# Patient Record
Sex: Female | Born: 1945 | Race: Black or African American | Hispanic: No | Marital: Single | Smoking: Never smoker
Health system: Southern US, Community
[De-identification: ages and names within clinical notes are randomized; demographics above are authoritative.]

## PROBLEM LIST (undated history)

## (undated) ENCOUNTER — Ambulatory Visit: Source: Home / Self Care

## (undated) DIAGNOSIS — I2089 Other forms of angina pectoris: Secondary | ICD-10-CM

## (undated) DIAGNOSIS — M797 Fibromyalgia: Secondary | ICD-10-CM

## (undated) DIAGNOSIS — IMO0001 Reserved for inherently not codable concepts without codable children: Secondary | ICD-10-CM

## (undated) DIAGNOSIS — T8859XA Other complications of anesthesia, initial encounter: Secondary | ICD-10-CM

## (undated) DIAGNOSIS — G8929 Other chronic pain: Secondary | ICD-10-CM

## (undated) DIAGNOSIS — E039 Hypothyroidism, unspecified: Secondary | ICD-10-CM

## (undated) DIAGNOSIS — E785 Hyperlipidemia, unspecified: Secondary | ICD-10-CM

## (undated) DIAGNOSIS — K279 Peptic ulcer, site unspecified, unspecified as acute or chronic, without hemorrhage or perforation: Secondary | ICD-10-CM

## (undated) DIAGNOSIS — K5790 Diverticulosis of intestine, part unspecified, without perforation or abscess without bleeding: Secondary | ICD-10-CM

## (undated) DIAGNOSIS — E119 Type 2 diabetes mellitus without complications: Secondary | ICD-10-CM

## (undated) DIAGNOSIS — M545 Low back pain, unspecified: Secondary | ICD-10-CM

## (undated) DIAGNOSIS — C50512 Malignant neoplasm of lower-outer quadrant of left female breast: Secondary | ICD-10-CM

## (undated) DIAGNOSIS — M940 Chondrocostal junction syndrome [Tietze]: Secondary | ICD-10-CM

## (undated) DIAGNOSIS — F419 Anxiety disorder, unspecified: Secondary | ICD-10-CM

## (undated) DIAGNOSIS — T4145XA Adverse effect of unspecified anesthetic, initial encounter: Secondary | ICD-10-CM

## (undated) DIAGNOSIS — Z9221 Personal history of antineoplastic chemotherapy: Secondary | ICD-10-CM

## (undated) DIAGNOSIS — F50819 Binge eating disorder, unspecified: Secondary | ICD-10-CM

## (undated) DIAGNOSIS — R7303 Prediabetes: Secondary | ICD-10-CM

## (undated) DIAGNOSIS — I208 Other forms of angina pectoris: Secondary | ICD-10-CM

## (undated) DIAGNOSIS — I10 Essential (primary) hypertension: Secondary | ICD-10-CM

## (undated) DIAGNOSIS — K219 Gastro-esophageal reflux disease without esophagitis: Secondary | ICD-10-CM

## (undated) DIAGNOSIS — F32A Depression, unspecified: Secondary | ICD-10-CM

## (undated) DIAGNOSIS — C50911 Malignant neoplasm of unspecified site of right female breast: Secondary | ICD-10-CM

## (undated) DIAGNOSIS — F5081 Binge eating disorder: Secondary | ICD-10-CM

## (undated) DIAGNOSIS — R232 Flushing: Secondary | ICD-10-CM

## (undated) DIAGNOSIS — M1990 Unspecified osteoarthritis, unspecified site: Secondary | ICD-10-CM

## (undated) DIAGNOSIS — J42 Unspecified chronic bronchitis: Secondary | ICD-10-CM

## (undated) DIAGNOSIS — F329 Major depressive disorder, single episode, unspecified: Secondary | ICD-10-CM

## (undated) HISTORY — DX: Major depressive disorder, single episode, unspecified: F32.9

## (undated) HISTORY — PX: CARDIAC CATHETERIZATION: SHX172

## (undated) HISTORY — PX: ABDOMINAL HYSTERECTOMY: SHX81

## (undated) HISTORY — DX: Malignant neoplasm of lower-outer quadrant of left female breast: C50.512

## (undated) HISTORY — PX: TONSILLECTOMY: SUR1361

## (undated) HISTORY — DX: Hypothyroidism, unspecified: E03.9

## (undated) HISTORY — DX: Peptic ulcer, site unspecified, unspecified as acute or chronic, without hemorrhage or perforation: K27.9

## (undated) HISTORY — DX: Binge eating disorder: F50.81

## (undated) HISTORY — DX: Fibromyalgia: M79.7

## (undated) HISTORY — DX: Depression, unspecified: F32.A

## (undated) HISTORY — DX: Unspecified osteoarthritis, unspecified site: M19.90

## (undated) HISTORY — DX: Binge eating disorder, unspecified: F50.819

## (undated) HISTORY — DX: Essential (primary) hypertension: I10

## (undated) HISTORY — PX: LAPAROSCOPIC CHOLECYSTECTOMY: SUR755

## (undated) HISTORY — PX: COLONOSCOPY: SHX174

## (undated) HISTORY — DX: Flushing: R23.2

## (undated) HISTORY — DX: Chondrocostal junction syndrome (tietze): M94.0

## (undated) HISTORY — PX: SHOULDER ARTHROSCOPY: SHX128

## (undated) HISTORY — DX: Gastro-esophageal reflux disease without esophagitis: K21.9

## (undated) HISTORY — DX: Other forms of angina pectoris: I20.89

## (undated) HISTORY — PX: CATARACT EXTRACTION, BILATERAL: SHX1313

## (undated) HISTORY — PX: DILATION AND CURETTAGE OF UTERUS: SHX78

## (undated) HISTORY — DX: Hyperlipidemia, unspecified: E78.5

## (undated) HISTORY — DX: Anxiety disorder, unspecified: F41.9

## (undated) HISTORY — PX: FRACTURE SURGERY: SHX138

## (undated) HISTORY — PX: CARPAL TUNNEL RELEASE: SHX101

## (undated) HISTORY — DX: Diverticulosis of intestine, part unspecified, without perforation or abscess without bleeding: K57.90

## (undated) HISTORY — PX: KNEE ARTHROSCOPY: SHX127

## (undated) HISTORY — DX: Other forms of angina pectoris: I20.8

---

## 1997-09-17 ENCOUNTER — Emergency Department (HOSPITAL_COMMUNITY): Admission: EM | Admit: 1997-09-17 | Discharge: 1997-09-17 | Payer: Self-pay | Admitting: Internal Medicine

## 1998-01-14 ENCOUNTER — Emergency Department (HOSPITAL_COMMUNITY): Admission: EM | Admit: 1998-01-14 | Discharge: 1998-01-15 | Payer: Self-pay | Admitting: Emergency Medicine

## 1998-01-15 ENCOUNTER — Encounter: Payer: Self-pay | Admitting: Emergency Medicine

## 1998-02-11 ENCOUNTER — Emergency Department (HOSPITAL_COMMUNITY): Admission: EM | Admit: 1998-02-11 | Discharge: 1998-02-11 | Payer: Self-pay | Admitting: Emergency Medicine

## 1998-02-15 ENCOUNTER — Encounter: Payer: Self-pay | Admitting: Emergency Medicine

## 1998-02-15 ENCOUNTER — Emergency Department (HOSPITAL_COMMUNITY): Admission: EM | Admit: 1998-02-15 | Discharge: 1998-02-15 | Payer: Self-pay | Admitting: Emergency Medicine

## 1998-06-22 ENCOUNTER — Emergency Department (HOSPITAL_COMMUNITY): Admission: EM | Admit: 1998-06-22 | Discharge: 1998-06-22 | Payer: Self-pay | Admitting: Emergency Medicine

## 1998-07-28 ENCOUNTER — Emergency Department (HOSPITAL_COMMUNITY): Admission: EM | Admit: 1998-07-28 | Discharge: 1998-07-29 | Payer: Self-pay | Admitting: Emergency Medicine

## 1998-07-29 ENCOUNTER — Encounter: Payer: Self-pay | Admitting: Emergency Medicine

## 1998-09-08 ENCOUNTER — Encounter: Payer: Self-pay | Admitting: Cardiovascular Disease

## 1998-09-08 ENCOUNTER — Inpatient Hospital Stay (HOSPITAL_COMMUNITY): Admission: EM | Admit: 1998-09-08 | Discharge: 1998-09-09 | Payer: Self-pay | Admitting: Emergency Medicine

## 1998-11-15 ENCOUNTER — Encounter: Payer: Self-pay | Admitting: Emergency Medicine

## 1998-11-15 ENCOUNTER — Inpatient Hospital Stay (HOSPITAL_COMMUNITY): Admission: EM | Admit: 1998-11-15 | Discharge: 1998-11-17 | Payer: Self-pay | Admitting: Emergency Medicine

## 1999-03-25 ENCOUNTER — Emergency Department (HOSPITAL_COMMUNITY): Admission: EM | Admit: 1999-03-25 | Discharge: 1999-03-26 | Payer: Self-pay | Admitting: Emergency Medicine

## 1999-03-25 ENCOUNTER — Encounter: Payer: Self-pay | Admitting: Emergency Medicine

## 1999-03-29 ENCOUNTER — Emergency Department (HOSPITAL_COMMUNITY): Admission: EM | Admit: 1999-03-29 | Discharge: 1999-03-29 | Payer: Self-pay

## 1999-04-12 ENCOUNTER — Emergency Department (HOSPITAL_COMMUNITY): Admission: EM | Admit: 1999-04-12 | Discharge: 1999-04-12 | Payer: Self-pay

## 1999-05-27 ENCOUNTER — Encounter: Payer: Self-pay | Admitting: Internal Medicine

## 1999-05-27 ENCOUNTER — Ambulatory Visit (HOSPITAL_COMMUNITY): Admission: RE | Admit: 1999-05-27 | Discharge: 1999-05-27 | Payer: Self-pay | Admitting: Internal Medicine

## 1999-07-06 ENCOUNTER — Emergency Department (HOSPITAL_COMMUNITY): Admission: EM | Admit: 1999-07-06 | Discharge: 1999-07-06 | Payer: Self-pay

## 1999-07-31 ENCOUNTER — Encounter: Payer: Self-pay | Admitting: Emergency Medicine

## 1999-08-01 ENCOUNTER — Observation Stay (HOSPITAL_COMMUNITY): Admission: EM | Admit: 1999-08-01 | Discharge: 1999-08-02 | Payer: Self-pay | Admitting: Cardiovascular Disease

## 1999-09-26 ENCOUNTER — Emergency Department (HOSPITAL_COMMUNITY): Admission: EM | Admit: 1999-09-26 | Discharge: 1999-09-26 | Payer: Self-pay | Admitting: Emergency Medicine

## 1999-11-21 ENCOUNTER — Emergency Department (HOSPITAL_COMMUNITY): Admission: EM | Admit: 1999-11-21 | Discharge: 1999-11-21 | Payer: Self-pay | Admitting: Emergency Medicine

## 1999-11-21 ENCOUNTER — Encounter: Payer: Self-pay | Admitting: Emergency Medicine

## 1999-12-14 ENCOUNTER — Encounter: Payer: Self-pay | Admitting: Internal Medicine

## 1999-12-14 ENCOUNTER — Emergency Department (HOSPITAL_COMMUNITY): Admission: EM | Admit: 1999-12-14 | Discharge: 1999-12-15 | Payer: Self-pay | Admitting: Emergency Medicine

## 2000-03-07 ENCOUNTER — Observation Stay (HOSPITAL_COMMUNITY): Admission: EM | Admit: 2000-03-07 | Discharge: 2000-03-08 | Payer: Self-pay | Admitting: Emergency Medicine

## 2000-03-07 ENCOUNTER — Encounter: Payer: Self-pay | Admitting: Emergency Medicine

## 2000-03-08 ENCOUNTER — Encounter: Payer: Self-pay | Admitting: Cardiology

## 2000-03-22 ENCOUNTER — Ambulatory Visit (HOSPITAL_COMMUNITY): Admission: RE | Admit: 2000-03-22 | Discharge: 2000-03-22 | Payer: Self-pay | Admitting: Cardiology

## 2000-03-22 ENCOUNTER — Encounter: Payer: Self-pay | Admitting: Cardiology

## 2000-04-08 ENCOUNTER — Encounter: Payer: Self-pay | Admitting: Emergency Medicine

## 2000-04-08 ENCOUNTER — Emergency Department (HOSPITAL_COMMUNITY): Admission: EM | Admit: 2000-04-08 | Discharge: 2000-04-08 | Payer: Self-pay | Admitting: Emergency Medicine

## 2000-04-24 ENCOUNTER — Emergency Department (HOSPITAL_COMMUNITY): Admission: EM | Admit: 2000-04-24 | Discharge: 2000-04-24 | Payer: Self-pay | Admitting: Emergency Medicine

## 2000-05-04 ENCOUNTER — Emergency Department (HOSPITAL_COMMUNITY): Admission: EM | Admit: 2000-05-04 | Discharge: 2000-05-05 | Payer: Self-pay | Admitting: Emergency Medicine

## 2000-05-05 ENCOUNTER — Encounter: Payer: Self-pay | Admitting: Emergency Medicine

## 2000-05-07 ENCOUNTER — Inpatient Hospital Stay (HOSPITAL_COMMUNITY): Admission: EM | Admit: 2000-05-07 | Discharge: 2000-05-08 | Payer: Self-pay | Admitting: Emergency Medicine

## 2000-05-07 ENCOUNTER — Encounter (INDEPENDENT_AMBULATORY_CARE_PROVIDER_SITE_OTHER): Payer: Self-pay | Admitting: Specialist

## 2000-05-07 ENCOUNTER — Encounter (HOSPITAL_BASED_OUTPATIENT_CLINIC_OR_DEPARTMENT_OTHER): Payer: Self-pay | Admitting: General Surgery

## 2000-05-29 ENCOUNTER — Encounter: Payer: Self-pay | Admitting: Emergency Medicine

## 2000-05-29 ENCOUNTER — Emergency Department (HOSPITAL_COMMUNITY): Admission: EM | Admit: 2000-05-29 | Discharge: 2000-05-29 | Payer: Self-pay | Admitting: *Deleted

## 2000-06-13 ENCOUNTER — Encounter: Payer: Self-pay | Admitting: Emergency Medicine

## 2000-06-13 ENCOUNTER — Emergency Department (HOSPITAL_COMMUNITY): Admission: EM | Admit: 2000-06-13 | Discharge: 2000-06-13 | Payer: Self-pay | Admitting: Emergency Medicine

## 2000-07-10 ENCOUNTER — Encounter: Payer: Self-pay | Admitting: Emergency Medicine

## 2000-07-10 ENCOUNTER — Emergency Department (HOSPITAL_COMMUNITY): Admission: EM | Admit: 2000-07-10 | Discharge: 2000-07-10 | Payer: Self-pay | Admitting: Emergency Medicine

## 2000-08-09 ENCOUNTER — Inpatient Hospital Stay (HOSPITAL_COMMUNITY): Admission: EM | Admit: 2000-08-09 | Discharge: 2000-08-11 | Payer: Self-pay | Admitting: Emergency Medicine

## 2000-08-09 ENCOUNTER — Encounter: Payer: Self-pay | Admitting: Emergency Medicine

## 2000-10-17 ENCOUNTER — Emergency Department (HOSPITAL_COMMUNITY): Admission: EM | Admit: 2000-10-17 | Discharge: 2000-10-18 | Payer: Self-pay | Admitting: Emergency Medicine

## 2000-10-27 ENCOUNTER — Emergency Department (HOSPITAL_COMMUNITY): Admission: EM | Admit: 2000-10-27 | Discharge: 2000-10-28 | Payer: Self-pay | Admitting: Emergency Medicine

## 2000-10-27 ENCOUNTER — Encounter: Payer: Self-pay | Admitting: Emergency Medicine

## 2000-12-12 ENCOUNTER — Emergency Department (HOSPITAL_COMMUNITY): Admission: EM | Admit: 2000-12-12 | Discharge: 2000-12-12 | Payer: Self-pay | Admitting: Emergency Medicine

## 2001-01-05 ENCOUNTER — Emergency Department (HOSPITAL_COMMUNITY): Admission: EM | Admit: 2001-01-05 | Discharge: 2001-01-06 | Payer: Self-pay | Admitting: Emergency Medicine

## 2001-01-16 ENCOUNTER — Emergency Department (HOSPITAL_COMMUNITY): Admission: EM | Admit: 2001-01-16 | Discharge: 2001-01-16 | Payer: Self-pay | Admitting: Emergency Medicine

## 2001-01-27 ENCOUNTER — Emergency Department (HOSPITAL_COMMUNITY): Admission: EM | Admit: 2001-01-27 | Discharge: 2001-01-27 | Payer: Self-pay | Admitting: Emergency Medicine

## 2001-02-24 ENCOUNTER — Encounter: Payer: Self-pay | Admitting: Emergency Medicine

## 2001-02-24 ENCOUNTER — Emergency Department (HOSPITAL_COMMUNITY): Admission: EM | Admit: 2001-02-24 | Discharge: 2001-02-24 | Payer: Self-pay | Admitting: Emergency Medicine

## 2001-03-23 ENCOUNTER — Observation Stay (HOSPITAL_COMMUNITY): Admission: EM | Admit: 2001-03-23 | Discharge: 2001-03-24 | Payer: Self-pay | Admitting: Emergency Medicine

## 2001-03-23 ENCOUNTER — Encounter: Payer: Self-pay | Admitting: Emergency Medicine

## 2001-07-03 ENCOUNTER — Encounter: Payer: Self-pay | Admitting: Emergency Medicine

## 2001-07-03 ENCOUNTER — Emergency Department (HOSPITAL_COMMUNITY): Admission: EM | Admit: 2001-07-03 | Discharge: 2001-07-03 | Payer: Self-pay | Admitting: Emergency Medicine

## 2001-07-08 ENCOUNTER — Emergency Department (HOSPITAL_COMMUNITY): Admission: EM | Admit: 2001-07-08 | Discharge: 2001-07-09 | Payer: Self-pay | Admitting: Emergency Medicine

## 2001-07-09 ENCOUNTER — Encounter: Payer: Self-pay | Admitting: Emergency Medicine

## 2001-12-09 ENCOUNTER — Emergency Department (HOSPITAL_COMMUNITY): Admission: EM | Admit: 2001-12-09 | Discharge: 2001-12-09 | Payer: Self-pay | Admitting: Emergency Medicine

## 2001-12-09 ENCOUNTER — Encounter: Payer: Self-pay | Admitting: Emergency Medicine

## 2002-03-09 ENCOUNTER — Emergency Department (HOSPITAL_COMMUNITY): Admission: EM | Admit: 2002-03-09 | Discharge: 2002-03-09 | Payer: Self-pay | Admitting: Emergency Medicine

## 2002-03-14 ENCOUNTER — Emergency Department (HOSPITAL_COMMUNITY): Admission: EM | Admit: 2002-03-14 | Discharge: 2002-03-14 | Payer: Self-pay | Admitting: *Deleted

## 2002-03-30 ENCOUNTER — Inpatient Hospital Stay (HOSPITAL_COMMUNITY): Admission: EM | Admit: 2002-03-30 | Discharge: 2002-04-01 | Payer: Self-pay | Admitting: Psychiatry

## 2002-06-09 ENCOUNTER — Emergency Department (HOSPITAL_COMMUNITY): Admission: EM | Admit: 2002-06-09 | Discharge: 2002-06-09 | Payer: Self-pay | Admitting: Emergency Medicine

## 2002-06-09 ENCOUNTER — Encounter: Payer: Self-pay | Admitting: Emergency Medicine

## 2002-06-29 ENCOUNTER — Emergency Department (HOSPITAL_COMMUNITY): Admission: EM | Admit: 2002-06-29 | Discharge: 2002-06-29 | Payer: Self-pay | Admitting: Emergency Medicine

## 2002-06-29 ENCOUNTER — Encounter: Payer: Self-pay | Admitting: Emergency Medicine

## 2002-08-04 ENCOUNTER — Emergency Department (HOSPITAL_COMMUNITY): Admission: EM | Admit: 2002-08-04 | Discharge: 2002-08-04 | Payer: Self-pay

## 2002-09-05 ENCOUNTER — Encounter: Payer: Self-pay | Admitting: Pediatrics

## 2002-09-05 ENCOUNTER — Emergency Department (HOSPITAL_COMMUNITY): Admission: EM | Admit: 2002-09-05 | Discharge: 2002-09-05 | Payer: Self-pay | Admitting: Emergency Medicine

## 2002-11-25 ENCOUNTER — Emergency Department (HOSPITAL_COMMUNITY): Admission: EM | Admit: 2002-11-25 | Discharge: 2002-11-25 | Payer: Self-pay | Admitting: Emergency Medicine

## 2003-01-20 ENCOUNTER — Encounter: Payer: Self-pay | Admitting: Cardiology

## 2003-01-20 ENCOUNTER — Ambulatory Visit (HOSPITAL_COMMUNITY): Admission: RE | Admit: 2003-01-20 | Discharge: 2003-01-20 | Payer: Self-pay | Admitting: Cardiology

## 2003-01-28 ENCOUNTER — Ambulatory Visit (HOSPITAL_COMMUNITY): Admission: RE | Admit: 2003-01-28 | Discharge: 2003-01-28 | Payer: Self-pay | Admitting: Cardiology

## 2003-05-09 ENCOUNTER — Emergency Department (HOSPITAL_COMMUNITY): Admission: EM | Admit: 2003-05-09 | Discharge: 2003-05-09 | Payer: Self-pay | Admitting: Emergency Medicine

## 2003-06-19 ENCOUNTER — Emergency Department (HOSPITAL_COMMUNITY): Admission: EM | Admit: 2003-06-19 | Discharge: 2003-06-19 | Payer: Self-pay | Admitting: Emergency Medicine

## 2003-08-08 ENCOUNTER — Emergency Department (HOSPITAL_COMMUNITY): Admission: EM | Admit: 2003-08-08 | Discharge: 2003-08-08 | Payer: Self-pay | Admitting: Emergency Medicine

## 2003-08-10 ENCOUNTER — Emergency Department (HOSPITAL_COMMUNITY): Admission: EM | Admit: 2003-08-10 | Discharge: 2003-08-10 | Payer: Self-pay | Admitting: Family Medicine

## 2003-09-24 ENCOUNTER — Emergency Department (HOSPITAL_COMMUNITY): Admission: EM | Admit: 2003-09-24 | Discharge: 2003-09-24 | Payer: Self-pay | Admitting: Family Medicine

## 2004-03-07 ENCOUNTER — Emergency Department (HOSPITAL_COMMUNITY): Admission: EM | Admit: 2004-03-07 | Discharge: 2004-03-07 | Payer: Self-pay | Admitting: Family Medicine

## 2004-04-17 ENCOUNTER — Emergency Department (HOSPITAL_COMMUNITY): Admission: EM | Admit: 2004-04-17 | Discharge: 2004-04-17 | Payer: Self-pay | Admitting: Family Medicine

## 2004-05-14 ENCOUNTER — Emergency Department (HOSPITAL_COMMUNITY): Admission: EM | Admit: 2004-05-14 | Discharge: 2004-05-14 | Payer: Self-pay | Admitting: Emergency Medicine

## 2004-05-16 ENCOUNTER — Emergency Department (HOSPITAL_COMMUNITY): Admission: EM | Admit: 2004-05-16 | Discharge: 2004-05-16 | Payer: Self-pay | Admitting: Family Medicine

## 2004-05-20 ENCOUNTER — Emergency Department (HOSPITAL_COMMUNITY): Admission: EM | Admit: 2004-05-20 | Discharge: 2004-05-21 | Payer: Self-pay | Admitting: Emergency Medicine

## 2004-06-22 ENCOUNTER — Emergency Department (HOSPITAL_COMMUNITY): Admission: EM | Admit: 2004-06-22 | Discharge: 2004-06-22 | Payer: Self-pay | Admitting: Family Medicine

## 2004-07-20 ENCOUNTER — Encounter: Admission: RE | Admit: 2004-07-20 | Discharge: 2004-07-20 | Payer: Self-pay | Admitting: Internal Medicine

## 2004-12-13 ENCOUNTER — Emergency Department (HOSPITAL_COMMUNITY): Admission: EM | Admit: 2004-12-13 | Discharge: 2004-12-14 | Payer: Self-pay | Admitting: Emergency Medicine

## 2005-02-16 ENCOUNTER — Emergency Department (HOSPITAL_COMMUNITY): Admission: EM | Admit: 2005-02-16 | Discharge: 2005-02-16 | Payer: Self-pay | Admitting: Emergency Medicine

## 2005-06-27 ENCOUNTER — Emergency Department (HOSPITAL_COMMUNITY): Admission: EM | Admit: 2005-06-27 | Discharge: 2005-06-28 | Payer: Self-pay | Admitting: Emergency Medicine

## 2005-07-16 ENCOUNTER — Emergency Department (HOSPITAL_COMMUNITY): Admission: EM | Admit: 2005-07-16 | Discharge: 2005-07-16 | Payer: Self-pay | Admitting: Emergency Medicine

## 2005-08-07 ENCOUNTER — Ambulatory Visit (HOSPITAL_COMMUNITY): Admission: RE | Admit: 2005-08-07 | Discharge: 2005-08-07 | Payer: Self-pay

## 2005-08-14 ENCOUNTER — Ambulatory Visit (HOSPITAL_COMMUNITY): Admission: RE | Admit: 2005-08-14 | Discharge: 2005-08-14 | Payer: Self-pay | Admitting: Anesthesiology

## 2006-02-22 ENCOUNTER — Encounter: Admission: RE | Admit: 2006-02-22 | Discharge: 2006-02-22 | Payer: Self-pay | Admitting: Internal Medicine

## 2006-05-20 ENCOUNTER — Emergency Department (HOSPITAL_COMMUNITY): Admission: EM | Admit: 2006-05-20 | Discharge: 2006-05-20 | Payer: Self-pay | Admitting: Emergency Medicine

## 2006-09-13 ENCOUNTER — Ambulatory Visit (HOSPITAL_COMMUNITY): Admission: RE | Admit: 2006-09-13 | Discharge: 2006-09-13 | Payer: Self-pay | Admitting: Cardiology

## 2006-10-22 ENCOUNTER — Emergency Department (HOSPITAL_COMMUNITY): Admission: EM | Admit: 2006-10-22 | Discharge: 2006-10-22 | Payer: Self-pay | Admitting: Emergency Medicine

## 2006-10-23 ENCOUNTER — Emergency Department (HOSPITAL_COMMUNITY): Admission: EM | Admit: 2006-10-23 | Discharge: 2006-10-23 | Payer: Self-pay | Admitting: Emergency Medicine

## 2007-02-28 ENCOUNTER — Ambulatory Visit (HOSPITAL_COMMUNITY): Admission: RE | Admit: 2007-02-28 | Discharge: 2007-02-28 | Payer: Self-pay | Admitting: Anesthesiology

## 2007-04-10 HISTORY — PX: ANKLE FRACTURE SURGERY: SHX122

## 2007-04-13 ENCOUNTER — Emergency Department (HOSPITAL_COMMUNITY): Admission: EM | Admit: 2007-04-13 | Discharge: 2007-04-13 | Payer: Self-pay | Admitting: *Deleted

## 2007-07-24 ENCOUNTER — Emergency Department (HOSPITAL_COMMUNITY): Admission: EM | Admit: 2007-07-24 | Discharge: 2007-07-24 | Payer: Self-pay | Admitting: Emergency Medicine

## 2007-12-15 ENCOUNTER — Emergency Department (HOSPITAL_COMMUNITY): Admission: EM | Admit: 2007-12-15 | Discharge: 2007-12-16 | Payer: Self-pay | Admitting: *Deleted

## 2008-07-24 ENCOUNTER — Emergency Department (HOSPITAL_COMMUNITY): Admission: EM | Admit: 2008-07-24 | Discharge: 2008-07-24 | Payer: Self-pay | Admitting: Emergency Medicine

## 2008-09-24 ENCOUNTER — Emergency Department (HOSPITAL_COMMUNITY): Admission: EM | Admit: 2008-09-24 | Discharge: 2008-09-24 | Payer: Self-pay | Admitting: Family Medicine

## 2008-11-11 ENCOUNTER — Emergency Department (HOSPITAL_COMMUNITY): Admission: EM | Admit: 2008-11-11 | Discharge: 2008-11-11 | Payer: Self-pay | Admitting: Emergency Medicine

## 2009-02-10 ENCOUNTER — Emergency Department (HOSPITAL_COMMUNITY): Admission: EM | Admit: 2009-02-10 | Discharge: 2009-02-10 | Payer: Self-pay | Admitting: Emergency Medicine

## 2009-02-14 ENCOUNTER — Emergency Department (HOSPITAL_COMMUNITY): Admission: EM | Admit: 2009-02-14 | Discharge: 2009-02-14 | Payer: Self-pay | Admitting: Emergency Medicine

## 2009-04-11 ENCOUNTER — Emergency Department (HOSPITAL_COMMUNITY): Admission: EM | Admit: 2009-04-11 | Discharge: 2009-04-12 | Payer: Self-pay | Admitting: Emergency Medicine

## 2009-05-14 ENCOUNTER — Emergency Department (HOSPITAL_COMMUNITY): Admission: EM | Admit: 2009-05-14 | Discharge: 2009-05-15 | Payer: Self-pay | Admitting: Emergency Medicine

## 2009-11-07 ENCOUNTER — Inpatient Hospital Stay (HOSPITAL_COMMUNITY): Admission: EM | Admit: 2009-11-07 | Discharge: 2009-11-15 | Payer: Self-pay | Admitting: Emergency Medicine

## 2009-11-07 ENCOUNTER — Ambulatory Visit: Payer: Self-pay | Admitting: Internal Medicine

## 2010-01-19 ENCOUNTER — Ambulatory Visit: Payer: Self-pay | Admitting: Internal Medicine

## 2010-01-19 DIAGNOSIS — M1990 Unspecified osteoarthritis, unspecified site: Secondary | ICD-10-CM | POA: Insufficient documentation

## 2010-01-19 DIAGNOSIS — E785 Hyperlipidemia, unspecified: Secondary | ICD-10-CM

## 2010-01-19 DIAGNOSIS — F329 Major depressive disorder, single episode, unspecified: Secondary | ICD-10-CM

## 2010-01-19 DIAGNOSIS — K219 Gastro-esophageal reflux disease without esophagitis: Secondary | ICD-10-CM

## 2010-01-19 DIAGNOSIS — K279 Peptic ulcer, site unspecified, unspecified as acute or chronic, without hemorrhage or perforation: Secondary | ICD-10-CM | POA: Insufficient documentation

## 2010-01-19 DIAGNOSIS — I10 Essential (primary) hypertension: Secondary | ICD-10-CM | POA: Insufficient documentation

## 2010-01-19 DIAGNOSIS — E1169 Type 2 diabetes mellitus with other specified complication: Secondary | ICD-10-CM

## 2010-01-19 DIAGNOSIS — E039 Hypothyroidism, unspecified: Secondary | ICD-10-CM

## 2010-01-19 DIAGNOSIS — M545 Low back pain: Secondary | ICD-10-CM | POA: Insufficient documentation

## 2010-01-19 LAB — CONVERTED CEMR LAB
ALT: 21 units/L (ref 0–35)
AST: 25 units/L (ref 0–37)
Alkaline Phosphatase: 108 units/L (ref 39–117)
BUN: 11 mg/dL (ref 6–23)
Basophils Relative: 0.6 % (ref 0.0–3.0)
CO2: 30 meq/L (ref 19–32)
Calcium: 10.1 mg/dL (ref 8.4–10.5)
Chloride: 106 meq/L (ref 96–112)
Cholesterol: 113 mg/dL (ref 0–200)
Creatinine, Ser: 0.9 mg/dL (ref 0.4–1.2)
Eosinophils Relative: 2.2 % (ref 0.0–5.0)
GFR calc non Af Amer: 80.04 mL/min (ref >60)
Glucose, Bld: 106 mg/dL — ABNORMAL HIGH (ref 70–99)
Lymphocytes Relative: 49.2 % — ABNORMAL HIGH (ref 12.0–46.0)
MCV: 94.1 fL (ref 78.0–100.0)
Monocytes Relative: 6.6 % (ref 3.0–12.0)
Neutrophils Relative %: 41.4 % — ABNORMAL LOW (ref 43.0–77.0)
Platelets: 318 10*3/uL (ref 150.0–400.0)
Potassium: 5.3 meq/L — ABNORMAL HIGH (ref 3.5–5.1)
RDW: 14.5 % (ref 11.5–14.6)
Total Protein: 7 g/dL (ref 6.0–8.3)

## 2010-04-04 ENCOUNTER — Ambulatory Visit
Admission: RE | Admit: 2010-04-04 | Discharge: 2010-04-04 | Payer: Self-pay | Source: Home / Self Care | Attending: Family Medicine | Admitting: Family Medicine

## 2010-04-04 ENCOUNTER — Ambulatory Visit: Admission: RE | Admit: 2010-04-04 | Payer: Self-pay | Source: Home / Self Care | Admitting: Family Medicine

## 2010-04-04 DIAGNOSIS — J019 Acute sinusitis, unspecified: Secondary | ICD-10-CM | POA: Insufficient documentation

## 2010-04-11 ENCOUNTER — Ambulatory Visit
Admission: RE | Admit: 2010-04-11 | Discharge: 2010-04-11 | Payer: Self-pay | Source: Home / Self Care | Attending: Family Medicine | Admitting: Family Medicine

## 2010-04-11 DIAGNOSIS — J45909 Unspecified asthma, uncomplicated: Secondary | ICD-10-CM | POA: Insufficient documentation

## 2010-04-11 DIAGNOSIS — J45998 Other asthma: Secondary | ICD-10-CM | POA: Insufficient documentation

## 2010-04-11 DIAGNOSIS — R42 Dizziness and giddiness: Secondary | ICD-10-CM | POA: Insufficient documentation

## 2010-04-20 ENCOUNTER — Ambulatory Visit: Admit: 2010-04-20 | Payer: Self-pay | Admitting: Internal Medicine

## 2010-04-24 ENCOUNTER — Encounter: Payer: Self-pay | Admitting: Gastroenterology

## 2010-04-25 ENCOUNTER — Ambulatory Visit
Admission: RE | Admit: 2010-04-25 | Discharge: 2010-04-25 | Payer: Self-pay | Source: Home / Self Care | Attending: Family Medicine | Admitting: Family Medicine

## 2010-04-25 DIAGNOSIS — N39498 Other specified urinary incontinence: Secondary | ICD-10-CM | POA: Insufficient documentation

## 2010-04-28 ENCOUNTER — Telehealth: Payer: Self-pay | Admitting: Gastroenterology

## 2010-05-01 ENCOUNTER — Encounter
Admission: RE | Admit: 2010-05-01 | Discharge: 2010-05-01 | Payer: Self-pay | Source: Home / Self Care | Attending: Family Medicine | Admitting: Family Medicine

## 2010-05-09 NOTE — Assessment & Plan Note (Signed)
Summary: to be est/njr   Vital Signs:  Patient profile:   65 year old female Weight:      218 pounds Temp:     98.1 degrees F oral BP sitting:   110 / 80  (right arm) Cuff size:   regular  Vitals Entered By: Duard Brady LPN (January 19, 2010 1:08 PM) CC: new to establish - needs PCP - doing ok Is Patient Diabetic? No   CC:  new to establish - needs PCP - doing ok.  History of Present Illness: 65 year old patient who is seen today to establish with our practice.  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  She has treated hypertension, dyslipidemia, and a history of depression.  She has gastroesophageal reflux disease and hypothyroidism.  She is followed by rheumatology due to fibromyalgia.  Medical regimen includes oxycodone. Family history is positive in first degree relatives for colon cancer and breast cancer, but she has had no recent screening tests Here for Medicare AWV:  1.   Risk factors based on Past M, S, F history:  cardiovascular risk factors include hypertension, and dyslipidemia 2.   Physical Activities: partially limited due to a fracture, right ankle 3.   Depression/mood: history of major depression, followed by psychiatry 4.   Hearing: no deficits 5.   ADL's: independent in all aspects of daily living 6.   Fall Risk: moderate 7.   Home Safety: no proms identified 8.   Height, weight, &visual acuity:height weight, in visual acuity, stable 9.   Counseling:screening colonoscopy and mammogram.  Encouraged 10.   Labs ordered based on risk factors: will perform screening lab today 11.           Referral Coordination- followed cardiology rheumatology, and psychiatry 12.           Care Plan- heart healthy diet regular exercise and modest weight loss.  All encouraged 13.            Cognitive Assessment-  alert and oriented, with normal affect.  No history of memory disturbance.   Able to handle all executive functioning   Preventive Screening-Counseling & Management  Alcohol-Tobacco     Smoking Status: never  Allergies (verified): 1)  ! Codeine 2)  ! 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 father died in his 89s, complications of alcoholism, phenol, dementia.  Apparent history of colon cancer mother died age 65.  Neurovascular disease, senile dementia of the Alzheimer's type;  died of complications of diverticular disease in a nursing home   two brother- One history of alcoholism and deceased 4 sisters, one deceased of breast cancer  Social History: Reviewed history and no changes required. Divorced Never Smoked 3 children, one grandchild presently  lives with her adult daughter, who has OCD Smoking Status:  never  Review of Systems       The patient complains of weight gain, muscle weakness, difficulty walking, depression, and unusual weight change.  The patient denies anorexia, fever, weight loss, vision loss, decreased hearing, hoarseness, chest pain, syncope, dyspnea on exertion, peripheral edema, prolonged cough, headaches, hemoptysis, abdominal pain, melena, hematochezia, severe indigestion/heartburn, hematuria, incontinence, genital sores, suspicious skin lesions, transient blindness, abnormal  bleeding, enlarged lymph nodes, angioedema, and breast masses.    Physical Exam  General:  overweight-appearing.  120/80 Head:  Normocephalic and atraumatic without obvious abnormalities. No apparent alopecia or balding. Eyes:  No corneal or conjunctival inflammation noted. EOMI. Perrla. Funduscopic exam benign,  without hemorrhages, exudates or papilledema. Vision grossly normal. Ears:  External ear exam shows no significant lesions or deformities.  Otoscopic examination reveals clear canals, tympanic membranes are intact bilaterally without bulging, retraction, inflammation or discharge. Hearing is grossly normal bilaterally. Nose:  External nasal examination shows no deformity or inflammation. Nasal mucosa are pink and moist without lesions or exudates. Mouth:  Oral mucosa and oropharynx without lesions or exudates.  Teeth in good repair. Neck:  No deformities, masses, or tenderness noted. Chest Wall:  No deformities, masses, or tenderness noted. Lungs:  Normal respiratory effort, chest expands symmetrically. Lungs are clear to auscultation, no crackles or wheezes. Heart:  Normal rate and regular rhythm. S1 and S2 normal without gallop, murmur, click, rub or other extra sounds. Abdomen:  obese soft, nontender, no organomegaly Msk:  No deformity or scoliosis noted of thoracic or lumbar spine.   Pulses:  left pedal pulses intact Extremities:  right lower leg and ankle  in  a soft cast  Skin:  Intact without suspicious lesions or rashes Cervical Nodes:  No lymphadenopathy noted Axillary Nodes:  No palpable lymphadenopathy Psych:  Cognition and judgment appear intact. Alert and cooperative with normal attention span and concentration. No apparent delusions, illusions, hallucinations   Impression & Recommendations:  Problem # 1:  PREVENTIVE HEALTH CARE (ICD-V70.0)  Orders: Medicare -1st Annual Wellness Visit [phone])  Problem # 2:  OSTEOARTHRITIS (ICD-715.90)  Her updated medication list for this problem includes:    Oxycodone-acetaminophen 5-325 Mg Tabs (Oxycodone-acetaminophen) .Marland Kitchen... 1 by mouth q6hrs as needed pain    Aspir-low 81 Mg Tbec (Aspirin) ..... Qd  Orders: TLB-BMP (Basic Metabolic Panel-BMET) (80048-METABOL) TLB-CBC Platelet - w/Differential (85025-CBCD) TLB-Hepatic/Liver Function  Pnl (80076-HEPATIC) Specimen Handling (63016)  Problem # 3:  HYPOTHYROIDISM (ICD-244.9)  Her updated medication list for this problem includes:    Levothroid 50 Mcg Tabs (Levothyroxine sodium) ..... One daily  Orders: Venipuncture (01093) TLB-BMP (Basic Metabolic Panel-BMET) (80048-METABOL) TLB-CBC Platelet - w/Differential (85025-CBCD) TLB-Hepatic/Liver Function Pnl (80076-HEPATIC) TLB-TSH (Thyroid Stimulating Hormone) (84443-TSH) Specimen Handling (23557)  Problem # 4:  DEPRESSION (ICD-311)  Her updated medication list for this problem includes:    Xanax 0.5 Mg Tabs (Alprazolam) .Marland Kitchen... 1 bid    Citalopram Hydrobromide 20 Mg Tabs (Citalopram hydrobromide) .Marland Kitchen... 1 by mouth two times a day  Problem # 5:  HYPERTENSION (ICD-401.9)  Her updated medication list for this problem includes:    Exforge 10-320 Mg Tabs (Amlodipine besylate-valsartan) .Marland Kitchen... 1/2 qam  Orders: TLB-BMP (Basic Metabolic Panel-BMET) (80048-METABOL) TLB-CBC Platelet - w/Differential (85025-CBCD) TLB-Hepatic/Liver Function Pnl (80076-HEPATIC) Specimen Handling (32202)  Problem # 6:  HYPERLIPIDEMIA (ICD-272.4)  Her updated medication list for this problem includes:    Lipitor 40 Mg Tabs (Atorvastatin calcium) .Marland Kitchen... 1/2 qhs  Orders: TLB-Cholesterol, Total (82465-CHO) Specimen Handling (54270)  Complete Medication List: 1)  Exforge 10-320 Mg Tabs (Amlodipine besylate-valsartan) .... 1/2 qam 2)  Metroprolol 25mg   .... 1 by by mouth bid 3)  Lipitor 40 Mg Tabs (Atorvastatin calcium) .... 1/2 qhs 4)  Xanax 0.5 Mg Tabs (Alprazolam) .Marland Kitchen.. 1 bid 5)  Lamotrigine 100 Mg Tabs (Lamotrigine) .Marland Kitchen.. 1 by mouth qhs 6)  Citalopram Hydrobromide 20 Mg Tabs (Citalopram hydrobromide) .Marland Kitchen.. 1 by mouth two times a day  7)  Oxycodone-acetaminophen 5-325 Mg Tabs (Oxycodone-acetaminophen) .Marland Kitchen.. 1 by mouth q6hrs as needed pain 8)  Flexeril 5 Mg Tabs (Cyclobenzaprine hcl) .Marland Kitchen.. 1 by mouth two times a day as needed mucsle spasms 9)   Aspir-low 81 Mg Tbec (Aspirin) .... Qd 10)  Nitrostat 0.4 Mg Subl (Nitroglycerin) .... As needed chest pain 11)  Levothroid 50 Mcg Tabs (Levothyroxine sodium) .... One daily  Patient Instructions: 1)  Please schedule a follow-up appointment in 3 months. 2)  Limit your Sodium (Salt). 3)  It is important that you exercise regularly at least 20 minutes 5 times a week. If you develop chest pain, have severe difficulty breathing, or feel very tired , stop exercising immediately and seek medical attention. 4)  You need to lose weight. Consider a lower calorie diet and regular exercise.  5)  Schedule your mammogram. 6)  Schedule a colonoscopy/sigmoidoscopy to help detect colon cancer. 7)  Take an Aspirin every day. 8)  Check your Blood Pressure regularly. If it is above: 150/90 you should make an appointment. Prescriptions: LEVOTHROID 50 MCG TABS (LEVOTHYROXINE SODIUM) one daily  #90 x 6   Entered and Authorized by:   Gordy Savers  MD   Signed by:   Gordy Savers  MD on 01/19/2010   Method used:   Print then Give to Patient   RxID:   [id number] NITROSTAT 0.4 MG SUBL (NITROGLYCERIN) as needed chest pain  #bottle x 6   Entered and Authorized by:   Gordy Savers  MD   Signed by:   Gordy Savers  MD on 01/19/2010   Method used:   Print then Give to Patient   RxID:   [id number] LIPITOR 40 MG TABS (ATORVASTATIN CALCIUM) 1/2 qhs  #90 x 5   Entered and Authorized by:   Gordy Savers  MD   Signed by:   Gordy Savers  MD on 01/19/2010   Method used:   Print then Give to Patient   RxID:   [id number] METROPROLOL 25MG  1 by by mouth bid  #180 x 4   Entered and Authorized by:   Gordy Savers  MD   Signed by:   Gordy Savers  MD on 01/19/2010   Method used:   Print then Give to Patient   RxID:   [id number] EXFORGE 10-320 MG TABS (AMLODIPINE BESYLATE-VALSARTAN) 1/2 qAM  #90 x 4   Entered and Authorized by:   Gordy Savers  MD    Signed by:   Gordy Savers  MD on 01/19/2010   Method used:   Print then Give to Patient   RxID:   [id number]

## 2010-05-09 NOTE — Progress Notes (Signed)
Summary: List of Medications provided by patient  List of Medications provided by patient   Imported By: Maryln Gottron 01/24/2010 10:13:10  _____________________________________________________________________  External Attachment:    Type:   Image     Comment:   External Document

## 2010-05-11 NOTE — Assessment & Plan Note (Signed)
Summary: CONGESTION/CB   Vital Signs:  Patient profile:   65 year old female Temp:     98.6 degrees F oral BP sitting:   92 / 52  (left arm) Cuff size:   regular  Vitals Entered By: Sid Falcon LPN (April 04, 2010 1:59 PM)  History of Present Illness: Here for 2 weeks of sinus pressure, PND, ST , and a dry cough. No fever. Using Mucinex.  Allergies: 1)  ! Codeine 2)  ! Adhesive Tape  Past History:  Past Medical History: Reviewed history from 01/19/2010 and no changes required. 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    Physical Exam  General:  Well-developed,well-nourished,in no acute distress; alert,appropriate and cooperative throughout examination Head:  Normocephalic and atraumatic without obvious abnormalities. No apparent alopecia or balding. Eyes:  No corneal or conjunctival inflammation noted. EOMI. Perrla. Funduscopic exam benign, without hemorrhages, exudates or papilledema. Vision grossly normal. Ears:  External ear exam shows no significant lesions or deformities.  Otoscopic examination reveals clear canals, tympanic membranes are intact bilaterally without bulging, retraction, inflammation or discharge. Hearing is grossly normal bilaterally. Nose:  External nasal examination shows no deformity or inflammation. Nasal  mucosa are pink and moist without lesions or exudates. Mouth:  Oral mucosa and oropharynx without lesions or exudates.  Teeth in good repair. Neck:  No deformities, masses, or tenderness noted. Lungs:  Normal respiratory effort, chest expands symmetrically. Lungs are clear to auscultation, no crackles or wheezes.   Impression & Recommendations:  Problem # 1:  ACUTE SINUSITIS, UNSPECIFIED (ICD-461.9)  Her updated medication list for this problem includes:    Zithromax Z-pak 250 Mg Tabs (Azithromycin) .Marland Kitchen... As directed  Complete Medication List: 1)  Exforge 10-320 Mg Tabs (Amlodipine besylate-valsartan) .... 1/2 qam 2)  Metroprolol 25mg   .... 1 by by mouth bid 3)  Lipitor 40 Mg Tabs (Atorvastatin calcium) .... 1/2 qhs 4)  Xanax 0.5 Mg Tabs (Alprazolam) .Marland Kitchen.. 1 bid 5)  Lamotrigine 100 Mg Tabs (Lamotrigine) .Marland Kitchen.. 1 by mouth qhs 6)  Citalopram Hydrobromide 20 Mg Tabs (Citalopram hydrobromide) .Marland Kitchen.. 1 by mouth two times a day 7)  Oxycodone-acetaminophen 5-325 Mg Tabs (Oxycodone-acetaminophen) .Marland Kitchen.. 1 by mouth q6hrs as needed pain 8)  Flexeril 5 Mg Tabs (Cyclobenzaprine hcl) .Marland Kitchen.. 1 by mouth two times a day as needed mucsle spasms 9)  Aspir-low 81 Mg Tbec (Aspirin) .... Qd 10)  Nitrostat 0.4 Mg Subl (Nitroglycerin) .... As needed chest pain 11)  Levothroid 50 Mcg Tabs (Levothyroxine sodium) .... One daily 12)  Zithromax Z-pak 250 Mg Tabs (Azithromycin) .... As directed  Patient Instructions: 1)  Please schedule a follow-up appointment as needed .  Prescriptions: ZITHROMAX Z-PAK 250 MG TABS (AZITHROMYCIN) as directed  #1 x 0   Entered and Authorized by:   Nelwyn Salisbury MD   Signed by:   Nelwyn Salisbury MD on 04/04/2010   Method used:   [facility]ronically to        [phone][facility] (retail)       [address]  [place], [place]  [address]       Ph: [phone]       Fax: [phone]   RxID:   [id number]    Orders Added: 1)  Est. Patient Level IV  [87564]

## 2010-05-11 NOTE — Assessment & Plan Note (Signed)
Summary: NEW TO EST//PH   Vital Signs:  Patient profile:   65 year old female Height:      66 inches Weight:      213 pounds BMI:     34.50 O2 Sat:      98 % on Room air Temp:     98.8 degrees F oral Pulse rate:   90 / minute BP sitting:   124 / 78  (right arm)  Vitals Entered By: Doristine Devoid CMA (April 11, 2010 3:22 PM)  O2 Flow:  Room air CC: NEW EST- having dizziness has stopped lamotrigine and citalopram 3 wks    History of Present Illness: 65 yo woman here today to establish care.  Previous MD- Dr Kirtland Bouchard (1 visit)  1) Depression- was taking Citalopram and Lamictal up until 3 weeks ago.  pt reports she weaned off meds over the span of 3 weeks.  had been on meds for over a year.  pt reports having withdrawal sxs- pt c/o having visual issues 'the image jumps' when turning her head quickly.  sxs worsen w/ movement.  no hx of vertigo.  'i stay kind of drunk'.  reports sxs just started in the last week.  similar to previous withdrawal sxs from SSRI.  didn't feel that meds were 'doing me any good'.  denies crying spells, SI/HI.  does have some guilt and sadness, 'but that's just me.  the medicine didn't stop it'.  was previously in therapy and left things on a as needed basis.  had an 'awakening' this summer during a church service.  'all of my issues are gone now.  i am free from the person i used to be'.  2) health maintainence- overdue on pap, mammo, colonoscopy.  Preventive Screening-Counseling & Management  Alcohol-Tobacco     Alcohol drinks/day: 0     Smoking Status: never  Caffeine-Diet-Exercise     Does Patient Exercise: no      Drug Use:  never.    Current Medications (verified): 1)  Exforge 10-320 Mg Tabs (Amlodipine Besylate-Valsartan) .... 1/2 Qam 2)  Metroprolol 25mg  .... 1 By By Mouth Bid 3)  Lipitor 40 Mg Tabs (Atorvastatin Calcium) .... 1/2 Qhs 4)  Xanax 0.5 Mg Tabs (Alprazolam) .Marland Kitchen.. 1 Bid 5)  Oxycodone-Acetaminophen 5-325 Mg Tabs (Oxycodone-Acetaminophen) .Marland Kitchen..  1 By Mouth Q6hrs As Needed Pain 6)  Flexeril 5 Mg Tabs (Cyclobenzaprine Hcl) .Marland Kitchen.. 1 By Mouth Two Times A Day As Needed Mucsle Spasms 7)  Aspir-Low 81 Mg Tbec (Aspirin) .... Qd 8)  Nitrostat 0.4 Mg Subl (Nitroglycerin) .... As Needed Chest Pain 9)  Levothroid 50 Mcg Tabs (Levothyroxine Sodium) .... One Daily  Allergies (verified): 1)  ! Codeine 2)  ! Adhesive Tape  Past History:  Past medical, surgical, family and social histories (including risk factors) reviewed, and no changes noted (except as noted below).  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 father died in his 109s, complications of alcoholism, phenol, dementia.  Apparent history of colon cancer mother died age 61.  Neurovascular disease, senile dementia of the Alzheimer's type;  died of complications of diverticular disease in a nursing home   two brother- One history of alcoholism and deceased 4 sisters, one deceased  of breast cancer  Social History: Reviewed history from 01/19/2010 and no changes required. 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 No apparent alopecia or balding. Eyes:  2 beats of horizontal nystagmus Neck:  No deformities, masses, or  tenderness noted. Lungs:  Normal respiratory effort, chest expands symmetrically. Lungs are clear to auscultation, no crackles or wheezes. Heart:  Normal rate and regular rhythm. S1 and S2 normal without gallop, murmur, click, rub or other extra sounds. Abdomen:  obese soft, nontender, no organomegaly Pulses:  +2 carotid, radial, DP Extremities:  no C/C/E Neurologic:  No cranial nerve deficits noted. Station and gait are normal. Plantar reflexes are down-going bilaterally. DTRs are symmetrical throughout. Sensory, motor and coordinative functions appear intact. Skin:  turgor normal and color normal.   Cervical Nodes:  No lymphadenopathy noted Psych:  Cognition and judgment appear intact. Alert and cooperative with normal attention span and concentration. No apparent delusions, illusions, hallucinations   Impression & Recommendations:  Problem # 1:  DEPRESSION (ICD-311) Assessment Unchanged pt has stopped citalopram and lamictal after having a religious awakening this summer.  truly seems to be sxs free at this time.  feels she is having withdraw sxs based on previous experience but hx and PE seems more consistent w/ vertigo.  total time spent w/ pt- 32 minutes, >50% spent counseling. The following medications were removed from the medication list:    Citalopram Hydrobromide 20 Mg Tabs (Citalopram hydrobromide) .Marland Kitchen... 1 by mouth two times a day Her updated medication list for this problem includes:    Xanax 0.5 Mg Tabs (Alprazolam) .Marland Kitchen... 1 bid  Problem # 2:  VERTIGO (ICD-780.4) Assessment: New  start Meclizine as needed.  neuro exam WNL. Her updated medication list for this problem includes:    Meclizine Hcl 25 Mg Tabs (Meclizine hcl) .Marland Kitchen... 1 by mouth 3 times a day as needed vertigo  Orders: Prescription Created Electronically [phone])  Complete Medication List: 1)  Exforge 10-320 Mg Tabs (Amlodipine besylate-valsartan) .... 1/2 qam 2)  Metroprolol 25mg   .... 1 by by mouth bid 3)   Lipitor 40 Mg Tabs (Atorvastatin calcium) .... 1/2 qhs 4)  Xanax 0.5 Mg Tabs (Alprazolam) .Marland Kitchen.. 1 bid 5)  Oxycodone-acetaminophen 5-325 Mg Tabs (Oxycodone-acetaminophen) .Marland Kitchen.. 1 by mouth q6hrs as needed pain 6)  Flexeril 5 Mg Tabs (Cyclobenzaprine hcl) .Marland Kitchen.. 1 by mouth two times a day as needed mucsle spasms 7)  Aspir-low 81 Mg Tbec (Aspirin) .... Qd 8)  Nitrostat 0.4 Mg Subl (Nitroglycerin) .... As needed chest pain 9)  Levothroid 50 Mcg Tabs (Levothyroxine sodium) .... One daily 10)  Meclizine Hcl 25 Mg Tabs (Meclizine hcl) .Marland Kitchen.. 1 by mouth 3 times a day as needed vertigo  Other Orders: Gastroenterology Referral (GI) Radiology Referral (Radiology)  Patient Instructions: 1)  Please schedule your pap and breast exam at your convenience 2)  Someone will call you with your mammogram and colonoscopy consultation appts 3)  Use the Meclizine as needed for your dizziness- this will improve w/ time 4)  Drink plenty of fluids- this will help your dizziness 5)  Call with any questions or concerns 6)  Welcome!  We're glad to have you! Prescriptions: MECLIZINE  HCL 25 MG TABS (MECLIZINE HCL) 1 by mouth 3 times a day as needed vertigo  #45 x 1   Entered and Authorized by:   Neena Rhymes MD   Signed by:   Neena Rhymes MD on 04/11/2010   Method used:   [facility] to        [facility] [phone][facility] (retail)       [address]       [place], [place]  [address]       Ph: [phone]       Fax: [phone]   RxID:   [id number]    Orders Added: 1)  Gastroenterology Referral [GI] 2)  Radiology Referral [Radiology] 3)  Est. Patient Level IV [34742] 4)  Prescription Created Electronically [phone]

## 2010-05-11 NOTE — Progress Notes (Signed)
Summary: Previsit paperwork  Phone Note Call from Patient Call back at Home Phone [phone]   Caller: Patient Call For: Dr. Jarold Motto Reason for Call: Talk to Nurse Summary of Call: Calling about previsit paperwork Initial call taken by: Karna Christmas,  April 28, 2010 11:14 AM  Follow-up for Phone Call        broke her ankle in july and had surgery 11/08/2009, she had problems while being sedated she bronchial spasms, she was given a breathing treatment before her next surgery. I advised her we would not be using general sedation. She did have a colonoopy 20 years ago for family hx of diverticulitis. Follow-up by: Harlow Mares CMA (AAMA),  April 28, 2010 11:30 AM

## 2010-05-11 NOTE — Assessment & Plan Note (Signed)
Summary: PAP ONLY//PH   Vital Signs:  Patient profile:   65 year old female Weight:      215 pounds BMI:     34.83 BP sitting:   124 / 80  (left arm)  Vitals Entered By: Doristine Devoid CMA (April 25, 2010 8:41 AM) CC: vaginal exam    History of Present Illness: 65 yo woman here today for GYN exam.  reports stress incontinence w/ cough since URI a few weeks ago.  no hx of previous.  no complaints.  Current Medications (verified): 1)  Exforge 10-320 Mg Tabs (Amlodipine Besylate-Valsartan) .... 1/2 Qam 2)  Metroprolol 25mg  .... 1 By By Mouth Bid 3)  Lipitor 40 Mg Tabs (Atorvastatin Calcium) .... 1/2 Qhs 4)  Xanax 0.5 Mg Tabs (Alprazolam) .Marland Kitchen.. 1 Bid 5)  Oxycodone-Acetaminophen 5-325 Mg Tabs (Oxycodone-Acetaminophen) .Marland Kitchen.. 1 By Mouth Q6hrs As Needed Pain 6)  Flexeril 5 Mg Tabs (Cyclobenzaprine Hcl) .Marland Kitchen.. 1 By Mouth Two Times A Day As Needed Mucsle Spasms 7)  Aspir-Low 81 Mg Tbec (Aspirin) .... Qd 8)  Nitrostat 0.4 Mg Subl (Nitroglycerin) .... As Needed Chest Pain 9)  Levothroid 50 Mcg Tabs (Levothyroxine Sodium) .... One Daily 10)  Meclizine Hcl 25 Mg Tabs (Meclizine Hcl) .Marland Kitchen.. 1 By Mouth 3 Times A Day As Needed Vertigo 11)  Tramadol Hcl 50 Mg Tabs (Tramadol Hcl) .... Take One Tablet Every 6 Hours As Needed  Allergies (verified): 1)  ! Codeine 2)  ! Adhesive Tape  Past History:  Past medical, surgical, family and social histories (including risk factors) reviewed for relevance to current acute and chronic problems.  Past Medical History: Reviewed history from 04/11/2010 and no changes required. 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 Cholecystectomy Hysterectomy Tonsillectomy heart catheterization x 2, 1998 2001 Arthroscopic x2 bilateral Shoulder surgery    Broken ankle  Family History: Reviewed history from 01/19/2010 and no changes required. father died in his 53s, complications of alcoholism, phenol, dementia.  Apparent history of colon cancer mother died age 40.  Neurovascular disease, senile dementia of the Alzheimer's type;  died of complications of diverticular disease in a nursing home   two brother- One history of alcoholism and deceased 4 sisters, one deceased of breast cancer  Social History: Reviewed history from 04/11/2010 and no changes required. 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   Rectal:  no external abnormalities and no hemorrhoids.  normal perineum. Genitalia:  normal introitus, no external lesions, no vaginal discharge, and mucosa pink and moist.  no vaginal lesions.  mild atrophy.  s/p hysterectomy.  no pap collected.   Impression & Recommendations:  Problem # 1:  ROUTINE GYNECOLOGICAL EXAMINATION (ICD-V72.31) Assessment New  breast and GYN exams WNL.  no concerns identified.  Orders: Pelvic & Breast Exam ( Medicare)  (G0101)  Problem # 2:  STRESS INCONTINENCE (ICD-788.39) Assessment: New given that pt had no sxs prior to URI and now having leaking w/ cough encouraged her to work on Kegel's to strengthen pelvic floor muscles.  if no improvement will refer  to urology.  Complete Medication List: 1)  Exforge 10-320 Mg Tabs (Amlodipine besylate-valsartan) .... 1/2 qam 2)  Metroprolol 25mg   .... 1 by by mouth bid 3)  Lipitor 40 Mg Tabs (Atorvastatin calcium) .... 1/2 qhs 4)  Xanax 0.5 Mg Tabs (Alprazolam) .Marland Kitchen.. 1  bid 5)  Oxycodone-acetaminophen 5-325 Mg Tabs (Oxycodone-acetaminophen) .Marland Kitchen.. 1 by mouth q6hrs as needed pain 6)  Flexeril 5 Mg Tabs (Cyclobenzaprine hcl) .Marland Kitchen.. 1 by mouth two times a day as needed mucsle spasms 7)  Aspir-low 81 Mg Tbec (Aspirin) .... Qd 8)  Nitrostat 0.4 Mg Subl (Nitroglycerin) .... As needed chest pain 9)  Levothroid 50 Mcg Tabs (Levothyroxine sodium) .... One daily 10)  Meclizine Hcl 25 Mg Tabs (Meclizine hcl) .Marland Kitchen.. 1 by mouth 3 times a day as needed vertigo 11)  Tramadol Hcl 50 Mg Tabs (Tramadol hcl) .... Take one tablet every 6 hours as needed  Patient Instructions: 1)  Follow up in April to recheck your cholesterol and blood pressure- do not eat before this appt 2)  Your exam looks fine- no need for concern 3)  Work on your Kegel exercises- if you are still having stress incontinence after 4-6 weeks, please call me 4)  Have a great week!   Orders Added: 1)  Pelvic & Breast Exam ( Medicare)  [G0101] 2)  Est. Patient Level III [14782]  Appended Document: PAP ONLY//PH PE: GU- pt's cervix is missing as she had a hysterectomy and adnexa are empty due to BSO.  urethral meatus was normal in appearance.

## 2010-05-11 NOTE — Letter (Signed)
Summary: Pre Visit Letter Revised  [facility]  [address] [place], [place] [address]   Phone: [phone]  Fax: [phone]        04/24/2010 MRN: [id number]  Tracey Evans [address] [place], [place]  [address]             Procedure Date:  05-31-10 10am           Direct Colon - Dr Tomasita Morrow to the Gastroenterology Division at [facility]    You are scheduled to see a nurse for your pre-procedure visit on 05-17-10 at 11am on the 3rd floor at [facility], [address]  We ask that you try to arrive at our office 15 minutes prior to your appointment time to allow for check-in.  Please take a minute to review the attached form.  If you answer "Yes" to one or more of the questions on the first page, we ask that you call the person listed at your earliest opportunity.  If you answer "No" to all of the questions, please complete the rest of the form and bring it to your appointment.    Your nurse visit will consist of discussing your medical and surgical history, your immediate family medical history, and your medications.   If you are unable to list all of your medications on the form, please bring the medication bottles to your appointment and we will list them.  We will need to be aware of both prescribed and over the counter drugs.  We will need to know exact dosage information as well.    Please be prepared to read and sign documents such as consent forms, a financial agreement, and acknowledgement forms.  If necessary, and with your consent, a friend or relative is welcome to sit-in on the nurse visit with you.  Please bring your insurance card so that we may make a copy of it.  If your insurance requires a referral to see a specialist, please bring your referral form from your primary care physician.  No co-pay is required for this nurse visit.     If you cannot keep your appointment, please call [phone] to cancel or  reschedule prior to your appointment date.  This allows [place] the opportunity to schedule an appointment for another patient in need of care.    Thank you for choosing [facility] for your medical needs.  We appreciate the opportunity to care for you.  Please visit [place] at our website  to learn more about our practice.  Sincerely, The Gastroenterology Division

## 2010-05-19 ENCOUNTER — Encounter (INDEPENDENT_AMBULATORY_CARE_PROVIDER_SITE_OTHER): Payer: Self-pay | Admitting: *Deleted

## 2010-05-22 ENCOUNTER — Encounter: Payer: Self-pay | Admitting: Gastroenterology

## 2010-05-31 ENCOUNTER — Other Ambulatory Visit (AMBULATORY_SURGERY_CENTER): Payer: Medicare Other | Admitting: Gastroenterology

## 2010-05-31 ENCOUNTER — Encounter: Payer: Self-pay | Admitting: Gastroenterology

## 2010-05-31 DIAGNOSIS — Z800 Family history of malignant neoplasm of digestive organs: Secondary | ICD-10-CM

## 2010-05-31 DIAGNOSIS — Z1211 Encounter for screening for malignant neoplasm of colon: Secondary | ICD-10-CM

## 2010-05-31 DIAGNOSIS — K5730 Diverticulosis of large intestine without perforation or abscess without bleeding: Secondary | ICD-10-CM

## 2010-05-31 LAB — HM COLONOSCOPY

## 2010-05-31 NOTE — Miscellaneous (Addendum)
Summary: previsit prep/rm  Clinical Lists Changes  Medications: Added new medication of MOVIPREP 100 GM  SOLR (PEG-KCL-NACL-NASULF-NA ASC-C) As per prep instructions. - Signed Rx of MOVIPREP 100 GM  SOLR (PEG-KCL-NACL-NASULF-NA ASC-C) As per prep instructions.;  #1 x 0;  Signed;  Entered by: Sherren Kerns RN;  Authorized by: Mardella Layman MD [facility];  Method used: [facility] [phone]*, [address], [place], [place], [place]  [address], Ph: [phone], Fax: [phone] Allergies: Added new allergy or adverse reaction of CODEINE Added new allergy or adverse reaction of * LATEX Observations: Added new observation of NKA: F (05/22/2010 8:57)    Prescriptions: MOVIPREP 100 GM  SOLR (PEG-KCL-NACL-NASULF-NA ASC-C) As per prep instructions.  #1 x 0   Entered by:   Sherren Kerns RN   Authorized by:   Mardella Layman MD [facility]   Signed by:   Sherren Kerns RN on 05/22/2010   Method used:   Electronically to        [facility] [phone]* (retail)       [address]       [place], [place]  [address]       Ph: [phone]       Fax: [phone]   RxID:   [id number]

## 2010-05-31 NOTE — Letter (Addendum)
Summary: [facility] Instructions  [facility]  [address] [place], [place] [address]   Phone: [phone]  Fax: [phone]       Tracey Evans    June 08, 1945    MRN: [id number]        Procedure Day Dorna Bloom:  Wednesday 05/31/2010     Arrival Time: 9:00 am     Procedure Time: 10:00 am     Location of Procedure:                    _ x_  [facility] (4th Floor)                        PREPARATION FOR COLONOSCOPY WITH MOVIPREP   Starting 5 days prior to your procedure Friday 2/17 do not eat nuts, seeds, popcorn, corn, beans, peas,  salads, or any raw vegetables.  Do not take any fiber supplements (e.g. Metamucil, Citrucel, and Benefiber).  THE DAY BEFORE YOUR PROCEDURE         DATE: Tuesday 2/21  1.  Drink clear liquids the entire day-NO SOLID FOOD  2.  Do not drink anything colored red or purple.  Avoid juices with pulp.  No orange juice.  3.  Drink at least 64 oz. (8 glasses) of fluid/clear liquids during the day to prevent dehydration and help the prep work efficiently.  CLEAR LIQUIDS INCLUDE: Water Jello Ice Popsicles Tea (sugar ok, no milk/cream) Powdered fruit flavored drinks Coffee (sugar ok, no milk/cream) Gatorade Juice: apple, white grape, white cranberry  Lemonade Clear bullion, consomm, broth Carbonated beverages (any kind) Strained chicken noodle soup Hard Candy                             4.  In the morning, mix first dose of MoviPrep solution:    Empty 1 Pouch A and 1 Pouch B into the disposable container    Add lukewarm drinking water to the top line of the container. Mix to dissolve    Refrigerate (mixed solution should be used within 24 hrs)  5.  Begin drinking the prep at 5:00 p.m. The MoviPrep container is divided by 4 marks.   Every 15 minutes drink the solution down to the next mark (approximately 8 oz) until the full liter is complete.   6.  Follow completed prep with 16 oz of clear liquid of your choice  (Nothing red or purple).  Continue to drink clear liquids until bedtime.  7.  Before going to bed, mix second dose of MoviPrep solution:    Empty 1 Pouch A and 1 Pouch B into the disposable container    Add lukewarm drinking water to the top line of the container. Mix to dissolve    Refrigerate  THE DAY OF YOUR PROCEDURE      DATE: Wednesday 2/22  Beginning at 5:00 a.m. (5 hours before procedure):         1. Every 15 minutes, drink the solution down to the next mark (approx 8 oz) until the full liter is complete.  2. Follow completed prep with 16 oz. of clear liquid of your choice.    3. You may drink clear liquids until 8:00 am (2 HOURS BEFORE PROCEDURE).   MEDICATION INSTRUCTIONS  Unless otherwise instructed, you should take regular prescription medications with a small sip of water   as early as possible the morning of your  procedure.            OTHER INSTRUCTIONS  You will need a responsible adult at least 65 years of age to accompany you and drive you home.   This person must remain in the waiting room during your procedure.  Wear loose fitting clothing that is easily removed.  Leave jewelry and other valuables at home.  However, you may wish to bring a book to read or  an iPod/MP3 player to listen to music as you wait for your procedure to start.  Remove all body piercing jewelry and leave at home.  Total time from sign-in until discharge is approximately 2-3 hours.  You should go home directly after your procedure and rest.  You can resume normal activities the  day after your procedure.  The day of your procedure you should not:   Drive   Make legal decisions   Operate machinery   Drink alcohol   Return to work  You will receive specific instructions about eating, activities and medications before you leave.    The above instructions have been reviewed and explained to me by   Sherren Kerns RN  May 22, 2010 9:22 AM    I fully  understand and can verbalize these instructions _____________________________ Date _________

## 2010-06-02 ENCOUNTER — Ambulatory Visit (INDEPENDENT_AMBULATORY_CARE_PROVIDER_SITE_OTHER): Payer: Medicare Other | Admitting: Family Medicine

## 2010-06-02 ENCOUNTER — Encounter: Payer: Self-pay | Admitting: Family Medicine

## 2010-06-02 DIAGNOSIS — K5730 Diverticulosis of large intestine without perforation or abscess without bleeding: Secondary | ICD-10-CM

## 2010-06-02 DIAGNOSIS — K5731 Diverticulosis of large intestine without perforation or abscess with bleeding: Secondary | ICD-10-CM | POA: Insufficient documentation

## 2010-06-06 NOTE — Assessment & Plan Note (Signed)
Summary: f/u colonoscopy results//fd   Vital Signs:  Patient profile:   65 year old female Weight:      214 pounds BMI:     34.67 Pulse rate:   86 / minute BP sitting:   110 / 70  (left arm)  Vitals Entered By: Doristine Devoid CMA (June 02, 2010 1:24 PM) CC: discuss colonoscopy   History of Present Illness: 65 yo woman here today for discussion on colonoscopy results.  mother died from complications of diverticulitis w/ perforation.  pt now very concerned about her own colonoscopy results.  Current Medications (verified): 1)  Exforge 10-320 Mg Tabs (Amlodipine Besylate-Valsartan) .... 1/2 Qam 2)  Metroprolol 25mg  .... 1 By By Mouth Bid 3)  Lipitor 40 Mg Tabs (Atorvastatin Calcium) .... 1/2 Qhs 4)  Xanax 0.5 Mg Tabs (Alprazolam) .Marland Kitchen.. 1 Bid 5)  Oxycodone-Acetaminophen 5-325 Mg Tabs (Oxycodone-Acetaminophen) .Marland Kitchen.. 1 By Mouth Q6hrs As Needed Pain 6)  Flexeril 5 Mg Tabs (Cyclobenzaprine Hcl) .Marland Kitchen.. 1 By Mouth Two Times A Day As Needed Mucsle Spasms 7)  Aspir-Low 81 Mg Tbec (Aspirin) .... Qd 8)  Nitrostat 0.4 Mg Subl (Nitroglycerin) .... As Needed Chest Pain 9)  Levothroid 50 Mcg Tabs (Levothyroxine Sodium) .... One Daily 10)  Meclizine Hcl 25 Mg Tabs (Meclizine Hcl) .Marland Kitchen.. 1 By Mouth 3 Times A Day As Needed Vertigo 11)  Tramadol Hcl 50 Mg Tabs (Tramadol Hcl) .... Take One Tablet Every 6 Hours As Needed  Allergies (verified): 1)  ! Codeine 2)  ! Codeine 3)  ! * Latex 4)  ! 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  handouts provided for pt regarding dx and dietary recommendations.  also reviewed signs of diverticulitis that should prompt immediate medical attention.  pt felt reassured w/ info provided.  Complete Medication List: 1)  Exforge 10-320 Mg Tabs (Amlodipine besylate-valsartan) .... 1/2 qam 2)  Metroprolol 25mg   .... 1 by by mouth bid 3)  Lipitor 40 Mg Tabs (Atorvastatin calcium) .... 1/2 qhs 4)  Xanax 0.5 Mg Tabs (Alprazolam) .Marland Kitchen.. 1 bid 5)  Oxycodone-acetaminophen 5-325 Mg Tabs (Oxycodone-acetaminophen) .Marland Kitchen.. 1 by mouth q6hrs as needed pain 6)  Flexeril 5 Mg Tabs (Cyclobenzaprine hcl) .Marland Kitchen.. 1 by mouth two times a day as needed mucsle spasms 7)  Aspir-low 81 Mg Tbec (Aspirin) .... Qd 8)  Nitrostat 0.4 Mg Subl (Nitroglycerin) .... As needed chest pain 9)  Levothroid 50 Mcg Tabs (Levothyroxine sodium) .... One daily 10)  Meclizine Hcl 25 Mg Tabs (Meclizine hcl) .Marland Kitchen.. 1 by mouth 3 times a day as needed vertigo 11)  Tramadol Hcl 50 Mg Tabs (Tramadol hcl) .... Take one tablet every 6 hours as needed  Patient Instructions: 1)  Reviewe the info on the handouts and call with any questions or concerns 2)  If you have severe abdominal pain- always call for an appt or go to Urgent Care/ER 3)  Hang in there!!!   Orders Added: 1)  Est. Patient Level III [60454]

## 2010-06-06 NOTE — Procedures (Signed)
Summary: Colonoscopy  Patient: Tracey Evans Note: All result statuses are Final unless otherwise noted.  Tests: (1) Colonoscopy (COL)   COL Colonoscopy           DONE     [facility]     [address]     [place], [place]  [address]           COLONOSCOPY PROCEDURE REPORT           PATIENT:  Tracey Evans, Tracey Evans  MR#:  [id number]     BIRTHDATE:  17-Nov-1945, 64 yrs. old  GENDER:  female     ENDOSCOPIST:  Vania Rea. Jarold Motto, MD, [facility]     REF. BY:  Eleonore Chiquito, M.D.     PROCEDURE DATE:  05/31/2010     PROCEDURE:  Higher-risk screening colonoscopy G0105           ASA CLASS:  Class II     INDICATIONS:  family history of colon cancer     MEDICATIONS:   Fentanyl 100 mcg IV, Versed 10 mg IV           DESCRIPTION OF PROCEDURE:   After the risks benefits and     alternatives of the procedure were thoroughly explained, informed     consent was obtained.  Digital rectal exam was performed and     revealed no abnormalities.   The LB [id number] endoscope     was introduced through the anus and advanced to the cecum, which     was identified by both the appendix and ileocecal valve, without     limitations.  The quality of the prep was excellent, using     MoviPrep.  The instrument was then slowly withdrawn as the colon     was fully examined.     <<PROCEDUREIMAGES>>           FINDINGS:  Severe diverticulosis was found throughout the colon.     No polyps or cancers were seen.   Retroflexed views in the rectum     revealed no abnormalities.    The scope was then withdrawn from     the patient and the procedure completed.           COMPLICATIONS:  None     ENDOSCOPIC IMPRESSION:     1) Severe diverticulosis throughout the colon     2) No polyps or cancers     RECOMMENDATIONS:     1) Given your significant family history of colon cancer, you     should have a repeat colonoscopy in 5 years     2) high fiber diet     REPEAT EXAM:  No        ______________________________     Vania Rea. Jarold Motto, MD, [facility]           CC:           n.     eSIGNED:   Vania Rea. Patterson at 05/31/2010 10:46 AM           Athena Masse, [phone]  Note: An exclamation mark (!) indicates a result that was not dispersed into the flowsheet. Document Creation Date: 05/31/2010 10:46 AM _______________________________________________________________________  (1) Order result status: Final Collection or observation date-time: 05/31/2010 10:41 Requested date-time:  Receipt date-time:  Reported date-time:  Referring Physician:   Ordering Physician: Sheryn Bison [phone]) Specimen Source:  Source: Launa Grill Order Number: [id number] Lab site:   Appended Document: Colonoscopy    Clinical Lists  Changes  Observations: Added new observation of COLONNXTDUE: 05/2015 (05/31/2010 12:52)

## 2010-06-17 ENCOUNTER — Encounter: Payer: Self-pay | Admitting: Family Medicine

## 2010-06-20 ENCOUNTER — Telehealth: Payer: Self-pay | Admitting: Family Medicine

## 2010-06-23 LAB — GLUCOSE, CAPILLARY
Glucose-Capillary: 100 mg/dL — ABNORMAL HIGH (ref 70–99)
Glucose-Capillary: 100 mg/dL — ABNORMAL HIGH (ref 70–99)
Glucose-Capillary: 102 mg/dL — ABNORMAL HIGH (ref 70–99)
Glucose-Capillary: 107 mg/dL — ABNORMAL HIGH (ref 70–99)
Glucose-Capillary: 116 mg/dL — ABNORMAL HIGH (ref 70–99)
Glucose-Capillary: 119 mg/dL — ABNORMAL HIGH (ref 70–99)
Glucose-Capillary: 126 mg/dL — ABNORMAL HIGH (ref 70–99)
Glucose-Capillary: 126 mg/dL — ABNORMAL HIGH (ref 70–99)
Glucose-Capillary: 128 mg/dL — ABNORMAL HIGH (ref 70–99)
Glucose-Capillary: 128 mg/dL — ABNORMAL HIGH (ref 70–99)
Glucose-Capillary: 128 mg/dL — ABNORMAL HIGH (ref 70–99)
Glucose-Capillary: 134 mg/dL — ABNORMAL HIGH (ref 70–99)
Glucose-Capillary: 135 mg/dL — ABNORMAL HIGH (ref 70–99)
Glucose-Capillary: 135 mg/dL — ABNORMAL HIGH (ref 70–99)
Glucose-Capillary: 136 mg/dL — ABNORMAL HIGH (ref 70–99)
Glucose-Capillary: 136 mg/dL — ABNORMAL HIGH (ref 70–99)
Glucose-Capillary: 237 mg/dL — ABNORMAL HIGH (ref 70–99)

## 2010-06-23 LAB — BLOOD GAS, ARTERIAL
Acid-base deficit: 0.3 mmol/L (ref 0.0–2.0)
Bicarbonate: 23.9 mEq/L (ref 20.0–24.0)
Drawn by: 252031
FIO2: 0.4 %
FIO2: 0.5 %
MECHVT: 500 mL
O2 Saturation: 88.8 %
Patient temperature: 98.6
RATE: 12 resp/min
TCO2: 25.1 mmol/L (ref 0–100)
pCO2 arterial: 45.9 mmHg — ABNORMAL HIGH (ref 35.0–45.0)
pH, Arterial: 7.349 — ABNORMAL LOW (ref 7.350–7.400)
pO2, Arterial: 56.6 mmHg — ABNORMAL LOW (ref 80.0–100.0)
pO2, Arterial: 60.7 mmHg — ABNORMAL LOW (ref 80.0–100.0)

## 2010-06-23 LAB — CBC
HCT: 37.2 % (ref 36.0–46.0)
Hemoglobin: 10.5 g/dL — ABNORMAL LOW (ref 12.0–15.0)
MCH: 30 pg (ref 26.0–34.0)
MCHC: 31.7 g/dL (ref 30.0–36.0)
Platelets: 311 10*3/uL (ref 150–400)
Platelets: 336 10*3/uL (ref 150–400)
RBC: 3.5 MIL/uL — ABNORMAL LOW (ref 3.87–5.11)
RBC: 4.06 MIL/uL (ref 3.87–5.11)
RDW: 13.8 % (ref 11.5–15.5)
WBC: 10.7 10*3/uL — ABNORMAL HIGH (ref 4.0–10.5)

## 2010-06-23 LAB — URINALYSIS, ROUTINE W REFLEX MICROSCOPIC
Nitrite: NEGATIVE
Specific Gravity, Urine: 1.022 (ref 1.005–1.030)
Urobilinogen, UA: 1 mg/dL (ref 0.0–1.0)
pH: 5.5 (ref 5.0–8.0)

## 2010-06-23 LAB — APTT: aPTT: 33 seconds (ref 24–37)

## 2010-06-23 LAB — COMPREHENSIVE METABOLIC PANEL
ALT: 45 U/L — ABNORMAL HIGH (ref 0–35)
Albumin: 3.9 g/dL (ref 3.5–5.2)
Alkaline Phosphatase: 68 U/L (ref 39–117)
Chloride: 104 mEq/L (ref 96–112)
Potassium: 3.3 mEq/L — ABNORMAL LOW (ref 3.5–5.1)
Sodium: 141 mEq/L (ref 135–145)
Total Protein: 6.9 g/dL (ref 6.0–8.3)

## 2010-06-23 LAB — HEMOGLOBIN A1C
Hgb A1c MFr Bld: 6.2 % — ABNORMAL HIGH (ref <5.7)
Mean Plasma Glucose: 131 mg/dL — ABNORMAL HIGH (ref <117)

## 2010-06-23 LAB — BASIC METABOLIC PANEL
CO2: 29 mEq/L (ref 19–32)
Calcium: 8.3 mg/dL — ABNORMAL LOW (ref 8.4–10.5)
Creatinine, Ser: 0.81 mg/dL (ref 0.4–1.2)
GFR calc Af Amer: >60 mL/min (ref >60)
GFR calc non Af Amer: >60 mL/min (ref >60)
Sodium: 142 mEq/L (ref 135–145)

## 2010-06-23 LAB — URINE CULTURE
Culture  Setup Time: 201108020826
Special Requests: NEGATIVE

## 2010-06-24 LAB — BASIC METABOLIC PANEL
CO2: 28 mEq/L (ref 19–32)
Chloride: 108 mEq/L (ref 96–112)
GFR calc Af Amer: >60 mL/min (ref >60)
Sodium: 143 mEq/L (ref 135–145)

## 2010-06-24 LAB — CBC
Hemoglobin: 13.1 g/dL (ref 12.0–15.0)
MCHC: 33.9 g/dL (ref 30.0–36.0)
MCV: 94 fL (ref 78.0–100.0)
RBC: 4.12 MIL/uL (ref 3.87–5.11)

## 2010-06-24 LAB — DIFFERENTIAL
Basophils Relative: 0 % (ref 0–1)
Eosinophils Absolute: 0.2 10*3/uL (ref 0.0–0.7)
Monocytes Absolute: 0.8 10*3/uL (ref 0.1–1.0)
Monocytes Relative: 7 % (ref 3–12)

## 2010-06-24 LAB — RAPID URINE DRUG SCREEN, HOSP PERFORMED: Barbiturates: NOT DETECTED

## 2010-06-27 NOTE — Progress Notes (Signed)
Summary: Levothroid (generic) refill  Phone Note Refill Request Message from:  Patient on June 20, 2010 4:13 PM  Refills Requested: Medication #1:  LEVOTHROID 50 MCG TABS one daily   Notes: [id number],  [address] [place], [place]  phone [phone], fax [phone]   qty = 90  Next Appointment Scheduled: 4/17   Lenice Koper Initial call taken by: Jerolyn Shin,  June 20, 2010 4:13 PM    Prescriptions: LEVOTHROID 50 MCG TABS (LEVOTHYROXINE SODIUM) one daily  #90 x 0   Entered by:   Lucious Groves CMA   Authorized by:   Neena Rhymes MD   Signed by:   Lucious Groves CMA on 06/20/2010   Method used:   Electronically to        [facility] [phone]* (retail)       [address]       [place], [place]  [address]       Ph: [phone]       Fax: [phone]   RxID:   [id number]

## 2010-07-12 LAB — URINE MICROSCOPIC-ADD ON

## 2010-07-12 LAB — URINALYSIS, ROUTINE W REFLEX MICROSCOPIC
Bilirubin Urine: NEGATIVE
Hgb urine dipstick: NEGATIVE
Specific Gravity, Urine: 1.025 (ref 1.005–1.030)
Urobilinogen, UA: 1 mg/dL (ref 0.0–1.0)

## 2010-07-15 LAB — COMPREHENSIVE METABOLIC PANEL
ALT: 30 U/L (ref 0–35)
AST: 30 U/L (ref 0–37)
Alkaline Phosphatase: 84 U/L (ref 39–117)
CO2: 29 mEq/L (ref 19–32)
Chloride: 107 mEq/L (ref 96–112)
Creatinine, Ser: 0.91 mg/dL (ref 0.4–1.2)
GFR calc Af Amer: >60 mL/min (ref >60)
GFR calc non Af Amer: >60 mL/min (ref >60)
Potassium: 4.5 mEq/L (ref 3.5–5.1)
Sodium: 140 mEq/L (ref 135–145)
Total Bilirubin: 0.8 mg/dL (ref 0.3–1.2)

## 2010-07-15 LAB — CBC
MCV: 93.8 fL (ref 78.0–100.0)
RBC: 4.03 MIL/uL (ref 3.87–5.11)
WBC: 7.2 10*3/uL (ref 4.0–10.5)

## 2010-07-15 LAB — DIFFERENTIAL
Basophils Absolute: 0 10*3/uL (ref 0.0–0.1)
Basophils Relative: 0 % (ref 0–1)
Eosinophils Absolute: 0.2 10*3/uL (ref 0.0–0.7)
Eosinophils Relative: 2 % (ref 0–5)
Monocytes Absolute: 0.4 10*3/uL (ref 0.1–1.0)

## 2010-07-15 LAB — POCT I-STAT, CHEM 8
Calcium, Ion: 1.22 mmol/L (ref 1.12–1.32)
Hemoglobin: 13.3 g/dL (ref 12.0–15.0)
Sodium: 140 mEq/L (ref 135–145)
TCO2: 25 mmol/L (ref 0–100)

## 2010-07-15 LAB — POCT CARDIAC MARKERS
Myoglobin, poc: 75.2 ng/mL (ref 12–200)
Troponin i, poc: <0.05 ng/mL (ref 0.00–0.09)

## 2010-07-25 ENCOUNTER — Ambulatory Visit (INDEPENDENT_AMBULATORY_CARE_PROVIDER_SITE_OTHER): Payer: Medicare Other | Admitting: Family Medicine

## 2010-07-25 ENCOUNTER — Encounter: Payer: Self-pay | Admitting: Family Medicine

## 2010-07-25 ENCOUNTER — Encounter: Payer: Self-pay | Admitting: *Deleted

## 2010-07-25 DIAGNOSIS — E785 Hyperlipidemia, unspecified: Secondary | ICD-10-CM

## 2010-07-25 DIAGNOSIS — I10 Essential (primary) hypertension: Secondary | ICD-10-CM

## 2010-07-25 DIAGNOSIS — E669 Obesity, unspecified: Secondary | ICD-10-CM | POA: Insufficient documentation

## 2010-07-25 LAB — BASIC METABOLIC PANEL
BUN: 7 mg/dL (ref 6–23)
Chloride: 105 mEq/L (ref 96–112)
Creatinine, Ser: 0.7 mg/dL (ref 0.4–1.2)
GFR: 101.45 mL/min (ref >60.00)

## 2010-07-25 LAB — LIPID PANEL
LDL Cholesterol: 102 mg/dL — ABNORMAL HIGH (ref 0–99)
Total CHOL/HDL Ratio: 4
Triglycerides: 124 mg/dL (ref 0.0–149.0)
VLDL: 24.8 mg/dL (ref 0.0–40.0)

## 2010-07-25 LAB — HEPATIC FUNCTION PANEL
ALT: 17 U/L (ref 0–35)
AST: 19 U/L (ref 0–37)
Bilirubin, Direct: 0.1 mg/dL (ref 0.0–0.3)
Total Bilirubin: 0.9 mg/dL (ref 0.3–1.2)

## 2010-07-25 MED ORDER — ALBUTEROL SULFATE HFA 108 (90 BASE) MCG/ACT IN AERS: 2.0000 | INHALATION_SPRAY | RESPIRATORY_TRACT | Status: DC | PRN

## 2010-07-25 MED ORDER — AMLODIPINE BESYLATE-VALSARTAN 5-160 MG PO TABS: 1.0000 | ORAL_TABLET | Freq: Every day | ORAL | Status: DC

## 2010-07-25 MED ORDER — ATORVASTATIN CALCIUM 20 MG PO TABS: 20.0000 mg | ORAL_TABLET | Freq: Every day | ORAL | Status: DC

## 2010-07-25 NOTE — Assessment & Plan Note (Signed)
Due for labs.  Tolerating statin w/out difficulty.  Pt is tired of breaking pills in 1/2- will change script.  Adjust meds prn.

## 2010-07-25 NOTE — Assessment & Plan Note (Signed)
Pt has been out of meds for a few days.  Will change script so pt no longer has to break pills.  Reviewed the importance of Na avoidance- including her oodles of noodles.  Asymptomatic at this time.  Will follow.

## 2010-07-25 NOTE — Assessment & Plan Note (Signed)
Pt has lost 10 lbs since last visit.  Is now dancing and trying to make healthier food choices.  Applauded her efforts.  Will follow.

## 2010-07-25 NOTE — Progress Notes (Signed)
  Subjective:    Patient ID: Tracey Evans, female    DOB: 25-Jun-1945, 65 y.o.   MRN: [id number]  HPI HTN- eating lots of 'oodles of noodles' which has high Na content.  Is losing weight.  Taking meds regularly.  Denies CP, SOB, HAs, visual changes, edema.  Has been out of meds for 3 days.  Hyperlipidemia- chronic problem for pt, on lipitor.  No abd pain, N/V, myalgias.  Due for labs.  Obesity- has lost 10 lbs since the end of Feb, is now dancing twice daily.  Is trying to make healthy food choices.   Review of Systems For ROS see HPI     Objective:   Physical Exam  Constitutional: She is oriented to person, place, and time. She appears well-developed and well-nourished. No distress.  HENT:  Head: Normocephalic and atraumatic.  Eyes: Conjunctivae and EOM are normal. Pupils are equal, round, and reactive to light.  Neck: Normal range of motion. Neck supple. No thyromegaly present.  Cardiovascular: Normal rate, regular rhythm, normal heart sounds and intact distal pulses.   No murmur heard. Pulmonary/Chest: Effort normal and breath sounds normal. No respiratory distress.  Abdominal: Soft. She exhibits no distension. There is no tenderness.  Musculoskeletal: She exhibits no edema.  Lymphadenopathy:    She has no cervical adenopathy.  Neurological: She is alert and oriented to person, place, and time.  Skin: Skin is warm and dry.  Psychiatric: She has a normal mood and affect. Her behavior is normal.          Assessment & Plan:

## 2010-07-25 NOTE — Patient Instructions (Signed)
Schedule your complete physical in 6 months- do not eat before this appt Take Claritin or Zyrtec for the seasonal allergies (the generic works just as well) We'll notify you of your lab results Call with any questions or concerns You look great!  Keep up the good work! Happy Spring!!!

## 2010-08-25 NOTE — H&P (Signed)
Tracey Evans, Tracey Evans NO.:  [id number]   MEDICAL RECORD NO.:  [id number]                   PATIENT TYPE:  IPS   LOCATION:  0503                                 FACILITY:  [facility]   PHYSICIAN:  Tracey Evans, M.D.              DATE OF BIRTH:  1945-09-16   DATE OF ADMISSION:  03/30/2002  DATE OF DISCHARGE:  04/01/2002                         PSYCHIATRIC ADMISSION ASSESSMENT   IDENTIFYING INFORMATION:  The patient is a 65 year old single African-  American female voluntarily admitted on March 30, 2002.   HISTORY OF PRESENT ILLNESS:  The patient presents with intentional overdose  on Sunday at 3 p.m.  The patient was in her car and took 12 Xanax 0.25 mg  tablets after arguing with her boyfriend.  She was reporting her intention  was to go to sleep and keep her boyfriend from keeping quiet.  Her boyfriend  then took her to mental health.  She stated she had never argued like this  before with him.  She denied it was a suicide attempt.  She reported some  depression.  She stated that she wished she could have done more for her  children in her life.  She had been on Zoloft in the past, she stated, which  was helpful.  She reported some occupational stress.  She reported decreased  sleep with an appetite satisfactory.  She denied any psychotic symptoms.   PAST PSYCHIATRIC HISTORY:  This was the first hospitalization at [facility], no other admissions, no outpatient treatment, no history of a  suicide attempt.   SUBSTANCE ABUSE HISTORY:  She was a nonsmoker.  She denied any alcohol or  substance abuse but reported with that question of alcohol use that she now  she will.   PAST MEDICAL HISTORY:  Primary care Tracey Evans: The patient had no primary  care Tracey Evans, would go to the emergency department for treatment.  She had  a cardiologist, Tracey Evans, M.D.  Medical problems: Angina, asthma,  lactose intolerance, degenerative  arthritis, diverticulitis.   MEDICATIONS:  1. Toprol XL 50 mg.  2. Norvasc 10 mg.  3. Flovent 220 mcg.  4. Nitroglycerin 0.4 mg p.r.n. for chest pain.   DRUG ALLERGIES:  CODEINE, the patient gets nauseated and does not sleep.   PHYSICAL EXAMINATION:  GENERAL:  Physical examination was performed at  [facility] Emergency Department with no significant findings.  The patient  is alert and oriented, in no acute distress at this time.   LABORATORY DATA:  Acetaminophen level was less than 10.  CBC was within  normal limits.  Urine drug screen was positive for benzodiazepines.  CMET  was within normal limits.   SOCIAL HISTORY:  She was a 65 year old single African-American female with  three children ages 19 and 15, twins.  She lived alone.  She had been  retired from the [employer], worked for them for 30 years.  She  worked as a Banker.  She had no legal problems.   FAMILY HISTORY:  None.   MENTAL STATUS EXAM:  She is an alert, middle-aged, overweight female,  dressed in hospital wear.  Speech is soft-spoken.  Mood is angry and  depressed.  Affect: The patient is very flippant initially and defiant, but  then became more agreeable as the interview progressed.  Thought processes  are coherent.  There is no evidence of psychosis, no auditory and visual  hallucinations, no suicidal or homicidal ideation, paranoia, or delusions.  Cognitive: Intact.  Memory is fair.  Judgment and insight are fair.   ADMISSION DIAGNOSES:   AXIS I:  Major depression, single episode.   AXIS II:  Deferred.   AXIS III:  1. Angina.  2. Diverticulitis.  3. Asthma.  4. Lactose intolerance.   AXIS IV:  Problems with primary support group and medical problems.   AXIS V:  Current is 35, this past year is 70-75.   INITIAL PLAN OF CARE:  Plan is a voluntary admission to [facility] for intentional overdose.  Contract for safety, check every 15  minutes.  Will stabilize mood and thinking  so the patient can be safe,  increase coping skills by attending groups and having individual and group  therapy.  Initiate Zoloft to decrease depressive symptoms.  The patient is  to follow up with mental health.   ESTIMATED LENGTH OF STAY:  Three to four days.     Tracey Evans, N.P.                       Tracey Evans, M.D.    JO/MEDQ  D:  04/19/2002  T:  04/19/2002  Job:  [id number]

## 2010-08-25 NOTE — Discharge Summary (Signed)
. [facility]  Patient:    Tracey Evans, Tracey Evans                   MRN: [id number] Adm. Date:  [id number] Disc. Date: [id number] Attending:  Tobey Bride CC:         Osvaldo Shipper. Spruill, M.D.   Discharge Summary  ADMITTING DIAGNOSES: 1. Chest pain, myocardial bridging, rule out coronary insufficiency. 2. Hypertension. 3. Arthritis. 4. Irritable bowel syndrome. 5. Anxiety disorder.  DISCHARGE DIAGNOSES: 1. Status post chest pain mild to moderate, myocardial bridging, negative    stress Cardiolite. 2. Hypertension. 3. Anxiety disorder. 4. Irritable bowel syndrome. 5. History of arthritis.  DISCHARGE HOME MEDICATIONS: 1. Toprol XL 50 mg one tablet in the morning, one-half tablet at night. 2. Imdur 30 mg one tablet daily. 3. Enteric-coated aspirin 325 mg one tablet daily. 4. Prevacid 30 mg one capsule daily. 5. Xanax 0.25 mg one tablet twice daily.  ACTIVITY:  As tolerated.  DIET:  Low salt, low cholesterol.  FOLLOW-UP:  Patient has been advised to follow up with Dr. Osvaldo Shipper. Spruill in one week.  CONDITION ON DISCHARGE:  Stable.  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  She came to the ER from Dr. Osvaldo Shipper. Spruills office complaining of retrosternal chest pressure described as "ton of bricks" rated 8/10 since yesterday morning off and on associated with diaphoresis. She took six to seven sublingual nitroglycerin yesterday and today with partial relief of chest pain.  Patient also gives history of exertional chest pain.  States was on atenolol which was stopped a few months ago due to slow heart rate.  Patient denies any palpitations, light-headedness, or syncope. Denies PND, orthopnea, leg swelling.  Denies fever, chills,  cough.  Denies relation of chest pain to food, breathing, or movement.  Patient does state occasionally radiating on movement.  Patient denies any cough, fever, or chills.  PAST MEDICAL HISTORY:  As above.  PAST SURGICAL HISTORY:  She had arthroscopic knee surgery in 1991 and 1993. She had a hysterectomy in 1984.  She had left cath in June 2000 which showed myocardial bridging in the mid and distal junction of LAD, and subsequently had exercise stress test which was negative.  SOCIAL HISTORY:  She is single, has three children.  No smoking, alcohol abuse, or drug abuse.  She works for [employer] and states whenever she is under stress she gets chest pain.  FAMILY HISTORY:  Father died of cancer at age of 80.  Mother had stroke.  She is hypertensive.  She has four sisters and two brothers all of them are hypertensive.  PHYSICAL EXAMINATION:  GENERAL:  She was alert, awake, and oriented x 3, in no acute distress.  VITAL SIGNS:  Blood pressure was 118/74, pulse of 72 regular.  HEENT:  Conjunctivae was pink.  NECK:  Supple, no JVD, no bruit.  LUNGS:  Clear to auscultation without rhonchi or rales.  CARDIOVASCULAR: S1, S2 was normal, there was no S3 or S4 gallop.  ABDOMEN:  Soft, bowel sounds were present, nontender.  EXTREMITIES:  There was no clubbing, cyanosis, or edema.  LABORATORY DATA:  Her two sets of CPK and two sets of troponin I were negative.  Sodium was 138, potassium 3.4, chloride 103, bicarb 29, glucose 103, BUN 9,  creatinine 0.7.  Repeated electrolytes potassium was 3.7. Hemoglobin was 12.7, hematocrit 35.8, white count of 9.3.  Her cholesterol was 156, LDL of 82, HDL of 46.  HOSPITAL COURSE:  Patient was admitted to telemetry unit.  MI was ruled out by serial enzymes and EKG.  Patient was restarted on beta blockers.  Patient did not have any episodes of chest pain during hospital stay.  Patient underwent stress Cardiolite on March 08, 2000.  She exercised for  7.15 minutes on Bruce protocol.  Patient had episode of chest pain at peak of exercise although EKG did not show any evidence of acute ischemic changes at the peak of exercise.  Peak heart rate was 163.  Peak blood pressure was 140/80.  EKG showed normal sinus rhythm with no acute ischemic changes.  Cardiolite scan showed occasional PVCs were noted during the stress test.  Cardiolite scan showed no evidence of acute ischemia, ejection fraction of 73%.  Patient did not have any episodes of chest pain during the hospital stay.  The patient will be discharged home on the above medications and will be followed up by Dr. Osvaldo Shipper. Spruill in one week. DD:  03/08/00 TD:  03/08/00 Job: [id number]

## 2010-08-25 NOTE — Op Note (Signed)
[facility]  Patient:    Tracey Evans, Tracey Evans                     MRN: [id number] Proc. Date: 05/07/00 Adm. Date:  [id number] Attending:  Donnetta Hutching                           Operative Report  PREOPERATIVE DIAGNOSIS:  Symptomatic cholelithiasis.  POSTOPERATIVE DIAGNOSIS:  Symptomatic cholelithiasis.  OPERATION PERFORMED:  Laparoscopic cholecystectomy with intraoperative cholangiogram.  SURGEON:  Marnee Spring. Wiliam Ke, M.D.  ASSISTANT:  Milus Mallick, M.D.  ANESTHESIA:  Endotracheal by hospital.  DESCRIPTION OF PROCEDURE:  Under adequate endotracheal anesthesia, the skin of the abdomen was prepped and draped in the usual manner.  An incision was made above the umbilicus.  The abdomen was entered.  The pursestring suture was placed.  The Hasson cannula was inserted.  A pneumoperitoneum was obtained. The peritoneal surfaces appeared normal.  The 10 mm and two 5 mm cannulas were placed in the usual fashion.  Dissecting the porta hepatis, the cystic artery was identified.  It was triply clipped and cut leaving two clips proximally. The cystic duct was identified.  It was opened.  The Reddick catheter was inserted.  Cholangiogram was performed using a fluoroscopic technique.  This was normal.  The cystic artery duct was then clipped and cut leaving two clips proximally.  The gallbladder was removed from the liver bed with electrocautery current.  Hemostasis was obtained with electrocautery current. The gallbladder was removed in an Endopouch via the umbilical port in the usual fashion.  The right upper quadrant was copiously irrigated with saline and sucked dry.  Hemostasis was excellent.  The cannula was removed.  The pursestring suture was tied.  The wounds were closed with subcu 4-0 Vicryl and Steri-Strips were applied.  Estimated blood loss was minimal.  The patient received no blood and left the operating room in satisfactory condition after sponge and  needle counts were verified. DD:  05/07/00 TD:  05/08/00 Job: [id number]

## 2010-08-25 NOTE — Discharge Summary (Signed)
[place]  Patient:    Tracey Evans, Tracey Evans                     MRN: [id number] Adm. Date:  [id number] Disc. Date: [id number] Attending:  Pola Corn                           Discharge Summary  ADMISSION DIAGNOSES: 1. Unstable angina. 2. Anxiety. 3. Irritable bowel syndrome.  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 3. Anxiety. 4. Irritable bowel syndrome.  HISTORY OF PRESENT ILLNESS:  This is a 64 year old, African-American woman with a history of myocardial bridging and Prinzmetal angina who presented to the hospital complaining of substernal chest discomfort.  The patient reports she was at work and succumbed to severe bilateral substernal discomfort and presented to the hospital.  She was admitted by Dr. Rinaldo Cloud in my absence and evaluation for her chest discomfort was under way.  Due to her long history of chest discomfort, she has had outpatient evaluations in the past which have not revealed any evidence of ischemia.  However, her chest discomfort is quite typical and prompted this current admission.  LABORATORY DATA AND X-RAY FINDINGS:  Electrocardiogram performed on May 3, revealed normal sinus rhythm with first-degree AV block, nonspecific T wave abnormality.  There has been no significant change in the EKG since July 10, 2000.  The first-degree AV block had not resolved over entire hospital stay on two additional electrocardiograms.  Radiologic studies revealed portable chest x-ray that was normal.  Labs with arterial blood gas pH 7.382, pCO2 47 and pO2 not listed on this particular blood gas.  White blood cell count 5800, hemoglobin 13, hematocrit 38.1.  Serum sodium 140, potassium 4.3, chloride 107, CO2 28.  Calcium, total protein, albumin and AST were unremarkable.  ALT and total bilirubin were also unremarkable.  Cardiac enzymes obtained on the day of admission were  unremarkable.  Serum sodium 142, potassium 4.5, chloride 107, glucose 90.  Lipid profile revealed a total cholesterol of 175, triglycerides 182, HDL cholesterol 45 and LDL cholesterol 94.  HOSPITAL COURSE:  This patient, with a previous known history of myocardial bridging, had undergone cardiac catheterization in the past with Dr. Armanda Magic.  The patient was found at that time to have myocardial bridging, but atypical symptoms of angina.  After admission to the hospital, she was placed on telemetry.  Baseline laboratory studies were obtained and the troponin, CPK, MB and other diagnostic studies with CBC, CMET and lipid panel.  She was subsequently started on Lopressor 50 mg by mouth daily, nitroglycerin paste, Protonix 40 mg, Xanax 0.25 mg and aspirin daily.  The patients chest discomfort improved after she was hospitalized.  She was placed on Norvasc on May 4, at 2.5 mg daily.  As her pain improved, it was that the patient could be released home to have further outpatient diagnostic studies to include a repeat Cardiolite study.  Her chest pain did improve and she was not advised at this time to have repeat heart catheterization as she had undergone heart catheterization approximately two years ago.  The patient does have a high level of anxiety and her discharge medications would include treatment for this particular problem.  The patient improved over the next one to two days and it was felt that she was not at risk for any worsening of her current cardiac condition.  DISCHARGE MEDICATIONS: 1. Lopressor 50 mg p.o. b.i.d. 2. Norvasc 5 mg daily. 3. Xanax 0.25 mg p.o. t.i.d. 4. Protonix 40 mg p.o. daily.  DIET:  2 g sodium restricted diet.  FOLLOWUP:  Follow up with Dr. Shana Chute on Wednesday after discharge. DD:  10/16/00 TD:  10/17/00 Job: [id number]

## 2010-08-25 NOTE — Discharge Summary (Signed)
[place]  Patient:    Tracey Evans, PRISK                   MRN: [id number] Adm. Date:  [id number] Disc. Date: [id number] Attending:  Berry, Jonathan [place] Dictator:   Darcella Gasman. Annie Paras, F.N.P.C. CC:         Runell Gess, M.D.             Ammie Dalton, M.D.                           Discharge Summary  DISCHARGE DIAGNOSES: 1. Chest pain, negative myocardial infarction. 2. Hypertension. 3. Anxiety/stress. 4. Depression. 5. History of gastroesophageal reflux disease.  DISCHARGE CONDITION:  Stable.  DISCHARGE MEDICATIONS: 1. Norvasc 10 mg one daily. 2. Hydrochlorothiazide 25 mg one daily. 3. Atenolol 25 mg daily. 4. Zoloft 50 mg daily. 5. Prevacid 30 mg twice a day. 6. Enteric-coated aspirin 325 mg daily. 7. Imdur 30 mg daily. 8. Xanax 0.5 mg one as needed for anxiety. 9. Inhalers as before.  DISCHARGE INSTRUCTIONS: 1. No work for one week. 2. Low-fat, low-salt diet. 3. Do not eat or drink after midnight Wednesday night for tests on Thursday    morning. 4. You are scheduled for Persantine Cardiolite Thursday, August 03, 1999, at    0915 hours. 5. See Dr. Mayford Knife Aug 11, 1999, at 9:30 a.m. for results.  HISTORY OF PRESENT ILLNESS:  Patient was admitted on August 01, 1999 with chest pain.  She had a history of cardiac catheterization, September 08, 1998, with normal coronaries except for a 50% mid LAD myocardial bridge.  She has a history of hypertension and GERD, has been under a lot of work-related stress recently. She became very upset yesterday, July 31, 1999, and developed chest pressure that evening.  Mid sternal chest pressure with left arm numbness.  No nausea, shortness of breath, or diaphoresis, and no radiation.  She took aspirin with relief of chest pressure.  Her face began twitching and chest pressure returned.  She came to the emergency room.  She was placed on IV nitroglycerin and Lovenox.  Her pain improved.  She also has  a history of asthma.  PAST MEDICAL HISTORY:  As described.  ALLERGIES:  CODEINE.  OUTPATIENT MEDICATIONS: 1. Norvasc 5 daily. 2. Atenolol 25 daily. 3. Imdur 30 daily. 4. Hydrochlorothiazide 25 daily. 5. Enteric-coated aspirin daily. 6. Xanax p.r.n. 7. Zoloft.  FAMILY HISTORY, SOCIAL HISTORY, REVIEW OF SYSTEMS:  Please see history and physical.  PHYSICAL EXAMINATION AT DISCHARGE:  VITAL SIGNS:  Blood pressure 114/70, pulse 80, respirations 20, temperature 98.8, room air oxygen saturation 97%.  GENERAL:  Alert and oriented black female in no acute distress.  SKIN:  Warm and dry.  Brisk capillary refill.  HEART:  S1, S2.  Regular rate and rhythm.  LUNGS:  Clear.  LABORATORY DATA:  Negative for myocardial damage.  CK 196 with 1.5 MBs, and 211 with 1.6 MBs.  Troponin I was less than 0.03.  Electrolytes and hemoglobin were all stable.  HOSPITAL COURSE:  Patients nitroglycerin was discontinued.  She was ambulated and was discharged home that afternoon.  She would be held out of work the rest of the week and get an outpatient Cardiolite. DD:  08/18/99 TD:  08/20/99 Job: [id number]

## 2010-08-25 NOTE — Cardiovascular Report (Signed)
NAMEKELCIE, CURRIE NO.:  [id number]   MEDICAL RECORD NO.:  [id number]                   PATIENT TYPE:  OIB   LOCATION:  2875                                 FACILITY:  [facility]   PHYSICIAN:  Eduardo Osier. Sharyn Lull, M.D.              DATE OF BIRTH:  Jan 09, 1946   DATE OF PROCEDURE:  01/28/2003  DATE OF DISCHARGE:                              CARDIAC CATHETERIZATION   PROCEDURE:  1. Left cardiac catheterization.  2. Selective left and right coronary angiography.  3. Left ventriculography via right groin using Judkins technique.   INDICATIONS:  Ms. Milks is a 65 year old black female with past medical  history significant for myocardial bridging in mid LAD, hypertension, IBS,  arthritis, anxiety disorder.  Complains of retrosternal chest pressure  radiating to the left arm when under stress without associated symptoms.  The patient also complains of exertional dyspnea.  Denies any PND,  orthopnea, leg swelling.  Denies palpitations, lightheadedness, or syncope.  The patient underwent Persantine Cardiolite on January 20, 2003 which showed  small focus of  as well as ischemia in the anterolateral wall with EF of  42%.  Due to recurrent chest pain and mildly positive Persantine Cardiolite  and significant reduction in EF from 73% to 42%, discussed with patient  regarding left catheterization, possible PTCA and stenting, its risks and  benefits and consent for PCI.   PAST MEDICAL HISTORY:  As above.   PAST SURGICAL HISTORY:  1. Cholecystectomy.  2. Hysterectomy.  3. Knee surgery.   MEDICATIONS:  1. Toprol XL 50 mg p.o. q.a.m. and 25 mg p.o. q.p.m.  2. Diovan 300 mg p.o. daily.  3. Baby aspirin 81 mg p.o. daily.  4. Xanax 0.25 mg p.o. b.i.d.  5. Nitrostat sublingual p.r.n.   ALLERGIES:  CODEINE, questionable allergy to IV DYE.   SOCIAL HISTORY:  She is single.  No history of smoking or alcohol abuse.  Works as Environmental health practitioner.  Moved here  from [place] recently.   FAMILY HISTORY:  Father died of CA at age of 68.  Mother died of stroke.  She is hypertensive.  She has four brothers and two sisters.  All of them  are hypertensive.   PHYSICAL EXAMINATION:  GENERAL:  Alert, awake, oriented x3, in no acute  distress.  VITAL SIGNS:  Blood pressure 170/100, pulse 64, regular.  HEENT:  Conjunctiva was ink.  NECK:  Supple.  No JVD.  No bruit.  LUNGS:  Clear to auscultation without rhonchi or rales.  CARDIOVASCULAR:  S1, S2 was normal.  There was soft S4 gallop.  ABDOMEN:  Soft.  Bowel sounds are present, obese, nontender.  EXTREMITIES:  No clubbing, cyanosis, edema.   IMPRESSION:  1. Recurrent chest pain.  2. History of myocardial bridging.  3. Positive Persantine Cardiolite.  4. Uncontrolled hypertension.  5. Morbid obesity.  6. Hypercholesterolemia.  7. Arthritis.  8. Irritable bowel syndrome.   PLAN:  Discussed with patient about Persantine Cardiolite results and  various options of treatment, i.e. maximizing medical prescriptions versus  left catheterization, possible PTCA/stenting, its risks, i.e., death, MI,  stroke, need for emergency CABG, risk of restenosis, local vascular  complications, etc. and consented for PCI.   PROCEDURE:  After obtaining the informed consent, patient was brought to the  catheterization laboratory and was placed on fluoroscopy table.  Right groin  was prepped and draped in usual fashion.  2% Xylocaine was used for local  anesthesia in the right groin.  With the help of thin wall needle a 6-French  arterial sheath was placed.  The sheath was aspirated and flushed.  Next, 6-  French left Judkins catheter was advanced over the wire under fluoroscopic  guidance up to the ascending aorta where it was pulled out.  The catheter  was aspirated and connected to the manifold.  Catheter was further advanced  and engaged into left coronary ostium.  Multiple views of the left system  were taken.   Next, the catheter was disengaged and was pulled out over the  wire and was replaced with 6-French right Judkins catheter which was  advanced over the wire under fluoroscopic guidance up to the ascending aorta  where it was pulled out.  The catheter was aspirated and connected to the  manifold.  Catheter was further advanced and engaged into right coronary  ostium.  Multiple views of the right system were taken.  Next, the catheter  was disengaged and was pulled out over the wire and was replaced with 6-  French pigtail catheter which was advanced over the wire under fluoroscopic  guidance up to the ascending aorta where it was pulled out.  The catheter  was aspirated and connected to the manifold.  Catheter was further advanced  across the aortic valve into the LV.  LV pressures were recorded.  Next, LV  graphy was done in 30 degree RAO position.  Post angiographic pressures were  recorded from LV and then pullback pressures recorded from aorta.  There was  no gradient across the aortic valve.  Next, the pigtail catheter was pulled  down up to the abdominal aorta.  Aortography with bilateral renal  angiography was done in ____________ position.  Next, the pigtail catheter  was pulled out.  Sheaths were aspirated and flushed.  Arteriotomy was closed  at the end of the procedure with Perclose without complications.   FINDINGS:  LV showed good LV systolic function, LVH.  EF of 55-60%.  Left  main was patent.  LAD has 10-15% mid and distal stenosis.  Diagonal 1 and  diagonal 2 were very small which were patent.  Ramus was large which was  patent.  Left circumflex was patent.  OM 1 was medium sized which was  patent.  OM 2 was small which was patent.  RCA was patent.  Aortography showed no evidence of abdominal aortic  aneurysm.  Bilateral renal arteries appear to be patent.  The patient tolerated procedure well.  There were no complications.  The patient was  transferred to recovery room in  stable condition.                                               Eduardo Osier. Sharyn Lull, M.D.    MNH/MEDQ  D:  01/28/2003  T:  01/28/2003  Job:  [id number]   cc:   Cath Lab

## 2010-08-25 NOTE — Discharge Summary (Signed)
NAMECAMILIA, CAYWOOD NO.:  [id number]   MEDICAL RECORD NO.:  [id number]                   PATIENT TYPE:  IPS   LOCATION:  0503                                 FACILITY:  [facility]   PHYSICIAN:  Carolanne Grumbling, M.D.                 DATE OF BIRTH:  Jul 06, 1945   DATE OF ADMISSION:  03/30/2002  DATE OF DISCHARGE:  04/01/2002                                 DISCHARGE SUMMARY   INITIAL ASSESSMENT AND DIAGNOSIS:  Ms. Tison was a 65 year old woman who  was admitted after taking an overdose of 16 Xanax in divided those doses on  the same day.  She had an argument with her boyfriend who reportedly was  cheating on her.  She said her plan was to get some sleep and also to get  even with the boyfriend apparently.  She had no history of suicide attempts  or no other psychiatric history.   MENTAL STATUS AT THE TIME OF THE INITIAL EVALUATION:  Revealed a woman who  was angry at being in the hospital and did not want to be admitted to this  particular hospital.  She was appropriately dressed and groomed.  Speech was  clear.  Mood was angry, depressed.  Thought processes were logical and  coherent.  There was no evidence of any psychosis.  Short and long term  memory were intact.  Judgment currently seemed impaired by her anger.  Insight was minimal.  Intellectual functioning seemed at least average.   PHYSICAL EXAMINATION:  Within normal limits or noncontributory.   ADMITTING DIAGNOSIS:   AXIS I:  Major depression, single episode.   AXIS II:  Deferred.   AXIS III:  1. Angina  2. Diverticulitis.  3. Asthma.   AXIS IV:  Moderate.   AXIS V:  35/75.   FINDINGS:  All indicated laboratory examinations were within normal limits.   HOSPITAL COURSE:  While in the hospital, Ms. Imhoff consistently denied  any suicidal ideation.  She calmed down after the first few hours of being  in the hospital and was appropriate from that point on.  She had a family  session with her fiance whom she was planning to marry in the Spring.  They  both agreed that they had communication problems and they were willing to  work in counseling to improve them because they both seemed to believe that  the relationship was worth salvaging.  Consequently she was discharged home  because of her consistent denial of suicidal thoughts and her optimism of  the future.  Her anger had disappeared and she wanted to go home.   MEDICATIONS AT THE TIME OF DISCHARGE:  At the time of discharge she was  taking Toprol XL 50 mg twice daily, Norvasc 10 mg daily, and Zoloft 50 mg  daily.   POST HOSPITAL CARE PLANS:  Followup plans will be made by her.  She plans to  move to [place], [place] within a matter of weeks.   DIET AND ACTIVITY:  There were no restrictions placed on her diet or her  activity.   FINAL DIAGNOSES:   AXIS I:  Adjustment disorder with mixed emotional disturbance.   AXIS II:  Deferred.   AXIS III:  Angina, diverticulitis, asthma.   AXIS IV:  Moderate.   AXIS V:  50/75.                                                Carolanne Grumbling, M.D.    GT/MEDQ  D:  04/15/2002  T:  04/15/2002  Job:  [id number]

## 2010-08-25 NOTE — Discharge Summary (Signed)
Tracey Evans. [facility]  Patient:    Tracey Evans, Tracey Evans Visit Number: [id number] MRN: [id number]          Service Type: OBV Location: 3700 [id number] Attending Physician:  Ricki Rodriguez Dictated by:   Ricki Rodriguez, M.D. Admit Date:  03/23/2001 Discharge Date: 03/24/2001                             Discharge Summary  REFERRING PHYSICIAN:  Osvaldo Shipper. Spruill, M.D.  PRINCIPAL DIAGNOSES: 1. Chest wall pain. 2. Hypertension. 3. Anxiety.  DISCHARGE MEDICATIONS: 1. Norvasc 10 mg q.d. 2. Toprol XL 25 mg q.d. 3. Aspirin 325 mg q.d. 4. Xanax 0.25 mg tid 5. Prevacid 30 mg q.d. 6. Imdur 30 mg q.d.  DIET:  Low fat, low salt.  ACTIVITY:  As tolerated.  CONDITION ON DISCHARGE:  Stable, myocardial infarction ruled out.  FOLLOWUP:  Dr. Kevin Fenton Spruill in one day to arrange for outpatient stress test.  HISTORY OF PRESENT ILLNESS:  This 65 year old black female had substernal chest pain, pressure type, non-radiating, without sweating spell or shortness of breath, and not aggravated by deep breathing, but with no fever, no sputum production.  Chest pain was lasting for 5 minutes at a time.  Had a long-standing history of hypertension, and history of acid reflux disease. Cardiac catheterization in May 2000, was without any significant coronary artery disease.  PHYSICAL EXAMINATION:  VITAL SIGNS:  Temperature 97.4, pulse 76, respiratory rate 20, blood pressure 146/96, height 5 feet 5 inches, weight 196 pounds.  GENERAL:  The patient is alert and oriented x3.  HEENT:  Head is normocephalic, atraumatic with dark brown hair.  Eyes are brown eyes.  Pupils are equal, round and reactive to light.  Extraocular movements intact.  The patient wears glasses.  Ears, nose, and throat showed mucus membranes moist and pink.  NECK:  No JVD, no carotid bruits.  LUNGS:  Clear bilaterally.  HEART:  Normal S1 and S2, without murmurs, rubs, or gallops.  ABDOMEN:  Soft and  nontender.  EXTREMITIES:  No clubbing, cyanosis, or edema.  Peripheral pulses were 2+.  NEUROLOGIC:  Bilateral equal grips.  LABORATORY DATA:  Normal hemoglobin, hematocrit, white blood cell count, and platelet count.  Normal electrolytes, BUN, and creatinine.  Normal CK-MBs and troponin-Is.  EKG showed normal sinus rhythm.  X-ray showed no acute process.  HOSPITAL COURSE:  The patient was admitted to rule out myocardial infarction protocol unit.  She was offered a stress test after she had negative cardiac enzymes x2.  However, the patient declined stress test at the present time. She claimed she had some important court work and had to leave the hospital, and she will be in touch with her primary care physician, Dr. Donia Guiles the following day to arrange for outpatient stress test.  Due to low likelihood of coronary artery disease, she was discharged home on aspirin, Toprol XL, Norvasc, Prevacid, Imdur, and Xanax in stable condition. Dictated by:   Ricki Rodriguez, M.D. Attending Physician:  Ricki Rodriguez DD:  04/30/01 TD:  05/01/01 Job: [id number]

## 2010-08-25 NOTE — H&P (Signed)
[facility]  Patient:    Tracey Evans, Tracey Evans                     MRN: [id number] Adm. Date:  05/07/00 Attending:  Marnee Spring. Wiliam Ke, M.D. CC:         Osvaldo Shipper. Spruill, M.D.   History and Physical  CHIEF COMPLAINT:  Abdominal pain.  HISTORY OF PRESENT ILLNESS:  This is a 65 year old female having a gallbladder attack with known gallstones.  She has nausea, vomiting, right upper quadrant and right scapular tip pain.  PAST MEDICAL HISTORY:  She has a history of "myocardial bridging."  She has been seen by Dr. Osvaldo Shipper. Spruill, who has okayed her for surgery.  PAST SURGICAL HISTORY:  Not contributory.  SOCIAL HISTORY:  Cigarettes:  None.  Alcohol:  None.  MEDICATIONS 1. Toprol XL 50 mg daily. 2. Xanax. 3. Prevacid. 4. Nitroglycerin. 5. Imdur 300 mg daily.  ALLERGY:  CODEINE.  FAMILY HISTORY AND REVIEW OF SYSTEMS:  As above.  PHYSICAL EXAMINATION  VITAL SIGNS:  Temperature 97.6, pulse 80, respirations 16, blood pressure 155/110.  GENERAL APPEARANCE:  Well-developed, well-nourished, in moderate abdominal distress.  HEENT:  Cranium normocephalic.  Eyes, ears, nose and throat normal.  NECK:  Supple.  CHEST:  Clear.  HEART:  Heart in regular rhythm, without thrills, murmurs or gallops.  ABDOMEN:  Soft and flat.  Tender in right upper quadrant and epigastrium.  PELVIC:  Deferred.  RECTAL:  Deferred.  EXTREMITIES:  Within normal limits.  NEUROLOGIC:  Within normal limits.  SKIN:  Within normal limits.  ADMITTING IMPRESSION:  Symptomatic gallstones. DD:  05/07/00 TD:  05/07/00 Job: [id number]

## 2010-09-05 ENCOUNTER — Telehealth: Payer: Self-pay | Admitting: Family Medicine

## 2010-09-07 ENCOUNTER — Ambulatory Visit (INDEPENDENT_AMBULATORY_CARE_PROVIDER_SITE_OTHER): Payer: Medicare Other | Admitting: Family Medicine

## 2010-09-07 ENCOUNTER — Encounter: Payer: Self-pay | Admitting: Family Medicine

## 2010-09-07 VITALS — BP 116/80 | Temp 98.5°F | Wt 199.4 lb

## 2010-09-07 DIAGNOSIS — K5792 Diverticulitis of intestine, part unspecified, without perforation or abscess without bleeding: Secondary | ICD-10-CM

## 2010-09-07 DIAGNOSIS — K5732 Diverticulitis of large intestine without perforation or abscess without bleeding: Secondary | ICD-10-CM

## 2010-09-07 MED ORDER — CIPROFLOXACIN HCL 500 MG PO TABS: 500.0000 mg | ORAL_TABLET | Freq: Two times a day (BID) | ORAL | Status: AC

## 2010-09-07 MED ORDER — METRONIDAZOLE 500 MG PO TABS: 500.0000 mg | ORAL_TABLET | Freq: Three times a day (TID) | ORAL | Status: AC

## 2010-09-07 NOTE — Patient Instructions (Signed)
This appears to be diverticulitis Take the Cipro and Flagyl as directed Clear liquid diet (broth, jello, popsicles, fluids, applesauce) until the abdominal pain is better Then you can advance to starchy things and once that goes ok, add back the other stuff If your symptoms change or worsen- call or go to the ER LOTS of fluids!!! Call with any questions or concerns Hang in there!!

## 2010-09-07 NOTE — Progress Notes (Signed)
  Subjective:    Patient ID: Tracey Evans, female    DOB: June 29, 1945, 65 y.o.   MRN: [id number]  HPI abd pain- was constipated for 3 days and then had 'horrible cramps for last 3 days'.  Now bloated, 'sore all over'.  Has hx of diverticular dz.  Yesterday stopped having diarrhea.  Ate 4 crackers and water this AM and pt felt herself bloat.  Denies nausea.  Having sweats but has not checked temp.  Recently ate pecan pie and trail mix.  Fibro flare- hurting from 'neck all the way down to my waist'.  'i have a bone spur that sticks into a nerve and it is really hurting'.  Alternating tramadol w/ flexeril and vicodin.  vicodin constipated pt.   Review of Systems For ROS see HPI     Objective:   Physical Exam  Constitutional: She appears well-developed.       Obviously uncomfortable  Cardiovascular: Normal rate, regular rhythm and intact distal pulses.   Pulmonary/Chest: Effort normal and breath sounds normal. No respiratory distress. She has no wheezes.  Abdominal: Soft. Bowel sounds are normal. She exhibits no distension. There is tenderness (TTP over LLQ, pain out of proportion to exam). There is guarding (voluntary). There is no rebound.          Assessment & Plan:

## 2010-09-10 DIAGNOSIS — K5792 Diverticulitis of intestine, part unspecified, without perforation or abscess without bleeding: Secondary | ICD-10-CM | POA: Insufficient documentation

## 2010-09-10 NOTE — Assessment & Plan Note (Signed)
Pt's sxs and PE consistent w/ infxn.  As pt's mother died from perforated bowel 2/2 diverticulitis will tx w/ abx.  Discussed clear liquid diet and progression as able.  Reviewed supportive care and red flags that should prompt return.  Pt expressed understanding and is in agreement w/ plan.

## 2010-09-18 ENCOUNTER — Encounter: Payer: Self-pay | Admitting: Family Medicine

## 2010-09-18 ENCOUNTER — Ambulatory Visit (INDEPENDENT_AMBULATORY_CARE_PROVIDER_SITE_OTHER): Payer: Medicare Other | Admitting: Family Medicine

## 2010-09-18 VITALS — BP 110/70 | Temp 97.7°F | Wt 202.4 lb

## 2010-09-18 DIAGNOSIS — K5732 Diverticulitis of large intestine without perforation or abscess without bleeding: Secondary | ICD-10-CM

## 2010-09-18 DIAGNOSIS — K5792 Diverticulitis of intestine, part unspecified, without perforation or abscess without bleeding: Secondary | ICD-10-CM

## 2010-09-18 DIAGNOSIS — M2550 Pain in unspecified joint: Secondary | ICD-10-CM | POA: Insufficient documentation

## 2010-09-18 MED ORDER — NAPROXEN 500 MG PO TABS: 500.0000 mg | ORAL_TABLET | Freq: Two times a day (BID) | ORAL | Status: AC

## 2010-09-18 NOTE — Assessment & Plan Note (Signed)
Improved.  Now tolerating diet.  Stop abx.

## 2010-09-18 NOTE — Assessment & Plan Note (Signed)
Pt having serum sickness like reaction from cipro/flagyl combo.  Stop abx.  Start scheduled NSAIDs.  Reviewed supportive care and red flags that should prompt return.  Pt expressed understanding and is in agreement w/ plan.

## 2010-09-18 NOTE — Progress Notes (Signed)
  Subjective:    Patient ID: Tracey Evans, female    DOB: 12-22-1945, 65 y.o.   MRN: [id number]  HPI Diffuse bone pain- developed 3 days ago.  Pain from L-spine down.  Unable to bear weight w/out pain.  Unable to touch R knee.  Did develop associated urticarial rash on lower legs.  Started cipro and flagyl on 5/31- pain developed ~1 week after starting meds for diverticulitis.  abd pain is better.   Review of Systems For ROS see HPI     Objective:   Physical Exam  Constitutional: She appears well-developed and well-nourished. She appears distressed (obviously in pain, limping).  Abdominal: Soft. Bowel sounds are normal. She exhibits no distension. There is no tenderness. There is no rebound and no guarding.  Musculoskeletal: She exhibits tenderness (diffuse bony tenderness of lower extremities). She exhibits no edema.  Skin: Skin is warm and dry. Rash (faint urticarial rash on bilaterally lower extremities) noted.          Assessment & Plan:

## 2010-09-18 NOTE — Patient Instructions (Signed)
Take the Naprosyn twice daily for the next 5 days and then as needed for pain.  Take w/ food to avoid upset stomach STOP the cipro and flagyl! Drink plenty of fluids Take the Percocet as needed Call with any questions or concerns Hang in there!

## 2010-09-24 ENCOUNTER — Other Ambulatory Visit: Payer: Self-pay | Admitting: Family Medicine

## 2010-09-25 NOTE — Telephone Encounter (Signed)
Refill sent.

## 2010-10-03 NOTE — Telephone Encounter (Signed)
ERROR

## 2010-10-19 ENCOUNTER — Telehealth: Payer: Self-pay | Admitting: Family Medicine

## 2010-10-19 DIAGNOSIS — M2550 Pain in unspecified joint: Secondary | ICD-10-CM

## 2010-10-19 DIAGNOSIS — M797 Fibromyalgia: Secondary | ICD-10-CM

## 2010-10-19 NOTE — Telephone Encounter (Signed)
Pt notes that the MD made the recommendation directly to her and she said that she would contact Dr. Beverely Low for referral. Pt notes that she is now ready (had previously denied). Pt notes that she does not want to use Dr. Vear Clock, she did not like him previously.

## 2010-10-19 NOTE — Telephone Encounter (Signed)
Please advise 

## 2010-10-19 NOTE — Telephone Encounter (Signed)
Ok to refer to pain management- please tell her that typically if a specialist makes a recommendation that they make the referral.

## 2010-12-04 ENCOUNTER — Other Ambulatory Visit: Payer: Self-pay | Admitting: Physical Medicine and Rehabilitation

## 2010-12-04 ENCOUNTER — Encounter
Payer: Medicare Other | Attending: Physical Medicine and Rehabilitation | Admitting: Physical Medicine and Rehabilitation

## 2010-12-04 ENCOUNTER — Ambulatory Visit (HOSPITAL_COMMUNITY)
Admission: RE | Admit: 2010-12-04 | Discharge: 2010-12-04 | Disposition: A | Discharge status: Home / Self Care | Payer: Medicare Other | Source: Ambulatory Visit | Attending: Physical Medicine and Rehabilitation | Admitting: Physical Medicine and Rehabilitation

## 2010-12-04 DIAGNOSIS — M546 Pain in thoracic spine: Secondary | ICD-10-CM | POA: Insufficient documentation

## 2010-12-04 DIAGNOSIS — IMO0001 Reserved for inherently not codable concepts without codable children: Secondary | ICD-10-CM | POA: Insufficient documentation

## 2010-12-04 DIAGNOSIS — Q245 Malformation of coronary vessels: Secondary | ICD-10-CM | POA: Insufficient documentation

## 2010-12-04 DIAGNOSIS — E039 Hypothyroidism, unspecified: Secondary | ICD-10-CM | POA: Insufficient documentation

## 2010-12-04 DIAGNOSIS — M169 Osteoarthritis of hip, unspecified: Secondary | ICD-10-CM | POA: Insufficient documentation

## 2010-12-04 DIAGNOSIS — M545 Low back pain, unspecified: Secondary | ICD-10-CM | POA: Insufficient documentation

## 2010-12-04 DIAGNOSIS — M25559 Pain in unspecified hip: Secondary | ICD-10-CM | POA: Insufficient documentation

## 2010-12-04 DIAGNOSIS — I10 Essential (primary) hypertension: Secondary | ICD-10-CM | POA: Insufficient documentation

## 2010-12-04 DIAGNOSIS — M25519 Pain in unspecified shoulder: Secondary | ICD-10-CM | POA: Insufficient documentation

## 2010-12-04 DIAGNOSIS — J45909 Unspecified asthma, uncomplicated: Secondary | ICD-10-CM | POA: Insufficient documentation

## 2010-12-04 DIAGNOSIS — Z8711 Personal history of peptic ulcer disease: Secondary | ICD-10-CM | POA: Insufficient documentation

## 2010-12-04 DIAGNOSIS — Z79899 Other long term (current) drug therapy: Secondary | ICD-10-CM | POA: Insufficient documentation

## 2010-12-04 DIAGNOSIS — G5600 Carpal tunnel syndrome, unspecified upper limb: Secondary | ICD-10-CM | POA: Insufficient documentation

## 2010-12-04 DIAGNOSIS — M1610 Unilateral primary osteoarthritis, unspecified hip: Secondary | ICD-10-CM | POA: Insufficient documentation

## 2010-12-04 DIAGNOSIS — M76899 Other specified enthesopathies of unspecified lower limb, excluding foot: Secondary | ICD-10-CM | POA: Insufficient documentation

## 2010-12-04 DIAGNOSIS — G894 Chronic pain syndrome: Secondary | ICD-10-CM

## 2010-12-04 DIAGNOSIS — Z7982 Long term (current) use of aspirin: Secondary | ICD-10-CM | POA: Insufficient documentation

## 2010-12-04 DIAGNOSIS — G8929 Other chronic pain: Secondary | ICD-10-CM | POA: Insufficient documentation

## 2010-12-04 DIAGNOSIS — M25579 Pain in unspecified ankle and joints of unspecified foot: Secondary | ICD-10-CM | POA: Insufficient documentation

## 2010-12-04 DIAGNOSIS — E785 Hyperlipidemia, unspecified: Secondary | ICD-10-CM | POA: Insufficient documentation

## 2010-12-04 DIAGNOSIS — K219 Gastro-esophageal reflux disease without esophagitis: Secondary | ICD-10-CM | POA: Insufficient documentation

## 2010-12-04 NOTE — Progress Notes (Signed)
[name] is a 65 year old African American woman who is kindly referred by Tracey Evans.  Tracey Evans [facility] to [facility] with multiple pain complaints.  She has a history of bilateral shoulder pain, underwent right shoulder arthroscopy by Dr. Rennis Evans about 2 weeks ago.  She had the left shoulder done by him about 2 years ago.  She has 3-4 year history of chronic left- sided low lumbosacral to low back pain radiating to the buttock and down the leg to the knee approximately.  Pain is worse when she is up standing and walking.  She also has some trouble flexing.  She has been told that she has a bone spur at T11.  Her flare ups are worse with rain and activities such as vacuuming.  She also has a history of bilateral knee pain, has had bilateral knee surgery back in the 1990s by Dr. Montez Evans.  She also has a history of a right ankle trimalleolar fracture approx fall of 2011 which was managed by Dr. Shon Evans.  She reports her pain averaging about a 7-8 on a scale of 10.  She states about 3-4 years ago it was managed by Dr. Thyra Evans.  She participated in physical Evans at that time at [facility]. She reports that she also had some ultrasound at that time which seem to work for about 3 hours, but then her low back pain came back. At one point she was using narcotics regularly. She states she was discharged from the pain clinic for financial issues and she missed an appointment.  Her general activity is moderately effected.  Sleep tends to be poor. Pain is worse in the daytime, evening, night, morning as best.  Pain is worse after activity such as walking, bending, although sitting and activity also aggravate.  She reports pain improves with rest and medication, stretching exercises.  She reports good relief with meds she is using currently.  Her medication list includes the following Tracey Evans, Tracey Evans, Tracey Evans, Tracey Evans,  Tracey Evans, Tracey Evans 50 mg q.6 h, Tracey Evans, Tracey Evans, Tracey Evans, Tracey Evans, Tracey Evans 5/325 per Dr. Rennis Evans currently, Tracey Evans, Tracey Evans, and Tracey Evans.  Functional status, she can walk 5-10 minutes before her right low back, posterior hip lower extremity bothers her.  She is able to climb stairs and drive.  She lives independently, is able to manage her ADLs as well as household tasks including housekeeping and grocery shopping.  Review of systems, negative for problems controlling bowel or bladder. Admits to depression, anxiety.  Denies suicidal ideation.  Also, positive night sweats, diarrhea, constipation, abdominal pain, wheezing.  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  SURGICAL HISTORY:  Cholecystectomy, hysterectomy, tonsillectomy, heart catheterization x2 1998 and 2001, arthroscopic surgery, bilateral knees 1990s by Dr. Montez Evans, shoulder arthroscopic surgery in 2012 and 2010 by Dr. Rennis Evans, bone spurs removed.  History of trimalleolar fracture, right ankle.  History of carpal tunnel, both hands.  FAMILY HISTORY:  Father died in 80s, complications alcoholism, dementia, possible colon cancer.  Mother died at age 34, senile dementia, complications of diverticular disease.  Two brothers, 4 sisters, history of drug addiction in one sister and one brother.  SOCIAL HISTORY:  The patient lives independently.  She has her daughter living with her currently for short time after her shouldet surgery. Denies illegal substance use.  Denies alcohol or smoking.  PHYSICAL EXAMINATION:  Exam today, blood pressure is 137/91, pulse 67,  respirations 16, 99% saturation on room air.  She is a mildly obese African American thin female who does not appear in any distress.  She is oriented x3.  Speech is clear.  Affect is bright.  She is  alert, cooperative and pleasant.  Follows commands without difficulty.  Answers my questions appropriately.  Cranial nerves and coordination are intact. Reflexes are 2+ at biceps, triceps, brachioradialis, 2+ patellar and Achilles tendon.  No abnormal tone, clonus or tremors.  Toes are downgoing on the right, equivocal on the left.  Hoffman sign is not present.  Sensation is intact to light touch, pinprick, proprioception as well as vibratory sense in the lower extremities.  Motor strength is good in both upper and lower extremities.  Full effort was not given with right shoulder abduction due to recent surgery.  She has relatively well-preserved range of motion in both shoulders abducting to 130 degrees, external rotation is 80 in both shoulders, 40 degrees of internal rotation is noted on the left and 30 degrees on the right without any pain and discomfort.  Transitions from sitting to standing easily.  Gait in the room is nonantalgic.  Tandem gait is performed slowly and carefully.  Romberg test is performed adequately.  She has no complaints of pain with rotation of her neck extension or lateral flexion.  She reports some discomfort in the low back with forward flexion and range and also with extension, especially extension combined with rotation on the right.  She has significant pain with internal and external rotation of the right hip.  No pain to the left hip.  She has limited motion at the right hip.  She has tenderness over both trochanters bilaterally.  She has multiple areas of point tenderness throughout her trunk, extremity and both upper and lower extremities.  IMPRESSION: 1. History of fibromyalgia. 2. Thoracolumbar pain, etiology not clear. 3. Significant right hip pain with internal external rotation, last     hip x-ray was done approximately 5 years ago in 2008, at that time,     showing rather significant joint changes. 4. History of right ankle fracture  with chronic right ankle pain,     history of bilateral knee pain with a history of previous     arthroscopic surgery. 5. Bilateral trochanteric bursitis.  PLAN:  I have discussed the various treatment options with her, would like to pursue further workup of her right hip as this seems to be a significant problem.  Last x-rays were done about 5 years ago.  Repeat these x-rays.  May consider moving toward MRI of the right hip as well. She would like to see Dr. Shon Evans if treatment options include surgery. She is quite limited with ambulation 5-10 minutes at this point.  We will obtain urine drug screen prior to any prescription, would like to request notes from Dr. Berton Lan office she has seen him for the pain management for several years.  I have answered all her questions.  She seems to be comfortable with this current plan.  I will see her back in a month.     Brantley Stage, M.D. Electronically Signed    DMK/MedQ D:  12/04/2010 10:27:28  T:  12/04/2010 12:23:02  Job #:  [id number]

## 2010-12-22 ENCOUNTER — Encounter
Payer: Medicare Other | Attending: Physical Medicine and Rehabilitation | Admitting: Physical Medicine and Rehabilitation

## 2010-12-22 DIAGNOSIS — M169 Osteoarthritis of hip, unspecified: Secondary | ICD-10-CM | POA: Insufficient documentation

## 2010-12-22 DIAGNOSIS — M1610 Unilateral primary osteoarthritis, unspecified hip: Secondary | ICD-10-CM

## 2010-12-22 DIAGNOSIS — M25559 Pain in unspecified hip: Secondary | ICD-10-CM | POA: Insufficient documentation

## 2010-12-22 DIAGNOSIS — IMO0001 Reserved for inherently not codable concepts without codable children: Secondary | ICD-10-CM

## 2010-12-22 DIAGNOSIS — M25579 Pain in unspecified ankle and joints of unspecified foot: Secondary | ICD-10-CM | POA: Insufficient documentation

## 2010-12-22 DIAGNOSIS — M76899 Other specified enthesopathies of unspecified lower limb, excluding foot: Secondary | ICD-10-CM | POA: Insufficient documentation

## 2010-12-22 DIAGNOSIS — E669 Obesity, unspecified: Secondary | ICD-10-CM | POA: Insufficient documentation

## 2010-12-22 DIAGNOSIS — M549 Dorsalgia, unspecified: Secondary | ICD-10-CM | POA: Insufficient documentation

## 2010-12-23 NOTE — Assessment & Plan Note (Signed)
[name] is a pleasant 65 year old African American woman who was initially seen by me on December 04, 2010.  She is a patient of Dr. Joesph Evans.  She comes to our center for multiple pain complaints as she had recently undergone a shoulder arthroscopy by Dr. Rennis Evans and she had been complaining of back pain, right hip pain, right ankle pain and she was diagnosed with trochanteric bursitis as well as the last visit, her average pain is about 8 on a scale of 10.  Today she comes in and tells me she has had a 2-week history of pain of "whole body pain.  She states it is basically from her shoulders down to her feet.  She woke up with it.  She tells me it is a fibromyalgia flare up.  She has been using naproxen and Robaxin intermittently.  Her pain is described as constant, worse with movement.  Improves with rest and medications.  She reports fair relief with current meds.  Functional status.  She can walk 15-20 minutes at a time.  She is able to climb stairs.  She drives.  She is independent with self-care.  There is no changes in her review of systems.  Denies bowel or bladder control problems.  Denies suicidal ideation.  Admits to some depression and anxiety.  Past medical, social, family history unchanged from previous visit.  Medications prescribed through [facility] at this time.  Her current med list is attached to the chart.  On exam; her blood pressure is 142/76, pulse 63, respirations 18, 98% saturated on room air.  She is an obese [place] American woman who does not appear in any distress.  She is oriented x3.  Speech is clear. Affect is bright.  She is alert, cooperative and pleasant.  Follows commands without difficulty.  Answers my questions appropriately. Cranial nerves and coordination are intact.  Her reflexes are 2+ at biceps, triceps, brachioradialis; 2+ at patellar and Achilles tendon. No abnormal tone, clonus or tremors are noted.  She has  good strength in her left upper and bilateral lower extremities.  Her right shoulder is still recovering from previous surgery and I did not muscle tested.  She reports intact sensation to light touch in both upper and lower extremities.  No focal sensory deficits are appreciated.  She has functional range of motion in her neck, fairly well maintained.  She has limitations of motion in the right shoulder, relatively maintained motion in the left shoulder, mild limitations in lumbar motion.  Transitions easily from sitting to standing.  Tandem gait and Romberg test are minimally limited.  Her right hip which she complained of some pain with internal and external rotation at the last visit is not particularly bothersome at this time.  She has tenderness over the trochanters bilaterally, however, she does have.  She does have significant tenderness with palpation throughout her upper extremities, lower extremities and paraspinal muscles.  IMPRESSION: 1. Myalgias. 2. History of fibromyalgia. 3. Radiographs of her right hip show mild right hip osteoarthritis. 4. Bilateral trochanteric bursitis.  PLAN:  She appears to be having myalgias, this could possibly be related to her fibromyalgia.  She has had been diagnosed with this for several years. Considering other causes for myalgias:  I am also wondering whether or not her statin may be contributing to her myalgias.  She also has a history of hypothyroidism.  I am not sure the last time her TSH was checked as she tells me  she has not seen her primary care for almost a year.  We will obtain some blood work today.  She is not interested in having any further workup or treatment for her right hip pain.  She understands that there is some mild osteoarthritis here.  She is not interested in any injections or changes in her medication for this.  She states she gets along okay walking with it.  We will see her back after the blood work is  completed.  I have answered all her questions.  She is comfortable with this plan.     Tracey Evans, M.D. Electronically Signed    DMK/MedQ D:  12/22/2010 14:22:47  T:  12/22/2010 23:58:44  Job #:  [id number]

## 2010-12-26 ENCOUNTER — Other Ambulatory Visit: Payer: Self-pay | Admitting: Family Medicine

## 2010-12-27 LAB — BASIC METABOLIC PANEL
BUN: 4 — ABNORMAL LOW
CO2: 29
Calcium: 9.1
Creatinine, Ser: 0.7
GFR calc non Af Amer: >60
Glucose, Bld: 110 — ABNORMAL HIGH
Sodium: 142

## 2010-12-27 LAB — DIFFERENTIAL
Basophils Absolute: 0
Eosinophils Relative: 1
Lymphocytes Relative: 45
Monocytes Absolute: 0.4
Monocytes Relative: 5

## 2010-12-27 LAB — CBC
HCT: 39.3
Hemoglobin: 13.1
MCHC: 33.4
RBC: 4.29
RDW: 13.1

## 2010-12-27 LAB — POCT CARDIAC MARKERS: Troponin i, poc: <0.05

## 2011-01-01 ENCOUNTER — Ambulatory Visit: Payer: Medicare Other | Admitting: Physical Medicine and Rehabilitation

## 2011-01-02 LAB — RAPID STREP SCREEN (MED CTR MEBANE ONLY): Streptococcus, Group A Screen (Direct): NEGATIVE

## 2011-01-10 LAB — DIFFERENTIAL
Lymphocytes Relative: 44
Lymphs Abs: 4.5 — ABNORMAL HIGH
Monocytes Absolute: 0.7
Monocytes Relative: 7
Neutro Abs: 4.6
Neutrophils Relative %: 46

## 2011-01-10 LAB — CBC
Hemoglobin: 13.5
MCHC: 32.5
RBC: 4.38
WBC: 10.1

## 2011-01-10 LAB — APTT: aPTT: 34

## 2011-01-10 LAB — PROTIME-INR: INR: 1

## 2011-01-10 LAB — POCT I-STAT, CHEM 8
BUN: 4 — ABNORMAL LOW
Chloride: 105
HCT: 42
Potassium: 3.6
Sodium: 141

## 2011-01-19 ENCOUNTER — Ambulatory Visit: Payer: Medicare Other | Admitting: Physical Medicine and Rehabilitation

## 2011-01-22 ENCOUNTER — Encounter
Payer: Medicare Other | Attending: Physical Medicine and Rehabilitation | Admitting: Physical Medicine and Rehabilitation

## 2011-01-22 DIAGNOSIS — M545 Low back pain, unspecified: Secondary | ICD-10-CM | POA: Insufficient documentation

## 2011-01-22 DIAGNOSIS — M169 Osteoarthritis of hip, unspecified: Secondary | ICD-10-CM | POA: Insufficient documentation

## 2011-01-22 DIAGNOSIS — M76899 Other specified enthesopathies of unspecified lower limb, excluding foot: Secondary | ICD-10-CM | POA: Insufficient documentation

## 2011-01-22 DIAGNOSIS — R279 Unspecified lack of coordination: Secondary | ICD-10-CM

## 2011-01-22 DIAGNOSIS — M47814 Spondylosis without myelopathy or radiculopathy, thoracic region: Secondary | ICD-10-CM | POA: Insufficient documentation

## 2011-01-22 DIAGNOSIS — M47812 Spondylosis without myelopathy or radiculopathy, cervical region: Secondary | ICD-10-CM | POA: Insufficient documentation

## 2011-01-22 DIAGNOSIS — IMO0001 Reserved for inherently not codable concepts without codable children: Secondary | ICD-10-CM | POA: Insufficient documentation

## 2011-01-22 DIAGNOSIS — M542 Cervicalgia: Secondary | ICD-10-CM

## 2011-01-22 DIAGNOSIS — M1610 Unilateral primary osteoarthritis, unspecified hip: Secondary | ICD-10-CM | POA: Insufficient documentation

## 2011-01-22 DIAGNOSIS — M25519 Pain in unspecified shoulder: Secondary | ICD-10-CM | POA: Insufficient documentation

## 2011-01-22 LAB — CBC
HCT: 42.4
MCV: 91.7
RBC: 4.63
WBC: 6.6

## 2011-01-22 LAB — DIFFERENTIAL
Eosinophils Absolute: 0
Eosinophils Relative: 1
Lymphocytes Relative: 29
Lymphs Abs: 1.9
Monocytes Relative: 8

## 2011-01-22 LAB — POCT I-STAT CREATININE
Creatinine, Ser: 0.9
Operator id: 208841

## 2011-01-22 LAB — I-STAT 8, (EC8 V) (CONVERTED LAB)
BUN: 7
Bicarbonate: 30.7 — ABNORMAL HIGH
Chloride: 100
Glucose, Bld: 123 — ABNORMAL HIGH
HCT: 46
Hemoglobin: 15.6 — ABNORMAL HIGH
Operator id: 208841
Sodium: 137
pCO2, Ven: 47.2

## 2011-01-23 NOTE — Assessment & Plan Note (Signed)
Tracey Evans is a pleasant 65 year old African American woman who is initially seen here at the [facility] on December 04, 2010.  Her problem list at that time included fibromyalgia, thoracolumbar pain, right hip pain, history of right ankle fracture and status post shoulder surgery per Dr. Rennis Chris and bilateral trochanteric bursitis.  She was seen again on December 22, 2010.  At that time, her biggest problem was overall body pain and some blood work was drawn at that visit including CK, thyroid function, and B12.  She is back in today.  She is doing quite a bit better.  Her average pain is about a 6 on a scale of 10.  She has recently got back from the [employer].  She drove there and spent about 4 days.  She states that she had a different pillow and was drove about 5 hours.  Her shoulders are little more sore today.  Her pain complaints include shoulders and low back.  Other than that, her body myalgias have dispersed and overall she is doing quite well. She does admit that she continues to battle some depression, but denies suicidal ideation.  She maintains contact with Dr. Sharyn Lull who prescribes her alprazolam for anxiety.  She reports good relief with current medications.  FUNCTIONAL STATUS:  She is able walk 15-20 minutes.  She is able to climb stairs and drive a car.  She does her own housework and is able to manage her ADLs without difficulty.  REVIEW OF SYSTEMS:  Negative for bowel or bladder problems, no new numbness, tingling or weakness.  Positive for depression, occasional trouble walking, spasms, night sweats, otherwise negative.  She maintains contact with Dr. Beverely Low, her primary care doctor for her other medical problems.  No other changes in past medical, social, or family history.  Her problem list includes history of arrhythmia, asthma, gastroesophageal reflux disease, irritable bowel syndrome,  osteoarthritis.  PREVIOUS SURGERIES:  Bilateral shoulder surgery, right ankle surgery for trimalleolar fracture, carpal tunnel surgery and cholecystectomy.  PHYSICAL EXAMINATION:  Today, her blood pressure is 149/85, pulse 70, respirations 16, 99% saturated on room air.  She is a mildly obese [place] American woman who does not appear in any distress.  She is oriented x3.  Speech is clear.  Her affect is bright.  She is alert, cooperative, and pleasant.  Follows commands without difficulty. Answers my questions appropriately.  Cranial nerves and coordination are grossly intact.  Her reflexes are 2+ in the upper extremities and diminished in the lower extremities.  No abnormal tone, clonus, or tremors are noted.  Motor strength is good in both upper and lower extremities without focal deficit.  Transitions from sitting to standing easily, slight difficulty with tandem gait.  Romberg test is performed adequately. She has relatively well-preserved range of motion in her neck and shoulder.  She does complain of some mild discomfort with left shoulder abduction.  Flexion and extension of her lumbar spine are relatively well preserved today as well.  She has some tenderness to palpation in cervical and lumbar regions.  IMPRESSION: 1. History of fibromyalgia. 2. History of thoracolumbar spondylosis. 3. History of cervical spondylosis. 4. History of mild right hip osteoarthritis. 5. History of right ankle trimalleolar fracture. 6. Bilateral trochanteric bursitis, this is stable. 7. Status post left and right surgery; left 3 years ago and right     surgery 2 months ago per Dr. Rennis Chris.  PLAN:  The patient reports relatively good relief with  using p.r.n. Naprosyn, she takes it once to twice or so every other day.  She also uses Robaxin about once every other day as well.  She finds both these medications to be quite helpful.  I have recommended she participate in a Pool program.  She is  going to look into it at the [employer] [employer].  She has a low B12 level which is in the low normal range and I am going to go ahead and check a methylmalonic acid level on her as well.  I have asked her to maintain contact with Dr. Beverely Low for her other medical problems.  I have not prescribed any medications for her.  She has requested to minimize oral medications.  I went over the risks and benefits of the medication she is currently taking; however, which are not prescribed through our office, and I have answered her questions. She is comfortable with our plan at this point.  I would like to see her a bit more active, she understands this and she is going to get try to get involved in the [facility] program.  I will see her back in 4 months.     Brantley Stage, M.D. Electronically Signed    DMK/MedQ D:  01/22/2011 10:24:36  T:  01/23/2011 00:17:00  Job #:  [id number]

## 2011-02-21 ENCOUNTER — Other Ambulatory Visit: Payer: Self-pay | Admitting: Internal Medicine

## 2011-03-05 ENCOUNTER — Other Ambulatory Visit: Payer: Self-pay | Admitting: Family Medicine

## 2011-03-06 ENCOUNTER — Other Ambulatory Visit: Payer: Self-pay | Admitting: Family Medicine

## 2011-03-06 NOTE — Telephone Encounter (Signed)
rx sent to pharmacy by e-script No refills per pt due for office visit to verify medications are working properly per need blood work

## 2011-03-06 NOTE — Telephone Encounter (Signed)
Manually faxed to pharmacy per order did not go through earlier

## 2011-03-20 ENCOUNTER — Other Ambulatory Visit: Payer: Self-pay | Admitting: Family Medicine

## 2011-03-21 NOTE — Telephone Encounter (Signed)
rx sent to pharmacy by e-script  

## 2011-04-11 ENCOUNTER — Other Ambulatory Visit: Payer: Self-pay | Admitting: Family Medicine

## 2011-04-11 MED ORDER — AMLODIPINE BESYLATE-VALSARTAN 5-160 MG PO TABS: 1.0000 | ORAL_TABLET | Freq: Every day | ORAL | Status: DC

## 2011-04-11 NOTE — Telephone Encounter (Signed)
rx sent to pharmacy by e-script  

## 2011-04-18 DIAGNOSIS — M159 Polyosteoarthritis, unspecified: Secondary | ICD-10-CM | POA: Diagnosis not present

## 2011-04-18 DIAGNOSIS — IMO0001 Reserved for inherently not codable concepts without codable children: Secondary | ICD-10-CM | POA: Diagnosis not present

## 2011-04-18 DIAGNOSIS — M25529 Pain in unspecified elbow: Secondary | ICD-10-CM | POA: Diagnosis not present

## 2011-04-18 DIAGNOSIS — IMO0002 Reserved for concepts with insufficient information to code with codable children: Secondary | ICD-10-CM | POA: Diagnosis not present

## 2011-04-19 ENCOUNTER — Encounter: Payer: Self-pay | Admitting: Family Medicine

## 2011-04-19 ENCOUNTER — Ambulatory Visit (INDEPENDENT_AMBULATORY_CARE_PROVIDER_SITE_OTHER): Payer: Medicare Other | Admitting: Family Medicine

## 2011-04-19 DIAGNOSIS — M545 Low back pain: Secondary | ICD-10-CM

## 2011-04-19 DIAGNOSIS — E785 Hyperlipidemia, unspecified: Secondary | ICD-10-CM

## 2011-04-19 DIAGNOSIS — J3081 Allergic rhinitis due to animal (cat) (dog) hair and dander: Secondary | ICD-10-CM | POA: Diagnosis not present

## 2011-04-19 DIAGNOSIS — I10 Essential (primary) hypertension: Secondary | ICD-10-CM

## 2011-04-19 DIAGNOSIS — F329 Major depressive disorder, single episode, unspecified: Secondary | ICD-10-CM

## 2011-04-19 LAB — BASIC METABOLIC PANEL
CO2: 32 mEq/L (ref 19–32)
Calcium: 8.7 mg/dL (ref 8.4–10.5)
Creatinine, Ser: 0.8 mg/dL (ref 0.4–1.2)
Sodium: 143 mEq/L (ref 135–145)

## 2011-04-19 LAB — HEPATIC FUNCTION PANEL
Alkaline Phosphatase: 95 U/L (ref 39–117)
Bilirubin, Direct: 0 mg/dL (ref 0.0–0.3)
Total Bilirubin: 0.2 mg/dL — ABNORMAL LOW (ref 0.3–1.2)

## 2011-04-19 LAB — LIPID PANEL
HDL: 49.4 mg/dL (ref >39.00)
LDL Cholesterol: 57 mg/dL (ref 0–99)
Total CHOL/HDL Ratio: 3
Triglycerides: 106 mg/dL (ref 0.0–149.0)
VLDL: 21.2 mg/dL (ref 0.0–40.0)

## 2011-04-19 MED ORDER — VENLAFAXINE HCL ER 37.5 MG PO CP24: 37.5000 mg | ORAL_CAPSULE | Freq: Every day | ORAL | Status: DC

## 2011-04-19 MED ORDER — FLUTICASONE PROPIONATE 50 MCG/ACT NA SUSP: 2.0000 | Freq: Every day | NASAL | Status: DC

## 2011-04-19 NOTE — Patient Instructions (Signed)
Follow up in 1 month to recheck mood Start the Effexor daily in the morning- if you have any problem w/ this, call me! We'll notify you of your lab results and make any changes if needed Start the flonase- 2 sprays in each nostril daily Continue the Claritin daily Keep up the good work!  You look good! Happy New Year!!!

## 2011-04-19 NOTE — Assessment & Plan Note (Signed)
New.  Continue Claritin.  Add Flonase.  Allergen avoidance is not option at this time.  Stressed importance of clean home and keeping dogs off bed- pt in agreement.

## 2011-04-19 NOTE — Assessment & Plan Note (Signed)
Chronic problem.  Following w/ ortho and pain management.  Pt feels that she is not getting much out of pain management and cost is prohibitive.  Feels her sxs are improving w/ walking regularly and NSAID/Robaxin combo.  Will follow.

## 2011-04-19 NOTE — Assessment & Plan Note (Signed)
Chronic problem.  Well controlled on current meds.  No changes. 

## 2011-04-19 NOTE — Assessment & Plan Note (Signed)
Deteriorated.  Restart low dose SSRI for better control.  Reviewed supportive care and red flags that should prompt return.  Pt expressed understanding and is in agreement w/ plan.

## 2011-04-19 NOTE — Assessment & Plan Note (Signed)
Chronic problem.  Tolerating statin w/out difficulty.  Check labs.  Adjust meds prn  

## 2011-04-19 NOTE — Progress Notes (Signed)
  Subjective:    Patient ID: Tracey Evans, female    DOB: 04-Sep-1945, 66 y.o.   MRN: [id number]  HPI HTN- chronic problem, well controlled.  On Metoprolol, Exforge.  No CP, SOB, HAs, edema.  Hyperlipidemia- chronic problem.  Due for labs.  On Lipitor.  No abd pain, N/V, myalgias.  Dog Allergies- now in possession of 2 dogs while niece is deployed.  + nasal congestion, PND.  Started OTC Claritin w/ some relief.  Thoracic spasm- taking Naproxen, Robaxin.  This combo works well.  Will have spasm if turning a certain way.  Seeing ortho.  Is more active due to walking the dogs.  Wants to stop pain management- doesn't feel Dr Lanae Crumbly is helping.  Depression- worse over the last 3 weeks.  Did not get out of bed x3 days.  Has hx of this.  Was able to 'get myself out of it by going to [facility], getting some yarn, and starting crochet'.  Not currently on meds.  Only on xanax as needed.  Has previously been on cymbalta and effexor- 'we don't go together'.      Review of Systems For ROS see HPI     Objective:   Physical Exam  Vitals reviewed. Constitutional: She is oriented to person, place, and time. She appears well-developed and well-nourished. No distress.  HENT:  Head: Normocephalic and atraumatic.       Edematous turbinates bilaterally + PND  Eyes: Conjunctivae and EOM are normal. Pupils are equal, round, and reactive to light.  Neck: Normal range of motion. Neck supple. No thyromegaly present.  Cardiovascular: Normal rate, regular rhythm, normal heart sounds and intact distal pulses.   No murmur heard. Pulmonary/Chest: Effort normal and breath sounds normal. No respiratory distress.  Abdominal: Soft. She exhibits no distension. There is no tenderness.  Musculoskeletal: She exhibits no edema.  Lymphadenopathy:    She has no cervical adenopathy.  Neurological: She is alert and oriented to person, place, and time.  Skin: Skin is warm and dry.  Psychiatric: She has a normal mood  and affect. Her behavior is normal.          Assessment & Plan:

## 2011-04-20 ENCOUNTER — Encounter: Payer: Self-pay | Admitting: *Deleted

## 2011-04-23 ENCOUNTER — Encounter
Payer: Medicare Other | Attending: Physical Medicine and Rehabilitation | Admitting: Physical Medicine and Rehabilitation

## 2011-04-23 ENCOUNTER — Telehealth: Payer: Self-pay | Admitting: *Deleted

## 2011-04-23 DIAGNOSIS — M169 Osteoarthritis of hip, unspecified: Secondary | ICD-10-CM | POA: Insufficient documentation

## 2011-04-23 DIAGNOSIS — M47812 Spondylosis without myelopathy or radiculopathy, cervical region: Secondary | ICD-10-CM | POA: Insufficient documentation

## 2011-04-23 DIAGNOSIS — M1610 Unilateral primary osteoarthritis, unspecified hip: Secondary | ICD-10-CM | POA: Insufficient documentation

## 2011-04-23 DIAGNOSIS — Z8781 Personal history of (healed) traumatic fracture: Secondary | ICD-10-CM | POA: Insufficient documentation

## 2011-04-23 DIAGNOSIS — G894 Chronic pain syndrome: Secondary | ICD-10-CM | POA: Diagnosis not present

## 2011-04-23 DIAGNOSIS — M47814 Spondylosis without myelopathy or radiculopathy, thoracic region: Secondary | ICD-10-CM | POA: Diagnosis not present

## 2011-04-23 DIAGNOSIS — Z79899 Other long term (current) drug therapy: Secondary | ICD-10-CM | POA: Insufficient documentation

## 2011-04-23 DIAGNOSIS — M546 Pain in thoracic spine: Secondary | ICD-10-CM

## 2011-04-23 DIAGNOSIS — IMO0001 Reserved for inherently not codable concepts without codable children: Secondary | ICD-10-CM | POA: Insufficient documentation

## 2011-04-23 NOTE — Telephone Encounter (Signed)
Letter has been mailed to pt address noted in the chart to advise they are overdue for cpe/ov/labs and the pt needs to contact office to set up appt   

## 2011-04-24 NOTE — Assessment & Plan Note (Signed)
HISTORY OF PRESENT ILLNESS:  Tracey Evans is a pleasant 66 year old African American woman who was last seen by me January 22, 2011.  On her initial visit in August 2012, she had multiple pain complaints throughout her scapula, midback, low back, and lower extremities. Today, she is complaining only of some discomfort along the right flank. She has known thoracic degenerative changes.  Her average pain is between 6-7 on a scale of 10, described as dull, constant, worse with bending, improves with rest.  She reports good relief with current meds.  She has been using naproxen about twice a week and Robaxin about twice a week and was recently placed on some Effexor by her primary care.  She reports good relief with these current medications she has discontinued her tramadol and oxycodone completely now.  FUNCTIONAL STATUS:  She can walk 15-20 minutes.  She is able to climb stairs and drive.  She is independent with self-care.  She recently started taking care of her 2 dogs.  She tells me they may help get her out of the house and walking on a daily basis and she finds this to be quite beneficial.  Denies problems with bowel or bladder.  Denies anxiety or suicidal ideation.  Does report some occasional depression.  Past medical, social, family history, otherwise unchanged.  PHYSICAL EXAMINATION:  VITAL SIGNS:  Her blood pressure is 131/85, pulse 76, respirations 18, 95% saturated on room air.  She is 5 feet 5 inches tall and 211 pounds. GENERAL:  She is a well-developed, well-nourished, African American woman who does not appear in any distress.  She is oriented x3. NEUROLOGIC:  Speech is clear.  Her affect is bright.  She is alert, cooperative, and pleasant.  Follows commands without difficulty. Answers my questions appropriately.  Cranial nerves to coordination are intact.  Reflexes are 2+ in the upper and lower extremities.  No abnormal tone, clonus, or tremors are noted.   Her motor strength is good in both upper and lower extremities without focal deficit.  No sensory deficits are appreciated.  She transitions easily from sitting to standing.  Gait is not antalgic.  Tandem gait and Romberg tests are all performed adequately.  She has mild limitations with cervical range of motion.  Lumbar motion is minimally limited.  Her gait is stable.  IMPRESSION: 1. History of fibromyalgia. 2. History of thoracolumbar spondylosis with some current radiation     into the right flank region.  There is no cerebrovascular accident     tenderness. 3. History of cervical spondylosis. 4. History of mild right hip osteoarthritis. 5. History of right ankle trimalleolar fracture. 6. Bilateral trochanteric bursitis, currently not a problem.  Overall, [name] has been doing well over the last several months. She has had a bout of depression and was treated with some Effexor per her primary care.  She has been more active walking her dogs. Methylmalonic acid was checked at the last visit, was within normal limits, 139, range between 87/318.  She is a currently using Naprosyn and Robaxin on a p.r.n. basis, not more than twice a week for each of these and seems to be quite content with the pain management she is getting from them.  She is off her narcotic pain medication, including tramadol as well as oxycodone.  She is comfortable with this plan currently and the current medications that she is taking. I have answered all her questions.  She has requested to come back on a  p.r.n. basis.     Brantley Stage, M.D. Electronically Signed    DMK/MedQ D:  04/23/2011 13:32:40  T:  04/24/2011 06:20:51  Job #:  [id number]

## 2011-05-02 ENCOUNTER — Telehealth: Payer: Self-pay | Admitting: Family Medicine

## 2011-05-02 NOTE — Telephone Encounter (Signed)
Gave pt instructions from MD Tabori to try to switch taking medication at night, pt understood and will try that

## 2011-05-02 NOTE — Telephone Encounter (Signed)
This medicine should not be causing fatigue- if it is, try taking it at night and see if this works better.

## 2011-05-02 NOTE — Telephone Encounter (Signed)
The patient called and stated the Effexor XR med is making her sleepy and she is unable to keep taking it.  She is hoping for a call back to discuss what other options she has.  Thanks!  [phone]

## 2011-05-02 NOTE — Telephone Encounter (Signed)
Last OV 04-19-11

## 2011-05-07 DIAGNOSIS — IMO0001 Reserved for inherently not codable concepts without codable children: Secondary | ICD-10-CM | POA: Diagnosis not present

## 2011-05-07 DIAGNOSIS — E7800 Pure hypercholesterolemia, unspecified: Secondary | ICD-10-CM | POA: Diagnosis not present

## 2011-05-07 DIAGNOSIS — H538 Other visual disturbances: Secondary | ICD-10-CM | POA: Diagnosis not present

## 2011-05-07 DIAGNOSIS — I10 Essential (primary) hypertension: Secondary | ICD-10-CM | POA: Diagnosis not present

## 2011-05-07 DIAGNOSIS — H2510 Age-related nuclear cataract, unspecified eye: Secondary | ICD-10-CM | POA: Diagnosis not present

## 2011-05-07 DIAGNOSIS — H04129 Dry eye syndrome of unspecified lacrimal gland: Secondary | ICD-10-CM | POA: Diagnosis not present

## 2011-05-11 ENCOUNTER — Other Ambulatory Visit: Payer: Self-pay | Admitting: Family Medicine

## 2011-05-11 MED ORDER — AMLODIPINE BESYLATE-VALSARTAN 5-160 MG PO TABS: 1.0000 | ORAL_TABLET | Freq: Every day | ORAL | Status: DC

## 2011-05-11 NOTE — Telephone Encounter (Signed)
rx sent to pharmacy by e-script  

## 2011-05-16 ENCOUNTER — Other Ambulatory Visit: Payer: Self-pay | Admitting: Family Medicine

## 2011-05-16 DIAGNOSIS — Z1231 Encounter for screening mammogram for malignant neoplasm of breast: Secondary | ICD-10-CM

## 2011-05-21 ENCOUNTER — Ambulatory Visit (INDEPENDENT_AMBULATORY_CARE_PROVIDER_SITE_OTHER): Payer: Medicare Other | Admitting: Family Medicine

## 2011-05-21 ENCOUNTER — Encounter: Payer: Self-pay | Admitting: Family Medicine

## 2011-05-21 DIAGNOSIS — F329 Major depressive disorder, single episode, unspecified: Secondary | ICD-10-CM | POA: Diagnosis not present

## 2011-05-21 MED ORDER — LEVOTHYROXINE SODIUM 50 MCG PO TABS: 50.0000 ug | ORAL_TABLET | Freq: Every day | ORAL | Status: DC

## 2011-05-21 NOTE — Progress Notes (Signed)
  Subjective:    Patient ID: Tracey Evans, female    DOB: [birth year], 66 y.o.   MRN: [id number]  HPI Depression- stopped the Effexor after 2 weeks b/c of sleep eating.  Also reported increased amounts of sleep.  Pt reports mood has dramatically improved.  No longer tearful, having sadness.  Reports she will still have a down 'day or 2' but 'nothing that's lasting'.   Review of Systems For ROS see HPI     Objective:   Physical Exam  Vitals reviewed. Constitutional: She appears well-developed and well-nourished. No distress.  HENT:  Head: Normocephalic and atraumatic.  Psychiatric: She has a normal mood and affect. Her behavior is normal. Thought content normal.          Assessment & Plan:

## 2011-05-21 NOTE — Patient Instructions (Signed)
Schedule your complete physical for July- don't eat before this appt You look great!  Keep up the good work! Call with any questions or concerns Hang in there!!

## 2011-05-21 NOTE — Assessment & Plan Note (Signed)
Improved.  Pt did not tolerate Effexor but reports sxs have resolved on their own.  This is a recurrent problem for pt- will continue to follow.

## 2011-05-31 DIAGNOSIS — H04129 Dry eye syndrome of unspecified lacrimal gland: Secondary | ICD-10-CM | POA: Diagnosis not present

## 2011-06-05 ENCOUNTER — Ambulatory Visit
Admission: RE | Admit: 2011-06-05 | Discharge: 2011-06-05 | Disposition: A | Discharge status: Home / Self Care | Payer: PRIVATE HEALTH INSURANCE | Source: Ambulatory Visit | Attending: Family Medicine | Admitting: Family Medicine

## 2011-06-05 DIAGNOSIS — Z1231 Encounter for screening mammogram for malignant neoplasm of breast: Secondary | ICD-10-CM

## 2011-07-30 DIAGNOSIS — H04129 Dry eye syndrome of unspecified lacrimal gland: Secondary | ICD-10-CM | POA: Diagnosis not present

## 2011-08-06 DIAGNOSIS — I10 Essential (primary) hypertension: Secondary | ICD-10-CM | POA: Diagnosis not present

## 2011-08-06 DIAGNOSIS — IMO0001 Reserved for inherently not codable concepts without codable children: Secondary | ICD-10-CM | POA: Diagnosis not present

## 2011-08-06 DIAGNOSIS — E7800 Pure hypercholesterolemia, unspecified: Secondary | ICD-10-CM | POA: Diagnosis not present

## 2011-08-09 DIAGNOSIS — M25579 Pain in unspecified ankle and joints of unspecified foot: Secondary | ICD-10-CM | POA: Diagnosis not present

## 2011-08-11 DIAGNOSIS — M25579 Pain in unspecified ankle and joints of unspecified foot: Secondary | ICD-10-CM | POA: Diagnosis not present

## 2011-08-16 DIAGNOSIS — M25579 Pain in unspecified ankle and joints of unspecified foot: Secondary | ICD-10-CM | POA: Diagnosis not present

## 2011-08-20 DIAGNOSIS — M25579 Pain in unspecified ankle and joints of unspecified foot: Secondary | ICD-10-CM | POA: Diagnosis not present

## 2011-08-21 DIAGNOSIS — M25579 Pain in unspecified ankle and joints of unspecified foot: Secondary | ICD-10-CM | POA: Diagnosis not present

## 2011-08-22 DIAGNOSIS — M25579 Pain in unspecified ankle and joints of unspecified foot: Secondary | ICD-10-CM | POA: Diagnosis not present

## 2011-08-24 DIAGNOSIS — M25579 Pain in unspecified ankle and joints of unspecified foot: Secondary | ICD-10-CM | POA: Diagnosis not present

## 2011-08-27 DIAGNOSIS — M25579 Pain in unspecified ankle and joints of unspecified foot: Secondary | ICD-10-CM | POA: Diagnosis not present

## 2011-08-29 DIAGNOSIS — M25579 Pain in unspecified ankle and joints of unspecified foot: Secondary | ICD-10-CM | POA: Diagnosis not present

## 2011-08-31 DIAGNOSIS — M25579 Pain in unspecified ankle and joints of unspecified foot: Secondary | ICD-10-CM | POA: Diagnosis not present

## 2011-09-04 ENCOUNTER — Other Ambulatory Visit: Payer: Self-pay | Admitting: Family Medicine

## 2011-09-04 MED ORDER — AMLODIPINE BESYLATE-VALSARTAN 5-160 MG PO TABS: 1.0000 | ORAL_TABLET | Freq: Every day | ORAL | Status: DC

## 2011-09-04 NOTE — Telephone Encounter (Signed)
refill exforge 5-160mg  tablet Qty 30 Take one tablet by mouth daily  Last filled 4.25.13 Last OV 2.11.13

## 2011-09-05 DIAGNOSIS — M25579 Pain in unspecified ankle and joints of unspecified foot: Secondary | ICD-10-CM | POA: Diagnosis not present

## 2011-10-03 ENCOUNTER — Telehealth: Payer: Self-pay | Admitting: Family Medicine

## 2011-10-03 MED ORDER — ATORVASTATIN CALCIUM 20 MG PO TABS: 20.0000 mg | ORAL_TABLET | Freq: Every day | ORAL | Status: DC

## 2011-10-03 NOTE — Telephone Encounter (Signed)
Refill: Atorvastatin 20mg  tablet. Take 1 tablet daily. Qty 30. Last fill 09-02-11

## 2011-10-03 NOTE — Telephone Encounter (Signed)
rx sent to pharmacy by e-script for #30 no refill, pt has upcoming OV in July

## 2011-10-16 DIAGNOSIS — M159 Polyosteoarthritis, unspecified: Secondary | ICD-10-CM | POA: Diagnosis not present

## 2011-10-16 DIAGNOSIS — IMO0001 Reserved for inherently not codable concepts without codable children: Secondary | ICD-10-CM | POA: Diagnosis not present

## 2011-10-16 DIAGNOSIS — IMO0002 Reserved for concepts with insufficient information to code with codable children: Secondary | ICD-10-CM | POA: Diagnosis not present

## 2011-10-22 ENCOUNTER — Ambulatory Visit (INDEPENDENT_AMBULATORY_CARE_PROVIDER_SITE_OTHER): Payer: Medicare Other | Admitting: Family Medicine

## 2011-10-22 ENCOUNTER — Encounter: Payer: Self-pay | Admitting: Family Medicine

## 2011-10-22 VITALS — BP 128/77 | HR 74 | Temp 97.9°F | Ht 65.25 in | Wt 218.0 lb

## 2011-10-22 DIAGNOSIS — Z23 Encounter for immunization: Secondary | ICD-10-CM | POA: Diagnosis not present

## 2011-10-22 DIAGNOSIS — E785 Hyperlipidemia, unspecified: Secondary | ICD-10-CM

## 2011-10-22 DIAGNOSIS — E039 Hypothyroidism, unspecified: Secondary | ICD-10-CM | POA: Diagnosis not present

## 2011-10-22 DIAGNOSIS — Z780 Asymptomatic menopausal state: Secondary | ICD-10-CM

## 2011-10-22 DIAGNOSIS — I10 Essential (primary) hypertension: Secondary | ICD-10-CM

## 2011-10-22 DIAGNOSIS — Z0000 Encounter for general adult medical examination without abnormal findings: Secondary | ICD-10-CM | POA: Diagnosis not present

## 2011-10-22 LAB — LIPID PANEL
HDL: 46.2 mg/dL (ref >39.00)
LDL Cholesterol: 53 mg/dL (ref 0–99)
Total CHOL/HDL Ratio: 3
VLDL: 20 mg/dL (ref 0.0–40.0)

## 2011-10-22 LAB — CBC WITH DIFFERENTIAL/PLATELET
Basophils Absolute: 0 10*3/uL (ref 0.0–0.1)
Eosinophils Relative: 3.6 % (ref 0.0–5.0)
HCT: 39.7 % (ref 36.0–46.0)
Lymphocytes Relative: 45.6 % (ref 12.0–46.0)
Lymphs Abs: 3.2 10*3/uL (ref 0.7–4.0)
Monocytes Relative: 7.8 % (ref 3.0–12.0)
Neutrophils Relative %: 42.5 % — ABNORMAL LOW (ref 43.0–77.0)
Platelets: 278 10*3/uL (ref 150.0–400.0)
RDW: 13.7 % (ref 11.5–14.6)
WBC: 7 10*3/uL (ref 4.5–10.5)

## 2011-10-22 LAB — BASIC METABOLIC PANEL
CO2: 31 mEq/L (ref 19–32)
Calcium: 9.1 mg/dL (ref 8.4–10.5)
Potassium: 3.9 mEq/L (ref 3.5–5.1)
Sodium: 143 mEq/L (ref 135–145)

## 2011-10-22 LAB — HEPATIC FUNCTION PANEL
AST: 22 U/L (ref 0–37)
Alkaline Phosphatase: 84 U/L (ref 39–117)
Bilirubin, Direct: 0 mg/dL (ref 0.0–0.3)
Total Protein: 6.9 g/dL (ref 6.0–8.3)

## 2011-10-22 NOTE — Progress Notes (Signed)
  Subjective:    Patient ID: Tracey Evans, female    DOB: [birth year], 66 y.o.   MRN: [id number]  HPI Here today for CPE.  Risk Factors: Hypothyroid- chronic problem, on Synthroid.  Denies heat/cold intolerance, changes to hair/nails. Hyperlipidemia- chronic problem, on Lipitor.  Denies abd pain, N/V, myalgias. HTN- chronic problem, well controlled.  On Exforge, metoprolol.  Denies CP, SOB, HAs, visual changes, edema Physical Activity: limited formal exercise but active. Fall Risk: low risk, very steady on feet. Depression: chronic problem, on Effexor and xanax.  Ex-husband's wife died last week and she had to increase her xanax to TID. Hearing: normal to conversational tones and whispered voice at 6 ft ADL's: independent Cognitive: normal linear thought process, memory and attention intact Home Safety: safe at home Height, Weight, BMI, Visual Acuity: see vitals, vision corrected to 20/20 w/ glasses Counseling: UTD on mammo, colonoscopy.  Due for DEXA. Labs Ordered: See A&P Care Plan: See A&P    Review of Systems Patient reports no vision/ hearing changes, adenopathy, fever, weight change,  persistant/recurrent hoarseness , swallowing issues, chest pain, palpitations, persistant/recurrent cough, hemoptysis, dyspnea (rest/exertional/paroxysmal nocturnal), gastrointestinal bleeding (melena, rectal bleeding), abdominal pain, significant heartburn, bowel changes, GU symptoms (dysuria, hematuria, incontinence), Gyn symptoms (abnormal  bleeding, pain),  syncope, focal weakness, memory loss, numbness & tingling, skin/hair/nail changes, abnormal bruising or bleeding.   + ankle edema    Objective:   Physical Exam General Appearance:    Alert, cooperative, no distress, appears stated age  Head:    Normocephalic, without obvious abnormality, atraumatic  Eyes:    PERRL, conjunctiva/corneas clear, EOM's intact, fundi    benign, both eyes  Ears:    Normal TM's and external ear canals, both  ears  Nose:   Nares normal, septum midline, mucosa normal, no drainage    or sinus tenderness  Throat:   Lips, mucosa, and tongue normal; teeth and gums normal  Neck:   Supple, symmetrical, trachea midline, no adenopathy;    Thyroid: no enlargement/tenderness/nodules  Back:     Symmetric, no curvature, ROM normal, no CVA tenderness  Lungs:     Clear to auscultation bilaterally, respirations unlabored  Chest Wall:    No tenderness or deformity   Heart:    Regular rate and rhythm, S1 and S2 normal, no murmur, rub   or gallop  Breast Exam:    Deferred to GYN  Abdomen:     Soft, non-tender, bowel sounds active all four quadrants,    no masses, no organomegaly  Genitalia:    Deferred to GYN  Rectal:    Extremities:   Extremities normal, atraumatic, no cyanosis or edema  Pulses:   2+ and symmetric all extremities  Skin:   Skin color, texture, turgor normal, no rashes or lesions  Lymph nodes:   Cervical, supraclavicular, and axillary nodes normal  Neurologic:   CNII-XII intact, normal strength, sensation and reflexes    throughout          Assessment & Plan:

## 2011-10-22 NOTE — Patient Instructions (Addendum)
Follow up in 6 months to recheck BP and cholesterol, sooner if needed We'll notify you of your lab results and make any changes if needed Try and get regular exercise and make healthy food choices We'll call you with your bone density appt Call with any questions or concerns Daisey Must w/ Surgery!!!

## 2011-10-23 NOTE — Assessment & Plan Note (Signed)
Chronic problem.  Tolerating meds w/out difficulty.  Due for labs.  Adjust prn.

## 2011-10-23 NOTE — Assessment & Plan Note (Signed)
Chronic problem.  Adequate control.  Asymptomatic.  Check labs.  Continue current meds.

## 2011-10-23 NOTE — Assessment & Plan Note (Signed)
Chronic problem.  Currently denies sxs.  Check labs.  Adjust meds prn.

## 2011-10-23 NOTE — Assessment & Plan Note (Signed)
Pt's PE WNL w/ exception of obesity.  UTD on mammo, colonoscopy- due for DEXA.  Referral made.  Check labs.  Anticipatory guidance provided.

## 2011-10-26 LAB — HEMOGLOBIN A1C: Hgb A1c MFr Bld: 6.4 % (ref 4.6–6.5)

## 2011-10-30 ENCOUNTER — Ambulatory Visit
Admission: RE | Admit: 2011-10-30 | Discharge: 2011-10-30 | Disposition: A | Discharge status: Home / Self Care | Payer: Medicare Other | Source: Ambulatory Visit | Attending: Family Medicine | Admitting: Family Medicine

## 2011-10-30 ENCOUNTER — Encounter: Payer: Self-pay | Admitting: *Deleted

## 2011-10-30 DIAGNOSIS — Z780 Asymptomatic menopausal state: Secondary | ICD-10-CM | POA: Diagnosis not present

## 2011-10-31 ENCOUNTER — Encounter: Payer: Self-pay | Admitting: *Deleted

## 2011-11-01 ENCOUNTER — Telehealth: Payer: Self-pay | Admitting: Family Medicine

## 2011-11-01 MED ORDER — ATORVASTATIN CALCIUM 20 MG PO TABS: 20.0000 mg | ORAL_TABLET | Freq: Every day | ORAL | Status: DC

## 2011-11-01 MED ORDER — AMLODIPINE BESYLATE-VALSARTAN 5-160 MG PO TABS: 1.0000 | ORAL_TABLET | Freq: Every day | ORAL | Status: DC

## 2011-11-01 NOTE — Telephone Encounter (Signed)
Refill: Atorvastatin 20mg  tablet. Take 1 tablet daily. Qty 30. Last fill 09-02-11 Exforge 5-160mg  tablet. Take 1 tablet by mouth daily. Qty 30. Last fill 08-05-11

## 2011-11-01 NOTE — Telephone Encounter (Signed)
rx sent to pharmacy by e-script  

## 2011-11-05 DIAGNOSIS — I10 Essential (primary) hypertension: Secondary | ICD-10-CM | POA: Diagnosis not present

## 2011-11-05 DIAGNOSIS — R7309 Other abnormal glucose: Secondary | ICD-10-CM | POA: Diagnosis not present

## 2011-11-05 DIAGNOSIS — IMO0001 Reserved for inherently not codable concepts without codable children: Secondary | ICD-10-CM | POA: Diagnosis not present

## 2011-11-05 DIAGNOSIS — E039 Hypothyroidism, unspecified: Secondary | ICD-10-CM | POA: Diagnosis not present

## 2011-11-05 DIAGNOSIS — E7800 Pure hypercholesterolemia, unspecified: Secondary | ICD-10-CM | POA: Diagnosis not present

## 2011-11-14 ENCOUNTER — Telehealth: Payer: Self-pay | Admitting: *Deleted

## 2011-11-14 NOTE — Telephone Encounter (Signed)
Mailed copy of pt recent Dexa Scan ( bone density) noted by MD Tabori as normal

## 2011-11-21 ENCOUNTER — Other Ambulatory Visit: Payer: Self-pay | Admitting: Family Medicine

## 2011-11-21 NOTE — Telephone Encounter (Signed)
rx sent to pharmacy by e-script  

## 2011-11-22 ENCOUNTER — Encounter: Payer: Self-pay | Admitting: Family Medicine

## 2011-12-24 ENCOUNTER — Telehealth: Payer: Self-pay | Admitting: Family Medicine

## 2011-12-24 NOTE — Telephone Encounter (Signed)
refill levothyroxine 50 mcg tablet #30 wt-6/refills take one tablet by mouth daily last fill 8.18.13 Last ov V70 7.15.13

## 2011-12-25 ENCOUNTER — Other Ambulatory Visit: Payer: Self-pay | Admitting: Family Medicine

## 2011-12-25 ENCOUNTER — Telehealth: Payer: Self-pay | Admitting: Family Medicine

## 2011-12-25 MED ORDER — LEVOTHYROXINE SODIUM 50 MCG PO TABS: 50.0000 ug | ORAL_TABLET | Freq: Every day | ORAL | Status: DC

## 2011-12-25 NOTE — Telephone Encounter (Signed)
Pt called and lm on triage line that she needs a prescription for a lift chair States she has spoken to her rheumatologist and her orthopedic dr & they both refuse She is currently using a desk chair Cb# [phone]

## 2011-12-25 NOTE — Telephone Encounter (Signed)
Please advise per refusal from other MD

## 2011-12-25 NOTE — Telephone Encounter (Signed)
Sent to the pharmacy by e-scribe. 

## 2011-12-26 NOTE — Telephone Encounter (Signed)
It seems ridiculous that her specialists are refusing to write for her chair when they are treating her for the conditions that require it.  If she would like a lift chair, she will need appt to discuss this based on new medicare requirements of OV w/in 30 days.

## 2011-12-26 NOTE — Telephone Encounter (Signed)
LM on triage line re: same issue at 1118am , as per new office protocol advise best place for her on schedule and I can call pt back to schedule Thanks Cb# [phone]

## 2011-12-27 ENCOUNTER — Encounter (HOSPITAL_BASED_OUTPATIENT_CLINIC_OR_DEPARTMENT_OTHER): Payer: Self-pay | Admitting: *Deleted

## 2011-12-27 NOTE — Progress Notes (Signed)
Pt had bron spasms during intubation ankle surgery 2009-was told to use an inhaler-or have a resp treatment prior to surgery. Will bring inhaler. Does not do neb tx at home. To come in for bmet-never smoked, and asthma has been very controlled. Will bring all meds

## 2011-12-27 NOTE — Telephone Encounter (Signed)
Called pt home and noted line busy, left vm on mobile to call back to discuss the lift chair request per MD Beverely Low is happy to see her however in order for MD Tabori to write an order for the lift chair per new guidelines she would need an OV and per the other rheumatologist and orthopedic dr are currently treating her for her mobility issues they are the able to write an order for the lift chair without and office visit per she is under their current treatment for this issue

## 2011-12-27 NOTE — Telephone Encounter (Signed)
Spoke to pt to advise results/instructions. Pt understood. Pt advised that MD Tiburcio Bash should order this for her and she will speak with him because it is too hard for her to come in and she has surgery next Wednesday and will just go out and buy a recliner, advised we are glad to see her but based on medicare guidelines MD Beverely Low will need OV per she did NOT dx, pt totally in agreement and wants MD Tabori to know she is appreciative that she will see her but she has decided to no longer seek the lift chair and plans to review the issue with [place] at next OV. MD Beverely Low made aware verbally

## 2011-12-31 ENCOUNTER — Encounter (HOSPITAL_BASED_OUTPATIENT_CLINIC_OR_DEPARTMENT_OTHER)
Admission: RE | Admit: 2011-12-31 | Discharge: 2011-12-31 | Disposition: A | Discharge status: Home / Self Care | Payer: Medicare Other | Source: Ambulatory Visit | Attending: Orthopedic Surgery | Admitting: Orthopedic Surgery

## 2011-12-31 DIAGNOSIS — I10 Essential (primary) hypertension: Secondary | ICD-10-CM | POA: Diagnosis not present

## 2011-12-31 DIAGNOSIS — F329 Major depressive disorder, single episode, unspecified: Secondary | ICD-10-CM | POA: Diagnosis not present

## 2011-12-31 DIAGNOSIS — S8290XS Unspecified fracture of unspecified lower leg, sequela: Secondary | ICD-10-CM | POA: Diagnosis not present

## 2011-12-31 DIAGNOSIS — M899 Disorder of bone, unspecified: Secondary | ICD-10-CM | POA: Diagnosis not present

## 2011-12-31 DIAGNOSIS — E039 Hypothyroidism, unspecified: Secondary | ICD-10-CM | POA: Diagnosis not present

## 2011-12-31 DIAGNOSIS — J45909 Unspecified asthma, uncomplicated: Secondary | ICD-10-CM | POA: Diagnosis not present

## 2011-12-31 DIAGNOSIS — Z01812 Encounter for preprocedural laboratory examination: Secondary | ICD-10-CM | POA: Diagnosis not present

## 2011-12-31 DIAGNOSIS — M25879 Other specified joint disorders, unspecified ankle and foot: Secondary | ICD-10-CM | POA: Diagnosis not present

## 2011-12-31 DIAGNOSIS — IMO0001 Reserved for inherently not codable concepts without codable children: Secondary | ICD-10-CM | POA: Diagnosis not present

## 2011-12-31 LAB — BASIC METABOLIC PANEL
BUN: 6 mg/dL (ref 6–23)
Calcium: 9.7 mg/dL (ref 8.4–10.5)
Chloride: 108 mEq/L (ref 96–112)
Creatinine, Ser: 0.8 mg/dL (ref 0.50–1.10)
GFR calc Af Amer: 88 mL/min — ABNORMAL LOW (ref >90)
GFR calc non Af Amer: 76 mL/min — ABNORMAL LOW (ref >90)

## 2012-01-02 ENCOUNTER — Encounter (HOSPITAL_BASED_OUTPATIENT_CLINIC_OR_DEPARTMENT_OTHER): Payer: Self-pay | Admitting: Anesthesiology

## 2012-01-02 ENCOUNTER — Ambulatory Visit (HOSPITAL_BASED_OUTPATIENT_CLINIC_OR_DEPARTMENT_OTHER): Payer: Medicare Other | Admitting: Anesthesiology

## 2012-01-02 ENCOUNTER — Encounter (HOSPITAL_BASED_OUTPATIENT_CLINIC_OR_DEPARTMENT_OTHER): Payer: Self-pay

## 2012-01-02 ENCOUNTER — Ambulatory Visit (HOSPITAL_BASED_OUTPATIENT_CLINIC_OR_DEPARTMENT_OTHER)
Admission: RE | Admit: 2012-01-02 | Discharge: 2012-01-02 | Disposition: A | Discharge status: Home / Self Care | Payer: Medicare Other | Source: Ambulatory Visit | Attending: Orthopedic Surgery | Admitting: Orthopedic Surgery

## 2012-01-02 ENCOUNTER — Encounter (HOSPITAL_BASED_OUTPATIENT_CLINIC_OR_DEPARTMENT_OTHER)
Admission: RE | Disposition: A | Discharge status: Home / Self Care | Payer: Self-pay | Source: Ambulatory Visit | Attending: Orthopedic Surgery

## 2012-01-02 DIAGNOSIS — M899 Disorder of bone, unspecified: Secondary | ICD-10-CM | POA: Insufficient documentation

## 2012-01-02 DIAGNOSIS — S8290XS Unspecified fracture of unspecified lower leg, sequela: Secondary | ICD-10-CM | POA: Diagnosis not present

## 2012-01-02 DIAGNOSIS — E039 Hypothyroidism, unspecified: Secondary | ICD-10-CM | POA: Insufficient documentation

## 2012-01-02 DIAGNOSIS — M249 Joint derangement, unspecified: Secondary | ICD-10-CM | POA: Diagnosis not present

## 2012-01-02 DIAGNOSIS — M25571 Pain in right ankle and joints of right foot: Secondary | ICD-10-CM

## 2012-01-02 DIAGNOSIS — Z01812 Encounter for preprocedural laboratory examination: Secondary | ICD-10-CM | POA: Diagnosis not present

## 2012-01-02 DIAGNOSIS — I10 Essential (primary) hypertension: Secondary | ICD-10-CM | POA: Insufficient documentation

## 2012-01-02 DIAGNOSIS — M9320 Osteochondritis dissecans of unspecified site: Secondary | ICD-10-CM | POA: Diagnosis not present

## 2012-01-02 DIAGNOSIS — F329 Major depressive disorder, single episode, unspecified: Secondary | ICD-10-CM | POA: Insufficient documentation

## 2012-01-02 DIAGNOSIS — F3289 Other specified depressive episodes: Secondary | ICD-10-CM | POA: Insufficient documentation

## 2012-01-02 DIAGNOSIS — IMO0001 Reserved for inherently not codable concepts without codable children: Secondary | ICD-10-CM | POA: Insufficient documentation

## 2012-01-02 DIAGNOSIS — J45909 Unspecified asthma, uncomplicated: Secondary | ICD-10-CM | POA: Insufficient documentation

## 2012-01-02 DIAGNOSIS — M928 Other specified juvenile osteochondrosis: Secondary | ICD-10-CM | POA: Diagnosis not present

## 2012-01-02 DIAGNOSIS — M949 Disorder of cartilage, unspecified: Secondary | ICD-10-CM | POA: Diagnosis not present

## 2012-01-02 DIAGNOSIS — X58XXXS Exposure to other specified factors, sequela: Secondary | ICD-10-CM | POA: Insufficient documentation

## 2012-01-02 DIAGNOSIS — G8918 Other acute postprocedural pain: Secondary | ICD-10-CM | POA: Diagnosis not present

## 2012-01-02 DIAGNOSIS — M25579 Pain in unspecified ankle and joints of unspecified foot: Secondary | ICD-10-CM | POA: Diagnosis not present

## 2012-01-02 DIAGNOSIS — M25879 Other specified joint disorders, unspecified ankle and foot: Secondary | ICD-10-CM | POA: Insufficient documentation

## 2012-01-02 DIAGNOSIS — IMO0002 Reserved for concepts with insufficient information to code with codable children: Secondary | ICD-10-CM | POA: Diagnosis not present

## 2012-01-02 HISTORY — DX: Other complications of anesthesia, initial encounter: T88.59XA

## 2012-01-02 HISTORY — PX: ANKLE ARTHROSCOPY: SHX545

## 2012-01-02 HISTORY — DX: Adverse effect of unspecified anesthetic, initial encounter: T41.45XA

## 2012-01-02 SURGERY — ARTHROSCOPY, ANKLE
Anesthesia: General | Site: Ankle | Laterality: Right

## 2012-01-02 MED ORDER — DEXAMETHASONE SODIUM PHOSPHATE 4 MG/ML IJ SOLN
INTRAMUSCULAR | Status: DC | PRN
  Administered 2012-01-02: 10 mg via INTRAVENOUS

## 2012-01-02 MED ORDER — ONDANSETRON HCL 4 MG/2ML IJ SOLN
INTRAMUSCULAR | Status: DC | PRN
  Administered 2012-01-02: 4 mg via INTRAVENOUS

## 2012-01-02 MED ORDER — OXYCODONE HCL 5 MG PO TABS: 5.0000 mg | ORAL_TABLET | Freq: Once | ORAL | Status: DC | PRN

## 2012-01-02 MED ORDER — OXYCODONE HCL 5 MG/5ML PO SOLN: 5.0000 mg | Freq: Once | ORAL | Status: DC | PRN

## 2012-01-02 MED ORDER — LACTATED RINGERS IV SOLN
INTRAVENOUS | Status: DC
  Administered 2012-01-02 (×3): via INTRAVENOUS

## 2012-01-02 MED ORDER — HYDROMORPHONE HCL PF 1 MG/ML IJ SOLN
0.2500 mg | INTRAMUSCULAR | Status: DC | PRN
  Administered 2012-01-02 (×2): 0.25 mg via INTRAVENOUS

## 2012-01-02 MED ORDER — HYDROMORPHONE HCL 2 MG PO TABS
2.0000 mg | ORAL_TABLET | Freq: Four times a day (QID) | ORAL | Status: DC | PRN
Start: 2012-01-02 — End: 2012-01-21

## 2012-01-02 MED ORDER — CHLORHEXIDINE GLUCONATE 4 % EX LIQD: 60.0000 mL | Freq: Once | CUTANEOUS | Status: DC

## 2012-01-02 MED ORDER — CEFAZOLIN SODIUM-DEXTROSE 2-3 GM-% IV SOLR
2.0000 g | INTRAVENOUS | Status: AC
  Administered 2012-01-02: 2 g via INTRAVENOUS

## 2012-01-02 MED ORDER — INDOMETHACIN 25 MG PO CAPS: 25.0000 mg | ORAL_CAPSULE | Freq: Three times a day (TID) | ORAL | Status: AC

## 2012-01-02 MED ORDER — FENTANYL CITRATE 0.05 MG/ML IJ SOLN
50.0000 ug | Freq: Once | INTRAMUSCULAR | Status: AC
  Administered 2012-01-02: 100 ug via INTRAVENOUS

## 2012-01-02 MED ORDER — PROPOFOL 10 MG/ML IV BOLUS
INTRAVENOUS | Status: DC | PRN
  Administered 2012-01-02: 180 mg via INTRAVENOUS

## 2012-01-02 MED ORDER — SODIUM CHLORIDE 0.9 % IR SOLN
Status: DC | PRN
  Administered 2012-01-02: 5

## 2012-01-02 MED ORDER — BUPIVACAINE-EPINEPHRINE PF 0.5-1:200000 % IJ SOLN
INTRAMUSCULAR | Status: DC | PRN
  Administered 2012-01-02: 30 mL

## 2012-01-02 MED ORDER — PROMETHAZINE HCL 25 MG/ML IJ SOLN: 6.2500 mg | INTRAMUSCULAR | Status: DC | PRN

## 2012-01-02 MED ORDER — MEPERIDINE HCL 25 MG/ML IJ SOLN: 6.2500 mg | INTRAMUSCULAR | Status: DC | PRN

## 2012-01-02 MED ORDER — MIDAZOLAM HCL 2 MG/2ML IJ SOLN
1.0000 mg | INTRAMUSCULAR | Status: DC | PRN
  Administered 2012-01-02: 2 mg via INTRAVENOUS

## 2012-01-02 MED ORDER — LIDOCAINE HCL (CARDIAC) 20 MG/ML IV SOLN
INTRAVENOUS | Status: DC | PRN
  Administered 2012-01-02: 50 mg via INTRAVENOUS

## 2012-01-02 MED ORDER — VITAMIN C 500 MG PO TABS: 500.0000 mg | ORAL_TABLET | Freq: Every day | ORAL | Status: DC

## 2012-01-02 MED ORDER — ASPIRIN EC 325 MG PO TBEC: 325.0000 mg | DELAYED_RELEASE_TABLET | Freq: Two times a day (BID) | ORAL | Status: DC

## 2012-01-02 MED ORDER — SODIUM CHLORIDE 0.9 % IV SOLN: INTRAVENOUS | Status: DC

## 2012-01-02 MED ORDER — FENTANYL CITRATE 0.05 MG/ML IJ SOLN
INTRAMUSCULAR | Status: DC | PRN
  Administered 2012-01-02: 25 ug via INTRAVENOUS

## 2012-01-02 MED ORDER — METHOCARBAMOL 500 MG PO TABS: 500.0000 mg | ORAL_TABLET | Freq: Three times a day (TID) | ORAL | Status: DC

## 2012-01-02 SURGICAL SUPPLY — 55 items
BANDAGE ELASTIC 4 VELCRO ST LF (GAUZE/BANDAGES/DRESSINGS) IMPLANT
BANDAGE ELASTIC 6 VELCRO ST LF (GAUZE/BANDAGES/DRESSINGS) IMPLANT
BANDAGE GAUZE ELAST BULKY 4 IN (GAUZE/BANDAGES/DRESSINGS) IMPLANT
BLADE CUDA 2.0 (BLADE) IMPLANT
BLADE CUDA SHAVER 3.5 (BLADE) ×2 IMPLANT
BLADE SURG 15 STRL LF DISP TIS (BLADE) IMPLANT
BLADE SURG 15 STRL SS (BLADE)
BRUSH SCRUB EZ PLAIN DRY (MISCELLANEOUS) ×2 IMPLANT
BUR 3.5 LG SPHERICAL (BURR) IMPLANT
BUR CUDA 2.9 (BURR) ×2 IMPLANT
BUR GATOR 2.9 (BURR) IMPLANT
BUR OVAL 4.0 (BURR) IMPLANT
BUR SPHERICAL 2.9 (BURR) IMPLANT
BURR 3.5 LG SPHERICAL (BURR)
CANISTER OMNI JUG 16 LITER (MISCELLANEOUS) ×2 IMPLANT
CANISTER SUCTION 2500CC (MISCELLANEOUS) IMPLANT
CLOTH BEACON ORANGE TIMEOUT ST (SAFETY) ×2 IMPLANT
CUFF TOURNIQUET SINGLE 34IN LL (TOURNIQUET CUFF) IMPLANT
DRAPE ARTHROSCOPY W/POUCH 114 (DRAPES) ×2 IMPLANT
DRAPE OEC MINIVIEW 54X84 (DRAPES) IMPLANT
DRAPE SURG 17X23 STRL (DRAPES) ×2 IMPLANT
DURA STEPPER LG (CAST SUPPLIES) IMPLANT
DURA STEPPER MED (CAST SUPPLIES) IMPLANT
DURA STEPPER SML (CAST SUPPLIES) IMPLANT
ELECT REM PT RETURN 9FT ADLT (ELECTROSURGICAL) ×2
ELECTRODE REM PT RTRN 9FT ADLT (ELECTROSURGICAL) ×1 IMPLANT
GAUZE XEROFORM 1X8 LF (GAUZE/BANDAGES/DRESSINGS) ×2 IMPLANT
GLOVE BIO SURGEON STRL SZ8 (GLOVE) ×2 IMPLANT
GLOVE BIOGEL PI IND STRL 8 (GLOVE) ×2 IMPLANT
GLOVE BIOGEL PI INDICATOR 8 (GLOVE) ×2
GLOVE SURG SS PI 8.0 STRL IVOR (GLOVE) ×2 IMPLANT
GOWN BRE IMP PREV XXLGXLNG (GOWN DISPOSABLE) ×2 IMPLANT
GOWN PREVENTION PLUS XLARGE (GOWN DISPOSABLE) ×4 IMPLANT
NS IRRIG 1000ML POUR BTL (IV SOLUTION) IMPLANT
PACK ARTHROSCOPY DSU (CUSTOM PROCEDURE TRAY) ×2 IMPLANT
PACK BASIN DAY SURGERY FS (CUSTOM PROCEDURE TRAY) ×2 IMPLANT
PADDING CAST ABS 4INX4YD NS (CAST SUPPLIES) ×1
PADDING CAST ABS COTTON 4X4 ST (CAST SUPPLIES) ×1 IMPLANT
PENCIL BUTTON HOLSTER BLD 10FT (ELECTRODE) IMPLANT
SPONGE GAUZE 4X4 12PLY (GAUZE/BANDAGES/DRESSINGS) ×2 IMPLANT
STOCKINETTE 6  STRL (DRAPES) ×1
STOCKINETTE 6 STRL (DRAPES) ×1 IMPLANT
STRAP ANKLE FOOT DISTRACTOR (ORTHOPEDIC SUPPLIES) IMPLANT
SUCTION FRAZIER TIP 10 FR DISP (SUCTIONS) IMPLANT
SUT ETHILON 4 0 PS 2 18 (SUTURE) ×2 IMPLANT
SUT VIC AB 3-0 PS1 18 (SUTURE)
SUT VIC AB 3-0 PS1 18XBRD (SUTURE) IMPLANT
SYR BULB 3OZ (MISCELLANEOUS) IMPLANT
TOWEL OR 17X24 6PK STRL BLUE (TOWEL DISPOSABLE) ×4 IMPLANT
TOWEL OR NON WOVEN STRL DISP B (DISPOSABLE) ×2 IMPLANT
TUBE CONNECTING 20X1/4 (TUBING) ×4 IMPLANT
TUBING ARTHROSCOPY IRRIG 16FT (MISCELLANEOUS) ×2 IMPLANT
UNDERPAD 30X30 INCONTINENT (UNDERPADS AND DIAPERS) ×2 IMPLANT
WAND SHORT BEVEL W/CORD (SURGICAL WAND) ×2 IMPLANT
WATER STERILE IRR 1000ML POUR (IV SOLUTION) ×2 IMPLANT

## 2012-01-02 NOTE — Transfer of Care (Signed)
Immediate Anesthesia Transfer of Care Note  Patient: Tracey Evans  Procedure(s) Performed: Procedure(s) (LRB) with comments: ANKLE ARTHROSCOPY (Right) - right ankle arthroscopy with extensive debridement , dridement and drilling talar dome osteochondral lesion  Patient Location: PACU  Anesthesia Type: GA combined with regional for post-op pain  Level of Consciousness: sedated and patient cooperative  Airway & Oxygen Therapy: Patient Spontanous Breathing and Patient connected to face mask oxygen  Post-op Assessment: Report given to PACU RN and Post -op Vital signs reviewed and stable  Post vital signs: Reviewed and stable  Complications: No apparent anesthesia complications

## 2012-01-02 NOTE — Op Note (Signed)
Tracey Evans, HUBBS NO.:  [id number]  MEDICAL RECORD NO.:  [id number]  LOCATION:                                 FACILITY:  PHYSICIAN:  Leonides Grills, M.D.     DATE OF BIRTH:  1945/12/25  DATE OF PROCEDURE:  01/02/2012 DATE OF DISCHARGE:                              OPERATIVE REPORT   POSTOPERATIVE DIAGNOSES: 1. Right anterior ankle impingement status post open reduction and     fixation, trimalleolar ankle fracture. 2. Right medial talar dome osteochondral lesion.  ANESTHESIA:  General.  SURGEON:  Leonides Grills, MD  ASSISTANT:  Richardean Canal, PA.  ESTIMATED BLOOD LOSS:  Minimal.  TOURNIQUET:  None.  COMPLICATIONS:  None.  DISPOSITION:  Stable to PR.  INDICATION:  This is a 66 year old female who has had longstanding anteromedial and medial ankle pain after an ankle fracture that was interfering with her life to the point where she cannot do what she wants to do despite conservative management.  She was consented for the above procedure.  All risks, infection, nerve or vessel injury, recurrence of the osteochondral lesion, persistent pain, worsening pain, prolonged recovery, stiffness, arthritis, wound healing problems, DVT, PE were all explained.  Questions were encouraged and answered.  OPERATION:  The patient was brought to the operating room and placed in supine position after adequate general anesthesia was administered as well as Ancef 1 g IV piggyback.  The bumps were placed on the ipsilateral hip, internally rotating right lower extremity.  All bony prominences were well padded.  The patient also received a popliteal block as well.  The right lower extremity was then prepped and draped in a sterile manner.  No tourniquet was used.  Anatomical landmarks including anterior tibialis tendon, peroneus tertius, superficial peroneal nerve carefully mapped out.  Spinal needle was then placed just medial to the anterior tibialis tendon.  Blunt tip  trocar with cannula followed by camera was then placed in the ankle.  Under direct visualization, anterolateral portal was created with spinal needle followed by nick and spread technique.  There was severe amount of scar tissue in the ankle.  Even putting the saline solution, we could not put the whole saline solution in due to the volume of the joint.  It was compressed due to the amount of scar tissue involved.  There was a large amount of synovitis and spurring as well.  An extensive debridement was performed with shaver, radiofrequency bevel, and a bur.  Once all the spurs, synovitis, and scar was removed, we were able to arrange the ankle and there was no impingement whatsoever.  We also removed scar tissue from the lateral and medial gutters as well.  There was a large osteochondral lesion in medial talar dome.  This had a cystic area to this.  This was then debrided with a shaver as well as a House curette down to normal cartilage and then this was shaved so that it was nice and smooth.  There was no impingement lesion whatsoever once we were done with the procedure.  We then stopped inflow and the lesion bled beautifully.  Pictures were obtained throughout procedure.  Range of motion at end of the  procedure was much improved.  The wound was closed with 4-0 nylon stitch.  Sterile dressing was applied.  CAM walker boot was applied.  The patient was stable to PR.     Leonides Grills, M.D.     PB/MEDQ  D:  01/02/2012  T:  01/02/2012  Job:  [id number]

## 2012-01-02 NOTE — H&P (Signed)
Tracey Evans is an 66 y.o. female.   Chief Complaint:Right ankle pain HPI: 67 year old female with on going ankle pain . Patient states ankle pain unchanged 6/10 pian at worst posterior right ankle. Denies any mechanical symptoms of th e right ankle.   Past Medical History  Diagnosis Date  . GERD (gastroesophageal reflux disease)   . Hyperlipidemia   . Hypertension   . Depression   . Fibromyalgia   . Peptic ulcer disease   . Hypothyroidism   . Low back pain   . Osteoarthritis   . Asthma   . Diverticular disease   . Complication of anesthesia     had bronc spasms during intubation surgery 2009 on foot-need albuterol inhaler or neb tx preop    Past Surgical History  Procedure Date  . Cholecystectomy   . Abdominal hysterectomy   . Tonsillectomy   . Shoulder surgery 2009,2011    rt and lt  . Colonoscopy   . Ankle fracture surgery 2009    rt     Family History  Problem Relation Age of Onset  . Dementia Mother   . Dementia Father   . Alcohol abuse Father   . Cancer Sister     breast cancer  . Alcohol abuse Brother    Social History:  reports that she has never smoked. She does not have any smokeless tobacco history on file. She reports that she does not drink alcohol or use illicit drugs.  Allergies:  Allergies  Allergen Reactions  . Adhesive (Tape)     blister  . Codeine     REACTION: nausea ,insomnia    Medications Prior to Admission  Medication Sig Dispense Refill  . albuterol (PROVENTIL HFA;VENTOLIN HFA) 108 (90 BASE) MCG/ACT inhaler Inhale 2 puffs into the lungs every 4 (four) hours as needed.      . ALPRAZolam (XANAX) 0.5 MG tablet Take 0.5 mg by mouth 2 (two) times daily at 10 AM and 5 PM.       . amLODipine-valsartan (EXFORGE) 5-160 MG per tablet Take 1 tablet by mouth daily.  30 tablet  5  . aspirin 81 MG tablet Take 81 mg by mouth daily.        Marland Kitchen atorvastatin (LIPITOR) 20 MG tablet Take 1 tablet (20 mg total) by mouth daily.  30 tablet  5  .  cyclobenzaprine (FLEXERIL) 5 MG tablet Take 5 mg by mouth 2 (two) times daily as needed.        Marland Kitchen levothyroxine (SYNTHROID, LEVOTHROID) 50 MCG tablet Take 1 tablet (50 mcg total) by mouth daily.  30 tablet  5  . meclizine (ANTIVERT) 25 MG tablet Take 25 mg by mouth 3 (three) times daily as needed.        . meloxicam (MOBIC) 15 MG tablet       . metoprolol tartrate (LOPRESSOR) 25 MG tablet TAKE 1 TABLET BY MOUTH TWICE A DAY  180 tablet  1  . ranitidine (ZANTAC) 150 MG tablet Take 150 mg by mouth 2 (two) times daily.      . traMADol (ULTRAM) 50 MG tablet Take 50 mg by mouth every 6 (six) hours as needed.      . nitroGLYCERIN (NITROSTAT) 0.4 MG SL tablet Place 0.4 mg under the tongue every 5 (five) minutes as needed. As needed for chest pain         Results for orders placed during the hospital encounter of 01/02/12 (from the past 48 hour(s))  BASIC  METABOLIC PANEL     Status: Abnormal   Collection Time   12/31/11 12:05 PM      Component Value Range Comment   Sodium 145  135 - 145 mEq/L    Potassium 4.5  3.5 - 5.1 mEq/L    Chloride 108  96 - 112 mEq/L    CO2 29  19 - 32 mEq/L    Glucose, Bld 105 (*) 70 - 99 mg/dL    BUN 6  6 - 23 mg/dL    Creatinine, Ser 9.60  0.50 - 1.10 mg/dL    Calcium 9.7  8.4 - 45.4 mg/dL    GFR calc non Af Amer 76 (*) >90 mL/min    GFR calc Af Amer 88 (*) >90 mL/min    No results found.  Review of Systems  Constitutional: Negative.   HENT: Negative.   Eyes: Negative.   Respiratory:       Asthma under control otherwise denies any respiratory symptoms.  Cardiovascular: Negative.   Gastrointestinal: Positive for heartburn.  Musculoskeletal: Negative for falls.       Increased joint pain secondary to being off naproxen. Right posterior ankle pain described as stinging.   Skin: Negative.   Endo/Heme/Allergies:       Denies history or family history of DVT    Blood pressure 129/85, pulse 71, temperature 98.9 F (37.2 C), temperature source Oral, resp. rate 20,  height 5\' 3"  (1.6 m), weight 96.888 kg (213 lb 9.6 oz), SpO2 96.00%. Physical Exam  Constitutional: She is oriented to person, place, and time. She appears well-developed and well-nourished.  Eyes: EOM are normal.  Cardiovascular: Normal rate and intact distal pulses.   Respiratory: Effort normal.  Musculoskeletal:       GROM of right ankle Non tender of posterior tibial tendon .Slight tenderness over the anterior aspect of the right ankle.  Neurological: She is alert and oriented to person, place, and time.  Skin: Skin is warm and dry.  Psychiatric: She has a normal mood and affect.     Assessment/Plan Right anterior ankle impingement , right medial talar dome osteochondral lesion. Gastric reflux under control with Zantac  Plan: Right ankle arthroscopy with extensive debridement and extensive debridement drilling drilling talar dome osteochondral lesion. Will place patient on enteric coated aspirin 325 mg twice daily while non weight-bearing.   Tracey Evans 01/02/2012, 8:11 AM

## 2012-01-02 NOTE — Anesthesia Postprocedure Evaluation (Signed)
  Anesthesia Post-op Note  Patient: Tracey Evans  Procedure(s) Performed: Procedure(s) (LRB) with comments: ANKLE ARTHROSCOPY (Right) - right ankle arthroscopy with extensive debridement , dridement and drilling talar dome osteochondral lesion  Patient Location: PACU  Anesthesia Type: GA combined with regional for post-op pain  Level of Consciousness: awake  Airway and Oxygen Therapy: Patient Spontanous Breathing  Post-op Pain: mild  Post-op Assessment: Post-op Vital signs reviewed  Post-op Vital Signs: stable  Complications: No apparent anesthesia complications

## 2012-01-02 NOTE — Anesthesia Preprocedure Evaluation (Signed)
Anesthesia Evaluation  Patient identified by MRN, date of birth, ID band Patient awake    Reviewed: Allergy & Precautions  Airway Mallampati: II  Neck ROM: Full    Dental  (+) Teeth Intact   Pulmonary asthma ,  breath sounds clear to auscultation        Cardiovascular hypertension, Rhythm:Regular Rate:Normal     Neuro/Psych Depression  Neuromuscular disease    GI/Hepatic PUD, GERD-  ,  Endo/Other  Hypothyroidism   Renal/GU      Musculoskeletal  (+) Fibromyalgia -  Abdominal (+) + obese,   Peds  Hematology   Anesthesia Other Findings   Reproductive/Obstetrics                           Anesthesia Physical Anesthesia Plan  ASA: III  Anesthesia Plan: General   Post-op Pain Management:    Induction: Intravenous  Airway Management Planned: LMA  Additional Equipment:   Intra-op Plan:   Post-operative Plan: Extubation in OR  Informed Consent:   Dental advisory given  Plan Discussed with: CRNA and Surgeon  Anesthesia Plan Comments:         Anesthesia Quick Evaluation

## 2012-01-02 NOTE — Progress Notes (Signed)
Assisted Dr. Massagee with right, ultrasound guided, popliteal/saphenous block. Side rails up, monitors on throughout procedure. See vital signs in flow sheet. Tolerated Procedure well. 

## 2012-01-02 NOTE — Anesthesia Procedure Notes (Addendum)
Anesthesia Regional Block:  Popliteal block  Pre-Anesthetic Checklist: ,, timeout performed, Correct Patient, Correct Site, Correct Laterality, Correct Procedure, Correct Position, site marked, Risks and benefits discussed, pre-op evaluation,  At surgeon's request and post-op pain management  Laterality: Lower and Right  Prep: chloraprep       Needles:  Injection technique: Single-shot  Needle Type: Echogenic Needle      Needle Gauge: 22 and 22 G  Needle insertion depth: 6 cm   Additional Needles:  Procedures: ultrasound guided and nerve stimulator Popliteal block  Nerve Stimulator or Paresthesia:  Response: Twitch elicited, 0.8 mA, 0.3 ms, 6 cm  Additional Responses:   Narrative:  Start time: 01/02/2012 8:05 AM End time: 01/02/2012 8:23 AM Injection made incrementally with aspirations every 5 mL.  Performed by: Personally  Anesthesiologist: Oren Section, MD  Additional Notes: This is a lateral approach to the popliteal fossa area. A stimulator needle is used starting at 1.5 mAmp current and descending as nerve contact is made. Injection of anesthetic is in 5 ml increments with multiple neg aspirations before continuing. Total volume is 40 cc.    Popliteal block Procedure Name: LMA Insertion Date/Time: 01/02/2012 8:46 AM Performed by: Gar Gibbon Pre-anesthesia Checklist: Patient identified, Emergency Drugs available, Suction available and Patient being monitored Patient Re-evaluated:Patient Re-evaluated prior to inductionOxygen Delivery Method: Circle System Utilized Preoxygenation: Pre-oxygenation with 100% oxygen Intubation Type: IV induction Ventilation: Mask ventilation without difficulty LMA: LMA with gastric port inserted LMA Size: 4.0 Number of attempts: 1 Placement Confirmation: positive ETCO2 Tube secured with: Tape Dental Injury: Teeth and Oropharynx as per pre-operative assessment

## 2012-01-02 NOTE — H&P (Signed)
  H&P documentation: Placed to be scanned history and physical exam in chart.  -History and Physical Reviewed  -Patient has been re-examined  -No change in the plan of care  Dsean Vantol A  

## 2012-01-02 NOTE — Brief Op Note (Signed)
01/02/2012  10:29 AM  PATIENT:  [name]  66 y.o. female  PRE-OPERATIVE DIAGNOSIS:  right ankle impengement(status post orif trimalleolar ) ( medial talar dome ocl)  POST-OPERATIVE DIAGNOSIS:  right ankle impengement(status post orif trimalleolar ) ( medial talar dome ocl)  PROCEDURE:  Procedure(s) (LRB) with comments: ANKLE ARTHROSCOPY (Right) - right ankle arthroscopy with extensive debridement , dridement and drilling talar dome osteochondral lesion  SURGEON:  Surgeon(s) and Role:    * Sherri Rad, MD - Primary  PHYSICIAN ASSISTANT: Rexene Edison, PAC    ASSISTANTS: Rexene Edison, [facility]    ANESTHESIA:   general  EBL:  Total I/O In: 500 [I.V.:500] Out: -   BLOOD ADMINISTERED:none  DRAINS: none   LOCAL MEDICATIONS USED:  NONE  SPECIMEN:  No Specimen  DISPOSITION OF SPECIMEN:  N/A  COUNTS:  YES  TOURNIQUET:  * Missing tourniquet times found for documented tourniquets in log:  16109 *  DICTATION: .Other Dictation: Dictation Number [id number]  PLAN OF CARE: Discharge to home after PACU  PATIENT DISPOSITION:  PACU - hemodynamically stable.   Delay start of Pharmacological VTE agent (>24hrs) due to surgical blood loss or risk of bleeding: no

## 2012-01-03 ENCOUNTER — Encounter (HOSPITAL_BASED_OUTPATIENT_CLINIC_OR_DEPARTMENT_OTHER): Payer: Self-pay | Admitting: Orthopedic Surgery

## 2012-01-12 DIAGNOSIS — M25579 Pain in unspecified ankle and joints of unspecified foot: Secondary | ICD-10-CM | POA: Diagnosis not present

## 2012-01-12 DIAGNOSIS — F329 Major depressive disorder, single episode, unspecified: Secondary | ICD-10-CM | POA: Diagnosis not present

## 2012-01-12 DIAGNOSIS — M6281 Muscle weakness (generalized): Secondary | ICD-10-CM | POA: Diagnosis not present

## 2012-01-12 DIAGNOSIS — IMO0001 Reserved for inherently not codable concepts without codable children: Secondary | ICD-10-CM | POA: Diagnosis not present

## 2012-01-12 DIAGNOSIS — Z4789 Encounter for other orthopedic aftercare: Secondary | ICD-10-CM | POA: Diagnosis not present

## 2012-01-12 DIAGNOSIS — R269 Unspecified abnormalities of gait and mobility: Secondary | ICD-10-CM | POA: Diagnosis not present

## 2012-01-14 DIAGNOSIS — R269 Unspecified abnormalities of gait and mobility: Secondary | ICD-10-CM | POA: Diagnosis not present

## 2012-01-14 DIAGNOSIS — IMO0001 Reserved for inherently not codable concepts without codable children: Secondary | ICD-10-CM | POA: Diagnosis not present

## 2012-01-14 DIAGNOSIS — M6281 Muscle weakness (generalized): Secondary | ICD-10-CM | POA: Diagnosis not present

## 2012-01-14 DIAGNOSIS — Z4789 Encounter for other orthopedic aftercare: Secondary | ICD-10-CM | POA: Diagnosis not present

## 2012-01-14 DIAGNOSIS — F329 Major depressive disorder, single episode, unspecified: Secondary | ICD-10-CM | POA: Diagnosis not present

## 2012-01-15 DIAGNOSIS — IMO0001 Reserved for inherently not codable concepts without codable children: Secondary | ICD-10-CM | POA: Diagnosis not present

## 2012-01-15 DIAGNOSIS — Z4789 Encounter for other orthopedic aftercare: Secondary | ICD-10-CM | POA: Diagnosis not present

## 2012-01-15 DIAGNOSIS — F329 Major depressive disorder, single episode, unspecified: Secondary | ICD-10-CM | POA: Diagnosis not present

## 2012-01-15 DIAGNOSIS — M6281 Muscle weakness (generalized): Secondary | ICD-10-CM | POA: Diagnosis not present

## 2012-01-15 DIAGNOSIS — R269 Unspecified abnormalities of gait and mobility: Secondary | ICD-10-CM | POA: Diagnosis not present

## 2012-01-17 DIAGNOSIS — Z4789 Encounter for other orthopedic aftercare: Secondary | ICD-10-CM | POA: Diagnosis not present

## 2012-01-17 DIAGNOSIS — R269 Unspecified abnormalities of gait and mobility: Secondary | ICD-10-CM | POA: Diagnosis not present

## 2012-01-17 DIAGNOSIS — IMO0001 Reserved for inherently not codable concepts without codable children: Secondary | ICD-10-CM | POA: Diagnosis not present

## 2012-01-17 DIAGNOSIS — F329 Major depressive disorder, single episode, unspecified: Secondary | ICD-10-CM | POA: Diagnosis not present

## 2012-01-17 DIAGNOSIS — M6281 Muscle weakness (generalized): Secondary | ICD-10-CM | POA: Diagnosis not present

## 2012-01-21 ENCOUNTER — Observation Stay (HOSPITAL_COMMUNITY)
Admission: EM | Admit: 2012-01-21 | Discharge: 2012-01-23 | Disposition: A | Discharge status: Home / Self Care | Payer: Medicare Other | Attending: Internal Medicine | Admitting: Internal Medicine

## 2012-01-21 ENCOUNTER — Encounter (HOSPITAL_COMMUNITY): Payer: Self-pay | Admitting: Emergency Medicine

## 2012-01-21 ENCOUNTER — Telehealth: Payer: Self-pay | Admitting: Family Medicine

## 2012-01-21 ENCOUNTER — Emergency Department (HOSPITAL_COMMUNITY): Payer: Medicare Other

## 2012-01-21 DIAGNOSIS — M545 Low back pain, unspecified: Secondary | ICD-10-CM

## 2012-01-21 DIAGNOSIS — E785 Hyperlipidemia, unspecified: Secondary | ICD-10-CM

## 2012-01-21 DIAGNOSIS — E669 Obesity, unspecified: Secondary | ICD-10-CM

## 2012-01-21 DIAGNOSIS — R42 Dizziness and giddiness: Secondary | ICD-10-CM

## 2012-01-21 DIAGNOSIS — Z8711 Personal history of peptic ulcer disease: Secondary | ICD-10-CM | POA: Insufficient documentation

## 2012-01-21 DIAGNOSIS — R269 Unspecified abnormalities of gait and mobility: Secondary | ICD-10-CM | POA: Diagnosis not present

## 2012-01-21 DIAGNOSIS — IMO0001 Reserved for inherently not codable concepts without codable children: Secondary | ICD-10-CM | POA: Insufficient documentation

## 2012-01-21 DIAGNOSIS — Z4789 Encounter for other orthopedic aftercare: Secondary | ICD-10-CM | POA: Diagnosis not present

## 2012-01-21 DIAGNOSIS — J45909 Unspecified asthma, uncomplicated: Secondary | ICD-10-CM

## 2012-01-21 DIAGNOSIS — E039 Hypothyroidism, unspecified: Secondary | ICD-10-CM | POA: Diagnosis present

## 2012-01-21 DIAGNOSIS — K219 Gastro-esophageal reflux disease without esophagitis: Secondary | ICD-10-CM

## 2012-01-21 DIAGNOSIS — I10 Essential (primary) hypertension: Secondary | ICD-10-CM | POA: Diagnosis present

## 2012-01-21 DIAGNOSIS — K529 Noninfective gastroenteritis and colitis, unspecified: Secondary | ICD-10-CM | POA: Diagnosis present

## 2012-01-21 DIAGNOSIS — K5730 Diverticulosis of large intestine without perforation or abscess without bleeding: Secondary | ICD-10-CM | POA: Diagnosis not present

## 2012-01-21 DIAGNOSIS — R1032 Left lower quadrant pain: Secondary | ICD-10-CM | POA: Diagnosis not present

## 2012-01-21 DIAGNOSIS — F329 Major depressive disorder, single episode, unspecified: Secondary | ICD-10-CM | POA: Insufficient documentation

## 2012-01-21 DIAGNOSIS — M1990 Unspecified osteoarthritis, unspecified site: Secondary | ICD-10-CM | POA: Diagnosis not present

## 2012-01-21 DIAGNOSIS — E1169 Type 2 diabetes mellitus with other specified complication: Secondary | ICD-10-CM | POA: Diagnosis present

## 2012-01-21 DIAGNOSIS — F3289 Other specified depressive episodes: Secondary | ICD-10-CM

## 2012-01-21 DIAGNOSIS — K279 Peptic ulcer, site unspecified, unspecified as acute or chronic, without hemorrhage or perforation: Secondary | ICD-10-CM

## 2012-01-21 DIAGNOSIS — Z23 Encounter for immunization: Secondary | ICD-10-CM | POA: Diagnosis not present

## 2012-01-21 DIAGNOSIS — Z0000 Encounter for general adult medical examination without abnormal findings: Secondary | ICD-10-CM

## 2012-01-21 DIAGNOSIS — R1110 Vomiting, unspecified: Secondary | ICD-10-CM | POA: Diagnosis not present

## 2012-01-21 DIAGNOSIS — K5289 Other specified noninfective gastroenteritis and colitis: Secondary | ICD-10-CM | POA: Diagnosis not present

## 2012-01-21 DIAGNOSIS — K5732 Diverticulitis of large intestine without perforation or abscess without bleeding: Secondary | ICD-10-CM | POA: Diagnosis not present

## 2012-01-21 DIAGNOSIS — J019 Acute sinusitis, unspecified: Secondary | ICD-10-CM

## 2012-01-21 DIAGNOSIS — J3081 Allergic rhinitis due to animal (cat) (dog) hair and dander: Secondary | ICD-10-CM

## 2012-01-21 DIAGNOSIS — M2550 Pain in unspecified joint: Secondary | ICD-10-CM

## 2012-01-21 DIAGNOSIS — K5792 Diverticulitis of intestine, part unspecified, without perforation or abscess without bleeding: Secondary | ICD-10-CM

## 2012-01-21 DIAGNOSIS — M6281 Muscle weakness (generalized): Secondary | ICD-10-CM | POA: Diagnosis not present

## 2012-01-21 DIAGNOSIS — N39498 Other specified urinary incontinence: Secondary | ICD-10-CM

## 2012-01-21 LAB — CBC WITH DIFFERENTIAL/PLATELET
Basophils Relative: 0 % (ref 0–1)
HCT: 38.3 % (ref 36.0–46.0)
Hemoglobin: 12.8 g/dL (ref 12.0–15.0)
Lymphocytes Relative: 33 % (ref 12–46)
Lymphs Abs: 3.8 10*3/uL (ref 0.7–4.0)
MCHC: 33.4 g/dL (ref 30.0–36.0)
Monocytes Absolute: 0.8 10*3/uL (ref 0.1–1.0)
Monocytes Relative: 7 % (ref 3–12)
Neutro Abs: 6.8 10*3/uL (ref 1.7–7.7)

## 2012-01-21 LAB — BASIC METABOLIC PANEL
BUN: 12 mg/dL (ref 6–23)
CO2: 27 mEq/L (ref 19–32)
Chloride: 105 mEq/L (ref 96–112)
Creatinine, Ser: 0.77 mg/dL (ref 0.50–1.10)
Glucose, Bld: 111 mg/dL — ABNORMAL HIGH (ref 70–99)

## 2012-01-21 LAB — URINALYSIS, ROUTINE W REFLEX MICROSCOPIC
Bilirubin Urine: NEGATIVE
Glucose, UA: NEGATIVE mg/dL
Hgb urine dipstick: NEGATIVE
Ketones, ur: NEGATIVE mg/dL
pH: 5 (ref 5.0–8.0)

## 2012-01-21 MED ORDER — AMLODIPINE BESYLATE-VALSARTAN 5-160 MG PO TABS: 1.0000 | ORAL_TABLET | Freq: Every day | ORAL | Status: DC

## 2012-01-21 MED ORDER — ONDANSETRON HCL 4 MG PO TABS: 4.0000 mg | ORAL_TABLET | Freq: Four times a day (QID) | ORAL | Status: DC | PRN

## 2012-01-21 MED ORDER — ONDANSETRON HCL 4 MG/2ML IJ SOLN
4.0000 mg | Freq: Once | INTRAMUSCULAR | Status: AC
  Administered 2012-01-21: 4 mg via INTRAVENOUS
  Filled 2012-01-21 (×2): qty 2

## 2012-01-21 MED ORDER — IOHEXOL 300 MG/ML  SOLN: 20.0000 mL | INTRAMUSCULAR | Status: DC

## 2012-01-21 MED ORDER — METRONIDAZOLE IN NACL 5-0.79 MG/ML-% IV SOLN
500.0000 mg | Freq: Once | INTRAVENOUS | Status: AC
  Administered 2012-01-22: 500 mg via INTRAVENOUS
  Filled 2012-01-21: qty 100

## 2012-01-21 MED ORDER — MORPHINE SULFATE 4 MG/ML IJ SOLN
4.0000 mg | Freq: Once | INTRAMUSCULAR | Status: AC
  Administered 2012-01-21: 4 mg via INTRAVENOUS
  Filled 2012-01-21 (×2): qty 1

## 2012-01-21 MED ORDER — ONDANSETRON HCL 4 MG/2ML IJ SOLN: 4.0000 mg | Freq: Four times a day (QID) | INTRAMUSCULAR | Status: DC | PRN

## 2012-01-21 MED ORDER — MECLIZINE HCL 25 MG PO TABS
25.0000 mg | ORAL_TABLET | Freq: Three times a day (TID) | ORAL | Status: DC | PRN
  Filled 2012-01-21: qty 1

## 2012-01-21 MED ORDER — ATORVASTATIN CALCIUM 20 MG PO TABS
20.0000 mg | ORAL_TABLET | Freq: Every day | ORAL | Status: DC
  Administered 2012-01-22 – 2012-01-23 (×2): 20 mg via ORAL
  Filled 2012-01-21 (×2): qty 1

## 2012-01-21 MED ORDER — ALBUTEROL SULFATE HFA 108 (90 BASE) MCG/ACT IN AERS: 2.0000 | INHALATION_SPRAY | RESPIRATORY_TRACT | Status: DC | PRN

## 2012-01-21 MED ORDER — IRBESARTAN 150 MG PO TABS
150.0000 mg | ORAL_TABLET | Freq: Every day | ORAL | Status: DC
  Administered 2012-01-22 – 2012-01-23 (×2): 150 mg via ORAL
  Filled 2012-01-21 (×2): qty 1

## 2012-01-21 MED ORDER — IOHEXOL 300 MG/ML  SOLN
100.0000 mL | Freq: Once | INTRAMUSCULAR | Status: AC | PRN
  Administered 2012-01-21: 100 mL via INTRAVENOUS

## 2012-01-21 MED ORDER — ALPRAZOLAM 0.5 MG PO TABS
0.5000 mg | ORAL_TABLET | Freq: Two times a day (BID) | ORAL | Status: DC
  Administered 2012-01-22 – 2012-01-23 (×3): 0.5 mg via ORAL
  Filled 2012-01-21 (×3): qty 1

## 2012-01-21 MED ORDER — FAMOTIDINE 20 MG PO TABS
20.0000 mg | ORAL_TABLET | Freq: Every day | ORAL | Status: DC
  Administered 2012-01-22 – 2012-01-23 (×2): 20 mg via ORAL
  Filled 2012-01-21 (×2): qty 1

## 2012-01-21 MED ORDER — MORPHINE SULFATE 2 MG/ML IJ SOLN: 1.0000 mg | INTRAMUSCULAR | Status: DC | PRN

## 2012-01-21 MED ORDER — LEVOTHYROXINE SODIUM 50 MCG PO TABS
50.0000 ug | ORAL_TABLET | Freq: Every day | ORAL | Status: DC
  Administered 2012-01-22 – 2012-01-23 (×2): 50 ug via ORAL
  Filled 2012-01-21 (×3): qty 1

## 2012-01-21 MED ORDER — CYCLOBENZAPRINE HCL 10 MG PO TABS
5.0000 mg | ORAL_TABLET | Freq: Two times a day (BID) | ORAL | Status: DC | PRN
  Administered 2012-01-22 – 2012-01-23 (×2): 5 mg via ORAL
  Filled 2012-01-21 (×2): qty 1

## 2012-01-21 MED ORDER — SODIUM CHLORIDE 0.9 % IV BOLUS (SEPSIS)
1000.0000 mL | Freq: Once | INTRAVENOUS | Status: AC
  Administered 2012-01-21: 1000 mL via INTRAVENOUS

## 2012-01-21 MED ORDER — SODIUM CHLORIDE 0.9 % IV SOLN
INTRAVENOUS | Status: AC
  Administered 2012-01-22 (×3): via INTRAVENOUS

## 2012-01-21 MED ORDER — ACETAMINOPHEN 325 MG PO TABS
650.0000 mg | ORAL_TABLET | Freq: Four times a day (QID) | ORAL | Status: DC | PRN
  Administered 2012-01-22: 650 mg via ORAL
  Filled 2012-01-21: qty 2

## 2012-01-21 MED ORDER — CIPROFLOXACIN IN D5W 400 MG/200ML IV SOLN
400.0000 mg | Freq: Once | INTRAVENOUS | Status: AC
  Administered 2012-01-21: 400 mg via INTRAVENOUS
  Filled 2012-01-21: qty 200

## 2012-01-21 MED ORDER — AMLODIPINE BESYLATE 5 MG PO TABS
5.0000 mg | ORAL_TABLET | Freq: Every day | ORAL | Status: DC
  Administered 2012-01-22 – 2012-01-23 (×2): 5 mg via ORAL
  Filled 2012-01-21 (×2): qty 1

## 2012-01-21 MED ORDER — ACETAMINOPHEN 650 MG RE SUPP: 650.0000 mg | Freq: Four times a day (QID) | RECTAL | Status: DC | PRN

## 2012-01-21 MED ORDER — METRONIDAZOLE IN NACL 5-0.79 MG/ML-% IV SOLN
500.0000 mg | Freq: Three times a day (TID) | INTRAVENOUS | Status: DC
  Administered 2012-01-22 – 2012-01-23 (×4): 500 mg via INTRAVENOUS
  Filled 2012-01-21 (×6): qty 100

## 2012-01-21 MED ORDER — ASPIRIN EC 325 MG PO TBEC
325.0000 mg | DELAYED_RELEASE_TABLET | Freq: Two times a day (BID) | ORAL | Status: DC
  Administered 2012-01-22 – 2012-01-23 (×4): 325 mg via ORAL
  Filled 2012-01-21 (×5): qty 1

## 2012-01-21 MED ORDER — METOPROLOL TARTRATE 25 MG PO TABS
25.0000 mg | ORAL_TABLET | Freq: Two times a day (BID) | ORAL | Status: DC
  Administered 2012-01-22 – 2012-01-23 (×4): 25 mg via ORAL
  Filled 2012-01-21 (×5): qty 1

## 2012-01-21 NOTE — Telephone Encounter (Signed)
Left message on voicemail for patient's daughter to return call, reason for call (not left on voicemail): verify patient went to ER

## 2012-01-21 NOTE — ED Notes (Signed)
Pt c/o lower abd pain with N/V/D with some blood started yesterday

## 2012-01-21 NOTE — ED Provider Notes (Signed)
[name] is a 66 y.o. female with worsening abdominal pain, and cramping, since yesterday. Initially, her stool is loose and brown, but then became bloody. She denies fever. On exam, the abdomen is mildly tender in the left lower quadrant. There is no rebound, tenderness or guarding.  Evaluation will proceed with CT scan to evaluate for diverticulitis and abscess.  Medical screening examination/treatment/procedure(s) were conducted as a shared visit with non-physician practitioner(s) and myself.  I personally evaluated the patient during the encounter  Flint Melter, MD 01/24/12 [id number]

## 2012-01-21 NOTE — ED Notes (Signed)
PT STATES SHE ATE DINNER AROUND 6 PM YESTERDAY. AROUND 2030 SHE BEGAN HAVING ABDOMINAL CRAMPING. STATES AROUND 2200 SHE BEGAN HAVING DIARRHEA AND VOMITING. STATES THIS LASTED UNTIL AROUND 1 AM.  STATES AWOKE THIS MORNING AND HAD 3 EPISODES OF DIARRHEA . EACH TIME SHE HAD BRIGHT RED BLOOD ON THE TISSUE WHEN SHE WIPED. THIS ENDED AROUND 11 AM. STATES SINCE THAT TIME SHE HAS HAD ONLY GENERALIZED ABDOMINAL CRAMPING. DENIES FEVER OTHER SX.

## 2012-01-21 NOTE — ED Provider Notes (Signed)
History     CSN: [id number]  Arrival date & time 01/21/12  1359   First MD Initiated Contact with Patient 01/21/12 1703      Chief Complaint  Patient presents with  . Abdominal Pain  . Diarrhea  . Emesis    (Consider location/radiation/quality/duration/timing/severity/associated sxs/prior treatment) HPI Comments: Patient with hx diverticulitis reports abdominal pain, vomiting, and diarrhea that began last night around 10pm, two hours after eating [place] food.  States that the abdominal cramping began as generalized pain, continued overnight and settled into her LLQ.  This morning she had an episode of bloody diarrhea but since has had no bowel movements.  Emesis was described as contents of her stomach.  Daughter ate the exact same thing from the restaurant but has had no symptoms.  Denies fevers.  States this does feel like her prior diverticulitis.   The history is provided by the patient.    Past Medical History  Diagnosis Date  . GERD (gastroesophageal reflux disease)   . Hyperlipidemia   . Hypertension   . Depression   . Fibromyalgia   . Peptic ulcer disease   . Hypothyroidism   . Low back pain   . Osteoarthritis   . Asthma   . Diverticular disease   . Complication of anesthesia     had bronc spasms during intubation surgery 2009 on foot-need albuterol inhaler or neb tx preop    Past Surgical History  Procedure Date  . Cholecystectomy   . Abdominal hysterectomy   . Tonsillectomy   . Shoulder surgery 2009,2011    rt and lt  . Colonoscopy   . Ankle fracture surgery 2009    rt   . Ankle arthroscopy 01/02/2012    Procedure: ANKLE ARTHROSCOPY;  Surgeon: Sherri Rad, MD;  Location:  SURGERY CENTER;  Service: Orthopedics;  Laterality: Right;  right ankle arthroscopy with extensive debridement , dridement and drilling talar dome osteochondral lesion    Family History  Problem Relation Age of Onset  . Dementia Mother   . Dementia Father   . Alcohol  abuse Father   . Cancer Sister     breast cancer  . Alcohol abuse Brother     History  Substance Use Topics  . Smoking status: Never Smoker   . Smokeless tobacco: Not on file  . Alcohol Use: No    OB History    Grav Para Term Preterm Abortions TAB SAB Ect Mult Living                  Review of Systems  Constitutional: Negative for fever.  Gastrointestinal: Positive for nausea, vomiting, abdominal pain, diarrhea and blood in stool.  All other systems reviewed and are negative.    Allergies  Adhesive and Codeine  Home Medications   Current Outpatient Rx  Name Route Sig Dispense Refill  . ALBUTEROL SULFATE HFA 108 (90 BASE) MCG/ACT IN AERS Inhalation Inhale 2 puffs into the lungs every 4 (four) hours as needed.    . ALPRAZOLAM 0.5 MG PO TABS Oral Take 0.5 mg by mouth 2 (two) times daily at 10 AM and 5 PM.     . AMLODIPINE BESYLATE-VALSARTAN 5-160 MG PO TABS Oral Take 1 tablet by mouth daily. 30 tablet 5  . ASPIRIN EC 325 MG PO TBEC Oral Take 1 tablet (325 mg total) by mouth 2 (two) times daily. 90 tablet 0    Remain on aspirin while non weight bearing  . ATORVASTATIN CALCIUM 20 MG  PO TABS Oral Take 1 tablet (20 mg total) by mouth daily. 30 tablet 5  . CYCLOBENZAPRINE HCL 5 MG PO TABS Oral Take 5 mg by mouth 2 (two) times daily as needed.      Marland Kitchen HYDROMORPHONE HCL 2 MG PO TABS Oral Take 1 tablet (2 mg total) by mouth every 6 (six) hours as needed for pain. 30 tablet 0  . LEVOTHYROXINE SODIUM 50 MCG PO TABS Oral Take 1 tablet (50 mcg total) by mouth daily. 30 tablet 5  . MECLIZINE HCL 25 MG PO TABS Oral Take 25 mg by mouth 3 (three) times daily as needed.      . MELOXICAM 15 MG PO TABS      . METHOCARBAMOL 500 MG PO TABS Oral Take 1 tablet (500 mg total) by mouth 3 (three) times daily. 40 tablet 1  . METOPROLOL TARTRATE 25 MG PO TABS  TAKE 1 TABLET BY MOUTH TWICE A DAY 180 tablet 1  . NITROGLYCERIN 0.4 MG SL SUBL Sublingual Place 0.4 mg under the tongue every 5 (five) minutes  as needed. As needed for chest pain     . RANITIDINE HCL 150 MG PO TABS Oral Take 150 mg by mouth 2 (two) times daily.    Marland Kitchen VITAMIN C 500 MG PO TABS Oral Take 1 tablet (500 mg total) by mouth daily. 90 tablet 0    BP 132/89  Pulse 92  Temp 98.7 F (37.1 C) (Oral)  Resp 18  SpO2 95%  Physical Exam  Nursing note and vitals reviewed. Constitutional: She appears well-developed and well-nourished. No distress.  HENT:  Head: Normocephalic and atraumatic.  Neck: Neck supple.  Cardiovascular: Normal rate and regular rhythm.   Pulmonary/Chest: Effort normal and breath sounds normal. No respiratory distress. She has no wheezes. She has no rales.  Abdominal: Soft. Bowel sounds are normal. She exhibits no distension and no mass. There is tenderness in the left lower quadrant. There is no rebound and no guarding.  Neurological: She is alert.  Skin: She is not diaphoretic.    ED Course  Procedures (including critical care time)  Labs Reviewed  CBC WITH DIFFERENTIAL - Abnormal; Notable for the following:    WBC 11.6 (*)     All other components within normal limits  BASIC METABOLIC PANEL - Abnormal; Notable for the following:    Glucose, Bld 111 (*)     GFR calc non Af Amer 86 (*)     All other components within normal limits  URINALYSIS, ROUTINE W REFLEX MICROSCOPIC - Abnormal; Notable for the following:    Color, Urine STRAW (*)     Specific Gravity, Urine <1.005 (*)     All other components within normal limits   Ct Abdomen Pelvis W Contrast  01/21/2012  *RADIOLOGY REPORT*  Clinical Data: Left lower quadrant pain, vomiting, bloody diarrhea, cramping, history diverticulitis, hypertension  CT ABDOMEN AND PELVIS WITH CONTRAST  Technique:  Multidetector CT imaging of the abdomen and pelvis was performed following the standard protocol during bolus administration of intravenous contrast. Sagittal and coronal MPR images reconstructed from axial data set.  Contrast: OMNIPAQUE IOHEXOL  300 MG/ML  SOLN Dilute oral contrast.  Comparison: None  Findings: Lung bases clear. 9 mm indeterminate lesion at inferior pole right kidney series 5 image 25. 8 mm indeterminate lesion at upper pole left kidney series 2 image 25. Gallbladder surgically absent. Liver, spleen, pancreas, kidneys, and adrenal glands otherwise unremarkable. Diffuse diverticulosis of the colon with wall  thickening of the colon beginning at the distal descending colon extending to the midsigmoid. Mild infiltrative changes of pericolic fat. Colonic wall thickening extends over approximately 18 cm length. This could represent a long segment of diverticulitis or colitis. No evidence of perforation or abscess. Decompressed bladder. Uterus surgically absent with normal sized ovaries and normal- appearing appendix. Stomach and bowel loops otherwise normal appearance. No mass, adenopathy, free fluid, free air or hernia. No acute osseous findings.  IMPRESSION: Long segment of colonic wall thickening extending from the distal descending colon through the mid sigmoid colon over 18 cm length, the associated pericolic inflammation. Scattered colonic diverticulosis. With the length of colonic involvement, this could represent a very long segment of diverticulitis or a segmental colitis of the descending/sigmoid colon. No evidence of perforation or abscess.   Original Report Authenticated By: Lollie Marrow, M.D.     5:49 PM Discussed patient with Dr Effie Shy. Also discussed with Felicie Morn, NP, who will assume care of patient in CDU pending CT abd/pelvis.    1. Diverticulitis     MDM  Pt with N/V/D, bloody diarrhea, LLQ pain.  Pt moved to CDU pending CT, concern for diverticulitis.  Further care and disposition per Felicie Morn, NP.          [place], [place] 01/21/12 2220

## 2012-01-21 NOTE — ED Notes (Signed)
Patient transported to CT 

## 2012-01-21 NOTE — Telephone Encounter (Signed)
Caller: Angelynn/Patient; Patient Name: Tracey Evans; PCP: Sheliah Hatch.; Best Callback Phone Number: [phone]. Calling re: 10/13 onset of abdominal cramping and diarrhea. 10/14 has severe stomach cramps and is passing bright red from her rectum with the stool and has had constant abdominal pain and cramping. Has a history of Diverticulosis. Care advice per Gastrointestinal Bleeding. Will proceed to [facility] ED for eval.

## 2012-01-21 NOTE — Telephone Encounter (Signed)
Pt in ER

## 2012-01-21 NOTE — H&P (Signed)
Tracey Evans is an 66 y.o. female.   Patient was seen and examined on January 21, 2012. PCP - Dr. Joesph Fillers. Chief Complaint: Abdominal pain or diarrhea. HPI: 66 year-old female with history of hypertension and hypothyroidism presented with complaints of having multiple episodes of diarrhea with abdominal discomfort. Her symptoms started off from yesterday. She has had multiple episodes of diarrhea which eventually had become bloody. Patient also has been experiencing diffuse colicky abdominal pain. The pain is not related to her eating or diarrhea. Patient has had previous episodes of diverticulitis. She has had a colonoscopy a year and a half. As per patient the colonoscopy was showing only diverticulosis with no polyps. At this time CT abdomen and pelvis was done which shows sigmoid colitis. Patient has been admitted for further management.  Past Medical History  Diagnosis Date  . GERD (gastroesophageal reflux disease)   . Hyperlipidemia   . Hypertension   . Depression   . Fibromyalgia   . Peptic ulcer disease   . Hypothyroidism   . Low back pain   . Osteoarthritis   . Asthma   . Diverticular disease   . Complication of anesthesia     had bronc spasms during intubation surgery 2009 on foot-need albuterol inhaler or neb tx preop    Past Surgical History  Procedure Date  . Cholecystectomy   . Abdominal hysterectomy   . Tonsillectomy   . Shoulder surgery 2009,2011    rt and lt  . Colonoscopy   . Ankle fracture surgery 2009    rt   . Ankle arthroscopy 01/02/2012    Procedure: ANKLE ARTHROSCOPY;  Surgeon: Sherri Rad, MD;  Location: [facility];  Service: Orthopedics;  Laterality: Right;  right ankle arthroscopy with extensive debridement , dridement and drilling talar dome osteochondral lesion    Family History  Problem Relation Age of Onset  . Dementia Mother   . Dementia Father   . Alcohol abuse Father   . Cancer Sister     breast cancer  .  Alcohol abuse Brother    Social History:  reports that she has never smoked. She does not have any smokeless tobacco history on file. She reports that she does not drink alcohol or use illicit drugs.  Allergies:  Allergies  Allergen Reactions  . Dilaudid (Hydromorphone Hcl) Anaphylaxis  . Adhesive (Tape)     blister  . Codeine     REACTION: nausea ,insomnia     (Not in a hospital admission)  Results for orders placed during the hospital encounter of 01/21/12 (from the past 48 hour(s))  CBC WITH DIFFERENTIAL     Status: Abnormal   Collection Time   01/21/12  2:16 PM      Component Value Range Comment   WBC 11.6 (*) 4.0 - 10.5 K/uL    RBC 4.26  3.87 - 5.11 MIL/uL    Hemoglobin 12.8  12.0 - 15.0 g/dL    HCT 16.1  09.6 - 04.5 %    MCV 89.9  78.0 - 100.0 fL    MCH 30.0  26.0 - 34.0 pg    MCHC 33.4  30.0 - 36.0 g/dL    RDW 40.9  81.1 - 91.4 %    Platelets 353  150 - 400 K/uL    Neutrophils Relative 58  43 - 77 %    Neutro Abs 6.8  1.7 - 7.7 K/uL    Lymphocytes Relative 33  12 - 46 %  Lymphs Abs 3.8  0.7 - 4.0 K/uL    Monocytes Relative 7  3 - 12 %    Monocytes Absolute 0.8  0.1 - 1.0 K/uL    Eosinophils Relative 2  0 - 5 %    Eosinophils Absolute 0.2  0.0 - 0.7 K/uL    Basophils Relative 0  0 - 1 %    Basophils Absolute 0.0  0.0 - 0.1 K/uL   BASIC METABOLIC PANEL     Status: Abnormal   Collection Time   01/21/12  2:16 PM      Component Value Range Comment   Sodium 141  135 - 145 mEq/L    Potassium 4.2  3.5 - 5.1 mEq/L    Chloride 105  96 - 112 mEq/L    CO2 27  19 - 32 mEq/L    Glucose, Bld 111 (*) 70 - 99 mg/dL    BUN 12  6 - 23 mg/dL    Creatinine, Ser 0.98  0.50 - 1.10 mg/dL    Calcium 9.8  8.4 - 11.9 mg/dL    GFR calc non Af Amer 86 (*) >90 mL/min    GFR calc Af Amer >90  >90 mL/min   URINALYSIS, ROUTINE W REFLEX MICROSCOPIC     Status: Abnormal   Collection Time   01/21/12  8:53 PM      Component Value Range Comment   Color, Urine STRAW (*) YELLOW     APPearance CLEAR  CLEAR    Specific Gravity, Urine <1.005 (*) 1.005 - 1.030    pH 5.0  5.0 - 8.0    Glucose, UA NEGATIVE  NEGATIVE mg/dL    Hgb urine dipstick NEGATIVE  NEGATIVE    Bilirubin Urine NEGATIVE  NEGATIVE    Ketones, ur NEGATIVE  NEGATIVE mg/dL    Protein, ur NEGATIVE  NEGATIVE mg/dL    Urobilinogen, UA 0.2  0.0 - 1.0 mg/dL    Nitrite NEGATIVE  NEGATIVE    Leukocytes, UA NEGATIVE  NEGATIVE MICROSCOPIC NOT DONE ON URINES WITH NEGATIVE PROTEIN, BLOOD, LEUKOCYTES, NITRITE, OR GLUCOSE <1000 mg/dL.   Ct Abdomen Pelvis W Contrast  01/21/2012  *RADIOLOGY REPORT*  Clinical Data: Left lower quadrant pain, vomiting, bloody diarrhea, cramping, history diverticulitis, hypertension  CT ABDOMEN AND PELVIS WITH CONTRAST  Technique:  Multidetector CT imaging of the abdomen and pelvis was performed following the standard protocol during bolus administration of intravenous contrast. Sagittal and coronal MPR images reconstructed from axial data set.  Contrast: OMNIPAQUE IOHEXOL 300 MG/ML  SOLN Dilute oral contrast.  Comparison: None  Findings: Lung bases clear. 9 mm indeterminate lesion at inferior pole right kidney series 5 image 25. 8 mm indeterminate lesion at upper pole left kidney series 2 image 25. Gallbladder surgically absent. Liver, spleen, pancreas, kidneys, and adrenal glands otherwise unremarkable. Diffuse diverticulosis of the colon with wall thickening of the colon beginning at the distal descending colon extending to the midsigmoid. Mild infiltrative changes of pericolic fat. Colonic wall thickening extends over approximately 18 cm length. This could represent a long segment of diverticulitis or colitis. No evidence of perforation or abscess. Decompressed bladder. Uterus surgically absent with normal sized ovaries and normal- appearing appendix. Stomach and bowel loops otherwise normal appearance. No mass, adenopathy, free fluid, free air or hernia. No acute osseous findings.  IMPRESSION:  Long segment of colonic wall thickening extending from the distal descending colon through the mid sigmoid colon over 18 cm length, the associated pericolic inflammation. Scattered colonic diverticulosis.  With the length of colonic involvement, this could represent a very long segment of diverticulitis or a segmental colitis of the descending/sigmoid colon. No evidence of perforation or abscess.   Original Report Authenticated By: Lollie Marrow, M.D.     Review of Systems  Constitutional: Negative.   HENT: Negative.   Eyes: Negative.   Respiratory: Negative.   Cardiovascular: Negative.   Gastrointestinal: Positive for abdominal pain, diarrhea and blood in stool.  Genitourinary: Negative.   Musculoskeletal: Negative.   Skin: Negative.   Neurological: Negative.   Endo/Heme/Allergies: Negative.   Psychiatric/Behavioral: Negative.     Blood pressure 129/81, pulse 80, temperature 98.2 F (36.8 C), temperature source Oral, resp. rate 20, SpO2 97.00%. Physical Exam  Constitutional: She is oriented to person, place, and time. She appears well-developed and well-nourished. No distress.  HENT:  Head: Normocephalic and atraumatic.  Right Ear: External ear normal.  Left Ear: External ear normal.  Nose: Nose normal.  Mouth/Throat: Oropharynx is clear and moist. No oropharyngeal exudate.  Eyes: Conjunctivae normal are normal. Pupils are equal, round, and reactive to light. Right eye exhibits no discharge. Left eye exhibits no discharge. No scleral icterus.  Neck: Normal range of motion. Neck supple.  Cardiovascular: Normal rate and regular rhythm.   Respiratory: Effort normal and breath sounds normal. No respiratory distress. She has no wheezes. She has no rales.  GI: Soft. Bowel sounds are normal. She exhibits no distension. There is no tenderness. There is no rebound and no guarding.  Musculoskeletal: Normal range of motion. She exhibits no edema and no tenderness.  Neurological: She is alert  and oriented to person, place, and time.  Skin: Skin is warm and dry. She is not diaphoretic.     Assessment/Plan #1. Colitis - most likely diverticulitis. Patient has been placed on Cipro and Flagyl which we will continue. Check stool for C. difficile PCR and also stool culture. Continue pain relief medications and hydration. #2. Hypertension - continue home medications. #3. Hypothyroidism - continue home medications. #4. Hyperlipidemia - continue home medications.  CODE STATUS - full code.  Tracey Evans N. 01/21/2012, 11:12 PM

## 2012-01-21 NOTE — Progress Notes (Signed)
66yo female w/ h/o diverticulitis c/o abdominal pain, vomiting, and diarrhea x1 day, CT shows scattered diverticulosis with length of colonic involvement suggesting diverticulitis or colitis, to begin IV ABX.  Will begin Cipro 400mg  IV Q12H for CrCl ~21ml/min.  Vernard Gambles, PharmD, BCPS 01/21/2012 11:21 PM

## 2012-01-22 DIAGNOSIS — E669 Obesity, unspecified: Secondary | ICD-10-CM | POA: Diagnosis not present

## 2012-01-22 DIAGNOSIS — E039 Hypothyroidism, unspecified: Secondary | ICD-10-CM | POA: Diagnosis not present

## 2012-01-22 DIAGNOSIS — K5732 Diverticulitis of large intestine without perforation or abscess without bleeding: Secondary | ICD-10-CM | POA: Diagnosis not present

## 2012-01-22 DIAGNOSIS — I10 Essential (primary) hypertension: Secondary | ICD-10-CM | POA: Diagnosis not present

## 2012-01-22 LAB — CBC WITH DIFFERENTIAL/PLATELET
Basophils Absolute: 0.1 10*3/uL (ref 0.0–0.1)
Basophils Relative: 1 % (ref 0–1)
Eosinophils Absolute: 0.3 10*3/uL (ref 0.0–0.7)
Eosinophils Relative: 3 % (ref 0–5)
HCT: 33.4 % — ABNORMAL LOW (ref 36.0–46.0)
Hemoglobin: 11 g/dL — ABNORMAL LOW (ref 12.0–15.0)
Lymphocytes Relative: 38 % (ref 12–46)
Lymphs Abs: 3.6 10*3/uL (ref 0.7–4.0)
MCH: 29.9 pg (ref 26.0–34.0)
MCHC: 32.9 g/dL (ref 30.0–36.0)
MCV: 90.8 fL (ref 78.0–100.0)
Monocytes Absolute: 0.9 10*3/uL (ref 0.1–1.0)
Monocytes Relative: 10 % (ref 3–12)
Neutro Abs: 4.7 10*3/uL (ref 1.7–7.7)
Neutrophils Relative %: 50 % (ref 43–77)
Platelets: 287 10*3/uL (ref 150–400)
RBC: 3.68 MIL/uL — ABNORMAL LOW (ref 3.87–5.11)
RDW: 14.3 % (ref 11.5–15.5)
WBC: 9.5 10*3/uL (ref 4.0–10.5)

## 2012-01-22 LAB — COMPREHENSIVE METABOLIC PANEL
Albumin: 3.4 g/dL — ABNORMAL LOW (ref 3.5–5.2)
Alkaline Phosphatase: 88 U/L (ref 39–117)
BUN: 9 mg/dL (ref 6–23)
CO2: 28 mEq/L (ref 19–32)
Chloride: 103 mEq/L (ref 96–112)
GFR calc non Af Amer: 68 mL/min — ABNORMAL LOW (ref >90)
Glucose, Bld: 104 mg/dL — ABNORMAL HIGH (ref 70–99)
Potassium: 3.4 mEq/L — ABNORMAL LOW (ref 3.5–5.1)
Total Bilirubin: 0.7 mg/dL (ref 0.3–1.2)

## 2012-01-22 MED ORDER — INFLUENZA VIRUS VACC SPLIT PF IM SUSP
0.5000 mL | INTRAMUSCULAR | Status: AC
  Administered 2012-01-23: 0.5 mL via INTRAMUSCULAR
  Filled 2012-01-22: qty 0.5

## 2012-01-22 MED ORDER — POTASSIUM CHLORIDE CRYS ER 20 MEQ PO TBCR
40.0000 meq | EXTENDED_RELEASE_TABLET | Freq: Once | ORAL | Status: AC
  Administered 2012-01-22: 40 meq via ORAL
  Filled 2012-01-22: qty 2

## 2012-01-22 NOTE — Progress Notes (Signed)
Patient seen and examined by me.  Agree with plan. Patient is already improving with n/V but still abdominal tenderness.  Plan to advance diet and hope for d/c in AM.  Marlin Canary DO

## 2012-01-22 NOTE — Progress Notes (Signed)
Pt admitted and oriented to unit. A&O with no skin issues. Pt is currently resting comfortably in bed.

## 2012-01-22 NOTE — Progress Notes (Signed)
[facility] PROGRESS NOTE  Tracey Evans UEA:[id number] DOB: October 30, 1945 DOA: 01/21/2012 PCP: Neena Rhymes, MD  Assessment/Plan: #1. Colitis - most likely diverticulitis. Reports much improved from time of admission. Continue cipro and Flagyl  Day #2. Marland Kitchen Stool for C. difficile PCR still needs to be collected. Continue clear liquid diet. If tolerates will consider advancing for dinner.  Continue pain relief medications and hydration.   #2. Hypokalemia: mild. Likely related to #1. Will replete and recheck.   #2. Hypertension - controlled.  continue home medications.  #3. Hypothyroidism - TSH 2.5 3 months ago.  continue home medications.  #4. Hyperlipidemia - lipid panel within normal limits.  continue home medications #5. Rt ankle impingement s/p arthroscopy 01/04/12. Currently non-weight bearing. Pain controlled.    Code Status: full Family Communication: patient at bedside Disposition Plan: Home when medically stable hopefully 24 hours.    Consultants:  none  Procedures: None  Antibiotics:  Cipro 10/14>>>  Flagyl 10/14 >>>>   HPI/Subjective: Lying in bed awake alert NAD. States she feels much better today but it is difficult to sleep  Objective: Filed Vitals:   01/21/12 2306 01/21/12 2341 01/22/12 0206 01/22/12 0440  BP: 129/81 145/98 119/74 116/74  Pulse: 80 81  65  Temp: 98.2 F (36.8 C) 98.1 F (36.7 C)  98 F (36.7 C)  TempSrc: Oral Oral  Oral  Resp: 20 18  17   Height:  5\' 6"  (1.676 m)    Weight:  94.983 kg (209 lb 6.4 oz)    SpO2: 97% 96%  95%    Intake/Output Summary (Last 24 hours) at 01/22/12 0945 Last data filed at 01/22/12 0441  Gross per 24 hour  Intake 338.33 ml  Output    350 ml  Net -11.67 ml   Filed Weights   01/21/12 2341  Weight: 94.983 kg (209 lb 6.4 oz)    Exam:   General:  Awake alert oreinted x3  Cardiovascular: RRR No MGR No LEE  Respiratory: Normal effort. BSCTAB No wheeze/rhonchi  Abdomen: obese, soft +BS  moderate tenderness upper left quadrant.   Data Reviewed: Basic Metabolic Panel:  Lab 01/22/12 4782 01/21/12 1416  NA 138 141  K 3.4* 4.2  CL 103 105  CO2 28 27  GLUCOSE 104* 111*  BUN 9 12  CREATININE 0.87 0.77  CALCIUM 8.8 9.8  MG -- --  PHOS -- --   Liver Function Tests:  Lab 01/22/12 0555  AST 15  ALT 14  ALKPHOS 88  BILITOT 0.7  PROT 6.0  ALBUMIN 3.4*   No results found for this basename: LIPASE:5,AMYLASE:5 in the last 168 hours No results found for this basename: AMMONIA:5 in the last 168 hours CBC:  Lab 01/22/12 0555 01/21/12 1416  WBC 9.5 11.6*  NEUTROABS 4.7 6.8  HGB 11.0* 12.8  HCT 33.4* 38.3  MCV 90.8 89.9  PLT 287 353   Cardiac Enzymes: No results found for this basename: CKTOTAL:5,CKMB:5,CKMBINDEX:5,TROPONINI:5 in the last 168 hours BNP (last 3 results) No results found for this basename: PROBNP:3 in the last 8760 hours CBG: No results found for this basename: GLUCAP:5 in the last 168 hours  No results found for this or any previous visit (from the past 240 hour(s)).   Studies: Ct Abdomen Pelvis W Contrast  01/21/2012  *RADIOLOGY REPORT*  Clinical Data: Left lower quadrant pain, vomiting, bloody diarrhea, cramping, history diverticulitis, hypertension  CT ABDOMEN AND PELVIS WITH CONTRAST  Technique:  Multidetector CT imaging of the abdomen and pelvis was  performed following the standard protocol during bolus administration of intravenous contrast. Sagittal and coronal MPR images reconstructed from axial data set.  Contrast: OMNIPAQUE IOHEXOL 300 MG/ML  SOLN Dilute oral contrast.  Comparison: None  Findings: Lung bases clear. 9 mm indeterminate lesion at inferior pole right kidney series 5 image 25. 8 mm indeterminate lesion at upper pole left kidney series 2 image 25. Gallbladder surgically absent. Liver, spleen, pancreas, kidneys, and adrenal glands otherwise unremarkable. Diffuse diverticulosis of the colon with wall thickening of the colon  beginning at the distal descending colon extending to the midsigmoid. Mild infiltrative changes of pericolic fat. Colonic wall thickening extends over approximately 18 cm length. This could represent a long segment of diverticulitis or colitis. No evidence of perforation or abscess. Decompressed bladder. Uterus surgically absent with normal sized ovaries and normal- appearing appendix. Stomach and bowel loops otherwise normal appearance. No mass, adenopathy, free fluid, free air or hernia. No acute osseous findings.  IMPRESSION: Long segment of colonic wall thickening extending from the distal descending colon through the mid sigmoid colon over 18 cm length, the associated pericolic inflammation. Scattered colonic diverticulosis. With the length of colonic involvement, this could represent a very long segment of diverticulitis or a segmental colitis of the descending/sigmoid colon. No evidence of perforation or abscess.   Original Report Authenticated By: Lollie Marrow, M.D.     Scheduled Meds:   . ALPRAZolam  0.5 mg Oral BID  . amLODipine  5 mg Oral Daily   And  . irbesartan  150 mg Oral Daily  . aspirin EC  325 mg Oral BID  . atorvastatin  20 mg Oral Daily  . ciprofloxacin  400 mg Intravenous Once  . famotidine  20 mg Oral Daily  . influenza  inactive virus vaccine  0.5 mL Intramuscular Tomorrow-1000  . levothyroxine  50 mcg Oral Q0600  . metoprolol tartrate  25 mg Oral BID  . metronidazole  500 mg Intravenous Once  . metronidazole  500 mg Intravenous Q8H  .  morphine injection  4 mg Intravenous Once  . ondansetron (ZOFRAN) IV  4 mg Intravenous Once  . sodium chloride  1,000 mL Intravenous Once  . DISCONTD: amLODipine-valsartan  1 tablet Oral Daily  . DISCONTD: iohexol  20 mL Oral Q1 Hr x 2   Continuous Infusions:   . sodium chloride 100 mL/hr at 01/22/12 0911    Principal Problem:  *Colitis Active Problems:  HYPOTHYROIDISM  HYPERLIPIDEMIA  HYPERTENSION    Time spent: 30  minutes    Gwenyth Bender NP [facility]  If 8PM-8AM, please contact night-coverage at [email or web address], password [facility] 01/22/2012, 9:45 AM  LOS: 1 day

## 2012-01-22 NOTE — Care Management Note (Unsigned)
    Page 1 of 1   01/22/2012     3:45:25 PM   CARE MANAGEMENT NOTE 01/22/2012  Patient:  Tracey Evans, Tracey Evans   Account Number:  [id number]  Date Initiated:  01/22/2012  Documentation initiated by:  Letha Cape  Subjective/Objective Assessment:   dx colitis  admit- lives with daughter. Pateint active with [facility] and would like to continue.     Action/Plan:   Anticipated DC Date:  01/23/2012   Anticipated DC Plan:  HOME W HOME HEALTH SERVICES      DC Planning Services  CM consult      [facility] Choice  Resumption Of Svcs/PTA Provider   Choice offered to / List presented to:  C-1 Patient        HH arranged  HH-2 PT      [facility] agency  [facility]   Status of service:  In process, will continue to follow Medicare Important Message given?   (If response is "NO", the following Medicare IM given date fields will be blank) Date Medicare IM given:   Date Additional Medicare IM given:    Discharge Disposition:    Per UR Regulation:  Reviewed for med. necessity/level of care/duration of stay  If discussed at Long Length of Stay Meetings, dates discussed:    Comments:  01/22/12 15:43 Letha Cape RN, BSN [phone] patient lives with daughter, patient is active with [facility] for hhpt and would like to continue.  Will need order for hhpt.  Patient has medication coverage and transportation. NCM will continue to follow for dc needs.

## 2012-01-23 DIAGNOSIS — I10 Essential (primary) hypertension: Secondary | ICD-10-CM | POA: Diagnosis not present

## 2012-01-23 DIAGNOSIS — K5732 Diverticulitis of large intestine without perforation or abscess without bleeding: Secondary | ICD-10-CM | POA: Diagnosis not present

## 2012-01-23 LAB — BASIC METABOLIC PANEL
Calcium: 8.8 mg/dL (ref 8.4–10.5)
GFR calc Af Amer: 77 mL/min — ABNORMAL LOW (ref >90)
GFR calc non Af Amer: 67 mL/min — ABNORMAL LOW (ref >90)
Sodium: 141 mEq/L (ref 135–145)

## 2012-01-23 LAB — CLOSTRIDIUM DIFFICILE BY PCR: Toxigenic C. Difficile by PCR: NEGATIVE

## 2012-01-23 LAB — CBC
MCH: 29.7 pg (ref 26.0–34.0)
MCHC: 32.5 g/dL (ref 30.0–36.0)
Platelets: 269 10*3/uL (ref 150–400)

## 2012-01-23 MED ORDER — ACETAMINOPHEN 325 MG PO TABS: 650.0000 mg | ORAL_TABLET | Freq: Four times a day (QID) | ORAL | Status: DC | PRN

## 2012-01-23 MED ORDER — CIPROFLOXACIN IN D5W 400 MG/200ML IV SOLN
400.0000 mg | Freq: Two times a day (BID) | INTRAVENOUS | Status: DC
  Administered 2012-01-23: 400 mg via INTRAVENOUS
  Filled 2012-01-23 (×2): qty 200

## 2012-01-23 MED ORDER — CIPROFLOXACIN HCL 500 MG PO TABS: 500.0000 mg | ORAL_TABLET | Freq: Two times a day (BID) | ORAL | Status: DC

## 2012-01-23 MED ORDER — METRONIDAZOLE 500 MG PO TABS: 500.0000 mg | ORAL_TABLET | Freq: Three times a day (TID) | ORAL | Status: DC

## 2012-01-23 NOTE — Progress Notes (Signed)
ANTIBIOTIC CONSULT NOTE - FOLLOW UP  Pharmacy Consult for Cipro Indication: Diverticulosis with colonic involvement  Allergies  Allergen Reactions  . Dilaudid (Hydromorphone Hcl) Anaphylaxis  . Adhesive (Tape)     blister  . Codeine     REACTION: nausea ,insomnia  . Lactose Intolerance (Gi)     Patient Measurements: Height: 5\' 6"  (167.6 cm) Weight: 209 lb 6.4 oz (94.983 kg) IBW/kg (Calculated) : 59.3  Adjusted Body Weight:   Vital Signs: Temp: 98 F (36.7 C) (10/16 0619) Temp src: Oral (10/16 0619) BP: 96/61 mmHg (10/16 0619) Pulse Rate: 73  (10/16 0619) Intake/Output from previous day: 10/15 0701 - 10/16 0700 In: 2203.3 [P.O.:120; I.V.:2083.3] Out: 250 [Urine:250] Intake/Output from this shift: Total I/O In: 120 [P.O.:120] Out: -   Labs:  Basename 01/23/12 0550 01/22/12 0555 01/21/12 1416  WBC 7.0 9.5 11.6*  HGB 10.2* 11.0* 12.8  PLT 269 287 353  LABCREA -- -- --  CREATININE 0.89 0.87 0.77   Estimated Creatinine Clearance: 73.2 ml/min (by C-G formula based on Cr of 0.89). No results found for this basename: VANCOTROUGH:2,VANCOPEAK:2,VANCORANDOM:2,GENTTROUGH:2,GENTPEAK:2,GENTRANDOM:2,TOBRATROUGH:2,TOBRAPEAK:2,TOBRARND:2,AMIKACINPEAK:2,AMIKACINTROU:2,AMIKACIN:2, in the last 72 hours   Microbiology: Recent Results (from the past 720 hour(s))  CLOSTRIDIUM DIFFICILE BY PCR     Status: Normal   Collection Time   01/22/12  9:52 PM      Component Value Range Status Comment   C difficile by pcr NEGATIVE  NEGATIVE Final     Anti-infectives     Start     Dose/Rate Route Frequency Ordered Stop   01/23/12 1000   ciprofloxacin (CIPRO) IVPB 400 mg        400 mg 200 mL/hr over 60 Minutes Intravenous Every 12 hours 01/23/12 0904     01/21/12 2330   metroNIDAZOLE (FLAGYL) IVPB 500 mg        500 mg 100 mL/hr over 60 Minutes Intravenous Every 8 hours 01/21/12 2315     01/21/12 2145   ciprofloxacin (CIPRO) IVPB 400 mg        400 mg 200 mL/hr over 60 Minutes  Intravenous  Once 01/21/12 2141 01/21/12 2349   01/21/12 2145   metroNIDAZOLE (FLAGYL) IVPB 500 mg        500 mg 100 mL/hr over 60 Minutes Intravenous  Once 01/21/12 2141 01/22/12 0111          Assessment: 66yo female w/ h/o diverticulitis c/o abdominal pain, vomiting, and developing bloody diarrhea x1 day, CT shows scattered diverticulosis with length of colonic involvement suggesting diverticulitis or colitis, to begin IV ABX. Will begin Cipro 400mg  IV Q12H for CrCl ~6ml/min.  Anticoagulation: SCDs Infectious Disease: Diverticulitis/Colitis. Afebrile. WBC down to 7 Scr 0.89(CrCl 73 Cipro 10/14>> Flagyl 10/14>>  Plan:  Continue Cipro 400mg  IV q12h. Pharmacy will sign off. Please re-consult for additional dosing assistance or if CrCl falls much <50  Merilynn Finland, [employer] 01/23/2012,10:07 AM

## 2012-01-23 NOTE — Progress Notes (Signed)
   CARE MANAGEMENT NOTE 01/23/2012  Patient:  Tracey Evans, Tracey Evans   Account Number:  [id number]  Date Initiated:  01/22/2012  Documentation initiated by:  Letha Cape  Subjective/Objective Assessment:   dx colitis  admit- lives with daughter. Pateint active with [facility] and would like to continue.     Action/Plan:   Anticipated DC Date:  01/23/2012   Anticipated DC Plan:  HOME W HOME HEALTH SERVICES      DC Planning Services  CM consult      [facility] Choice  Resumption Of Svcs/PTA Provider   Choice offered to / List presented to:  C-1 Patient        HH arranged  HH-2 PT      [facility] agency  [facility]   Status of service:  Completed, signed off Medicare Important Message given?   (If response is "NO", the following Medicare IM given date fields will be blank) Date Medicare IM given:   Date Additional Medicare IM given:    Discharge Disposition:  HOME W HOME HEALTH SERVICES  Per UR Regulation:  Reviewed for med. necessity/level of care/duration of stay  If discussed at Long Length of Stay Meetings, dates discussed:    Comments:  01/23/2012 1300 Notified [facility] with HH to make aware pt is d/c home today. Orders for resumption of care are in EPIC. [facility] added to dc instructions. Isidoro Donning RN CCM Case Mgmt phone [phone]  01/22/12 15:43 Letha Cape RN, BSN [phone] patient lives with daughter, patient is active with [facility] for hhpt and would like to continue.  Will need order for hhpt.  Patient has medication coverage and transportation. NCM will continue to follow for dc needs.

## 2012-01-23 NOTE — Discharge Summary (Signed)
Physician Discharge Summary  Tracey Evans JXB:[id number] DOB: 06/05/1945 DOA: 01/21/2012  PCP: Neena Rhymes, MD  Admit date: 01/21/2012 Discharge date: 01/23/2012      Follow-up Information    Follow up with Neena Rhymes, MD. (in 1-2weeks, call for appt upon discharge)    Contact information:   [address] [place] [address] [phone]       Please follow up. Insurance account manager, as scheduled)         Discharge Diagnoses:  Principal Problem:  *Colitis-Most likely diverticulitis Active Problems:  HYPOTHYROIDISM  HYPERLIPIDEMIA  HYPERTENSION   Discharge Condition: Improved/stable  Diet recommendation: Low fiber  Filed Weights   01/21/12 2341  Weight: 94.983 kg (209 lb 6.4 oz)    History of present illness:  Pt is a 66 year-old female with history of hypertension and hypothyroidism presented with complaints of having multiple episodes of diarrhea with abdominal discomfort. Her symptoms started off from yesterday. She has had multiple episodes of diarrhea which eventually had become bloody. Patient also has been experiencing diffuse colicky abdominal pain. The pain is not related to her eating or diarrhea. Patient has had previous episodes of diverticulitis. She has had a colonoscopy a year and a half. As per patient the colonoscopy was showing only diverticulosis with no polyps. At this time CT abdomen and pelvis was done which shows sigmoid colitis. Patient has been admitted for further management.   Hospital Course by problem list:   #1. Colitis - most likely diverticulitis. Upon admission the patient had a CT scan of his her abdomen and pelvis which showed long segment of colonic wall thickening extending from the distal descending colon from the mid sigmoid colon at 18 cm length with scattered colonic diverticulosis. Per radiologist the length of colonic involvement this could represent a very long segment of diverticulitis or segmental colitis  of the descending/sigmoid colon. No evidence of perforation or abscess. She was started on empiric antibiotics with Cipro and Flagyl. Stool for C. difficile PCR was done and came back negative. She rapidly improved on the antibiotics, her diet was advanced and she has tolerated it well. She has no further abdominal pain or diarrhea, she is medically ready for discharge at this time on oral antibiotics. She is to followup with her PCP outpatient, and she'll need referral (by her PCP) to GI outpatient for possible him colonoscopy once she is completely recovered from this acute episode. #2. Hypokalemia: mild. -Resolved, her potassium was pleaded in the hospital. #2. Hypertension - controlled. continue home medications.  #3. Hypothyroidism - TSH 2.5 3 months ago. continue home medications.  #4. Hyperlipidemia - lipid panel within normal limits. continue home medications  #5. Rt ankle impingement s/p arthroscopy 01/04/12. Currently non-weight bearing. Pain controlled.      Procedures:  none  Consultations:  none  Discharge Exam: Filed Vitals:   01/22/12 1016 01/22/12 1403 01/22/12 2109 01/23/12 0619  BP: 116/73 120/68 104/64 96/61  Pulse: 72 65 79 73  Temp:  98.6 F (37 C) 98.3 F (36.8 C) 98 F (36.7 C)  TempSrc:  Oral Oral Oral  Resp:  18 18 16   Height:      Weight:      SpO2:  96% 94% 95%    Exam:  General: Awake alert oreinted x3  Cardiovascular: RRR No MGR  Respiratory: Normal effort. BSCTAB No wheeze/rhonchi  Abdomen: obese, soft +BS moderate tenderness upper left quadrant. Extremities: No cyanosis or edema  Discharge Instructions  Discharge Orders  Future Orders Please Complete By Expires   Diet low fiber      Increase activity slowly          Medication List     As of 01/23/2012 11:17 AM    TAKE these medications         acetaminophen 325 MG tablet   Commonly known as: TYLENOL   Take 2 tablets (650 mg total) by mouth every 6 (six) hours as needed (or  Fever >/= 101).      albuterol 108 (90 BASE) MCG/ACT inhaler   Commonly known as: PROVENTIL HFA;VENTOLIN HFA   Inhale 2 puffs into the lungs every 4 (four) hours as needed.      amLODipine-valsartan 5-160 MG per tablet   Commonly known as: EXFORGE   Take 1 tablet by mouth daily.      aspirin EC 325 MG tablet   Take 325 mg by mouth 2 (two) times daily.      atorvastatin 20 MG tablet   Commonly known as: LIPITOR   Take 1 tablet (20 mg total) by mouth daily.      ciprofloxacin 500 MG tablet   Commonly known as: CIPRO   Take 1 tablet (500 mg total) by mouth 2 (two) times daily.      FLEXERIL 5 MG tablet   Generic drug: cyclobenzaprine   Take 5 mg by mouth 2 (two) times daily as needed. For pain      indomethacin 25 MG capsule   Commonly known as: INDOCIN   Take 25 mg by mouth 3 (three) times daily with meals.      levothyroxine 50 MCG tablet   Commonly known as: SYNTHROID, LEVOTHROID   Take 1 tablet (50 mcg total) by mouth daily.      meclizine 25 MG tablet   Commonly known as: ANTIVERT   Take 25 mg by mouth 3 (three) times daily as needed. For vertigo      metoprolol tartrate 25 MG tablet   Commonly known as: LOPRESSOR   Take 25 mg by mouth 2 (two) times daily.      metroNIDAZOLE 500 MG tablet   Commonly known as: FLAGYL   Take 1 tablet (500 mg total) by mouth 3 (three) times daily.      NITROSTAT 0.4 MG SL tablet   Generic drug: nitroGLYCERIN   Place 0.4 mg under the tongue every 5 (five) minutes as needed. As needed for chest pain      ranitidine 150 MG tablet   Commonly known as: ZANTAC   Take 150 mg by mouth 2 (two) times daily.      vitamin C 500 MG tablet   Commonly known as: ASCORBIC ACID   Take 1 tablet (500 mg total) by mouth daily.      XANAX 0.5 MG tablet   Generic drug: ALPRAZolam   Take 0.5 mg by mouth 2 (two) times daily at 10 AM and 5 PM.           Follow-up Information    Follow up with Neena Rhymes, MD. (in 1-2weeks, call for appt  upon discharge)    Contact information:   [phone] [place] [address] [phone]       Please follow up. (Orthopedic surgeon, as scheduled)           The results of significant diagnostics from this hospitalization (including imaging, microbiology, ancillary and laboratory) are listed below for reference.    Significant Diagnostic Studies: Ct Abdomen  Pelvis W Contrast  01/21/2012  *RADIOLOGY REPORT*  Clinical Data: Left lower quadrant pain, vomiting, bloody diarrhea, cramping, history diverticulitis, hypertension  CT ABDOMEN AND PELVIS WITH CONTRAST  Technique:  Multidetector CT imaging of the abdomen and pelvis was performed following the standard protocol during bolus administration of intravenous contrast. Sagittal and coronal MPR images reconstructed from axial data set.  Contrast: OMNIPAQUE IOHEXOL 300 MG/ML  SOLN Dilute oral contrast.  Comparison: None  Findings: Lung bases clear. 9 mm indeterminate lesion at inferior pole right kidney series 5 image 25. 8 mm indeterminate lesion at upper pole left kidney series 2 image 25. Gallbladder surgically absent. Liver, spleen, pancreas, kidneys, and adrenal glands otherwise unremarkable. Diffuse diverticulosis of the colon with wall thickening of the colon beginning at the distal descending colon extending to the midsigmoid. Mild infiltrative changes of pericolic fat. Colonic wall thickening extends over approximately 18 cm length. This could represent a long segment of diverticulitis or colitis. No evidence of perforation or abscess. Decompressed bladder. Uterus surgically absent with normal sized ovaries and normal- appearing appendix. Stomach and bowel loops otherwise normal appearance. No mass, adenopathy, free fluid, free air or hernia. No acute osseous findings.  IMPRESSION: Long segment of colonic wall thickening extending from the distal descending colon through the mid sigmoid colon over 18 cm length, the associated  pericolic inflammation. Scattered colonic diverticulosis. With the length of colonic involvement, this could represent a very long segment of diverticulitis or a segmental colitis of the descending/sigmoid colon. No evidence of perforation or abscess.   Original Report Authenticated By: Lollie Marrow, M.D.     Microbiology: Recent Results (from the past 240 hour(s))  CLOSTRIDIUM DIFFICILE BY PCR     Status: Normal   Collection Time   01/22/12  9:52 PM      Component Value Range Status Comment   C difficile by pcr NEGATIVE  NEGATIVE Final      Labs: Basic Metabolic Panel:  Lab 01/23/12 9604 01/22/12 0555 01/21/12 1416  NA 141 138 141  K 4.2 3.4* 4.2  CL 110 103 105  CO2 24 28 27   GLUCOSE 100* 104* 111*  BUN 9 9 12   CREATININE 0.89 0.87 0.77  CALCIUM 8.8 8.8 9.8  MG -- -- --  PHOS -- -- --   Liver Function Tests:  Lab 01/22/12 0555  AST 15  ALT 14  ALKPHOS 88  BILITOT 0.7  PROT 6.0  ALBUMIN 3.4*   No results found for this basename: LIPASE:5,AMYLASE:5 in the last 168 hours No results found for this basename: AMMONIA:5 in the last 168 hours CBC:  Lab 01/23/12 0550 01/22/12 0555 01/21/12 1416  WBC 7.0 9.5 11.6*  NEUTROABS -- 4.7 6.8  HGB 10.2* 11.0* 12.8  HCT 31.4* 33.4* 38.3  MCV 91.3 90.8 89.9  PLT 269 287 353   Cardiac Enzymes: No results found for this basename: CKTOTAL:5,CKMB:5,CKMBINDEX:5,TROPONINI:5 in the last 168 hours BNP: BNP (last 3 results) No results found for this basename: PROBNP:3 in the last 8760 hours CBG: No results found for this basename: GLUCAP:5 in the last 168 hours  Time coordinating discharge: <35minutes  Signed:  Jerrica Thorman C  [facility] 01/23/2012, 11:17 AM

## 2012-01-23 NOTE — Progress Notes (Signed)
Nsg Discharge Note  Admit Date:  01/21/2012 Discharge date: 01/23/2012   Tracey Evans to be D/C'd Home per MD order.  AVS completed.  Copy for chart, and copy for patient signed, and dated. Patient/caregiver able to verbalize understanding.  Discharge Medication:  Tracey, Evans  Home Medication Instructions JYN:[id number]   Printed on:01/23/12 1620  Medication Information                    cyclobenzaprine (FLEXERIL) 5 MG tablet Take 5 mg by mouth 2 (two) times daily as needed. For pain           meclizine (ANTIVERT) 25 MG tablet Take 25 mg by mouth 3 (three) times daily as needed. For vertigo           nitroGLYCERIN (NITROSTAT) 0.4 MG SL tablet Place 0.4 mg under the tongue every 5 (five) minutes as needed. As needed for chest pain            ALPRAZolam (XANAX) 0.5 MG tablet Take 0.5 mg by mouth 2 (two) times daily at 10 AM and 5 PM.            albuterol (PROVENTIL HFA;VENTOLIN HFA) 108 (90 BASE) MCG/ACT inhaler Inhale 2 puffs into the lungs every 4 (four) hours as needed.           amLODipine-valsartan (EXFORGE) 5-160 MG per tablet Take 1 tablet by mouth daily.           atorvastatin (LIPITOR) 20 MG tablet Take 1 tablet (20 mg total) by mouth daily.           levothyroxine (SYNTHROID, LEVOTHROID) 50 MCG tablet Take 1 tablet (50 mcg total) by mouth daily.           ranitidine (ZANTAC) 150 MG tablet Take 150 mg by mouth 2 (two) times daily.           vitamin C (ASCORBIC ACID) 500 MG tablet Take 1 tablet (500 mg total) by mouth daily.           indomethacin (INDOCIN) 25 MG capsule Take 25 mg by mouth 3 (three) times daily with meals.           metoprolol tartrate (LOPRESSOR) 25 MG tablet Take 25 mg by mouth 2 (two) times daily.           aspirin EC 325 MG tablet Take 325 mg by mouth 2 (two) times daily.           acetaminophen (TYLENOL) 325 MG tablet Take 2 tablets (650 mg total) by mouth every 6 (six) hours as needed (or Fever >/= 101).             ciprofloxacin (CIPRO) 500 MG tablet Take 1 tablet (500 mg total) by mouth 2 (two) times daily.           metroNIDAZOLE (FLAGYL) 500 MG tablet Take 1 tablet (500 mg total) by mouth 3 (three) times daily.             Discharge Assessment: Filed Vitals:   01/23/12 0619  BP: 96/61  Pulse: 73  Temp: 98 F (36.7 C)  Resp: 16   Skin clean, dry and intact without evidence of skin break down, no evidence of skin tears noted. IV catheter discontinued intact. Site without signs and symptoms of complications - no redness or edema noted at insertion site, patient denies c/o pain - only slight tenderness at site.  Dressing with slight pressure applied.  D/c  Instructions-Education: Discharge instructions given to patient/family with verbalized understanding. D/c education completed with patient/family including follow up instructions, medication list, d/c activities limitations if indicated, with other d/c instructions as indicated by MD - patient able to verbalize understanding, all questions fully answered. Patient instructed to return to ED, call 911, or call MD for any changes in condition.  Patient escorted via WC, and D/C home via private auto.  Tracey Evans [name], RN 01/23/2012 4:20 PM

## 2012-01-24 DIAGNOSIS — Z4789 Encounter for other orthopedic aftercare: Secondary | ICD-10-CM | POA: Diagnosis not present

## 2012-01-24 DIAGNOSIS — M6281 Muscle weakness (generalized): Secondary | ICD-10-CM | POA: Diagnosis not present

## 2012-01-24 DIAGNOSIS — IMO0001 Reserved for inherently not codable concepts without codable children: Secondary | ICD-10-CM | POA: Diagnosis not present

## 2012-01-24 DIAGNOSIS — F329 Major depressive disorder, single episode, unspecified: Secondary | ICD-10-CM | POA: Diagnosis not present

## 2012-01-24 DIAGNOSIS — R269 Unspecified abnormalities of gait and mobility: Secondary | ICD-10-CM | POA: Diagnosis not present

## 2012-01-24 NOTE — ED Provider Notes (Signed)
Medical screening examination/treatment/procedure(s) were performed by non-physician practitioner and as supervising physician I was immediately available for consultation/collaboration.  Flint Melter, MD 01/24/12 [phone]

## 2012-01-26 LAB — STOOL CULTURE

## 2012-01-28 DIAGNOSIS — M6281 Muscle weakness (generalized): Secondary | ICD-10-CM | POA: Diagnosis not present

## 2012-01-28 DIAGNOSIS — F329 Major depressive disorder, single episode, unspecified: Secondary | ICD-10-CM | POA: Diagnosis not present

## 2012-01-28 DIAGNOSIS — R269 Unspecified abnormalities of gait and mobility: Secondary | ICD-10-CM | POA: Diagnosis not present

## 2012-01-28 DIAGNOSIS — IMO0001 Reserved for inherently not codable concepts without codable children: Secondary | ICD-10-CM | POA: Diagnosis not present

## 2012-01-28 DIAGNOSIS — Z4789 Encounter for other orthopedic aftercare: Secondary | ICD-10-CM | POA: Diagnosis not present

## 2012-01-29 DIAGNOSIS — H16109 Unspecified superficial keratitis, unspecified eye: Secondary | ICD-10-CM | POA: Diagnosis not present

## 2012-01-29 DIAGNOSIS — IMO0001 Reserved for inherently not codable concepts without codable children: Secondary | ICD-10-CM | POA: Diagnosis not present

## 2012-01-29 DIAGNOSIS — M6281 Muscle weakness (generalized): Secondary | ICD-10-CM | POA: Diagnosis not present

## 2012-01-29 DIAGNOSIS — H04129 Dry eye syndrome of unspecified lacrimal gland: Secondary | ICD-10-CM | POA: Diagnosis not present

## 2012-01-29 DIAGNOSIS — Z4789 Encounter for other orthopedic aftercare: Secondary | ICD-10-CM | POA: Diagnosis not present

## 2012-01-29 DIAGNOSIS — F329 Major depressive disorder, single episode, unspecified: Secondary | ICD-10-CM | POA: Diagnosis not present

## 2012-01-29 DIAGNOSIS — R269 Unspecified abnormalities of gait and mobility: Secondary | ICD-10-CM | POA: Diagnosis not present

## 2012-01-31 DIAGNOSIS — R269 Unspecified abnormalities of gait and mobility: Secondary | ICD-10-CM | POA: Diagnosis not present

## 2012-01-31 DIAGNOSIS — IMO0001 Reserved for inherently not codable concepts without codable children: Secondary | ICD-10-CM | POA: Diagnosis not present

## 2012-01-31 DIAGNOSIS — F329 Major depressive disorder, single episode, unspecified: Secondary | ICD-10-CM | POA: Diagnosis not present

## 2012-01-31 DIAGNOSIS — Z4789 Encounter for other orthopedic aftercare: Secondary | ICD-10-CM | POA: Diagnosis not present

## 2012-01-31 DIAGNOSIS — M6281 Muscle weakness (generalized): Secondary | ICD-10-CM | POA: Diagnosis not present

## 2012-02-04 DIAGNOSIS — I10 Essential (primary) hypertension: Secondary | ICD-10-CM | POA: Diagnosis not present

## 2012-02-04 DIAGNOSIS — E7800 Pure hypercholesterolemia, unspecified: Secondary | ICD-10-CM | POA: Diagnosis not present

## 2012-02-04 DIAGNOSIS — M928 Other specified juvenile osteochondrosis: Secondary | ICD-10-CM | POA: Diagnosis not present

## 2012-02-06 ENCOUNTER — Encounter: Payer: Self-pay | Admitting: Family Medicine

## 2012-02-06 ENCOUNTER — Ambulatory Visit (INDEPENDENT_AMBULATORY_CARE_PROVIDER_SITE_OTHER): Payer: Medicare Other | Admitting: Family Medicine

## 2012-02-06 VITALS — BP 119/80 | HR 79 | Temp 97.9°F | Ht 65.0 in | Wt 210.0 lb

## 2012-02-06 DIAGNOSIS — E876 Hypokalemia: Secondary | ICD-10-CM | POA: Diagnosis not present

## 2012-02-06 DIAGNOSIS — R7309 Other abnormal glucose: Secondary | ICD-10-CM

## 2012-02-06 DIAGNOSIS — R7303 Prediabetes: Secondary | ICD-10-CM

## 2012-02-06 DIAGNOSIS — K5289 Other specified noninfective gastroenteritis and colitis: Secondary | ICD-10-CM | POA: Diagnosis not present

## 2012-02-06 DIAGNOSIS — E119 Type 2 diabetes mellitus without complications: Secondary | ICD-10-CM | POA: Insufficient documentation

## 2012-02-06 DIAGNOSIS — K529 Noninfective gastroenteritis and colitis, unspecified: Secondary | ICD-10-CM

## 2012-02-06 LAB — BASIC METABOLIC PANEL
GFR: 92.28 mL/min (ref >60.00)
Potassium: 3.6 mEq/L (ref 3.5–5.1)
Sodium: 143 mEq/L (ref 135–145)

## 2012-02-06 LAB — HEMOGLOBIN A1C: Hgb A1c MFr Bld: 6 % (ref 4.6–6.5)

## 2012-02-06 NOTE — Assessment & Plan Note (Signed)
New at last labs.  Pt reports she has improved her diet, eliminated sweets/snacking.  Unable to exercise due to recent ankle surgery.  Repeat labs and determine if meds are required.  Pt expressed understanding and is in agreement w/ plan.

## 2012-02-06 NOTE — Patient Instructions (Addendum)
We'll notify you of your lab results You can start to normalize your diet Call with any questions or concerns I'm so glad you're feeling better! Happy [birth year] Birthday!!!

## 2012-02-06 NOTE — Assessment & Plan Note (Signed)
New during hospitalization.  Likely due to diarrhea.  Repeat labs to determine whether pt needs additional repletion.  Pt expressed understanding and is in agreement w/ plan.

## 2012-02-06 NOTE — Assessment & Plan Note (Signed)
New to provider.  Pt reports pain has resolved since hospital d/c.  Pt would like to resume normal diet- no reason not to at this time.  Discussed the possibility that this could have been B Cerreus food poisoning from reheated rice.  Reviewed supportive care and red flags that should prompt return.  Pt expressed understanding and is in agreement w/ plan.

## 2012-02-06 NOTE — Progress Notes (Signed)
  Subjective:    Patient ID: Tracey Evans, female    DOB: 22-Nov-1945, 66 y.o.   MRN: [id number]  HPI Colitis- hospital f/u.  Admitted 10/14-10/16.  sxs started 2 hrs after eating leftover [employer].  Severe abdominal cramping, distention.  Diarrhea for 5 hrs, started vomiting.  Diarrhea stopped but pain localized to LLQ.  Pt had BRBPR the next day.  Went to ER- had 4 hr wait prior to being seen.  Pt reports pain has resolved.  Continues low fiber diet.  'it's like i have no control over the muscles in my rectum'.  Pt will have BM 30 minutes after eating, 'it comes out like baby food and i have no control'.  Hyperactive BS.  Borderline DM- pt reports she has improved her diet since last labs.  Unable to exercise due to recent ankle surgery.  Hypokalemia- noted during hospitalization, repleted during admission.  Review of Systems For ROS see HPI     Objective:   Physical Exam  Vitals reviewed. Constitutional: She is oriented to person, place, and time. She appears well-developed and well-nourished. No distress.  HENT:  Head: Normocephalic and atraumatic.  Eyes: Conjunctivae normal and EOM are normal. Pupils are equal, round, and reactive to light.  Neck: Normal range of motion. Neck supple. No thyromegaly present.  Cardiovascular: Normal rate, regular rhythm, normal heart sounds and intact distal pulses.   No murmur heard. Pulmonary/Chest: Effort normal and breath sounds normal. No respiratory distress. She has no wheezes. She has no rales.  Abdominal: Soft. She exhibits no distension. There is no tenderness. There is no rebound and no guarding.  Lymphadenopathy:    She has no cervical adenopathy.  Neurological: She is alert and oriented to person, place, and time.  Skin: Skin is warm and dry.  Psychiatric: She has a normal mood and affect. Her behavior is normal.          Assessment & Plan:

## 2012-02-07 DIAGNOSIS — M928 Other specified juvenile osteochondrosis: Secondary | ICD-10-CM | POA: Diagnosis not present

## 2012-02-12 DIAGNOSIS — M928 Other specified juvenile osteochondrosis: Secondary | ICD-10-CM | POA: Diagnosis not present

## 2012-02-14 DIAGNOSIS — M25579 Pain in unspecified ankle and joints of unspecified foot: Secondary | ICD-10-CM | POA: Diagnosis not present

## 2012-02-18 DIAGNOSIS — M25579 Pain in unspecified ankle and joints of unspecified foot: Secondary | ICD-10-CM | POA: Diagnosis not present

## 2012-02-20 DIAGNOSIS — M25579 Pain in unspecified ankle and joints of unspecified foot: Secondary | ICD-10-CM | POA: Diagnosis not present

## 2012-02-26 DIAGNOSIS — M25579 Pain in unspecified ankle and joints of unspecified foot: Secondary | ICD-10-CM | POA: Diagnosis not present

## 2012-02-28 DIAGNOSIS — M25579 Pain in unspecified ankle and joints of unspecified foot: Secondary | ICD-10-CM | POA: Diagnosis not present

## 2012-03-03 DIAGNOSIS — M25579 Pain in unspecified ankle and joints of unspecified foot: Secondary | ICD-10-CM | POA: Diagnosis not present

## 2012-03-05 DIAGNOSIS — M25579 Pain in unspecified ankle and joints of unspecified foot: Secondary | ICD-10-CM | POA: Diagnosis not present

## 2012-03-11 DIAGNOSIS — M25579 Pain in unspecified ankle and joints of unspecified foot: Secondary | ICD-10-CM | POA: Diagnosis not present

## 2012-03-13 DIAGNOSIS — IMO0002 Reserved for concepts with insufficient information to code with codable children: Secondary | ICD-10-CM | POA: Diagnosis not present

## 2012-03-13 DIAGNOSIS — M159 Polyosteoarthritis, unspecified: Secondary | ICD-10-CM | POA: Diagnosis not present

## 2012-03-13 DIAGNOSIS — IMO0001 Reserved for inherently not codable concepts without codable children: Secondary | ICD-10-CM | POA: Diagnosis not present

## 2012-03-14 DIAGNOSIS — M25579 Pain in unspecified ankle and joints of unspecified foot: Secondary | ICD-10-CM | POA: Diagnosis not present

## 2012-03-17 DIAGNOSIS — M25579 Pain in unspecified ankle and joints of unspecified foot: Secondary | ICD-10-CM | POA: Diagnosis not present

## 2012-03-20 DIAGNOSIS — M25579 Pain in unspecified ankle and joints of unspecified foot: Secondary | ICD-10-CM | POA: Diagnosis not present

## 2012-03-24 DIAGNOSIS — M25579 Pain in unspecified ankle and joints of unspecified foot: Secondary | ICD-10-CM | POA: Diagnosis not present

## 2012-03-27 DIAGNOSIS — M25579 Pain in unspecified ankle and joints of unspecified foot: Secondary | ICD-10-CM | POA: Diagnosis not present

## 2012-04-01 ENCOUNTER — Encounter: Payer: Self-pay | Admitting: Family Medicine

## 2012-04-01 ENCOUNTER — Ambulatory Visit (INDEPENDENT_AMBULATORY_CARE_PROVIDER_SITE_OTHER): Payer: Medicare Other | Admitting: Family Medicine

## 2012-04-01 VITALS — BP 128/80 | HR 82 | Temp 98.3°F | Ht 65.0 in | Wt 218.0 lb

## 2012-04-01 DIAGNOSIS — N318 Other neuromuscular dysfunction of bladder: Secondary | ICD-10-CM | POA: Diagnosis not present

## 2012-04-01 DIAGNOSIS — R32 Unspecified urinary incontinence: Secondary | ICD-10-CM | POA: Diagnosis not present

## 2012-04-01 DIAGNOSIS — N3281 Overactive bladder: Secondary | ICD-10-CM | POA: Insufficient documentation

## 2012-04-01 LAB — POCT URINALYSIS DIPSTICK
Blood, UA: NEGATIVE
Glucose, UA: NEGATIVE
Nitrite, UA: NEGATIVE
Urobilinogen, UA: 0.2

## 2012-04-01 MED ORDER — MIRABEGRON ER 25 MG PO TB24: 25.0000 mg | ORAL_TABLET | Freq: Every day | ORAL | Status: DC

## 2012-04-01 NOTE — Addendum Note (Signed)
Addended by: Verdie Shire on: 04/01/2012 12:02 PM   Modules accepted: Orders

## 2012-04-01 NOTE — Assessment & Plan Note (Signed)
New.  Pt's urinary incontinence consistent w/ OAB.  No evidence of infxn but will culture.  Since pt already has dry eye requiring drops, will start Myrbetriq to minimize side effects.  Samples given.  If no improvement, will refer to urology.  Reviewed supportive care and red flags that should prompt return.  Pt expressed understanding and is in agreement w/ plan.

## 2012-04-01 NOTE — Progress Notes (Signed)
  Subjective:    Patient ID: Tracey Evans, female    DOB: Aug 11, 1945, 66 y.o.   MRN: [id number]  HPI Urinary incontinence- pt had nerve block for ankle surgery in September.  Continues to have R foot numbness.  Pt concerned this is impacting her bladder but nerve block was given at level of knee.  Had bladder urgency and some incontinence immediately after surgery but thought it was due to her immobility issues.  Pt reports currently she will get the urge and immediately need to urinate- 'there's no holding it'.  The complete inability to control urine started 2 weeks ago.  Not having urge to urinate overnight but as soon as she stands up in the AM will need to run directly to the restroom and 90% of the time doesn't make it.  Pt already w/ dry eye.   Review of Systems For ROS see HPI     Objective:   Physical Exam  Vitals reviewed. Constitutional: She is oriented to person, place, and time. She appears well-developed and well-nourished. No distress.  Abdominal: Soft. She exhibits no distension. There is no tenderness (no suprapubic or CVA tenderness).  Neurological: She is alert and oriented to person, place, and time.  Skin: Skin is warm and dry.  Psychiatric: She has a normal mood and affect. Her behavior is normal. Thought content normal.          Assessment & Plan:

## 2012-04-01 NOTE — Patient Instructions (Addendum)
Start the Myrbetriq daily for overactive bladder 1 tab daily If no improvement in 3-4 weeks- call and we'll set you up w/ urology Hang in there! Merry Christmas!!

## 2012-04-03 LAB — URINE CULTURE: Organism ID, Bacteria: NO GROWTH

## 2012-04-11 ENCOUNTER — Telehealth: Payer: Self-pay | Admitting: *Deleted

## 2012-04-11 NOTE — Telephone Encounter (Signed)
Pt states that she has samples of med and will continue with them, and hopefully after they are complete symptoms will not return and if they do she will call back for a referral

## 2012-04-11 NOTE — Telephone Encounter (Signed)
That is the only med in it's class and the other meds carry the side effects we talked about- dry eye, dry mouth, etc.  At this time, would refer to urology if she's interested

## 2012-04-11 NOTE — Telephone Encounter (Signed)
Pt left VM that the mirabegron is too expensive at $332. Pt is requesting a cheaper med.Please advise

## 2012-04-13 NOTE — Telephone Encounter (Signed)
If the meds are working, we can continue to give samples as long as we have them

## 2012-04-16 NOTE — Telephone Encounter (Signed)
Discuss with patient  

## 2012-05-05 DIAGNOSIS — M25579 Pain in unspecified ankle and joints of unspecified foot: Secondary | ICD-10-CM | POA: Diagnosis not present

## 2012-05-05 DIAGNOSIS — E7800 Pure hypercholesterolemia, unspecified: Secondary | ICD-10-CM | POA: Diagnosis not present

## 2012-05-05 DIAGNOSIS — I10 Essential (primary) hypertension: Secondary | ICD-10-CM | POA: Diagnosis not present

## 2012-05-08 ENCOUNTER — Other Ambulatory Visit: Payer: Self-pay | Admitting: Family Medicine

## 2012-05-08 DIAGNOSIS — Z803 Family history of malignant neoplasm of breast: Secondary | ICD-10-CM

## 2012-05-08 DIAGNOSIS — Z1231 Encounter for screening mammogram for malignant neoplasm of breast: Secondary | ICD-10-CM

## 2012-05-12 ENCOUNTER — Ambulatory Visit (INDEPENDENT_AMBULATORY_CARE_PROVIDER_SITE_OTHER): Payer: Medicare Other | Admitting: Family Medicine

## 2012-05-12 ENCOUNTER — Encounter: Payer: Self-pay | Admitting: Family Medicine

## 2012-05-12 VITALS — BP 110/60 | HR 73 | Temp 97.8°F | Ht 65.25 in | Wt 220.0 lb

## 2012-05-12 DIAGNOSIS — G609 Hereditary and idiopathic neuropathy, unspecified: Secondary | ICD-10-CM

## 2012-05-12 DIAGNOSIS — G629 Polyneuropathy, unspecified: Secondary | ICD-10-CM

## 2012-05-12 DIAGNOSIS — R42 Dizziness and giddiness: Secondary | ICD-10-CM | POA: Diagnosis not present

## 2012-05-12 DIAGNOSIS — G620 Drug-induced polyneuropathy: Secondary | ICD-10-CM | POA: Insufficient documentation

## 2012-05-12 MED ORDER — NITROGLYCERIN 0.4 MG SL SUBL: 0.4000 mg | SUBLINGUAL_TABLET | SUBLINGUAL | Status: DC | PRN

## 2012-05-12 MED ORDER — MECLIZINE HCL 25 MG PO TABS: 25.0000 mg | ORAL_TABLET | Freq: Three times a day (TID) | ORAL | Status: DC | PRN

## 2012-05-12 NOTE — Patient Instructions (Addendum)
We'll notify you of your neuro appt You have new scripts for meclizine and nitro at the pharmacy Call with any questions or concerns Hang in there!!!

## 2012-05-12 NOTE — Progress Notes (Signed)
  Subjective:    Patient ID: Tracey Evans, female    DOB: 1945/09/13, 67 y.o.   MRN: [id number]  HPI Neuropathy- had nerve conduction study done by Dr Ethelene Hal.  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  Recommended she see a neurologist.  Pt was told by Dr Lestine Box that they are 'not sure what caused the numbness'.  Was previously on Mentax w/out relief.  Pt had bad rxn to Lyrica- 'i get homicidal'.  Pt interested in seeing Neuro.  Having burning pain in R foot that will take her breath away.  Pt has spoken w/ risk management Lady Gary Jean Rosenthal) and anesthesia who told her that 'if feeling didn't return after 8 hrs then something went wrong'.  Pt reports Dr Lestine Box told her not to speak to other doctors involved but risk management told her she can speak to whomever she wants.  Vertigo- pt was having sxs and took expired Meclizine for 1 week w/ improvement in sxs.  sxs were intermittent, not continuous.  Still having sensation of wetness in the ears.  No pain.   Review of Systems For ROS see HPI     Objective:   Physical Exam  Vitals reviewed. Constitutional: She is oriented to person, place, and time. She appears well-developed and well-nourished. No distress.  HENT:  Head: Normocephalic and atraumatic.  Mouth/Throat: Uvula is midline and mucous membranes are normal.       TMs WNL No TTP over sinuses Minimal nasal congestion  Eyes: Conjunctivae normal and EOM are normal. Pupils are equal, round, and reactive to light.       2-3 beats of horizontal nystagmus  Neck: Normal range of motion. Neck supple.  Cardiovascular: Normal rate, regular rhythm, normal heart sounds and intact distal pulses.   Pulmonary/Chest: Effort normal and breath sounds normal. No respiratory distress. She has no wheezes. She has no rales.  Musculoskeletal: She exhibits no  edema.  Lymphadenopathy:    She has no cervical adenopathy.  Neurological: She is alert and oriented to person, place, and time. She has normal reflexes. No cranial nerve deficit.       + Gilberto Better  Skin: Skin is warm and dry.  Psychiatric: She has a normal mood and affect. Her behavior is normal. Judgment and thought content normal.          Assessment & Plan:

## 2012-05-13 NOTE — Assessment & Plan Note (Signed)
Recurrent issue for pt.  No sxs today.  Refill meclizine.

## 2012-05-13 NOTE — Assessment & Plan Note (Signed)
Pt very unhappy w/ Ortho's explanation and reaction to her current foot numbness.  Pt has gone so far as to call risk management.  Will refer to neuro for complete evaluation.  Total time spent w/ pt- 30+ minutes, >50% spent counseling

## 2012-05-14 ENCOUNTER — Other Ambulatory Visit: Payer: Self-pay | Admitting: Family Medicine

## 2012-05-14 NOTE — Telephone Encounter (Signed)
refill Metoprolol Tartrate (Tab) 25 MG Take 25 mg by mouth 2 (two) times daily #180 wt/1-refill last fill 11.11.13

## 2012-05-14 NOTE — Addendum Note (Signed)
Addended by: Sheliah Hatch on: 05/14/2012 03:23 PM   Modules accepted: Orders

## 2012-05-15 MED ORDER — METOPROLOL TARTRATE 25 MG PO TABS: 25.0000 mg | ORAL_TABLET | Freq: Two times a day (BID) | ORAL | Status: DC

## 2012-05-15 NOTE — Telephone Encounter (Signed)
Rx sent to the pharmacy by e-script.//AB/CMA 

## 2012-05-28 ENCOUNTER — Other Ambulatory Visit: Payer: Self-pay | Admitting: Family Medicine

## 2012-05-28 DIAGNOSIS — G589 Mononeuropathy, unspecified: Secondary | ICD-10-CM | POA: Diagnosis not present

## 2012-05-29 ENCOUNTER — Telehealth: Payer: Self-pay | Admitting: Family Medicine

## 2012-05-29 NOTE — Telephone Encounter (Signed)
If pt started the med on 12/24, it would be highly unusual for this to cause problems 2 months later.  Her sxs are more consistent w/ viral illness and if she is having throat swelling she needs an appt.  If she is having SOB or difficulty breathing, she needs to go to the ER

## 2012-05-29 NOTE — Telephone Encounter (Signed)
Wrong dept. Correcting error

## 2012-05-29 NOTE — Telephone Encounter (Signed)
° °    Patient Information:  Caller Name: Britani  Phone: [phone]  Patient: Tracey Evans, Tracey Evans  Gender: Female  DOB: [birth year]  Age: 67 Years  PCP: Sheliah Hatch.  Office Follow Up:  Does the office need to follow up with this patient?: Yes  Instructions For The Office: Call back needed regarding flu-like symptoms and high risk.  RN Note:  Sudden onset of flu-like symptoms. Reports throat feels swollen; denies shortness of breath. Able to swallow but painful. Voided 10:00. Does not meet criteria for use of Tamiflu standing orders. Information noted and sent to staff for call back. [facility].  Symptoms  Reason For Call & Symptoms: Called regarding cough/cold/flu symptoms with nasal congestion, productive cough, body aches, intermittent headache. Concerned symptoms are side effect of new medication, Mirabegron started in 04/01/12 for stress incontinence.  Reviewed Health History In EMR: Yes  Reviewed Medications In EMR: Yes  Reviewed Allergies In EMR: Yes  Reviewed Surgeries / Procedures: Yes  Date of Onset of Symptoms: 05/27/2012  Treatments Tried: hot tea  Treatments Tried Worked: Yes  Any Fever: Yes  Fever Taken: Oral  Fever Time Of Reading: 09:30:40  Fever Last Reading: 100.0  Guideline(s) Used:  Influenza - Seasonal  Disposition Per Guideline:  Discuss with PCP and Callback by Nurse within 1 Hour  Reason For Disposition Reached:  HIGH RISK (e.g., age > 64 years, pregnant, HIV+, chronic medical condition) and flu symptoms  Advice Given:  Reassurance  The treatment of influenza depends on your main symptoms. Generally, treatment is the same as for other viral respiratory infections (colds). Bed rest is unnecessary.  Here is some care advice that should help.  Treating the Symptoms of Flu  Fever, Muscle Aches, and Headache: For fever more than 101 F (38.3 C), muscle aches, and headaches, take acetaminophen every 4-6 hours (Adults 650 mg) OR ibuprofen  every 6-8 hours (Adults 400-600 mg).  Sore Throat: Use throat lozenges, hard candy or warm chicken broth.  Hydrate: Drink extra liquids. If the air in your home is dry, use a humidifier.  No Aspirin  : Do not use aspirin for treatment of fever or pain (Reason: there is an association between influenza and Reye syndrome).  Isolation is Needed Until After the Fever is Gone:  The CDC recommends that people with influenza-like illness remain at home until at least 24 hours after they are free of fever (100 F or 37.8C).  Do NOT go to work or school.  Do NOT go to church, child care centers, shopping, or other public places.  Do NOT shake hands.  Avoid close contact with others (hugging, kissing).  Expected Course  : The fever lasts 2-3 days, the runny nose 5-10 days, and the cough 2-3 weeks.              Encounter MyChart Messages    No messages in this encounter         Created by    Basil Dess on 05/29/2012 11:30 AM

## 2012-05-29 NOTE — Telephone Encounter (Signed)
Please call pt and discuss 

## 2012-05-29 NOTE — Telephone Encounter (Signed)
Discuss with patient, appt scheduled. 

## 2012-05-29 NOTE — Telephone Encounter (Signed)
Patient Information:  Caller Name: Tylisa  Phone: [phone]  Patient: Tracey Evans, Tracey Evans  Gender: Female  DOB: 08-13-1945  Age: 67 Years  PCP: Sheliah Hatch.  Office Follow Up:  Does the office need to follow up with this patient?: Yes  Instructions For The Office: Call back needed regarding flu-like symptoms and high risk.  RN Note:  Sudden onset of flu-like symptoms. Reports throat feels swollen; denies shortness of breath.  Able to swallow but painful. Voided 10:00. Does not meet criteria for use of Tamiflu standing orders.  Information noted and sent to staff for call back.  [facility].  Symptoms  Reason For Call & Symptoms: Called regarding cough/cold/flu symptoms with nasal congestion, productive cough, body aches, intermittent headache.  Concerned symptoms are side effect of new medication, Mirabegron started in 04/01/12 for stress incontinence.  Reviewed Health History In EMR: Yes  Reviewed Medications In EMR: Yes  Reviewed Allergies In EMR: Yes  Reviewed Surgeries / Procedures: Yes  Date of Onset of Symptoms: 05/27/2012  Treatments Tried: hot tea  Treatments Tried Worked: Yes  Any Fever: Yes  Fever Taken: Oral  Fever Time Of Reading: 09:30:40  Fever Last Reading: 100.0  Guideline(s) Used:  Influenza - Seasonal  Disposition Per Guideline:   Discuss with PCP and Callback by Nurse within 1 Hour  Reason For Disposition Reached:   HIGH RISK (e.g., age > 64 years, pregnant, HIV+, chronic medical condition) and flu symptoms  Advice Given:  Reassurance  The treatment of influenza depends on your main symptoms. Generally, treatment is the same as for other viral respiratory infections (colds). Bed rest is unnecessary.  Here is some care advice that should help.  Treating the Symptoms of Flu  Fever, Muscle Aches, and Headache: For fever more than 101 F (38.3 C), muscle aches, and headaches, take acetaminophen every 4-6 hours (Adults 650 mg) OR  ibuprofen every 6-8 hours (Adults 400-600 mg).  Sore Throat: Use throat lozenges, hard candy or warm chicken broth.  Hydrate: Drink extra liquids. If the air in your home is dry, use a humidifier.  No Aspirin  : Do not use aspirin for treatment of fever or pain (Reason: there is an association between influenza and Reye syndrome).  Isolation is Needed Until After the Fever is Gone:   The CDC recommends that people with influenza-like illness remain at home until at least 24 hours after they are free of fever (100 F or 37.8C).  Do NOT go to work or school.  Do NOT go to church, child care centers, shopping, or other public places.  Do NOT shake hands.  Avoid close contact with others (hugging, kissing).  Expected Course  : The fever lasts 2-3 days, the runny nose 5-10 days, and the cough 2-3 weeks.

## 2012-05-30 ENCOUNTER — Ambulatory Visit (INDEPENDENT_AMBULATORY_CARE_PROVIDER_SITE_OTHER): Payer: Medicare Other | Admitting: Family Medicine

## 2012-05-30 ENCOUNTER — Encounter: Payer: Self-pay | Admitting: Family Medicine

## 2012-05-30 VITALS — BP 110/78 | HR 96 | Temp 98.6°F | Ht 65.25 in | Wt 221.4 lb

## 2012-05-30 DIAGNOSIS — J45909 Unspecified asthma, uncomplicated: Secondary | ICD-10-CM

## 2012-05-30 DIAGNOSIS — R05 Cough: Secondary | ICD-10-CM

## 2012-05-30 MED ORDER — PROMETHAZINE HCL 25 MG PO TABS: 25.0000 mg | ORAL_TABLET | Freq: Three times a day (TID) | ORAL | Status: DC | PRN

## 2012-05-30 MED ORDER — GUAIFENESIN-CODEINE 100-10 MG/5ML PO SYRP: 10.0000 mL | ORAL_SOLUTION | Freq: Three times a day (TID) | ORAL | Status: DC | PRN

## 2012-05-30 MED ORDER — AZITHROMYCIN 250 MG PO TABS: ORAL_TABLET | ORAL | Status: DC

## 2012-05-30 NOTE — Patient Instructions (Addendum)
Start the Zpack as directed for bronchitis Use the cough syrup as needed- may cause drowsiness Mucinex DM for daytime cough (no drowsiness) Drink plenty of fluids REST! Use the albuterol inhaler as needed Hang in there!

## 2012-05-30 NOTE — Assessment & Plan Note (Signed)
Chronic problem.  No current wheezing but cautioned pt to watch for sxs due to current bronchitis.  Encouraged inhaler use prn.  Pt expressed understanding and is in agreement w/ plan.

## 2012-05-30 NOTE — Progress Notes (Signed)
  Subjective:    Patient ID: Tracey Evans, female    DOB: 1946/01/12, 67 y.o.   MRN: [id number]  HPI URI- sxs started Tuesday w/ nasal congestion.  Wed developed sore throat.  Now 'my throat feels like it's closed up'.  Difficulty swallowing pills.  Now w/ cough.  Tm 101.  Now having chest soreness from cough.  No facial pain/pressure.  + HA.  No ear pain.  + sick contacts.  No N/V/D.  + body aches.   Review of Systems For ROS see HPI     Objective:   Physical Exam  Vitals reviewed. Constitutional: She is oriented to person, place, and time. She appears well-developed and well-nourished. No distress.  HENT:  Head: Normocephalic and atraumatic.  TMs normal bilaterally Mild nasal congestion Throat w/out erythema, edema, or exudate  Eyes: Conjunctivae and EOM are normal. Pupils are equal, round, and reactive to light.  Neck: Normal range of motion. Neck supple.  Cardiovascular: Normal rate, regular rhythm, normal heart sounds and intact distal pulses.   No murmur heard. Pulmonary/Chest: Effort normal and breath sounds normal. No respiratory distress. She has no wheezes.  + hacking cough  Lymphadenopathy:    She has no cervical adenopathy.  Neurological: She is alert and oriented to person, place, and time.          Assessment & Plan:

## 2012-05-30 NOTE — Assessment & Plan Note (Signed)
New.  Rapid flu test negative.  Pt's sxs consistent w/ bronchitis.  Due to underlying asthma, will start abx.  Codeine cough syrup- pt reports she can take this, only side effect is nausea (phenergan sent for prn use).  Reviewed supportive care and red flags that should prompt return.  Pt expressed understanding and is in agreement w/ plan.

## 2012-06-05 ENCOUNTER — Ambulatory Visit
Admission: RE | Admit: 2012-06-05 | Discharge: 2012-06-05 | Disposition: A | Discharge status: Home / Self Care | Payer: Medicare Other | Source: Ambulatory Visit | Attending: Family Medicine | Admitting: Family Medicine

## 2012-06-05 DIAGNOSIS — Z1231 Encounter for screening mammogram for malignant neoplasm of breast: Secondary | ICD-10-CM

## 2012-06-06 ENCOUNTER — Other Ambulatory Visit: Payer: Self-pay | Admitting: Family Medicine

## 2012-06-08 ENCOUNTER — Other Ambulatory Visit: Payer: Self-pay | Admitting: Family Medicine

## 2012-06-26 DIAGNOSIS — IMO0001 Reserved for inherently not codable concepts without codable children: Secondary | ICD-10-CM | POA: Diagnosis not present

## 2012-06-30 ENCOUNTER — Ambulatory Visit (HOSPITAL_BASED_OUTPATIENT_CLINIC_OR_DEPARTMENT_OTHER)
Admission: RE | Admit: 2012-06-30 | Discharge: 2012-06-30 | Disposition: A | Discharge status: Home / Self Care | Payer: Medicare Other | Source: Ambulatory Visit | Attending: Family Medicine | Admitting: Family Medicine

## 2012-06-30 ENCOUNTER — Encounter: Payer: Self-pay | Admitting: Family Medicine

## 2012-06-30 ENCOUNTER — Ambulatory Visit (INDEPENDENT_AMBULATORY_CARE_PROVIDER_SITE_OTHER): Payer: Medicare Other | Admitting: Family Medicine

## 2012-06-30 VITALS — BP 120/88 | HR 84 | Temp 98.9°F | Ht 65.25 in | Wt 216.6 lb

## 2012-06-30 DIAGNOSIS — J209 Acute bronchitis, unspecified: Secondary | ICD-10-CM

## 2012-06-30 DIAGNOSIS — R059 Cough, unspecified: Secondary | ICD-10-CM | POA: Insufficient documentation

## 2012-06-30 DIAGNOSIS — R05 Cough: Secondary | ICD-10-CM | POA: Insufficient documentation

## 2012-06-30 MED ORDER — ALBUTEROL SULFATE (5 MG/ML) 0.5% IN NEBU
2.5000 mg | INHALATION_SOLUTION | RESPIRATORY_TRACT | Status: DC
  Administered 2012-06-30: 2.5 mg via RESPIRATORY_TRACT

## 2012-06-30 MED ORDER — PREDNISONE 10 MG PO TABS: ORAL_TABLET | ORAL | Status: DC

## 2012-06-30 MED ORDER — IPRATROPIUM BROMIDE 0.02 % IN SOLN
0.5000 mg | RESPIRATORY_TRACT | Status: DC
  Administered 2012-06-30: 0.5 mg via RESPIRATORY_TRACT

## 2012-06-30 MED ORDER — ALBUTEROL SULFATE (5 MG/ML) 0.5% IN NEBU
2.5000 mg | INHALATION_SOLUTION | Freq: Once | RESPIRATORY_TRACT | Status: AC
  Administered 2012-06-30: 2.5 mg via RESPIRATORY_TRACT

## 2012-06-30 MED ORDER — IPRATROPIUM BROMIDE 0.02 % IN SOLN: 0.5000 mg | Freq: Once | RESPIRATORY_TRACT | Status: DC

## 2012-06-30 NOTE — Patient Instructions (Addendum)
Go to the [facility] and get your chest xray (1st floor [facility]) R on [address], L on [address], R at 1st light [employer]), [facility] on [address] determine the next steps based on xray [place] in there!

## 2012-06-30 NOTE — Progress Notes (Signed)
  Subjective:    Patient ID: Tracey Evans, female    DOB: Dec 16, 1945, 67 y.o.   MRN: [id number]  HPI URI- pt reports she developed sxs on Thursday, 'something felt funny in my chest'.  Developed cough later that night.  Cough is productive of 'watery bubbles'.  + nasal congestion, hoarseness.  Using cough syrup and inhaler.  No facial pain/pressure.  No ear pain.  No N/V/D.  Pt reports cough started after inhaling spicy cajun seasoning.   Review of Systems For ROS see HPI     Objective:   Physical Exam  Vitals reviewed. Constitutional: She appears well-developed and well-nourished. No distress.  HENT:  Head: Normocephalic and atraumatic.  TMs normal bilaterally Mild nasal congestion Throat w/out erythema, edema, or exudate  Eyes: Conjunctivae and EOM are normal. Pupils are equal, round, and reactive to light.  Neck: Normal range of motion. Neck supple.  Cardiovascular: Normal rate, regular rhythm, normal heart sounds and intact distal pulses.   No murmur heard. Pulmonary/Chest: Effort normal and breath sounds normal. No respiratory distress. She has no wheezes.  + hacking cough  Lymphadenopathy:    She has no cervical adenopathy.          Assessment & Plan:

## 2012-07-02 ENCOUNTER — Telehealth: Payer: Self-pay | Admitting: Family Medicine

## 2012-07-02 NOTE — Telephone Encounter (Signed)
Patient Information:  Caller Name: Yerlin  Phone: [phone]  Patient: Tracey Evans, Langelier  Gender: Female  DOB: 11-26-1945  Age: 67 Years  PCP: Sheliah Hatch.  Office Follow Up:  Does the office need to follow up with this patient?: No  Instructions For The Office: N/A  RN Note:  Notes cough is productive since began [facility] 07/01/12. Forgot to use Albuterol since 06/30/12. No chest tightness or wheezing but continues to have frequent productive cough. Shortness of breath after coughing. Has not used Peak Flow meter. Instructed to use Albuterol MDI 2 puffs every 4-6 hours. Cough stopped immediately after using Albuterol MDI. Advised to hydrate, humidify, may use antihistamine for allergy symptoms prn and suggested use plain Robitussin or Mucinex instead of Robitussin AC.  Symptoms  Reason For Call & Symptoms: Called to ask if is supposed to be taking the Robitussin AC cough syrup for cough/ lung irritation from inhaling a spicy seasoning.  Reviewed Health History In EMR: Yes  Reviewed Medications In EMR: Yes  Reviewed Allergies In EMR: Yes  Reviewed Surgeries / Procedures: Yes  Date of Onset of Symptoms: 06/26/2012  Treatments Tried: Prenisone  Treatments Tried Worked: No  Guideline(s) Used:  Asthma Attack  Disposition Per Guideline:   Home Care  Reason For Disposition Reached:   Mild asthma attack (e.g., no SOB at rest, mild SOB with walking, speaks normally in sentences, mild wheezing)  Advice Given:  Quick-Relief Asthma Medicine:   Start your quick-relief medicine (e.g., albuterol, salbutamol) at the first sign of any coughing or shortness of breath (don't wait for wheezing). Use your inhaler (2 puffs each time) or nebulizer every 4 hours. Continue the quick-relief medicine until you have not wheezed or coughed for 48 hours.  The best "cough medicine" for an adult with asthma is always the asthma medicine (Note: Don't use cough suppressants, but cough drops may  help a tickly cough).  Hay Fever  : If you have nasal symptoms from hay fever, it's OK to take antihistamines (Reasons: poor control of allergic rhinitis makes asthma worse whereas antihistamines don't make asthma worse).  Drinking Liquids:  Try to drink normal amount of liquids (e.g., water). Being adequately hydrated makes it easier to cough up the sticky lung mucus.  Humidifier:   If the air is dry, use a cool mist humidifier to prevent drying of the upper airway.  Remove Allergens:  Take a shower to remove pollens, animal dander, or other allergens from the body and hair.  Expected Course:  If treatment is started early, most asthma attacks are quickly brought under control. All wheezing should be gone by 5 days.  Patient Will Follow Care Advice:  YES

## 2012-07-02 NOTE — Telephone Encounter (Signed)
To MD to review       KP//CMA

## 2012-07-04 NOTE — Assessment & Plan Note (Signed)
Pt's PE WNL w/ exception of horrible hacking cough.  Lungs CTA, afebrile- admits to otherwise feeling well.  Question whether pt has pneumonitis from inhalation of spicy chemicals.  Will get CXR prior to any additional abx.  Start course of steroids.  Cough meds prn.  Reviewed supportive care and red flags that should prompt return.  Pt expressed understanding and is in agreement w/ plan.

## 2012-07-14 ENCOUNTER — Telehealth: Payer: Self-pay | Admitting: *Deleted

## 2012-07-14 MED ORDER — ALBUTEROL SULFATE HFA 108 (90 BASE) MCG/ACT IN AERS: 2.0000 | INHALATION_SPRAY | Freq: Four times a day (QID) | RESPIRATORY_TRACT | Status: DC | PRN

## 2012-07-14 NOTE — Telephone Encounter (Signed)
Last 06-30-12, no refill history.Please advise

## 2012-07-14 NOTE — Telephone Encounter (Signed)
Rx sent 

## 2012-07-14 NOTE — Telephone Encounter (Signed)
Ok for [employer], #1, 3 refills

## 2012-07-29 DIAGNOSIS — H43819 Vitreous degeneration, unspecified eye: Secondary | ICD-10-CM | POA: Diagnosis not present

## 2012-08-06 DIAGNOSIS — IMO0001 Reserved for inherently not codable concepts without codable children: Secondary | ICD-10-CM | POA: Diagnosis not present

## 2012-08-06 DIAGNOSIS — I10 Essential (primary) hypertension: Secondary | ICD-10-CM | POA: Diagnosis not present

## 2012-08-06 DIAGNOSIS — E7800 Pure hypercholesterolemia, unspecified: Secondary | ICD-10-CM | POA: Diagnosis not present

## 2012-09-01 ENCOUNTER — Other Ambulatory Visit: Payer: Self-pay | Admitting: Family Medicine

## 2012-09-02 NOTE — Telephone Encounter (Signed)
Med filled.  

## 2012-09-04 ENCOUNTER — Ambulatory Visit (INDEPENDENT_AMBULATORY_CARE_PROVIDER_SITE_OTHER): Payer: Medicare Other | Admitting: Family Medicine

## 2012-09-04 ENCOUNTER — Encounter: Payer: Self-pay | Admitting: Family Medicine

## 2012-09-04 VITALS — BP 120/80 | HR 64 | Temp 98.4°F | Ht 65.25 in | Wt 220.6 lb

## 2012-09-04 DIAGNOSIS — R609 Edema, unspecified: Secondary | ICD-10-CM | POA: Diagnosis not present

## 2012-09-04 DIAGNOSIS — F329 Major depressive disorder, single episode, unspecified: Secondary | ICD-10-CM | POA: Insufficient documentation

## 2012-09-04 MED ORDER — BUPROPION HCL ER (XL) 150 MG PO TB24: 150.0000 mg | ORAL_TABLET | Freq: Every day | ORAL | Status: DC

## 2012-09-04 NOTE — Progress Notes (Signed)
  Subjective:    Patient ID: Tracey Evans, female    DOB: 06-29-1945, 67 y.o.   MRN: [id number]  HPI Edema- L leg was swelling 'clear up to here' (just below knee) for 3-4 days.  Swelling has improved.  Did not have swelling on R leg.  Was told by Cards that the swelling was due to her BP meds.  Denies CP, SOB, HAs, visual changes.  Pt decreased her fluid intake (was drinking 150-200 oz of fluids daily) and the swelling resolved.  Depression/anxiety- started on Wellbutrin 150 XL by Rheum due to anger issues.  Pt reports med has increased sex drive, is much more calm on med.  Despite rheum starting med, told pt they could not refill it.   Review of Systems For ROS see HPI     Objective:   Physical Exam  Constitutional: She is oriented to person, place, and time. She appears well-developed and well-nourished. No distress.  HENT:  Head: Normocephalic and atraumatic.  Mouth/Throat: Mucous membranes are normal.  Eyes: Conjunctivae and EOM are normal. Pupils are equal, round, and reactive to light.  Neck: Normal range of motion. Neck supple.  Cardiovascular: Normal rate, regular rhythm, normal heart sounds and intact distal pulses.   Pulmonary/Chest: Effort normal and breath sounds normal. No respiratory distress. She has no wheezes.  Musculoskeletal: She exhibits no edema.  Lymphadenopathy:    She has no cervical adenopathy.  Neurological: She is alert and oriented to person, place, and time.  Skin: Skin is warm and dry.  Psychiatric: She has a normal mood and affect. Her behavior is normal. Thought content normal.          Assessment & Plan:

## 2012-09-04 NOTE — Patient Instructions (Addendum)
Things look great!  No changes in BP meds at this time I refilled the Wellbutrin Call with any questions or concerns Have a great summer!

## 2012-09-06 NOTE — Assessment & Plan Note (Signed)
New.  Started on Wellbutrin by Rheum for anger issues.  Pt feels she is doing better on medication.  Refill provided.

## 2012-09-06 NOTE — Assessment & Plan Note (Signed)
New.  No evidence on PE today.  Suspect pt's sxs were due to her excessive water intake.  No need to change Amlodipine since swelling has resolved spontaneously.  If pt again has issues, will need to switch the Norvasc.  Reviewed supportive care and red flags that should prompt return.  Pt expressed understanding and is in agreement w/ plan.

## 2012-09-15 DIAGNOSIS — IMO0002 Reserved for concepts with insufficient information to code with codable children: Secondary | ICD-10-CM | POA: Diagnosis not present

## 2012-09-15 DIAGNOSIS — M159 Polyosteoarthritis, unspecified: Secondary | ICD-10-CM | POA: Diagnosis not present

## 2012-09-15 DIAGNOSIS — IMO0001 Reserved for inherently not codable concepts without codable children: Secondary | ICD-10-CM | POA: Diagnosis not present

## 2012-09-17 DIAGNOSIS — M5430 Sciatica, unspecified side: Secondary | ICD-10-CM | POA: Diagnosis not present

## 2012-09-22 DIAGNOSIS — M5430 Sciatica, unspecified side: Secondary | ICD-10-CM | POA: Diagnosis not present

## 2012-09-25 DIAGNOSIS — M5430 Sciatica, unspecified side: Secondary | ICD-10-CM | POA: Diagnosis not present

## 2012-10-17 ENCOUNTER — Telehealth: Payer: Self-pay | Admitting: Family Medicine

## 2012-10-17 ENCOUNTER — Ambulatory Visit (INDEPENDENT_AMBULATORY_CARE_PROVIDER_SITE_OTHER): Payer: Medicare Other | Admitting: Family Medicine

## 2012-10-17 ENCOUNTER — Encounter: Payer: Self-pay | Admitting: Family Medicine

## 2012-10-17 VITALS — BP 126/86 | HR 65 | Temp 98.4°F | Wt 227.0 lb

## 2012-10-17 DIAGNOSIS — F4321 Adjustment disorder with depressed mood: Secondary | ICD-10-CM | POA: Diagnosis not present

## 2012-10-17 MED ORDER — CLONAZEPAM 0.5 MG PO TABS: 0.5000 mg | ORAL_TABLET | Freq: Every evening | ORAL | Status: DC | PRN

## 2012-10-17 NOTE — Telephone Encounter (Signed)
Patient Information:  Caller Name: Izela  Phone: [phone]  Patient: Tracey Evans, Tracey Evans  Gender: Female  DOB: July 30, 1945  Age: 67 Years  PCP: Sheliah Hatch.  Office Follow Up:  Does the office need to follow up with this patient?: No  Instructions For The Office: N/A  RN Note:  Triaged in Anxiety Offline Guideline - Disposition:  See Provider within 24 hours due to feeling overwhelmed and unable to calm down.  Symptoms  Reason For Call & Symptoms: Niece on life support died yesterday 11-01-2022, having problems dealing with this, worried about the care of her 2 children.  Today 7/11 anniversary of own mother's death.  Feels shaken up, trying to help daughter who is crying/screaming at loss of relative and grandaughter living with her.  Sleeping more when she can.  Feeling nervous, suddenly craving sugar - bought 2 dozen donuts Wed 7/9 and ate them all. Drinking liter of Pepsi daily.  Struggling with whether to get involved vs staying out of the situation. BP 119/90,  135/76, feels pulse fast but machine says 76.  Reviewed Health History In EMR: Yes  Reviewed Medications In EMR: Yes  Reviewed Allergies In EMR: Yes  Reviewed Surgeries / Procedures: Yes  Date of Onset of Symptoms: Oct 31, 2012  Treatments Tried: Takes Xanax at 8-9 am and 8-9 pm daily.  Took extra Xanax  last night at 11:00 pm   then still did not sleep until 3 am, then slept to 10:30  Treatments Tried Worked: No  Guideline(s) Used:  Depression  Disposition Per Guideline:   Home Care  Reason For Disposition Reached:   Recent death of a loved one  Advice Given:  N/A  Appt today 7/11 at 16:00 with Dr Beverely Low  Patient Will Follow Care Advice:  YES

## 2012-10-17 NOTE — Telephone Encounter (Signed)
Appt today 7/11 at 16:00 with Dr Beverely Low

## 2012-10-17 NOTE — Patient Instructions (Addendum)
Take a 1/2 tab of xanax as needed during the day Use the klonopin at night Continue the wellbutrin STAY AWAY from the SUGAR! It's ok to cry- you are stronger than you realize! Hang in there!

## 2012-10-17 NOTE — Progress Notes (Signed)
  Subjective:    Patient ID: Tracey Evans, female    DOB: 1946-02-26, 67 y.o.   MRN: [id number]  HPI Grief- 'trying to hold myself together but i don't know how much more i can deal w/'.  Drinking 2 L of pepsi at a time, ate 2 dozen donuts at 1 sitting.  Pt reports she's unable to deal w/ her grief b/c daughter is a 'screamer and can't deal w/ this'.  Sister's granddaughter is the one that passed.  Pt and sister are currently estranged.  'all i want to do is eat sugar and go to sleep'.  Today is anniversary of mother's death.  Niece passed yesterday.   Review of Systems For ROS see HPI     Objective:   Physical Exam  Vitals reviewed. Constitutional: She is oriented to person, place, and time. She appears well-developed and well-nourished. No distress.  Neurological: She is alert and oriented to person, place, and time.  Skin: Skin is warm and dry.  Psychiatric:  Anxious, tearful, appropriately upset          Assessment & Plan:

## 2012-10-21 NOTE — Assessment & Plan Note (Signed)
New.  Pt's reaction is appropriate.  Already on SSRI.  Will add Klonopin for sleep and continue the xanax for daytime use.  Encouraged counseling as needed.  Total time spent w/ pt, 28 min, >50% spent counseling.

## 2012-11-03 DIAGNOSIS — E7800 Pure hypercholesterolemia, unspecified: Secondary | ICD-10-CM | POA: Diagnosis not present

## 2012-11-03 DIAGNOSIS — I10 Essential (primary) hypertension: Secondary | ICD-10-CM | POA: Diagnosis not present

## 2012-11-05 DIAGNOSIS — E7800 Pure hypercholesterolemia, unspecified: Secondary | ICD-10-CM | POA: Diagnosis not present

## 2012-11-05 DIAGNOSIS — IMO0001 Reserved for inherently not codable concepts without codable children: Secondary | ICD-10-CM | POA: Diagnosis not present

## 2012-11-05 DIAGNOSIS — I10 Essential (primary) hypertension: Secondary | ICD-10-CM | POA: Diagnosis not present

## 2012-11-05 DIAGNOSIS — R7309 Other abnormal glucose: Secondary | ICD-10-CM | POA: Diagnosis not present

## 2012-11-07 DIAGNOSIS — M545 Low back pain: Secondary | ICD-10-CM | POA: Diagnosis not present

## 2012-11-07 DIAGNOSIS — M169 Osteoarthritis of hip, unspecified: Secondary | ICD-10-CM | POA: Diagnosis not present

## 2012-11-07 DIAGNOSIS — M25559 Pain in unspecified hip: Secondary | ICD-10-CM | POA: Diagnosis not present

## 2012-11-26 DIAGNOSIS — M25559 Pain in unspecified hip: Secondary | ICD-10-CM | POA: Diagnosis not present

## 2012-11-26 DIAGNOSIS — M169 Osteoarthritis of hip, unspecified: Secondary | ICD-10-CM | POA: Diagnosis not present

## 2012-12-02 ENCOUNTER — Other Ambulatory Visit: Payer: Self-pay | Admitting: Family Medicine

## 2012-12-02 NOTE — Telephone Encounter (Signed)
Med filled.  

## 2012-12-13 ENCOUNTER — Other Ambulatory Visit: Payer: Self-pay | Admitting: Family Medicine

## 2012-12-15 ENCOUNTER — Encounter: Payer: Self-pay | Admitting: Lab

## 2012-12-16 ENCOUNTER — Ambulatory Visit (INDEPENDENT_AMBULATORY_CARE_PROVIDER_SITE_OTHER): Payer: Medicare Other | Admitting: Family Medicine

## 2012-12-16 ENCOUNTER — Encounter: Payer: Self-pay | Admitting: Family Medicine

## 2012-12-16 VITALS — BP 130/86 | HR 73 | Temp 98.1°F | Ht 65.25 in | Wt 224.2 lb

## 2012-12-16 DIAGNOSIS — F329 Major depressive disorder, single episode, unspecified: Secondary | ICD-10-CM | POA: Diagnosis not present

## 2012-12-16 DIAGNOSIS — R7309 Other abnormal glucose: Secondary | ICD-10-CM

## 2012-12-16 DIAGNOSIS — E669 Obesity, unspecified: Secondary | ICD-10-CM | POA: Diagnosis not present

## 2012-12-16 DIAGNOSIS — R7303 Prediabetes: Secondary | ICD-10-CM

## 2012-12-16 LAB — BASIC METABOLIC PANEL
BUN: 9 mg/dL (ref 6–23)
CO2: 29 mEq/L (ref 19–32)
Chloride: 107 mEq/L (ref 96–112)
Creatinine, Ser: 0.8 mg/dL (ref 0.4–1.2)
Glucose, Bld: 110 mg/dL — ABNORMAL HIGH (ref 70–99)
Potassium: 3.9 mEq/L (ref 3.5–5.1)

## 2012-12-16 MED ORDER — SERTRALINE HCL 25 MG PO TABS: 25.0000 mg | ORAL_TABLET | Freq: Every day | ORAL | Status: DC

## 2012-12-16 MED ORDER — BUPROPION HCL ER (XL) 300 MG PO TB24: 300.0000 mg | ORAL_TABLET | Freq: Every day | ORAL | Status: DC

## 2012-12-16 NOTE — Patient Instructions (Addendum)
Follow up in 4-6 weeks to recheck mood Increase the Wellbutrin to 300mg - 2 of what you have at home, 1 of the new script Add the Sertraline daily We'll notify you of your lab results and make any changes if needed Call with any questions or concerns Hang in there!

## 2012-12-16 NOTE — Assessment & Plan Note (Signed)
Deteriorated.  Pt is angry, depressed, and binge eating.  Increase wellbutrin.  Add Zoloft.  Encouraged counseling- pt refused, 'it's waste of everyone's time'.  Will follow closely.

## 2012-12-16 NOTE — Assessment & Plan Note (Signed)
Chronic problem, pt is binge eating due to her depression.  Check labs.  Stressed importance of healthy eating.  Start meds prn.

## 2012-12-16 NOTE — Progress Notes (Signed)
  Subjective:    Patient ID: Tracey Evans, female    DOB: January 15, 1946, 67 y.o.   MRN: [id number]  HPI Depression- pt still grieving loss of niece and recently another niece dx'd w/ cancer on Friday.  Having anger issues- 'i really want to go out and kill every policeman i see'.  Pt had situations as a child and as an adult that has caused her to develop severe hatred.  Is binging on sugar- eating a dozen donuts at 1 sitting, in 1 week has had 6 boxes of popsicles.  Denies SI.  When pressed, denies HI.   Review of Systems For ROS see HPI     Objective:   Physical Exam  Vitals reviewed. Constitutional: She is oriented to person, place, and time. She appears well-developed and well-nourished. No distress.  Neurological: She is alert and oriented to person, place, and time.  Skin: Skin is warm and dry.  Psychiatric: She has a normal mood and affect. Her behavior is normal.  Pt w/ poor judgement, admits to irrational thought process Able to contract for personal safety and the safety of those around her          Assessment & Plan:

## 2012-12-16 NOTE — Assessment & Plan Note (Signed)
Chronic problem.  Pt is again binge eating.  Check labs.  Will follow.

## 2012-12-17 ENCOUNTER — Other Ambulatory Visit: Payer: Self-pay | Admitting: *Deleted

## 2012-12-17 ENCOUNTER — Telehealth: Payer: Self-pay | Admitting: Family Medicine

## 2012-12-17 MED ORDER — METFORMIN HCL 1000 MG PO TABS: 500.0000 mg | ORAL_TABLET | Freq: Two times a day (BID) | ORAL | Status: DC

## 2012-12-17 NOTE — Telephone Encounter (Signed)
Caller was answered by CAN. Is there anything else that you recommend for this patient?    KP

## 2012-12-17 NOTE — Telephone Encounter (Signed)
Start meds, limit sugar, try and walk regularly for exercise, limit pasta/potatoes/rice/breads (carbs).  Increase fruits and veggies- a good source of natural sugar

## 2012-12-17 NOTE — Telephone Encounter (Signed)
Patient Information:  Caller Name: Alexcia  Phone: [phone]  Patient: Tracey Evans, Tracey Evans  Gender: Female  DOB: November 24, 1945  Age: 67 Years  PCP: Sheliah Hatch.  Office Follow Up:  Does the office need to follow up with this patient?: No  Instructions For The Office: N/A   Symptoms  Reason For Call & Symptoms: Pt is calling to clarify with starting the Metformin /should she be limiting her sugar intake? RN checked notes in EPIC and saw note from Dr. Beverely Low who advised pt is to "stop the sugar". Pt notified.  Reviewed Health History In EMR: Yes  Reviewed Medications In EMR: Yes  Reviewed Allergies In EMR: Yes  Reviewed Surgeries / Procedures: Yes  Date of Onset of Symptoms: 12/16/2012  Guideline(s) Used:  No Protocol Available - Information Only  Disposition Per Guideline:   Home Care  Reason For Disposition Reached:   Information only question and nurse able to answer  Advice Given:  N/A  Patient Will Follow Care Advice:  YES

## 2012-12-18 ENCOUNTER — Encounter: Payer: Self-pay | Admitting: *Deleted

## 2012-12-18 ENCOUNTER — Encounter: Payer: Self-pay | Admitting: Family Medicine

## 2012-12-18 ENCOUNTER — Telehealth: Payer: Self-pay | Admitting: *Deleted

## 2012-12-18 NOTE — Telephone Encounter (Signed)
Made contact with patient and explained all that she needed to do to  change her eating habits and try to begin exercising at lease 30 min a day.  And she stated that she does not like to eat right ,but she will try to change her life style habits.    Ag cma

## 2012-12-22 ENCOUNTER — Telehealth: Payer: Self-pay | Admitting: General Practice

## 2012-12-22 DIAGNOSIS — M545 Low back pain: Secondary | ICD-10-CM | POA: Diagnosis not present

## 2012-12-22 NOTE — Telephone Encounter (Signed)
Pt called today stating that she is having problems with her metformin and Zoloft. States that she has had a HA, stomach cramps, and diarrhea for 4 days since beginning these meds. Please advise.

## 2012-12-22 NOTE — Telephone Encounter (Signed)
Diarrhea is common when starting metformin.  This should improve w/in 7 days.  It is also common when starting Zoloft.  Based on this, I would pick ONE medicine (it is her choice) to take alone for 1 week and then add the other.

## 2012-12-22 NOTE — Telephone Encounter (Signed)
Pt notified of the orders.

## 2012-12-31 ENCOUNTER — Other Ambulatory Visit: Payer: Self-pay | Admitting: Family Medicine

## 2013-01-01 ENCOUNTER — Other Ambulatory Visit: Payer: Self-pay | Admitting: Family Medicine

## 2013-01-01 NOTE — Telephone Encounter (Signed)
Rx filled

## 2013-01-01 NOTE — Telephone Encounter (Signed)
Med filled and sent to pharmacy.

## 2013-01-06 ENCOUNTER — Other Ambulatory Visit: Payer: Self-pay | Admitting: Family Medicine

## 2013-01-06 DIAGNOSIS — M545 Low back pain: Secondary | ICD-10-CM | POA: Diagnosis not present

## 2013-01-06 NOTE — Telephone Encounter (Signed)
Pt has not had Rx filled since 01/02/12. Tried to reach patient to clarify but was number was disconnected.

## 2013-01-08 NOTE — Telephone Encounter (Signed)
LATE ENTRY: Spoke with patient about her sugar intake. Gave her the recommendations of Dr. Beverely Low. Pt understood.

## 2013-01-15 ENCOUNTER — Telehealth: Payer: Self-pay | Admitting: *Deleted

## 2013-01-15 NOTE — Telephone Encounter (Signed)
Error

## 2013-01-15 NOTE — Telephone Encounter (Signed)
Pt needs appt- the sweating could mean her blood sugar is dropping low.

## 2013-01-15 NOTE — Telephone Encounter (Signed)
Pt called and stated that she is sweating profusely since starting metformin and zoloft. Pt also states that she ca not go get her hair done because of her sweating. She would like to know what can be done about this.  Please advise. SW, CMA

## 2013-01-15 NOTE — Telephone Encounter (Signed)
Spoke with pt. Appt made for 01/16/13 @ 9:45.

## 2013-01-16 ENCOUNTER — Encounter: Payer: Self-pay | Admitting: Family Medicine

## 2013-01-16 ENCOUNTER — Ambulatory Visit (INDEPENDENT_AMBULATORY_CARE_PROVIDER_SITE_OTHER): Payer: Medicare Other | Admitting: Family Medicine

## 2013-01-16 VITALS — BP 126/84 | HR 58 | Temp 98.0°F | Resp 16 | Wt 210.2 lb

## 2013-01-16 DIAGNOSIS — R7309 Other abnormal glucose: Secondary | ICD-10-CM | POA: Diagnosis not present

## 2013-01-16 DIAGNOSIS — Z23 Encounter for immunization: Secondary | ICD-10-CM | POA: Diagnosis not present

## 2013-01-16 DIAGNOSIS — F329 Major depressive disorder, single episode, unspecified: Secondary | ICD-10-CM

## 2013-01-16 DIAGNOSIS — R61 Generalized hyperhidrosis: Secondary | ICD-10-CM

## 2013-01-16 DIAGNOSIS — R7303 Prediabetes: Secondary | ICD-10-CM

## 2013-01-16 LAB — GLUCOSE, POCT (MANUAL RESULT ENTRY): POC Glucose: 84 mg/dl (ref 70–99)

## 2013-01-16 MED ORDER — CITALOPRAM HYDROBROMIDE 20 MG PO TABS: 20.0000 mg | ORAL_TABLET | Freq: Every day | ORAL | Status: DC

## 2013-01-16 NOTE — Assessment & Plan Note (Signed)
Much improved since adding SSRI to wellbutrin.  Will switch Zoloft to Celexa due to side effects and monitor closely.

## 2013-01-16 NOTE — Progress Notes (Signed)
  Subjective:    Patient ID: Tracey Evans, female    DOB: 10-31-1945, 67 y.o.   MRN: [id number]  HPI Sweating- pt reports sweating constantly from 'the neck up'.  Pt feels warm while sweating- 'almost like a hot flash'.  Hair will be 'soaking wet'.  Denies feeling shaky or dizzy.  sxs started ~2 weeks ago.  Started Metformin 1 month ago.  Started Zoloft after Metformin and sweating started shortly after.  Depression- pt reports mood is much improved since add the Zoloft to the wellbutrin.  DM- has lost 14 lbs.  CBG is excellent in office today.  Pt reports following ADA diet.  Wants to stop metformin.   Review of Systems For ROS see HPI     Objective:   Physical Exam  Vitals reviewed. Constitutional: She is oriented to person, place, and time. She appears well-developed and well-nourished. No distress.  HENT:  Head: Normocephalic and atraumatic.  Eyes: Conjunctivae and EOM are normal. Pupils are equal, round, and reactive to light.  Neck: Normal range of motion. Neck supple. No thyromegaly present.  Cardiovascular: Normal rate, regular rhythm, normal heart sounds and intact distal pulses.   No murmur heard. Pulmonary/Chest: Effort normal and breath sounds normal. No respiratory distress.  Abdominal: Soft. She exhibits no distension. There is no tenderness.  Musculoskeletal: She exhibits no edema.  Lymphadenopathy:    She has no cervical adenopathy.  Neurological: She is alert and oriented to person, place, and time.  Skin: Skin is warm and dry.  Psychiatric: She has a normal mood and affect. Her behavior is normal.          Assessment & Plan:

## 2013-01-16 NOTE — Assessment & Plan Note (Signed)
Pt has lost 14 lbs, is following ADA diet.  Having some nausea and diarrhea w/ metformin- would like to stop.  Given sugar of 84, will give pt a chance off metformin and recheck labs in Dec.  Pt appreciative of trial off meds.  Will follow.

## 2013-01-16 NOTE — Patient Instructions (Signed)
Schedule a diabetes follow up in December STOP the Metformin STOP the Zoloft- START the Celexa daily for the mood You can cheat on your birthday- have some cake! Keep up the good work on [employer]- you're doing great! Happy Early [place] Ouch!

## 2013-01-16 NOTE — Assessment & Plan Note (Signed)
New.  Suspect this is due to starting the Zoloft.  Switch SSRI to celexa.  Will monitor.

## 2013-01-20 DIAGNOSIS — M545 Low back pain: Secondary | ICD-10-CM | POA: Diagnosis not present

## 2013-01-28 DIAGNOSIS — H2589 Other age-related cataract: Secondary | ICD-10-CM | POA: Diagnosis not present

## 2013-01-28 DIAGNOSIS — H04129 Dry eye syndrome of unspecified lacrimal gland: Secondary | ICD-10-CM | POA: Diagnosis not present

## 2013-02-04 DIAGNOSIS — I10 Essential (primary) hypertension: Secondary | ICD-10-CM | POA: Diagnosis not present

## 2013-02-04 DIAGNOSIS — E7800 Pure hypercholesterolemia, unspecified: Secondary | ICD-10-CM | POA: Diagnosis not present

## 2013-02-04 DIAGNOSIS — IMO0001 Reserved for inherently not codable concepts without codable children: Secondary | ICD-10-CM | POA: Diagnosis not present

## 2013-02-04 DIAGNOSIS — I2510 Atherosclerotic heart disease of native coronary artery without angina pectoris: Secondary | ICD-10-CM | POA: Diagnosis not present

## 2013-02-04 DIAGNOSIS — R7309 Other abnormal glucose: Secondary | ICD-10-CM | POA: Diagnosis not present

## 2013-02-11 ENCOUNTER — Other Ambulatory Visit: Payer: Self-pay | Admitting: General Practice

## 2013-02-12 ENCOUNTER — Other Ambulatory Visit: Payer: Self-pay | Admitting: General Practice

## 2013-02-12 MED ORDER — AMLODIPINE BESYLATE-VALSARTAN 5-160 MG PO TABS: 1.0000 | ORAL_TABLET | Freq: Every day | ORAL | Status: DC

## 2013-03-04 ENCOUNTER — Other Ambulatory Visit: Payer: Self-pay | Admitting: Family Medicine

## 2013-03-04 NOTE — Telephone Encounter (Signed)
Med filled.  

## 2013-03-11 ENCOUNTER — Encounter: Payer: Self-pay | Admitting: Family Medicine

## 2013-03-11 ENCOUNTER — Ambulatory Visit (INDEPENDENT_AMBULATORY_CARE_PROVIDER_SITE_OTHER): Payer: Medicare Other | Admitting: Family Medicine

## 2013-03-11 VITALS — BP 124/78 | HR 63 | Temp 98.3°F | Resp 16 | Wt 208.5 lb

## 2013-03-11 DIAGNOSIS — E119 Type 2 diabetes mellitus without complications: Secondary | ICD-10-CM | POA: Diagnosis not present

## 2013-03-11 DIAGNOSIS — I10 Essential (primary) hypertension: Secondary | ICD-10-CM | POA: Diagnosis not present

## 2013-03-11 DIAGNOSIS — E669 Obesity, unspecified: Secondary | ICD-10-CM | POA: Insufficient documentation

## 2013-03-11 DIAGNOSIS — E785 Hyperlipidemia, unspecified: Secondary | ICD-10-CM | POA: Diagnosis not present

## 2013-03-11 LAB — CBC WITH DIFFERENTIAL/PLATELET
Basophils Relative: 0.5 % (ref 0.0–3.0)
Eosinophils Absolute: 0.2 10*3/uL (ref 0.0–0.7)
HCT: 39.4 % (ref 36.0–46.0)
Hemoglobin: 13.1 g/dL (ref 12.0–15.0)
Lymphocytes Relative: 45.1 % (ref 12.0–46.0)
Lymphs Abs: 3.2 10*3/uL (ref 0.7–4.0)
MCHC: 33.3 g/dL (ref 30.0–36.0)
MCV: 91.7 fl (ref 78.0–100.0)
Neutro Abs: 3.2 10*3/uL (ref 1.4–7.7)
RBC: 4.3 Mil/uL (ref 3.87–5.11)

## 2013-03-11 LAB — TSH: TSH: 3.59 u[IU]/mL (ref 0.35–5.50)

## 2013-03-11 LAB — LIPID PANEL
Cholesterol: 110 mg/dL (ref 0–200)
VLDL: 20 mg/dL (ref 0.0–40.0)

## 2013-03-11 LAB — BASIC METABOLIC PANEL
BUN: 13 mg/dL (ref 6–23)
Calcium: 9.2 mg/dL (ref 8.4–10.5)
Chloride: 106 mEq/L (ref 96–112)
Creatinine, Ser: 0.9 mg/dL (ref 0.4–1.2)

## 2013-03-11 LAB — HEPATIC FUNCTION PANEL
ALT: 22 U/L (ref 0–35)
Total Bilirubin: 0.7 mg/dL (ref 0.3–1.2)
Total Protein: 6.7 g/dL (ref 6.0–8.3)

## 2013-03-11 NOTE — Patient Instructions (Signed)
Follow up in 3 months to recheck diabetes We'll notify you of your lab results and make any changes if needed Keep up the good work on healthy diet and regular exercise Call with any questions or concerns Happy Holidays!!!

## 2013-03-11 NOTE — Progress Notes (Signed)
   Subjective:    Patient ID: Tracey Evans, female    DOB: [birth year], 67 y.o.   MRN: [id number]  HPI Pre visit review using our clinic review tool, if applicable. No additional management support is needed unless otherwise documented below in the visit note.  DM- new dx for pt.  Not currently on metformin.  Attempting to control w/ healthy diet and regular exercise.  On walking regularly, has lost 18 lbs on home scale.  On ARB for renal protection.  UTD on eye exam (mid-November)  HTN- chronic problem, on Metoprolol and Exforge.  Denies CP, SOB, HAs, visual changes, edema.  Hyperlipidemia- chronic problem, on Lipitor.  Denies abd pain, N/V, myalgias   Review of Systems For ROS see HPI     Objective:   Physical Exam  Vitals reviewed. Constitutional: She is oriented to person, place, and time. She appears well-developed and well-nourished. No distress.  HENT:  Head: Normocephalic and atraumatic.  Eyes: Conjunctivae and EOM are normal. Pupils are equal, round, and reactive to light.  Neck: Normal range of motion. Neck supple. No thyromegaly present.  Cardiovascular: Normal rate, regular rhythm, normal heart sounds and intact distal pulses.   No murmur heard. Pulmonary/Chest: Effort normal and breath sounds normal. No respiratory distress.  Abdominal: Soft. She exhibits no distension. There is no tenderness.  Musculoskeletal: She exhibits no edema.  Lymphadenopathy:    She has no cervical adenopathy.  Neurological: She is alert and oriented to person, place, and time.  Skin: Skin is warm and dry.  Psychiatric: She has a normal mood and affect. Her behavior is normal.          Assessment & Plan:

## 2013-03-11 NOTE — Assessment & Plan Note (Signed)
Chronic problem.  Well controlled.  Asymptomatic.  Check labs.  No anticipated med changes. 

## 2013-03-11 NOTE — Assessment & Plan Note (Signed)
Chronic problem.  Tolerating statin w/out difficulty.  Check labs.  Adjust meds prn  

## 2013-03-11 NOTE — Assessment & Plan Note (Signed)
Encouraged regular, aerobic activity for 30 minutes at least 4x/week and monitoring caloric intake w/ the help of [employer] app.  Will follow.  

## 2013-03-11 NOTE — Assessment & Plan Note (Signed)
Pt is attempting to control w/ healthy diet and regular exercise.  On ARB.  UTD on eye exam.  Foot exam done today.  Asymptomatic.  Pt has lost 18 lbs since last visit.  Applauded efforts.  Will follow closely.

## 2013-03-17 DIAGNOSIS — IMO0002 Reserved for concepts with insufficient information to code with codable children: Secondary | ICD-10-CM | POA: Diagnosis not present

## 2013-03-17 DIAGNOSIS — M159 Polyosteoarthritis, unspecified: Secondary | ICD-10-CM | POA: Diagnosis not present

## 2013-03-17 DIAGNOSIS — IMO0001 Reserved for inherently not codable concepts without codable children: Secondary | ICD-10-CM | POA: Diagnosis not present

## 2013-04-03 ENCOUNTER — Telehealth: Payer: Self-pay | Admitting: *Deleted

## 2013-04-03 NOTE — Telephone Encounter (Signed)
OTC options: for cough- Delsym, Mucinex DM, Robitussin                         Congestion- Coricidin HBP

## 2013-04-03 NOTE — Telephone Encounter (Signed)
Patient called and stated that she has been sneezing and coughing since Tuesday.Patient states that her muscles are sore form coughing so hard. Patient states that she thought at first that it was allergies at first and begun to take allergy medicine.patient states that she isn't getting any relief from it. Patient also states that she had a little tingle in her throat but believes it come from her coughing. Patient would like to know if their is anything that she can get at the drugstore? Please advise.SW

## 2013-04-03 NOTE — Telephone Encounter (Signed)
Pt.notified

## 2013-04-28 ENCOUNTER — Telehealth: Payer: Self-pay | Admitting: *Deleted

## 2013-04-28 NOTE — Telephone Encounter (Signed)
Patient called and stated that she has been experiencing dry eyes and dry mouth.Patient states that she has been using some eye drops for for the dry eyes but it is not working. She states that sometimes her eyelids get stuck together. Patient also states that she is constantly wetting her food because it is hard for her to swallow. Patient is also say she is dry heaving. Please advise. SW

## 2013-04-28 NOTE — Telephone Encounter (Signed)
Needs appt to discuss sxs.

## 2013-04-28 NOTE — Telephone Encounter (Signed)
Pt scheduled for tomorrow at 11:30am.

## 2013-04-29 ENCOUNTER — Encounter: Payer: Self-pay | Admitting: Family Medicine

## 2013-04-29 ENCOUNTER — Ambulatory Visit (INDEPENDENT_AMBULATORY_CARE_PROVIDER_SITE_OTHER): Payer: Medicare Other | Admitting: Family Medicine

## 2013-04-29 VITALS — BP 120/82 | HR 68 | Temp 98.5°F | Resp 14 | Wt 206.2 lb

## 2013-04-29 DIAGNOSIS — H04129 Dry eye syndrome of unspecified lacrimal gland: Secondary | ICD-10-CM | POA: Diagnosis not present

## 2013-04-29 DIAGNOSIS — E119 Type 2 diabetes mellitus without complications: Secondary | ICD-10-CM | POA: Diagnosis not present

## 2013-04-29 DIAGNOSIS — K219 Gastro-esophageal reflux disease without esophagitis: Secondary | ICD-10-CM | POA: Diagnosis not present

## 2013-04-29 DIAGNOSIS — L738 Other specified follicular disorders: Secondary | ICD-10-CM | POA: Diagnosis not present

## 2013-04-29 DIAGNOSIS — K117 Disturbances of salivary secretion: Secondary | ICD-10-CM | POA: Diagnosis not present

## 2013-04-29 DIAGNOSIS — L853 Xerosis cutis: Secondary | ICD-10-CM | POA: Insufficient documentation

## 2013-04-29 DIAGNOSIS — R682 Dry mouth, unspecified: Secondary | ICD-10-CM | POA: Insufficient documentation

## 2013-04-29 LAB — BASIC METABOLIC PANEL
BUN: 8 mg/dL (ref 6–23)
CHLORIDE: 106 meq/L (ref 96–112)
CO2: 30 meq/L (ref 19–32)
Calcium: 9.2 mg/dL (ref 8.4–10.5)
Creatinine, Ser: 0.7 mg/dL (ref 0.4–1.2)
GFR: 110.9 mL/min (ref >60.00)
Glucose, Bld: 88 mg/dL (ref 70–99)
POTASSIUM: 3.5 meq/L (ref 3.5–5.1)
SODIUM: 142 meq/L (ref 135–145)

## 2013-04-29 LAB — CBC WITH DIFFERENTIAL/PLATELET
BASOS ABS: 0 10*3/uL (ref 0.0–0.1)
Basophils Relative: 0.4 % (ref 0.0–3.0)
EOS ABS: 0.2 10*3/uL (ref 0.0–0.7)
Eosinophils Relative: 2.4 % (ref 0.0–5.0)
HCT: 39.4 % (ref 36.0–46.0)
HEMOGLOBIN: 13.1 g/dL (ref 12.0–15.0)
LYMPHS PCT: 39.7 % (ref 12.0–46.0)
Lymphs Abs: 3.1 10*3/uL (ref 0.7–4.0)
MCHC: 33.2 g/dL (ref 30.0–36.0)
MCV: 91.2 fl (ref 78.0–100.0)
MONOS PCT: 6.7 % (ref 3.0–12.0)
Monocytes Absolute: 0.5 10*3/uL (ref 0.1–1.0)
NEUTROS ABS: 4 10*3/uL (ref 1.4–7.7)
NEUTROS PCT: 50.8 % (ref 43.0–77.0)
PLATELETS: 275 10*3/uL (ref 150.0–400.0)
RBC: 4.31 Mil/uL (ref 3.87–5.11)
RDW: 14.5 % (ref 11.5–14.6)
WBC: 7.9 10*3/uL (ref 4.5–10.5)

## 2013-04-29 LAB — TSH: TSH: 1.69 u[IU]/mL (ref 0.35–5.50)

## 2013-04-29 NOTE — Progress Notes (Signed)
Pre visit review using our clinic review tool, if applicable. No additional management support is needed unless otherwise documented below in the visit note. 

## 2013-04-29 NOTE — Assessment & Plan Note (Signed)
New.  Check thyroid to assess.  Also get ANA to r/o sjogren's given cluster of sxs.  Encouraged her to apply lotion regularly and liberally.  Will follow.

## 2013-04-29 NOTE — Assessment & Plan Note (Signed)
New.  No obvious medications.  Check ANA to r/o Sjogren's.  Encouraged humidifier in home.  Will follow.

## 2013-04-29 NOTE — Assessment & Plan Note (Signed)
Deteriorated once pt stopped Zantac.  Pt was again having nausea.  sxs improved when she restarted Zantac 2 days ago.  Will follow.

## 2013-04-29 NOTE — Assessment & Plan Note (Signed)
New.  No obvious medications.  Check thyroid and ANA.  Encouraged humidifier in home.  Will follow.

## 2013-04-29 NOTE — Progress Notes (Signed)
   Subjective:    Patient ID: Tracey Evans, female    DOB: March 24, 1946, 68 y.o.   MRN: [id number]  HPI 'dry everything'- skin, dry eye, dry mouth.  Having difficulty swallowing pills or eating crackers.  sxs started 2-3 weeks ago.  Had bronchitis prior to Christmas and 'it may be coming from that'.  Reports apt is very dry.  Pt reports she has 'nausea- but not down here (pointing to stomach), up here (grabbing throat).  Worse in the evening.  Restarted Ranitidine yesterday and 'it made a difference'.   Review of Systems For ROS see HPI     Objective:   Physical Exam  Vitals reviewed. Constitutional: She is oriented to person, place, and time. She appears well-developed and well-nourished. No distress.  HENT:  Head: Normocephalic and atraumatic.  Mouth/Throat: Oropharynx is clear and moist.  Eyes: Conjunctivae and EOM are normal. Pupils are equal, round, and reactive to light. Right eye exhibits no discharge. Left eye exhibits no discharge.  Neck: Neck supple.  Cardiovascular: Normal rate and regular rhythm.   Pulmonary/Chest: Effort normal and breath sounds normal. No respiratory distress. She has no wheezes. She has no rales.  Lymphadenopathy:    She has no cervical adenopathy.  Neurological: She is alert and oriented to person, place, and time.  Skin: Skin is warm and dry.  Very dry skin          Assessment & Plan:

## 2013-04-29 NOTE — Patient Instructions (Addendum)
Follow up as scheduled We'll notify you of your lab results and make any changes if needed Get a humidifier at [employer] Drink plenty of fluids Apply lotion regularly Continue the Ranitidine for reflux Call with any questions or concerns Hang in there!!!

## 2013-04-30 LAB — ANA: ANA: NEGATIVE

## 2013-05-01 ENCOUNTER — Telehealth: Payer: Self-pay

## 2013-05-01 NOTE — Telephone Encounter (Signed)
Relevant patient education assigned to patient using Emmi. ° °

## 2013-05-06 DIAGNOSIS — I10 Essential (primary) hypertension: Secondary | ICD-10-CM | POA: Diagnosis not present

## 2013-05-06 DIAGNOSIS — R7309 Other abnormal glucose: Secondary | ICD-10-CM | POA: Diagnosis not present

## 2013-05-06 DIAGNOSIS — IMO0001 Reserved for inherently not codable concepts without codable children: Secondary | ICD-10-CM | POA: Diagnosis not present

## 2013-05-06 DIAGNOSIS — E7800 Pure hypercholesterolemia, unspecified: Secondary | ICD-10-CM | POA: Diagnosis not present

## 2013-05-19 ENCOUNTER — Other Ambulatory Visit: Payer: Self-pay

## 2013-05-19 DIAGNOSIS — Z1231 Encounter for screening mammogram for malignant neoplasm of breast: Secondary | ICD-10-CM

## 2013-05-21 LAB — HM DIABETES EYE EXAM

## 2013-06-05 ENCOUNTER — Telehealth: Payer: Self-pay | Admitting: Family Medicine

## 2013-06-05 MED ORDER — ALPRAZOLAM 0.5 MG PO TABS: 1.0000 mg | ORAL_TABLET | Freq: Two times a day (BID) | ORAL | Status: DC

## 2013-06-05 NOTE — Telephone Encounter (Signed)
Patient called and left msg on triage stating she has had another death in her family. She would like to know if she can double up on her medication (did not state which one). CB# [phone]

## 2013-06-05 NOTE — Telephone Encounter (Signed)
Pt notified. Chart updated to reflect this dose change.

## 2013-06-05 NOTE — Telephone Encounter (Signed)
Ok to increase Alprazolam to 2 tabs twice daily if needed- please express my sympathy

## 2013-06-08 ENCOUNTER — Ambulatory Visit
Admission: RE | Admit: 2013-06-08 | Discharge: 2013-06-08 | Disposition: A | Discharge status: Home / Self Care | Payer: Medicare Other | Source: Ambulatory Visit

## 2013-06-08 DIAGNOSIS — Z1231 Encounter for screening mammogram for malignant neoplasm of breast: Secondary | ICD-10-CM | POA: Diagnosis not present

## 2013-06-20 ENCOUNTER — Other Ambulatory Visit: Payer: Self-pay | Admitting: Family Medicine

## 2013-06-22 NOTE — Telephone Encounter (Signed)
Med filled.  

## 2013-07-05 ENCOUNTER — Other Ambulatory Visit: Payer: Self-pay | Admitting: Family Medicine

## 2013-07-06 NOTE — Telephone Encounter (Signed)
07/06/2013  Pt is requesting refill on rx metoprolol tartrate (LOPRESSOR) 25 MG.  Pt is also requesting a refill on rx ALPRAZolam (XANAX) 0.5 MG tablet; pt states that they were instructed to increase to 3 x daily instead of the 2 x daily.  Please advise.  thanks

## 2013-07-06 NOTE — Telephone Encounter (Signed)
Med filled.  

## 2013-07-07 ENCOUNTER — Other Ambulatory Visit: Payer: Self-pay | Admitting: Family Medicine

## 2013-07-07 NOTE — Telephone Encounter (Signed)
Med filled.  

## 2013-07-15 ENCOUNTER — Other Ambulatory Visit: Payer: Self-pay | Admitting: Family Medicine

## 2013-07-15 NOTE — Telephone Encounter (Signed)
Med filled.  

## 2013-07-29 DIAGNOSIS — H2589 Other age-related cataract: Secondary | ICD-10-CM | POA: Diagnosis not present

## 2013-07-29 DIAGNOSIS — H16229 Keratoconjunctivitis sicca, not specified as Sjogren's, unspecified eye: Secondary | ICD-10-CM | POA: Diagnosis not present

## 2013-08-03 ENCOUNTER — Telehealth: Payer: Self-pay | Admitting: Family Medicine

## 2013-08-03 NOTE — Telephone Encounter (Signed)
Caller name:Emmerie Relation to pt:  Call back number: [phone] Pharmacy:  Reason for call: pt is needing ALPRAZolam (XANAX) 0.5 MG refill.  And wants to discuss the possible increases of dosage that was discussed with MD previously.  Pt has questions about medications and does not know what to take and when to take.  Pt also states that they are getting angry and having alter states than usual and they just want to snap.  No RN available, transferred to call a nurse to concern of altered mental status.

## 2013-08-04 ENCOUNTER — Telehealth: Payer: Self-pay | Admitting: Family Medicine

## 2013-08-04 DIAGNOSIS — F411 Generalized anxiety disorder: Secondary | ICD-10-CM

## 2013-08-04 MED ORDER — ALPRAZOLAM 0.5 MG PO TABS: 1.0000 mg | ORAL_TABLET | Freq: Two times a day (BID) | ORAL | Status: DC

## 2013-08-04 NOTE — Telephone Encounter (Signed)
Caller name:Halie Justice Relation to [place]:[id number] Call back [phone] Pharmacy:[id number] - [place], [place]   Reason for call: Reason for call: pt is needing ALPRAZolam (XANAX) 0.5 MG refill

## 2013-08-04 NOTE — Telephone Encounter (Signed)
Spoke with patient who is requesting a refill on Xanax.  Last office visit 04/29/13 Rx filled/increased 06/05/13 (2 tabs BID) UDS 12/2012 low risk Okay to refill?

## 2013-08-04 NOTE — Telephone Encounter (Signed)
Ok for #120, no refills 

## 2013-08-04 NOTE — Telephone Encounter (Addendum)
Rx for Xanax printed, signed by provider and [facility]ed to [facility]

## 2013-08-05 DIAGNOSIS — R7309 Other abnormal glucose: Secondary | ICD-10-CM | POA: Diagnosis not present

## 2013-08-05 DIAGNOSIS — I10 Essential (primary) hypertension: Secondary | ICD-10-CM | POA: Diagnosis not present

## 2013-08-05 DIAGNOSIS — E7800 Pure hypercholesterolemia, unspecified: Secondary | ICD-10-CM | POA: Diagnosis not present

## 2013-08-05 DIAGNOSIS — I2510 Atherosclerotic heart disease of native coronary artery without angina pectoris: Secondary | ICD-10-CM | POA: Diagnosis not present

## 2013-08-05 DIAGNOSIS — IMO0001 Reserved for inherently not codable concepts without codable children: Secondary | ICD-10-CM | POA: Diagnosis not present

## 2013-08-07 ENCOUNTER — Telehealth: Payer: Self-pay | Admitting: *Deleted

## 2013-08-07 NOTE — Telephone Encounter (Signed)
Called [facility] and gave orders over the phone as the faxed script did not go through.  Xanax 0.5 mg Sig: Take 2 tablets (1 mg total ) by mouth 2 (two) times daily at 10 am and 5 pm. Dispense #120 with 0 refills.  Patient made aware

## 2013-09-03 ENCOUNTER — Other Ambulatory Visit: Payer: Self-pay | Admitting: Family Medicine

## 2013-09-04 NOTE — Telephone Encounter (Signed)
Med filled and letter mailed.  

## 2013-09-04 NOTE — Telephone Encounter (Signed)
Last OV 04-29-13 (dry mouth) Xanax #120 with 0 last filled 08-04-13 wellbutrin #30 with 6 last filled 12-16-12 Clonazepam #30 with 1 last filled 10-17-12

## 2013-09-04 NOTE — Telephone Encounter (Signed)
Ok to refill each x1 month but pt needs diabetes follow up (overdue!)

## 2013-09-15 DIAGNOSIS — M159 Polyosteoarthritis, unspecified: Secondary | ICD-10-CM | POA: Diagnosis not present

## 2013-09-15 DIAGNOSIS — IMO0001 Reserved for inherently not codable concepts without codable children: Secondary | ICD-10-CM | POA: Diagnosis not present

## 2013-09-15 DIAGNOSIS — IMO0002 Reserved for concepts with insufficient information to code with codable children: Secondary | ICD-10-CM | POA: Diagnosis not present

## 2013-09-18 ENCOUNTER — Encounter: Payer: Self-pay | Admitting: Family Medicine

## 2013-09-18 ENCOUNTER — Ambulatory Visit (INDEPENDENT_AMBULATORY_CARE_PROVIDER_SITE_OTHER): Payer: Medicare Other | Admitting: Family Medicine

## 2013-09-18 VITALS — BP 120/70 | HR 68 | Temp 98.0°F | Resp 16 | Wt 199.1 lb

## 2013-09-18 DIAGNOSIS — E785 Hyperlipidemia, unspecified: Secondary | ICD-10-CM

## 2013-09-18 DIAGNOSIS — I10 Essential (primary) hypertension: Secondary | ICD-10-CM

## 2013-09-18 DIAGNOSIS — E119 Type 2 diabetes mellitus without complications: Secondary | ICD-10-CM

## 2013-09-18 LAB — BASIC METABOLIC PANEL
BUN: 9 mg/dL (ref 6–23)
CHLORIDE: 108 meq/L (ref 96–112)
CO2: 32 mEq/L (ref 19–32)
Calcium: 9.6 mg/dL (ref 8.4–10.5)
Creatinine, Ser: 0.8 mg/dL (ref 0.4–1.2)
GFR: 86.8 mL/min (ref >60.00)
Glucose, Bld: 101 mg/dL — ABNORMAL HIGH (ref 70–99)
POTASSIUM: 3.8 meq/L (ref 3.5–5.1)
SODIUM: 148 meq/L — AB (ref 135–145)

## 2013-09-18 LAB — CBC WITH DIFFERENTIAL/PLATELET
Basophils Absolute: 0 10*3/uL (ref 0.0–0.1)
Basophils Relative: 0.5 % (ref 0.0–3.0)
EOS ABS: 0.3 10*3/uL (ref 0.0–0.7)
Eosinophils Relative: 4.2 % (ref 0.0–5.0)
HEMATOCRIT: 39.8 % (ref 36.0–46.0)
HEMOGLOBIN: 12.9 g/dL (ref 12.0–15.0)
Lymphocytes Relative: 42.3 % (ref 12.0–46.0)
Lymphs Abs: 3.1 10*3/uL (ref 0.7–4.0)
MCHC: 32.4 g/dL (ref 30.0–36.0)
MCV: 94 fl (ref 78.0–100.0)
MONOS PCT: 7.1 % (ref 3.0–12.0)
Monocytes Absolute: 0.5 10*3/uL (ref 0.1–1.0)
NEUTROS ABS: 3.3 10*3/uL (ref 1.4–7.7)
Neutrophils Relative %: 45.9 % (ref 43.0–77.0)
Platelets: 259 10*3/uL (ref 150.0–400.0)
RBC: 4.23 Mil/uL (ref 3.87–5.11)
RDW: 14.1 % (ref 11.5–15.5)
WBC: 7.3 10*3/uL (ref 4.0–10.5)

## 2013-09-18 LAB — TSH: TSH: 3.25 u[IU]/mL (ref 0.35–4.50)

## 2013-09-18 LAB — LIPID PANEL
Cholesterol: 108 mg/dL (ref 0–200)
HDL: 45.8 mg/dL (ref >39.00)
LDL Cholesterol: 49 mg/dL (ref 0–99)
NonHDL: 62.2
Total CHOL/HDL Ratio: 2
Triglycerides: 65 mg/dL (ref 0.0–149.0)
VLDL: 13 mg/dL (ref 0.0–40.0)

## 2013-09-18 LAB — HEPATIC FUNCTION PANEL
ALK PHOS: 74 U/L (ref 39–117)
ALT: 14 U/L (ref 0–35)
AST: 18 U/L (ref 0–37)
Albumin: 4.1 g/dL (ref 3.5–5.2)
BILIRUBIN DIRECT: 0.1 mg/dL (ref 0.0–0.3)
BILIRUBIN TOTAL: 0.3 mg/dL (ref 0.2–1.2)
Total Protein: 6.9 g/dL (ref 6.0–8.3)

## 2013-09-18 LAB — HEMOGLOBIN A1C: Hgb A1c MFr Bld: 6.2 % (ref 4.6–6.5)

## 2013-09-18 NOTE — Progress Notes (Signed)
Pre visit review using our clinic review tool, if applicable. No additional management support is needed unless otherwise documented below in the visit note. 

## 2013-09-18 NOTE — Patient Instructions (Signed)
Schedule your complete physical in 6 months We'll notify you of your lab results and make any change if needed Keep up the good work!  You look great! Call with any questions or concerns Have a GREAT reunion!!!

## 2013-09-18 NOTE — Progress Notes (Signed)
   Subjective:    Patient ID: Tracey Evans, female    DOB: 28-Jul-1945, 68 y.o.   MRN: [id number]  HPI DM- chronic problem, currently controlling w/ diet and exercise.  UTD on eye exam.  Continuing to lose weight.  No numbness/tingling.  HTN- chronic problem, on Metoprolol, Amlodipine-Valsartan.  denies CP, SOB, HAs, visual changes, edema.  Hyperlipidemia- chronic problem, on Lipitor.  Denies abd pain, N/V, myalgias   Review of Systems For ROS see HPI     Objective:   Physical Exam  Vitals reviewed. Constitutional: She is oriented to person, place, and time. She appears well-developed and well-nourished. No distress.  HENT:  Head: Normocephalic and atraumatic.  Eyes: Conjunctivae and EOM are normal. Pupils are equal, round, and reactive to light.  Neck: Normal range of motion. Neck supple. No thyromegaly present.  Cardiovascular: Normal rate, regular rhythm, normal heart sounds and intact distal pulses.   No murmur heard. Pulmonary/Chest: Effort normal and breath sounds normal. No respiratory distress.  Abdominal: Soft. She exhibits no distension. There is no tenderness.  Musculoskeletal: She exhibits no edema.  Lymphadenopathy:    She has no cervical adenopathy.  Neurological: She is alert and oriented to person, place, and time.  Skin: Skin is warm and dry.  Psychiatric: She has a normal mood and affect. Her behavior is normal.          Assessment & Plan:

## 2013-09-19 ENCOUNTER — Telehealth: Payer: Self-pay | Admitting: Family Medicine

## 2013-09-19 NOTE — Telephone Encounter (Signed)
Relevant patient education assigned to patient using Emmi. ° °

## 2013-09-20 NOTE — Assessment & Plan Note (Signed)
Chronic problem.  Pt continues to lose weight- applauded her efforts.  Not currently on medication.  Asymptomatic.  Check labs.  Adjust tx prn.

## 2013-09-20 NOTE — Assessment & Plan Note (Signed)
Chronic problem.  Well controlled.  Asymptomatic.  Check labs.  No anticipated med changes. 

## 2013-09-20 NOTE — Assessment & Plan Note (Signed)
Chronic problem.  Tolerating statin w/o difficulty.  Check labs.  Adjust meds prn  

## 2013-10-07 ENCOUNTER — Other Ambulatory Visit: Payer: Self-pay | Admitting: Family Medicine

## 2013-10-07 NOTE — Telephone Encounter (Signed)
Last OV 09/18/13 Alprazolam Last filled #120 with 0 08-04-13  Please advise.

## 2013-10-11 ENCOUNTER — Emergency Department (HOSPITAL_BASED_OUTPATIENT_CLINIC_OR_DEPARTMENT_OTHER): Payer: Medicare Other

## 2013-10-11 ENCOUNTER — Emergency Department (HOSPITAL_BASED_OUTPATIENT_CLINIC_OR_DEPARTMENT_OTHER)
Admission: EM | Admit: 2013-10-11 | Discharge: 2013-10-11 | Disposition: A | Discharge status: Home / Self Care | Payer: Medicare Other | Attending: Emergency Medicine | Admitting: Emergency Medicine

## 2013-10-11 ENCOUNTER — Encounter (HOSPITAL_BASED_OUTPATIENT_CLINIC_OR_DEPARTMENT_OTHER): Payer: Self-pay | Admitting: Emergency Medicine

## 2013-10-11 DIAGNOSIS — J45909 Unspecified asthma, uncomplicated: Secondary | ICD-10-CM | POA: Insufficient documentation

## 2013-10-11 DIAGNOSIS — E785 Hyperlipidemia, unspecified: Secondary | ICD-10-CM | POA: Diagnosis not present

## 2013-10-11 DIAGNOSIS — Z8711 Personal history of peptic ulcer disease: Secondary | ICD-10-CM | POA: Insufficient documentation

## 2013-10-11 DIAGNOSIS — F329 Major depressive disorder, single episode, unspecified: Secondary | ICD-10-CM | POA: Diagnosis not present

## 2013-10-11 DIAGNOSIS — I10 Essential (primary) hypertension: Secondary | ICD-10-CM | POA: Diagnosis not present

## 2013-10-11 DIAGNOSIS — Y9389 Activity, other specified: Secondary | ICD-10-CM | POA: Insufficient documentation

## 2013-10-11 DIAGNOSIS — K219 Gastro-esophageal reflux disease without esophagitis: Secondary | ICD-10-CM | POA: Insufficient documentation

## 2013-10-11 DIAGNOSIS — E039 Hypothyroidism, unspecified: Secondary | ICD-10-CM | POA: Insufficient documentation

## 2013-10-11 DIAGNOSIS — Z79899 Other long term (current) drug therapy: Secondary | ICD-10-CM | POA: Insufficient documentation

## 2013-10-11 DIAGNOSIS — S92911A Unspecified fracture of right toe(s), initial encounter for closed fracture: Secondary | ICD-10-CM

## 2013-10-11 DIAGNOSIS — S92919A Unspecified fracture of unspecified toe(s), initial encounter for closed fracture: Secondary | ICD-10-CM | POA: Insufficient documentation

## 2013-10-11 DIAGNOSIS — Z7982 Long term (current) use of aspirin: Secondary | ICD-10-CM | POA: Insufficient documentation

## 2013-10-11 DIAGNOSIS — M1990 Unspecified osteoarthritis, unspecified site: Secondary | ICD-10-CM | POA: Diagnosis not present

## 2013-10-11 DIAGNOSIS — F3289 Other specified depressive episodes: Secondary | ICD-10-CM | POA: Insufficient documentation

## 2013-10-11 DIAGNOSIS — IMO0002 Reserved for concepts with insufficient information to code with codable children: Secondary | ICD-10-CM | POA: Insufficient documentation

## 2013-10-11 DIAGNOSIS — Y929 Unspecified place or not applicable: Secondary | ICD-10-CM | POA: Insufficient documentation

## 2013-10-11 NOTE — ED Provider Notes (Signed)
CSN: [id number]     Arrival date & time 10/11/13  1411 History   First MD Initiated Contact with Patient 10/11/13 1600     Chief Complaint  Patient presents with  . Toe Injury     (Consider location/radiation/quality/duration/timing/severity/associated sxs/prior Treatment) Patient is a 68 y.o. female presenting with toe pain. The history is provided by the patient. No language interpreter was used.  Toe Pain This is a new problem. The current episode started today. The problem occurs constantly. The problem has been gradually worsening. Associated symptoms include joint swelling and myalgias. Pertinent negatives include no numbness. Nothing aggravates the symptoms. She has tried nothing for the symptoms. The treatment provided mild relief.  Pt hit bed with her foot.  Pt complains of pain in the space between 4th and 4th toe.    Past Medical History  Diagnosis Date  . GERD (gastroesophageal reflux disease)   . Hyperlipidemia   . Hypertension   . Depression   . Fibromyalgia   . Peptic ulcer disease   . Hypothyroidism   . Low back pain   . Osteoarthritis   . Asthma   . Diverticular disease   . Complication of anesthesia     had bronc spasms during intubation surgery 2009 on foot-need albuterol inhaler or neb tx preop   Past Surgical History  Procedure Laterality Date  . Cholecystectomy    . Abdominal hysterectomy    . Tonsillectomy    . Shoulder surgery  2009,2011    rt and lt  . Colonoscopy    . Ankle fracture surgery  2009    rt   . Ankle arthroscopy  01/02/2012    Procedure: ANKLE ARTHROSCOPY;  Surgeon: Colin Rhein, MD;  Location: [facility];  Service: Orthopedics;  Laterality: Right;  right ankle arthroscopy with extensive debridement , dridement and drilling talar dome osteochondral lesion   Family History  Problem Relation Age of Onset  . Dementia Mother   . Dementia Father   . Alcohol abuse Father   . Cancer Sister     breast cancer  . Alcohol  abuse Brother    History  Substance Use Topics  . Smoking status: Never Smoker   . Smokeless tobacco: Not on file  . Alcohol Use: No   OB History   Grav Para Term Preterm Abortions TAB SAB Ect Mult Living                 Review of Systems  Musculoskeletal: Positive for joint swelling and myalgias.  Skin: Positive for wound.  Neurological: Negative for numbness.  All other systems reviewed and are negative.     Allergies  Dilaudid; Adhesive; Codeine; Dilaudid; and Lactose intolerance (gi)  Home Medications   Prior to Admission medications   Medication Sig Start Date End Date Taking? Authorizing Provider  albuterol (PROAIR HFA) 108 (90 BASE) MCG/ACT inhaler Inhale 2 puffs into the lungs every 6 (six) hours as needed for wheezing. 07/14/12   Midge Minium, MD  ALPRAZolam Duanne Moron) 0.5 MG tablet TAKE 2 TABLETS BY MOUTH TWICE A DAY AS NEEDED AT 10 AM AND 5 PM 10/07/13   Midge Minium, MD  amLODipine-valsartan (EXFORGE) 5-160 MG per tablet TAKE 1 TABLET BY MOUTH DAILY. 07/07/13   Midge Minium, MD  aspirin 81 MG tablet Take 81 mg by mouth daily.    Historical Provider, MD  atorvastatin (LIPITOR) 20 MG tablet TAKE 1 TABLET EVERY DAY 07/15/13   Midge Minium,  MD  buPROPion (WELLBUTRIN XL) 300 MG 24 hr tablet TAKE 1 TABLET BY MOUTH EVERY DAY 10/07/13   Midge Minium, MD  citalopram (CELEXA) 20 MG tablet Take 1 tablet (20 mg total) by mouth daily. 01/16/13   Midge Minium, MD  clonazePAM (KLONOPIN) 0.5 MG tablet TAKE 1 TABLET BY MOUTH AT BEDTIME AS NEEDED    Midge Minium, MD  cyclobenzaprine (FLEXERIL) 5 MG tablet Take 5 mg by mouth 2 (two) times daily as needed. For pain    Historical Provider, MD  Hydrocodone-Acetaminophen 5-300 MG TABS Take 1 tablet by mouth every 6 (six) hours as needed.    Historical Provider, MD  levothyroxine (SYNTHROID, LEVOTHROID) 50 MCG tablet TAKE 1 TABLET (50 MCG TOTAL) BY MOUTH DAILY.    Midge Minium, MD  meclizine (ANTIVERT)  25 MG tablet Take 1 tablet (25 mg total) by mouth 3 (three) times daily as needed. For vertigo 05/12/12   Midge Minium, MD  metoprolol tartrate (LOPRESSOR) 25 MG tablet TAKE 1 TABLET (25 MG TOTAL) BY MOUTH 2 (TWO) TIMES DAILY.    Midge Minium, MD  nitroGLYCERIN (NITROSTAT) 0.4 MG SL tablet Place 1 tablet (0.4 mg total) under the tongue every 5 (five) minutes as needed. As needed for chest pain 05/12/12   Midge Minium, MD  ranitidine (ZANTAC) 150 MG tablet Take 150 mg by mouth 2 (two) times daily.    Historical Provider, MD   BP 146/85  Pulse 87  Temp(Src) 97.6 F (36.4 C) (Oral)  Resp 18  Ht 5\' 5"  (1.651 m)  Wt 199 lb (90.266 kg)  BMI 33.12 kg/m2  SpO2 97% Physical Exam  Vitals reviewed. Constitutional: She appears well-developed and well-nourished.  HENT:  Head: Normocephalic.  Musculoskeletal: She exhibits tenderness.  Neurological: She is alert.  Skin: Skin is warm.  Psychiatric: She has a normal mood and affect.    ED Course  Procedures (including critical care time) Labs Review Labs Reviewed - No data to display  Imaging Review Dg Foot Complete Left  10/11/2013   CLINICAL DATA:  Injured foot.  EXAM: LEFT FOOT - COMPLETE 3+ VIEW  COMPARISON:  None.  FINDINGS: The joint spaces are maintained. There is an oblique coursing in minimally displaced fourth proximal phalanx shaft fracture. No involvement of the joints. No other acute bony findings. Calcaneal spurring changes are noted.  IMPRESSION: Mildly displaced fourth proximal phalanx shaft fracture.   Electronically Signed   By: Kalman Jewels M.D.   On: 10/11/2013 16:55     EKG Interpretation None      MDM   Final diagnoses:  Fracture of phalanx of toe, right, closed, initial encounter    4th toe mildly displaced 4th prox phalanx.  Buddy tape post op [place], [place] 10/11/13 1745

## 2013-10-11 NOTE — Discharge Instructions (Signed)

## 2013-10-11 NOTE — ED Notes (Signed)
Pt reports hitting her left 4th and 5th toe on bed.  Reports some pain. Sts she has a "bubble" on 3rd toe.

## 2013-10-12 NOTE — ED Provider Notes (Signed)
Medical screening examination/treatment/procedure(s) were performed by non-physician practitioner and as supervising physician I was immediately available for consultation/collaboration.  Neta Ehlers, MD 10/12/13 2026

## 2013-10-19 DIAGNOSIS — S92919A Unspecified fracture of unspecified toe(s), initial encounter for closed fracture: Secondary | ICD-10-CM | POA: Diagnosis not present

## 2013-10-28 ENCOUNTER — Telehealth: Payer: Self-pay | Admitting: *Deleted

## 2013-10-28 NOTE — Telephone Encounter (Signed)
Labs printed and faxed as requested.  

## 2013-10-28 NOTE — Telephone Encounter (Signed)
Caller name:  Zainab Relation to pt:  self Call back number: [phone]  Pharmacy:  Reason for call:   Pt called to request last lab results be sent to Dr. [name], her cardiologist.  Please mail to Dr at [address], [place], [place], [place] [address].  She has appt 11/04/2013 and would like the results to be at that office.

## 2013-11-04 DIAGNOSIS — IMO0001 Reserved for inherently not codable concepts without codable children: Secondary | ICD-10-CM | POA: Diagnosis not present

## 2013-11-04 DIAGNOSIS — I2510 Atherosclerotic heart disease of native coronary artery without angina pectoris: Secondary | ICD-10-CM | POA: Diagnosis not present

## 2013-11-04 DIAGNOSIS — I10 Essential (primary) hypertension: Secondary | ICD-10-CM | POA: Diagnosis not present

## 2013-11-04 DIAGNOSIS — E039 Hypothyroidism, unspecified: Secondary | ICD-10-CM | POA: Diagnosis not present

## 2013-11-04 DIAGNOSIS — E669 Obesity, unspecified: Secondary | ICD-10-CM | POA: Diagnosis not present

## 2013-11-04 DIAGNOSIS — E785 Hyperlipidemia, unspecified: Secondary | ICD-10-CM | POA: Diagnosis not present

## 2013-11-10 ENCOUNTER — Emergency Department (HOSPITAL_BASED_OUTPATIENT_CLINIC_OR_DEPARTMENT_OTHER)
Admission: EM | Admit: 2013-11-10 | Discharge: 2013-11-10 | Disposition: A | Discharge status: Home / Self Care | Payer: Medicare Other | Attending: Emergency Medicine | Admitting: Emergency Medicine

## 2013-11-10 ENCOUNTER — Emergency Department (HOSPITAL_BASED_OUTPATIENT_CLINIC_OR_DEPARTMENT_OTHER): Payer: Medicare Other

## 2013-11-10 ENCOUNTER — Encounter (HOSPITAL_BASED_OUTPATIENT_CLINIC_OR_DEPARTMENT_OTHER): Payer: Self-pay | Admitting: Emergency Medicine

## 2013-11-10 DIAGNOSIS — M549 Dorsalgia, unspecified: Secondary | ICD-10-CM | POA: Insufficient documentation

## 2013-11-10 DIAGNOSIS — M1990 Unspecified osteoarthritis, unspecified site: Secondary | ICD-10-CM | POA: Insufficient documentation

## 2013-11-10 DIAGNOSIS — E785 Hyperlipidemia, unspecified: Secondary | ICD-10-CM | POA: Diagnosis not present

## 2013-11-10 DIAGNOSIS — F329 Major depressive disorder, single episode, unspecified: Secondary | ICD-10-CM | POA: Insufficient documentation

## 2013-11-10 DIAGNOSIS — M545 Low back pain, unspecified: Secondary | ICD-10-CM | POA: Diagnosis not present

## 2013-11-10 DIAGNOSIS — I10 Essential (primary) hypertension: Secondary | ICD-10-CM | POA: Diagnosis not present

## 2013-11-10 DIAGNOSIS — Z79899 Other long term (current) drug therapy: Secondary | ICD-10-CM | POA: Diagnosis not present

## 2013-11-10 DIAGNOSIS — M47817 Spondylosis without myelopathy or radiculopathy, lumbosacral region: Secondary | ICD-10-CM | POA: Diagnosis not present

## 2013-11-10 DIAGNOSIS — F3289 Other specified depressive episodes: Secondary | ICD-10-CM | POA: Insufficient documentation

## 2013-11-10 DIAGNOSIS — J45909 Unspecified asthma, uncomplicated: Secondary | ICD-10-CM | POA: Diagnosis not present

## 2013-11-10 DIAGNOSIS — M4310 Spondylolisthesis, site unspecified: Secondary | ICD-10-CM | POA: Diagnosis not present

## 2013-11-10 DIAGNOSIS — K219 Gastro-esophageal reflux disease without esophagitis: Secondary | ICD-10-CM | POA: Insufficient documentation

## 2013-11-10 DIAGNOSIS — E039 Hypothyroidism, unspecified: Secondary | ICD-10-CM | POA: Diagnosis not present

## 2013-11-10 MED ORDER — METAXALONE 800 MG PO TABS: 800.0000 mg | ORAL_TABLET | Freq: Three times a day (TID) | ORAL | Status: DC

## 2013-11-10 MED ORDER — OXYCODONE-ACETAMINOPHEN 5-325 MG PO TABS: 1.0000 | ORAL_TABLET | Freq: Four times a day (QID) | ORAL | Status: DC | PRN

## 2013-11-10 NOTE — Discharge Instructions (Signed)
Return to the ED with any concerns including weakness of legs, not able to urinate, loss of control of bowel or bladder, decreased level of alertness/lethargy, or any other alarming symptoms °

## 2013-11-10 NOTE — ED Provider Notes (Signed)
CSN: [id number]     Arrival date & time 11/10/13  1839 History   First MD Initiated Contact with Patient 11/10/13 1951     This chart was scribed for Threasa Beards, MD by Forrestine Him, ED Scribe. This patient was seen in room MH09/MH09 and the patient's care was started 8:16 PM.   Chief Complaint  Patient presents with  . Back Pain   The history is provided by the patient. No language interpreter was used.    HPI Comments: Tracey Evans is a 68 y.o. female with a PMHx of GERD, hyperlipidemia, HTN, fibromyalgia, and osteoarthritis who presents to the Emergency Department complaining of constant, moderate mid back pain x 3 days that is unchanged. Pt reports a "pulling" and "spasm" sensation" in her back after lifting a heavy object from her trunk. This pain is exacerbated with certain movements. She has tried OTC Aleve without any improvement for symptoms. She has also tried prescribed Flexeril,. Hydrocodone, and Meloxicam with no relief. However, pt states heat application temporary helps her discomfort. At this time she denies any fever. Denies weakness, loss of bowel/bladder function or saddle anesthesia. Pt with known allergies to Dilaudid, adhesive, and codeine. She has no other pertinent past medical history. No other concerns this visit.   Past Medical History  Diagnosis Date  . GERD (gastroesophageal reflux disease)   . Hyperlipidemia   . Hypertension   . Depression   . Fibromyalgia   . Peptic ulcer disease   . Hypothyroidism   . Low back pain   . Osteoarthritis   . Asthma   . Diverticular disease   . Complication of anesthesia     had bronc spasms during intubation surgery 2009 on foot-need albuterol inhaler or neb tx preop   Past Surgical History  Procedure Laterality Date  . Cholecystectomy    . Abdominal hysterectomy    . Tonsillectomy    . Shoulder surgery  2009,2011    rt and lt  . Colonoscopy    . Ankle fracture surgery  2009    rt   . Ankle arthroscopy   01/02/2012    Procedure: ANKLE ARTHROSCOPY;  Surgeon: Colin Rhein, MD;  Location: [facility];  Service: Orthopedics;  Laterality: Right;  right ankle arthroscopy with extensive debridement , dridement and drilling talar dome osteochondral lesion   Family History  Problem Relation Age of Onset  . Dementia Mother   . Dementia Father   . Alcohol abuse Father   . Cancer Sister     breast cancer  . Alcohol abuse Brother    History  Substance Use Topics  . Smoking status: Never Smoker   . Smokeless tobacco: Not on file  . Alcohol Use: No   OB History   Grav Para Term Preterm Abortions TAB SAB Ect Mult Living                 Review of Systems  Constitutional: Negative for fever.  Musculoskeletal: Positive for back pain.  Neurological: Negative for weakness.  All other systems reviewed and are negative.     Allergies  Dilaudid; Adhesive; Codeine; Dilaudid; and Lactose intolerance (gi)  Home Medications   Prior to Admission medications   Medication Sig Start Date End Date Taking? Authorizing Provider  albuterol (PROAIR HFA) 108 (90 BASE) MCG/ACT inhaler Inhale 2 puffs into the lungs every 6 (six) hours as needed for wheezing. 07/14/12   Midge Minium, MD  ALPRAZolam Duanne Moron) 0.5 MG tablet  TAKE 2 TABLETS BY MOUTH TWICE A DAY AS NEEDED AT 10 AM AND 5 PM 10/07/13   Midge Minium, MD  amLODipine-valsartan (EXFORGE) 5-160 MG per tablet TAKE 1 TABLET BY MOUTH DAILY. 07/07/13   Midge Minium, MD  aspirin 81 MG tablet Take 81 mg by mouth daily.    Historical Provider, MD  atorvastatin (LIPITOR) 20 MG tablet TAKE 1 TABLET EVERY DAY 07/15/13   Midge Minium, MD  buPROPion (WELLBUTRIN XL) 300 MG 24 hr tablet TAKE 1 TABLET BY MOUTH EVERY DAY 10/07/13   Midge Minium, MD  citalopram (CELEXA) 20 MG tablet Take 1 tablet (20 mg total) by mouth daily. 01/16/13   Midge Minium, MD  clonazePAM (KLONOPIN) 0.5 MG tablet TAKE 1 TABLET BY MOUTH AT BEDTIME AS NEEDED     Midge Minium, MD  cyclobenzaprine (FLEXERIL) 5 MG tablet Take 5 mg by mouth 2 (two) times daily as needed. For pain    Historical Provider, MD  Hydrocodone-Acetaminophen 5-300 MG TABS Take 1 tablet by mouth every 6 (six) hours as needed.    Historical Provider, MD  levothyroxine (SYNTHROID, LEVOTHROID) 50 MCG tablet TAKE 1 TABLET (50 MCG TOTAL) BY MOUTH DAILY.    Midge Minium, MD  meclizine (ANTIVERT) 25 MG tablet Take 1 tablet (25 mg total) by mouth 3 (three) times daily as needed. For vertigo 05/12/12   Midge Minium, MD  metaxalone (SKELAXIN) 800 MG tablet Take 1 tablet (800 mg total) by mouth 3 (three) times daily. 11/10/13   Threasa Beards, MD  metoprolol tartrate (LOPRESSOR) 25 MG tablet TAKE 1 TABLET (25 MG TOTAL) BY MOUTH 2 (TWO) TIMES DAILY.    Midge Minium, MD  nitroGLYCERIN (NITROSTAT) 0.4 MG SL tablet Place 1 tablet (0.4 mg total) under the tongue every 5 (five) minutes as needed. As needed for chest pain 05/12/12   Midge Minium, MD  oxyCODONE-acetaminophen (PERCOCET/ROXICET) 5-325 MG per tablet Take 1-2 tablets by mouth every 6 (six) hours as needed for severe pain. 11/10/13   Threasa Beards, MD  ranitidine (ZANTAC) 150 MG tablet Take 150 mg by mouth 2 (two) times daily.    Historical Provider, MD   Triage Vitals: BP 142/92  Pulse 68  Temp(Src) 98.1 F (36.7 C) (Oral)  Resp 16  Ht 5\' 5"  (1.651 m)  Wt 198 lb (89.812 kg)  BMI 32.95 kg/m2  SpO2 99%   Physical Exam  Nursing note and vitals reviewed. Constitutional: She is oriented to person, place, and time. She appears well-developed and well-nourished.  HENT:  Head: Normocephalic.  Eyes: EOM are normal.  Neck: Normal range of motion.  Pulmonary/Chest: Effort normal.  Abdominal: She exhibits no distension.  Musculoskeletal: Normal range of motion.  Neurological: She is alert and oriented to person, place, and time.  Psychiatric: She has a normal mood and affect.  Note- mild midline tenderness to  palpation over lumbar spine region, no CVA tenderness, no paraspinal tenderness to palpation, but this is where patient feels the pulling sensation in her back.  Neuro- sensation distally intact, strength 5/5 in extremities x 4  ED Course  Procedures (including critical care time)  DIAGNOSTIC STUDIES: Oxygen Saturation is 99% on RA, Normal by my interpretation.    COORDINATION OF CARE: 8:20 PM- Will order DG lumbar spine complete. Discussed treatment plan with pt at bedside and pt agreed to plan.     Labs Review Labs Reviewed - No data to display  Imaging  Review Dg Lumbar Spine Complete  11/10/2013   CLINICAL DATA:  Back pain.  No indication of trauma.  EXAM: LUMBAR SPINE - COMPLETE 4+ VIEW  COMPARISON:  Abdominal CT 01/21/2012  FINDINGS: No acute fracture, endplate erosion, or evidence of bone lesion.  Diffuse spondylotic endplate spurring without focal disc narrowing or progression from 2013. Mild anterolisthesis at L4-5, associated with advanced facet osteoarthritis. Hypertrophic facet degeneration also noted at L3-4 and L5-S1.  IMPRESSION: 1. No acute osseous findings. 2. Lower lumbar facet osteoarthritis with mild L4-5 anterolisthesis.   Electronically Signed   By: Jorje Guild M.D.   On: 11/10/2013 21:19     EKG Interpretation None      MDM   Final diagnoses:  Bilateral low back pain without sciatica    Pt presenting with back pain after lifting a table several days ago.  Pain began gradually after the lifting.  Pt has tried hydrocodone, meloxicam, flexeril without relief.  She has no signs or symptoms of cauda equina.  Xray reassuring.  Pt given rx for percocet and skelaxin.  She was advised to arrange for close followup with her PMD.  Discharged with strict return precautions.  Pt agreeable with plan.  I personally performed the services described in this documentation, which was scribed in my presence. The recorded information has been reviewed and is accurate.    Threasa Beards, MD 11/12/13 [phone]

## 2013-11-10 NOTE — ED Notes (Signed)
Pt says she started having pain in her mid back area on Saturday. Pt says she feels a "pulling" and "spasm" sensation in her back

## 2013-11-13 DIAGNOSIS — Z5189 Encounter for other specified aftercare: Secondary | ICD-10-CM | POA: Diagnosis not present

## 2013-12-01 ENCOUNTER — Encounter: Payer: Self-pay | Admitting: Medical

## 2013-12-01 ENCOUNTER — Ambulatory Visit (INDEPENDENT_AMBULATORY_CARE_PROVIDER_SITE_OTHER): Payer: Medicare Other | Admitting: Medical

## 2013-12-01 VITALS — BP 120/70 | HR 67 | Temp 98.2°F | Wt 195.0 lb

## 2013-12-01 DIAGNOSIS — R109 Unspecified abdominal pain: Secondary | ICD-10-CM | POA: Insufficient documentation

## 2013-12-01 DIAGNOSIS — K5732 Diverticulitis of large intestine without perforation or abscess without bleeding: Secondary | ICD-10-CM | POA: Insufficient documentation

## 2013-12-01 DIAGNOSIS — N390 Urinary tract infection, site not specified: Secondary | ICD-10-CM | POA: Insufficient documentation

## 2013-12-01 LAB — CBC WITH DIFFERENTIAL/PLATELET
Basophils Absolute: 0 10*3/uL (ref 0.0–0.1)
Basophils Relative: 0.6 % (ref 0.0–3.0)
EOS PCT: 3.7 % (ref 0.0–5.0)
Eosinophils Absolute: 0.3 10*3/uL (ref 0.0–0.7)
HEMATOCRIT: 37 % (ref 36.0–46.0)
HEMOGLOBIN: 12.1 g/dL (ref 12.0–15.0)
LYMPHS ABS: 3.3 10*3/uL (ref 0.7–4.0)
LYMPHS PCT: 40.9 % (ref 12.0–46.0)
MCHC: 32.8 g/dL (ref 30.0–36.0)
MCV: 92.8 fl (ref 78.0–100.0)
MONOS PCT: 7.5 % (ref 3.0–12.0)
Monocytes Absolute: 0.6 10*3/uL (ref 0.1–1.0)
NEUTROS ABS: 3.8 10*3/uL (ref 1.4–7.7)
Neutrophils Relative %: 47.3 % (ref 43.0–77.0)
Platelets: 257 10*3/uL (ref 150.0–400.0)
RBC: 3.99 Mil/uL (ref 3.87–5.11)
RDW: 14.6 % (ref 11.5–15.5)
WBC: 8 10*3/uL (ref 4.0–10.5)

## 2013-12-01 LAB — POCT URINALYSIS DIPSTICK
BILIRUBIN UA: NEGATIVE
Glucose, UA: NEGATIVE
KETONES UA: NEGATIVE
NITRITE UA: NEGATIVE
Spec Grav, UA: 1.02
Urobilinogen, UA: 0.2
pH, UA: 6

## 2013-12-01 MED ORDER — CIPROFLOXACIN HCL 500 MG PO TABS: 500.0000 mg | ORAL_TABLET | Freq: Two times a day (BID) | ORAL | Status: DC

## 2013-12-01 MED ORDER — METRONIDAZOLE 500 MG PO TABS: 500.0000 mg | ORAL_TABLET | Freq: Three times a day (TID) | ORAL | Status: DC

## 2013-12-01 MED ORDER — SULFAMETHOXAZOLE-TMP DS 800-160 MG PO TABS: 1.0000 | ORAL_TABLET | Freq: Two times a day (BID) | ORAL | Status: DC

## 2013-12-01 NOTE — Assessment & Plan Note (Signed)
Suprapubic region pain with scant blood on hemmocult testing. Know diverticulosis. Hx of diverticulitis 2 years ago. Will rx bactrim and flagyl Repeat hemocult test kit x 3(7 days). Get cbc today check hb/hct and wbc.

## 2013-12-01 NOTE — Progress Notes (Signed)
Pre visit review using our clinic review tool, if applicable. No additional management support is needed unless otherwise documented below in the visit note. 

## 2013-12-01 NOTE — Assessment & Plan Note (Signed)
Urine culture sent out. Rx of bactrim ds.

## 2013-12-01 NOTE — Progress Notes (Signed)
   Subjective:    Patient ID: Tracey Evans, female    DOB: 01-05-1946, 68 y.o.   MRN: [id number]  HPI   Pt in with some recent bloating of abdomen. Pt did have loose stool 2 days(But no recent constant diarrhea). Pt has ibs so she states that is not unusual. Pt stomach mild tender. Yesterday she had pain lower quadrant. No fevers, no chills, on nausea or vomiting. No dark stools or black stools 2 days ago. Pt still has ovaries. Pt does not take any meds for ibs. She is also lactose intolerant. Pt has diverticulosis and 2 years ago some diverticulitis. Pain yesterday in lower abdomen was moderate to severe. Today a lot less.   Review of Systems  Constitutional: Negative for fever, chills and fatigue.  Respiratory: Negative for cough, shortness of breath and wheezing.   Cardiovascular: Negative for chest pain and palpitations.  Gastrointestinal: Positive for abdominal pain and diarrhea. Negative for nausea, vomiting, constipation, blood in stool, anal bleeding and rectal pain.       2 days ago had loose stool.  Genitourinary: Negative.   Musculoskeletal: Positive for back pain. Negative for joint swelling, myalgias, neck pain and neck stiffness.       Chronic diffuse back pain.(Fibromalgyia.  Hematological: Negative for adenopathy. Does not bruise/bleed easily.       Objective:   Physical Exam General Appearance- Not in acute distress.  HEENT Eyes- Scleraeral/Conjuntiva-bilat- Not Yellow. Mouth & Throat- Normal.  Chest and Lung Exam Auscultation: Breath sounds:-Normal. Adventitious sounds:- No Adventitious sounds.  Cardiovascular Auscultation:Rythm - Regular. Heart Sounds -Normal heart sounds.  Abdomen Inspection:-Inspection Normal.  Palpation/Perucssion: Palpation- faint suprapubic Tender, no llq tender, No Rebound tenderness, No rigidity(Guarding) and No Palpable abdominal masses.  Liver:-Normal.  Spleen:- Normal.   Rectal Anorectal Exam: Stool - Hemoccult of  stool/mucous faint positive on testing today(only in one box(Stool sample poor). External - normal external exam. Internal - normal sphincter tone. No rectal mass.          Assessment & Plan:

## 2013-12-01 NOTE — Patient Instructions (Addendum)
For your abdominal pain with loose stool(scant positive blood on card), I will give your flagyl and bactrim ds for diverticulitis.Your urine also looks suspicious for infection. Urine culture is pending. I do want you to do 3 card hemoccult test today in one week. Pick up kit today when you get lab/cbc. Follow up in 1-2 weeks or as needed.

## 2013-12-03 LAB — URINE CULTURE: Colony Count: 10000

## 2013-12-07 DIAGNOSIS — IMO0002 Reserved for concepts with insufficient information to code with codable children: Secondary | ICD-10-CM | POA: Diagnosis not present

## 2013-12-07 DIAGNOSIS — M542 Cervicalgia: Secondary | ICD-10-CM | POA: Diagnosis not present

## 2013-12-16 DIAGNOSIS — M545 Low back pain, unspecified: Secondary | ICD-10-CM | POA: Diagnosis not present

## 2013-12-21 ENCOUNTER — Other Ambulatory Visit: Payer: Self-pay | Admitting: Family Medicine

## 2013-12-21 NOTE — Telephone Encounter (Signed)
Med filled.  

## 2013-12-31 ENCOUNTER — Emergency Department (HOSPITAL_BASED_OUTPATIENT_CLINIC_OR_DEPARTMENT_OTHER): Payer: Medicare Other

## 2013-12-31 ENCOUNTER — Encounter (HOSPITAL_BASED_OUTPATIENT_CLINIC_OR_DEPARTMENT_OTHER): Payer: Self-pay | Admitting: Emergency Medicine

## 2013-12-31 ENCOUNTER — Emergency Department (HOSPITAL_BASED_OUTPATIENT_CLINIC_OR_DEPARTMENT_OTHER)
Admission: EM | Admit: 2013-12-31 | Discharge: 2013-12-31 | Disposition: A | Discharge status: Home / Self Care | Payer: Medicare Other | Attending: Emergency Medicine | Admitting: Emergency Medicine

## 2013-12-31 DIAGNOSIS — K219 Gastro-esophageal reflux disease without esophagitis: Secondary | ICD-10-CM | POA: Insufficient documentation

## 2013-12-31 DIAGNOSIS — M1990 Unspecified osteoarthritis, unspecified site: Secondary | ICD-10-CM | POA: Diagnosis not present

## 2013-12-31 DIAGNOSIS — R079 Chest pain, unspecified: Secondary | ICD-10-CM | POA: Insufficient documentation

## 2013-12-31 DIAGNOSIS — E785 Hyperlipidemia, unspecified: Secondary | ICD-10-CM | POA: Diagnosis not present

## 2013-12-31 DIAGNOSIS — Z792 Long term (current) use of antibiotics: Secondary | ICD-10-CM | POA: Insufficient documentation

## 2013-12-31 DIAGNOSIS — IMO0002 Reserved for concepts with insufficient information to code with codable children: Secondary | ICD-10-CM | POA: Diagnosis not present

## 2013-12-31 DIAGNOSIS — Z7982 Long term (current) use of aspirin: Secondary | ICD-10-CM | POA: Insufficient documentation

## 2013-12-31 DIAGNOSIS — F3289 Other specified depressive episodes: Secondary | ICD-10-CM | POA: Insufficient documentation

## 2013-12-31 DIAGNOSIS — F329 Major depressive disorder, single episode, unspecified: Secondary | ICD-10-CM | POA: Diagnosis not present

## 2013-12-31 DIAGNOSIS — E039 Hypothyroidism, unspecified: Secondary | ICD-10-CM | POA: Insufficient documentation

## 2013-12-31 DIAGNOSIS — I10 Essential (primary) hypertension: Secondary | ICD-10-CM | POA: Insufficient documentation

## 2013-12-31 DIAGNOSIS — Z79899 Other long term (current) drug therapy: Secondary | ICD-10-CM | POA: Diagnosis not present

## 2013-12-31 DIAGNOSIS — R071 Chest pain on breathing: Secondary | ICD-10-CM | POA: Insufficient documentation

## 2013-12-31 DIAGNOSIS — J45909 Unspecified asthma, uncomplicated: Secondary | ICD-10-CM | POA: Insufficient documentation

## 2013-12-31 DIAGNOSIS — R0789 Other chest pain: Secondary | ICD-10-CM

## 2013-12-31 LAB — BASIC METABOLIC PANEL
ANION GAP: 13 (ref 5–15)
BUN: 14 mg/dL (ref 6–23)
CALCIUM: 9.5 mg/dL (ref 8.4–10.5)
CO2: 27 meq/L (ref 19–32)
Chloride: 104 mEq/L (ref 96–112)
Creatinine, Ser: 1 mg/dL (ref 0.50–1.10)
GFR calc Af Amer: 66 mL/min — ABNORMAL LOW (ref >90)
GFR calc non Af Amer: 57 mL/min — ABNORMAL LOW (ref >90)
Glucose, Bld: 104 mg/dL — ABNORMAL HIGH (ref 70–99)
Potassium: 4.4 mEq/L (ref 3.7–5.3)
SODIUM: 144 meq/L (ref 137–147)

## 2013-12-31 LAB — D-DIMER, QUANTITATIVE: D-Dimer, Quant: 0.31 ug/mL-FEU (ref 0.00–0.48)

## 2013-12-31 LAB — TROPONIN I: Troponin I: <0.3 ng/mL (ref <0.30)

## 2013-12-31 LAB — CBC
HCT: 41.9 % (ref 36.0–46.0)
Hemoglobin: 14 g/dL (ref 12.0–15.0)
MCH: 30.9 pg (ref 26.0–34.0)
MCHC: 33.4 g/dL (ref 30.0–36.0)
MCV: 92.5 fL (ref 78.0–100.0)
PLATELETS: 314 10*3/uL (ref 150–400)
RBC: 4.53 MIL/uL (ref 3.87–5.11)
RDW: 14.2 % (ref 11.5–15.5)
WBC: 9.7 10*3/uL (ref 4.0–10.5)

## 2013-12-31 MED ORDER — OXYCODONE-ACETAMINOPHEN 5-325 MG PO TABS: 1.0000 | ORAL_TABLET | Freq: Four times a day (QID) | ORAL | Status: DC | PRN

## 2013-12-31 MED ORDER — ONDANSETRON HCL 8 MG PO TABS: 8.0000 mg | ORAL_TABLET | Freq: Three times a day (TID) | ORAL | Status: DC | PRN

## 2013-12-31 NOTE — Discharge Instructions (Signed)
Chest Wall Pain Do not take the medication prescribed with either Xanax(alprazolam) or Klonezepam (klonipin) as the combination can be dangerous. Take Tylenol for mild pain or the pain medicine prescribed for bad pain. Call Dr.Tabori to arrange to be seen in the office if not better in a week. Chest wall pain is pain felt in or around the chest bones and muscles. It may take up to 6 weeks to get better. It may take longer if you are active. Chest wall pain can happen on its own. Other times, things like germs, injury, coughing, or exercise can cause the pain. HOME CARE   Avoid activities that make you tired or cause pain. Try not to use your chest, belly (abdominal), or side muscles. Do not use heavy weights.  Put ice on the sore area.  Put ice in a plastic bag.  Place a towel between your skin and the bag.  Leave the ice on for 15-20 minutes for the first 2 days.  Only take medicine as told by your doctor. GET HELP RIGHT AWAY IF:   You have more pain or are very uncomfortable.  You have a fever.  Your chest pain gets worse.  You have new problems.  You feel sick to your stomach (nauseous) or throw up (vomit).  You start to sweat or feel lightheaded.  You have a cough with mucus (phlegm).  You cough up blood. MAKE SURE YOU:   Understand these instructions.  Will watch your condition.  Will get help right away if you are not doing well or get worse. Document Released: 09/12/2007 Document Revised: 06/18/2011 Document Reviewed: 11/20/2010 [facility] Patient Information 2015 [place], [place]. This information is not intended to replace advice given to you by your health care provider. Make sure you discuss any questions you have with your health care provider.

## 2013-12-31 NOTE — ED Notes (Signed)
Chest pain into her right chest x 3 days. Worse with deep breathing.

## 2013-12-31 NOTE — ED Provider Notes (Signed)
CSN: [id number]     Arrival date & time 12/31/13  1522 History   First MD Initiated Contact with Patient 12/31/13 1537     Chief Complaint  Patient presents with  . Chest Pain     (Consider location/radiation/quality/duration/timing/severity/associated sxs/prior Treatment) Patient is a 68 y.o. female presenting with chest pain.  Chest Pain  Complains of right anterior chest pain starting at sternal notch radiating around her right breast and back onset 3 days ago pain is constant worse with lying on her right side worse with deep inspiration or changing position. She denies shortness of breath pain does not feel like angina she's had in the past which is pressure over her left anterior chest radiating down her left arm. She has treated herself with oxycodone one tablet twice a day, without leaves. She denies shortness of breath denies nausea denies sweatiness denies fever denies cough Past Medical History  Diagnosis Date  . GERD (gastroesophageal reflux disease)   . Hyperlipidemia   . Hypertension   . Depression   . Fibromyalgia   . Peptic ulcer disease   . Hypothyroidism   . Low back pain   . Osteoarthritis   . Asthma   . Diverticular disease   . Complication of anesthesia     had bronc spasms during intubation surgery 2009 on foot-need albuterol inhaler or neb tx preop   Past Surgical History  Procedure Laterality Date  . Cholecystectomy    . Abdominal hysterectomy    . Tonsillectomy    . Shoulder surgery  2009,2011    rt and lt  . Colonoscopy    . Ankle fracture surgery  2009    rt   . Ankle arthroscopy  01/02/2012    Procedure: ANKLE ARTHROSCOPY;  Surgeon: Colin Rhein, MD;  Location: [facility];  Service: Orthopedics;  Laterality: Right;  right ankle arthroscopy with extensive debridement , dridement and drilling talar dome osteochondral lesion   Family History  Problem Relation Age of Onset  . Dementia Mother   . Dementia Father   . Alcohol abuse  Father   . Cancer Sister     breast cancer  . Alcohol abuse Brother    History  Substance Use Topics  . Smoking status: Never Smoker   . Smokeless tobacco: Not on file  . Alcohol Use: No   OB History   Grav Para Term Preterm Abortions TAB SAB Ect Mult Living                 Review of Systems  Constitutional: Negative.   HENT: Negative.   Respiratory: Negative.   Cardiovascular: Positive for chest pain.  Gastrointestinal: Negative.   Musculoskeletal: Negative.   Skin: Negative.   Neurological: Negative.   Psychiatric/Behavioral: Negative.   All other systems reviewed and are negative.     Allergies  Dilaudid; Adhesive; Codeine; Dilaudid; and Lactose intolerance (gi)  Home Medications   Prior to Admission medications   Medication Sig Start Date End Date Taking? Authorizing Provider  albuterol (PROAIR HFA) 108 (90 BASE) MCG/ACT inhaler Inhale 2 puffs into the lungs every 6 (six) hours as needed for wheezing. 07/14/12   Midge Minium, MD  ALPRAZolam Duanne Moron) 0.5 MG tablet TAKE 2 TABLETS BY MOUTH TWICE A DAY AS NEEDED AT 10 AM AND 5 PM 10/07/13   Midge Minium, MD  amLODipine-valsartan (EXFORGE) 5-160 MG per tablet TAKE 1 TABLET BY MOUTH DAILY. 07/07/13   Midge Minium, MD  aspirin 81  MG tablet Take 81 mg by mouth daily.    Historical Provider, MD  atorvastatin (LIPITOR) 20 MG tablet TAKE 1 TABLET EVERY DAY 07/15/13   Midge Minium, MD  buPROPion (WELLBUTRIN XL) 300 MG 24 hr tablet TAKE 1 TABLET BY MOUTH EVERY DAY 10/07/13   Midge Minium, MD  citalopram (CELEXA) 20 MG tablet TAKE 1 TABLET (20 MG TOTAL) BY MOUTH DAILY. 12/21/13   Midge Minium, MD  clonazePAM (KLONOPIN) 0.5 MG tablet TAKE 1 TABLET BY MOUTH AT BEDTIME AS NEEDED    Midge Minium, MD  cyclobenzaprine (FLEXERIL) 5 MG tablet Take 5 mg by mouth 2 (two) times daily as needed. For pain    Historical Provider, MD  Hydrocodone-Acetaminophen 5-300 MG TABS Take 1 tablet by mouth every 6 (six)  hours as needed.    Historical Provider, MD  levothyroxine (SYNTHROID, LEVOTHROID) 50 MCG tablet TAKE 1 TABLET (50 MCG TOTAL) BY MOUTH DAILY.    Midge Minium, MD  meclizine (ANTIVERT) 25 MG tablet Take 1 tablet (25 mg total) by mouth 3 (three) times daily as needed. For vertigo 05/12/12   Midge Minium, MD  metaxalone (SKELAXIN) 800 MG tablet Take 1 tablet (800 mg total) by mouth 3 (three) times daily. 11/10/13   Threasa Beards, MD  metoprolol tartrate (LOPRESSOR) 25 MG tablet TAKE 1 TABLET (25 MG TOTAL) BY MOUTH 2 (TWO) TIMES DAILY.    Midge Minium, MD  metroNIDAZOLE (FLAGYL) 500 MG tablet Take 1 tablet (500 mg total) by mouth 3 (three) times daily. 12/01/13   [place], PA-C  nitroGLYCERIN (NITROSTAT) 0.4 MG SL tablet Place 1 tablet (0.4 mg total) under the tongue every 5 (five) minutes as needed. As needed for chest pain 05/12/12   Midge Minium, MD  oxyCODONE-acetaminophen (PERCOCET/ROXICET) 5-325 MG per tablet Take 1-2 tablets by mouth every 6 (six) hours as needed for severe pain. 11/10/13   Threasa Beards, MD  ranitidine (ZANTAC) 150 MG tablet Take 150 mg by mouth 2 (two) times daily.    Historical Provider, MD  sulfamethoxazole-trimethoprim (BACTRIM DS) 800-160 MG per tablet Take 1 tablet by mouth 2 (two) times daily. 12/01/13   Meriam Sprague Saguier, PA-C   BP 114/81  Pulse 60  Temp(Src) 98 F (36.7 C) (Oral)  Resp 20  Ht 5\' 5"  (1.651 m)  Wt 195 lb (88.451 kg)  BMI 32.45 kg/m2  SpO2 100% Physical Exam  Nursing note and vitals reviewed. Constitutional: She appears well-developed and well-nourished.  HENT:  Head: Normocephalic and atraumatic.  Eyes: Conjunctivae are normal. Pupils are equal, round, and reactive to light.  Neck: Neck supple. No tracheal deviation present. No thyromegaly present.  Cardiovascular: Normal rate and regular rhythm.  Exam reveals no friction rub.   No murmur heard. Pulmonary/Chest: Effort normal and breath sounds normal. She exhibits no  tenderness.  Abdominal: Soft. Bowel sounds are normal. She exhibits no distension. There is no tenderness.  Musculoskeletal: Normal range of motion. She exhibits no edema and no tenderness.  Neurological: She is alert. Coordination normal.  Skin: Skin is warm and dry. No rash noted.  Psychiatric: She has a normal mood and affect.    ED Course  Procedures (including critical care time) Labs Review Labs Reviewed - No data to display  Imaging Review No results found.   EKG Interpretation   Date/Time:  Thursday December 31 2013 15:29:10 EDT Ventricular Rate:  67 PR Interval:  190 QRS Duration: 76 QT Interval:  404 QTC Calculation: 426 R Axis:   64 Text Interpretation:  Normal sinus rhythm Normal ECG No significant change  since last tracing Confirmed by Winfred Leeds  MD, Jezebel Pollet [id number]) on 12/31/2013  3:52:38 PM      Results for orders placed during the hospital encounter of 12/31/13  CBC      Result Value Ref Range   WBC 9.7  4.0 - 10.5 K/uL   RBC 4.53  3.87 - 5.11 MIL/uL   Hemoglobin 14.0  12.0 - 15.0 g/dL   HCT 41.9  36.0 - 46.0 %   MCV 92.5  78.0 - 100.0 fL   MCH 30.9  26.0 - 34.0 pg   MCHC 33.4  30.0 - 36.0 g/dL   RDW 14.2  11.5 - 15.5 %   Platelets 314  150 - 400 K/uL  BASIC METABOLIC PANEL      Result Value Ref Range   Sodium 144  137 - 147 mEq/L   Potassium 4.4  3.7 - 5.3 mEq/L   Chloride 104  96 - 112 mEq/L   CO2 27  19 - 32 mEq/L   Glucose, Bld 104 (*) 70 - 99 mg/dL   BUN 14  6 - 23 mg/dL   Creatinine, Ser 1.00  0.50 - 1.10 mg/dL   Calcium 9.5  8.4 - 10.5 mg/dL   GFR calc non Af Amer 57 (*) >90 mL/min   GFR calc Af Amer 66 (*) >90 mL/min   Anion gap 13  5 - 15  TROPONIN I      Result Value Ref Range   Troponin I <0.30  <0.30 ng/mL  D-DIMER, QUANTITATIVE      Result Value Ref Range   D-Dimer, Quant 0.31  0.00 - 0.48 ug/mL-FEU   Dg Chest 2 View  12/31/2013   CLINICAL DATA:  Chest pain.  EXAM: CHEST  2 VIEW  COMPARISON:  06/30/2012.  FINDINGS: Mediastinum  hilar structures normal. Lungs are clear. Heart size normal. No pleural effusion or pneumothorax. Degenerative changes thoracic spine. Surgical clips right upper quadrant.  IMPRESSION: No acute cardiopulmonary disease.   Electronically Signed   By: [place]   On: 12/31/2013 16:29    Declined pain medicine in the emergency dept. 5:40 PM patient resting comfortably. Chest x-ray viewed by me MDM  Strongly suspect musculoskeletal chest pain. Low pretest clinical probability for pulmonary embolism. Patient states she's been lifting boxes recently in an attempt to move.Doubbt ACS atypical sx , non acute ecg, neg troponin after 3 days Plan prescription Percocet, Zofran. Follow up wiith Dr. Wonda Horner prn 1 week.States opioid pain medicine caused her to be nauseated. She is instructed not to take Percocet with either Xanax or on Klonipin Final diagnoses:  None   Diagnosis chest wall pain     Orlie Dakin, MD 12/31/13 1750

## 2014-01-04 ENCOUNTER — Other Ambulatory Visit: Payer: Self-pay | Admitting: General Practice

## 2014-01-04 MED ORDER — AMLODIPINE BESYLATE-VALSARTAN 5-160 MG PO TABS: ORAL_TABLET | ORAL | Status: DC

## 2014-01-05 ENCOUNTER — Other Ambulatory Visit (INDEPENDENT_AMBULATORY_CARE_PROVIDER_SITE_OTHER): Payer: Medicare Other

## 2014-01-05 DIAGNOSIS — R109 Unspecified abdominal pain: Secondary | ICD-10-CM

## 2014-01-05 DIAGNOSIS — Z0000 Encounter for general adult medical examination without abnormal findings: Secondary | ICD-10-CM | POA: Diagnosis not present

## 2014-01-05 LAB — POC HEMOCCULT BLD/STL (HOME/3-CARD/SCREEN)
Card #2 Fecal Occult Blod, POC: NEGATIVE
FECAL OCCULT BLD: NEGATIVE
Fecal Occult Blood, POC: NEGATIVE

## 2014-01-05 NOTE — Addendum Note (Signed)
Addended by: Modena Morrow D on: 01/05/2014 04:48 PM   Modules accepted: Orders

## 2014-01-11 ENCOUNTER — Other Ambulatory Visit: Payer: Self-pay | Admitting: General Practice

## 2014-01-11 ENCOUNTER — Telehealth: Payer: Self-pay | Admitting: Medical

## 2014-01-11 MED ORDER — LEVOTHYROXINE SODIUM 50 MCG PO TABS: ORAL_TABLET | ORAL | Status: DC

## 2014-01-11 NOTE — Telephone Encounter (Signed)
Send lpn message. Asking her to advise pt to come in for recheck of her abdomen pain. May refer her to Gi.

## 2014-01-12 NOTE — Telephone Encounter (Signed)
Patient agrees to schedule appointment to come in for follow up visit.

## 2014-01-18 DIAGNOSIS — S239XXS Sprain of unspecified parts of thorax, sequela: Secondary | ICD-10-CM | POA: Diagnosis not present

## 2014-01-25 ENCOUNTER — Other Ambulatory Visit (HOSPITAL_BASED_OUTPATIENT_CLINIC_OR_DEPARTMENT_OTHER): Payer: Self-pay | Admitting: Orthopedic Surgery

## 2014-01-25 ENCOUNTER — Telehealth: Payer: Self-pay | Admitting: *Deleted

## 2014-01-25 ENCOUNTER — Ambulatory Visit: Payer: Medicare Other | Admitting: Physician Assistant

## 2014-01-25 DIAGNOSIS — S239XXS Sprain of unspecified parts of thorax, sequela: Secondary | ICD-10-CM

## 2014-01-25 DIAGNOSIS — M47816 Spondylosis without myelopathy or radiculopathy, lumbar region: Secondary | ICD-10-CM | POA: Diagnosis not present

## 2014-01-25 DIAGNOSIS — Z0289 Encounter for other administrative examinations: Secondary | ICD-10-CM

## 2014-01-25 DIAGNOSIS — M546 Pain in thoracic spine: Secondary | ICD-10-CM | POA: Diagnosis not present

## 2014-01-25 NOTE — Telephone Encounter (Signed)
Pt did not show for appointment 01/25/2014 at 3:00 for diarrhea and vomitting

## 2014-01-27 DIAGNOSIS — M546 Pain in thoracic spine: Secondary | ICD-10-CM | POA: Diagnosis not present

## 2014-01-29 ENCOUNTER — Encounter: Payer: Self-pay | Admitting: Family Medicine

## 2014-01-30 ENCOUNTER — Ambulatory Visit (HOSPITAL_BASED_OUTPATIENT_CLINIC_OR_DEPARTMENT_OTHER): Payer: Medicare Other

## 2014-02-01 ENCOUNTER — Other Ambulatory Visit (HOSPITAL_COMMUNITY): Payer: Self-pay | Admitting: Orthopedic Surgery

## 2014-02-01 DIAGNOSIS — M546 Pain in thoracic spine: Secondary | ICD-10-CM

## 2014-02-03 ENCOUNTER — Encounter: Payer: Self-pay | Admitting: General Practice

## 2014-02-03 ENCOUNTER — Other Ambulatory Visit: Payer: Self-pay | Admitting: General Practice

## 2014-02-03 DIAGNOSIS — I10 Essential (primary) hypertension: Secondary | ICD-10-CM | POA: Diagnosis not present

## 2014-02-03 DIAGNOSIS — E785 Hyperlipidemia, unspecified: Secondary | ICD-10-CM | POA: Diagnosis not present

## 2014-02-03 DIAGNOSIS — E039 Hypothyroidism, unspecified: Secondary | ICD-10-CM | POA: Diagnosis not present

## 2014-02-03 DIAGNOSIS — E669 Obesity, unspecified: Secondary | ICD-10-CM | POA: Diagnosis not present

## 2014-02-03 DIAGNOSIS — I2510 Atherosclerotic heart disease of native coronary artery without angina pectoris: Secondary | ICD-10-CM | POA: Diagnosis not present

## 2014-02-03 MED ORDER — METOPROLOL TARTRATE 25 MG PO TABS: ORAL_TABLET | ORAL | Status: DC

## 2014-02-04 ENCOUNTER — Encounter (HOSPITAL_COMMUNITY): Admission: RE | Admit: 2014-02-04 | Payer: Medicare Other | Source: Ambulatory Visit

## 2014-02-04 ENCOUNTER — Encounter (HOSPITAL_COMMUNITY)
Admission: RE | Admit: 2014-02-04 | Discharge: 2014-02-04 | Disposition: A | Discharge status: Home / Self Care | Payer: Medicare Other | Source: Ambulatory Visit | Attending: Orthopedic Surgery | Admitting: Orthopedic Surgery

## 2014-02-04 DIAGNOSIS — M546 Pain in thoracic spine: Secondary | ICD-10-CM | POA: Diagnosis not present

## 2014-02-04 DIAGNOSIS — M19072 Primary osteoarthritis, left ankle and foot: Secondary | ICD-10-CM | POA: Diagnosis not present

## 2014-02-04 DIAGNOSIS — M19071 Primary osteoarthritis, right ankle and foot: Secondary | ICD-10-CM | POA: Diagnosis not present

## 2014-02-04 DIAGNOSIS — M170 Bilateral primary osteoarthritis of knee: Secondary | ICD-10-CM | POA: Diagnosis not present

## 2014-02-04 MED ORDER — TECHNETIUM TC 99M MEDRONATE IV KIT
25.0000 | PACK | Freq: Once | INTRAVENOUS | Status: AC | PRN
  Administered 2014-02-04: 25 via INTRAVENOUS

## 2014-02-12 ENCOUNTER — Other Ambulatory Visit: Payer: Self-pay | Admitting: General Practice

## 2014-02-12 MED ORDER — ATORVASTATIN CALCIUM 20 MG PO TABS: ORAL_TABLET | ORAL | Status: DC

## 2014-02-16 DIAGNOSIS — M5134 Other intervertebral disc degeneration, thoracic region: Secondary | ICD-10-CM | POA: Diagnosis not present

## 2014-02-16 DIAGNOSIS — M546 Pain in thoracic spine: Secondary | ICD-10-CM | POA: Diagnosis not present

## 2014-02-23 DIAGNOSIS — M5134 Other intervertebral disc degeneration, thoracic region: Secondary | ICD-10-CM | POA: Diagnosis not present

## 2014-03-08 ENCOUNTER — Telehealth: Payer: Self-pay | Admitting: General Practice

## 2014-03-08 MED ORDER — ALPRAZOLAM 0.5 MG PO TABS: ORAL_TABLET | ORAL | Status: DC

## 2014-03-08 NOTE — Telephone Encounter (Signed)
Last OV 09-18-13 Alprazolam last filled 10-07-13 #60 with 3

## 2014-03-08 NOTE — Telephone Encounter (Signed)
Ok for #30, 3 refills 

## 2014-03-08 NOTE — Telephone Encounter (Signed)
Med filled and faxed.  

## 2014-03-09 DIAGNOSIS — M546 Pain in thoracic spine: Secondary | ICD-10-CM | POA: Diagnosis not present

## 2014-03-09 DIAGNOSIS — M5134 Other intervertebral disc degeneration, thoracic region: Secondary | ICD-10-CM | POA: Diagnosis not present

## 2014-03-15 ENCOUNTER — Other Ambulatory Visit: Payer: Self-pay | Admitting: General Practice

## 2014-03-15 MED ORDER — BUPROPION HCL ER (XL) 300 MG PO TB24: 300.0000 mg | ORAL_TABLET | Freq: Every day | ORAL | Status: DC

## 2014-03-15 MED ORDER — LEVOTHYROXINE SODIUM 50 MCG PO TABS: ORAL_TABLET | ORAL | Status: DC

## 2014-03-15 MED ORDER — AMLODIPINE BESYLATE-VALSARTAN 5-160 MG PO TABS: ORAL_TABLET | ORAL | Status: DC

## 2014-03-16 ENCOUNTER — Other Ambulatory Visit: Payer: Self-pay | Admitting: General Practice

## 2014-03-16 ENCOUNTER — Encounter: Payer: Self-pay | Admitting: General Practice

## 2014-03-16 MED ORDER — AMLODIPINE BESYLATE-VALSARTAN 5-160 MG PO TABS: ORAL_TABLET | ORAL | Status: DC

## 2014-03-17 ENCOUNTER — Other Ambulatory Visit: Payer: Self-pay | Admitting: General Practice

## 2014-03-17 MED ORDER — ATORVASTATIN CALCIUM 20 MG PO TABS: ORAL_TABLET | ORAL | Status: DC

## 2014-04-16 DIAGNOSIS — R0781 Pleurodynia: Secondary | ICD-10-CM | POA: Diagnosis not present

## 2014-04-20 ENCOUNTER — Other Ambulatory Visit: Payer: Self-pay | Admitting: Physical Medicine and Rehabilitation

## 2014-04-20 DIAGNOSIS — M546 Pain in thoracic spine: Secondary | ICD-10-CM

## 2014-04-21 ENCOUNTER — Other Ambulatory Visit: Payer: Self-pay | Admitting: Physical Medicine and Rehabilitation

## 2014-04-21 DIAGNOSIS — M546 Pain in thoracic spine: Secondary | ICD-10-CM

## 2014-04-23 ENCOUNTER — Ambulatory Visit
Admission: RE | Admit: 2014-04-23 | Discharge: 2014-04-23 | Disposition: A | Discharge status: Home / Self Care | Payer: Medicare Other | Source: Ambulatory Visit | Attending: Physical Medicine and Rehabilitation | Admitting: Physical Medicine and Rehabilitation

## 2014-04-23 DIAGNOSIS — M546 Pain in thoracic spine: Secondary | ICD-10-CM

## 2014-04-23 DIAGNOSIS — S299XXA Unspecified injury of thorax, initial encounter: Secondary | ICD-10-CM | POA: Diagnosis not present

## 2014-04-23 DIAGNOSIS — R0781 Pleurodynia: Secondary | ICD-10-CM | POA: Diagnosis not present

## 2014-05-03 ENCOUNTER — Other Ambulatory Visit: Payer: Self-pay | Admitting: General Practice

## 2014-05-03 ENCOUNTER — Other Ambulatory Visit: Payer: Self-pay | Admitting: Family Medicine

## 2014-05-03 MED ORDER — BUPROPION HCL ER (XL) 300 MG PO TB24: 300.0000 mg | ORAL_TABLET | Freq: Every day | ORAL | Status: DC

## 2014-05-03 NOTE — Telephone Encounter (Signed)
Med filled.  

## 2014-05-07 DIAGNOSIS — M1990 Unspecified osteoarthritis, unspecified site: Secondary | ICD-10-CM | POA: Diagnosis not present

## 2014-05-07 DIAGNOSIS — E039 Hypothyroidism, unspecified: Secondary | ICD-10-CM | POA: Diagnosis not present

## 2014-05-07 DIAGNOSIS — I2510 Atherosclerotic heart disease of native coronary artery without angina pectoris: Secondary | ICD-10-CM | POA: Diagnosis not present

## 2014-05-07 DIAGNOSIS — E785 Hyperlipidemia, unspecified: Secondary | ICD-10-CM | POA: Diagnosis not present

## 2014-05-07 DIAGNOSIS — M797 Fibromyalgia: Secondary | ICD-10-CM | POA: Diagnosis not present

## 2014-05-07 DIAGNOSIS — E669 Obesity, unspecified: Secondary | ICD-10-CM | POA: Diagnosis not present

## 2014-05-07 DIAGNOSIS — I10 Essential (primary) hypertension: Secondary | ICD-10-CM | POA: Diagnosis not present

## 2014-05-20 ENCOUNTER — Telehealth: Payer: Self-pay | Admitting: *Deleted

## 2014-05-20 ENCOUNTER — Encounter: Payer: Self-pay | Admitting: *Deleted
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
## 2014-05-21 ENCOUNTER — Ambulatory Visit (INDEPENDENT_AMBULATORY_CARE_PROVIDER_SITE_OTHER): Payer: Medicare Other | Admitting: Family Medicine

## 2014-05-21 ENCOUNTER — Encounter: Payer: Self-pay | Admitting: Family Medicine

## 2014-05-21 VITALS — BP 134/78 | HR 65 | Temp 98.0°F | Resp 16 | Ht 66.0 in | Wt 199.0 lb

## 2014-05-21 DIAGNOSIS — E785 Hyperlipidemia, unspecified: Secondary | ICD-10-CM | POA: Diagnosis not present

## 2014-05-21 DIAGNOSIS — I10 Essential (primary) hypertension: Secondary | ICD-10-CM | POA: Diagnosis not present

## 2014-05-21 DIAGNOSIS — E119 Type 2 diabetes mellitus without complications: Secondary | ICD-10-CM

## 2014-05-21 DIAGNOSIS — J209 Acute bronchitis, unspecified: Secondary | ICD-10-CM | POA: Diagnosis not present

## 2014-05-21 DIAGNOSIS — E1169 Type 2 diabetes mellitus with other specified complication: Secondary | ICD-10-CM

## 2014-05-21 DIAGNOSIS — E038 Other specified hypothyroidism: Secondary | ICD-10-CM | POA: Diagnosis not present

## 2014-05-21 DIAGNOSIS — Z0000 Encounter for general adult medical examination without abnormal findings: Secondary | ICD-10-CM | POA: Diagnosis not present

## 2014-05-21 LAB — CBC WITH DIFFERENTIAL/PLATELET
BASOS PCT: 1 % (ref 0–1)
Basophils Absolute: 0.1 10*3/uL (ref 0.0–0.1)
EOS ABS: 0.3 10*3/uL (ref 0.0–0.7)
EOS PCT: 4 % (ref 0–5)
HEMATOCRIT: 39.3 % (ref 36.0–46.0)
Hemoglobin: 13.2 g/dL (ref 12.0–15.0)
Lymphocytes Relative: 53 % — ABNORMAL HIGH (ref 12–46)
Lymphs Abs: 4.3 10*3/uL — ABNORMAL HIGH (ref 0.7–4.0)
MCH: 30.6 pg (ref 26.0–34.0)
MCHC: 33.6 g/dL (ref 30.0–36.0)
MCV: 91 fL (ref 78.0–100.0)
MONOS PCT: 5 % (ref 3–12)
MPV: 10.8 fL (ref 8.6–12.4)
Monocytes Absolute: 0.4 10*3/uL (ref 0.1–1.0)
NEUTROS PCT: 37 % — AB (ref 43–77)
Neutro Abs: 3 10*3/uL (ref 1.7–7.7)
Platelets: 309 10*3/uL (ref 150–400)
RBC: 4.32 MIL/uL (ref 3.87–5.11)
RDW: 14.1 % (ref 11.5–15.5)
WBC: 8.1 10*3/uL (ref 4.0–10.5)

## 2014-05-21 LAB — HEPATIC FUNCTION PANEL
ALT: 27 U/L (ref 0–35)
AST: 26 U/L (ref 0–37)
Albumin: 4.1 g/dL (ref 3.5–5.2)
Alkaline Phosphatase: 89 U/L (ref 39–117)
BILIRUBIN DIRECT: 0.2 mg/dL (ref 0.0–0.3)
Indirect Bilirubin: 0.5 mg/dL (ref 0.2–1.2)
Total Bilirubin: 0.7 mg/dL (ref 0.2–1.2)
Total Protein: 6.4 g/dL (ref 6.0–8.3)

## 2014-05-21 LAB — BASIC METABOLIC PANEL
BUN: 13 mg/dL (ref 6–23)
CALCIUM: 9 mg/dL (ref 8.4–10.5)
CHLORIDE: 105 meq/L (ref 96–112)
CO2: 29 mEq/L (ref 19–32)
Creat: 0.79 mg/dL (ref 0.50–1.10)
GLUCOSE: 129 mg/dL — AB (ref 70–99)
Potassium: 3.6 mEq/L (ref 3.5–5.3)
SODIUM: 142 meq/L (ref 135–145)

## 2014-05-21 LAB — LIPID PANEL
Cholesterol: 127 mg/dL (ref 0–200)
HDL: 50 mg/dL (ref >39)
LDL Cholesterol: 57 mg/dL (ref 0–99)
TRIGLYCERIDES: 101 mg/dL (ref <150)
Total CHOL/HDL Ratio: 2.5 Ratio
VLDL: 20 mg/dL (ref 0–40)

## 2014-05-21 LAB — TSH: TSH: 1.749 u[IU]/mL (ref 0.350–4.500)

## 2014-05-21 MED ORDER — PROMETHAZINE-DM 6.25-15 MG/5ML PO SYRP: 5.0000 mL | ORAL_SOLUTION | Freq: Four times a day (QID) | ORAL | Status: DC | PRN

## 2014-05-21 MED ORDER — AMOXICILLIN 875 MG PO TABS: 875.0000 mg | ORAL_TABLET | Freq: Two times a day (BID) | ORAL | Status: DC

## 2014-05-21 MED ORDER — ALBUTEROL SULFATE (2.5 MG/3ML) 0.083% IN NEBU
2.5000 mg | INHALATION_SOLUTION | Freq: Once | RESPIRATORY_TRACT | Status: AC
  Administered 2014-05-21: 2.5 mg via RESPIRATORY_TRACT

## 2014-05-21 NOTE — Patient Instructions (Signed)
Follow up in 3-4 months to recheck diabetes We'll notify you of your lab results and make any changes if needed Start the Amoxicillin twice daily for the bronchitis Use Mucinex DM for daytime cough and promethazine syrup for nights/weekends (will cause drowsiness) Drink plenty of fluids REST! Try and make healthy food choices and get regular exercise to control the diabetes Schedule your eye exam at your convenience Call and schedule your mammogram Call with any questions or concerns FEEL BETTER! Happy Valentine's Day!!!

## 2014-05-21 NOTE — Progress Notes (Signed)
   Subjective:    Patient ID: Tracey Evans, female    DOB: 1946-04-04, 69 y.o.   MRN: [id number]  HPI Here today for CPE.  Risk Factors: HTN- chronic problem, on Amlodipine- Valsartan, Metoprolol Hyperlipidemia- chronic problem, on Lipitor DM- chronic problem, attempting to control w/ healthy diet but pt admits to poor compliance recently.  On ARB for renal protection.  Due for eye exam- plans to schedule. Hypothyroid- chronic problem, on Levothyroxine. Cough- sxs started 1 week ago.  No fevers.  Cough is wet but not productive.  No facial pain/pressure.  + nasal congestion.  + sick contacts.  Some SOB, wheezing. Physical Activity: limited. Fall Risk: low Depression: chronic problem for pt.  On Wellbutrin, Celexa and Alprazolam prn. Hearing: normal to conversational tones and whispered voice at 6 ft ADL's: independent Cognitive: normal linear thought process, memory and attention intact Home Safety: safe at home Height, Weight, BMI, Visual Acuity: see vitals, vision corrected to 20/20 w/ glasses Counseling: UTD on colonoscopy, mammo.  No need for paps.  Pt had bone scan 01/2014- will hold on DEXA Labs Ordered: See A&P Care Plan: See A&P    Review of Systems Patient reports no vision/hearing changes, adenopathy,fever, weight change,  persistant/recurrent hoarseness , swallowing issues, chest pain, palpitations, edema, persistant/recurrent cough, hemoptysis, dyspnea (rest/exertional/paroxysmal nocturnal), gastrointestinal bleeding (melena, rectal bleeding), abdominal pain, significant heartburn, bowel changes, GU symptoms (dysuria, hematuria, incontinence), Gyn symptoms (abnormal  bleeding, pain),  syncope, focal weakness, memory loss, numbness & tingling, skin/hair/nail changes, abnormal bruising or bleeding, anxiety, or depression.     Objective:   Physical Exam General Appearance:    Alert, cooperative, no distress, appears stated age  Head:    Normocephalic, without obvious  abnormality, atraumatic  Eyes:    PERRL, conjunctiva/corneas clear, EOM's intact, fundi    benign, both eyes  Ears:    Normal TM's and external ear canals, both ears  Nose:   Nares normal, septum midline, mucosa normal, no drainage    or sinus tenderness  Throat:   Lips, mucosa, and tongue normal; teeth and gums normal  Neck:   Supple, symmetrical, trachea midline, no adenopathy;    Thyroid: no enlargement/tenderness/nodules  Back:     Symmetric, no curvature, ROM normal, no CVA tenderness  Lungs:     Clear to auscultation bilaterally, respirations unlabored but poor air movement diffusely w/ hacking cough- cough and air movement improved s/p neb tx in office  Chest Wall:    No tenderness or deformity   Heart:    Regular rate and rhythm, S1 and S2 normal, no murmur, rub   or gallop  Breast Exam:    Deferred to GYN  Abdomen:     Soft, non-tender, bowel sounds active all four quadrants,    no masses, no organomegaly  Genitalia:    Deferred to GYN  Rectal:    Extremities:   Extremities normal, atraumatic, no cyanosis or edema  Pulses:   2+ and symmetric all extremities  Skin:   Skin color, texture, turgor normal, no rashes or lesions  Lymph nodes:   Cervical, supraclavicular, and axillary nodes normal  Neurologic:   CNII-XII intact, normal strength, sensation and reflexes    throughout          Assessment & Plan:

## 2014-05-21 NOTE — Progress Notes (Signed)
Pre visit review using our clinic review tool, if applicable. No additional management support is needed unless otherwise documented below in the visit note. 

## 2014-05-22 LAB — HEMOGLOBIN A1C
Hgb A1c MFr Bld: 6.1 % — ABNORMAL HIGH (ref <5.7)
Mean Plasma Glucose: 128 mg/dL — ABNORMAL HIGH (ref <117)

## 2014-05-23 NOTE — Assessment & Plan Note (Signed)
New.  Pt's sxs and PE consistent w/ infxn.  Due to DM, have low threshold to start abx.  Pt's cough and air movement improved in office s/p neb tx.  Start abx, cough meds prn.  Reviewed supportive care and red flags that should prompt return.  Pt expressed understanding and is in agreement w/ plan.

## 2014-05-23 NOTE — Assessment & Plan Note (Signed)
Pt's PE WNL w/ exception of obesity and acute bronchitis.  UTD on mammo, colonoscopy.  Due for eye exam- pt to schedule.  Written screening schedule updated and given to pt.  Check labs.  Anticipatory guidance provided.

## 2014-05-23 NOTE — Assessment & Plan Note (Signed)
Chronic problem.  Attempting to control w/ healthy diet, although pt admits she has not done well w/ this lately.  On ARB for renal protection.  Due for eye exam.  Check labs.  Start meds prn.

## 2014-05-23 NOTE — Assessment & Plan Note (Signed)
Chronic problem.  Currently asymptomatic.  Check labs.  Adjust meds prn  

## 2014-05-23 NOTE — Assessment & Plan Note (Signed)
Chronic problem.  Tolerating statin w/o difficulty.  Check labs.  Adjust meds prn  

## 2014-05-23 NOTE — Assessment & Plan Note (Signed)
Chronic problem.  Adequate control.  Asymptomatic.  Check labs.  No anticipated med changes 

## 2014-05-24 ENCOUNTER — Encounter: Payer: Self-pay | Admitting: General Practice

## 2014-05-31 ENCOUNTER — Other Ambulatory Visit: Payer: Self-pay | Admitting: General Practice

## 2014-05-31 ENCOUNTER — Other Ambulatory Visit: Payer: Self-pay

## 2014-05-31 DIAGNOSIS — Z1231 Encounter for screening mammogram for malignant neoplasm of breast: Secondary | ICD-10-CM

## 2014-05-31 MED ORDER — BUPROPION HCL ER (XL) 300 MG PO TB24: 300.0000 mg | ORAL_TABLET | Freq: Every day | ORAL | Status: DC

## 2014-06-01 DIAGNOSIS — M546 Pain in thoracic spine: Secondary | ICD-10-CM | POA: Diagnosis not present

## 2014-06-04 DIAGNOSIS — M5136 Other intervertebral disc degeneration, lumbar region: Secondary | ICD-10-CM | POA: Diagnosis not present

## 2014-06-04 DIAGNOSIS — M797 Fibromyalgia: Secondary | ICD-10-CM | POA: Diagnosis not present

## 2014-06-04 DIAGNOSIS — M150 Primary generalized (osteo)arthritis: Secondary | ICD-10-CM | POA: Diagnosis not present

## 2014-06-10 ENCOUNTER — Ambulatory Visit
Admission: RE | Admit: 2014-06-10 | Discharge: 2014-06-10 | Disposition: A | Discharge status: Home / Self Care | Payer: Medicare Other | Source: Ambulatory Visit

## 2014-06-10 DIAGNOSIS — Z1231 Encounter for screening mammogram for malignant neoplasm of breast: Secondary | ICD-10-CM

## 2014-06-11 DIAGNOSIS — T8484XA Pain due to internal orthopedic prosthetic devices, implants and grafts, initial encounter: Secondary | ICD-10-CM | POA: Diagnosis not present

## 2014-06-11 DIAGNOSIS — M19171 Post-traumatic osteoarthritis, right ankle and foot: Secondary | ICD-10-CM | POA: Diagnosis not present

## 2014-06-12 ENCOUNTER — Other Ambulatory Visit: Payer: Self-pay | Admitting: Family Medicine

## 2014-06-14 NOTE — Telephone Encounter (Signed)
Med filled.  

## 2014-06-15 DIAGNOSIS — M5134 Other intervertebral disc degeneration, thoracic region: Secondary | ICD-10-CM | POA: Diagnosis not present

## 2014-06-15 DIAGNOSIS — M546 Pain in thoracic spine: Secondary | ICD-10-CM | POA: Diagnosis not present

## 2014-06-28 ENCOUNTER — Other Ambulatory Visit: Payer: Self-pay | Admitting: General Practice

## 2014-06-28 MED ORDER — AMLODIPINE BESYLATE-VALSARTAN 5-160 MG PO TABS: ORAL_TABLET | ORAL | Status: DC

## 2014-07-07 ENCOUNTER — Telehealth: Payer: Self-pay | Admitting: Family Medicine

## 2014-07-07 ENCOUNTER — Ambulatory Visit (INDEPENDENT_AMBULATORY_CARE_PROVIDER_SITE_OTHER): Payer: Medicare Other | Admitting: Physician Assistant

## 2014-07-07 ENCOUNTER — Encounter: Payer: Self-pay | Admitting: Physician Assistant

## 2014-07-07 VITALS — BP 139/88 | HR 95 | Temp 101.3°F | Resp 20 | Ht 65.0 in | Wt 200.1 lb

## 2014-07-07 DIAGNOSIS — R6889 Other general symptoms and signs: Secondary | ICD-10-CM

## 2014-07-07 MED ORDER — OSELTAMIVIR PHOSPHATE 75 MG PO CAPS: 75.0000 mg | ORAL_CAPSULE | Freq: Two times a day (BID) | ORAL | Status: AC

## 2014-07-07 MED ORDER — BENZONATATE 100 MG PO CAPS: 100.0000 mg | ORAL_CAPSULE | Freq: Two times a day (BID) | ORAL | Status: AC | PRN

## 2014-07-07 NOTE — Patient Instructions (Signed)
Please take the Tamiflu as directed. Stay well-hydrated and get plenty of rest. Use the Tessalon as directed for cough. Use over-the-counter pain medications as needed for aches and fever. Call or return to clinic if anything worsens.

## 2014-07-07 NOTE — Telephone Encounter (Signed)
Patient Name: Tracey Evans DOB: 1945/05/12 Initial Comment Caller states she has flu symptoms and wants to know what to get over the counter Nurse Assessment Nurse: Vallery Sa, RN, Tye [place] Date/Time Eilene Ghazi Time): 07/07/2014 2:50:12 PM Confirm and document reason for call. If symptomatic, describe symptoms. ---Caller states she developed Flu symptoms 2 days ago (sore throat, cough, cold, headache, muscle aches and fever 102.0 rectal today). No severe breathing difficulty. Has the patient traveled out of the country within the last 30 days? ---No Does the patient require triage? ---Yes Related visit to physician within the last 2 weeks? ---No Does the PT have any chronic conditions? (i.e. diabetes, asthma, etc.) ---Yes List chronic conditions. ---Osteo Arthritis, Fibromyalgia, Angina (No chest pain at this time), High Blood Pressure and Cholesterol Guidelines Guideline Title Affirmed Question Affirmed Notes Influenza - Seasonal [1] Fever > 101 F (38.3 C) AND [2] age > 105 Final Disposition User See Physician within 4 Hours (or PCP triage) Vallery Sa, RN, Tye [place] Comments 4pm appointment scheduled with Dr. Birdie Riddle today.

## 2014-07-09 DIAGNOSIS — R6889 Other general symptoms and signs: Secondary | ICD-10-CM | POA: Insufficient documentation

## 2014-07-09 NOTE — Progress Notes (Signed)
Patient presents to clinic today c/o sudden onset of head congestion, postnasal drip, sore throat, fever, body aches and fatigue. Symptoms of them present less than 48 hours. Patient endorses positive sick contact with the flu. Denies recent travel.  Past Medical History  Diagnosis Date  . GERD (gastroesophageal reflux disease)   . Hyperlipidemia   . Hypertension   . Depression   . Fibromyalgia   . Peptic ulcer disease   . Hypothyroidism   . Low back pain   . Osteoarthritis   . Asthma   . Diverticular disease   . Complication of anesthesia     had bronc spasms during intubation surgery 2009 on foot-need albuterol inhaler or neb tx preop  . Angina at rest     "occurs whenever it wants to, but worse during agitation"  . Binge eating disorder   . Costochondritis     Current Outpatient Prescriptions on File Prior to Visit  Medication Sig Dispense Refill  . albuterol (PROAIR HFA) 108 (90 BASE) MCG/ACT inhaler Inhale 2 puffs into the lungs every 6 (six) hours as needed for wheezing. 1 Inhaler 3  . ALPRAZolam (XANAX) 0.5 MG tablet TAKE 2 TABLETS BY MOUTH TWICE A DAY AS NEEDED AT 10 AM AND 5 PM 60 tablet 3  . amLODipine-valsartan (EXFORGE) 5-160 MG per tablet TAKE 1 TABLET BY MOUTH DAILY. 90 tablet 1  . aspirin 81 MG tablet Take 81 mg by mouth daily.    Marland Kitchen atorvastatin (LIPITOR) 20 MG tablet TAKE 1 TABLET EVERY DAY 30 tablet 6  . buPROPion (WELLBUTRIN XL) 300 MG 24 hr tablet Take 1 tablet (300 mg total) by mouth daily. 30 tablet 3  . citalopram (CELEXA) 20 MG tablet TAKE 1 TABLET (20 MG TOTAL) BY MOUTH DAILY. 30 tablet 3  . clonazePAM (KLONOPIN) 0.5 MG tablet TAKE 1 TABLET BY MOUTH AT BEDTIME AS NEEDED 30 tablet 0  . cyclobenzaprine (FLEXERIL) 5 MG tablet Take 5 mg by mouth 2 (two) times daily as needed. For pain    . levothyroxine (SYNTHROID, LEVOTHROID) 50 MCG tablet TAKE 1 TABLET BY MOUTH ONCE DAILY 30 tablet 6  . meclizine (ANTIVERT) 25 MG tablet Take 1 tablet (25 mg total) by  mouth 3 (three) times daily as needed. For vertigo 30 tablet 3  . metaxalone (SKELAXIN) 800 MG tablet Take 1 tablet (800 mg total) by mouth 3 (three) times daily. 21 tablet 0  . metoprolol tartrate (LOPRESSOR) 25 MG tablet TAKE 1 TABLET (25 MG TOTAL) BY MOUTH 2 (TWO) TIMES DAILY. 180 tablet 0  . nitroGLYCERIN (NITROSTAT) 0.4 MG SL tablet Place 1 tablet (0.4 mg total) under the tongue every 5 (five) minutes as needed. As needed for chest pain 30 tablet 3  . ondansetron (ZOFRAN) 8 MG tablet Take 1 tablet (8 mg total) by mouth every 8 (eight) hours as needed for nausea or vomiting. 10 tablet 0  . promethazine-dextromethorphan (PROMETHAZINE-DM) 6.25-15 MG/5ML syrup Take 5 mLs by mouth 4 (four) times daily as needed. 240 mL 0  . ranitidine (ZANTAC) 150 MG tablet Take 150 mg by mouth 2 (two) times daily.     No current facility-administered medications on file prior to visit.    Allergies  Allergen Reactions  . Contrast Media [Iodinated Diagnostic Agents] Shortness Of Breath    Patient has SOB, tightness in chest and feels like an elephant is sitting on chest, patient should be  scanned at hospital Per Dr. Alvester Chou  . Dilaudid [Hydromorphone Hcl] Anaphylaxis  .  Adhesive [Tape]     blister  . Codeine     REACTION: nausea ,insomnia  . Dilaudid [Hydromorphone] Other (See Comments)    Pt stopped breathing  . Lactose Intolerance (Gi)     Family History  Problem Relation Age of Onset  . Dementia Mother   . Dementia Father   . Alcohol abuse Father   . Cancer Sister     breast cancer  . Alcohol abuse Brother     History   Social History  . Marital Status: Single    Spouse Name: N/A  . Number of Children: N/A  . Years of Education: N/A   Social History Main Topics  . Smoking status: Never Smoker   . Smokeless tobacco: Not on file  . Alcohol Use: No  . Drug Use: No  . Sexual Activity: Not on file   Other Topics Concern  . None   Social History Narrative    Review of Systems - See  HPI.  All other ROS are negative.  BP 139/88 mmHg  Pulse 95  Temp(Src) 101.3 F (38.5 C) (Oral)  Resp 20  Ht '5\' 5"'  (1.651 m)  Wt 200 lb 2 oz (90.776 kg)  BMI 33.30 kg/m2  SpO2 100%  Physical Exam  Constitutional: She is oriented to person, place, and time and well-developed, well-nourished, and in no distress.  HENT:  Head: Normocephalic and atraumatic.  Right Ear: External ear normal.  Left Ear: External ear normal.  Nose: Nose normal.  Mouth/Throat: Oropharynx is clear and moist.  Tympanic membranes are within normal limits bilaterally.  Eyes: Conjunctivae are normal.  Neck: Neck supple.  Cardiovascular: Normal rate, regular rhythm, normal heart sounds and intact distal pulses.   Pulmonary/Chest: Effort normal and breath sounds normal. No respiratory distress. She has no wheezes. She has no rales. She exhibits no tenderness.  Lymphadenopathy:    She has no cervical adenopathy.  Neurological: She is alert and oriented to person, place, and time.  Skin: Skin is warm and dry. No rash noted.  Psychiatric: Affect normal.  Vitals reviewed.   Recent Results (from the past 2160 hour(s))  Hemoglobin A1c     Status: Abnormal   Collection Time: 05/21/14  4:10 PM  Result Value Ref Range   Hgb A1c MFr Bld 6.1 (H) <5.7 %    Comment:                                                                        According to the ADA Clinical Practice Recommendations for 2011, when HbA1c is used as a screening test:     >=6.5%   Diagnostic of Diabetes Mellitus            (if abnormal result is confirmed)   5.7-6.4%   Increased risk of developing Diabetes Mellitus   References:Diagnosis and Classification of Diabetes Mellitus,Diabetes QQPY,1950,93(OIZTI 1):S62-S69 and Standards of Medical Care in         Diabetes - 2011,Diabetes Care,2011,34 (Suppl 1):S11-S61.      Mean Plasma Glucose 128 (H) <117 mg/dL  Lipid panel     Status: None   Collection Time: 05/21/14  4:10 PM  Result Value  Ref Range   Cholesterol 127 0 - 200  mg/dL    Comment: ATP III Classification:       < 200        mg/dL        Desirable      200 - 239     mg/dL        Borderline High      >= 240        mg/dL        High      Triglycerides 101 <150 mg/dL   HDL 50 >39 mg/dL   Total CHOL/HDL Ratio 2.5 Ratio   VLDL 20 0 - 40 mg/dL   LDL Cholesterol 57 0 - 99 mg/dL    Comment:   Total Cholesterol/HDL Ratio:CHD Risk                        Coronary Heart Disease Risk Table                                        Men       Women          1/2 Average Risk              3.4        3.3              Average Risk              5.0        4.4           2X Average Risk              9.6        7.1           3X Average Risk             23.4       11.0 Use the calculated Patient Ratio above and the CHD Risk table  to determine the patient's CHD Risk. ATP III Classification (LDL):       < 100        mg/dL         Optimal      100 - 129     mg/dL         Near or Above Optimal      130 - 159     mg/dL         Borderline High      160 - 189     mg/dL         High       > 190        mg/dL         Very High     Basic metabolic panel     Status: Abnormal   Collection Time: 05/21/14  4:10 PM  Result Value Ref Range   Sodium 142 135 - 145 mEq/L   Potassium 3.6 3.5 - 5.3 mEq/L   Chloride 105 96 - 112 mEq/L   CO2 29 19 - 32 mEq/L   Glucose, Bld 129 (H) 70 - 99 mg/dL   BUN 13 6 - 23 mg/dL   Creat 0.79 0.50 - 1.10 mg/dL   Calcium 9.0 8.4 - 10.5 mg/dL  Hepatic function panel     Status: None   Collection Time: 05/21/14  4:10 PM  Result Value Ref Range  Total Bilirubin 0.7 0.2 - 1.2 mg/dL   Bilirubin, Direct 0.2 0.0 - 0.3 mg/dL   Indirect Bilirubin 0.5 0.2 - 1.2 mg/dL   Alkaline Phosphatase 89 39 - 117 U/L   AST 26 0 - 37 U/L   ALT 27 0 - 35 U/L   Total Protein 6.4 6.0 - 8.3 g/dL   Albumin 4.1 3.5 - 5.2 g/dL  TSH     Status: None   Collection Time: 05/21/14  4:10 PM  Result Value Ref Range   TSH 1.749 0.350 -  4.500 uIU/mL  CBC with Differential/Platelet     Status: Abnormal   Collection Time: 05/21/14  4:10 PM  Result Value Ref Range   WBC 8.1 4.0 - 10.5 K/uL   RBC 4.32 3.87 - 5.11 MIL/uL   Hemoglobin 13.2 12.0 - 15.0 g/dL   HCT 39.3 36.0 - 46.0 %   MCV 91.0 78.0 - 100.0 fL   MCH 30.6 26.0 - 34.0 pg   MCHC 33.6 30.0 - 36.0 g/dL   RDW 14.1 11.5 - 15.5 %   Platelets 309 150 - 400 K/uL   MPV 10.8 8.6 - 12.4 fL   Neutrophils Relative % 37 (L) 43 - 77 %   Neutro Abs 3.0 1.7 - 7.7 K/uL   Lymphocytes Relative 53 (H) 12 - 46 %   Lymphs Abs 4.3 (H) 0.7 - 4.0 K/uL   Monocytes Relative 5 3 - 12 %   Monocytes Absolute 0.4 0.1 - 1.0 K/uL   Eosinophils Relative 4 0 - 5 %   Eosinophils Absolute 0.3 0.0 - 0.7 K/uL   Basophils Relative 1 0 - 1 %   Basophils Absolute 0.1 0.0 - 0.1 K/uL   Smear Review Criteria for review not met     Assessment/Plan: Flu-like symptoms Flu swab negative, however giving severe  classi classical symptoms and known exposure to, we'll treat for the influenza. Rx Tamiflu. Supportive measures discussed with patient. Follow-up if not improving.

## 2014-07-09 NOTE — Assessment & Plan Note (Signed)
Flu swab negative, however giving severe  classi classical symptoms and known exposure to, we'll treat for the influenza. Rx Tamiflu. Supportive measures discussed with patient. Follow-up if not improving.

## 2014-07-10 ENCOUNTER — Encounter: Payer: Self-pay | Admitting: Family Medicine

## 2014-07-10 ENCOUNTER — Ambulatory Visit (INDEPENDENT_AMBULATORY_CARE_PROVIDER_SITE_OTHER): Payer: Medicare Other | Admitting: Family Medicine

## 2014-07-10 VITALS — BP 112/70 | HR 77 | Temp 98.5°F | Ht 65.0 in | Wt 203.8 lb

## 2014-07-10 DIAGNOSIS — J40 Bronchitis, not specified as acute or chronic: Secondary | ICD-10-CM

## 2014-07-10 DIAGNOSIS — J0110 Acute frontal sinusitis, unspecified: Secondary | ICD-10-CM

## 2014-07-10 MED ORDER — METHYLPREDNISOLONE ACETATE 80 MG/ML IJ SUSP
80.0000 mg | Freq: Once | INTRAMUSCULAR | Status: AC
  Administered 2014-07-10: 80 mg via INTRAMUSCULAR

## 2014-07-10 MED ORDER — AMOXICILLIN-POT CLAVULANATE 875-125 MG PO TABS: 1.0000 | ORAL_TABLET | Freq: Two times a day (BID) | ORAL | Status: DC

## 2014-07-10 MED ORDER — PREDNISONE 10 MG PO TABS: ORAL_TABLET | ORAL | Status: DC

## 2014-07-10 NOTE — Progress Notes (Signed)
Pre visit review using our clinic review tool, if applicable. No additional management support is needed unless otherwise documented below in the visit note. 

## 2014-07-10 NOTE — Patient Instructions (Signed)

## 2014-07-10 NOTE — Progress Notes (Signed)
Subjective:     Tracey Evans is a 69 y.o. female who presents for evaluation of sinus pain. Symptoms include: congestion, cough, facial pain, fevers, headaches, nasal congestion, sinus pressure and sore throat. Onset of symptoms was 5 days ago. Symptoms have been gradually worsening since that time. Past history is significant for asthma. Patient is a non-smoker.  The following portions of the patient's history were reviewed and updated as appropriate:  She  has a past medical history of GERD (gastroesophageal reflux disease); Hyperlipidemia; Hypertension; Depression; Fibromyalgia; Peptic ulcer disease; Hypothyroidism; Low back pain; Osteoarthritis; Asthma; Diverticular disease; Complication of anesthesia; Angina at rest; Binge eating disorder; and Costochondritis. She  does not have any pertinent problems on file. She  has past surgical history that includes Cholecystectomy; Abdominal hysterectomy; Tonsillectomy; Shoulder surgery (0347,4259); Colonoscopy; Ankle fracture surgery (2009); and Ankle arthroscopy (01/02/2012). Her family history includes Alcohol abuse in her brother and father; Cancer in her sister; Dementia in her father and mother. She  reports that she has never smoked. She does not have any smokeless tobacco history on file. She reports that she does not drink alcohol or use illicit drugs. She has a current medication list which includes the following prescription(s): albuterol, alprazolam, amlodipine-valsartan, aspirin, atorvastatin, benzonatate, bupropion, citalopram, clonazepam, cyclobenzaprine, levothyroxine, meclizine, metaxalone, metoprolol tartrate, nitroglycerin, oseltamivir, ranitidine, and ondansetron. Current Outpatient Prescriptions on File Prior to Visit  Medication Sig Dispense Refill  . albuterol (PROAIR HFA) 108 (90 BASE) MCG/ACT inhaler Inhale 2 puffs into the lungs every 6 (six) hours as needed for wheezing. 1 Inhaler 3  . ALPRAZolam (XANAX) 0.5 MG tablet TAKE 2  TABLETS BY MOUTH TWICE A DAY AS NEEDED AT 10 AM AND 5 PM 60 tablet 3  . amLODipine-valsartan (EXFORGE) 5-160 MG per tablet TAKE 1 TABLET BY MOUTH DAILY. 90 tablet 1  . aspirin 81 MG tablet Take 81 mg by mouth daily.    Marland Kitchen atorvastatin (LIPITOR) 20 MG tablet TAKE 1 TABLET EVERY DAY 30 tablet 6  . benzonatate (TESSALON) 100 MG capsule Take 1 capsule (100 mg total) by mouth 2 (two) times daily as needed for cough. 30 capsule 0  . buPROPion (WELLBUTRIN XL) 300 MG 24 hr tablet Take 1 tablet (300 mg total) by mouth daily. 30 tablet 3  . citalopram (CELEXA) 20 MG tablet TAKE 1 TABLET (20 MG TOTAL) BY MOUTH DAILY. 30 tablet 3  . clonazePAM (KLONOPIN) 0.5 MG tablet TAKE 1 TABLET BY MOUTH AT BEDTIME AS NEEDED 30 tablet 0  . cyclobenzaprine (FLEXERIL) 5 MG tablet Take 5 mg by mouth 2 (two) times daily as needed. For pain    . levothyroxine (SYNTHROID, LEVOTHROID) 50 MCG tablet TAKE 1 TABLET BY MOUTH ONCE DAILY 30 tablet 6  . meclizine (ANTIVERT) 25 MG tablet Take 1 tablet (25 mg total) by mouth 3 (three) times daily as needed. For vertigo 30 tablet 3  . metaxalone (SKELAXIN) 800 MG tablet Take 1 tablet (800 mg total) by mouth 3 (three) times daily. 21 tablet 0  . metoprolol tartrate (LOPRESSOR) 25 MG tablet TAKE 1 TABLET (25 MG TOTAL) BY MOUTH 2 (TWO) TIMES DAILY. 180 tablet 0  . nitroGLYCERIN (NITROSTAT) 0.4 MG SL tablet Place 1 tablet (0.4 mg total) under the tongue every 5 (five) minutes as needed. As needed for chest pain 30 tablet 3  . oseltamivir (TAMIFLU) 75 MG capsule Take 1 capsule (75 mg total) by mouth 2 (two) times daily. 10 capsule 0  . ranitidine (ZANTAC) 150 MG tablet Take  150 mg by mouth 2 (two) times daily.    . ondansetron (ZOFRAN) 8 MG tablet Take 1 tablet (8 mg total) by mouth every 8 (eight) hours as needed for nausea or vomiting. (Patient not taking: Reported on 07/10/2014) 10 tablet 0   No current facility-administered medications on file prior to visit.   She is allergic to contrast  media; dilaudid; adhesive; codeine; dilaudid; and lactose intolerance (gi)..  Review of Systems Pertinent items are noted in HPI.   Objective:    BP 112/70 mmHg  Pulse 77  Temp(Src) 98.5 F (36.9 C) (Oral)  Ht 5\' 5"  (1.651 m)  Wt 203 lb 12 oz (92.42 kg)  BMI 33.91 kg/m2  SpO2 97% General appearance: alert, cooperative, appears stated age and no distress Ears: normal TM's and external ear canals both ears Nose: green discharge, moderate congestion, turbinates red, swollen, sinus tenderness bilateral Throat: abnormal findings: mild oropharyngeal erythema Neck: mild anterior cervical adenopathy, supple, symmetrical, trachea midline and thyroid not enlarged, symmetric, no tenderness/mass/nodules Lungs: diminished breath sounds bilaterally and wheezes bilaterally Heart: S1, S2 normal    Assessment:    Acute bacterial sinusitis and bronchitis.    Plan:    Nasal steroids per medication orders. Antihistamines per medication orders. Augmentin per medication orders. depo medrol 80  mg   pred taper R/u next week prn

## 2014-07-10 NOTE — Addendum Note (Signed)
Addended by: Leeanne Rio on: 07/10/2014 09:56 AM   Modules accepted: Orders

## 2014-07-12 DIAGNOSIS — G894 Chronic pain syndrome: Secondary | ICD-10-CM | POA: Diagnosis not present

## 2014-07-12 DIAGNOSIS — G588 Other specified mononeuropathies: Secondary | ICD-10-CM | POA: Diagnosis not present

## 2014-07-12 DIAGNOSIS — Z79899 Other long term (current) drug therapy: Secondary | ICD-10-CM | POA: Diagnosis not present

## 2014-07-12 DIAGNOSIS — Z5181 Encounter for therapeutic drug level monitoring: Secondary | ICD-10-CM | POA: Diagnosis not present

## 2014-07-18 ENCOUNTER — Encounter (HOSPITAL_BASED_OUTPATIENT_CLINIC_OR_DEPARTMENT_OTHER): Payer: Self-pay

## 2014-07-18 ENCOUNTER — Emergency Department (HOSPITAL_BASED_OUTPATIENT_CLINIC_OR_DEPARTMENT_OTHER)
Admission: EM | Admit: 2014-07-18 | Discharge: 2014-07-18 | Disposition: A | Discharge status: Home / Self Care | Payer: Medicare Other | Attending: Emergency Medicine | Admitting: Emergency Medicine

## 2014-07-18 DIAGNOSIS — M797 Fibromyalgia: Secondary | ICD-10-CM | POA: Insufficient documentation

## 2014-07-18 DIAGNOSIS — Z8711 Personal history of peptic ulcer disease: Secondary | ICD-10-CM | POA: Insufficient documentation

## 2014-07-18 DIAGNOSIS — M1990 Unspecified osteoarthritis, unspecified site: Secondary | ICD-10-CM | POA: Diagnosis not present

## 2014-07-18 DIAGNOSIS — Z90710 Acquired absence of both cervix and uterus: Secondary | ICD-10-CM | POA: Insufficient documentation

## 2014-07-18 DIAGNOSIS — Z7982 Long term (current) use of aspirin: Secondary | ICD-10-CM | POA: Insufficient documentation

## 2014-07-18 DIAGNOSIS — Z79899 Other long term (current) drug therapy: Secondary | ICD-10-CM | POA: Insufficient documentation

## 2014-07-18 DIAGNOSIS — F329 Major depressive disorder, single episode, unspecified: Secondary | ICD-10-CM | POA: Insufficient documentation

## 2014-07-18 DIAGNOSIS — K219 Gastro-esophageal reflux disease without esophagitis: Secondary | ICD-10-CM | POA: Insufficient documentation

## 2014-07-18 DIAGNOSIS — J45909 Unspecified asthma, uncomplicated: Secondary | ICD-10-CM | POA: Insufficient documentation

## 2014-07-18 DIAGNOSIS — I10 Essential (primary) hypertension: Secondary | ICD-10-CM | POA: Diagnosis not present

## 2014-07-18 DIAGNOSIS — Z9049 Acquired absence of other specified parts of digestive tract: Secondary | ICD-10-CM | POA: Insufficient documentation

## 2014-07-18 DIAGNOSIS — E039 Hypothyroidism, unspecified: Secondary | ICD-10-CM | POA: Insufficient documentation

## 2014-07-18 DIAGNOSIS — K529 Noninfective gastroenteritis and colitis, unspecified: Secondary | ICD-10-CM | POA: Insufficient documentation

## 2014-07-18 DIAGNOSIS — E785 Hyperlipidemia, unspecified: Secondary | ICD-10-CM | POA: Insufficient documentation

## 2014-07-18 DIAGNOSIS — R1084 Generalized abdominal pain: Secondary | ICD-10-CM | POA: Diagnosis present

## 2014-07-18 LAB — COMPREHENSIVE METABOLIC PANEL
ALBUMIN: 4.2 g/dL (ref 3.5–5.2)
ALK PHOS: 83 U/L (ref 39–117)
ALT: 40 U/L — ABNORMAL HIGH (ref 0–35)
AST: 27 U/L (ref 0–37)
Anion gap: 10 (ref 5–15)
BUN: 17 mg/dL (ref 6–23)
CALCIUM: 9.1 mg/dL (ref 8.4–10.5)
CO2: 29 mmol/L (ref 19–32)
CREATININE: 1 mg/dL (ref 0.50–1.10)
Chloride: 101 mmol/L (ref 96–112)
GFR calc Af Amer: 66 mL/min — ABNORMAL LOW (ref >90)
GFR, EST NON AFRICAN AMERICAN: 57 mL/min — AB (ref >90)
GLUCOSE: 139 mg/dL — AB (ref 70–99)
Potassium: 4.1 mmol/L (ref 3.5–5.1)
SODIUM: 140 mmol/L (ref 135–145)
Total Bilirubin: 0.7 mg/dL (ref 0.3–1.2)
Total Protein: 6.5 g/dL (ref 6.0–8.3)

## 2014-07-18 LAB — URINALYSIS, ROUTINE W REFLEX MICROSCOPIC
GLUCOSE, UA: NEGATIVE mg/dL
Hgb urine dipstick: NEGATIVE
Ketones, ur: 15 mg/dL — AB
Nitrite: NEGATIVE
PH: 5 (ref 5.0–8.0)
PROTEIN: 30 mg/dL — AB
Specific Gravity, Urine: 1.03 (ref 1.005–1.030)
Urobilinogen, UA: 0.2 mg/dL (ref 0.0–1.0)

## 2014-07-18 LAB — CBC WITH DIFFERENTIAL/PLATELET
BASOS PCT: 0 % (ref 0–1)
Basophils Absolute: 0 10*3/uL (ref 0.0–0.1)
EOS ABS: 0.1 10*3/uL (ref 0.0–0.7)
EOS PCT: 1 % (ref 0–5)
HEMATOCRIT: 44.4 % (ref 36.0–46.0)
HEMOGLOBIN: 14.2 g/dL (ref 12.0–15.0)
LYMPHS ABS: 0.8 10*3/uL (ref 0.7–4.0)
Lymphocytes Relative: 6 % — ABNORMAL LOW (ref 12–46)
MCH: 30 pg (ref 26.0–34.0)
MCHC: 32 g/dL (ref 30.0–36.0)
MCV: 93.9 fL (ref 78.0–100.0)
Monocytes Absolute: 0.9 10*3/uL (ref 0.1–1.0)
Monocytes Relative: 6 % (ref 3–12)
NEUTROS PCT: 88 % — AB (ref 43–77)
Neutro Abs: 12.9 10*3/uL — ABNORMAL HIGH (ref 1.7–7.7)
Platelets: 338 10*3/uL (ref 150–400)
RBC: 4.73 MIL/uL (ref 3.87–5.11)
RDW: 14 % (ref 11.5–15.5)
WBC: 14.7 10*3/uL — AB (ref 4.0–10.5)

## 2014-07-18 LAB — URINE MICROSCOPIC-ADD ON

## 2014-07-18 MED ORDER — SODIUM CHLORIDE 0.9 % IV BOLUS (SEPSIS)
1000.0000 mL | Freq: Once | INTRAVENOUS | Status: AC
  Administered 2014-07-18: 1000 mL via INTRAVENOUS

## 2014-07-18 MED ORDER — METRONIDAZOLE 500 MG PO TABS: 500.0000 mg | ORAL_TABLET | Freq: Three times a day (TID) | ORAL | Status: DC

## 2014-07-18 MED ORDER — ONDANSETRON 8 MG PO TBDP: ORAL_TABLET | ORAL | Status: DC

## 2014-07-18 NOTE — ED Notes (Addendum)
Pt reports last night with diffuse abd pain and n/v, diarrhea.  No fever.

## 2014-07-18 NOTE — Discharge Instructions (Signed)
Zofran as prescribed as needed for nausea.  If you are not improving in the next 24 hours, fill the prescription for Flagyl you have been given.  Return to the emergency department for severe abdominal pain, bloody stool, or other new and concerning symptoms.   Viral Gastroenteritis Viral gastroenteritis is also known as stomach flu. This condition affects the stomach and intestinal tract. It can cause sudden diarrhea and vomiting. The illness typically lasts 3 to 8 days. Most people develop an immune response that eventually gets rid of the virus. While this natural response develops, the virus can make you quite ill. CAUSES  Many different viruses can cause gastroenteritis, such as rotavirus or noroviruses. You can catch one of these viruses by consuming contaminated food or water. You may also catch a virus by sharing utensils or other personal items with an infected person or by touching a contaminated surface. SYMPTOMS  The most common symptoms are diarrhea and vomiting. These problems can cause a severe loss of body fluids (dehydration) and a body salt (electrolyte) imbalance. Other symptoms may include:  Fever.  Headache.  Fatigue.  Abdominal pain. DIAGNOSIS  Your caregiver can usually diagnose viral gastroenteritis based on your symptoms and a physical exam. A stool sample may also be taken to test for the presence of viruses or other infections. TREATMENT  This illness typically goes away on its own. Treatments are aimed at rehydration. The most serious cases of viral gastroenteritis involve vomiting so severely that you are not able to keep fluids down. In these cases, fluids must be given through an intravenous line (IV). HOME CARE INSTRUCTIONS   Drink enough fluids to keep your urine clear or pale yellow. Drink small amounts of fluids frequently and increase the amounts as tolerated.  Ask your caregiver for specific rehydration instructions.  Avoid:  Foods high in  sugar.  Alcohol.  Carbonated drinks.  Tobacco.  Juice.  Caffeine drinks.  Extremely hot or cold fluids.  Fatty, greasy foods.  Too much intake of anything at one time.  Dairy products until 24 to 48 hours after diarrhea stops.  You may consume probiotics. Probiotics are active cultures of beneficial bacteria. They may lessen the amount and number of diarrheal stools in adults. Probiotics can be found in yogurt with active cultures and in supplements.  Wash your hands well to avoid spreading the virus.  Only take over-the-counter or prescription medicines for pain, discomfort, or fever as directed by your caregiver. Do not give aspirin to children. Antidiarrheal medicines are not recommended.  Ask your caregiver if you should continue to take your regular prescribed and over-the-counter medicines.  Keep all follow-up appointments as directed by your caregiver. SEEK IMMEDIATE MEDICAL CARE IF:   You are unable to keep fluids down.  You do not urinate at least once every 6 to 8 hours.  You develop shortness of breath.  You notice blood in your stool or vomit. This may look like coffee grounds.  You have abdominal pain that increases or is concentrated in one small area (localized).  You have persistent vomiting or diarrhea.  You have a fever.  The patient is a child younger than 3 months, and he or she has a fever.  The patient is a child older than 3 months, and he or she has a fever and persistent symptoms.  The patient is a child older than 3 months, and he or she has a fever and symptoms suddenly get worse.  The patient is a  baby, and he or she has no tears when crying. MAKE SURE YOU:   Understand these instructions.  Will watch your condition.  Will get help right away if you are not doing well or get worse. Document Released: 03/26/2005 Document Revised: 06/18/2011 Document Reviewed: 01/10/2011 [facility] Patient Information 2015 [place], [place]. This  information is not intended to replace advice given to you by your health care provider. Make sure you discuss any questions you have with your health care provider.

## 2014-07-18 NOTE — ED Provider Notes (Signed)
CSN: [id number]     Arrival date & time 07/18/14  1129 History   First MD Initiated Contact with Patient 07/18/14 1200     Chief Complaint  Patient presents with  . Abdominal Pain     (Consider location/radiation/quality/duration/timing/severity/associated sxs/prior Treatment) HPI Comments: Patient is a 69 year old female who presents with complaints of nausea vomiting and diarrhea that started last night. She reports multiple episodes of this over the past 12 hours. All has been nonbloody and nonbilious. She denies fevers or chills.  Patient is a 69 y.o. female presenting with abdominal pain. The history is provided by the patient.  Abdominal Pain Pain location:  Generalized Pain quality: cramping   Pain radiates to:  Does not radiate Pain severity:  Moderate Onset quality:  Sudden Timing:  Constant Progression:  Worsening Chronicity:  New Context: not sick contacts and not suspicious food intake   Relieved by:  Nothing Worsened by:  Nothing tried   Past Medical History  Diagnosis Date  . GERD (gastroesophageal reflux disease)   . Hyperlipidemia   . Hypertension   . Depression   . Fibromyalgia   . Peptic ulcer disease   . Hypothyroidism   . Low back pain   . Osteoarthritis   . Asthma   . Diverticular disease   . Complication of anesthesia     had bronc spasms during intubation surgery 2009 on foot-need albuterol inhaler or neb tx preop  . Angina at rest     "occurs whenever it wants to, but worse during agitation"  . Binge eating disorder   . Costochondritis    Past Surgical History  Procedure Laterality Date  . Cholecystectomy    . Abdominal hysterectomy    . Tonsillectomy    . Shoulder surgery  2009,2011    rt and lt  . Colonoscopy    . Ankle fracture surgery  2009    rt   . Ankle arthroscopy  01/02/2012    Procedure: ANKLE ARTHROSCOPY;  Surgeon: Colin Rhein, MD;  Location: [facility];  Service: Orthopedics;  Laterality: Right;  right  ankle arthroscopy with extensive debridement , dridement and drilling talar dome osteochondral lesion   Family History  Problem Relation Age of Onset  . Dementia Mother   . Dementia Father   . Alcohol abuse Father   . Cancer Sister     breast cancer  . Alcohol abuse Brother    History  Substance Use Topics  . Smoking status: Never Smoker   . Smokeless tobacco: Not on file  . Alcohol Use: No   OB History    No data available     Review of Systems  Gastrointestinal: Positive for abdominal pain.  All other systems reviewed and are negative.     Allergies  Contrast media; Dilaudid; Adhesive; Codeine; Dilaudid; and Lactose intolerance (gi)  Home Medications   Prior to Admission medications   Medication Sig Start Date End Date Taking? Authorizing Provider  albuterol (PROAIR HFA) 108 (90 BASE) MCG/ACT inhaler Inhale 2 puffs into the lungs every 6 (six) hours as needed for wheezing. 07/14/12   Midge Minium, MD  ALPRAZolam Duanne Moron) 0.5 MG tablet TAKE 2 TABLETS BY MOUTH TWICE A DAY AS NEEDED AT 10 AM AND 5 PM 03/08/14   Midge Minium, MD  amLODipine-valsartan (EXFORGE) 5-160 MG per tablet TAKE 1 TABLET BY MOUTH DAILY. 06/28/14   Midge Minium, MD  amoxicillin-clavulanate (AUGMENTIN) 875-125 MG per tablet Take 1 tablet by mouth  2 (two) times daily. 07/10/14   Rosalita Chessman, DO  aspirin 81 MG tablet Take 81 mg by mouth daily.    Historical Provider, MD  atorvastatin (LIPITOR) 20 MG tablet TAKE 1 TABLET EVERY DAY 06/14/14   Midge Minium, MD  buPROPion (WELLBUTRIN XL) 300 MG 24 hr tablet Take 1 tablet (300 mg total) by mouth daily. 05/31/14   Midge Minium, MD  citalopram (CELEXA) 20 MG tablet TAKE 1 TABLET (20 MG TOTAL) BY MOUTH DAILY. 12/21/13   Midge Minium, MD  clonazePAM (KLONOPIN) 0.5 MG tablet TAKE 1 TABLET BY MOUTH AT BEDTIME AS NEEDED    Midge Minium, MD  cyclobenzaprine (FLEXERIL) 5 MG tablet Take 5 mg by mouth 2 (two) times daily as needed. For  pain    Historical Provider, MD  levothyroxine (SYNTHROID, LEVOTHROID) 50 MCG tablet TAKE 1 TABLET BY MOUTH ONCE DAILY 06/14/14   Midge Minium, MD  meclizine (ANTIVERT) 25 MG tablet Take 1 tablet (25 mg total) by mouth 3 (three) times daily as needed. For vertigo 05/12/12   Midge Minium, MD  metaxalone (SKELAXIN) 800 MG tablet Take 1 tablet (800 mg total) by mouth 3 (three) times daily. 11/10/13   Alfonzo Beers, MD  metoprolol tartrate (LOPRESSOR) 25 MG tablet TAKE 1 TABLET (25 MG TOTAL) BY MOUTH 2 (TWO) TIMES DAILY. 05/03/14   Midge Minium, MD  nitroGLYCERIN (NITROSTAT) 0.4 MG SL tablet Place 1 tablet (0.4 mg total) under the tongue every 5 (five) minutes as needed. As needed for chest pain 05/12/12   Midge Minium, MD  ondansetron (ZOFRAN) 8 MG tablet Take 1 tablet (8 mg total) by mouth every 8 (eight) hours as needed for nausea or vomiting. Patient not taking: Reported on 07/10/2014 12/31/13   Orlie Dakin, MD  predniSONE (DELTASONE) 10 MG tablet 3 po qd for 3 days then 2 po qd for 3 days the 1 po qd for 3 days 07/10/14   Rosalita Chessman, DO  ranitidine (ZANTAC) 150 MG tablet Take 150 mg by mouth 2 (two) times daily.    Historical Provider, MD   BP 127/88 mmHg  Pulse 102  Temp(Src) 98.6 F (37 C) (Axillary)  Resp 18  Ht 5\' 5"  (1.651 m)  Wt 202 lb (91.627 kg)  BMI 33.61 kg/m2  SpO2 100% Physical Exam  Constitutional: She is oriented to person, place, and time. She appears well-developed and well-nourished. No distress.  HENT:  Head: Normocephalic and atraumatic.  Neck: Normal range of motion. Neck supple.  Cardiovascular: Normal rate and regular rhythm.  Exam reveals no gallop and no friction rub.   No murmur heard. Pulmonary/Chest: Effort normal and breath sounds normal. No respiratory distress. She has no wheezes.  Abdominal: Soft. Bowel sounds are normal. She exhibits no distension. There is no tenderness.  Musculoskeletal: Normal range of motion.  Neurological: She is  alert and oriented to person, place, and time.  Skin: Skin is warm and dry. She is not diaphoretic.  Nursing note and vitals reviewed.   ED Course  Procedures (including critical care time) Labs Review Labs Reviewed  URINALYSIS, ROUTINE W REFLEX MICROSCOPIC - Abnormal; Notable for the following:    Color, Urine AMBER (*)    APPearance CLOUDY (*)    Bilirubin Urine SMALL (*)    Ketones, ur 15 (*)    Protein, ur 30 (*)    Leukocytes, UA SMALL (*)    All other components within normal limits  URINE  MICROSCOPIC-ADD ON - Abnormal; Notable for the following:    Bacteria, UA MANY (*)    All other components within normal limits  CLOSTRIDIUM DIFFICILE BY PCR  CBC WITH DIFFERENTIAL/PLATELET  COMPREHENSIVE METABOLIC PANEL    Imaging Review No results found.   EKG Interpretation None      MDM   Final diagnoses:  None    Patient is a 69 year old female who presents with nausea, vomiting, and diarrhea that started yesterday evening. She is currently being treated with Augmentin, Raising my concern for C. difficile. She was given Zofran and fluids and is now feeling completely improved. She has had no further diarrhea for which we can collect a C. difficile antigen. Clinically this appears to be a viral gastroenteritis. Rather than prescribe another antibiotic, I will discharge her to home with presumed viral gastroenteritis. I will give her a prescription for Flagyl. If she worsens she is to get this filled.     Veryl Speak, MD 07/18/14 [phone]

## 2014-07-30 ENCOUNTER — Telehealth: Payer: Self-pay | Admitting: General Practice

## 2014-07-30 MED ORDER — ALPRAZOLAM 0.5 MG PO TABS: ORAL_TABLET | ORAL | Status: DC

## 2014-07-30 NOTE — Telephone Encounter (Signed)
Ok got #60, 3 refills

## 2014-07-30 NOTE — Telephone Encounter (Signed)
Med filled and faxed.  

## 2014-07-30 NOTE — Telephone Encounter (Signed)
Last OV 05-21-14 Alprazolam last filled 03-08-14 #60 with 3

## 2014-08-02 DIAGNOSIS — M797 Fibromyalgia: Secondary | ICD-10-CM | POA: Diagnosis not present

## 2014-08-02 DIAGNOSIS — I2510 Atherosclerotic heart disease of native coronary artery without angina pectoris: Secondary | ICD-10-CM | POA: Diagnosis not present

## 2014-08-02 DIAGNOSIS — M1990 Unspecified osteoarthritis, unspecified site: Secondary | ICD-10-CM | POA: Diagnosis not present

## 2014-08-02 DIAGNOSIS — I10 Essential (primary) hypertension: Secondary | ICD-10-CM | POA: Diagnosis not present

## 2014-08-02 DIAGNOSIS — E785 Hyperlipidemia, unspecified: Secondary | ICD-10-CM | POA: Diagnosis not present

## 2014-08-02 DIAGNOSIS — E039 Hypothyroidism, unspecified: Secondary | ICD-10-CM | POA: Diagnosis not present

## 2014-08-03 DIAGNOSIS — G8918 Other acute postprocedural pain: Secondary | ICD-10-CM | POA: Diagnosis not present

## 2014-08-03 DIAGNOSIS — T8484XA Pain due to internal orthopedic prosthetic devices, implants and grafts, initial encounter: Secondary | ICD-10-CM | POA: Diagnosis not present

## 2014-08-14 ENCOUNTER — Encounter (HOSPITAL_BASED_OUTPATIENT_CLINIC_OR_DEPARTMENT_OTHER): Payer: Self-pay | Admitting: *Deleted

## 2014-08-14 ENCOUNTER — Emergency Department (HOSPITAL_BASED_OUTPATIENT_CLINIC_OR_DEPARTMENT_OTHER): Payer: Medicare Other

## 2014-08-14 ENCOUNTER — Emergency Department (HOSPITAL_BASED_OUTPATIENT_CLINIC_OR_DEPARTMENT_OTHER)
Admission: EM | Admit: 2014-08-14 | Discharge: 2014-08-14 | Disposition: A | Discharge status: Home / Self Care | Payer: Medicare Other | Attending: Emergency Medicine | Admitting: Emergency Medicine

## 2014-08-14 DIAGNOSIS — M797 Fibromyalgia: Secondary | ICD-10-CM | POA: Diagnosis not present

## 2014-08-14 DIAGNOSIS — Z79899 Other long term (current) drug therapy: Secondary | ICD-10-CM | POA: Diagnosis not present

## 2014-08-14 DIAGNOSIS — I10 Essential (primary) hypertension: Secondary | ICD-10-CM | POA: Diagnosis not present

## 2014-08-14 DIAGNOSIS — M1990 Unspecified osteoarthritis, unspecified site: Secondary | ICD-10-CM | POA: Insufficient documentation

## 2014-08-14 DIAGNOSIS — E039 Hypothyroidism, unspecified: Secondary | ICD-10-CM | POA: Insufficient documentation

## 2014-08-14 DIAGNOSIS — F329 Major depressive disorder, single episode, unspecified: Secondary | ICD-10-CM | POA: Diagnosis not present

## 2014-08-14 DIAGNOSIS — Z7982 Long term (current) use of aspirin: Secondary | ICD-10-CM | POA: Diagnosis not present

## 2014-08-14 DIAGNOSIS — Z9889 Other specified postprocedural states: Secondary | ICD-10-CM | POA: Diagnosis not present

## 2014-08-14 DIAGNOSIS — I209 Angina pectoris, unspecified: Secondary | ICD-10-CM | POA: Diagnosis not present

## 2014-08-14 DIAGNOSIS — Z792 Long term (current) use of antibiotics: Secondary | ICD-10-CM | POA: Insufficient documentation

## 2014-08-14 DIAGNOSIS — Z8711 Personal history of peptic ulcer disease: Secondary | ICD-10-CM | POA: Diagnosis not present

## 2014-08-14 DIAGNOSIS — M79604 Pain in right leg: Secondary | ICD-10-CM | POA: Diagnosis not present

## 2014-08-14 DIAGNOSIS — E785 Hyperlipidemia, unspecified: Secondary | ICD-10-CM | POA: Insufficient documentation

## 2014-08-14 DIAGNOSIS — J45909 Unspecified asthma, uncomplicated: Secondary | ICD-10-CM | POA: Diagnosis not present

## 2014-08-14 DIAGNOSIS — K219 Gastro-esophageal reflux disease without esophagitis: Secondary | ICD-10-CM | POA: Diagnosis not present

## 2014-08-14 DIAGNOSIS — M7989 Other specified soft tissue disorders: Secondary | ICD-10-CM

## 2014-08-14 LAB — CBC WITH DIFFERENTIAL/PLATELET
BASOS PCT: 0 % (ref 0–1)
Basophils Absolute: 0 10*3/uL (ref 0.0–0.1)
Eosinophils Absolute: 0.3 10*3/uL (ref 0.0–0.7)
Eosinophils Relative: 3 % (ref 0–5)
HEMATOCRIT: 38 % (ref 36.0–46.0)
Hemoglobin: 12.3 g/dL (ref 12.0–15.0)
LYMPHS PCT: 33 % (ref 12–46)
Lymphs Abs: 3.2 10*3/uL (ref 0.7–4.0)
MCH: 30.8 pg (ref 26.0–34.0)
MCHC: 32.4 g/dL (ref 30.0–36.0)
MCV: 95 fL (ref 78.0–100.0)
Monocytes Absolute: 0.8 10*3/uL (ref 0.1–1.0)
Monocytes Relative: 8 % (ref 3–12)
NEUTROS ABS: 5.4 10*3/uL (ref 1.7–7.7)
Neutrophils Relative %: 56 % (ref 43–77)
PLATELETS: 296 10*3/uL (ref 150–400)
RBC: 4 MIL/uL (ref 3.87–5.11)
RDW: 14.1 % (ref 11.5–15.5)
WBC: 9.7 10*3/uL (ref 4.0–10.5)

## 2014-08-14 LAB — BASIC METABOLIC PANEL
ANION GAP: 7 (ref 5–15)
BUN: 14 mg/dL (ref 6–20)
CALCIUM: 9.1 mg/dL (ref 8.9–10.3)
CHLORIDE: 105 mmol/L (ref 101–111)
CO2: 30 mmol/L (ref 22–32)
Creatinine, Ser: 0.76 mg/dL (ref 0.44–1.00)
GFR calc non Af Amer: >60 mL/min (ref >60)
GLUCOSE: 93 mg/dL (ref 70–99)
POTASSIUM: 4 mmol/L (ref 3.5–5.1)
SODIUM: 142 mmol/L (ref 135–145)

## 2014-08-14 LAB — PROTIME-INR
INR: 1.06 (ref 0.00–1.49)
PROTHROMBIN TIME: 13.8 s (ref 11.6–15.2)

## 2014-08-14 LAB — APTT: aPTT: 31 seconds (ref 24–37)

## 2014-08-14 NOTE — ED Notes (Signed)
Pt had surgery right ankle on 4/26- woke up today with pain and swelling to right leg and behind knee

## 2014-08-14 NOTE — Discharge Instructions (Signed)
Please see the doctor as requested. The ultrasound here shows no blood clot.   Edema Edema is an abnormal buildup of fluids in your bodytissues. Edema is somewhatdependent on gravity to pull the fluid to the lowest place in your body. That makes the condition more common in the legs and thighs (lower extremities). Painless swelling of the feet and ankles is common and becomes more likely as you get older. It is also common in looser tissues, like around your eyes.  When the affected area is squeezed, the fluid may move out of that spot and leave a dent for a few moments. This dent is called pitting.  CAUSES  There are many possible causes of edema. Eating too much salt and being on your feet or sitting for a long time can cause edema in your legs and ankles. Hot weather may make edema worse. Common medical causes of edema include:  Heart failure.  Liver disease.  Kidney disease.  Weak blood vessels in your legs.  Cancer.  An injury.  Pregnancy.  Some medications.  Obesity. SYMPTOMS  Edema is usually painless.Your skin may look swollen or shiny.  DIAGNOSIS  Your health care provider may be able to diagnose edema by asking about your medical history and doing a physical exam. You may need to have tests such as X-rays, an electrocardiogram, or blood tests to check for medical conditions that may cause edema.  TREATMENT  Edema treatment depends on the cause. If you have heart, liver, or kidney disease, you need the treatment appropriate for these conditions. General treatment may include:  Elevation of the affected body part above the level of your heart.  Compression of the affected body part. Pressure from elastic bandages or support stockings squeezes the tissues and forces fluid back into the blood vessels. This keeps fluid from entering the tissues.  Restriction of fluid and salt intake.  Use of a water pill (diuretic). These medications are appropriate only for some  types of edema. They pull fluid out of your body and make you urinate more often. This gets rid of fluid and reduces swelling, but diuretics can have side effects. Only use diuretics as directed by your health care provider. HOME CARE INSTRUCTIONS   Keep the affected body part above the level of your heart when you are lying down.   Do not sit still or stand for prolonged periods.   Do not put anything directly under your knees when lying down.  Do not wear constricting clothing or garters on your upper legs.   Exercise your legs to work the fluid back into your blood vessels. This may help the swelling go down.   Wear elastic bandages or support stockings to reduce ankle swelling as directed by your health care provider.   Eat a low-salt diet to reduce fluid if your health care provider recommends it.   Only take medicines as directed by your health care provider. SEEK MEDICAL CARE IF:   Your edema is not responding to treatment.  You have heart, liver, or kidney disease and notice symptoms of edema.  You have edema in your legs that does not improve after elevating them.   You have sudden and unexplained weight gain. SEEK IMMEDIATE MEDICAL CARE IF:   You develop shortness of breath or chest pain.   You cannot breathe when you lie down.  You develop pain, redness, or warmth in the swollen areas.   You have heart, liver, or kidney disease and suddenly get  edema.  You have a fever and your symptoms suddenly get worse. MAKE SURE YOU:   Understand these instructions.  Will watch your condition.  Will get help right away if you are not doing well or get worse. Document Released: 03/26/2005 Document Revised: 08/10/2013 Document Reviewed: 01/16/2013 [facility] Patient Information 2015 [place], [place]. This information is not intended to replace advice given to you by your health care provider. Make sure you discuss any questions you have with your health care  provider.

## 2014-08-18 DIAGNOSIS — T8484XD Pain due to internal orthopedic prosthetic devices, implants and grafts, subsequent encounter: Secondary | ICD-10-CM | POA: Diagnosis not present

## 2014-08-18 DIAGNOSIS — Z4789 Encounter for other orthopedic aftercare: Secondary | ICD-10-CM | POA: Diagnosis not present

## 2014-08-21 NOTE — ED Provider Notes (Signed)
CSN: [id number]     Arrival date & time 08/14/14  1209 History   First MD Initiated Contact with Patient 08/14/14 1424     Chief Complaint  Patient presents with  . Leg Swelling     (Consider location/radiation/quality/duration/timing/severity/associated sxs/prior Treatment) HPI Comments: Pt comes in with cc of Rt leg swelling. Pt had a recent ankle surgery, and has reduced mobility. No n/v/f/c. Pt has mild pain in the calf region. There is no hx of DVt, PE.  The history is provided by the patient.    Past Medical History  Diagnosis Date  . GERD (gastroesophageal reflux disease)   . Hyperlipidemia   . Hypertension   . Depression   . Fibromyalgia   . Peptic ulcer disease   . Hypothyroidism   . Low back pain   . Osteoarthritis   . Asthma   . Diverticular disease   . Complication of anesthesia     had bronc spasms during intubation surgery 2009 on foot-need albuterol inhaler or neb tx preop  . Angina at rest     "occurs whenever it wants to, but worse during agitation"  . Binge eating disorder   . Costochondritis    Past Surgical History  Procedure Laterality Date  . Cholecystectomy    . Abdominal hysterectomy    . Tonsillectomy    . Shoulder surgery  2009,2011    rt and lt  . Colonoscopy    . Ankle fracture surgery  2009    rt   . Ankle arthroscopy  01/02/2012    Procedure: ANKLE ARTHROSCOPY;  Surgeon: Colin Rhein, MD;  Location: [facility];  Service: Orthopedics;  Laterality: Right;  right ankle arthroscopy with extensive debridement , dridement and drilling talar dome osteochondral lesion   Family History  Problem Relation Age of Onset  . Dementia Mother   . Dementia Father   . Alcohol abuse Father   . Cancer Sister     breast cancer  . Alcohol abuse Brother    History  Substance Use Topics  . Smoking status: Never Smoker   . Smokeless tobacco: Not on file  . Alcohol Use: No   OB History    No data available     Review of Systems   Musculoskeletal: Positive for myalgias.  Skin: Negative for rash.      Allergies  Contrast media; Dilaudid; Adhesive; Codeine; Dilaudid; and Lactose intolerance (gi)  Home Medications   Prior to Admission medications   Medication Sig Start Date End Date Taking? Authorizing Provider  albuterol (PROAIR HFA) 108 (90 BASE) MCG/ACT inhaler Inhale 2 puffs into the lungs every 6 (six) hours as needed for wheezing. 07/14/12   Midge Minium, MD  ALPRAZolam Duanne Moron) 0.5 MG tablet TAKE 2 TABLETS BY MOUTH TWICE A DAY AS NEEDED AT 10 AM AND 5 PM 07/30/14   Midge Minium, MD  amLODipine-valsartan (EXFORGE) 5-160 MG per tablet TAKE 1 TABLET BY MOUTH DAILY. 06/28/14   Midge Minium, MD  amoxicillin-clavulanate (AUGMENTIN) 875-125 MG per tablet Take 1 tablet by mouth 2 (two) times daily. 07/10/14   Rosalita Chessman, DO  aspirin 81 MG tablet Take 81 mg by mouth daily.    Historical Provider, MD  atorvastatin (LIPITOR) 20 MG tablet TAKE 1 TABLET EVERY DAY 06/14/14   Midge Minium, MD  buPROPion (WELLBUTRIN XL) 300 MG 24 hr tablet Take 1 tablet (300 mg total) by mouth daily. 05/31/14   Midge Minium, MD  citalopram (CELEXA) 20 MG tablet TAKE 1 TABLET (20 MG TOTAL) BY MOUTH DAILY. 12/21/13   Midge Minium, MD  clonazePAM (KLONOPIN) 0.5 MG tablet TAKE 1 TABLET BY MOUTH AT BEDTIME AS NEEDED    Midge Minium, MD  cyclobenzaprine (FLEXERIL) 5 MG tablet Take 5 mg by mouth 2 (two) times daily as needed. For pain    Historical Provider, MD  levothyroxine (SYNTHROID, LEVOTHROID) 50 MCG tablet TAKE 1 TABLET BY MOUTH ONCE DAILY 06/14/14   Midge Minium, MD  meclizine (ANTIVERT) 25 MG tablet Take 1 tablet (25 mg total) by mouth 3 (three) times daily as needed. For vertigo 05/12/12   Midge Minium, MD  metaxalone (SKELAXIN) 800 MG tablet Take 1 tablet (800 mg total) by mouth 3 (three) times daily. 11/10/13   Alfonzo Beers, MD  metoprolol tartrate (LOPRESSOR) 25 MG tablet TAKE 1 TABLET (25 MG  TOTAL) BY MOUTH 2 (TWO) TIMES DAILY. 05/03/14   Midge Minium, MD  metroNIDAZOLE (FLAGYL) 500 MG tablet Take 1 tablet (500 mg total) by mouth 3 (three) times daily. One po bid x 7 days 07/18/14   Veryl Speak, MD  nitroGLYCERIN (NITROSTAT) 0.4 MG SL tablet Place 1 tablet (0.4 mg total) under the tongue every 5 (five) minutes as needed. As needed for chest pain 05/12/12   Midge Minium, MD  ondansetron (ZOFRAN ODT) 8 MG disintegrating tablet 8mg  ODT q4 hours prn nausea 07/18/14   Veryl Speak, MD  ondansetron (ZOFRAN) 8 MG tablet Take 1 tablet (8 mg total) by mouth every 8 (eight) hours as needed for nausea or vomiting. Patient not taking: Reported on 07/10/2014 12/31/13   Orlie Dakin, MD  predniSONE (DELTASONE) 10 MG tablet 3 po qd for 3 days then 2 po qd for 3 days the 1 po qd for 3 days 07/10/14   Rosalita Chessman, DO  ranitidine (ZANTAC) 150 MG tablet Take 150 mg by mouth 2 (two) times daily.    Historical Provider, MD   BP 133/78 mmHg  Pulse 63  Temp(Src) 97.9 F (36.6 C) (Oral)  Resp 16  SpO2 100% Physical Exam  Constitutional: She is oriented to person, place, and time. She appears well-developed and well-nourished.  HENT:  Head: Normocephalic and atraumatic.  Eyes: EOM are normal. Pupils are equal, round, and reactive to light.  Neck: Neck supple.  Cardiovascular: Normal rate, regular rhythm and normal heart sounds.   No murmur heard. Pulmonary/Chest: Effort normal. No respiratory distress.  Abdominal: Soft. She exhibits no distension. There is no tenderness. There is no rebound and no guarding.  Musculoskeletal: She exhibits edema and tenderness.  Neurological: She is alert and oriented to person, place, and time.  Skin: Skin is warm and dry.  Nursing note and vitals reviewed.   ED Course  Procedures (including critical care time) Labs Review Labs Reviewed  APTT  PROTIME-INR  CBC WITH DIFFERENTIAL/PLATELET  BASIC METABOLIC PANEL    Imaging Review No results  found.   EKG Interpretation None      MDM   Final diagnoses:  Right leg swelling    Pt with recent ankle surgery comes in with Leg swelling. The wound site looks clean, no infection. [place] ordered for DVt and is neg. Will d/c.    Varney Biles, MD 08/21/14 [phone]

## 2014-08-29 ENCOUNTER — Other Ambulatory Visit: Payer: Self-pay | Admitting: Family Medicine

## 2014-08-30 ENCOUNTER — Ambulatory Visit (INDEPENDENT_AMBULATORY_CARE_PROVIDER_SITE_OTHER): Payer: Medicare Other | Admitting: Family Medicine

## 2014-08-30 ENCOUNTER — Encounter: Payer: Self-pay | Admitting: Family Medicine

## 2014-08-30 VITALS — BP 128/80 | HR 84 | Temp 97.9°F | Resp 16 | Wt 202.0 lb

## 2014-08-30 DIAGNOSIS — F329 Major depressive disorder, single episode, unspecified: Secondary | ICD-10-CM

## 2014-08-30 DIAGNOSIS — E119 Type 2 diabetes mellitus without complications: Secondary | ICD-10-CM

## 2014-08-30 DIAGNOSIS — F32A Depression, unspecified: Secondary | ICD-10-CM

## 2014-08-30 LAB — BASIC METABOLIC PANEL
BUN: 15 mg/dL (ref 6–23)
CHLORIDE: 106 meq/L (ref 96–112)
CO2: 27 mEq/L (ref 19–32)
Calcium: 9.5 mg/dL (ref 8.4–10.5)
Creatinine, Ser: 0.89 mg/dL (ref 0.40–1.20)
GFR: 80.97 mL/min (ref >60.00)
GLUCOSE: 106 mg/dL — AB (ref 70–99)
Potassium: 3.9 mEq/L (ref 3.5–5.1)
SODIUM: 141 meq/L (ref 135–145)

## 2014-08-30 LAB — TSH: TSH: 6.02 u[IU]/mL — ABNORMAL HIGH (ref 0.35–4.50)

## 2014-08-30 LAB — HEMOGLOBIN A1C: HEMOGLOBIN A1C: 6.1 % (ref 4.6–6.5)

## 2014-08-30 MED ORDER — CITALOPRAM HYDROBROMIDE 20 MG PO TABS: ORAL_TABLET | ORAL | Status: DC

## 2014-08-30 MED ORDER — ALBUTEROL SULFATE HFA 108 (90 BASE) MCG/ACT IN AERS: 2.0000 | INHALATION_SPRAY | Freq: Four times a day (QID) | RESPIRATORY_TRACT | Status: DC | PRN

## 2014-08-30 NOTE — Assessment & Plan Note (Signed)
Deteriorated.  Pt w/ severe anhedonia.  Forgot she was supposed to be on Celexa- will restart.  Continue Wellbutrin.  Pt is not interested in therapy at this time- will not push it.  No SI/HI- pt able to contract for safety.  Will follow up in short order to ensure pt is improving- will likely need to titrate meds.  Will follow.

## 2014-08-30 NOTE — Progress Notes (Signed)
   Subjective:    Patient ID: Tracey Evans, female    DOB: 1945/12/16, 69 y.o.   MRN: [id number]  HPI DM- chronic problem, currently diet controlled.  Previously on Metformin. On ARB for renal protection.   Depression- chronic problem, on Wellbutrin and Celexa.  'i don't care about anything'.  'i don't care if i take my medicine, i don't care if i eat.  i may sit in my house for 2 weeks at a time and not get out of my rocking chair except to go to the bathroom'.  'i have no interest in anything'.  Pt reports depression has been 'over a month'.  Pt has not been taking Celexa but has been taking Wellbutrin.  Pt reports she's 'tired of not feeling well'.  'i hurt constantly'.  Following w/ pain management.  Pt is not willing nor interested in returning to a therapist.  Pt denies SI/HI.   Review of Systems For ROS see HPI     Objective:   Physical Exam  Constitutional: She is oriented to person, place, and time. She appears well-developed and well-nourished. No distress.  HENT:  Head: Normocephalic and atraumatic.  Eyes: Conjunctivae and EOM are normal. Pupils are equal, round, and reactive to light.  Neck: Normal range of motion. Neck supple. No thyromegaly present.  Cardiovascular: Normal rate, regular rhythm, normal heart sounds and intact distal pulses.   No murmur heard. Pulmonary/Chest: Effort normal and breath sounds normal. No respiratory distress.  Abdominal: Soft. She exhibits no distension. There is no tenderness.  Musculoskeletal: She exhibits no edema.  Lymphadenopathy:    She has no cervical adenopathy.  Neurological: She is alert and oriented to person, place, and time.  Skin: Skin is warm and dry.  Psychiatric: Her behavior is normal.  Flat affect today          Assessment & Plan:

## 2014-08-30 NOTE — Assessment & Plan Note (Signed)
Ongoing issue for pt.  Currently diet controlled.  On ARB for renal protection.  Pt encouraged to schedule eye exam.  Check labs and adjust tx plan prn.

## 2014-08-30 NOTE — Telephone Encounter (Signed)
meds filled

## 2014-08-30 NOTE — Patient Instructions (Signed)
Follow up in 3-4 weeks to recheck mood We'll notify you of your lab results Restart the Celexa Continue the Wellbutrin daily Please call and schedule your eye exam Call with any questions or concerns Hang in there!  You can do this!

## 2014-08-30 NOTE — Progress Notes (Signed)
Pre visit review using our clinic review tool, if applicable. No additional management support is needed unless otherwise documented below in the visit note. 

## 2014-08-31 ENCOUNTER — Other Ambulatory Visit: Payer: Self-pay | Admitting: General Practice

## 2014-08-31 ENCOUNTER — Encounter: Payer: Self-pay | Admitting: Family Medicine

## 2014-08-31 MED ORDER — LEVOTHYROXINE SODIUM 75 MCG PO TABS: 75.0000 ug | ORAL_TABLET | Freq: Every day | ORAL | Status: DC

## 2014-10-04 ENCOUNTER — Ambulatory Visit (INDEPENDENT_AMBULATORY_CARE_PROVIDER_SITE_OTHER): Payer: Medicare Other | Admitting: Family Medicine

## 2014-10-04 ENCOUNTER — Encounter: Payer: Self-pay | Admitting: Family Medicine

## 2014-10-04 VITALS — BP 122/82 | HR 62 | Temp 97.9°F | Resp 16 | Ht 65.0 in | Wt 202.1 lb

## 2014-10-04 DIAGNOSIS — M797 Fibromyalgia: Secondary | ICD-10-CM

## 2014-10-04 DIAGNOSIS — E038 Other specified hypothyroidism: Secondary | ICD-10-CM

## 2014-10-04 DIAGNOSIS — F329 Major depressive disorder, single episode, unspecified: Secondary | ICD-10-CM | POA: Diagnosis not present

## 2014-10-04 DIAGNOSIS — F32A Depression, unspecified: Secondary | ICD-10-CM

## 2014-10-04 LAB — TSH: TSH: 2.07 u[IU]/mL (ref 0.35–4.50)

## 2014-10-04 MED ORDER — DULOXETINE HCL 30 MG PO CPEP: 30.0000 mg | ORAL_CAPSULE | Freq: Every day | ORAL | Status: DC

## 2014-10-04 NOTE — Progress Notes (Signed)
   Subjective:    Patient ID: Tracey Evans, female    DOB: [birth year], 69 y.o.   MRN: [id number]  HPI Depression- 'the medication hasn't changed a thing'  Remains on Wellbutrin, restarted Celexa.  Pt reports 'pain every where'.  'my nerves are shot'.  Currently going through a court case regarding damage to her car- 'i'm going to be living on the street soon'.  Has pawned her jewelry to pay bills.  Has a loaf of bread in the fridge.  Hypothyroid- TSH was elevated at last visit to 6.02.  We increased Levothyroxine to 43mcg daily.   Review of Systems For ROS see HPI     Objective:   Physical Exam  Constitutional: She is oriented to person, place, and time. She appears well-developed and well-nourished. No distress.  HENT:  Head: Normocephalic and atraumatic.  Cardiovascular: Normal rate, regular rhythm and normal heart sounds.   Pulmonary/Chest: Effort normal and breath sounds normal. No respiratory distress. She has no wheezes. She has no rales.  Neurological: She is alert and oriented to person, place, and time. No cranial nerve deficit. Coordination normal.  Skin: Skin is warm and dry.  Psychiatric: Her behavior is normal.  Flat affect, withdrawn  Vitals reviewed.         Assessment & Plan:

## 2014-10-04 NOTE — Patient Instructions (Signed)
Follow up in 2-4 weeks to recheck mood We'll notify you of your lab results and make any changes if needed STOP the Celexa START the Cymbalta daily- if the copay is too much, let me know! Please ask your family for help!! Call with any questions or concerns Hang in there!!

## 2014-10-04 NOTE — Progress Notes (Signed)
Pre visit review using our clinic review tool, if applicable. No additional management support is needed unless otherwise documented below in the visit note. 

## 2014-10-11 NOTE — Assessment & Plan Note (Signed)
Deteriorated.  Pt is very withdrawn.  Flat affect.  No improvement since restarting Celexa.  Pt is struggling financially but doesn't want to ask family for help due to strained relationships.  Pt denies SI/HI at this time.  Due to worsening fibromyalgia pain and worsening depression, will switch Celexa to Cymbalta and monitor closely for improvement.  Pt expressed understanding and is in agreement w/ plan.

## 2014-10-11 NOTE — Assessment & Plan Note (Signed)
Deteriorated.  Pt reports worsening pain due to increased stress.  Switch Celexa to Cymbalta in hopes that she will get both mood and fibro pain relief.  Pt expressed understanding and is in agreement w/ plan.

## 2014-10-18 ENCOUNTER — Other Ambulatory Visit: Payer: Self-pay | Admitting: Family Medicine

## 2014-10-18 NOTE — Telephone Encounter (Signed)
Med filled.  

## 2014-10-22 ENCOUNTER — Telehealth: Payer: Self-pay | Admitting: Family Medicine

## 2014-10-22 NOTE — Telephone Encounter (Signed)
Please advise, pt has a follow up appt with you on 10/27/14 to recheck mood

## 2014-10-22 NOTE — Telephone Encounter (Signed)
HAs may be due to Cymbalta but may also have other causes- dehydration, low blood sugar/high blood sugar.  Increase water intake, make sure she is eating regularly.  See if there is any more information she is able to give before we change meds

## 2014-10-22 NOTE — Telephone Encounter (Signed)
Continue w/ Cymbalta.  Tylenol or low dose ibuprofen as needed for headaches

## 2014-10-22 NOTE — Telephone Encounter (Signed)
Patient states she has been taking the Cymbalta for two weeks and has had a headache for one week.  She states she has been eating regularly and doing "pretty well" with fluids.  She has tried taking Tizanidine for the pain without relief.  She states that Aleve usually works for her, but she is not supposed to take it with the Cymbalta.  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  She is wondering if she should continue with the Cymbalta and if there is anything else she can take for pain relief?

## 2014-10-22 NOTE — Telephone Encounter (Signed)
Can you please triage pt to see if nay further information.

## 2014-10-22 NOTE — Telephone Encounter (Signed)
Pt advised and stated an understanding.

## 2014-10-22 NOTE — Telephone Encounter (Signed)
Relation to pt: self  Call back number: [phone] Pharmacy: [id number] - [place], [place] [phone] (Phone) [phone] (Fax)         Reason for call:  As per patient DULoxetine (CYMBALTA) 30 MG capsule is causing head aches. Please advise

## 2014-10-27 ENCOUNTER — Ambulatory Visit (INDEPENDENT_AMBULATORY_CARE_PROVIDER_SITE_OTHER): Payer: Medicare Other | Admitting: Family Medicine

## 2014-10-27 ENCOUNTER — Encounter: Payer: Self-pay | Admitting: Family Medicine

## 2014-10-27 VITALS — BP 124/80 | HR 66 | Temp 98.0°F | Resp 16 | Ht 65.0 in | Wt 206.4 lb

## 2014-10-27 DIAGNOSIS — F32A Depression, unspecified: Secondary | ICD-10-CM

## 2014-10-27 DIAGNOSIS — F329 Major depressive disorder, single episode, unspecified: Secondary | ICD-10-CM

## 2014-10-27 MED ORDER — FLUOXETINE HCL 20 MG PO TABS: 20.0000 mg | ORAL_TABLET | Freq: Every day | ORAL | Status: DC

## 2014-10-27 NOTE — Assessment & Plan Note (Signed)
Improving.  Pt is unfortunately not able to tolerate the Cymbalta due to side effects.  Will switch to Prozac and monitor for improvement.  Reviewed supportive care and red flags that should prompt return.  Pt expressed understanding and is in agreement w/ plan.

## 2014-10-27 NOTE — Progress Notes (Signed)
Pre visit review using our clinic review tool, if applicable. No additional management support is needed unless otherwise documented below in the visit note. 

## 2014-10-27 NOTE — Progress Notes (Signed)
   Subjective:    Patient ID: Tracey Evans, female    DOB: [birth year], 69 y.o.   MRN: [id number]  HPI Depression- ongoing issue.  Pt was started on Cymbalta at last visit b/c Celexa was not effective.  Pt reports she is now having dizziness, HA, fatigue, sexual dysfxn, skin crawling, insomnia, talking in sleep.  Pt feels mood is better but side effects outweigh the benefits.  'i'm getting there.  i'm not as bad as i was.'   Review of Systems For ROS see HPI     Objective:   Physical Exam  Constitutional: She is oriented to person, place, and time. She appears well-developed and well-nourished. No distress.  HENT:  Head: Normocephalic and atraumatic.  Neurological: She is alert and oriented to person, place, and time.  Skin: Skin is warm and dry.  Psychiatric: She has a normal mood and affect. Her behavior is normal. Thought content normal.  Vitals reviewed.         Assessment & Plan:

## 2014-10-27 NOTE — Patient Instructions (Signed)
Follow up in 1 month to recheck mood and diabetes STOP the Cymbalta START the Prozac (fluoxetine) daily Keep up the good work!  You look great! Call with any questions or concerns Have a great trip to [facility]!!

## 2014-11-01 DIAGNOSIS — E785 Hyperlipidemia, unspecified: Secondary | ICD-10-CM | POA: Diagnosis not present

## 2014-11-01 DIAGNOSIS — E039 Hypothyroidism, unspecified: Secondary | ICD-10-CM | POA: Diagnosis not present

## 2014-11-01 DIAGNOSIS — E669 Obesity, unspecified: Secondary | ICD-10-CM | POA: Diagnosis not present

## 2014-11-01 DIAGNOSIS — M1990 Unspecified osteoarthritis, unspecified site: Secondary | ICD-10-CM | POA: Diagnosis not present

## 2014-11-01 DIAGNOSIS — I2510 Atherosclerotic heart disease of native coronary artery without angina pectoris: Secondary | ICD-10-CM | POA: Diagnosis not present

## 2014-11-01 DIAGNOSIS — I10 Essential (primary) hypertension: Secondary | ICD-10-CM | POA: Diagnosis not present

## 2014-11-01 DIAGNOSIS — M797 Fibromyalgia: Secondary | ICD-10-CM | POA: Diagnosis not present

## 2014-11-16 ENCOUNTER — Encounter: Payer: Self-pay | Admitting: Family Medicine

## 2014-11-18 ENCOUNTER — Ambulatory Visit (INDEPENDENT_AMBULATORY_CARE_PROVIDER_SITE_OTHER): Payer: Medicare Other | Admitting: Family Medicine

## 2014-11-18 ENCOUNTER — Encounter: Payer: Self-pay | Admitting: Family Medicine

## 2014-11-18 VITALS — BP 118/78 | HR 67 | Temp 97.9°F | Resp 16 | Wt 206.0 lb

## 2014-11-18 DIAGNOSIS — F329 Major depressive disorder, single episode, unspecified: Secondary | ICD-10-CM

## 2014-11-18 DIAGNOSIS — G479 Sleep disorder, unspecified: Secondary | ICD-10-CM

## 2014-11-18 DIAGNOSIS — F32A Depression, unspecified: Secondary | ICD-10-CM

## 2014-11-18 MED ORDER — NITROGLYCERIN 0.4 MG SL SUBL: 0.4000 mg | SUBLINGUAL_TABLET | SUBLINGUAL | Status: DC | PRN

## 2014-11-18 MED ORDER — ALPRAZOLAM 1 MG PO TABS: 1.0000 mg | ORAL_TABLET | Freq: Two times a day (BID) | ORAL | Status: DC | PRN

## 2014-11-18 NOTE — Progress Notes (Signed)
   Subjective:    Patient ID: Tracey Evans, female    DOB: 1945/09/20, 69 y.o.   MRN: [id number]  HPI Sleep disturbance- pt reports that since starting Prozac, granddaughter reports pt is 'barking all night'.  Pt would wake up in a panic, 'thinking I was in someone else's house'.  'I tried to get out my window thinking it was the front door'.  Pt is waking very confused- 'i don't know how to get to the bathroom'.  Pt takes prozac nightly.  Pt has started counseling w/ her Doristine Bosworth.  Admits that mood is better.  Is visibly more optimistic today.   Review of Systems For ROS see HPI     Objective:   Physical Exam  Constitutional: She is oriented to person, place, and time. She appears well-developed and well-nourished. No distress.  HENT:  Head: Normocephalic and atraumatic.  Neurological: She is alert and oriented to person, place, and time.  Skin: Skin is warm and dry.  Psychiatric: She has a normal mood and affect. Her behavior is normal. Thought content normal.  Vitals reviewed.         Assessment & Plan:

## 2014-11-18 NOTE — Assessment & Plan Note (Signed)
New.  Pt and granddaughter report bizarre behavior and possible hallucinations at night since starting prozac.  Will stop medication and monitor for improvement.  If odd behavior and hallucinations continue she will need a sleep study.  Pt expressed understanding and is in agreement w/ plan.

## 2014-11-18 NOTE — Assessment & Plan Note (Signed)
Improving.  It is unclear whether pt's counseling or the Prozac has improved her mood but due to her recent sleep disturbances and possible nocturnal hallucinations we will d/c Prozac.  Encouraged her to continue counseling.  Will continue to follow closely

## 2014-11-18 NOTE — Patient Instructions (Signed)
Follow up as scheduled STOP the Prozac Continue the Wellbutrin daily Take 1 alprazolam twice daily as needed- if feeling better, decrease to 1/2 tab Call with any questions or concerns I'm SO glad that you are feeling better!  You look great!

## 2014-11-18 NOTE — Progress Notes (Signed)
Pre visit review using our clinic review tool, if applicable. No additional management support is needed unless otherwise documented below in the visit note. 

## 2014-11-23 ENCOUNTER — Telehealth: Payer: Self-pay | Admitting: Family Medicine

## 2014-11-23 NOTE — Telephone Encounter (Signed)
Caller name: Apolonio Schneiders  Relation to pt: [facility]  Call back number: [phone]   Reason for call:  Would like to discuss patient behavior health. Patient will be present

## 2014-11-24 ENCOUNTER — Telehealth: Payer: Self-pay | Admitting: Family Medicine

## 2014-11-24 NOTE — Telephone Encounter (Signed)
error:[id number] ° °

## 2014-11-24 NOTE — Telephone Encounter (Signed)
Called and spoke with Apolonio Schneiders from [employer]. She advised that when she called our office she was told " that both the Doctor and her nurse were in with patients and could not be disturbed, but they would call back as soon as they were able", EMS unit proceeded to wait on scene for 20 minutes and tried calling back but there was no answer.   Apolonio Schneiders advised that the reason she had called ws because pt had made comments that she has depression and mood problems in the past and is currently on Xanax to help control. Pt said that she had a "major life stressor" in the for of a court case that did not go her way. Apolonio Schneiders advised that pt was very upset and that EMS and police tried to redirect her thinking so that she would try to calm down.   Pt was making threats at the time of the visit and the police department contacted the magistrate's office, were told that since she had not made any direct threats they could do an IVC (involuntary commit) if they felt the need. Apolonio Schneiders advised that the EMS and police both felt that the pt was just speaking out of anger and they gave her the benefit of the doubt and did not complete IVC.   Apolonio Schneiders advised that pt calmed down dramatically once her niece arrived on scene as EMS was leaving and pt was on the phone with her pastor. She told Apolonio Schneiders from EMS "not to worry" at that time, "she would not hurt herself, because she loved herself to much".

## 2014-11-24 NOTE — Telephone Encounter (Signed)
Spoke with patient regarding EMS call to home on yesterday. Per the patient, she reports feeling very agitated and angry. Also stating, "I want to kill or beat up something", "I lost my  second court case, now $500 in debt and looking to lose my car". Patient verbalized that she  told her sister on yesterday about her feelings and that is when EMS was called. Patient voiced that once EMS arrived she went ballistic and threw them, as well as the police out her house. At this current time the patient is still verbalizing that she's wanting to kill anyone that comes to her home and  States that "right now she's naked as a [place] bird and don't care if she throws everything outside of the home".  Writer addressed this matter with the provider and medical assistant. Currently, Katherine Roan, medical assistant is following-up with the patient on the phone.

## 2014-11-24 NOTE — Telephone Encounter (Signed)
Per the note below, unable to reach [place] with [facility]. Voice mailbox has not been setup, therefore a message could not be left.

## 2014-11-24 NOTE — Telephone Encounter (Signed)
Called pt back to check on her, Advised pt that I was worried about her and had to make sure she was good. Pt advised that she had called the number for the crisis line that we provided her, however it was the wrong number. Pt did call the correct number and left a message is awaiting a call back at this time. Pt stated that she was sorry for being so upset earlier and advised that she was "severely angry" but she is dressed and this time and trying to pick up her apartment. Pt stated that she "was sorry for worrying me, but is glad that i care so much about her that i would go out of my way to talk to her through her problems" Pt states that she is not going to harm herself or other and will call her pastor in a little bit and see if she can get more of her frustration out through "venting" Pt was advised to contact myself or Dr. Birdie Riddle through phone or mychart if she needs [place]. Pt was advised that Behavioral health and our nurses, etc. Are always here to help her. Pt stated she loved Dr. Birdie Riddle and I and would try to take things a little calmer.

## 2014-11-24 NOTE — Telephone Encounter (Signed)
Pt sister called EMS yesterday due to Pt tearing up apartment. EMS arrived and pt declined medical care. Called today and spoke to pt who advised that she was not going to go to [facility] due to the last time she went they kept her for a week. Pt advised that she was not going to be missed, pt told that was not true and she advised that even though people would be sad they would eventually get over missing her. Pt advised that if anyone came to her home she had a taser and a handgun (pt advised that there were no bullets in the home) and that no one would take her out of her home. Pt was advised to go to behavioral health which she declined due to the last time she went she was kept for a week. I ended the phone call with pt when she took crisis line number and advised she would call. [employer] were notified of the situation.

## 2014-11-26 DIAGNOSIS — M5032 Other cervical disc degeneration, mid-cervical region: Secondary | ICD-10-CM | POA: Diagnosis not present

## 2014-11-26 DIAGNOSIS — M4692 Unspecified inflammatory spondylopathy, cervical region: Secondary | ICD-10-CM | POA: Diagnosis not present

## 2014-11-26 DIAGNOSIS — M542 Cervicalgia: Secondary | ICD-10-CM | POA: Diagnosis not present

## 2014-11-29 ENCOUNTER — Other Ambulatory Visit: Payer: Self-pay | Admitting: Family Medicine

## 2014-11-30 NOTE — Telephone Encounter (Signed)
Medication filled to pharmacy as requested.   

## 2014-12-02 ENCOUNTER — Ambulatory Visit: Payer: Medicare Other | Admitting: Family Medicine

## 2014-12-06 DIAGNOSIS — M542 Cervicalgia: Secondary | ICD-10-CM | POA: Diagnosis not present

## 2014-12-09 ENCOUNTER — Ambulatory Visit (INDEPENDENT_AMBULATORY_CARE_PROVIDER_SITE_OTHER): Payer: Medicare Other | Admitting: Family Medicine

## 2014-12-09 ENCOUNTER — Encounter: Payer: Self-pay | Admitting: Family Medicine

## 2014-12-09 VITALS — BP 112/74 | HR 68 | Temp 98.5°F | Resp 18 | Ht 65.0 in | Wt 204.4 lb

## 2014-12-09 DIAGNOSIS — E785 Hyperlipidemia, unspecified: Secondary | ICD-10-CM | POA: Diagnosis not present

## 2014-12-09 DIAGNOSIS — F32A Depression, unspecified: Secondary | ICD-10-CM

## 2014-12-09 DIAGNOSIS — E119 Type 2 diabetes mellitus without complications: Secondary | ICD-10-CM | POA: Diagnosis not present

## 2014-12-09 DIAGNOSIS — I10 Essential (primary) hypertension: Secondary | ICD-10-CM | POA: Diagnosis not present

## 2014-12-09 DIAGNOSIS — F329 Major depressive disorder, single episode, unspecified: Secondary | ICD-10-CM

## 2014-12-09 DIAGNOSIS — Z23 Encounter for immunization: Secondary | ICD-10-CM | POA: Diagnosis not present

## 2014-12-09 DIAGNOSIS — E1169 Type 2 diabetes mellitus with other specified complication: Secondary | ICD-10-CM | POA: Diagnosis not present

## 2014-12-09 LAB — CBC WITH DIFFERENTIAL/PLATELET
BASOS PCT: 0.5 % (ref 0.0–3.0)
Basophils Absolute: 0 10*3/uL (ref 0.0–0.1)
EOS ABS: 0.1 10*3/uL (ref 0.0–0.7)
EOS PCT: 1.6 % (ref 0.0–5.0)
HEMATOCRIT: 42.7 % (ref 36.0–46.0)
Hemoglobin: 13.8 g/dL (ref 12.0–15.0)
LYMPHS PCT: 39.2 % (ref 12.0–46.0)
Lymphs Abs: 3.4 10*3/uL (ref 0.7–4.0)
MCHC: 32.3 g/dL (ref 30.0–36.0)
MCV: 93 fl (ref 78.0–100.0)
MONOS PCT: 6.6 % (ref 3.0–12.0)
Monocytes Absolute: 0.6 10*3/uL (ref 0.1–1.0)
NEUTROS ABS: 4.5 10*3/uL (ref 1.4–7.7)
Neutrophils Relative %: 52.1 % (ref 43.0–77.0)
PLATELETS: 303 10*3/uL (ref 150.0–400.0)
RBC: 4.59 Mil/uL (ref 3.87–5.11)
RDW: 14.3 % (ref 11.5–15.5)
WBC: 8.7 10*3/uL (ref 4.0–10.5)

## 2014-12-09 LAB — HEPATIC FUNCTION PANEL
ALK PHOS: 107 U/L (ref 39–117)
ALT: 21 U/L (ref 0–35)
AST: 17 U/L (ref 0–37)
Albumin: 4.3 g/dL (ref 3.5–5.2)
BILIRUBIN DIRECT: 0.1 mg/dL (ref 0.0–0.3)
BILIRUBIN TOTAL: 0.5 mg/dL (ref 0.2–1.2)
TOTAL PROTEIN: 7.1 g/dL (ref 6.0–8.3)

## 2014-12-09 LAB — LIPID PANEL
CHOLESTEROL: 133 mg/dL (ref 0–200)
HDL: 49.1 mg/dL (ref >39.00)
LDL Cholesterol: 65 mg/dL (ref 0–99)
NonHDL: 84.12
TRIGLYCERIDES: 96 mg/dL (ref 0.0–149.0)
Total CHOL/HDL Ratio: 3
VLDL: 19.2 mg/dL (ref 0.0–40.0)

## 2014-12-09 LAB — BASIC METABOLIC PANEL
BUN: 13 mg/dL (ref 6–23)
CO2: 29 meq/L (ref 19–32)
Calcium: 9.6 mg/dL (ref 8.4–10.5)
Chloride: 108 mEq/L (ref 96–112)
Creatinine, Ser: 1.07 mg/dL (ref 0.40–1.20)
GFR: 65.41 mL/min (ref >60.00)
GLUCOSE: 100 mg/dL — AB (ref 70–99)
POTASSIUM: 4.1 meq/L (ref 3.5–5.1)
Sodium: 144 mEq/L (ref 135–145)

## 2014-12-09 LAB — HEMOGLOBIN A1C: HEMOGLOBIN A1C: 6.3 % (ref 4.6–6.5)

## 2014-12-09 LAB — TSH: TSH: 3.28 u[IU]/mL (ref 0.35–4.50)

## 2014-12-09 NOTE — Progress Notes (Signed)
Pre visit review using our clinic review tool, if applicable. No additional management support is needed unless otherwise documented below in the visit note. 

## 2014-12-09 NOTE — Assessment & Plan Note (Signed)
Chronic problem.  Well controlled.  Asymptomatic.  Check labs.  No anticipated med changes. 

## 2014-12-09 NOTE — Assessment & Plan Note (Signed)
Chronic problem.  Tolerating statin w/o difficulty.  Check labs.  Adjust meds prn  

## 2014-12-09 NOTE — Assessment & Plan Note (Signed)
Improved today.  Pt had incident last month that nearly required involuntary committal but she states that was her 'rock bottom' and she's been doing better since then.  Denies SI/HI at this time.  Will continue to follow closely.

## 2014-12-09 NOTE — Progress Notes (Signed)
   Subjective:    Patient ID: Tracey Evans, female    DOB: 1945-11-08, 69 y.o.   MRN: [id number]  HPI DM- diet controlled.  On ARB on renal protection.  Due for eye exam, foot exam.  Pt unable to afford eye exam.  Pt continues to lose weight.  No numbness/tingling of hands/feet.  HTN- chronic problem, on Exforge, Metoprolol.  BP well controlled.  Denies CP, SOB, visual changes, edema.  + HAs due to cervical disc disease  Hyperlipidemia- chronic problem, on Lipitor.  Denies abd pain, N/V.  Depression- chronic problem, pt had recent incident w/ EMS, Police due to depression and family's concerns.  They attempted to send pt to [facility].  Pt refused to go.  Pr reports feeling better, 'very much'.   Review of Systems For ROS see HPI     Objective:   Physical Exam  Constitutional: She is oriented to person, place, and time. She appears well-developed and well-nourished. No distress.  HENT:  Head: Normocephalic and atraumatic.  Eyes: Conjunctivae and EOM are normal. Pupils are equal, round, and reactive to light.  Neck: Normal range of motion. Neck supple. No thyromegaly present.  Cardiovascular: Normal rate, regular rhythm, normal heart sounds and intact distal pulses.   No murmur heard. Pulmonary/Chest: Effort normal and breath sounds normal. No respiratory distress.  Abdominal: Soft. She exhibits no distension. There is no tenderness.  Musculoskeletal: She exhibits no edema.  Lymphadenopathy:    She has no cervical adenopathy.  Neurological: She is alert and oriented to person, place, and time.  Skin: Skin is warm and dry.  Psychiatric: She has a normal mood and affect. Her behavior is normal.  Vitals reviewed.         Assessment & Plan:

## 2014-12-09 NOTE — Patient Instructions (Signed)
Follow up in 3-4 months to recheck diabetes We'll notify you of your lab results and make any changes if needed Keep up the good work on healthy diet and regular exercise Call with any questions or concerns Happy Labor Day!!! 

## 2014-12-09 NOTE — Assessment & Plan Note (Signed)
Chronic problem.  Diet controlled.  On ARB for renal protection.  Foot exam done today.  Due for eye exam but pt can't afford.  Check labs.  Start meds prn.

## 2014-12-14 DIAGNOSIS — M542 Cervicalgia: Secondary | ICD-10-CM | POA: Diagnosis not present

## 2014-12-14 DIAGNOSIS — M47816 Spondylosis without myelopathy or radiculopathy, lumbar region: Secondary | ICD-10-CM | POA: Diagnosis not present

## 2014-12-21 ENCOUNTER — Encounter: Payer: Self-pay | Admitting: Family Medicine

## 2014-12-22 ENCOUNTER — Other Ambulatory Visit: Payer: Self-pay | Admitting: Cardiology

## 2014-12-22 DIAGNOSIS — M1990 Unspecified osteoarthritis, unspecified site: Secondary | ICD-10-CM | POA: Diagnosis not present

## 2014-12-22 DIAGNOSIS — R0609 Other forms of dyspnea: Secondary | ICD-10-CM | POA: Diagnosis not present

## 2014-12-22 DIAGNOSIS — E669 Obesity, unspecified: Secondary | ICD-10-CM | POA: Diagnosis not present

## 2014-12-22 DIAGNOSIS — E039 Hypothyroidism, unspecified: Secondary | ICD-10-CM | POA: Diagnosis not present

## 2014-12-22 DIAGNOSIS — I10 Essential (primary) hypertension: Secondary | ICD-10-CM | POA: Diagnosis not present

## 2014-12-22 DIAGNOSIS — R079 Chest pain, unspecified: Secondary | ICD-10-CM

## 2014-12-22 DIAGNOSIS — M791 Myalgia: Secondary | ICD-10-CM | POA: Diagnosis not present

## 2014-12-22 DIAGNOSIS — I2510 Atherosclerotic heart disease of native coronary artery without angina pectoris: Secondary | ICD-10-CM | POA: Diagnosis not present

## 2014-12-22 DIAGNOSIS — E785 Hyperlipidemia, unspecified: Secondary | ICD-10-CM | POA: Diagnosis not present

## 2015-01-05 ENCOUNTER — Encounter (HOSPITAL_COMMUNITY)
Admission: RE | Admit: 2015-01-05 | Discharge: 2015-01-05 | Disposition: A | Discharge status: Home / Self Care | Payer: Medicare Other | Source: Ambulatory Visit | Attending: Cardiology | Admitting: Cardiology

## 2015-01-05 DIAGNOSIS — R079 Chest pain, unspecified: Secondary | ICD-10-CM | POA: Diagnosis present

## 2015-01-05 DIAGNOSIS — R200 Anesthesia of skin: Secondary | ICD-10-CM | POA: Diagnosis not present

## 2015-01-05 DIAGNOSIS — I2510 Atherosclerotic heart disease of native coronary artery without angina pectoris: Secondary | ICD-10-CM | POA: Diagnosis not present

## 2015-01-05 DIAGNOSIS — R0609 Other forms of dyspnea: Secondary | ICD-10-CM | POA: Diagnosis not present

## 2015-01-05 DIAGNOSIS — E785 Hyperlipidemia, unspecified: Secondary | ICD-10-CM | POA: Diagnosis not present

## 2015-01-05 DIAGNOSIS — I10 Essential (primary) hypertension: Secondary | ICD-10-CM | POA: Diagnosis not present

## 2015-01-05 LAB — NM MYOCAR MULTI W/SPECT W/WALL MOTION / EF
CHL CUP NUCLEAR SDS: 3
CHL CUP NUCLEAR SRS: 8
CHL CUP NUCLEAR SSS: 11
LV dias vol: 52 mL
LV sys vol: 21 mL
RATE: 0
TID: 1.28

## 2015-01-05 MED ORDER — TECHNETIUM TC 99M SESTAMIBI GENERIC - CARDIOLITE
10.0000 | Freq: Once | INTRAVENOUS | Status: AC | PRN
  Administered 2015-01-05: 10 via INTRAVENOUS

## 2015-01-05 MED ORDER — REGADENOSON 0.4 MG/5ML IV SOLN
INTRAVENOUS | Status: AC
  Administered 2015-01-05: 0.4 mg via INTRAVENOUS
  Filled 2015-01-05: qty 5

## 2015-01-05 MED ORDER — TECHNETIUM TC 99M SESTAMIBI GENERIC - CARDIOLITE
30.0000 | Freq: Once | INTRAVENOUS | Status: AC | PRN
  Administered 2015-01-05: 30 via INTRAVENOUS

## 2015-01-05 MED ORDER — REGADENOSON 0.4 MG/5ML IV SOLN
0.4000 mg | Freq: Once | INTRAVENOUS | Status: AC
  Administered 2015-01-05: 0.4 mg via INTRAVENOUS

## 2015-01-17 ENCOUNTER — Telehealth: Payer: Self-pay | Admitting: Family Medicine

## 2015-01-17 NOTE — Telephone Encounter (Signed)
C/o: Sleeping "all the time."  She feels like she has narcolepsy.  She says that it's very hard for her to stay awake.  She sleeps all day long.  When she tries to stay up, she feels exhausted.  Even when she's trying to eat, before she finishes her food, she falls asleep.  She says this has been going on for 2 weeks.  She says a nurse friend of hers suggested that she check her blood pressure.  When she checked her blood pressure, she noticed that it had been running low.  Blood pressures have been ranging 30-092 systolic, 33-00 diastolic.  She reports no other symptoms other than fluid in right ear.  She says her equilibrium feels off a little.  States she becomes dizzy whenever she coughs or laughs.  She says this has been going on for 2 weeks as well.    When asked about blood sugars, pt states she does not know how to check her blood sugars.  She says that despite sleeping constantly, she has been able to eat.  She reports having a cardiac stress test on 01/05/15 for an upcoming procedure.  Results were normal.   Pt has a hx of depression and reports feeling depressed all the time today.  She says she's very nervous about upcoming procedure.  She says next Monday she is scheduled to have her "nerves burned" near her ribs.  She says that she's also wearing a neck brace due to bulging discs.  Therefore, she feels like she's falling apart.  She has been taking depression/anxiety meds as prescribed.  She has no suicidal or homicidal ideations.  She is alone, but says she has a friend that constantly checks on her.    Advice:  If symptoms worsen or new symptoms develop, please go to ER.  Otherwise, please keep appointment scheduled for tomorrow with Dr. Birdie Riddle at 1:15 pm.  She agreed to comply.  She also advised to continue monitoring blood pressures, eat and drink plenty of fluids,  try to stay up as long as she can without taking naps, rise slowly when changing positions, and have someone drive her to her  appointment tomorrow.  Pt agreed to do all but have someone drive her to her appointment.  She says that she does not live far from office and feels she will be able to drive herself.  She was advised to pull over if she feels sleepy or dizzy to avoid accident.  Pt agreed, but says she will be fine.    Message routed to provider for [place].

## 2015-01-17 NOTE — Telephone Encounter (Signed)
Provider's recommendation discussed with patient.  She stated understanding and was able to repeat instructions back.  She agrees to comply and keep appointment scheduled for tomorrow at 1:15 pm with Dr. Birdie Riddle.

## 2015-01-17 NOTE — Telephone Encounter (Signed)
Decrease Metoprolol to 1/2 tab twice daily and pt needs appt

## 2015-01-17 NOTE — Telephone Encounter (Signed)
Patient Name: Tracey Evans  DOB: [birth year]    Initial Comment Caller states she has been sleeping excessively lately, bp top number has not been over 107, appt tomorrow but xfer from office if needs to be sooner   Nurse Assessment  Nurse: Raphael Gibney, RN, Vanita Ingles Date/Time Eilene Ghazi Time): 01/17/2015 11:00:54 AM  Confirm and document reason for call. If symptomatic, describe symptoms. ---Caller states her BP was 96/76 and it is now 107/74. She takes Ex forge and metoprolol. She is sleeping more than usual. She gets up and then she is exhausted. She has appt for tomorrow. She has periods of dizziness.  Has the patient traveled out of the country within the last 30 days? ---No  Does the patient have any new or worsening symptoms? ---Yes  Will a triage be completed? ---Yes  Related visit to physician within the last 2 weeks? ---No  Does the PT have any chronic conditions? (i.e. diabetes, asthma, etc.) ---Yes  List chronic conditions. ---angina; HTN     Guidelines    Guideline Title Affirmed Question Affirmed Notes  Low Blood Pressure [4] Fall in systolic BP > 20 mm Hg from normal AND [2] NOT dizzy, lightheaded, or weak (all triage questions negative)    Final Disposition User   See PCP When Office is Open (within 3 days) Raphael Gibney, RN, Vanita Ingles    Comments  Pt already has appt scheduled for tomorrow at 1:15 pm 01/18/15.   Disagree/Comply: Comply

## 2015-01-18 ENCOUNTER — Ambulatory Visit (INDEPENDENT_AMBULATORY_CARE_PROVIDER_SITE_OTHER): Payer: Medicare Other | Admitting: Family Medicine

## 2015-01-18 ENCOUNTER — Encounter: Payer: Self-pay | Admitting: Family Medicine

## 2015-01-18 VITALS — BP 104/74 | HR 81 | Temp 98.0°F | Resp 16 | Wt 209.1 lb

## 2015-01-18 DIAGNOSIS — I10 Essential (primary) hypertension: Secondary | ICD-10-CM

## 2015-01-18 MED ORDER — AMLODIPINE BESYLATE-VALSARTAN 5-160 MG PO TABS: 1.0000 | ORAL_TABLET | Freq: Every day | ORAL | Status: DC

## 2015-01-18 NOTE — Progress Notes (Signed)
   Subjective:    Patient ID: Tracey Evans, female    DOB: 04/10/1945, 69 y.o.   MRN: [id number]  HPI HTN- chronic problem.  Pt reports home BPs have been low lately (since Saturday)- running 93-112/68-78.  Pt did not take metoprolol last night, took 1/2 tab this AM and BP is now 104/74.  Also on Exforge 5/160.  Pt reports sensation of things being 'just not quite right.  Off balance'.  Pt has hx of vertigo but this is not similar.  Pt reports sxs of fatigue, mild dizziness x2 weeks.  No CP, SOB, HAs, edema.   Review of Systems For ROS see HPI     Objective:   Physical Exam  Constitutional: She is oriented to person, place, and time. She appears well-developed and well-nourished. No distress.  HENT:  Head: Normocephalic and atraumatic.  Eyes: Conjunctivae and EOM are normal. Pupils are equal, round, and reactive to light.  Neck: Normal range of motion. Neck supple. No thyromegaly present.  Cardiovascular: Normal rate, regular rhythm, normal heart sounds and intact distal pulses.   No murmur heard. Pulmonary/Chest: Effort normal and breath sounds normal. No respiratory distress.  Abdominal: Soft. She exhibits no distension. There is no tenderness.  Musculoskeletal: She exhibits no edema.  Lymphadenopathy:    She has no cervical adenopathy.  Neurological: She is alert and oriented to person, place, and time.  Skin: Skin is warm and dry.  Psychiatric: She has a normal mood and affect. Her behavior is normal.  Vitals reviewed.         Assessment & Plan:

## 2015-01-18 NOTE — Progress Notes (Signed)
Pre visit review using our clinic review tool, if applicable. No additional management support is needed unless otherwise documented below in the visit note. 

## 2015-01-18 NOTE — Assessment & Plan Note (Signed)
Chronic problem.  Pt's BP has actually been running low lately and she has been having some orthostatic sxs.  Based on this, and lack of cardiac conditions requiring use of beta blocker, I will d/c her metoprolol and have pt monitor her BP at home.  Pt expressed understanding and is in agreement w/ plan.

## 2015-01-18 NOTE — Patient Instructions (Signed)
Follow up in 3 months to recheck diabetes STOP the Metoprolol Continue the Exforge daily Drink plenty of fluids Change positions slowly Keep track of your BP over the next few weeks and let me know what the readings are Call with any questions or concerns Hang in there!!!

## 2015-01-24 DIAGNOSIS — I10 Essential (primary) hypertension: Secondary | ICD-10-CM | POA: Diagnosis not present

## 2015-01-24 DIAGNOSIS — G588 Other specified mononeuropathies: Secondary | ICD-10-CM | POA: Diagnosis not present

## 2015-01-24 DIAGNOSIS — M1990 Unspecified osteoarthritis, unspecified site: Secondary | ICD-10-CM | POA: Diagnosis not present

## 2015-01-24 DIAGNOSIS — Z888 Allergy status to other drugs, medicaments and biological substances status: Secondary | ICD-10-CM | POA: Diagnosis not present

## 2015-01-24 DIAGNOSIS — G580 Intercostal neuropathy: Secondary | ICD-10-CM | POA: Diagnosis not present

## 2015-01-24 DIAGNOSIS — Z91041 Radiographic dye allergy status: Secondary | ICD-10-CM | POA: Diagnosis not present

## 2015-01-24 DIAGNOSIS — Z885 Allergy status to narcotic agent status: Secondary | ICD-10-CM | POA: Diagnosis not present

## 2015-01-25 ENCOUNTER — Encounter: Payer: Self-pay | Admitting: Family Medicine

## 2015-02-02 ENCOUNTER — Other Ambulatory Visit: Payer: Self-pay | Admitting: Family Medicine

## 2015-02-02 NOTE — Telephone Encounter (Signed)
Medication filled to pharmacy as requested.   

## 2015-02-17 DIAGNOSIS — M50821 Other cervical disc disorders at C4-C5 level: Secondary | ICD-10-CM | POA: Diagnosis not present

## 2015-02-17 DIAGNOSIS — M542 Cervicalgia: Secondary | ICD-10-CM | POA: Diagnosis not present

## 2015-02-17 DIAGNOSIS — M47816 Spondylosis without myelopathy or radiculopathy, lumbar region: Secondary | ICD-10-CM | POA: Diagnosis not present

## 2015-02-22 DIAGNOSIS — G894 Chronic pain syndrome: Secondary | ICD-10-CM | POA: Diagnosis not present

## 2015-02-22 DIAGNOSIS — G588 Other specified mononeuropathies: Secondary | ICD-10-CM | POA: Diagnosis not present

## 2015-02-24 ENCOUNTER — Other Ambulatory Visit: Payer: Self-pay | Admitting: Family Medicine

## 2015-02-24 NOTE — Telephone Encounter (Signed)
Medication filled to pharmacy as requested.   

## 2015-03-06 ENCOUNTER — Other Ambulatory Visit: Payer: Self-pay | Admitting: Family Medicine

## 2015-03-07 NOTE — Telephone Encounter (Signed)
Medication filled to pharmacy as requested.   

## 2015-03-09 DIAGNOSIS — M50821 Other cervical disc disorders at C4-C5 level: Secondary | ICD-10-CM | POA: Diagnosis not present

## 2015-03-09 DIAGNOSIS — M545 Low back pain: Secondary | ICD-10-CM | POA: Diagnosis not present

## 2015-03-09 DIAGNOSIS — M47816 Spondylosis without myelopathy or radiculopathy, lumbar region: Secondary | ICD-10-CM | POA: Diagnosis not present

## 2015-03-19 ENCOUNTER — Emergency Department (HOSPITAL_BASED_OUTPATIENT_CLINIC_OR_DEPARTMENT_OTHER): Payer: Medicare Other

## 2015-03-19 ENCOUNTER — Encounter (HOSPITAL_BASED_OUTPATIENT_CLINIC_OR_DEPARTMENT_OTHER): Payer: Self-pay | Admitting: Emergency Medicine

## 2015-03-19 ENCOUNTER — Emergency Department (HOSPITAL_BASED_OUTPATIENT_CLINIC_OR_DEPARTMENT_OTHER)
Admission: EM | Admit: 2015-03-19 | Discharge: 2015-03-19 | Disposition: A | Discharge status: Home / Self Care | Payer: Medicare Other | Attending: Emergency Medicine | Admitting: Emergency Medicine

## 2015-03-19 DIAGNOSIS — I208 Other forms of angina pectoris: Secondary | ICD-10-CM | POA: Diagnosis not present

## 2015-03-19 DIAGNOSIS — F329 Major depressive disorder, single episode, unspecified: Secondary | ICD-10-CM | POA: Insufficient documentation

## 2015-03-19 DIAGNOSIS — M25562 Pain in left knee: Secondary | ICD-10-CM | POA: Diagnosis present

## 2015-03-19 DIAGNOSIS — K219 Gastro-esophageal reflux disease without esophagitis: Secondary | ICD-10-CM | POA: Diagnosis not present

## 2015-03-19 DIAGNOSIS — Z7982 Long term (current) use of aspirin: Secondary | ICD-10-CM | POA: Insufficient documentation

## 2015-03-19 DIAGNOSIS — Z79899 Other long term (current) drug therapy: Secondary | ICD-10-CM | POA: Insufficient documentation

## 2015-03-19 DIAGNOSIS — J45909 Unspecified asthma, uncomplicated: Secondary | ICD-10-CM | POA: Insufficient documentation

## 2015-03-19 DIAGNOSIS — E119 Type 2 diabetes mellitus without complications: Secondary | ICD-10-CM | POA: Insufficient documentation

## 2015-03-19 DIAGNOSIS — I10 Essential (primary) hypertension: Secondary | ICD-10-CM | POA: Diagnosis not present

## 2015-03-19 DIAGNOSIS — E785 Hyperlipidemia, unspecified: Secondary | ICD-10-CM | POA: Diagnosis not present

## 2015-03-19 DIAGNOSIS — M170 Bilateral primary osteoarthritis of knee: Secondary | ICD-10-CM | POA: Diagnosis not present

## 2015-03-19 DIAGNOSIS — E039 Hypothyroidism, unspecified: Secondary | ICD-10-CM | POA: Insufficient documentation

## 2015-03-19 DIAGNOSIS — M25561 Pain in right knee: Secondary | ICD-10-CM | POA: Diagnosis not present

## 2015-03-19 MED ORDER — NAPROXEN 250 MG PO TABS
500.0000 mg | ORAL_TABLET | Freq: Once | ORAL | Status: AC
  Administered 2015-03-19: 500 mg via ORAL
  Filled 2015-03-19: qty 2

## 2015-03-19 MED ORDER — NAPROXEN 500 MG PO TABS: 500.0000 mg | ORAL_TABLET | Freq: Two times a day (BID) | ORAL | Status: DC

## 2015-03-19 NOTE — Discharge Instructions (Signed)
1. Medications: alternate naprosyn (always with food) and tylenol for pain control, usual home medications 2. Treatment: rest, ice, elevate and use brace, drink plenty of fluids, gentle stretching 3. Follow Up: Please followup with orthopedics as directed or your PCP in 1 week if no improvement for discussion of your diagnoses and further evaluation after today's visit; if you do not have a primary care doctor use the resource guide provided to find one; Please return to the ER for worsening symptoms or other concerns   Heat Therapy Heat therapy can help ease sore, stiff, injured, and tight muscles and joints. Heat relaxes your muscles, which may help ease your pain.  RISKS AND COMPLICATIONS If you have any of the following conditions, do not use heat therapy unless your health care provider has approved:  Poor circulation.  Healing wounds or scarred skin in the area being treated.  Diabetes, heart disease, or high blood pressure.  Not being able to feel (numbness) the area being treated.  Unusual swelling of the area being treated.  Active infections.  Blood clots.  Cancer.  Inability to communicate pain. This may include young children and people who have problems with their brain function (dementia).  Pregnancy. Heat therapy should only be used on old, pre-existing, or long-lasting (chronic) injuries. Do not use heat therapy on new injuries unless directed by your health care provider. HOW TO USE HEAT THERAPY There are several different kinds of heat therapy, including:  Moist heat pack.  Warm water bath.  Hot water bottle.  Electric heating pad.  Heated gel pack.  Heated wrap.  Electric heating pad. Use the heat therapy method suggested by your health care provider. Follow your health care provider's instructions on when and how to use heat therapy. GENERAL HEAT THERAPY RECOMMENDATIONS  Do not sleep while using heat therapy. Only use heat therapy while you are  awake.  Your skin may turn pink while using heat therapy. Do not use heat therapy if your skin turns red.  Do not use heat therapy if you have new pain.  High heat or long exposure to heat can cause burns. Be careful when using heat therapy to avoid burning your skin.  Do not use heat therapy on areas of your skin that are already irritated, such as with a rash or sunburn. SEEK MEDICAL CARE IF:  You have blisters, redness, swelling, or numbness.  You have new pain.  Your pain is worse. MAKE SURE YOU:  Understand these instructions.  Will watch your condition.  Will get help right away if you are not doing well or get worse.   This information is not intended to replace advice given to you by your health care provider. Make sure you discuss any questions you have with your health care provider.   Document Released: 06/18/2011 Document Revised: 04/16/2014 Document Reviewed: 05/19/2013 Elsevier Interactive Patient Education [employer].

## 2015-03-19 NOTE — ED Provider Notes (Signed)
CSN: [id number]     Arrival date & time 03/19/15  1308 History   First MD Initiated Contact with Patient 03/19/15 1354     Chief Complaint  Patient presents with  . Knee Pain     (Consider location/radiation/quality/duration/timing/severity/associated sxs/prior Treatment) Patient is a 69 y.o. female presenting with knee pain. The history is provided by the patient and medical records. No language interpreter was used.  Knee Pain Associated symptoms: no back pain, no fever and no neck pain      Tracey Evans is a 69 y.o. female  with a hx of GERD, fibromyalgia, osteoarthritis, low back pain presents to the Emergency Department complaining of gradual, persistent, progressively worsening bilateral knee pain onset 1 week ago.  Pt reports taking tizanidine without relief.  She reports she often takes Aleve but has not taken any recently.  She reports that at times the pain is bad enough that is makes her knee feel like it will buckle but she has not fallen.  No weakness, numbness, tingling, swelling, ecchymosis.  No known trauma or injury.  Pt reports she has been having a fibromyalgia flare for the last 2 weeks.  She is followed by [facility] ortho.  Pt reports she is able to walk, but this does make the pain worse.    Past Medical History  Diagnosis Date  . GERD (gastroesophageal reflux disease)   . Hyperlipidemia   . Hypertension   . Depression   . Fibromyalgia   . Peptic ulcer disease   . Hypothyroidism   . Low back pain   . Osteoarthritis   . Asthma   . Diverticular disease   . Complication of anesthesia     had bronc spasms during intubation surgery 2009 on foot-need albuterol inhaler or neb tx preop  . Angina at rest [facility])     "occurs whenever it wants to, but worse during agitation"  . Binge eating disorder   . Costochondritis   . Diabetes mellitus without complication [facility])    Past Surgical History  Procedure Laterality Date  . Cholecystectomy    . Abdominal  hysterectomy    . Tonsillectomy    . Shoulder surgery  2009,2011    rt and lt  . Colonoscopy    . Ankle fracture surgery  2009    rt   . Ankle arthroscopy  01/02/2012    Procedure: ANKLE ARTHROSCOPY;  Surgeon: Colin Rhein, MD;  Location: [place];  Service: Orthopedics;  Laterality: Right;  right ankle arthroscopy with extensive debridement , dridement and drilling talar dome osteochondral lesion   Family History  Problem Relation Age of Onset  . Dementia Mother   . Dementia Father   . Alcohol abuse Father   . Cancer Sister     breast cancer  . Alcohol abuse Brother    Social History  Substance Use Topics  . Smoking status: Never Smoker   . Smokeless tobacco: None  . Alcohol Use: No   OB History    No data available     Review of Systems  Constitutional: Negative for fever and chills.  Gastrointestinal: Negative for nausea and vomiting.  Musculoskeletal: Positive for arthralgias ( bilateral knee). Negative for back pain, joint swelling, neck pain and neck stiffness.  Skin: Negative for wound.  Neurological: Negative for numbness.  Hematological: Does not bruise/bleed easily.  Psychiatric/Behavioral: The patient is not nervous/anxious.   All other systems reviewed and are negative.     Allergies  Contrast media; Dilaudid; Adhesive; Codeine; Dilaudid; and Lactose intolerance (gi)  Home Medications   Prior to Admission medications   Medication Sig Start Date End Date Taking? Authorizing Provider  albuterol (PROAIR HFA) 108 (90 BASE) MCG/ACT inhaler Inhale 2 puffs into the lungs every 6 (six) hours as needed for wheezing. 08/30/14   Midge Minium, MD  ALPRAZolam Duanne Moron) 1 MG tablet Take 1 tablet (1 mg total) by mouth 2 (two) times daily as needed for anxiety. 11/18/14   Midge Minium, MD  amLODipine-valsartan (EXFORGE) 5-160 MG tablet Take 1 tablet by mouth daily. 01/18/15   Midge Minium, MD  aspirin 81 MG tablet Take 81 mg by mouth  daily.    Historical Provider, MD  atorvastatin (LIPITOR) 20 MG tablet TAKE 1 TABLET BY MOUTH EVERY DAY 03/07/15   Midge Minium, MD  buPROPion (WELLBUTRIN XL) 300 MG 24 hr tablet TAKE 1 TABLET BY MOUTH EVERY DAY 02/02/15   Midge Minium, MD  clonazePAM (KLONOPIN) 0.5 MG tablet TAKE 1 TABLET BY MOUTH AT BEDTIME AS NEEDED    Midge Minium, MD  cyclobenzaprine (FLEXERIL) 5 MG tablet Take 5 mg by mouth 2 (two) times daily as needed. For pain    Historical Provider, MD  levothyroxine (SYNTHROID, LEVOTHROID) 50 MCG tablet TAKE 1 TABLET BY MOUTH ONCE DAILY 11/30/14   Midge Minium, MD  metoprolol tartrate (LOPRESSOR) 25 MG tablet TAKE 1 TABLET BY MOUTH TWICE A DAY 02/24/15   Midge Minium, MD  naproxen (NAPROSYN) 500 MG tablet Take 1 tablet (500 mg total) by mouth 2 (two) times daily with a meal. 03/19/15   Cordai Rodrigue, PA-C  nitroGLYCERIN (NITROSTAT) 0.4 MG SL tablet Place 1 tablet (0.4 mg total) under the tongue every 5 (five) minutes as needed. As needed for chest pain 11/18/14   Midge Minium, MD  ondansetron (ZOFRAN ODT) 8 MG disintegrating tablet 8mg  ODT q4 hours prn nausea 07/18/14   Veryl Speak, MD  promethazine (PHENERGAN) 12.5 MG tablet Take 12.5 mg by mouth every 6 (six) hours as needed. for nausea 08/03/14   Historical Provider, MD  ranitidine (ZANTAC) 150 MG tablet Take 150 mg by mouth 2 (two) times daily.    Historical Provider, MD  tiZANidine (ZANAFLEX) 4 MG tablet Take 4 mg by mouth every 6 (six) hours as needed for muscle spasms.    Historical Provider, MD   BP 134/100 mmHg  Pulse 98  Temp(Src) 97.6 F (36.4 C) (Oral)  Resp 16  Ht 5\' 5"  (1.651 m)  Wt 92.987 kg  BMI 34.11 kg/m2  SpO2 97% Physical Exam  Constitutional: She appears well-developed and well-nourished. No distress.  HENT:  Head: Normocephalic and atraumatic.  Eyes: Conjunctivae are normal.  Neck: Normal range of motion.  Cardiovascular: Normal rate, regular rhythm and intact distal  pulses.   Capillary refill < 3 sec  Pulmonary/Chest: Effort normal and breath sounds normal.  Musculoskeletal: She exhibits tenderness. She exhibits no edema.  ROM: FROM of the bilateral hip, knee, ankles and toes No deformity No joint effusion No ecchymosis or palpable patellar defect No abnormal patellar movement  Neurological: She is alert. Coordination normal.  Sensation intact to dull and sharp in the bilateral lower extremities Strength 5/5 in the bilateral lower extremities Patient ambulates with antalgic gait  Skin: Skin is warm and dry. She is not diaphoretic.  No tenting of the skin  Psychiatric: She has a normal mood and affect.  Nursing note and vitals reviewed.  ED Course  Procedures (including critical care time)  Imaging Review Dg Knee Complete 4 Views Left  03/19/2015  CLINICAL DATA:  Bilateral knee pain, worse on the left EXAM: LEFT KNEE - COMPLETE 4+ VIEW COMPARISON:  03/19/2015 FINDINGS: Minor degenerative changes of the left knee. No malalignment, acute displaced fracture or large effusion. Bones are osteopenic. No definite soft tissue abnormality. IMPRESSION: Mild degenerative changes without acute osseous finding or effusion. Electronically Signed   By: Jerilynn Mages.  Shick M.D.   On: 03/19/2015 14:30   Dg Knee Complete 4 Views Right  03/19/2015  CLINICAL DATA:  69 year old presenting with 1 week history of bilateral knee pain, left greater than right. Pain is predominantly lateral in location. No known injuries. EXAM: RIGHT KNEE - COMPLETE 4+ VIEW COMPARISON:  None. FINDINGS: No evidence of acute or subacute fracture or dislocation. Severe lateral compartment joint space narrowing and moderate medial compartment joint space narrowing with associated spurring. Chondrocalcinosis involving the lateral meniscus. Patellofemoral compartment joint space relatively well-preserved. Bone mineral density well-preserved for age. No visible joint effusion. Enthesopathy at muscular  insertions on the distal femur and the proximal tibia posteriorly. IMPRESSION: 1. No acute or subacute osseous abnormality. 2. Severe lateral compartment osteoarthritis and moderate medial compartment osteoarthritis, secondary to CPPD. Electronically Signed   By: Evangeline Dakin M.D.   On: 03/19/2015 14:30   I have personally reviewed and evaluated these images and lab results as part of my medical decision-making.    MDM   Final diagnoses:  Osteoarthritis of both knees, unspecified osteoarthritis type   Phyllis Ginger presents with nontraumatic bilateral knee pain.  Patient X-Rays negative for obvious fracture or dislocation. Degenerative changes noted, right worse than left. Pain managed in ED. Pt advised to follow up with orthopedics for further evaluation and treatment. Patient given brace while in ED, conservative therapy recommended and discussed. Patient will be dc home & is agreeable with above plan.  BP 134/100 mmHg  Pulse 98  Temp(Src) 97.6 F (36.4 C) (Oral)  Resp 16  Ht 5\' 5"  (1.651 m)  Wt 92.987 kg  BMI 34.11 kg/m2  SpO2 97%     Abigail Butts, PA-C 03/19/15 [place], MD 03/20/15 [phone]

## 2015-03-19 NOTE — ED Notes (Signed)
Pt c/o bilateral knee pain for past week with no known injury.  Pt states a stabbing pain when she bends her knees and then they seem to "just buckle." Pt has not tried any OTC medications for pain relief.

## 2015-03-21 ENCOUNTER — Other Ambulatory Visit: Payer: Self-pay | Admitting: Family Medicine

## 2015-03-21 NOTE — Telephone Encounter (Signed)
Medication filled to pharmacy as requested.   

## 2015-03-23 DIAGNOSIS — M170 Bilateral primary osteoarthritis of knee: Secondary | ICD-10-CM | POA: Diagnosis not present

## 2015-03-23 DIAGNOSIS — M25562 Pain in left knee: Secondary | ICD-10-CM | POA: Diagnosis not present

## 2015-03-23 DIAGNOSIS — M25561 Pain in right knee: Secondary | ICD-10-CM | POA: Diagnosis not present

## 2015-03-25 DIAGNOSIS — E785 Hyperlipidemia, unspecified: Secondary | ICD-10-CM | POA: Diagnosis not present

## 2015-03-25 DIAGNOSIS — M1990 Unspecified osteoarthritis, unspecified site: Secondary | ICD-10-CM | POA: Diagnosis not present

## 2015-03-25 DIAGNOSIS — M797 Fibromyalgia: Secondary | ICD-10-CM | POA: Diagnosis not present

## 2015-03-25 DIAGNOSIS — E039 Hypothyroidism, unspecified: Secondary | ICD-10-CM | POA: Diagnosis not present

## 2015-03-25 DIAGNOSIS — I10 Essential (primary) hypertension: Secondary | ICD-10-CM | POA: Diagnosis not present

## 2015-03-25 DIAGNOSIS — I2510 Atherosclerotic heart disease of native coronary artery without angina pectoris: Secondary | ICD-10-CM | POA: Diagnosis not present

## 2015-03-25 DIAGNOSIS — E669 Obesity, unspecified: Secondary | ICD-10-CM | POA: Diagnosis not present

## 2015-04-10 DIAGNOSIS — Z9221 Personal history of antineoplastic chemotherapy: Secondary | ICD-10-CM

## 2015-04-10 HISTORY — DX: Personal history of antineoplastic chemotherapy: Z92.21

## 2015-04-21 ENCOUNTER — Encounter: Payer: Self-pay | Admitting: Family Medicine

## 2015-04-21 ENCOUNTER — Ambulatory Visit (INDEPENDENT_AMBULATORY_CARE_PROVIDER_SITE_OTHER): Payer: Medicare Other | Admitting: Family Medicine

## 2015-04-21 VITALS — BP 105/73 | HR 75 | Temp 97.6°F | Ht 65.0 in | Wt 211.0 lb

## 2015-04-21 DIAGNOSIS — Z23 Encounter for immunization: Secondary | ICD-10-CM

## 2015-04-21 DIAGNOSIS — E119 Type 2 diabetes mellitus without complications: Secondary | ICD-10-CM

## 2015-04-21 DIAGNOSIS — M797 Fibromyalgia: Secondary | ICD-10-CM

## 2015-04-21 LAB — BASIC METABOLIC PANEL
BUN: 13 mg/dL (ref 6–23)
CHLORIDE: 108 meq/L (ref 96–112)
CO2: 28 meq/L (ref 19–32)
CREATININE: 0.93 mg/dL (ref 0.40–1.20)
Calcium: 9.2 mg/dL (ref 8.4–10.5)
GFR: 76.82 mL/min (ref >60.00)
Glucose, Bld: 113 mg/dL — ABNORMAL HIGH (ref 70–99)
Potassium: 3.5 mEq/L (ref 3.5–5.1)
SODIUM: 142 meq/L (ref 135–145)

## 2015-04-21 LAB — HEMOGLOBIN A1C: HEMOGLOBIN A1C: 6.3 % (ref 4.6–6.5)

## 2015-04-21 NOTE — Progress Notes (Signed)
   Subjective:    Patient ID: Tracey Evans, female    DOB: October 13, 1945, 70 y.o.   MRN: [id number]  HPI DM- chronic problem.  Pt is currently diet controlled.  On ARB for renal protection.  UTD on foot exam.  Due for eye exam.  Pt has gained 6 lbs over the holidays eating [employer] at [facility].  Unable to exercise due to chronic pain.  Not checking sugars.  Denies symptomatic lows.  Denies CP, SOB, HAs, visual changes, edema.  No numbness/tingling of hands/feet.  Fibro- pt did not tolerate Lyrica in the past.  Difficulty w/ Cymbalta.  Continues to have chronic pain.  Taking Mobic daily w/ some relief.   Review of Systems For ROS see HPI     Objective:   Physical Exam  Constitutional: She is oriented to person, place, and time. She appears well-developed and well-nourished. No distress.  HENT:  Head: Normocephalic and atraumatic.  Eyes: Conjunctivae and EOM are normal. Pupils are equal, round, and reactive to light.  Neck: Normal range of motion. Neck supple. No thyromegaly present.  Cardiovascular: Normal rate, regular rhythm, normal heart sounds and intact distal pulses.   No murmur heard. Pulmonary/Chest: Effort normal and breath sounds normal. No respiratory distress.  Abdominal: Soft. She exhibits no distension. There is no tenderness.  Musculoskeletal: She exhibits no edema.  Lymphadenopathy:    She has no cervical adenopathy.  Neurological: She is alert and oriented to person, place, and time.  Skin: Skin is warm and dry.  Psychiatric: She has a normal mood and affect. Her behavior is normal.  Vitals reviewed.         Assessment & Plan:

## 2015-04-21 NOTE — Assessment & Plan Note (Signed)
Chronic problem.  Pt continues to have pain.  Unable to take Lyrica or Cymbalta due to past side effects.  Ortho recently started her on daily Mobic- this is providing some relief.  Will continue to follow.

## 2015-04-21 NOTE — Assessment & Plan Note (Signed)
Chronic problem.  Currently diet controlled.  Pt has again gained weight.  Stressed the importance of low carb diet.  Pt instructed to schedule eye exam.  On ARB for renal protection.  Check labs.  Start meds prn.

## 2015-04-21 NOTE — Patient Instructions (Signed)
Schedule your complete physical in 3-4 months We'll notify you of your lab results and make any changes if needed Call your eye doctor and schedule your appt- if you have trouble getting an appointment, let me know and we can refer Try and make healthy food choices and get exercise as you are able Call with any questions or concerns Happy New Year!!!

## 2015-04-21 NOTE — Progress Notes (Signed)
Pre visit review using our clinic review tool, if applicable. No additional management support is needed unless otherwise documented below in the visit note. 

## 2015-04-26 DIAGNOSIS — M47816 Spondylosis without myelopathy or radiculopathy, lumbar region: Secondary | ICD-10-CM | POA: Diagnosis not present

## 2015-05-10 DIAGNOSIS — M546 Pain in thoracic spine: Secondary | ICD-10-CM | POA: Diagnosis not present

## 2015-05-10 DIAGNOSIS — M50821 Other cervical disc disorders at C4-C5 level: Secondary | ICD-10-CM | POA: Diagnosis not present

## 2015-05-10 DIAGNOSIS — M797 Fibromyalgia: Secondary | ICD-10-CM | POA: Diagnosis not present

## 2015-05-10 DIAGNOSIS — M47816 Spondylosis without myelopathy or radiculopathy, lumbar region: Secondary | ICD-10-CM | POA: Diagnosis not present

## 2015-05-22 ENCOUNTER — Other Ambulatory Visit: Payer: Self-pay | Admitting: Family Medicine

## 2015-05-23 NOTE — Telephone Encounter (Signed)
Last OV 04/21/15 Alprazolam last filled 11/18/14 #60 with 3

## 2015-05-23 NOTE — Telephone Encounter (Signed)
Medication filled to pharmacy as requested.   

## 2015-05-26 DIAGNOSIS — H25813 Combined forms of age-related cataract, bilateral: Secondary | ICD-10-CM | POA: Diagnosis not present

## 2015-05-26 DIAGNOSIS — H04123 Dry eye syndrome of bilateral lacrimal glands: Secondary | ICD-10-CM | POA: Diagnosis not present

## 2015-05-26 DIAGNOSIS — H43813 Vitreous degeneration, bilateral: Secondary | ICD-10-CM | POA: Diagnosis not present

## 2015-06-02 ENCOUNTER — Other Ambulatory Visit: Payer: Self-pay

## 2015-06-02 MED ORDER — AMLODIPINE BESYLATE-VALSARTAN 5-160 MG PO TABS: 1.0000 | ORAL_TABLET | Freq: Every day | ORAL | Status: DC

## 2015-06-03 DIAGNOSIS — M5136 Other intervertebral disc degeneration, lumbar region: Secondary | ICD-10-CM | POA: Diagnosis not present

## 2015-06-03 DIAGNOSIS — M797 Fibromyalgia: Secondary | ICD-10-CM | POA: Diagnosis not present

## 2015-06-03 DIAGNOSIS — M150 Primary generalized (osteo)arthritis: Secondary | ICD-10-CM | POA: Diagnosis not present

## 2015-06-06 ENCOUNTER — Other Ambulatory Visit: Payer: Self-pay | Admitting: Family Medicine

## 2015-06-06 NOTE — Telephone Encounter (Signed)
Medication filled to pharmacy as requested.   

## 2015-06-07 DIAGNOSIS — M50821 Other cervical disc disorders at C4-C5 level: Secondary | ICD-10-CM | POA: Diagnosis not present

## 2015-06-07 DIAGNOSIS — M797 Fibromyalgia: Secondary | ICD-10-CM | POA: Diagnosis not present

## 2015-06-07 DIAGNOSIS — M47816 Spondylosis without myelopathy or radiculopathy, lumbar region: Secondary | ICD-10-CM | POA: Diagnosis not present

## 2015-06-08 ENCOUNTER — Other Ambulatory Visit: Payer: Self-pay

## 2015-06-08 DIAGNOSIS — Z1231 Encounter for screening mammogram for malignant neoplasm of breast: Secondary | ICD-10-CM

## 2015-06-20 ENCOUNTER — Ambulatory Visit
Admission: RE | Admit: 2015-06-20 | Discharge: 2015-06-20 | Disposition: A | Discharge status: Home / Self Care | Payer: Medicare Other | Source: Ambulatory Visit

## 2015-06-20 DIAGNOSIS — Z1231 Encounter for screening mammogram for malignant neoplasm of breast: Secondary | ICD-10-CM | POA: Diagnosis not present

## 2015-06-22 ENCOUNTER — Other Ambulatory Visit: Payer: Self-pay | Admitting: Family Medicine

## 2015-06-22 DIAGNOSIS — R928 Other abnormal and inconclusive findings on diagnostic imaging of breast: Secondary | ICD-10-CM

## 2015-06-24 DIAGNOSIS — M1990 Unspecified osteoarthritis, unspecified site: Secondary | ICD-10-CM | POA: Diagnosis not present

## 2015-06-24 DIAGNOSIS — R0789 Other chest pain: Secondary | ICD-10-CM | POA: Diagnosis not present

## 2015-06-24 DIAGNOSIS — M797 Fibromyalgia: Secondary | ICD-10-CM | POA: Diagnosis not present

## 2015-06-24 DIAGNOSIS — E785 Hyperlipidemia, unspecified: Secondary | ICD-10-CM | POA: Diagnosis not present

## 2015-06-24 DIAGNOSIS — E039 Hypothyroidism, unspecified: Secondary | ICD-10-CM | POA: Diagnosis not present

## 2015-06-24 DIAGNOSIS — I2510 Atherosclerotic heart disease of native coronary artery without angina pectoris: Secondary | ICD-10-CM | POA: Diagnosis not present

## 2015-06-24 DIAGNOSIS — I10 Essential (primary) hypertension: Secondary | ICD-10-CM | POA: Diagnosis not present

## 2015-06-24 DIAGNOSIS — E669 Obesity, unspecified: Secondary | ICD-10-CM | POA: Diagnosis not present

## 2015-06-24 LAB — HM DIABETES EYE EXAM

## 2015-06-27 ENCOUNTER — Encounter: Payer: Self-pay | Admitting: Internal Medicine

## 2015-06-27 ENCOUNTER — Telehealth: Payer: Self-pay

## 2015-06-27 NOTE — Telephone Encounter (Signed)
[facility] note received via fax  Call  Date:06/26/15 Time: 10:37:03 am   Caller:  Patient Return number:  [phone]  Nurse: Charmayne Sheer, RN  Chief Complaint:  Anxiety and Panic Attack  Reason for call:  Caller states is needing to know if she can increase her Xanax dosage.  Caller stated that she has been having some anxiety and upset and has been having the issue of calling the person who she is seeing by someone else's name and has been confused as to why she is doing this anxious at present.    Related visit to physician within the last 2 weeks:  No  Guideline:  Anxiety and Panic attack---Pt sounds very upset or troubled to the triager  Disposition:  See physician within 24 hours.      Called to follow up with patient.  Left a message for call back.

## 2015-06-28 ENCOUNTER — Ambulatory Visit
Admission: RE | Admit: 2015-06-28 | Discharge: 2015-06-28 | Disposition: A | Discharge status: Home / Self Care | Payer: Medicare Other | Source: Ambulatory Visit | Attending: Family Medicine | Admitting: Family Medicine

## 2015-06-28 ENCOUNTER — Other Ambulatory Visit: Payer: Self-pay | Admitting: Family Medicine

## 2015-06-28 ENCOUNTER — Other Ambulatory Visit: Payer: Medicare Other

## 2015-06-28 DIAGNOSIS — R921 Mammographic calcification found on diagnostic imaging of breast: Secondary | ICD-10-CM

## 2015-06-28 DIAGNOSIS — R928 Other abnormal and inconclusive findings on diagnostic imaging of breast: Secondary | ICD-10-CM

## 2015-06-28 DIAGNOSIS — N63 Unspecified lump in breast: Secondary | ICD-10-CM | POA: Diagnosis not present

## 2015-06-28 NOTE — Telephone Encounter (Signed)
Pt returned call. She says that she was helped and now she is perfectly fine. She would like to thank you for reaching out to her. Pt says no call back is needed.

## 2015-07-04 ENCOUNTER — Encounter: Payer: Self-pay | Admitting: Family Medicine

## 2015-07-06 ENCOUNTER — Other Ambulatory Visit: Payer: Self-pay | Admitting: Family Medicine

## 2015-07-06 ENCOUNTER — Ambulatory Visit
Admission: RE | Admit: 2015-07-06 | Discharge: 2015-07-06 | Disposition: A | Discharge status: Home / Self Care | Payer: Medicare Other | Source: Ambulatory Visit | Attending: Family Medicine | Admitting: Family Medicine

## 2015-07-06 DIAGNOSIS — C50911 Malignant neoplasm of unspecified site of right female breast: Secondary | ICD-10-CM | POA: Diagnosis not present

## 2015-07-06 DIAGNOSIS — D0511 Intraductal carcinoma in situ of right breast: Secondary | ICD-10-CM | POA: Diagnosis not present

## 2015-07-06 DIAGNOSIS — R928 Other abnormal and inconclusive findings on diagnostic imaging of breast: Secondary | ICD-10-CM

## 2015-07-06 DIAGNOSIS — N63 Unspecified lump in breast: Secondary | ICD-10-CM | POA: Diagnosis not present

## 2015-07-06 DIAGNOSIS — R921 Mammographic calcification found on diagnostic imaging of breast: Secondary | ICD-10-CM

## 2015-07-08 ENCOUNTER — Encounter: Payer: Self-pay | Admitting: *Deleted

## 2015-07-08 ENCOUNTER — Telehealth: Payer: Self-pay | Admitting: *Deleted

## 2015-07-08 DIAGNOSIS — C50511 Malignant neoplasm of lower-outer quadrant of right female breast: Secondary | ICD-10-CM | POA: Insufficient documentation

## 2015-07-08 DIAGNOSIS — C50512 Malignant neoplasm of lower-outer quadrant of left female breast: Secondary | ICD-10-CM

## 2015-07-08 HISTORY — DX: Malignant neoplasm of lower-outer quadrant of left female breast: C50.512

## 2015-07-08 NOTE — Telephone Encounter (Signed)
Confirmed [facility] for 07/13/15 at 0815 .  Instructions and contact information given. 

## 2015-07-09 HISTORY — PX: BREAST BIOPSY: SHX20

## 2015-07-13 ENCOUNTER — Ambulatory Visit: Payer: Medicare Other | Attending: General Surgery | Admitting: Physical Therapy

## 2015-07-13 ENCOUNTER — Ambulatory Visit (HOSPITAL_BASED_OUTPATIENT_CLINIC_OR_DEPARTMENT_OTHER): Payer: Medicare Other | Admitting: Hematology and Oncology

## 2015-07-13 ENCOUNTER — Encounter: Payer: Self-pay | Admitting: Skilled Nursing Facility1

## 2015-07-13 ENCOUNTER — Ambulatory Visit
Admission: RE | Admit: 2015-07-13 | Discharge: 2015-07-13 | Disposition: A | Discharge status: Home / Self Care | Payer: Medicare Other | Source: Ambulatory Visit | Attending: Radiation Oncology | Admitting: Radiation Oncology

## 2015-07-13 ENCOUNTER — Other Ambulatory Visit: Payer: Self-pay

## 2015-07-13 ENCOUNTER — Encounter: Payer: Self-pay | Admitting: Hematology and Oncology

## 2015-07-13 ENCOUNTER — Encounter: Payer: Self-pay | Admitting: *Deleted

## 2015-07-13 ENCOUNTER — Other Ambulatory Visit (HOSPITAL_BASED_OUTPATIENT_CLINIC_OR_DEPARTMENT_OTHER): Payer: Medicare Other

## 2015-07-13 ENCOUNTER — Encounter: Payer: Self-pay | Admitting: Physical Therapy

## 2015-07-13 VITALS — BP 126/88 | HR 81 | Temp 98.0°F | Resp 20 | Ht 65.0 in | Wt 207.9 lb

## 2015-07-13 DIAGNOSIS — C50311 Malignant neoplasm of lower-inner quadrant of right female breast: Secondary | ICD-10-CM | POA: Diagnosis not present

## 2015-07-13 DIAGNOSIS — Z803 Family history of malignant neoplasm of breast: Secondary | ICD-10-CM | POA: Diagnosis not present

## 2015-07-13 DIAGNOSIS — R293 Abnormal posture: Secondary | ICD-10-CM | POA: Insufficient documentation

## 2015-07-13 DIAGNOSIS — E785 Hyperlipidemia, unspecified: Secondary | ICD-10-CM | POA: Diagnosis not present

## 2015-07-13 DIAGNOSIS — I10 Essential (primary) hypertension: Secondary | ICD-10-CM | POA: Diagnosis not present

## 2015-07-13 DIAGNOSIS — C50511 Malignant neoplasm of lower-outer quadrant of right female breast: Secondary | ICD-10-CM

## 2015-07-13 DIAGNOSIS — C50512 Malignant neoplasm of lower-outer quadrant of left female breast: Secondary | ICD-10-CM

## 2015-07-13 DIAGNOSIS — Z170 Estrogen receptor positive status [ER+]: Secondary | ICD-10-CM

## 2015-07-13 DIAGNOSIS — M797 Fibromyalgia: Secondary | ICD-10-CM | POA: Diagnosis not present

## 2015-07-13 DIAGNOSIS — C50111 Malignant neoplasm of central portion of right female breast: Secondary | ICD-10-CM | POA: Diagnosis not present

## 2015-07-13 DIAGNOSIS — Z90710 Acquired absence of both cervix and uterus: Secondary | ICD-10-CM | POA: Diagnosis not present

## 2015-07-13 DIAGNOSIS — K219 Gastro-esophageal reflux disease without esophagitis: Secondary | ICD-10-CM | POA: Diagnosis not present

## 2015-07-13 LAB — CBC WITH DIFFERENTIAL/PLATELET
BASO%: 0.6 % (ref 0.0–2.0)
BASOS ABS: 0 10*3/uL (ref 0.0–0.1)
EOS ABS: 0.5 10*3/uL (ref 0.0–0.5)
EOS%: 8.3 % — ABNORMAL HIGH (ref 0.0–7.0)
HCT: 40.6 % (ref 34.8–46.6)
HEMOGLOBIN: 13.3 g/dL (ref 11.6–15.9)
LYMPH%: 33.3 % (ref 14.0–49.7)
MCH: 30.9 pg (ref 25.1–34.0)
MCHC: 32.8 g/dL (ref 31.5–36.0)
MCV: 94.2 fL (ref 79.5–101.0)
MONO#: 0.5 10*3/uL (ref 0.1–0.9)
MONO%: 7 % (ref 0.0–14.0)
NEUT%: 50.8 % (ref 38.4–76.8)
NEUTROS ABS: 3.3 10*3/uL (ref 1.5–6.5)
PLATELETS: 299 10*3/uL (ref 145–400)
RBC: 4.31 10*6/uL (ref 3.70–5.45)
RDW: 13.5 % (ref 11.2–14.5)
WBC: 6.4 10*3/uL (ref 3.9–10.3)
lymph#: 2.1 10*3/uL (ref 0.9–3.3)

## 2015-07-13 LAB — COMPREHENSIVE METABOLIC PANEL
ALBUMIN: 3.8 g/dL (ref 3.5–5.0)
ALK PHOS: 89 U/L (ref 40–150)
ALT: 23 U/L (ref 0–55)
ANION GAP: 6 meq/L (ref 3–11)
AST: 23 U/L (ref 5–34)
BILIRUBIN TOTAL: 0.94 mg/dL (ref 0.20–1.20)
BUN: 10.3 mg/dL (ref 7.0–26.0)
CALCIUM: 9.4 mg/dL (ref 8.4–10.4)
CO2: 30 mEq/L — ABNORMAL HIGH (ref 22–29)
Chloride: 107 mEq/L (ref 98–109)
Creatinine: 1 mg/dL (ref 0.6–1.1)
EGFR: 69 mL/min/{1.73_m2} — AB (ref >90)
Glucose: 148 mg/dl — ABNORMAL HIGH (ref 70–140)
POTASSIUM: 3.7 meq/L (ref 3.5–5.1)
Sodium: 144 mEq/L (ref 136–145)
TOTAL PROTEIN: 6.7 g/dL (ref 6.4–8.3)

## 2015-07-13 MED ORDER — ANASTROZOLE 1 MG PO TABS: 1.0000 mg | ORAL_TABLET | Freq: Every day | ORAL | Status: DC

## 2015-07-13 NOTE — Progress Notes (Signed)
[place]  [facility] Psychosocial Distress Screening Clinical Social Work  Clinical Social Work met with pt, and her son and daughter at [facility] to introduce self, explain role of CSW/ Support Team and to review the distress screening protocol.  The patient scored a 6 on the Psychosocial Distress Thermometer which indicates moderate distress. Clinical Education officer, museum and pt reviewed distress and other psychosocial needs. Pt reports she had issues with anxiety and depression related to her fibromyalgia. She feels these concerns are well managed through medication, but CSW and pt discussed today additional resources to assist with coping and adjustment to illness. CSW reviewed common emotions/reactions to recent breast cancer diagnosis and coping techniques/resources to assist. Support Program calendar provided today. Pt interested in completing ADRs with CSW at later date. CSW educated pt on process and answered questions.  Pt to contact CSW when she is ready to complete ADRs.     ONCBCN DISTRESS SCREENING 07/13/2015  Screening Type Initial Screening  Distress experienced in past week (1-10) 6  Emotional problem type Depression;Nervousness/Anxiety;Adjusting to illness  Information Concerns Type Lack of info about diagnosis;Lack of info about treatment;Lack of info about complementary therapy choices;Lack of info about maintaining fitness  Physical Problem type Pain;Nausea/vomiting;Sleep/insomnia;Swollen arms/legs  Physician notified of physical symptoms Yes  Referral to clinical social work Yes  Referral to support programs Yes     Clinical Social Worker follow up needed: Yes.    If yes, follow up plan: See above Loren Racer, [place] Worker [place]  [facility] Phone: [phone] Fax: [phone]

## 2015-07-13 NOTE — Patient Instructions (Signed)

## 2015-07-13 NOTE — Therapy (Signed)
[place], [place], [address] Phone: [phone]   Fax:  [phone]  Physical Therapy Evaluation  Patient Details  Name: Tracey Evans MRN: [id number] Date of Birth: [birth year] Referring Provider: Dr. Fanny Skates  Encounter Date: 07/13/2015      PT End of Session - 07/13/15 1317    Visit Number 1   Number of Visits 1   PT Start Time [id number]   PT Stop Time 1152   PT Time Calculation (min) 24 min   Activity Tolerance Patient tolerated treatment well   Behavior During Therapy [facility] for tasks assessed/performed      Past Medical History  Diagnosis Date  . GERD (gastroesophageal reflux disease)   . Hyperlipidemia   . Hypertension   . Depression   . Fibromyalgia   . Peptic ulcer disease   . Hypothyroidism   . Low back pain   . Osteoarthritis   . Asthma   . Diverticular disease   . Complication of anesthesia     had bronc spasms during intubation surgery 2009 on foot-need albuterol inhaler or neb tx preop  . Angina at rest [facility])     "occurs whenever it wants to, but worse during agitation"  . Binge eating disorder   . Costochondritis   . Diabetes mellitus without complication ([place])   . Breast cancer of lower-outer quadrant of left female breast ([place]) 07/08/2015  . Anxiety   . Hot flashes     Past Surgical History  Procedure Laterality Date  . Cholecystectomy    . Abdominal hysterectomy    . Tonsillectomy    . Shoulder surgery  2009,2011    rt and lt  . Colonoscopy    . Ankle fracture surgery  2009    rt   . Ankle arthroscopy  01/02/2012    Procedure: ANKLE ARTHROSCOPY;  Surgeon: Colin Rhein, MD;  Location: [facility];  Service: Orthopedics;  Laterality: Right;  right ankle arthroscopy with extensive debridement , dridement and drilling talar dome osteochondral lesion    There were no vitals filed for this visit.  Visit Diagnosis:  Carcinoma of lower-inner  quadrant of right breast ([place]) - Plan: PT plan of care cert/re-cert  Abnormal posture - Plan: PT plan of care cert/re-cert      Subjective Assessment - 07/13/15 1318    Subjective Patient reports she is here today to be seen by her medical team for her newly diagnosed right breast cancer.   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  The clips are 6 cm apart.   Patient Stated Goals Learn post op shoulder ROM HEP and reduce lympehdema risk.            [facility] PT Assessment - 07/13/15 0001    Assessment   Medical Diagnosis Right breast cancer   Referring Provider Dr. Fanny Skates   Onset Date/Surgical Date 06/20/15   Hand Dominance Right   Prior Therapy none   Precautions   Precautions Other (comment)   Precaution Comments active cancer   Restrictions   Weight Bearing Restrictions No   Balance Screen   Has the patient fallen in the past 6 months No   Has the patient had a  decrease in activity level because of a fear of falling?  No   Is the patient reluctant to leave their home because of a fear of falling?  No   Home Environment   Living Environment Private residence   Living Arrangements Alone   Available Help at Discharge Family  Son and daughter are here today but live in [place]   Prior Function   Level of [place] Retired   Leisure She does not exercise   Cognition   Overall Cognitive Status Within Functional Limits for tasks assessed   Posture/Postural Control   Posture/Postural Control Postural limitations   Postural Limitations Rounded Shoulders;Forward head;Increased thoracic kyphosis   ROM / Strength   AROM / PROM / Strength AROM;Strength   AROM   AROM Assessment Site Shoulder;Cervical    Right/Left Shoulder Right;Left   Right Shoulder Extension 52 Degrees   Right Shoulder Flexion 131 Degrees   Right Shoulder ABduction 150 Degrees   Right Shoulder Internal Rotation 77 Degrees   Right Shoulder External Rotation 60 Degrees   Left Shoulder Extension 47 Degrees   Left Shoulder Flexion 127 Degrees   Left Shoulder ABduction 133 Degrees   Left Shoulder Internal Rotation 71 Degrees   Left Shoulder External Rotation 63 Degrees   Cervical Flexion WNl   Cervical Extension WNl   Cervical - Right Side Bend Wnl   Cervical - Left Side Bend WNl   Cervical - Right Rotation WNL   Cervical - Left Rotation 25% limited   Strength   Overall Strength Within functional limits for tasks performed           LYMPHEDEMA/ONCOLOGY QUESTIONNAIRE - 07/13/15 1315    Type   Cancer Type Right breast cancer   Lymphedema Assessments   Lymphedema Assessments Upper extremities   Right Upper Extremity Lymphedema   10 cm Proximal to Olecranon Process 30.6 cm   Olecranon Process 25.6 cm   10 cm Proximal to Ulnar Styloid Process 22.1 cm   Just Proximal to Ulnar Styloid Process 15.4 cm   Across Hand at [employer] 19.5 cm   At [place] of 2nd Digit 6.5 cm   Left Upper Extremity Lymphedema   10 cm Proximal to Olecranon Process 31.3 cm   Olecranon Process 25.2 cm   10 cm Proximal to Ulnar Styloid Process 21.2 cm   Just Proximal to Ulnar Styloid Process 14.9 cm   Across Hand at [employer] 18.9 cm   At [place] of 2nd Digit 6.4 cm      Patient was instructed today in a home exercise program today for post op shoulder range of motion. These included active assist shoulder flexion in sitting, scapular retraction, wall walking with shoulder abduction, and hands behind head external rotation.  She was encouraged to do these twice a day, holding 3 seconds and repeating 5 times when permitted by her physician.         PT Education - 07/13/15 1317    Education provided Yes   Education Details  Lymphedema risk reduction and post op shoulder ROM HEP   Person(s) Educated Patient;Child(ren)   Methods Explanation;Demonstration;Handout   Comprehension Returned demonstration;Verbalized understanding              Breast Clinic Goals - 07/13/15 1339    Patient will be able to verbalize understanding of pertinent lymphedema risk reduction practices relevant to her diagnosis specifically related to skin care.   Time 1   Period Days  Status Achieved   Patient will be able to return demonstrate and/or verbalize understanding of the post-op home exercise program related to regaining shoulder range of motion.   Time 1   Period Days   Status Achieved   Patient will be able to verbalize understanding of the importance of attending the postoperative After Breast Cancer Class for further lymphedema risk reduction education and therapeutic exercise.   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  The clips are 6 cm apart.  She is planning to have genetic testing and an MRI.  She will undergo either a right lumpectomy or mastectomy with a sentinel node biopsy followed by chemotherapy and radiation (if lumpectomy) and anti-estrogen therapy.  She may benefit from post op PT to regain shoulder ROM and reduce lymphedema risk.   Pt will benefit from skilled therapeutic intervention in order to improve on the following deficits Decreased strength;Pain;Decreased knowledge of precautions;Impaired UE functional use;Decreased range of motion   Rehab Potential Excellent   Clinical Impairments Affecting Rehab Potential none   PT Frequency One time visit   PT Treatment/Interventions Therapeutic  exercise;Patient/family education   PT Next Visit Plan Will f/u after surgery to determine needs   PT Home Exercise Plan Issued post op shoulder ROM HEP   Consulted and Agree with Plan of Care Patient;Family member/caregiver   Family Member Consulted adult children     Patient will follow up at outpatient cancer rehab if needed following surgery.  If the patient requires physical therapy at that time, a specific plan will be dictated and sent to the referring physician for approval. The patient was educated today on appropriate basic range of motion exercises to begin post operatively and the importance of attending the After Breast Cancer class following surgery.  Patient was educated today on lymphedema risk reduction practices as it pertains to recommendations that will benefit the patient immediately following surgery.  She verbalized good understanding.  No additional physical therapy is indicated at this time.         G-Codes - August 02, 2015 1339    Functional Assessment Tool Used Clinical Judgement   Functional Limitation Other PT primary   Other PT Primary Current Status [id number]) At least 1 percent but less than 20 percent impaired, limited or restricted   Other PT Primary Goal Status [id number]) At least 1 percent but less than 20 percent impaired, limited or restricted   Other PT Primary Discharge Status [id number]) At least 1 percent but less than 20 percent impaired, limited or restricted       Problem List Patient Active Problem List   Diagnosis Date Noted  . Breast cancer of lower-outer quadrant of right female breast ([place]) 07/08/2015  . Disturbance in sleep behavior 11/18/2014  . Fibromyalgia 10/04/2014  . Flu-like symptoms 07/09/2014  . Diverticulitis of colon (without mention of hemorrhage) 12/01/2013  . UTI (urinary tract infection) 12/01/2013  . Abdominal pain, unspecified site 12/01/2013  . Dry mouth 04/29/2013  . Dry eye 04/29/2013  . Dry skin 04/29/2013  . Obesity (BMI  30-39.9) 03/11/2013  . Sweating profusely 01/16/2013  . Grief 10/17/2012  .  Edema 09/04/2012  . Depression 09/04/2012  . Acute bronchitis 05/30/2012  . Peripheral neuropathy ([place]) 05/12/2012  . OAB (overactive bladder) 04/01/2012  . Hypokalemia 02/06/2012  . DM w/o complication type II ([place]) 02/06/2012  . Colitis 01/21/2012  . Physical exam, annual 10/22/2011  . Allergy to dogs 04/19/2011  . Polyarthralgia 09/18/2010  . Diverticulitis 09/10/2010  . Obesity 07/25/2010  . DIVERTICULOSIS, COLON 06/02/2010  . STRESS INCONTINENCE 04/25/2010  . ASTHMA 04/11/2010  . VERTIGO 04/11/2010  . ACUTE SINUSITIS, UNSPECIFIED 04/04/2010  . Hypothyroidism 01/19/2010  . Hyperlipidemia associated with type 2 diabetes mellitus ([place]) 01/19/2010  . DEPRESSION 01/19/2010  . Essential hypertension 01/19/2010  . GERD 01/19/2010  . PEPTIC ULCER DISEASE 01/19/2010  . OSTEOARTHRITIS 01/19/2010  . LOW BACK PAIN 01/19/2010    Annia Friendly, PT 07/13/2015 2:11 PM  [place], [place], [address] Phone: [phone]   Fax:  [phone]  Name: Tracey Evans MRN: [id number] Date of Birth: [birth year]

## 2015-07-13 NOTE — Progress Notes (Signed)
Subjective:     Patient ID: Tracey Evans, female   DOB: Aug 02, 1945, 70 y.o.   MRN: [id number]  HPI   Review of Systems     Objective:   Physical Exam For the patient to understand and be given the tools to implement a healthy plant based diet during their cancer diagnosis.     Assessment:     Patient was seen today and found to be in good spirits and accompanied by her family. Pts labs: CO2 30, glucose 148, GFR 69, A1C 6.3. Pts ht 5'5'', 207 pounds, BMI 34.7. Pts medications: atorvastatin, levothyroxine.     Plan:     Dietitian educated the patient on implementing a plant based diet by incorporating more plant proteins, fruits, and vegetables. As a part of a healthy routine physical activity was discussed. The importance of legitimate, evidence based information was discussed and examples were given. A folder of evidence based information with a focus on a plant based diet and general nutrition during cancer was given to the patient.  As a part of the continuum of care the cancer dietitian's contact information was given to the patient in the event they would like to have a follow up appointment.

## 2015-07-13 NOTE — Progress Notes (Addendum)
Radiation Oncology         [phone]) [phone] ________________________________  Name: Tracey Evans MRN: [id number]  Date: 07/13/2015  DOB: 1945/12/20  WU:[id number] Birdie Riddle, MD  Fanny Skates, MD     REFERRING PHYSICIAN: Fanny Skates, MD   DIAGNOSIS: The encounter diagnosis was Breast cancer of lower-outer quadrant of left female breast [facility]).   HISTORY OF PRESENT ILLNESS: Tracey Evans is a 70 y.o. female seen in the [facility] breast cancer clinic after undergoing a screening mammogram on 06/20/15 revealing possible mass and calcifications of the right breast. She did have an ultrasound that revealed a 18 mm posterior inferior right breast mass, and an indeterminate group of calcifications in the lower inner right breast about 6 cm away from the mass. No evidence of adenopathy was present. 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 ER and PR receptors were positive, and KI67 was 60%, HER2 was also positive. She comes today to the [facility] clinic for further evaluation and for discussion today with Dr. Lisbeth Renshaw for the possible role of radiotherapy.   PREVIOUS RADIATION THERAPY: No   PAST MEDICAL HISTORY:  Past Medical History  Diagnosis Date  . GERD (gastroesophageal reflux disease)   . Hyperlipidemia   . Hypertension   . Depression   . Fibromyalgia   . Peptic ulcer disease   . Hypothyroidism   . Low back pain   . Osteoarthritis   . Asthma   . Diverticular disease   . Complication of anesthesia     had bronc spasms during intubation surgery 2009 on foot-need albuterol inhaler or neb tx preop  . Angina at rest [facility])     "occurs whenever it wants to, but worse during agitation"  . Binge eating disorder   . Costochondritis   . Diabetes mellitus without complication ([place])   . Breast cancer of lower-outer quadrant of left female breast ([place]) 07/08/2015       PAST SURGICAL HISTORY: Past Surgical History    Procedure Laterality Date  . Cholecystectomy    . Abdominal hysterectomy    . Tonsillectomy    . Shoulder surgery  2009,2011    rt and lt  . Colonoscopy    . Ankle fracture surgery  2009    rt   . Ankle arthroscopy  01/02/2012    Procedure: ANKLE ARTHROSCOPY;  Surgeon: Colin Rhein, MD;  Location: [place];  Service: Orthopedics;  Laterality: Right;  right ankle arthroscopy with extensive debridement , dridement and drilling talar dome osteochondral lesion     FAMILY HISTORY:  Family History  Problem Relation Age of Onset  . Dementia Mother   . Dementia Father   . Alcohol abuse Father   . Cancer Sister     breast cancer  . Alcohol abuse Brother      SOCIAL HISTORY:  reports that she has never smoked. She does not have any smokeless tobacco history on file. She reports that she does not drink alcohol or use illicit drugs. The patient lives in [place], she is single.    ALLERGIES: Contrast media; Dilaudid; Adhesive; Codeine; Dilaudid; and Lactose intolerance (gi)   MEDICATIONS:  Current Outpatient Prescriptions  Medication Sig Dispense Refill  . albuterol (PROAIR HFA) 108 (90 BASE) MCG/ACT inhaler Inhale 2 puffs into the lungs every 6 (six) hours as needed for wheezing. 1 Inhaler 3  . ALPRAZolam (XANAX) 1 MG tablet TAKE 1 TABLET BY  MOUTH TWICE DAILY AS NEEDED FOR ANXIETY 60 tablet 3  . amLODipine-valsartan (EXFORGE) 5-160 MG tablet Take 1 tablet by mouth daily. 90 tablet 1  . aspirin 81 MG tablet Take 81 mg by mouth daily.    Marland Kitchen atorvastatin (LIPITOR) 20 MG tablet TAKE 1 TABLET BY MOUTH EVERY DAY 30 tablet 6  . buPROPion (WELLBUTRIN XL) 300 MG 24 hr tablet TAKE 1 TABLET BY MOUTH EVERY DAY 30 tablet 3  . clonazePAM (KLONOPIN) 0.5 MG tablet TAKE 1 TABLET BY MOUTH AT BEDTIME AS NEEDED 30 tablet 0  . cyclobenzaprine (FLEXERIL) 5 MG tablet Take 5 mg by mouth 2 (two) times daily as needed. For pain    . levothyroxine (SYNTHROID, LEVOTHROID) 75 MCG tablet TAKE  1 TABLET BY MOUTH DAILY. 30 tablet 6  . nitroGLYCERIN (NITROSTAT) 0.4 MG SL tablet Place 1 tablet (0.4 mg total) under the tongue every 5 (five) minutes as needed. As needed for chest pain 30 tablet 3  . promethazine (PHENERGAN) 12.5 MG tablet Take 12.5 mg by mouth every 6 (six) hours as needed. for nausea  0  . ranitidine (ZANTAC) 150 MG tablet Take 150 mg by mouth 2 (two) times daily.    Marland Kitchen tiZANidine (ZANAFLEX) 4 MG tablet Take 4 mg by mouth every 6 (six) hours as needed for muscle spasms.     No current facility-administered medications for this encounter.     REVIEW OF SYSTEMS: On review of systems, the patient states she is doing well. She denies any pain or bruising of her breast. No other complaints are verbalized.    PHYSICAL EXAM:   Pain scale 0/10 In general this is a well appearing African American Female in no acute distress. She's alert and oriented x4 and appropriate throughout the examination. Cardiopulmonary assessment is negative for acute distress and She exhibits normal effort.    ECOG = 0  0 - Asymptomatic (Fully active, able to carry on all predisease activities without restriction)  1 - Symptomatic but completely ambulatory (Restricted in physically strenuous activity but ambulatory and able to carry out work of a light or sedentary nature. For example, light housework, office work)  2 - Symptomatic, <50% in bed during the day (Ambulatory and capable of all self care but unable to carry out any work activities. Up and about more than 50% of waking hours)  3 - Symptomatic, >50% in bed, but not bedbound (Capable of only limited self-care, confined to bed or chair 50% or more of waking hours)  4 - Bedbound (Completely disabled. Cannot carry on any self-care. Totally confined to bed or chair)  5 - Death   Eustace Pen MM, Creech RH, Tormey DC, et al. [phone]). "Toxicity and response criteria of the [facility]". [place] Oncol. 5 (6):  649-55    LABORATORY DATA:  Lab Results  Component Value Date   WBC 8.7 12/09/2014   HGB 13.8 12/09/2014   HCT 42.7 12/09/2014   MCV 93.0 12/09/2014   PLT 303.0 12/09/2014   Lab Results  Component Value Date   NA 142 04/21/2015   K 3.5 04/21/2015   CL 108 04/21/2015   CO2 28 04/21/2015   Lab Results  Component Value Date   ALT 21 12/09/2014   AST 17 12/09/2014   ALKPHOS 107 12/09/2014   BILITOT 0.5 12/09/2014      RADIOGRAPHY: Mm Digital Diagnostic Unilat R  07/06/2015  CLINICAL DATA:  Patient status post stereotactic guided core needle biopsy right breast  calcifications and ultrasound-guided biopsy right breast mass. EXAM: DIAGNOSTIC RIGHT MAMMOGRAM POST ULTRASOUND AND STEREOTACTIC BIOPSY COMPARISON:  Previous exam(s). FINDINGS: Mammographic images were obtained following ultrasound guided biopsy of right breast calcifications, lower inner quadrant. X shaped marking clip in appropriate position. Mammographic images obtained following stereotactic guided biopsy of right breast mass, posterior location. Ribbon shaped marking clip in appropriate position, visualized only on the CC view given posterior location of the mass. IMPRESSION: Appropriate position biopsy marking clip status post ultrasound and stereotactic guided core needle biopsies right breast. Final Assessment: Post Procedure Mammograms for Marker Placement Electronically Signed   By: Lovey Newcomer M.D.   On: 07/06/2015 13:22   Mm Digital Screening Bilateral  06/21/2015  CLINICAL DATA:  Screening. EXAM: DIGITAL SCREENING BILATERAL MAMMOGRAM WITH CAD COMPARISON:  Previous exam(s). ACR Breast Density Category b: There are scattered areas of fibroglandular density. FINDINGS: In the right breast, a possible mass and calcifications warrant further evaluation. In the left breast, no findings suspicious for malignancy. Images were processed with CAD. IMPRESSION: Further evaluation is suggested for possible mass and calcifications in  the right breast. RECOMMENDATION: Diagnostic mammogram and possibly ultrasound of the right breast. (Code:[id number]) The patient will be contacted regarding the findings, and additional imaging will be scheduled. BI-RADS CATEGORY  0: Incomplete. Need additional imaging evaluation and/or prior mammograms for comparison. Electronically Signed   By: Dorise Bullion III M.D   On: 06/21/2015 17:17   US Breast Ltd Uni Right Inc Axilla  06/28/2015  CLINICAL DATA:  Screening recall for a possible right breast mass as well as right breast calcifications. EXAM: 2D DIGITAL DIAGNOSTIC RIGHT MAMMOGRAM WITH ADJUNCT TOMO ULTRASOUND RIGHT BREAST COMPARISON:  Previous exam(s). ACR Breast Density Category b: There are scattered areas of fibroglandular density. FINDINGS: Mass noted along the posterior inferior margin of the right breast on the screening study not well imaged on the diagnostic views due to its posterior location, but does persist. Margins are irregular. Small group of calcifications noted in the lower inner aspect of the right breast are better visualized on the magnification views. The span 5 mm. These are primarily punctate with no associated mass or linear or branching arrangement. No areas of architectural distortion. No other masses. No other suspicious calcifications. On physical exam, no mass is palpated along the inferior aspect of the right breast. Targeted ultrasound is performed, showing a hypoechoic, taller than wide, partly shadowing mass with irregular ill-defined margins measuring 18 x 12 x 13 mm. Evaluation of the right axilla shows no enlarged or abnormal appearing lymph nodes. IMPRESSION: 1. 18 mm posterior inferior right breast mass, highly suspicious for breast carcinoma. Biopsy is indicated. 2. Indeterminate group of calcifications in the lower inner right breast. Although these are most likely benign, biopsy is recommended to aid in staging for the presumed inferior breast malignancy.  RECOMMENDATION: 1. Ultrasound-guided core needle biopsy of the inferior right breast mass. 2. Stereotactic core needle biopsy of the right breast calcifications. I have discussed the findings and recommendations with the patient. Results were also provided in writing at the conclusion of the visit. If applicable, a reminder letter will be sent to the patient regarding the next appointment. BI-RADS CATEGORY  5: Highly suggestive of malignancy. Electronically Signed   By: Lajean Manes M.D.   On: 06/28/2015 15:08   Mm Diag Breast Tomo Uni Right  06/28/2015  CLINICAL DATA:  Screening recall for a possible right breast mass as well as right breast calcifications. EXAM: 2D DIGITAL DIAGNOSTIC  RIGHT MAMMOGRAM WITH ADJUNCT TOMO ULTRASOUND RIGHT BREAST COMPARISON:  Previous exam(s). ACR Breast Density Category b: There are scattered areas of fibroglandular density. FINDINGS: Mass noted along the posterior inferior margin of the right breast on the screening study not well imaged on the diagnostic views due to its posterior location, but does persist. Margins are irregular. Small group of calcifications noted in the lower inner aspect of the right breast are better visualized on the magnification views. The span 5 mm. These are primarily punctate with no associated mass or linear or branching arrangement. No areas of architectural distortion. No other masses. No other suspicious calcifications. On physical exam, no mass is palpated along the inferior aspect of the right breast. Targeted ultrasound is performed, showing a hypoechoic, taller than wide, partly shadowing mass with irregular ill-defined margins measuring 18 x 12 x 13 mm. Evaluation of the right axilla shows no enlarged or abnormal appearing lymph nodes. IMPRESSION: 1. 18 mm posterior inferior right breast mass, highly suspicious for breast carcinoma. Biopsy is indicated. 2. Indeterminate group of calcifications in the lower inner right breast. Although these  are most likely benign, biopsy is recommended to aid in staging for the presumed inferior breast malignancy. RECOMMENDATION: 1. Ultrasound-guided core needle biopsy of the inferior right breast mass. 2. Stereotactic core needle biopsy of the right breast calcifications. I have discussed the findings and recommendations with the patient. Results were also provided in writing at the conclusion of the visit. If applicable, a reminder letter will be sent to the patient regarding the next appointment. BI-RADS CATEGORY  5: Highly suggestive of malignancy. Electronically Signed   By: Lajean Manes M.D.   On: 06/28/2015 15:08   Mm Rt Breast Bx W Loc Dev 1st Lesion Image Bx Spec Stereo Guide  07/06/2015  CLINICAL DATA:  Indeterminate right breast calcifications. EXAM: RIGHT BREAST STEREOTACTIC CORE NEEDLE BIOPSY COMPARISON:  Previous exams. FINDINGS: The patient and I discussed the procedure of stereotactic-guided biopsy including benefits and alternatives. We discussed the high likelihood of a successful procedure. We discussed the risks of the procedure including infection, bleeding, tissue injury, clip migration, and inadequate sampling. Informed written consent was given. The usual time out protocol was performed immediately prior to the procedure. Using sterile technique and 1% Lidocaine as local anesthetic, under stereotactic guidance, a none gauge vacuum assisted core needle biopsy device was used to perform core needle biopsy of calcifications within the lower inner right breast middle depth using a medial approach. Specimen radiograph was performed showing calcifications. Specimens with calcifications are identified for pathology. At the conclusion of the procedure, a X shaped tissue marker clip was deployed into the biopsy cavity. Follow-up 2-view mammogram was performed and dictated separately. IMPRESSION: Stereotactic-guided biopsy of right breast calcifications. No apparent complications. Electronically  Signed   By: Lovey Newcomer M.D.   On: 07/06/2015 13:20   [place] Rt Breast Bx W Loc Dev 1st Lesion Img Bx Spec US Guide  07/06/2015  CLINICAL DATA:  Suspicious right breast mass. EXAM: ULTRASOUND GUIDED RIGHT BREAST CORE NEEDLE BIOPSY COMPARISON:  Previous exam(s). FINDINGS: I met with the patient and we discussed the procedure of ultrasound-guided biopsy, including benefits and alternatives. We discussed the high likelihood of a successful procedure. We discussed the risks of the procedure, including infection, bleeding, tissue injury, clip migration, and inadequate sampling. Informed written consent was given. The usual time-out protocol was performed immediately prior to the procedure. Using sterile technique and 1% Lidocaine as local anesthetic, under direct ultrasound  visualization, a 14 gauge spring-loaded device was used to perform biopsy of right breast mass using a lateral approach. At the conclusion of the procedure a X shaped tissue marker clip was deployed into the biopsy cavity. Follow up 2 view mammogram was performed and dictated separately. IMPRESSION: Ultrasound guided biopsy of right breast mass. No apparent complications. Electronically Signed   By: Lovey Newcomer M.D.   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 Lisbeth Renshaw discusses the pathology findings and reviews the nature of invasive cancer and DCIS in particularly with two separation cancers. The consensus from the breast conference included  MRI of the breast to rule out pectoral invasion followed by mastectomy versus conservative surgery. The patient has met with Dr. Dalbert Batman and plans to undergo mastectomy. In terms of radiotherapy options, we would not recommend adjuvant radiotherapy following mastectomy unless there was nodal involvement or positive margins requiring treatment in the future. We will follow up with  the results of her pathology when available. Dr. Lindi Adie has also discussed oncotype, antiestrogen therapy, and Herceptin with the patient. The patient will also receive a PAC at the time of surgery for Herceptin administration. At the end of the conversation, she states agreement and an understanding of the outlined plan.  The above documentation reflects my direct findings during this shared patient visit. Please see the separate note by Dr. Lisbeth Renshaw on this date for the remainder of the patient's plan of care.    Carola Rhine, PAC

## 2015-07-13 NOTE — Progress Notes (Signed)
[name] NOTE  Patient Care Team: Midge Minium, MD as PCP - General [name], MD as Consulting Physician (Cardiology)  CHIEF COMPLAINTS/PURPOSE OF CONSULTATION:  Newly diagnosed breast cancer  HISTORY OF PRESENTING ILLNESS:  Tracey Evans 70 y.o. female is here because of recent diagnosis of right breast cancer. She had a routine screening mammogram that revealed an abnormality in the right breast towards the posterior aspect. 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 The ratio was 1.42. She has presented this morning in the multidisciplinary tumor board and she is here today to discuss the treatment plan accompanied by her son and daughter.  I reviewed her records extensively and collaborated the history with the patient.  SUMMARY OF ONCOLOGIC HISTORY:   Breast cancer of lower-outer quadrant of right female breast ([place])   07/06/2015 Initial Diagnosis Right breast biopsy posterior: IDC ER 90%, PR 5%, Ki-67 60%, HER-2 positive ratio 1.42,copy #6.1 T1c N0 stage IA; Right breast biopsy inferior medial: High-grade DCIS with comedonecrosis; ER 100%, PR 90%; 5 mm calcs    In terms of breast cancer risk profile:  She menarched at early age of 64 and went to menopause at age 1  She had 3 pregnancy, her first child was born at age 33  She has not received birth control pills.  She was never exposed to fertility medications or hormone replacement therapy.  She has  family history of Breast/GYN/GI cancer Her sister age 30 inflammatory breast cancer and another sister at 63 with unknown cancer  MEDICAL HISTORY:  Past Medical History  Diagnosis Date  . GERD (gastroesophageal reflux disease)   . Hyperlipidemia   . Hypertension   . Depression   . Fibromyalgia   . Peptic ulcer disease   .  Hypothyroidism   . Low back pain   . Osteoarthritis   . Asthma   . Diverticular disease   . Complication of anesthesia     had bronc spasms during intubation surgery 2009 on foot-need albuterol inhaler or neb tx preop  . Angina at rest [facility])     "occurs whenever it wants to, but worse during agitation"  . Binge eating disorder   . Costochondritis   . Diabetes mellitus without complication ([place])   . Breast cancer of lower-outer quadrant of left female breast ([place]) 07/08/2015  . Anxiety   . Hot flashes     SURGICAL HISTORY: Past Surgical History  Procedure Laterality Date  . Cholecystectomy    . Abdominal hysterectomy    . Tonsillectomy    . Shoulder surgery  2009,2011    rt and lt  . Colonoscopy    . Ankle fracture surgery  2009    rt   . Ankle arthroscopy  01/02/2012    Procedure: ANKLE ARTHROSCOPY;  Surgeon: Colin Rhein, MD;  Location: [place];  Service: Orthopedics;  Laterality: Right;  right ankle arthroscopy with extensive debridement , dridement and drilling talar dome osteochondral lesion    SOCIAL HISTORY: Social History   Social History  . Marital Status: Single    Spouse Name: N/A  . Number of Children: N/A  . Years of Education: N/A   Occupational History  . Not on file.   Social History Main Topics  . Smoking status: Never Smoker   .  Smokeless tobacco: Not on file  . Alcohol Use: 0.0 oz/week    0 Standard drinks or equivalent per week     Comment: social  . Drug Use: No  . Sexual Activity: Not on file   Other Topics Concern  . Not on file   Social History Narrative    FAMILY HISTORY: Family History  Problem Relation Age of Onset  . Dementia Mother   . Dementia Father   . Alcohol abuse Father   . Cancer Sister     breast cancer  . Alcohol abuse Brother     ALLERGIES:  is allergic to contrast media; dilaudid; adhesive; codeine; dilaudid; and lactose intolerance (gi).  MEDICATIONS:  Current Outpatient Prescriptions   Medication Sig Dispense Refill  . albuterol (PROAIR HFA) 108 (90 BASE) MCG/ACT inhaler Inhale 2 puffs into the lungs every 6 (six) hours as needed for wheezing. 1 Inhaler 3  . ALPRAZolam (XANAX) 1 MG tablet TAKE 1 TABLET BY MOUTH TWICE DAILY AS NEEDED FOR ANXIETY 60 tablet 3  . amLODipine-valsartan (EXFORGE) 5-160 MG tablet Take 1 tablet by mouth daily. 90 tablet 1  . aspirin 81 MG tablet Take 81 mg by mouth daily.    Marland Kitchen atorvastatin (LIPITOR) 20 MG tablet TAKE 1 TABLET BY MOUTH EVERY DAY 30 tablet 6  . buPROPion (WELLBUTRIN XL) 300 MG 24 hr tablet TAKE 1 TABLET BY MOUTH EVERY DAY 30 tablet 3  . clonazePAM (KLONOPIN) 0.5 MG tablet TAKE 1 TABLET BY MOUTH AT BEDTIME AS NEEDED 30 tablet 0  . cyclobenzaprine (FLEXERIL) 5 MG tablet Take 5 mg by mouth 2 (two) times daily as needed. For pain    . diclofenac sodium (VOLTAREN) 1 % GEL Place onto the skin.    Marland Kitchen levothyroxine (SYNTHROID, LEVOTHROID) 75 MCG tablet TAKE 1 TABLET BY MOUTH DAILY. 30 tablet 6  . meclizine (ANTIVERT) 25 MG tablet Take by mouth.    . meloxicam (MOBIC) 15 MG tablet     . nitroGLYCERIN (NITROSTAT) 0.4 MG SL tablet Place 1 tablet (0.4 mg total) under the tongue every 5 (five) minutes as needed. As needed for chest pain 30 tablet 3  . ondansetron (ZOFRAN) 8 MG tablet Take by mouth.    Vladimir Faster Glycol-Propyl Glycol (SYSTANE) 0.4-0.3 % GEL ophthalmic gel Apply to eye.    . promethazine (PHENERGAN) 12.5 MG tablet Take 12.5 mg by mouth every 6 (six) hours as needed. for nausea  0  . ranitidine (ZANTAC) 150 MG tablet Take 150 mg by mouth 2 (two) times daily.    Marland Kitchen tiZANidine (ZANAFLEX) 4 MG tablet Take 4 mg by mouth every 6 (six) hours as needed for muscle spasms.    . traMADol (ULTRAM) 50 MG tablet Take 50 mg by mouth 2 (two) times daily as needed.  0  . anastrozole (ARIMIDEX) 1 MG tablet Take 1 tablet (1 mg total) by mouth daily. 30 tablet 0   No current facility-administered medications for this visit.    REVIEW OF SYSTEMS:    Constitutional: Denies fevers, chills or abnormal night sweats Eyes: Denies blurriness of vision, double vision or watery eyes Ears, nose, mouth, throat, and face: Denies mucositis or sore throat Respiratory: Denies cough, dyspnea or wheezes Cardiovascular: Denies palpitation, chest discomfort or lower extremity swelling Gastrointestinal:  Denies nausea, heartburn or change in bowel habits Skin: Denies abnormal skin rashes Lymphatics: Denies new lymphadenopathy or easy bruising Neurological:Denies numbness, tingling or new weaknesses, Complains of diffuse myalgias and arthralgias which she attributes to fibromyalgia  Behavioral/Psych: Mood is stable, no new changes  Breast:  Denies any palpable lumps or discharge All other systems were reviewed with the patient and are negative.  PHYSICAL EXAMINATION: ECOG PERFORMANCE STATUS: 1 - Symptomatic but completely ambulatory  Filed Vitals:   07/13/15 0907  BP: 126/88  Pulse: 81  Temp: 98 F (36.7 C)  Resp: 20   Filed Weights   07/13/15 0907  Weight: 207 lb 14.4 oz (94.303 kg)    GENERAL:alert, no distress and comfortable SKIN: skin color, texture, turgor are normal, no rashes or significant lesions EYES: normal, conjunctiva are pink and non-injected, sclera clear OROPHARYNX:no exudate, no erythema and lips, buccal mucosa, and tongue normal  NECK: supple, thyroid normal size, non-tender, without nodularity LYMPH:  no palpable lymphadenopathy in the cervical, axillary or inguinal LUNGS: clear to auscultation and percussion with normal breathing effort HEART: regular rate & rhythm and no murmurs and no lower extremity edema ABDOMEN:abdomen soft, non-tender and normal bowel sounds Musculoskeletal:no cyanosis of digits and no clubbing  PSYCH: alert & oriented x 3 with fluent speech NEURO: no focal motor/sensory deficits BREAST:Palpable lump in the right breast posteriorly. No palpable axillary or supraclavicular lymphadenopathy (exam  performed in the presence of a chaperone)   LABORATORY DATA:  I have reviewed the data as listed Lab Results  Component Value Date   WBC 6.4 07/13/2015   HGB 13.3 07/13/2015   HCT 40.6 07/13/2015   MCV 94.2 07/13/2015   PLT 299 07/13/2015   Lab Results  Component Value Date   NA 144 07/13/2015   K 3.7 07/13/2015   CL 108 04/21/2015   CO2 30* 07/13/2015    RADIOGRAPHIC STUDIES: I have personally reviewed the radiological reports and agreed with the findings in the report.  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 After reviewing the pathology in detail, we proceeded to discuss the different treatment options between surgery, radiation, chemotherapy, antiestrogen therapies.  Recommendation based on multidisciplinary tumor board:  Breast MRI 1. Genetic counseling and testing 2. Breast conserving surgery versus mastectomy based upon breast MRI result 3. Adjuvant chemotherapy with Taxol and Herceptin (if the final tumor less than 2 cm) versus TCH (>2 cm) 4. Followed by radiation therapy if she undergoes lumpectomy 5. Followed by antiestrogen therapy with anastrozole 5 years  Since it is expected to take at least 3-4 weeks until her surgery, I recommended starting the patient on anastrozole therapy. She will take it up until the surgery and then discontinue it. I discussed risks and benefits of anastrozole including hot flashes, myalgias, osteoporosis risk along with mood  swings. He understands this and is consented to receive treatment. I sent the prescription to her pharmacy.  Return to clinic after surgery to discuss the final pathology report and to finalize a treatment plan.    All questions were answered. The patient knows to call the clinic with any problems, questions or concerns.    Rulon Eisenmenger, MD 07/13/2015  '

## 2015-07-13 NOTE — Assessment & Plan Note (Signed)
Right breast biopsy posterior 07/06/2015: IDC ER 90%, PR 5%, Ki-67 60%, HER-2 positive ratio 1.42,copy #6.1 T1c N0 stage IA; Right breast biopsy inferior medial: High-grade DCIS with comedonecrosis; ER 100%, PR 90%; 5 mm calcifications  Pathology and radiology counseling: Discussed with the patient, the details of pathology including the type of breast cancer,the clinical staging, the significance of ER, PR and HER-2/neu receptors and the implications for treatment. After reviewing the pathology in detail, we proceeded to discuss the different treatment options between surgery, radiation, chemotherapy, antiestrogen therapies.  Recommendation based on multidisciplinary tumor board: 1. Genetic counseling and testing 2. Breast conserving surgery versus mastectomy based upon breast MRI result 3. Adjuvant chemotherapy with Taxol and Herceptin versus TCH 4. Followed by radiation therapy if she undergoes lumpectomy 5. Followed by antiestrogen therapy with anastrozole 5 years  Return to clinic after surgery to discuss the final pathology report and to finalize a treatment plan.

## 2015-07-14 ENCOUNTER — Ambulatory Visit (HOSPITAL_BASED_OUTPATIENT_CLINIC_OR_DEPARTMENT_OTHER): Payer: Medicare Other | Admitting: Genetic Counselor

## 2015-07-14 ENCOUNTER — Encounter: Payer: Self-pay | Admitting: *Deleted

## 2015-07-14 ENCOUNTER — Other Ambulatory Visit: Payer: Medicare Other

## 2015-07-14 ENCOUNTER — Encounter: Payer: Self-pay | Admitting: Genetic Counselor

## 2015-07-14 DIAGNOSIS — C50411 Malignant neoplasm of upper-outer quadrant of right female breast: Secondary | ICD-10-CM | POA: Diagnosis not present

## 2015-07-14 DIAGNOSIS — Z800 Family history of malignant neoplasm of digestive organs: Secondary | ICD-10-CM

## 2015-07-14 DIAGNOSIS — Z315 Encounter for genetic counseling: Secondary | ICD-10-CM | POA: Diagnosis present

## 2015-07-14 DIAGNOSIS — Z803 Family history of malignant neoplasm of breast: Secondary | ICD-10-CM | POA: Diagnosis not present

## 2015-07-14 DIAGNOSIS — C50511 Malignant neoplasm of lower-outer quadrant of right female breast: Secondary | ICD-10-CM

## 2015-07-15 ENCOUNTER — Encounter: Payer: Self-pay | Admitting: Genetic Counselor

## 2015-07-15 DIAGNOSIS — Z800 Family history of malignant neoplasm of digestive organs: Secondary | ICD-10-CM | POA: Insufficient documentation

## 2015-07-15 DIAGNOSIS — Z803 Family history of malignant neoplasm of breast: Secondary | ICD-10-CM | POA: Insufficient documentation

## 2015-07-15 NOTE — Progress Notes (Signed)
REFERRING PROVIDER: Nicholas Lose, MD  PRIMARY PROVIDER:  Annye Asa, MD  PRIMARY REASON FOR VISIT:  1. Breast cancer of lower-outer quadrant of right female breast ([place])   2. Family history of breast cancer in female   3. Family history of colon cancer      HISTORY OF PRESENT ILLNESS:   Tracey Evans, a 70 y.o. female, was seen for a [place] cancer genetics consultation at the request of Dr. Lindi Adie due to a personal history of breast cancer and family history of breast and colon cancers.  Ms. Bellissimo presents to clinic today to discuss the possibility of a hereditary predisposition to cancer, genetic testing, and to further clarify her future cancer risks, as well as potential cancer risks for family members.   In March 2017, at the age of 71, Ms. Angelica was diagnosed with invasive ductal carcinoma and DCIS of the right breast with comedo necrosis.  Hormone receptor status was triple positive.  Surgical and treatment decisions will be informed by genetic test results.   CANCER HISTORY:    Breast cancer of lower-outer quadrant of right female breast ([place])   07/06/2015 Initial Diagnosis Right breast biopsy posterior: IDC ER 90%, PR 5%, Ki-67 60%, HER-2 positive ratio 1.42,copy #6.1 T1c N0 stage IA; Right breast biopsy inferior medial: High-grade DCIS with comedonecrosis; ER 100%, PR 90%; 5 mm calcs     HORMONAL RISK FACTORS:  Menarche was at age 46.  First live birth at age 89.  OCP use for approximately 0 years.  Ovaries intact: yes.  Hysterectomy: yes, in 1984, for abnormal bleeding and pain Menopausal status: postmenopausal.  HRT use: 0 years. Colonoscopy: yes; normal other than presence of diverticulosis Mammogram within the last year: was receiving every 2 years. Number of breast biopsies: 1. Up to date with pelvic exams:  n/a. Any excessive radiation exposure in the past:  no, but does report history of secondhand smoke exposure  Past Medical History   Diagnosis Date  . GERD (gastroesophageal reflux disease)   . Hyperlipidemia   . Hypertension   . Depression   . Fibromyalgia   . Peptic ulcer disease   . Hypothyroidism   . Low back pain   . Osteoarthritis   . Asthma   . Diverticular disease   . Complication of anesthesia     had bronc spasms during intubation surgery 2009 on foot-need albuterol inhaler or neb tx preop  . Angina at rest [facility])     "occurs whenever it wants to, but worse during agitation"  . Binge eating disorder   . Costochondritis   . Diabetes mellitus without complication ([place])   . Breast cancer of lower-outer quadrant of left female breast ([place]) 07/08/2015  . Anxiety   . Hot flashes     Past Surgical History  Procedure Laterality Date  . Cholecystectomy    . Abdominal hysterectomy    . Tonsillectomy    . Shoulder surgery  2009,2011    rt and lt  . Colonoscopy    . Ankle fracture surgery  2009    rt   . Ankle arthroscopy  01/02/2012    Procedure: ANKLE ARTHROSCOPY;  Surgeon: Colin Rhein, MD;  Location: [place];  Service: Orthopedics;  Laterality: Right;  right ankle arthroscopy with extensive debridement , dridement and drilling talar dome osteochondral lesion    Social History   Social History  . Marital Status: Single    Spouse Name: N/A  . Number of Children:  N/A  . Years of Education: N/A   Social History Main Topics  . Smoking status: Never Smoker   . Smokeless tobacco: Never Used  . Alcohol Use: 0.0 oz/week    0 Standard drinks or equivalent per week     Comment: socially - wine  . Drug Use: No  . Sexual Activity: Not Asked   Other Topics Concern  . None   Social History Narrative     FAMILY HISTORY:  We obtained a detailed, 4-generation family history.  Significant diagnoses are listed below: Family History  Problem Relation Age of Onset  . Dementia Mother   . Stroke Mother   . Heart Problems Mother   . Dementia Father   . Alcohol abuse Father   .  Colon cancer Father     dx. 18 or younger  . Breast cancer Sister 17    inflammatory  . Diverticulitis Sister     maternal half-sister; severe - causing partial colectomy  . Breast cancer Sister     maternal half-sister; dx. early 67s  . Breast cancer Other 72    niece  . Breast cancer Other     niece dx. 25s  . Alcohol abuse Brother     Ms. Cohill has twins (a son and daughter) who are 54 and on other daughter who is 94.  Her oldest daughter has a history of fallopian tube obstruction.  Ms. Sanagustin has two full sisters, two full brothers, and two maternal half-sisters.  One full sister was diagnosed with inflammatory breast cancer at the age of 6 and passed away at 75.  This sister has one son and one daughter, and her daughter has a history of benign breast tumors.  Another full sister is currently 73 and has never had cancer.  One full brother died of liver cirrhosis in his 71s; another full brother is currently 72 and has never had cancer.  The latter brother has two daughters, one of whom died of breast cancer at the age of 26. One maternal half-sister was diagnosed with breast cancer in her early 86s and passed away at 12.  She had no children.  The other maternal half-sister has a history of diverticulitis, but no history of cancer.  This half-sister has one son and two daughters.  One of her daughters was diagnsoed with breast cancer in her 8s.  Ms. Matzek mother died of a stroke and heart problems at the age of 35.  She had three full sisters and three full brothers, all of whom have past away, most in later ages.  One sister died of an unspecified cause at age 40-15.  Ms. Radcliffe reports no known cancers in any of her maternal first cousins.  Her maternal grandmother died when her mother was 43 from an unspecified cause.  Her maternal grandfather died in his early 63s, but never had cancer.  She has no further information for any maternal great aunts/uncles or great  grandparents.  Ms. Walth father reportedly had several colon surgeries and died of colon cancer in his 42s.  Ms. Pettinato father had one full sister who died in her late 59s-70s.  Ms. Croucher has no information for any paternal first cousins.  Ms. Laughman paternal grandmother died of gangrene or diabetes at the age of 57.  Her paternal grandfather died of an unspecified cause.  She has no further information for her paternal great aunts/uncles or great grandparents.    Ms. Criss reports no known family history  of genetic testing for hereditary cancer.  Patient's maternal ancestors are of [place] American and Caucasian descent, and paternal ancestors are of African American descent. There is no reported Ashkenazi Jewish ancestry. There is no known consanguinity.  GENETIC COUNSELING ASSESSMENT: KENYIA WAMBOLT is a 70 y.o. female with a personal and family history of breast cancer which is somewhat suggestive of a hereditary cancer syndrome and predisposition to cancer. We, therefore, discussed and recommended the following at today's visit.   DISCUSSION: We reviewed the characteristics, features and inheritance patterns of hereditary cancer syndromes, particularly those caused by mutations within the BRCA1/2 and CHEK2 genes. We also discussed genetic testing, including the appropriate family members to test, the process of testing, insurance coverage and turn-around-time for results. We discussed the implications of a negative, positive and/or variant of uncertain significant result. We recommended Ms. Prothero pursue genetic testing for the 20-gene Breast/Ovarian Cancer Panel through [facility].  The Breast/Ovarian Cancer Panel offered by [facility] Hope Pigeon, MD) includes sequencing and deletion/duplication analysis for the following 19 genes:  ATM, BARD1, BRCA1, BRCA2, BRIP1, CDH1, CHEK2, FANCC, MLH1, MSH2, MSH6, NBN, PALB2, PMS2, PTEN, RAD51C, RAD51D, TP53, and  XRCC2.  This panel also includes deletion/duplication analysis (without sequencing) for one gene, EPCAM.  Based on Ms. Hulon's personal and family history of cancer, she meets medical criteria for genetic testing. Despite that she meets criteria, she may still have an out of pocket cost. We discussed that if her out of pocket cost for testing is over $100, the laboratory will call and confirm whether she wants to proceed with testing.  If the out of pocket cost of testing is less than $100 she will be billed by the genetic testing laboratory.   PLAN: After considering the risks, benefits, and limitations, Ms. Wilcher  provided informed consent to pursue genetic testing and the blood sample was sent to [facility] for analysis of the 20-gene Breast/Ovarian Cancer Panel. Results should be available within approximately 2 weeks' time, at which point they will be disclosed by telephone to Ms. Aguinaldo, as will any additional recommendations warranted by these results. Ms. Yeakel will receive a summary of her genetic counseling visit and a copy of her results once available. This information will also be available in Epic. We encouraged Ms. Poust to remain in contact with cancer genetics annually so that we can continuously update the family history and inform her of any changes in cancer genetics and testing that may be of benefit for her family. Ms. Mihalic questions were answered to her satisfaction today. Our contact information was provided should additional questions or concerns arise.  Thank you for the referral and allowing [place] to share in the care of your patient.   Jeanine Luz, MS, [facility] Certified Genetic Counselor [email or web address] Phone: [phone]  The patient was seen for a total of 70 minutes in face-to-face genetic counseling.  This patient was discussed with Drs. Magrinat, Lindi Adie and/or Burr Medico who agrees with the above.     _______________________________________________________________________ For Office Staff:  Number of people involved in session: 1 Was an Intern/ student involved with case: no

## 2015-07-18 ENCOUNTER — Telehealth: Payer: Self-pay | Admitting: *Deleted

## 2015-07-18 NOTE — Telephone Encounter (Signed)
Spoke with patient from [facility] 07/13/15.  She is doing well just anxious to get started.  Encouraged her to call with any needs or concerns.

## 2015-07-19 ENCOUNTER — Ambulatory Visit
Admission: RE | Admit: 2015-07-19 | Discharge: 2015-07-19 | Disposition: A | Discharge status: Home / Self Care | Payer: Medicare Other | Source: Ambulatory Visit | Attending: Hematology and Oncology | Admitting: Hematology and Oncology

## 2015-07-19 ENCOUNTER — Encounter: Payer: Self-pay | Admitting: *Deleted

## 2015-07-19 ENCOUNTER — Other Ambulatory Visit: Payer: Medicare Other

## 2015-07-19 DIAGNOSIS — D0512 Intraductal carcinoma in situ of left breast: Secondary | ICD-10-CM | POA: Diagnosis not present

## 2015-07-19 DIAGNOSIS — C50512 Malignant neoplasm of lower-outer quadrant of left female breast: Secondary | ICD-10-CM

## 2015-07-19 MED ORDER — GADOBENATE DIMEGLUMINE 529 MG/ML IV SOLN
19.0000 mL | Freq: Once | INTRAVENOUS | Status: AC | PRN
  Administered 2015-07-19: 19 mL via INTRAVENOUS

## 2015-07-20 ENCOUNTER — Encounter: Payer: PRIVATE HEALTH INSURANCE | Admitting: Family Medicine

## 2015-07-25 ENCOUNTER — Encounter: Payer: Self-pay | Admitting: Family Medicine

## 2015-07-25 ENCOUNTER — Ambulatory Visit (INDEPENDENT_AMBULATORY_CARE_PROVIDER_SITE_OTHER): Payer: Medicare Other | Admitting: Family Medicine

## 2015-07-25 VITALS — BP 123/88 | HR 81 | Temp 98.3°F | Resp 16 | Ht 65.0 in | Wt 206.2 lb

## 2015-07-25 DIAGNOSIS — E038 Other specified hypothyroidism: Secondary | ICD-10-CM | POA: Diagnosis not present

## 2015-07-25 DIAGNOSIS — E1169 Type 2 diabetes mellitus with other specified complication: Secondary | ICD-10-CM | POA: Diagnosis not present

## 2015-07-25 DIAGNOSIS — F329 Major depressive disorder, single episode, unspecified: Secondary | ICD-10-CM

## 2015-07-25 DIAGNOSIS — E119 Type 2 diabetes mellitus without complications: Secondary | ICD-10-CM | POA: Diagnosis not present

## 2015-07-25 DIAGNOSIS — F32A Depression, unspecified: Secondary | ICD-10-CM

## 2015-07-25 DIAGNOSIS — E785 Hyperlipidemia, unspecified: Secondary | ICD-10-CM

## 2015-07-25 DIAGNOSIS — I10 Essential (primary) hypertension: Secondary | ICD-10-CM | POA: Diagnosis not present

## 2015-07-25 DIAGNOSIS — Z0000 Encounter for general adult medical examination without abnormal findings: Secondary | ICD-10-CM | POA: Diagnosis not present

## 2015-07-25 LAB — TSH: TSH: 1.25 m[IU]/L

## 2015-07-25 NOTE — Progress Notes (Signed)
   Subjective:    Patient ID: Tracey Evans, female    DOB: 1945-04-26, 70 y.o.   MRN: [id number]  HPI Here today for CPE.  Risk Factors: DM- chronic problem, diet controlled.  On ARB for renal protection.  UTD on foot exam and eye exam.  Admits to recent stress eating w/ breast cancer dx.  Not able to exercise due to chronic back pain. Hyperlipidemia- chronic problem, on Lipitor.  Denies abd pain, N/V.  Unable to exercise due to chronic back pain Hypothyroid- chronic problem, on Levothyroxine.  Denies fatigue, heat/cold intolerance, changes to skin/hair/nails HTN- chronic problem, on Amlodipine-Valsartan.  Denies CP, SOB, HAs, visual changes, edema.  Physical Activity:  Fall Risk: low risk Depression: chronic problem, on Wellbutrin, Clonazepam. Hearing: normal to conversational tones, whispered voice at 6 ft ADL's: independent Cognitive: normal linear thought process, memory and attention intact Home Safety: safe at home, lives alone Height, Weight, BMI, Visual Acuity: see vitals, vision corrected to 20/20 w/ glasses Counseling: UTD on mammo, colonoscopy, foot and eye exams, immunizations Care team reviewed and updated w/ pt Labs Ordered: See A&P Care Plan: See A&P    Review of Systems Patient reports no vision/ hearing changes, adenopathy,fever, weight change,  persistant/recurrent hoarseness , swallowing issues, chest pain, palpitations, edema, persistant/recurrent cough, hemoptysis, dyspnea (rest/exertional/paroxysmal nocturnal), gastrointestinal bleeding (melena, rectal bleeding), abdominal pain, significant heartburn, bowel changes, GU symptoms (dysuria, hematuria, incontinence), Gyn symptoms (abnormal  bleeding, pain),  syncope, focal weakness, memory loss, numbness & tingling, skin/hair/nail changes, abnormal bruising or bleeding.  + anxiety/depression- pt has new cancer dx and is really struggling w/ this on top of already labile mood     Objective:   Physical  Exam General Appearance:    Alert, cooperative, no distress, appears stated age, obese  Head:    Normocephalic, without obvious abnormality, atraumatic  Eyes:    PERRL, conjunctiva/corneas clear, EOM's intact, fundi    benign, both eyes  Ears:    Normal TM's and external ear canals, both ears  Nose:   Nares normal, septum midline, mucosa normal, no drainage    or sinus tenderness  Throat:   Lips, mucosa, and tongue normal; teeth and gums normal  Neck:   Supple, symmetrical, trachea midline, no adenopathy;    Thyroid: no enlargement/tenderness/nodules  Back:     Symmetric, no curvature, ROM normal, no CVA tenderness  Lungs:     Clear to auscultation bilaterally, respirations unlabored  Chest Wall:    No tenderness or deformity   Heart:    Regular rate and rhythm, S1 and S2 normal, no murmur, rub   or gallop  Breast Exam:    Deferred to mammo  Abdomen:     Soft, non-tender, bowel sounds active all four quadrants,    no masses, no organomegaly  Genitalia:    Deferred to GYN  Rectal:    Extremities:   Extremities normal, atraumatic, no cyanosis or edema  Pulses:   2+ and symmetric all extremities  Skin:   Skin color, texture, turgor normal, no rashes or lesions  Lymph nodes:   Cervical, supraclavicular, and axillary nodes normal  Neurologic:   CNII-XII intact, normal strength, sensation and reflexes    throughout          Assessment & Plan:

## 2015-07-25 NOTE — Progress Notes (Signed)
Pre visit review using our clinic review tool, if applicable. No additional management support is needed unless otherwise documented below in the visit note. 

## 2015-07-25 NOTE — Patient Instructions (Signed)
Follow up in 3-4 months (sooner if needed!) to recheck diabetes We'll notify you of your lab results and make any changes if needed Please allow those around you to help you!  It is out of love! You are up to date on colonoscopy, foot exam, eye exam, and mammogram- fantastic! Please let me know if you need anything!!! Hang in there!!!

## 2015-07-26 ENCOUNTER — Telehealth: Payer: Self-pay | Admitting: *Deleted

## 2015-07-26 LAB — HEMOGLOBIN A1C
HEMOGLOBIN A1C: 6.1 % — AB (ref <5.7)
Mean Plasma Glucose: 128 mg/dL

## 2015-07-26 LAB — LIPID PANEL
CHOL/HDL RATIO: 2.4 ratio (ref <=5.0)
Cholesterol: 114 mg/dL — ABNORMAL LOW (ref 125–200)
HDL: 48 mg/dL (ref >=46)
LDL CALC: 45 mg/dL (ref <130)
TRIGLYCERIDES: 107 mg/dL (ref <150)
VLDL: 21 mg/dL (ref <30)

## 2015-07-26 NOTE — Telephone Encounter (Addendum)
Received call from patient stating she was very anxious about getting started with treatment.  "It's a lot of hurry up and wait."  She feels out of control and frustrated.  Offered and alight guide so she could talk with someone who is going through the same situation.  She declined.  She doesn't like talking to people about this. SHe states her daughter came down and she was abrupt with her as well.  Offered to call Dr. Darrel Hoover office to see if we can move up her appointment but she states her daughter is coming in for that appointment and didn't want to change this.  She states she basically just wanted to vent.  Encouraged her to call with any needs or concerns.

## 2015-07-26 NOTE — Assessment & Plan Note (Signed)
Chronic problem.  Currently diet controlled.  UTD on foot exam, eye exam.  On ARB for renal protection.  Check labs.  Start meds prn.

## 2015-07-26 NOTE — Assessment & Plan Note (Signed)
Deteriorated.  Pt is really struggling w/ new dx of breast cancer and how she is going to feel about herself after she has her desired double mastectomy.  Encouraged her to seek counseling and let those around her offer their love and support- pt has hx of shutting people out.  Will continue to follow.

## 2015-07-26 NOTE — Assessment & Plan Note (Signed)
Chronic problem.  Tolerating statin w/o difficulty.  Recent LFTs WNL.  Check lipid panel.  Adjust meds prn.

## 2015-07-26 NOTE — Assessment & Plan Note (Signed)
Chronic problem.  Currently asymptomatic.  Check labs.  Adjust meds prn  

## 2015-07-26 NOTE — Assessment & Plan Note (Signed)
Pt's PE unchanged from previous.  UTD on mammo, colonoscopy, immunizations, foot exam, eye exam.  Written screening schedule updated and given to pt.  Check labs that have not recently been done by oncology.  Anticipatory guidance provided.

## 2015-07-26 NOTE — Assessment & Plan Note (Signed)
Chronic problem.  Adequate control.  Asymptomatic.  Recent BMP WNL- no med changes at this time.

## 2015-07-27 ENCOUNTER — Telehealth: Payer: Self-pay | Admitting: Genetic Counselor

## 2015-07-27 NOTE — Telephone Encounter (Signed)
Discussed with Tracey Evans that her genetic test result was negative for any known pathogenic mutations within any of 20 genes on the Breast/Ovarian Cancer Panel.  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  Discussed that we treat this just like a negative result until it gets updated by the lab and reviewed why we do that.  Also, at least one other lab is calling this a 'benign' change, so it is most likely that this will get updated to a benign change in the future.  Encouraged Tracey Evans to keep her phone number up to date with [place], so we can call and let her know when this gets updated.  Discussed that she should continue to follow her doctors' recommendations for future cancer screening.  Tracey Evans reports to me that she is really wanting double mastectomies at this point and has made her doctors aware.  Discussed that women in the family should be sure to have annual mammogram screening, clinical breast exams, and self breast exams.  Discussed that some other affected relatives may be eligible for genetic testing (especially thinking about nieces who were diagnosed at younger ages).  Tracey Evans is welcome to call with any questions.  I will let her doctors aware of this result.

## 2015-07-28 ENCOUNTER — Telehealth: Payer: Self-pay | Admitting: *Deleted

## 2015-07-28 ENCOUNTER — Ambulatory Visit: Payer: Self-pay | Admitting: Genetic Counselor

## 2015-07-28 DIAGNOSIS — C50511 Malignant neoplasm of lower-outer quadrant of right female breast: Secondary | ICD-10-CM

## 2015-07-28 DIAGNOSIS — Z1379 Encounter for other screening for genetic and chromosomal anomalies: Secondary | ICD-10-CM

## 2015-07-28 DIAGNOSIS — Z800 Family history of malignant neoplasm of digestive organs: Secondary | ICD-10-CM

## 2015-07-28 DIAGNOSIS — Z803 Family history of malignant neoplasm of breast: Secondary | ICD-10-CM

## 2015-07-28 NOTE — Telephone Encounter (Signed)
Received call from patient stating she now wants to move up her appointment with Dr. Dalbert Batman.  Genetic testing came back negative.  Informed [facility] of this but unfortunately Dr. Dalbert Batman does not have availability.  They told her they would call if they got a cancellation, but her daughter is coming from [place] and she will keep the appointment the same.  Encouraged her to call with any needs or concerns.

## 2015-07-29 ENCOUNTER — Encounter: Payer: Self-pay | Admitting: Hematology and Oncology

## 2015-07-29 NOTE — Progress Notes (Signed)
recd from pod- left for dr. Lindi Adie to sign

## 2015-07-29 NOTE — Progress Notes (Signed)
I emailed kim shoffner fmal forms for her mom

## 2015-08-01 ENCOUNTER — Other Ambulatory Visit: Payer: Self-pay | Admitting: General Surgery

## 2015-08-01 DIAGNOSIS — I10 Essential (primary) hypertension: Secondary | ICD-10-CM | POA: Diagnosis not present

## 2015-08-01 DIAGNOSIS — E785 Hyperlipidemia, unspecified: Secondary | ICD-10-CM | POA: Diagnosis not present

## 2015-08-01 DIAGNOSIS — K219 Gastro-esophageal reflux disease without esophagitis: Secondary | ICD-10-CM | POA: Diagnosis not present

## 2015-08-01 DIAGNOSIS — C50111 Malignant neoplasm of central portion of right female breast: Secondary | ICD-10-CM

## 2015-08-01 DIAGNOSIS — Z803 Family history of malignant neoplasm of breast: Secondary | ICD-10-CM | POA: Diagnosis not present

## 2015-08-01 DIAGNOSIS — Z90710 Acquired absence of both cervix and uterus: Secondary | ICD-10-CM | POA: Diagnosis not present

## 2015-08-01 DIAGNOSIS — M797 Fibromyalgia: Secondary | ICD-10-CM | POA: Diagnosis not present

## 2015-08-03 ENCOUNTER — Telehealth: Payer: Self-pay | Admitting: *Deleted

## 2015-08-03 NOTE — Telephone Encounter (Signed)
Discussed items to bring for hospital stay. Denies further needs at this time.

## 2015-08-04 ENCOUNTER — Encounter: Payer: Self-pay | Admitting: *Deleted

## 2015-08-04 NOTE — Progress Notes (Signed)
[place] Work  Clinical Social Work was referred by patient for assistance with ADR completion. Pt plans to complete with CSW on 08/10/15. Pt reports to be doing well.    Loren Racer, [place] Worker [place]  [place] Phone: [phone] Fax: [phone]

## 2015-08-08 ENCOUNTER — Other Ambulatory Visit: Payer: Self-pay | Admitting: Hematology and Oncology

## 2015-08-08 ENCOUNTER — Telehealth: Payer: Self-pay | Admitting: Hematology and Oncology

## 2015-08-08 ENCOUNTER — Other Ambulatory Visit: Payer: Self-pay | Admitting: *Deleted

## 2015-08-08 NOTE — Telephone Encounter (Signed)
s.w. pt and advised on may appt.....pt ok and aware °

## 2015-08-09 ENCOUNTER — Encounter: Payer: Self-pay | Admitting: Hematology and Oncology

## 2015-08-09 NOTE — Pre-Procedure Instructions (Addendum)
Tracey Evans  08/09/2015      [id number] [address], [place] [address] [place] [place] [address] Phone: [phone] Fax: [phone]  [place], [address] [place] [address] Phone: [phone] Fax: [phone]    Your procedure is scheduled on May 8.  Report to [facility] Admitting at [facility] A[facility].  Call this number if you have problems the morning of surgery:  [phone]   Remember:  Do not eat food or drink liquids after midnight.  Take these medicines the morning of surgery with A SIP OF WATER albuterol inhaler if needed, alprazolam (Xanax) if needed, anastrozole (Arimidex), Bupropion (Wellbutrin xl), cyclobenzaprine (Flexeril) if needed,  Levothyroxine (Synthroid), Meclizine (Antivert) if needed, Nitrostat if needed, Ondansetron (Zofran) or Promethazine (Phenergan) if needed, ranitidine (Zantac), Tramadol (Ultram) if needed  Stop taking aspirin, BC's, goody's, Herbal medications, Fish Oil, Ibuprofen, Advil, Motrin, Aleve Bring your inhalers with you on the day of surgery.   How to Manage Your Diabetes Before and After Surgery  Why is it important to control my blood sugar before and after surgery? . Improving blood sugar levels before and after surgery helps healing and can limit problems. . A way of improving blood sugar control is eating a healthy diet by: o  Eating less sugar and carbohydrates o  Increasing activity/exercise o  Talking with your doctor about reaching your blood sugar goals . High blood sugars (greater than 180 mg/dL) can raise your risk of infections and slow your recovery, so you will need to focus on controlling your diabetes during the weeks before surgery. . Make sure that the doctor who takes care of your diabetes knows about your planned surgery including the date and location.  How do I manage my blood sugar before  surgery? . Check your blood sugar at least 4 times a day, starting 2 days before surgery, to make sure that the level is not too high or low. o Check your blood sugar the morning of your surgery when you wake up and every 2 hours until you get to the Short Stay unit. . If your blood sugar is less than 70 mg/dL, you will need to treat for low blood sugar: o Do not take insulin. o Treat a low blood sugar (less than 70 mg/dL) with  cup of clear juice (cranberry or apple), 4 glucose tablets, OR glucose gel. o Recheck blood sugar in 15 minutes after treatment (to make sure it is greater than 70 mg/dL). If your blood sugar is not greater than 70 mg/dL on recheck, call [phone] for further instructions. . Report your blood sugar to the short stay nurse when you get to Short Stay.  . If you are admitted to the hospital after surgery: o Your blood sugar will be checked by the staff and you will probably be given insulin after surgery (instead of oral diabetes medicines) to make sure you have good blood sugar levels. o The goal for blood sugar control after surgery is 80-180 mg/dL.     WHAT DO I DO ABOUT MY DIABETES MEDICATION? _  . Do not take oral diabetes medicines (pills) the morning of surgery.     . If your CBG is greater than 220 mg/dL, you may take  of your sliding scale (correction) dose of insulin.  Other Instructions:          Patient Signature:  Date:   Nurse Signature:  Date:   Reviewed and Endorsed by [facility] Patient Education Committee, August 2015  Do not wear jewelry, make-up or nail polish.  Do not wear lotions, powders, or perfumes.  You may wear deodorant.  Do not shave 48 hours prior to surgery.  Men may shave face and neck.  Do not bring valuables to the hospital.  [facility] is not responsible for any belongings or valuables.  Contacts, dentures or bridgework may not be worn into surgery.  Leave your suitcase in the car.  After surgery it may be  brought to your room.  For patients admitted to the hospital, discharge time will be determined by your treatment team.  Patients discharged the day of surgery will not be allowed to drive home.    Special instructions:  [facility] - Preparing for Surgery  Before surgery, you can play an important role.  Because skin is not sterile, your skin needs to be as free of germs as possible.  You can reduce the number of germs on you skin by washing with CHG (chlorahexidine gluconate) soap before surgery.  CHG is an antiseptic cleaner which kills germs and bonds with the skin to continue killing germs even after washing.  Please DO NOT use if you have an allergy to CHG or antibacterial soaps.  If your skin becomes reddened/irritated stop using the CHG and inform your nurse when you arrive at Short Stay.  Do not shave (including legs and underarms) for at least 48 hours prior to the first CHG shower.  You may shave your face.  Please follow these instructions carefully:   1.  Shower with CHG Soap the night before surgery and the   morning of Surgery.  2.  If you choose to wash your hair, wash your hair first as usual with your  normal shampoo.  3.  After you shampoo, rinse your hair and body thoroughly to remove the  Shampoo.  4.  Use CHG as you would any other liquid soap.  You can apply chg directly       to the skin and wash gently with scrungie or a clean washcloth.  5.  Apply the CHG Soap to your body ONLY FROM THE NECK DOWN.   Do not use on open wounds or open sores.  Avoid contact with your eyes,  ears, mouth and genitals (private parts).  Wash genitals (private parts)  with your normal soap.  6.  Wash thoroughly, paying special attention to the area where your surgery  will be performed.  7.  Thoroughly rinse your body with warm water from the neck down.  8.  DO NOT shower/wash with your normal soap after using and rinsing off  the CHG Soap.  9.  Pat yourself dry with a clean towel.             10.  Wear clean pajamas.            11.  Place clean sheets on your bed the night of your first shower and do not        sleep with pets.  Day of Surgery  Do not apply any lotions/deoderants the morning of surgery.  Please wear clean clothes to the hospital/surgery center.     Please read over the following fact sheets that you were given. Pain Booklet, Coughing and Deep Breathing and Surgical Site Infection Prevention

## 2015-08-09 NOTE — Progress Notes (Signed)
Revised form and sent to daughter via email- added date of service and approx end date--no other can be filled out until treatment has bee establised. See notes

## 2015-08-10 ENCOUNTER — Encounter (HOSPITAL_COMMUNITY)
Admission: RE | Admit: 2015-08-10 | Discharge: 2015-08-10 | Disposition: A | Discharge status: Home / Self Care | Payer: Medicare Other | Source: Ambulatory Visit | Attending: General Surgery | Admitting: General Surgery

## 2015-08-10 ENCOUNTER — Ambulatory Visit (HOSPITAL_COMMUNITY)
Admission: RE | Admit: 2015-08-10 | Discharge: 2015-08-10 | Disposition: A | Discharge status: Home / Self Care | Payer: Medicare Other | Source: Ambulatory Visit | Attending: General Surgery | Admitting: General Surgery

## 2015-08-10 ENCOUNTER — Encounter (HOSPITAL_COMMUNITY): Payer: Self-pay

## 2015-08-10 ENCOUNTER — Other Ambulatory Visit: Payer: Self-pay

## 2015-08-10 DIAGNOSIS — Z01818 Encounter for other preprocedural examination: Secondary | ICD-10-CM | POA: Insufficient documentation

## 2015-08-10 DIAGNOSIS — Z452 Encounter for adjustment and management of vascular access device: Secondary | ICD-10-CM | POA: Diagnosis not present

## 2015-08-10 DIAGNOSIS — Z7982 Long term (current) use of aspirin: Secondary | ICD-10-CM | POA: Diagnosis not present

## 2015-08-10 DIAGNOSIS — Z01810 Encounter for preprocedural cardiovascular examination: Secondary | ICD-10-CM | POA: Insufficient documentation

## 2015-08-10 DIAGNOSIS — Z79899 Other long term (current) drug therapy: Secondary | ICD-10-CM | POA: Diagnosis not present

## 2015-08-10 HISTORY — DX: Reserved for inherently not codable concepts without codable children: IMO0001

## 2015-08-10 LAB — CBC WITH DIFFERENTIAL/PLATELET
BASOS PCT: 1 %
Basophils Absolute: 0.1 10*3/uL (ref 0.0–0.1)
EOS ABS: 0.4 10*3/uL (ref 0.0–0.7)
Eosinophils Relative: 5 %
HEMATOCRIT: 40.4 % (ref 36.0–46.0)
Hemoglobin: 13.4 g/dL (ref 12.0–15.0)
Lymphocytes Relative: 37 %
Lymphs Abs: 3.2 10*3/uL (ref 0.7–4.0)
MCH: 30 pg (ref 26.0–34.0)
MCHC: 33.2 g/dL (ref 30.0–36.0)
MCV: 90.6 fL (ref 78.0–100.0)
MONO ABS: 0.7 10*3/uL (ref 0.1–1.0)
MONOS PCT: 8 %
NEUTROS ABS: 4.4 10*3/uL (ref 1.7–7.7)
Neutrophils Relative %: 49 %
Platelets: 317 10*3/uL (ref 150–400)
RBC: 4.46 MIL/uL (ref 3.87–5.11)
RDW: 13 % (ref 11.5–15.5)
WBC: 8.7 10*3/uL (ref 4.0–10.5)

## 2015-08-10 LAB — COMPREHENSIVE METABOLIC PANEL
ALBUMIN: 4.1 g/dL (ref 3.5–5.0)
ALK PHOS: 86 U/L (ref 38–126)
ALT: 24 U/L (ref 14–54)
ANION GAP: 9 (ref 5–15)
AST: 25 U/L (ref 15–41)
BILIRUBIN TOTAL: 0.8 mg/dL (ref 0.3–1.2)
BUN: 8 mg/dL (ref 6–20)
CALCIUM: 9.6 mg/dL (ref 8.9–10.3)
CO2: 27 mmol/L (ref 22–32)
Chloride: 107 mmol/L (ref 101–111)
Creatinine, Ser: 0.91 mg/dL (ref 0.44–1.00)
GFR calc non Af Amer: >60 mL/min (ref >60)
Glucose, Bld: 119 mg/dL — ABNORMAL HIGH (ref 65–99)
POTASSIUM: 3.1 mmol/L — AB (ref 3.5–5.1)
SODIUM: 143 mmol/L (ref 135–145)
TOTAL PROTEIN: 6.7 g/dL (ref 6.5–8.1)

## 2015-08-10 LAB — GLUCOSE, CAPILLARY: Glucose-Capillary: 125 mg/dL — ABNORMAL HIGH (ref 65–99)

## 2015-08-10 NOTE — Progress Notes (Addendum)
PCP is Dr. Annye Asa Cardiologist is Dr Sunday Corn States she had card cath many years ago, doesn't remember when. States she had a stress test 4-5 months noted under imaging tab. Scheduled for an echo May 18th at 0900. Reports that she does not check her cbg's Reports that in 2009 or 2011 she had a bronchospasm, but has not had a problem since, as long as she uses her inhaler or neb before she will be fine.- Encouraged her to use her inhaler before surgery- voices understanding.

## 2015-08-10 NOTE — Progress Notes (Signed)
Anesthesia Chart Review:  Pt is a 70 year old female scheduled for R total mastectomy with sentinel lymph node biopsy and blue dye injection, insertion of port-a-cath on 08/15/2015 with Dr. Dalbert Batman.   PMH includes:  HTN, DM, hyperlipidemia, asthma, breast cancer, GERD. Never smoker. BMI 33.  Anesthesia history includes bronchospasm and desat perioperatively 11/09/09. Treated with albuterol, sx improved. For surgery 11/12/09, pt was given albuterol neb in holding area prior to surgery, and then received prophylactic albuterol 2 hours into surgery. No bronchospasm perioperatively 11/12/09. Both anesthesia records on paper chart for review.  Pt instructed to use albuterol DOS by PAT RN.   Medications include: albuterol, amlodipine-valsartan, arimidex, ASA, lipitor, levothyroxine, zantac  Preoperative labs reviewed.    Chest x-ray 08/10/15 reviewed. No active cardiopulmonary disease  EKG 08/10/15: NSR  If no changes, I anticipate pt can proceed with surgery as scheduled.   Willeen Cass, FNP-BC [facility] Short Stay Surgical Center/Anesthesiology Phone: [phone] 08/10/2015 4:42 PM

## 2015-08-11 ENCOUNTER — Encounter: Payer: Self-pay | Admitting: *Deleted

## 2015-08-11 NOTE — Progress Notes (Signed)
[place] Work  Clinical Social Work phoned pt to check in as pt did not appear for appointment to complete ADRs. Pt would like to reschedule and plans to come at 3pm on Friday, May 5. Pt aware to please call to cancel if she cannot attend appointment. CSW to complete ADRs with pt tomorrow.    Loren Racer, [place] Worker [place]  [place] Phone: [phone] Fax: [phone]

## 2015-08-12 ENCOUNTER — Encounter: Payer: Self-pay | Admitting: *Deleted

## 2015-08-12 NOTE — Progress Notes (Signed)
[place] Social Work  Clinical Social Work was referred by pt  to review and complete healthcare advance directives.  Clinical Social Worker met with patient in [place] office.  Patient completed healthcare living will.  She stated she had already HCPOA forms completed per her report.   Clinical Social Worker notarized documents and made copies for patient/family. Clinical Social Worker will send documents to medical records to be scanned into patient's chart. Clinical Social Worker encouraged patient/family to contact with any additional questions or concerns.  Loren Racer, [place] Worker [place]  [place] Phone: [phone] Fax: [phone]

## 2015-08-12 NOTE — H&P (Signed)
Tracey Evans  Location: [facility] Patient #: [id number] DOB: July 26, 1945 Single / Language: Tracey Evans / Race: Black or African American Female       History of Present Illness  The patient is a 70 year old female who presents with breast cancer. This is a 70 year old woman who returns for her second preoperative conversation about measure of her multifocal right breast cancer. Her daughter is participating in the conversation via face time. Dr. Terrence Dupont is her cardiologist, Dr. Birdie Riddle is her PCP. She was initially evaluated in the [facility] on July 13, 2015 by Dr. Lindi Adie, Dr. Lisbeth Renshaw, and me.  Past breast history reveals biopsy at age 67 for benign disease. Recent screening mammogram showed 2 cancers in the right breast. First, in the far posterior inferior breast at 6:00 is a 1.8 cm mass near the inframammary fold which was biopsied and showed a grade 2-3 invasive carcinoma. This is a triple positive tumor. Second cancer is in the right breast a more anterior and more medial, at least 6 cm away and this was biopsied and showed high-grade DCIS, receptor positive. Ultrasound of the right axilla is negative.  Subsequent MRI of the breasts show a 2.3 cm mass in the right breast at the 6 o'clock position consistent with the invasive cancer. There is also an area of non-mass-like enhancement in the central medial breast consistent with the ductal carcinoma in situ. No abnormal lymph nodes anywhere. Left breast looks normal.  The patient is currently taking Arimidex. She seen Dr. Terrence Dupont and cardiac clearance has been completed. He has cleared her for surgery. She's had genetic testing which is negative. She does have a strong family history of breast cancer in 2 sisters and 2 nieces. One niece died of metastatic disease.  She takes albuterol inhaler. For anxiety she takes alprazolam, Wellbutrin, and Klonopin. For her fibromyalgia she takes Voltaren, Flexeril, meloxicam  and tramadol. for hypertension and she takes amlodipine and valsartan. She is taking anastrozole for her breast cancer. She takes aspirin 81 mg a day and I advised her to stop that about 3 days preoperatively. She takes an Lipitor for hypercholesterolemia state Zantac for reflux symptoms. She takes nitroglycerin for stable angina. She takes Synthroid for hypothyroidism.  We had a one-hour conversation today about treatment. She knows that Dr. Lindi Adie plans chemotherapy and wants a Port-A-Cath insertion and she insert understands that. We spent a long time talking about unilateral versus bilateral mastectomy. Double lumpectomy versus mastectomy. Radiation therapy and the indications for that. She has decided she does not want prophylactic left mastectomy, and I support that decision since there is no survival benefit that we know of. We talked about double lumpectomy and the deformity that would occur with that which would be fairly significant and will either require plastic surgical revisions or a prosthesis. I told her there was no good data on recurrence rates for double lumpectomy.  She is single. Has 3 children. Denies tobacco. Alcohol rarely. Anxious to become fully functional after surgery and went over that temporary disabilities.  We had a one-hour conversation. After lengthy discussion, she is comfortable with proceeding with right total mastectomy, sentinel lymph node biopsy and Port-A-Cath insertion. We discussed the indications, details, techniques, and numerous risk of the surgery. She is aware of the risk of bleeding, infection, reoperation for multiple positive nodes, shoulder disability, skin necrosis, prolonged drainage, pneumothorax, port revision and other unforeseen problems. She understands all these issues well. All of her questions were answered. She agrees  with this plan.   Addendum Note Genetic testing is negative for any known pathogenic  mutations.   Allergies  Codeine Sulfate *ANALGESICS - OPIOID*  Medication History  ALPRAZolam (1MG  Tablet, Oral) Active. No Current Medications (Taken starting 08/01/2015) TraMADol HCl (50MG  Tablet, Oral) Active. Amlodipine Besylate-Valsartan (5-160MG  Tablet, Oral) Active. Anastrozole (1MG  Tablet, Oral) Active. Atorvastatin Calcium (20MG  Tablet, Oral) Active. BuPROPion HCl ER (XL) (300MG  Tablet ER 24HR, Oral) Active. Levothyroxine Sodium (75MCG Tablet, Oral) Active. Meloxicam (15MG  Tablet, Oral) Active. TiZANidine HCl (4MG  Tablet, Oral) Active. Albuterol Sulfate HFA (108 (90 Base)MCG/ACT Aerosol Soln, Inhalation) Active. Aspirin (81MG  Tablet Chewable, Oral) Active. ClonazePAM (0.5MG  Tablet Disperse, Oral) Active. Diclofenac Sodium (1% Gel, External) Active. Flexeril (5MG  Tablet, Oral) Active. Meclizine HCl (25MG  Tablet, Oral) Active. Nitrostat (0.4MG  Tab Sublingual, Sublingual) Active. Ondansetron (8MG  Tablet Disperse, Oral) Active. Systane (0.4-0.3% Gel, Ophthalmic) Active. Phenergan (12.5MG  Tablet, Oral) Active. RaNITidine HCl (150MG  Tablet, Oral) Active. Medications Reconciled  Vitals  Weight: 207 lb Height: 65in Body Surface Area: 2.01 m Body Mass Index: 34.45 kg/m  Temp.: 97.22F  Pulse: 86 (Regular)  BP: 128/76 (Sitting, Left Arm, Standard)    Physical Exam  General Mental Status-Alert. General Appearance-Not in acute distress. Build & Nutrition-Well nourished. Posture-Normal posture. Gait-Normal.  Head and Neck Head-normocephalic, atraumatic with no lesions or palpable masses. Trachea-midline. Thyroid Gland Characteristics - normal size and consistency and no palpable nodules.  Chest and Lung Exam Chest and lung exam reveals -on auscultation, normal breath sounds, no adventitious sounds and normal vocal resonance.  Breast Note: Breasts are medium to large in size. There is a small palpable mass at the  inframammary crease of the right breast at the 6 o'clock position. This seems to move around but does seem to involve the skin of the breast. Some of this may be resolving hematoma but I think it's the cancer. I cannot feel the area of DCIS in the medial posterior area. No other masses in either breast. No other skin changes. No axillary adenopathy on either side.   Cardiovascular Cardiovascular examination reveals -normal heart sounds, regular rate and rhythm with no murmurs and femoral artery auscultation bilaterally reveals normal pulses, no bruits, no thrills.  Abdomen Inspection Inspection of the abdomen reveals - No Hernias. Palpation/Percussion Palpation and Percussion of the abdomen reveal - Soft, Non Tender, No Rigidity (guarding), No hepatosplenomegaly and No Palpable abdominal masses.  Neurologic Neurologic evaluation reveals -alert and oriented x 3 with no impairment of recent or remote memory, normal attention span and ability to concentrate, normal sensation and normal coordination.  Musculoskeletal Normal Exam - Bilateral-Upper Extremity Strength Normal and Lower Extremity Strength Normal.    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 There is a 2.3 cm invasive cancer in the right breast at the 6 o'clock position near the muscle but does not appear to be invading the muscle. There is also the area of enhancement in the central medial right breast which is the ductal carcinoma in situ. These 2 areas are 6 cm apart. To remove both of them would require a larger area of resection.  your genetic testing is negative. You have a very strong history of breast cancer in 2 sisters and 2 nieces. Whether or not you actually have a mutation is unknown   We have talked about the difference between double lumpectomy and total mastectomy. We have talked about sentinel lymph node biopsy. We talked about  Port-A-Cath insertion. We talked about immediate and delayed plastic surgical reconstruction. We have talked about what we do and don't know about recurrence rates following mastectomy, and lumpectomy. We do not have good data on recurrence rates for multifocal cancer that is treated with multiple lumpectomies. I also think that double lumpectomy would leave a significant defect that would require either prosthesis or plastic surgical revision. You have decided against a prophylactic left mastectomy. I agree with that decision since there is no survival benefit to that.  We have had a lengthy conversation with you and your daughter. You have decided to proceed with right total mastectomy with right axillary sentinel node biopsy and Port-A-Cath insertion. you stated that you was are not interested in plastic surgical reconstruction, and that you will wear a bra with an external prosthesis all of these decisions are very reasonable.  Stop aspirin 3 days preop  FAMILY HISTORY OF BREAST CANCER IN FIRST DEGREE RELATIVE (Z80.3) HISTORY OF ABDOMINAL HYSTERECTOMY (Z90.710) HYPERLIPIDEMIA, ACQUIRED (E78.5) CHRONIC GERD (K21.9) HYPERTENSION, BENIGN (I10) FIBROMYALGIA (M79.7)   Edsel Petrin. Dalbert Batman, M.D., [facility] General and Minimally invasive Surgery Breast and Colorectal Surgery Office:   [phone] Pager:   [phone]

## 2015-08-15 ENCOUNTER — Encounter (HOSPITAL_COMMUNITY): Payer: Self-pay | Admitting: Surgery

## 2015-08-15 ENCOUNTER — Observation Stay (HOSPITAL_COMMUNITY): Payer: Medicare Other

## 2015-08-15 ENCOUNTER — Ambulatory Visit (HOSPITAL_COMMUNITY): Payer: Medicare Other

## 2015-08-15 ENCOUNTER — Encounter (HOSPITAL_COMMUNITY)
Admission: RE | Admit: 2015-08-15 | Discharge: 2015-08-15 | Disposition: A | Discharge status: Home / Self Care | Payer: Medicare Other | Source: Ambulatory Visit | Attending: General Surgery | Admitting: General Surgery

## 2015-08-15 ENCOUNTER — Ambulatory Visit (HOSPITAL_COMMUNITY): Payer: Medicare Other | Admitting: Anesthesiology

## 2015-08-15 ENCOUNTER — Encounter (HOSPITAL_COMMUNITY)
Admission: RE | Disposition: A | Discharge status: Home / Self Care | Payer: Self-pay | Source: Ambulatory Visit | Attending: General Surgery

## 2015-08-15 ENCOUNTER — Ambulatory Visit (HOSPITAL_COMMUNITY): Payer: Medicare Other | Admitting: Vascular Surgery

## 2015-08-15 ENCOUNTER — Inpatient Hospital Stay (HOSPITAL_COMMUNITY)
Admission: RE | Admit: 2015-08-15 | Discharge: 2015-08-16 | DRG: 581 | Disposition: A | Discharge status: Home / Self Care | Payer: Medicare Other | Source: Ambulatory Visit | Attending: General Surgery | Admitting: General Surgery

## 2015-08-15 DIAGNOSIS — I208 Other forms of angina pectoris: Secondary | ICD-10-CM | POA: Diagnosis present

## 2015-08-15 DIAGNOSIS — M797 Fibromyalgia: Secondary | ICD-10-CM | POA: Diagnosis present

## 2015-08-15 DIAGNOSIS — Z90710 Acquired absence of both cervix and uterus: Secondary | ICD-10-CM | POA: Diagnosis not present

## 2015-08-15 DIAGNOSIS — C50111 Malignant neoplasm of central portion of right female breast: Secondary | ICD-10-CM | POA: Diagnosis not present

## 2015-08-15 DIAGNOSIS — Z886 Allergy status to analgesic agent status: Secondary | ICD-10-CM

## 2015-08-15 DIAGNOSIS — F419 Anxiety disorder, unspecified: Secondary | ICD-10-CM | POA: Diagnosis present

## 2015-08-15 DIAGNOSIS — K219 Gastro-esophageal reflux disease without esophagitis: Secondary | ICD-10-CM | POA: Diagnosis present

## 2015-08-15 DIAGNOSIS — D0511 Intraductal carcinoma in situ of right breast: Secondary | ICD-10-CM | POA: Diagnosis present

## 2015-08-15 DIAGNOSIS — E039 Hypothyroidism, unspecified: Secondary | ICD-10-CM | POA: Diagnosis present

## 2015-08-15 DIAGNOSIS — C50911 Malignant neoplasm of unspecified site of right female breast: Secondary | ICD-10-CM | POA: Diagnosis not present

## 2015-08-15 DIAGNOSIS — Z170 Estrogen receptor positive status [ER+]: Secondary | ICD-10-CM

## 2015-08-15 DIAGNOSIS — Z885 Allergy status to narcotic agent status: Secondary | ICD-10-CM | POA: Diagnosis not present

## 2015-08-15 DIAGNOSIS — Z7982 Long term (current) use of aspirin: Secondary | ICD-10-CM

## 2015-08-15 DIAGNOSIS — Z882 Allergy status to sulfonamides status: Secondary | ICD-10-CM

## 2015-08-15 DIAGNOSIS — Z79811 Long term (current) use of aromatase inhibitors: Secondary | ICD-10-CM | POA: Diagnosis not present

## 2015-08-15 DIAGNOSIS — Z803 Family history of malignant neoplasm of breast: Secondary | ICD-10-CM

## 2015-08-15 DIAGNOSIS — E7800 Pure hypercholesterolemia, unspecified: Secondary | ICD-10-CM | POA: Diagnosis present

## 2015-08-15 DIAGNOSIS — G8918 Other acute postprocedural pain: Secondary | ICD-10-CM | POA: Diagnosis not present

## 2015-08-15 DIAGNOSIS — Z452 Encounter for adjustment and management of vascular access device: Secondary | ICD-10-CM | POA: Diagnosis not present

## 2015-08-15 DIAGNOSIS — E785 Hyperlipidemia, unspecified: Secondary | ICD-10-CM | POA: Diagnosis present

## 2015-08-15 DIAGNOSIS — Z95828 Presence of other vascular implants and grafts: Secondary | ICD-10-CM

## 2015-08-15 DIAGNOSIS — C50511 Malignant neoplasm of lower-outer quadrant of right female breast: Secondary | ICD-10-CM | POA: Diagnosis present

## 2015-08-15 DIAGNOSIS — I10 Essential (primary) hypertension: Secondary | ICD-10-CM | POA: Diagnosis present

## 2015-08-15 HISTORY — DX: Low back pain: M54.5

## 2015-08-15 HISTORY — DX: Other chronic pain: G89.29

## 2015-08-15 HISTORY — DX: Malignant neoplasm of unspecified site of right female breast: C50.911

## 2015-08-15 HISTORY — DX: Unspecified chronic bronchitis: J42

## 2015-08-15 HISTORY — DX: Low back pain, unspecified: M54.50

## 2015-08-15 HISTORY — PX: MASTECTOMY COMPLETE / SIMPLE W/ SENTINEL NODE BIOPSY: SUR846

## 2015-08-15 HISTORY — PX: PORTACATH PLACEMENT: SHX2246

## 2015-08-15 HISTORY — PX: MASTECTOMY W/ SENTINEL NODE BIOPSY: SHX2001

## 2015-08-15 HISTORY — DX: Type 2 diabetes mellitus without complications: E11.9

## 2015-08-15 LAB — GLUCOSE, CAPILLARY
GLUCOSE-CAPILLARY: 98 mg/dL (ref 65–99)
Glucose-Capillary: 96 mg/dL (ref 65–99)

## 2015-08-15 SURGERY — MASTECTOMY WITH SENTINEL LYMPH NODE BIOPSY
Anesthesia: General | Site: Chest | Laterality: Right

## 2015-08-15 MED ORDER — ROCURONIUM BROMIDE 100 MG/10ML IV SOLN
INTRAVENOUS | Status: DC | PRN
  Administered 2015-08-15: 40 mg via INTRAVENOUS
  Administered 2015-08-15: 10 mg via INTRAVENOUS

## 2015-08-15 MED ORDER — AMLODIPINE BESYLATE-VALSARTAN 5-160 MG PO TABS: 1.0000 | ORAL_TABLET | Freq: Every day | ORAL | Status: DC

## 2015-08-15 MED ORDER — CLONAZEPAM 0.5 MG PO TABS: 0.5000 mg | ORAL_TABLET | Freq: Every day | ORAL | Status: DC

## 2015-08-15 MED ORDER — SENNOSIDES-DOCUSATE SODIUM 8.6-50 MG PO TABS: 1.0000 | ORAL_TABLET | Freq: Every evening | ORAL | Status: DC | PRN

## 2015-08-15 MED ORDER — CHLORHEXIDINE GLUCONATE 4 % EX LIQD: 1.0000 "application " | Freq: Once | CUTANEOUS | Status: DC

## 2015-08-15 MED ORDER — ATORVASTATIN CALCIUM 20 MG PO TABS
20.0000 mg | ORAL_TABLET | Freq: Every day | ORAL | Status: DC
  Administered 2015-08-15: 20 mg via ORAL
  Filled 2015-08-15: qty 1

## 2015-08-15 MED ORDER — LACTATED RINGERS IV SOLN
INTRAVENOUS | Status: DC | PRN
  Administered 2015-08-15 (×2): via INTRAVENOUS

## 2015-08-15 MED ORDER — BUPROPION HCL ER (XL) 150 MG PO TB24
300.0000 mg | ORAL_TABLET | Freq: Every day | ORAL | Status: DC
  Administered 2015-08-16: 300 mg via ORAL
  Filled 2015-08-15: qty 2

## 2015-08-15 MED ORDER — FENTANYL CITRATE (PF) 100 MCG/2ML IJ SOLN: 25.0000 ug | INTRAMUSCULAR | Status: DC | PRN

## 2015-08-15 MED ORDER — CYCLOBENZAPRINE HCL 5 MG PO TABS: 5.0000 mg | ORAL_TABLET | Freq: Every day | ORAL | Status: DC | PRN

## 2015-08-15 MED ORDER — METHYLENE BLUE 0.5 % INJ SOLN
INTRAVENOUS | Status: AC
  Filled 2015-08-15: qty 10

## 2015-08-15 MED ORDER — AMLODIPINE BESYLATE 5 MG PO TABS
5.0000 mg | ORAL_TABLET | Freq: Every day | ORAL | Status: DC
  Filled 2015-08-15: qty 1

## 2015-08-15 MED ORDER — ENOXAPARIN SODIUM 40 MG/0.4ML ~~LOC~~ SOLN: 40.0000 mg | SUBCUTANEOUS | Status: DC

## 2015-08-15 MED ORDER — MIDAZOLAM HCL 2 MG/2ML IJ SOLN
INTRAMUSCULAR | Status: AC
  Administered 2015-08-15 (×2): 1 mg
  Filled 2015-08-15: qty 2

## 2015-08-15 MED ORDER — ROCURONIUM BROMIDE 50 MG/5ML IV SOLN
INTRAVENOUS | Status: AC
  Filled 2015-08-15: qty 1

## 2015-08-15 MED ORDER — ALPRAZOLAM 0.25 MG PO TABS
0.2500 mg | ORAL_TABLET | Freq: Two times a day (BID) | ORAL | Status: DC | PRN
  Administered 2015-08-15: 0.25 mg via ORAL
  Filled 2015-08-15: qty 1

## 2015-08-15 MED ORDER — TECHNETIUM TC 99M SULFUR COLLOID FILTERED
1.0000 | Freq: Once | INTRAVENOUS | Status: AC | PRN
Start: 2015-08-15 — End: 2015-08-15
  Administered 2015-08-15: 1 via INTRADERMAL

## 2015-08-15 MED ORDER — ONDANSETRON HCL 4 MG/2ML IJ SOLN
INTRAMUSCULAR | Status: DC | PRN
  Administered 2015-08-15: 4 mg via INTRAVENOUS

## 2015-08-15 MED ORDER — SODIUM CHLORIDE 0.9 % IV SOLN
INTRAVENOUS | Status: DC | PRN
  Administered 2015-08-15: 14:00:00

## 2015-08-15 MED ORDER — NITROGLYCERIN 0.4 MG SL SUBL: 0.4000 mg | SUBLINGUAL_TABLET | SUBLINGUAL | Status: DC | PRN

## 2015-08-15 MED ORDER — FENTANYL CITRATE (PF) 100 MCG/2ML IJ SOLN
INTRAMUSCULAR | Status: DC | PRN
  Administered 2015-08-15: 50 ug via INTRAVENOUS
  Administered 2015-08-15: 100 ug via INTRAVENOUS

## 2015-08-15 MED ORDER — CEFAZOLIN SODIUM-DEXTROSE 2-4 GM/100ML-% IV SOLN
2.0000 g | INTRAVENOUS | Status: AC
  Administered 2015-08-15: 2 g via INTRAVENOUS

## 2015-08-15 MED ORDER — TIZANIDINE HCL 2 MG PO TABS
4.0000 mg | ORAL_TABLET | Freq: Four times a day (QID) | ORAL | Status: DC | PRN
  Administered 2015-08-15: 4 mg via ORAL
  Filled 2015-08-15: qty 2

## 2015-08-15 MED ORDER — HEPARIN SOD (PORK) LOCK FLUSH 100 UNIT/ML IV SOLN
INTRAVENOUS | Status: AC
  Filled 2015-08-15: qty 5

## 2015-08-15 MED ORDER — MELOXICAM 7.5 MG PO TABS
15.0000 mg | ORAL_TABLET | Freq: Every day | ORAL | Status: DC
  Administered 2015-08-16: 15 mg via ORAL
  Filled 2015-08-15 (×2): qty 2

## 2015-08-15 MED ORDER — ONDANSETRON 4 MG PO TBDP: 4.0000 mg | ORAL_TABLET | Freq: Four times a day (QID) | ORAL | Status: DC | PRN

## 2015-08-15 MED ORDER — MIDAZOLAM HCL 5 MG/5ML IJ SOLN
INTRAMUSCULAR | Status: DC | PRN
  Administered 2015-08-15: 2 mg via INTRAVENOUS

## 2015-08-15 MED ORDER — FENTANYL CITRATE (PF) 250 MCG/5ML IJ SOLN
INTRAMUSCULAR | Status: AC
  Filled 2015-08-15: qty 5

## 2015-08-15 MED ORDER — CEFAZOLIN SODIUM-DEXTROSE 2-4 GM/100ML-% IV SOLN
INTRAVENOUS | Status: AC
  Filled 2015-08-15: qty 100

## 2015-08-15 MED ORDER — ALBUTEROL SULFATE HFA 108 (90 BASE) MCG/ACT IN AERS
INHALATION_SPRAY | RESPIRATORY_TRACT | Status: AC
  Filled 2015-08-15: qty 6.7

## 2015-08-15 MED ORDER — LEVOTHYROXINE SODIUM 75 MCG PO TABS
75.0000 ug | ORAL_TABLET | Freq: Every day | ORAL | Status: DC
  Administered 2015-08-16: 75 ug via ORAL
  Filled 2015-08-15: qty 1

## 2015-08-15 MED ORDER — POLYVINYL ALCOHOL 1.4 % OP SOLN
1.0000 [drp] | OPHTHALMIC | Status: DC | PRN
  Filled 2015-08-15: qty 15

## 2015-08-15 MED ORDER — POTASSIUM CHLORIDE 2 MEQ/ML IV SOLN
INTRAVENOUS | Status: DC
  Administered 2015-08-15 – 2015-08-16 (×2): via INTRAVENOUS
  Filled 2015-08-15 (×6): qty 1000

## 2015-08-15 MED ORDER — GLYCOPYRROLATE 0.2 MG/ML IV SOSY
PREFILLED_SYRINGE | INTRAVENOUS | Status: AC
Start: 2015-08-15 — End: 2015-08-15
  Filled 2015-08-15: qty 3

## 2015-08-15 MED ORDER — PROMETHAZINE HCL 25 MG PO TABS: 12.5000 mg | ORAL_TABLET | Freq: Every day | ORAL | Status: DC | PRN

## 2015-08-15 MED ORDER — ONDANSETRON HCL 4 MG/2ML IJ SOLN
INTRAMUSCULAR | Status: AC
  Filled 2015-08-15: qty 2

## 2015-08-15 MED ORDER — MECLIZINE HCL 25 MG PO TABS: 25.0000 mg | ORAL_TABLET | Freq: Every day | ORAL | Status: DC | PRN

## 2015-08-15 MED ORDER — ALBUTEROL SULFATE (2.5 MG/3ML) 0.083% IN NEBU: 3.0000 mL | INHALATION_SOLUTION | Freq: Four times a day (QID) | RESPIRATORY_TRACT | Status: DC | PRN

## 2015-08-15 MED ORDER — LIDOCAINE-EPINEPHRINE (PF) 1 %-1:200000 IJ SOLN
INTRAMUSCULAR | Status: AC
  Filled 2015-08-15: qty 30

## 2015-08-15 MED ORDER — MIDAZOLAM HCL 2 MG/2ML IJ SOLN
INTRAMUSCULAR | Status: AC
  Filled 2015-08-15: qty 2

## 2015-08-15 MED ORDER — POLYETHYL GLYCOL-PROPYL GLYCOL 0.4-0.3 % OP SOLN: 1.0000 [drp] | Freq: Three times a day (TID) | OPHTHALMIC | Status: DC | PRN

## 2015-08-15 MED ORDER — PHENYLEPHRINE 40 MCG/ML (10ML) SYRINGE FOR IV PUSH (FOR BLOOD PRESSURE SUPPORT)
PREFILLED_SYRINGE | INTRAVENOUS | Status: AC
  Filled 2015-08-15: qty 10

## 2015-08-15 MED ORDER — LACTATED RINGERS IV SOLN
INTRAVENOUS | Status: DC
  Administered 2015-08-15: 12:00:00 via INTRAVENOUS

## 2015-08-15 MED ORDER — ANASTROZOLE 1 MG PO TABS
1.0000 mg | ORAL_TABLET | Freq: Every day | ORAL | Status: DC
  Administered 2015-08-16: 1 mg via ORAL
  Filled 2015-08-15: qty 1

## 2015-08-15 MED ORDER — LIDOCAINE HCL (CARDIAC) 20 MG/ML IV SOLN
INTRAVENOUS | Status: DC | PRN
  Administered 2015-08-15: 75 mg via INTRAVENOUS

## 2015-08-15 MED ORDER — PROPOFOL 10 MG/ML IV BOLUS
INTRAVENOUS | Status: DC | PRN
  Administered 2015-08-15: 16 mg via INTRAVENOUS

## 2015-08-15 MED ORDER — ONDANSETRON HCL 4 MG/2ML IJ SOLN
4.0000 mg | Freq: Four times a day (QID) | INTRAMUSCULAR | Status: DC | PRN
  Administered 2015-08-15: 4 mg via INTRAVENOUS
  Filled 2015-08-15: qty 2

## 2015-08-15 MED ORDER — PROPOFOL 10 MG/ML IV BOLUS
INTRAVENOUS | Status: AC
  Filled 2015-08-15: qty 20

## 2015-08-15 MED ORDER — SUGAMMADEX SODIUM 200 MG/2ML IV SOLN
INTRAVENOUS | Status: DC | PRN
  Administered 2015-08-15: 186 mg via INTRAVENOUS

## 2015-08-15 MED ORDER — 0.9 % SODIUM CHLORIDE (POUR BTL) OPTIME
TOPICAL | Status: DC | PRN
  Administered 2015-08-15: 1000 mL

## 2015-08-15 MED ORDER — SODIUM CHLORIDE 0.9 % IJ SOLN
INTRAVENOUS | Status: DC | PRN
  Administered 2015-08-15: 14:00:00

## 2015-08-15 MED ORDER — IRBESARTAN 150 MG PO TABS
150.0000 mg | ORAL_TABLET | Freq: Every day | ORAL | Status: DC
  Administered 2015-08-15 – 2015-08-16 (×2): 150 mg via ORAL
  Filled 2015-08-15 (×2): qty 1

## 2015-08-15 MED ORDER — OXYCODONE HCL 5 MG PO TABS
5.0000 mg | ORAL_TABLET | ORAL | Status: DC | PRN
  Administered 2015-08-15 – 2015-08-16 (×2): 10 mg via ORAL
  Filled 2015-08-15 (×2): qty 2

## 2015-08-15 MED ORDER — FAMOTIDINE 20 MG PO TABS: 20.0000 mg | ORAL_TABLET | Freq: Every day | ORAL | Status: DC

## 2015-08-15 MED ORDER — FENTANYL CITRATE (PF) 100 MCG/2ML IJ SOLN
INTRAMUSCULAR | Status: AC
  Administered 2015-08-15 (×2): 50 ug
  Filled 2015-08-15: qty 2

## 2015-08-15 MED ORDER — LIDOCAINE 2% (20 MG/ML) 5 ML SYRINGE
INTRAMUSCULAR | Status: AC
  Filled 2015-08-15: qty 10

## 2015-08-15 MED ORDER — CEFAZOLIN SODIUM-DEXTROSE 2-4 GM/100ML-% IV SOLN
2.0000 g | Freq: Three times a day (TID) | INTRAVENOUS | Status: AC
  Administered 2015-08-15: 2 g via INTRAVENOUS
  Filled 2015-08-15: qty 100

## 2015-08-15 MED ORDER — DEXAMETHASONE SODIUM PHOSPHATE 10 MG/ML IJ SOLN
INTRAMUSCULAR | Status: DC | PRN
  Administered 2015-08-15: 5 mg via INTRAVENOUS

## 2015-08-15 SURGICAL SUPPLY — 80 items
ADH SKN CLS APL DERMABOND .7 (GAUZE/BANDAGES/DRESSINGS) ×1
APPLIER CLIP 9.375 MED OPEN (MISCELLANEOUS) ×4
APR CLP MED 9.3 20 MLT OPN (MISCELLANEOUS) ×1
BAG DECANTER FOR FLEXI CONT (MISCELLANEOUS) ×4 IMPLANT
BINDER BREAST XLRG (GAUZE/BANDAGES/DRESSINGS) ×4 IMPLANT
BLADE SURG 11 STRL SS (BLADE) ×4 IMPLANT
BLADE SURG 15 STRL LF DISP TIS (BLADE) ×2 IMPLANT
BLADE SURG 15 STRL SS (BLADE) ×4
BLADE SURG ROTATE 9660 (MISCELLANEOUS) IMPLANT
CANISTER SUCTION 2500CC (MISCELLANEOUS) ×4 IMPLANT
CHLORAPREP W/TINT 26ML (MISCELLANEOUS) ×4 IMPLANT
CLIP APPLIE 9.375 MED OPEN (MISCELLANEOUS) ×2 IMPLANT
CONT SPEC 4OZ CLIKSEAL STRL BL (MISCELLANEOUS) ×4 IMPLANT
CONT SPECI 4OZ STER CLIK (MISCELLANEOUS) ×4 IMPLANT
COVER PROBE W GEL 5X96 (DRAPES) ×4 IMPLANT
COVER SURGICAL LIGHT HANDLE (MISCELLANEOUS) ×4 IMPLANT
COVER TRANSDUCER ULTRASND GEL (DRAPE) IMPLANT
CRADLE DONUT ADULT HEAD (MISCELLANEOUS) ×4 IMPLANT
DERMABOND ADVANCED (GAUZE/BANDAGES/DRESSINGS) ×2
DERMABOND ADVANCED .7 DNX12 (GAUZE/BANDAGES/DRESSINGS) ×2 IMPLANT
DEVICE DISSECT PLASMABLAD 3.0S (MISCELLANEOUS) IMPLANT
DRAIN CHANNEL 19F RND (DRAIN) ×4 IMPLANT
DRAPE C-ARM 42X72 X-RAY (DRAPES) ×4 IMPLANT
DRAPE CHEST BREAST 15X10 FENES (DRAPES) ×4 IMPLANT
DRAPE LAPAROSCOPIC ABDOMINAL (DRAPES) ×4 IMPLANT
DRAPE PROXIMA HALF (DRAPES) ×4 IMPLANT
DRAPE UTILITY XL STRL (DRAPES) ×8 IMPLANT
DRSG ADAPTIC 3X8 NADH LF (GAUZE/BANDAGES/DRESSINGS) ×4 IMPLANT
DRSG PAD ABDOMINAL 8X10 ST (GAUZE/BANDAGES/DRESSINGS) ×4 IMPLANT
ELECT BLADE 4.0 EZ CLEAN MEGAD (MISCELLANEOUS) ×4
ELECT CAUTERY BLADE 6.4 (BLADE) ×4 IMPLANT
ELECT REM PT RETURN 9FT ADLT (ELECTROSURGICAL) ×4
ELECTRODE BLDE 4.0 EZ CLN MEGD (MISCELLANEOUS) ×2 IMPLANT
ELECTRODE REM PT RTRN 9FT ADLT (ELECTROSURGICAL) ×2 IMPLANT
EVACUATOR SILICONE 100CC (DRAIN) ×4 IMPLANT
GAUZE SPONGE 4X4 16PLY XRAY LF (GAUZE/BANDAGES/DRESSINGS) ×4 IMPLANT
GLOVE EUDERMIC 7 POWDERFREE (GLOVE) ×4 IMPLANT
GOWN STRL REUS W/ TWL LRG LVL3 (GOWN DISPOSABLE) ×4 IMPLANT
GOWN STRL REUS W/ TWL XL LVL3 (GOWN DISPOSABLE) ×2 IMPLANT
GOWN STRL REUS W/TWL LRG LVL3 (GOWN DISPOSABLE) ×8
GOWN STRL REUS W/TWL XL LVL3 (GOWN DISPOSABLE) ×4
ILLUMINATOR WAVEGUIDE N/F (MISCELLANEOUS) IMPLANT
INTRODUCER 13FR (MISCELLANEOUS) IMPLANT
INTRODUCER COOK 11FR (CATHETERS) IMPLANT
KIT BASIN OR (CUSTOM PROCEDURE TRAY) ×4 IMPLANT
KIT PORT POWER 8FR ISP CVUE (Catheter) ×4 IMPLANT
KIT ROOM TURNOVER OR (KITS) ×4 IMPLANT
LIGHT WAVEGUIDE WIDE FLAT (MISCELLANEOUS) IMPLANT
NEEDLE 18GX1X1/2 (RX/OR ONLY) (NEEDLE) ×4 IMPLANT
NEEDLE FILTER BLUNT 18X 1/2SAF (NEEDLE)
NEEDLE FILTER BLUNT 18X1 1/2 (NEEDLE) IMPLANT
NEEDLE HYPO 25GX1X1/2 BEV (NEEDLE) ×8 IMPLANT
NS IRRIG 1000ML POUR BTL (IV SOLUTION) ×4 IMPLANT
PACK GENERAL/GYN (CUSTOM PROCEDURE TRAY) ×4 IMPLANT
PACK SURGICAL SETUP 50X90 (CUSTOM PROCEDURE TRAY) ×4 IMPLANT
PAD ARMBOARD 7.5X6 YLW CONV (MISCELLANEOUS) ×4 IMPLANT
PENCIL BUTTON HOLSTER BLD 10FT (ELECTRODE) ×4 IMPLANT
PLASMABLADE 3.0S (MISCELLANEOUS)
SET INTRODUCER 12FR PACEMAKER (SHEATH) IMPLANT
SET SHEATH INTRODUCER 10FR (MISCELLANEOUS) IMPLANT
SHEATH COOK PEEL AWAY SET 9F (SHEATH) IMPLANT
SPECIMEN JAR X LARGE (MISCELLANEOUS) ×4 IMPLANT
SPONGE GAUZE 4X4 12PLY STER LF (GAUZE/BANDAGES/DRESSINGS) ×4 IMPLANT
STAPLER VISISTAT 35W (STAPLE) ×4 IMPLANT
SURGILUBE 3G PEEL PACK STRL (MISCELLANEOUS) IMPLANT
SUT ETHILON 3 0 FSL (SUTURE) ×16 IMPLANT
SUT MNCRL AB 4-0 PS2 18 (SUTURE) ×4 IMPLANT
SUT PROLENE 2 0 CT2 30 (SUTURE) ×4 IMPLANT
SUT SILK 2 0 FS (SUTURE) ×4 IMPLANT
SUT VIC AB 2-0 BRD 54 (SUTURE) ×4 IMPLANT
SUT VIC AB 3-0 SH 18 (SUTURE) ×4 IMPLANT
SYR 5ML LUER SLIP (SYRINGE) ×4 IMPLANT
SYR CONTROL 10ML LL (SYRINGE) ×4 IMPLANT
SYRINGE 10CC LL (SYRINGE) ×8 IMPLANT
TAPE CLOTH SURG 4X10 WHT LF (GAUZE/BANDAGES/DRESSINGS) ×4 IMPLANT
TOWEL OR 17X24 6PK STRL BLUE (TOWEL DISPOSABLE) ×4 IMPLANT
TOWEL OR 17X26 10 PK STRL BLUE (TOWEL DISPOSABLE) ×4 IMPLANT
TUBE CONNECTING 12'X1/4 (SUCTIONS)
TUBE CONNECTING 12X1/4 (SUCTIONS) IMPLANT
YANKAUER SUCT BULB TIP NO VENT (SUCTIONS) IMPLANT

## 2015-08-15 NOTE — Progress Notes (Signed)
Arrived to 6n5 from PACU, oriented to room and surroundings, daughter at bedside, denies nausea/pain

## 2015-08-15 NOTE — Transfer of Care (Signed)
Immediate Anesthesia Transfer of Care Note  Patient: Tracey Evans  Procedure(s) Performed: Procedure(s): TOTAL MASTECTOMY WITH SENTINEL LYMPH NODE BIOPSY AND BLUE DYE INJECTION  (Right) INSERTION PORT-A-CATH  (Left)  Patient Location: PACU  Anesthesia Type:GA combined with regional for post-op pain  Level of Consciousness: awake, alert  and oriented  Airway & Oxygen Therapy: Patient Spontanous Breathing and Patient connected to nasal cannula oxygen  Post-op Assessment: Report given to RN and Post -op Vital signs reviewed and stable  Post vital signs: Reviewed and stable  Last Vitals:  Filed Vitals:   08/15/15 1325 08/15/15 1611  BP: 142/75   Pulse: 87   Temp:  36.5 C  Resp:      Last Pain:  Filed Vitals:   08/15/15 1611  PainSc: 10-Worst pain ever      Patients Stated Pain Goal: 10 ([id number])  Complications: No apparent anesthesia complications

## 2015-08-15 NOTE — Anesthesia Postprocedure Evaluation (Signed)
Anesthesia Post Note  Patient: Tracey Evans  Procedure(s) Performed: Procedure(s) (LRB): TOTAL MASTECTOMY WITH SENTINEL LYMPH NODE BIOPSY AND BLUE DYE INJECTION  (Right) INSERTION PORT-A-CATH  (Left)  Patient location during evaluation: PACU Anesthesia Type: General and Regional Level of consciousness: awake and alert Pain management: pain level controlled Vital Signs Assessment: post-procedure vital signs reviewed and stable Respiratory status: spontaneous breathing, nonlabored ventilation, respiratory function stable and patient connected to nasal cannula oxygen Cardiovascular status: blood pressure returned to baseline and stable Postop Assessment: no signs of nausea or vomiting Anesthetic complications: no    Last Vitals:  Filed Vitals:   08/15/15 1700 08/15/15 1711  BP:  112/79  Pulse: 81 83  Temp:  36.3 C  Resp: 16 16    Last Pain:  Filed Vitals:   08/15/15 1713  PainSc: 0-No pain                 Maxxon Schwanke,W. EDMOND

## 2015-08-15 NOTE — Op Note (Addendum)
Patient Name:           Tracey Evans   Date of Surgery:        08/15/2015  Pre op Diagnosis:      Multifocal cancer of the right breast  Post op Diagnosis:    Same  Procedure:                 Insertion of 8 [place] PowerPort ClearVue tunneled venous vascular access device                                      Use of fluoroscopy for guidance and positioning                                      Inject blue dye right breast                                      Right total mastectomy                                      Right axillary sentinel node biopsy    Surgeon:                     Edsel Petrin. Dalbert Batman, M.D., FACS  Assistant:                    OR staff   Operative Indications:   The patient is a 70 year old female who presents with multifocal right breast cancer.   Dr. Terrence Dupont is her cardiologist, Dr. Birdie Riddle is her PCP. She was initially evaluated in the [facility] on July 13, 2015 by Dr. Lindi Adie, Dr. Lisbeth Renshaw, and me.     Past breast history reveals biopsy at age 89 for benign disease. Recent screening mammogram showed 2 cancers in the right breast. First, in the far posterior inferior breast at 6:00 is a 1.8 cm mass near the inframammary fold which was biopsied and showed a grade 2-3 invasive carcinoma. This is a triple positive tumor. Second cancer is in the right breast a more anterior and more medial, at least 6 cm away and this was biopsied and showed high-grade DCIS, receptor positive. Ultrasound of the right axilla is negative.     Subsequent MRI of the breasts show a 2.3 cm mass in the right breast at the 6 o'clock position consistent with the invasive cancer. There is also an area of non-mass-like enhancement in the central medial right  breast consistent with the ductal carcinoma in situ. No abnormal lymph nodes anywhere. Left breast looks normal.    The patient is currently taking Arimidex. She seen Dr. Terrence Dupont and cardiac clearance has been completed. He has cleared her for  surgery. She's had genetic testing which is negative. She does have a strong family history of breast cancer in 2 sisters and 2 nieces. One niece died of metastatic disease.     She takes albuterol inhaler. For anxiety she takes alprazolam, Wellbutrin, and Klonopin. For her fibromyalgia she takes Voltaren, Flexeril, meloxicam and tramadol. for hypertension and she takes amlodipine and valsartan. She is taking anastrozole for her breast  cancer. She takes aspirin 81 mg a day and I advised her to stop that about 3 days preoperatively. She takes an Lipitor for hypercholesterolemia state Zantac for reflux symptoms. She takes nitroglycerin for stable angina. She takes Synthroid for hypothyroidism.   . We spent a long time talking about unilateral versus bilateral mastectomy. Double lumpectomy versus mastectomy. Radiation therapy and the indications for that. She has decided she does not want prophylactic left mastectomy, and I support that decision since there is no survival benefit that we know of. We talked about double lumpectomy and the deformity that would occur with that which would be fairly significant and will either require plastic surgical revisions or a prosthesis. I told her there was no good data on recurrence rates for double lumpectomy.  She declines plastic surgical reconstruction at this time.    She is brought to the operating room electively for Port-A-Cath insertion, right total mastectomy and right x-ray sentinel node biopsy   Operative Findings:       The anesthesiologist performed a right pectoral block in the holding area.  The nuclear medicine technician injection technetium 99 into the right breast in the holding area.  There was a palpable tumor in the right breast at the 6 clock position, at the inframammary crease.  This did not appear to be invading the skin or the muscle, but I had to make a very low transverse incision to get around this.  This made the closure under  some tension but it did close primarily.  I found 3 sentinel lymph nodes, one of which was somewhat hard in texture.    Procedure in Detail:          Finding induction of general endotracheal anesthesia a surgical timeout was performed.  Intravenous antibiotics were given.  Following alcohol prep I injected 5 mL of blue dye into the right breast, subareolar area.  This was methylene blue mixed with saline.  The right breast was massaged for a few minutes.    We did position the patient with a small roll behind her shoulders and her arms tucked at her sides.  We prepped the neck and both chests.  0.5% Marcaine with epinephrine was used as a local infiltration anesthetic.  A left subclavian venipuncture was performed and with good blood return on a single pass.  Guidewire was inserted into the central venous circulation.  C-arm was used and showed that the wire was in the superior vena cava.  Using the C-arm I marked a template on the chest wall so I could  measure the catheter length properly with the intent of getting the catheter tip in the distal superior vena cava just above the atrium.  A transverse incision was made below the mid clavicle and a subcutaneous pocket was created.  Using a tunneling device I passed the catheter from the port pocket site to the wire insertion site.  A small incision was made at the wire insertion site.  Using the template marking the chest wall I measured and cut the catheter 21.5 cm.  The catheter was secured to the port with the locking device and the port and catheter flushed with heparinized saline.  The port was sutured to the pectoralis fasia with 3 sutures of 2-0 Prolene.  I inserted the dilator and peel-away sheath over the guidewire and then removed the dilator and wire.  The catheter was threaded and peel-away sheath removed.  I had excellent blood return and the catheter flushed well.  Fluoroscopy showed the catheter tip was in the distal superior vena cava and there  is no deformity of the catheter.  I flushed the catheter and port with concentrated heparin.  There was no bleeding.  The subcutaneous tissue was closed with 3-0 Vicryl sutures and skin closed with subcuticular 4-0 Monocryl and Dermabond.    The patient was then repositioned and reprepped and draped.  We brought her arms at her sides and prepped the entire neck and chest wall and axilla on the right side.  Spent some time planning the incision.  Basically transverse elliptical incision going down below the inframammary clean crease centrally and going up onto the breasts skin above the nipple and areola.  The incision was made.  Skin flaps were raised superiorly to the infraclavicular area, medially to the parasternal area, inferiorly to the anterior rectus sheath, and laterally to the anterior border of the latissimus dorsi muscle.  Using the neoprobe I dissected up into the axilla and found 3 sentinel lymph nodes and this is all that I could find.  These were sent for routine histology.  The breast was dissected off of the pectoralis major and minor muscles.  The lateral skin was marked with a silk suture and the specimen sent to the lab.  Hemostasis was excellent and achieved with electrocautery and metal clips.  Two 19 French Blake drains were placed, one up into the axilla and one across the skin flaps with these were brought out through separate separate stab incisions inferolaterally, sutured the skin and connected to suction bulbs.  The skin was under a little too much tension for a plastic closure and so I closed the skin with interrupted mattress sutures of 3-0 nylon and skin staples.  Occlusive bandage was placed.  Breast binder was placed.  The patient tolerated the procedure well was taken to PACU in stable condition.  EBL 150 mL.  Counts correct.  Complications none.     Edsel Petrin. Dalbert Batman, M.D., FACS General and Minimally Invasive Surgery Breast and Colorectal Surgery  08/15/2015 3:55 PM

## 2015-08-15 NOTE — Anesthesia Preprocedure Evaluation (Addendum)
Anesthesia Evaluation  Patient identified by MRN, date of birth, ID band Patient awake    Airway Mallampati: II  TM Distance: >3 FB Neck ROM: Full    Dental   Pulmonary shortness of breath, asthma ,    breath sounds clear to auscultation       Cardiovascular hypertension, + angina  Rhythm:Regular Rate:Normal     Neuro/Psych    GI/Hepatic Neg liver ROS, PUD, GERD  ,  Endo/Other  diabetes  Renal/GU negative Renal ROS     Musculoskeletal   Abdominal   Peds  Hematology   Anesthesia Other Findings   Reproductive/Obstetrics                           Anesthesia Physical Anesthesia Plan  ASA: III  Anesthesia Plan: General   Post-op Pain Management:    Induction: Intravenous  Airway Management Planned: Oral ETT  Additional Equipment:   Intra-op Plan:   Post-operative Plan: Extubation in OR  Informed Consent: I have reviewed the patients History and Physical, chart, labs and discussed the procedure including the risks, benefits and alternatives for the proposed anesthesia with the patient or authorized representative who has indicated his/her understanding and acceptance.   Dental advisory given  Plan Discussed with: CRNA, Anesthesiologist and Surgeon  Anesthesia Plan Comments:        Anesthesia Quick Evaluation

## 2015-08-15 NOTE — Interval H&P Note (Signed)
History and Physical Interval Note:  08/15/2015 12:48 PM  Tracey Evans  has presented today for surgery, with the diagnosis of RIGHT BREAST CANCER  The various methods of treatment have been discussed with the patient and family. After consideration of risks, benefits and other options for treatment, the patient has consented to  Procedure(s): TOTAL MASTECTOMY WITH SENTINEL LYMPH NODE BIOPSY AND BLUE DYE INJECTION  (Right) INSERTION PORT-A-CATH WITH ULTRSOUND (N/A) as a surgical intervention .  The patient's history has been reviewed, patient examined, no change in status, stable for surgery.  I have reviewed the patient's chart and labs.  Questions were answered to the patient's satisfaction.     Adin Hector

## 2015-08-15 NOTE — Anesthesia Procedure Notes (Addendum)
Procedure Name: Intubation Date/Time: 08/15/2015 1:48 PM Performed by: Eligha Bridegroom Pre-anesthesia Checklist: Emergency Drugs available, Timeout performed, Patient identified, Suction available and Patient being monitored Patient Re-evaluated:Patient Re-evaluated prior to inductionOxygen Delivery Method: Circle system utilized Preoxygenation: Pre-oxygenation with 100% oxygen Intubation Type: IV induction Ventilation: Mask ventilation without difficulty and Oral airway inserted - appropriate to patient size Laryngoscope Size: Mac and 3 Grade View: Grade II Tube type: Oral Tube size: 7.0 mm Number of attempts: 1 Airway Equipment and Method: Stylet Placement Confirmation: ETT inserted through vocal cords under direct vision,  breath sounds checked- equal and bilateral and positive ETCO2 Secured at: 21 cm Tube secured with: Tape Dental Injury: Teeth and Oropharynx as per pre-operative assessment    Anesthesia Regional Block:  Pectoralis block  Pre-Anesthetic Checklist: ,, timeout performed, Correct Patient, Correct Site, Correct Laterality, Correct Procedure, Correct Position, site marked, Risks and benefits discussed,  Surgical consent,  Pre-op evaluation,  At surgeon's request and post-op pain management  Laterality: Right  Prep: chloraprep       Needles:   Needle Type: Stimulator Needle - 40          Additional Needles:  Procedures: Doppler guided and ultrasound guided (picture in chart) Pectoralis block Narrative:  Start time: 08/15/2015 12:55 PM End time: 08/15/2015 1:10 PM Injection made incrementally with aspirations every 5 mL.  Performed by: Personally

## 2015-08-16 ENCOUNTER — Telehealth: Payer: Self-pay

## 2015-08-16 ENCOUNTER — Other Ambulatory Visit (HOSPITAL_COMMUNITY): Payer: Medicare Other

## 2015-08-16 ENCOUNTER — Other Ambulatory Visit: Payer: Self-pay | Admitting: Hematology and Oncology

## 2015-08-16 ENCOUNTER — Encounter (HOSPITAL_COMMUNITY): Payer: Self-pay | Admitting: General Surgery

## 2015-08-16 NOTE — Progress Notes (Signed)
Discharge papers gone over with pt. No prescriptions to give to pt. INstructed pt. On wound care and provided a couple of extra abdominal pads/gauze for pt. Instructed pt. And her daugther on how to empty and record JP drains. No questions/complaints from pt. Or her daughter. IV taken out. Pt. Discharged via w/c successfully.

## 2015-08-16 NOTE — Care Management (Signed)
Referral for elevated commode seat. Unable to order through hospital , however, ordered  3 in 1 which can also be placed over commode seat to elevate. Magdalen Spatz RN BSN

## 2015-08-16 NOTE — Discharge Instructions (Signed)
[facility]___[facility] [place] °[phone] ° °MASTECTOMY: POST OP INSTRUCTIONS ° °Always review your discharge instruction sheet given to you by the facility where your surgery was performed. °IF YOU HAVE DISABILITY OR FAMILY LEAVE FORMS, YOU MUST BRING THEM TO THE OFFICE FOR PROCESSING.   °DO NOT GIVE THEM TO YOUR DOCTOR. °A prescription for pain medication may be given to you upon discharge.  Take your pain medication as prescribed, if needed.  If narcotic pain medicine is not needed, then you may take acetaminophen (Tylenol) or ibuprofen (Advil) as needed. °1. Take your usually prescribed medications unless otherwise directed. °2. If you need a refill on your pain medication, please contact your pharmacy.  They will contact our office to request authorization.  Prescriptions will not be filled after 5pm or on week-ends. °3. You should follow a light diet the first few days after arrival home, such as soup and crackers, etc.  Resume your normal diet the day after surgery. °4. Most patients will experience some swelling and bruising on the chest and underarm.  Ice packs will help.  Swelling and bruising can take several days to resolve.  °5. It is common to experience some constipation if taking pain medication after surgery.  Increasing fluid intake and taking a stool softener (such as Colace) will usually help or prevent this problem from occurring.  A mild laxative (Milk of Magnesia or Miralax) should be taken according to package instructions if there are no bowel movements after 48 hours. °6. Unless discharge instructions indicate otherwise, leave your bandage dry and in place until your next appointment in 3-5 days.  You may take a limited sponge bath.  No tube baths or showers until the drains are removed.  You may have steri-strips (small skin tapes) in place directly over the incision.  These strips should be left on the skin for 7-10 days.  If your surgeon used skin glue on the incision, you may  shower in 24 hours.  The glue will flake off over the next 2-3 weeks.  Any sutures or staples will be removed at the office during your follow-up visit. °7. DRAINS:  If you have drains in place, it is important to keep a list of the amount of drainage produced each day in your drains.  Before leaving the hospital, you should be instructed on drain care.  Call our office if you have any questions about your drains. °8. ACTIVITIES:  You may resume regular (light) daily activities beginning the next day--such as daily self-care, walking, climbing stairs--gradually increasing activities as tolerated.  You may have sexual intercourse when it is comfortable.  Refrain from any heavy lifting or straining until approved by your doctor. °a. You may drive when you are no longer taking prescription pain medication, you can comfortably wear a seatbelt, and you can safely maneuver your car and apply brakes. °b. RETURN TO WORK:  __________________________________________________________ °9. You should see your doctor in the office for a follow-up appointment approximately 3-5 days after your surgery.  Your doctor’s nurse will typically make your follow-up appointment when she calls you with your pathology report.  Expect your pathology report 2-3 business days after your surgery.  You may call to check if you do not hear from us after three days.   °10. OTHER INSTRUCTIONS: ______________________________________________________________________________________________ ____________________________________________________________________________________________ °WHEN TO CALL YOUR DOCTOR: °1. Fever over 101.0 °2. Nausea and/or vomiting °3. Extreme swelling or bruising °4. Continued bleeding from incision. °5. Increased pain, redness, or drainage from the incision. °  The clinic staff is available to answer your questions during regular business hours.  Please dont hesitate to call and ask to speak to one of the nurses for clinical  concerns.  If you have a medical emergency, go to the nearest emergency room or call 911.  A surgeon from [facility] is always on call at the hospital. [address], [place], [place], [place]  [address] ? [address], [place], [place]   [address] [phone] ? [phone] ? FAX [phone] Web site: [email or web address] [phone]  MASTECTOMY: POST OP INSTRUCTIONS  Always review your discharge instruction sheet given to you by the facility where your surgery was performed. IF YOU HAVE DISABILITY OR FAMILY LEAVE FORMS, YOU MUST BRING THEM TO THE OFFICE FOR PROCESSING.   DO NOT GIVE THEM TO YOUR DOCTOR. A prescription for pain medication may be given to you upon discharge.  Take your pain medication as prescribed, if needed.  If narcotic pain medicine is not needed, then you may take acetaminophen (Tylenol) or ibuprofen (Advil) as needed. 11. Take your usually prescribed medications unless otherwise directed. 12. If you need a refill on your pain medication, please contact your pharmacy.  They will contact our office to request authorization.  Prescriptions will not be filled after 5pm or on week-ends. 13. You should follow a light diet the first few days after arrival home, such as soup and crackers, etc.  Resume your normal diet the day after surgery. 14. Most patients will experience some swelling and bruising on the chest and underarm.  Ice packs will help.  Swelling and bruising can take several days to resolve.  15. It is common to experience some constipation if taking pain medication after surgery.  Increasing fluid intake and taking a stool softener (such as Colace) will usually help or prevent this problem from occurring.  A mild laxative (Milk of Magnesia or Miralax) should be taken according to package instructions if there are no bowel movements after 48 hours. 16. Unless discharge instructions indicate otherwise, leave your bandage dry  and in place until your next appointment in 3-5 days.  You may take a limited sponge bath.  No tube baths or showers until the drains are removed.  You may have steri-strips (small skin tapes) in place directly over the incision.  These strips should be left on the skin for 7-10 days.  If your surgeon used skin glue on the incision, you may shower in 24 hours.  The glue will flake off over the next 2-3 weeks.  Any sutures or staples will be removed at the office during your follow-up visit. 35. DRAINS:  If you have drains in place, it is important to keep a list of the amount of drainage produced each day in your drains.  Before leaving the hospital, you should be instructed on drain care.  Call our office if you have any questions about your drains. 18. ACTIVITIES:  You may resume regular (light) daily activities beginning the next day--such as daily self-care, walking, climbing stairs--gradually increasing activities as tolerated.  You may have sexual intercourse when it is comfortable.  Refrain from any heavy lifting or straining until approved by your doctor. a. You may drive when you are no longer taking prescription pain medication, you can comfortably wear a seatbelt, and you can safely maneuver your car and apply brakes. b. RETURN TO WORK:  __________________________________________________________ 19. You should see your doctor in the office for a follow-up  appointment approximately 3-5 days after your surgery.  Your doctors nurse will typically make your follow-up appointment when she calls you with your pathology report.  Expect your pathology report 2-3 business days after your surgery.  You may call to check if you do not hear from [place] after three days.   20. OTHER INSTRUCTIONS: ______________________________________________________________________________________________ ____________________________________________________________________________________________ WHEN TO CALL YOUR  DOCTOR: 6. Fever over 101.0 7. Nausea and/or vomiting 8. Extreme swelling or bruising 9. Continued bleeding from incision. 10. Increased pain, redness, or drainage from the incision. The clinic staff is available to answer your questions during regular business hours.  Please dont hesitate to call and ask to speak to one of the nurses for clinical concerns.  If you have a medical emergency, go to the nearest emergency room or call 911.  A surgeon from [facility] is always on call at the hospital. [address], [place], [place], [place]  [address] ? [address], [place], [place]   [address] [phone] ? [phone] ? FAX [phone] Web site: [email or web address]

## 2015-08-16 NOTE — Discharge Summary (Signed)
Patient ID: Tracey Evans [id number] y.o. 05/11/1945  Admit date: 08/15/2015  Discharge date and time: 08/16/2015  Admitting Physician: Adin Hector  Discharge Physician: Adin Hector  Admission Diagnoses: RIGHT BREAST CANCER  Discharge Diagnoses: Multifocal cancer right breast  Operations: Procedure(s): TOTAL MASTECTOMY WITH SENTINEL LYMPH NODE BIOPSY AND BLUE DYE INJECTION  INSERTION PORT-A-CATH   Admission Condition: good  Discharged Condition: good  Indication for Admission: The patient is a 70 year old female who presents with multifocal right breast cancer.  Dr. Terrence Dupont is her cardiologist, Dr. Birdie Riddle is her PCP. She was initially evaluated in the [facility] on July 13, 2015 by Dr. Lindi Adie, Dr. Lisbeth Renshaw, and me.  Past breast history reveals biopsy at age 83 for benign disease. Recent screening mammogram showed 2 cancers in the right breast. First, in the far posterior inferior breast at 6:00 is a 1.8 cm mass near the inframammary fold which was biopsied and showed a grade 2-3 invasive carcinoma. This is a triple positive tumor. Second cancer is in the right breast a more anterior and more medial, at least 6 cm away and this was biopsied and showed high-grade DCIS, receptor positive. Ultrasound of the right axilla is negative.  Subsequent MRI of the breasts show a 2.3 cm mass in the right breast at the 6 o'clock position consistent with the invasive cancer. There is also an area of non-mass-like enhancement in the central medial right breast consistent with the ductal carcinoma in situ. No abnormal lymph nodes anywhere. Left breast looks normal.  The patient is currently taking Arimidex. She seen Dr. Terrence Dupont and cardiac clearance has been completed. He has cleared her for surgery. She's had genetic testing which is negative. She does have a strong family history of breast cancer in 2 sisters and 2 nieces. One niece died of metastatic disease.  She  takes albuterol inhaler. For anxiety she takes alprazolam, Wellbutrin, and Klonopin. For her fibromyalgia she takes Voltaren, Flexeril, meloxicam and tramadol. for hypertension and she takes amlodipine and valsartan. She is taking anastrozole for her breast cancer. She takes aspirin 81 mg a day and I advised her to stop that about 3 days preoperatively. She takes an Lipitor for hypercholesterolemia state Zantac for reflux symptoms. She takes nitroglycerin for stable angina. She takes Synthroid for hypothyroidism.  . We spent a long time talking about unilateral versus bilateral mastectomy. Double lumpectomy versus mastectomy. Radiation therapy and the indications for that. She has decided she does not want prophylactic left mastectomy, and I support that decision since there is no survival benefit that we know of. We talked about double lumpectomy and the deformity that would occur with that which would be fairly significant and will either require plastic surgical revisions or a prosthesis. I told her there was no good data on recurrence rates for double lumpectomy. She declines plastic surgical reconstruction at this time.  She is brought to the operating room electively for Port-A-Cath insertion, right total mastectomy and right x-ray sentinel node biopsy   Hospital Course: On the day of admission the patient was taken to operating room and underwent Port-A-Cath insertion, right total mastectomy, and right axillary sentinel node biopsy.  The procedures were uneventful.  The chest x-ray looks good postop and is documented.  The patient was observed overnight on 6 N.  She did very well.  The following morning she had no pain.  She had reported a headache overnight that had resolved.  She had no arm numbness or swelling.  On  exam the right mastectomy wound looked good.  Skin flaps healthy and intact.  No hematoma or seroma.  Drainage was serosanguineous and both drains were functioning.  She  could move her shoulder around.  There was no sensory deficit under the right arm.  She wanted to go home and she seemed to meet all discharge criteria.  She does not need any pain medicine.  She has tramadol and Aleve at home.  She was told to continue all of her usual medications.  Wound care instructions were given.  Diet and activity instructions were given.  She has an appointment to follow-up with me in the office next week.  She will also follow-up with Dr. Lindi Adie in a few weeks to discuss adjuvant chemotherapy plans.  Consults: None  Significant Diagnostic Studies: Surgical pathology, pending.  Chest x-ray.  Treatments: surgery: Port-A-Cath insertion.  Right total mastectomy.  Right sentinel lymph node biopsy.  Disposition: Home  Patient Instructions:    Medication List    ASK your doctor about these medications        albuterol 108 (90 Base) MCG/ACT inhaler  Commonly known as:  PROAIR HFA  Inhale 2 puffs into the lungs every 6 (six) hours as needed for wheezing.     ALPRAZolam 1 MG tablet  Commonly known as:  XANAX  TAKE 1 TABLET BY MOUTH TWICE DAILY AS NEEDED FOR ANXIETY     amLODipine-valsartan 5-160 MG tablet  Commonly known as:  EXFORGE  Take 1 tablet by mouth daily.     anastrozole 1 MG tablet  Commonly known as:  ARIMIDEX  Take 1 tablet (1 mg total) by mouth daily.     aspirin 81 MG tablet  Take 81 mg by mouth daily.     atorvastatin 20 MG tablet  Commonly known as:  LIPITOR  TAKE 1 TABLET BY MOUTH EVERY DAY     buPROPion 300 MG 24 hr tablet  Commonly known as:  WELLBUTRIN XL  TAKE 1 TABLET BY MOUTH EVERY DAY     clonazePAM 0.5 MG tablet  Commonly known as:  KLONOPIN  TAKE 1 TABLET BY MOUTH AT BEDTIME AS NEEDED     FLEXERIL 5 MG tablet  Generic drug:  cyclobenzaprine  Take 5 mg by mouth daily as needed for muscle spasms.     levothyroxine 75 MCG tablet  Commonly known as:  SYNTHROID, LEVOTHROID  TAKE 1 TABLET BY MOUTH DAILY.     meclizine 25 MG  tablet  Commonly known as:  ANTIVERT  Take 25 mg by mouth daily as needed for nausea.     meloxicam 15 MG tablet  Commonly known as:  MOBIC  Take 15 mg by mouth daily.     nitroGLYCERIN 0.4 MG SL tablet  Commonly known as:  NITROSTAT  Place 1 tablet (0.4 mg total) under the tongue every 5 (five) minutes as needed. As needed for chest pain     promethazine 12.5 MG tablet  Commonly known as:  PHENERGAN  Take 12.5 mg by mouth daily as needed for nausea.     ranitidine 150 MG tablet  Commonly known as:  ZANTAC  Take 150 mg by mouth 2 (two) times daily as needed for heartburn.     SYSTANE ULTRA 0.4-0.3 % Soln  Generic drug:  Polyethyl Glycol-Propyl Glycol  Place 1 drop into both eyes 3 (three) times daily as needed.     tiZANidine 4 MG tablet  Commonly known as:  ZANAFLEX  Take 4 mg  by mouth every 6 (six) hours as needed for muscle spasms.     traMADol 50 MG tablet  Commonly known as:  ULTRAM  Take 50 mg by mouth daily as needed for moderate pain.        Activity: activity as tolerated Diet: low fat, low cholesterol diet Wound Care: as directed  Follow-up:  With Dr. Dalbert Batman in 1 week.  Signed: Edsel Petrin. Dalbert Batman, M.D., FACS General and minimally invasive surgery Breast and Colorectal Surgery  08/16/2015, 7:33 AM

## 2015-08-16 NOTE — Telephone Encounter (Signed)
Received call from pt regarding Arimidex refill and why her pharmacy received a refill rejection.  I explained to pt that the plan was for her to take this for 1 month leading up until surgery and then to discontinue Arimidex at time of surgery per Gudena's office note on 07/13/15.  Pt verbalized understanding of plan of care and has no further questions at time of call.  Pt states she just got home after being in hospital overnight for her surgery and reports she is doing good with no complaints or concerns.

## 2015-08-17 NOTE — Progress Notes (Signed)
Quick Note:  Inform patient of Pathology report,. Cancer 2.2 cm. Nodes negative. Margins negative. No more surgery required. Good News.let m eknow you got her. hmi ______

## 2015-08-21 DIAGNOSIS — Z1379 Encounter for other screening for genetic and chromosomal anomalies: Secondary | ICD-10-CM | POA: Insufficient documentation

## 2015-08-21 NOTE — Progress Notes (Signed)
GENETIC TEST RESULT  HPI: Ms. Riffel was previously seen in the [facility] due to a personal history of breast cancer, family history of breast and colon cancer, and concerns regarding a hereditary predisposition to cancer. Please refer to our prior cancer genetics clinic note from July 14, 2015 for more information regarding Ms. Lannom's medical, social and family histories, and our assessment and recommendations, at the time. Ms. Riggan recent genetic test results were disclosed to her, as were recommendations warranted by these results. These results and recommendations are discussed in more detail below.  GENETIC TEST RESULTS: At the time of Ms. Giambalvo's visit on 07/14/15, we recommended she pursue genetic testing of the 20-gene Breast/Ovarian Cancer Panel through [facility].  The Breast/Ovarian Cancer Panel offered by [facility] Hope Pigeon, MD) includes sequencing and deletion/duplication analysis for the following 19 genes:  ATM, BARD1, BRCA1, BRCA2, BRIP1, CDH1, CHEK2, FANCC, MLH1, MSH2, MSH6, NBN, PALB2, PMS2, PTEN, RAD51C, RAD51D, TP53, and XRCC2.  This panel also includes deletion/duplication analysis (without sequencing) for one gene, EPCAM.  Those results are now back, the report date for which is July 27, 2015.  Genetic testing was normal, and did not reveal a deleterious mutation in these genes.  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  The test report will be scanned into EPIC and will be located under the Results Review tab in the Pathology>Molecular Pathology section.   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 At this time, it is unknown if this VUS is associated with an increased risk for cancer or if this is a normal finding. Since this VUS result is uncertain, it cannot help guide screening recommendations,  and family members should not be tested for this VUS to help define their own cancer risks.  Also, we all have variants within our genes that make [place] unique individuals--most of these variants are benign.  Thus, we treat this VUS as a negative result.   With time, we suspect the lab will reclassify this variant and when they do, we will try to re-contact Ms. Mende to discuss the reclassification further.  We also encouraged Ms. Stailey to contact us in a year or two to obtain an update on the status of this VUS.  We discussed with Ms. Paulson that since the current genetic testing is not perfect, it is possible there may be a gene mutation in one of these genes that current testing cannot detect, but that chance is small. We also discussed, that it is possible that another gene that has not yet been discovered, or that we have not yet tested, is responsible for the cancer diagnoses in the family, and it is, therefore, important to remain in touch with cancer genetics in the future so that we can continue to offer Ms. Neer the most up to date genetic testing.    CANCER SCREENING RECOMMENDATIONS: This result is reassuring and indicates that Ms. Ferran likely does not have an increased risk for a future cancer due to a mutation in one of these genes. This normal test also suggests that Ms. Mottley's cancer was most likely not due to an inherited predisposition associated with one of these genes.  Most cancers happen by chance and this negative test suggests that her cancer falls into this category.  We, therefore, recommended she continue to follow the cancer management and screening guidelines provided by  her oncology and primary healthcare providers.   RECOMMENDATIONS FOR FAMILY MEMBERS: Women in this family might be at some increased risk of developing cancer, over the general population risk, simply due to the family history of cancer. We recommended women in this family have a yearly  mammogram beginning at age 38, or 12 years younger than the earliest onset of cancer, an an annual clinical breast exam, and perform monthly breast self-exams. Women in this family should also have a gynecological exam as recommended by their primary provider. All family members should have a colonoscopy by age 77.  FOLLOW-UP: Lastly, we discussed with Ms. Kloeppel that cancer genetics is a rapidly advancing field and it is possible that new genetic tests will be appropriate for her and/or her family members in the future. We encouraged her to remain in contact with cancer genetics on an annual basis so we can update her personal and family histories and let her know of advances in cancer genetics that may benefit this family.   Our contact number was provided. Ms. Bellizzi questions were answered to her satisfaction, and she knows she is welcome to call us at anytime with additional questions or concerns.   Jeanine Luz, MS, [facility] Certified Genetic Counselor [email or web address] Phone: [phone]

## 2015-08-24 ENCOUNTER — Other Ambulatory Visit: Payer: Self-pay

## 2015-08-24 ENCOUNTER — Encounter: Payer: Self-pay | Admitting: Hematology and Oncology

## 2015-08-24 ENCOUNTER — Telehealth: Payer: Self-pay | Admitting: Hematology and Oncology

## 2015-08-24 ENCOUNTER — Ambulatory Visit (HOSPITAL_BASED_OUTPATIENT_CLINIC_OR_DEPARTMENT_OTHER): Payer: Medicare Other | Admitting: Hematology and Oncology

## 2015-08-24 VITALS — BP 115/80 | HR 82 | Temp 98.2°F | Resp 18 | Wt 204.1 lb

## 2015-08-24 DIAGNOSIS — C50511 Malignant neoplasm of lower-outer quadrant of right female breast: Secondary | ICD-10-CM

## 2015-08-24 DIAGNOSIS — Z170 Estrogen receptor positive status [ER+]: Secondary | ICD-10-CM | POA: Diagnosis not present

## 2015-08-24 MED ORDER — LIDOCAINE-PRILOCAINE 2.5-2.5 % EX CREA: TOPICAL_CREAM | CUTANEOUS | Status: DC

## 2015-08-24 MED ORDER — ONDANSETRON HCL 8 MG PO TABS: 8.0000 mg | ORAL_TABLET | Freq: Two times a day (BID) | ORAL | Status: DC | PRN

## 2015-08-24 MED ORDER — PROCHLORPERAZINE MALEATE 10 MG PO TABS: 10.0000 mg | ORAL_TABLET | Freq: Four times a day (QID) | ORAL | Status: DC | PRN

## 2015-08-24 MED ORDER — DEXAMETHASONE 4 MG PO TABS: 4.0000 mg | ORAL_TABLET | Freq: Two times a day (BID) | ORAL | Status: DC

## 2015-08-24 MED ORDER — LORAZEPAM 0.5 MG PO TABS: 0.5000 mg | ORAL_TABLET | Freq: Every day | ORAL | Status: DC

## 2015-08-24 NOTE — Assessment & Plan Note (Signed)
Right mastectomy 08/15/2015: IDC grade 2, 2.2 cm, with associated DCIS intermediate grade, separate focus high-grade DCIS, 0/4 lymph nodes negative, T2 N0 stage II a, ER 90%, PR 5%, HER-2 negative duration 1.42, Ki-67 60%  Pathology counseling: I discussed the final pathology report of the patient provided  a copy of this report. I discussed the margins as well as lymph node surgeries. We also discussed the final staging along with previously performed ER/PR and HER-2/neu testing.  Recommendation: 1. Adjuvant chemotherapy with Sharon 2. Followed by adjuvant antiestrogen therapy with anastrozole 5 years No role of radiation since she had mastectomy.  Chemotherapy Counseling: I discussed the risks and benefits of chemotherapy including the risks of nausea/ vomiting, risk of infection from low WBC count, fatigue due to chemo or anemia, bruising or bleeding due to low platelets, mouth sores, loss/ change in taste and decreased appetite. Liver and kidney function will be monitored through out chemotherapy as abnormalities in liver and kidney function may be a side effect of treatment. Cardiac dysfunction due to Herceptinwas discussed in detail. Risk of permanent bone marrow dysfunction and leukemia due to chemo were also discussed.  Return to clinic at the beginning of chemotherapy.

## 2015-08-24 NOTE — Progress Notes (Signed)
Patient Care Team: Midge Minium, MD as PCP - General [name], MD as Consulting Physician (Cardiology) Marylynn Pearson, MD as Consulting Physician (Ophthalmology) Nicholas Lose, MD as Consulting Physician (Hematology and Oncology) Kyung Rudd, MD as Consulting Physician (Radiation Oncology) Fanny Skates, MD as Consulting Physician (General Surgery)  DIAGNOSIS: Breast cancer of lower-outer quadrant of right female breast [facility])   Staging form: Breast, AJCC 7th Edition     Clinical stage from 07/13/2015: Stage IA (T1c, N0, M0) - Unsigned   SUMMARY OF ONCOLOGIC HISTORY:   Breast cancer of lower-outer quadrant of right female breast ([place])   07/06/2015 Initial Diagnosis Right breast biopsy posterior: IDC ER 90%, PR 5%, Ki-67 60%, HER-2 positive ratio 1.42,copy #6.1 T1c N0 stage IA; Right breast biopsy inferior medial: High-grade DCIS with comedonecrosis; ER 100%, PR 90%; 5 mm calcs   08/15/2015 Surgery Right mastectomy: IDC grade 2, 2.2 cm, with associated DCIS intermediate grade, separate focus high-grade DCIS, 0/4 lymph nodes negative, T2 N0 stage II a, ER 90%, PR 5%, HER-2 negative duration 1.42, Ki-67 60%    CHIEF COMPLIANT: follow-up after recent mastectomy  INTERVAL HISTORY: Tracey Evans is a 70 year old with above-mentioned history of right breast cancer who underwent mastectomy and is here today to discuss the pathology report.She complains of phantom pains and discomfort in the right breast area. She currently has a drain and has slight difficulty underneath the arm.Otherwise she done quite well from surgery standpoint. She still feels occasional fatigue.  REVIEW OF SYSTEMS:   Constitutional: Denies fevers, chills or abnormal weight loss Eyes: Denies blurriness of vision Ears, nose, mouth, throat, and face: Denies mucositis or sore throat Respiratory: Denies cough, dyspnea or wheezes Cardiovascular: Denies palpitation, chest discomfort Gastrointestinal:  Denies nausea,  heartburn or change in bowel habits Skin: Denies abnormal skin rashes Lymphatics: Denies new lymphadenopathy or easy bruising Neurological:Denies numbness, tingling or new weaknesses Behavioral/Psych: Mood is stable, no new changes  Extremities: No lower extremity edema Breast: recent right mastectomy All other systems were reviewed with the patient and are negative.  I have reviewed the past medical history, past surgical history, social history and family history with the patient and they are unchanged from previous note.  ALLERGIES:  is allergic to contrast media; dilaudid; adhesive; codeine; and lactose intolerance (gi).  MEDICATIONS:  Current Outpatient Prescriptions  Medication Sig Dispense Refill  . albuterol (PROAIR HFA) 108 (90 BASE) MCG/ACT inhaler Inhale 2 puffs into the lungs every 6 (six) hours as needed for wheezing. 1 Inhaler 3  . ALPRAZolam (XANAX) 1 MG tablet TAKE 1 TABLET BY MOUTH TWICE DAILY AS NEEDED FOR ANXIETY 60 tablet 3  . amLODipine-valsartan (EXFORGE) 5-160 MG tablet Take 1 tablet by mouth daily. 90 tablet 1  . anastrozole (ARIMIDEX) 1 MG tablet Take 1 tablet (1 mg total) by mouth daily. 30 tablet 0  . aspirin 81 MG tablet Take 81 mg by mouth daily.    Marland Kitchen atorvastatin (LIPITOR) 20 MG tablet TAKE 1 TABLET BY MOUTH EVERY DAY 30 tablet 6  . buPROPion (WELLBUTRIN XL) 300 MG 24 hr tablet TAKE 1 TABLET BY MOUTH EVERY DAY 30 tablet 3  . clonazePAM (KLONOPIN) 0.5 MG tablet TAKE 1 TABLET BY MOUTH AT BEDTIME AS NEEDED (Patient taking differently: TAKE 1 TABLET BY MOUTH AT BEDTIME AS NEEDED for sleep) 30 tablet 0  . cyclobenzaprine (FLEXERIL) 5 MG tablet Take 5 mg by mouth daily as needed for muscle spasms.     Marland Kitchen levothyroxine (SYNTHROID, LEVOTHROID) 75  MCG tablet TAKE 1 TABLET BY MOUTH DAILY. 30 tablet 6  . meclizine (ANTIVERT) 25 MG tablet Take 25 mg by mouth daily as needed for nausea.     . meloxicam (MOBIC) 15 MG tablet Take 15 mg by mouth daily.     . nitroGLYCERIN  (NITROSTAT) 0.4 MG SL tablet Place 1 tablet (0.4 mg total) under the tongue every 5 (five) minutes as needed. As needed for chest pain 30 tablet 3  . Polyethyl Glycol-Propyl Glycol (SYSTANE ULTRA) 0.4-0.3 % SOLN Place 1 drop into both eyes 3 (three) times daily as needed.    . promethazine (PHENERGAN) 12.5 MG tablet Take 12.5 mg by mouth daily as needed for nausea.   0  . ranitidine (ZANTAC) 150 MG tablet Take 150 mg by mouth 2 (two) times daily as needed for heartburn.     Marland Kitchen tiZANidine (ZANAFLEX) 4 MG tablet Take 4 mg by mouth every 6 (six) hours as needed for muscle spasms.    . traMADol (ULTRAM) 50 MG tablet Take 50 mg by mouth daily as needed for moderate pain.   0   No current facility-administered medications for this visit.    PHYSICAL EXAMINATION: ECOG PERFORMANCE STATUS: 1 - Symptomatic but completely ambulatory  Filed Vitals:   08/24/15 0920  BP: 115/80  Pulse: 82  Temp: 98.2 F (36.8 C)  Resp: 18   Filed Weights   08/24/15 0920  Weight: 204 lb 1.6 oz (92.579 kg)    GENERAL:alert, no distress and comfortable SKIN: skin color, texture, turgor are normal, no rashes or significant lesions EYES: normal, Conjunctiva are pink and non-injected, sclera clear OROPHARYNX:no exudate, no erythema and lips, buccal mucosa, and tongue normal  NECK: supple, thyroid normal size, non-tender, without nodularity LYMPH:  no palpable lymphadenopathy in the cervical, axillary or inguinal LUNGS: clear to auscultation and percussion with normal breathing effort HEART: regular rate & rhythm and no murmurs and no lower extremity edema ABDOMEN:abdomen soft, non-tender and normal bowel sounds MUSCULOSKELETAL:no cyanosis of digits and no clubbing  NEURO: alert & oriented x 3 with fluent speech, no focal motor/sensory deficits EXTREMITIES: No lower extremity edema   LABORATORY DATA:  I have reviewed the data as listed   Chemistry      Component Value Date/Time   NA 143 08/10/2015 0952   NA  144 07/13/2015 0838   K 3.1* 08/10/2015 0952   K 3.7 07/13/2015 0838   CL 107 08/10/2015 0952   CO2 27 08/10/2015 0952   CO2 30* 07/13/2015 0838   BUN 8 08/10/2015 0952   BUN 10.3 07/13/2015 0838   CREATININE 0.91 08/10/2015 0952   CREATININE 1.0 07/13/2015 0838   CREATININE 0.79 05/21/2014 1610      Component Value Date/Time   CALCIUM 9.6 08/10/2015 0952   CALCIUM 9.4 07/13/2015 0838   ALKPHOS 86 08/10/2015 0952   ALKPHOS 89 07/13/2015 0838   AST 25 08/10/2015 0952   AST 23 07/13/2015 0838   ALT 24 08/10/2015 0952   ALT 23 07/13/2015 0838   BILITOT 0.8 08/10/2015 0952   BILITOT 0.94 07/13/2015 0838       Lab Results  Component Value Date   WBC 8.7 08/10/2015   HGB 13.4 08/10/2015   HCT 40.4 08/10/2015   MCV 90.6 08/10/2015   PLT 317 08/10/2015   NEUTROABS 4.4 08/10/2015     ASSESSMENT & PLAN:  Breast cancer of lower-outer quadrant of right female breast ([place]) Right mastectomy 08/15/2015: IDC grade 2, 2.2 cm, with  associated DCIS intermediate grade, separate focus high-grade DCIS, 0/4 lymph nodes negative, T2 N0 stage II a, ER 90%, PR 5%, HER-2 negative duration 1.42, Ki-67 60%  Pathology counseling: I discussed the final pathology report of the patient provided  a copy of this report. I discussed the margins as well as lymph node surgeries. We also discussed the final staging along with previously performed ER/PR and HER-2/neu testing.  Recommendation: 1. Adjuvant chemotherapy with [place] 2. Followed by adjuvant antiestrogen therapy with anastrozole 5 years No role of radiation since she had mastectomy.  Chemotherapy Counseling: I discussed the risks and benefits of chemotherapy including the risks of nausea/ vomiting, risk of infection from low WBC count, fatigue due to chemo or anemia, bruising or bleeding due to low platelets, mouth sores, loss/ change in taste and decreased appetite. Liver and kidney function will be monitored through out chemotherapy as  abnormalities in liver and kidney function may be a side effect of treatment. Cardiac dysfunction due to Herceptinwas discussed in detail. Risk of permanent bone marrow dysfunction and leukemia due to chemo were also discussed.  Return to clinic at the beginning of chemotherapy.  Start chemotherapy 09/13/2015   No orders of the defined types were placed in this encounter.   The patient has a good understanding of the overall plan. she agrees with it. she will call with any problems that may develop before the next visit here.   Rulon Eisenmenger, MD 08/24/2015

## 2015-08-24 NOTE — Telephone Encounter (Signed)
Gave and printed appt sched and avs for pt for June and July  °

## 2015-08-25 ENCOUNTER — Ambulatory Visit (HOSPITAL_BASED_OUTPATIENT_CLINIC_OR_DEPARTMENT_OTHER)
Admission: RE | Admit: 2015-08-25 | Discharge: 2015-08-25 | Disposition: A | Discharge status: Home / Self Care | Payer: Medicare Other | Source: Ambulatory Visit | Attending: Internal Medicine | Admitting: Internal Medicine

## 2015-08-25 ENCOUNTER — Encounter (HOSPITAL_COMMUNITY): Payer: Self-pay | Admitting: Internal Medicine

## 2015-08-25 ENCOUNTER — Ambulatory Visit (HOSPITAL_COMMUNITY)
Admission: RE | Admit: 2015-08-25 | Discharge: 2015-08-25 | Disposition: A | Discharge status: Home / Self Care | Payer: Medicare Other | Source: Ambulatory Visit | Attending: Internal Medicine | Admitting: Internal Medicine

## 2015-08-25 ENCOUNTER — Other Ambulatory Visit: Payer: Self-pay | Admitting: *Deleted

## 2015-08-25 VITALS — BP 112/68 | HR 67 | Wt 204.5 lb

## 2015-08-25 DIAGNOSIS — F329 Major depressive disorder, single episode, unspecified: Secondary | ICD-10-CM | POA: Diagnosis not present

## 2015-08-25 DIAGNOSIS — J45909 Unspecified asthma, uncomplicated: Secondary | ICD-10-CM | POA: Diagnosis not present

## 2015-08-25 DIAGNOSIS — C50512 Malignant neoplasm of lower-outer quadrant of left female breast: Secondary | ICD-10-CM

## 2015-08-25 DIAGNOSIS — M797 Fibromyalgia: Secondary | ICD-10-CM | POA: Diagnosis not present

## 2015-08-25 DIAGNOSIS — C50511 Malignant neoplasm of lower-outer quadrant of right female breast: Secondary | ICD-10-CM | POA: Diagnosis not present

## 2015-08-25 DIAGNOSIS — Z79811 Long term (current) use of aromatase inhibitors: Secondary | ICD-10-CM | POA: Diagnosis not present

## 2015-08-25 DIAGNOSIS — E039 Hypothyroidism, unspecified: Secondary | ICD-10-CM | POA: Diagnosis not present

## 2015-08-25 DIAGNOSIS — E785 Hyperlipidemia, unspecified: Secondary | ICD-10-CM | POA: Insufficient documentation

## 2015-08-25 DIAGNOSIS — Z885 Allergy status to narcotic agent status: Secondary | ICD-10-CM | POA: Insufficient documentation

## 2015-08-25 DIAGNOSIS — Z9011 Acquired absence of right breast and nipple: Secondary | ICD-10-CM | POA: Diagnosis not present

## 2015-08-25 DIAGNOSIS — Z91041 Radiographic dye allergy status: Secondary | ICD-10-CM | POA: Insufficient documentation

## 2015-08-25 DIAGNOSIS — Z79899 Other long term (current) drug therapy: Secondary | ICD-10-CM | POA: Insufficient documentation

## 2015-08-25 DIAGNOSIS — E119 Type 2 diabetes mellitus without complications: Secondary | ICD-10-CM | POA: Insufficient documentation

## 2015-08-25 DIAGNOSIS — Z7982 Long term (current) use of aspirin: Secondary | ICD-10-CM | POA: Insufficient documentation

## 2015-08-25 DIAGNOSIS — Z803 Family history of malignant neoplasm of breast: Secondary | ICD-10-CM | POA: Diagnosis not present

## 2015-08-25 DIAGNOSIS — K219 Gastro-esophageal reflux disease without esophagitis: Secondary | ICD-10-CM | POA: Insufficient documentation

## 2015-08-25 DIAGNOSIS — I10 Essential (primary) hypertension: Secondary | ICD-10-CM | POA: Diagnosis not present

## 2015-08-25 DIAGNOSIS — F419 Anxiety disorder, unspecified: Secondary | ICD-10-CM | POA: Diagnosis not present

## 2015-08-25 NOTE — Progress Notes (Signed)
Patient ID: Tracey Evans, female   DOB: October 27, 1945, 70 y.o.   MRN: [id number]   Tracey Evans NOTE  Referring Physician: Lindi Adie Primary Care: Birdie Riddle Cardiologist: Terrence Dupont   HPI:  70 y/o woman with h/o obesity, GERD HTN, DM2 and HL referred by Dr. Lindi Adie for enrollment into cardio-oncology clinic.      Breast cancer of lower-outer quadrant of right female breast (Tracey Evans)   07/06/2015 Initial Diagnosis Right breast biopsy posterior: IDC ER 90%, PR 5%, Ki-67 60%, HER-2 positive ratio 1.42,copy #6.1 T1c N0 stage IA; Right breast biopsy inferior medial: High-grade DCIS with comedonecrosis; ER 100%, PR 90%; 5 mm calcs   08/15/2015 Surgery Right mastectomy: IDC grade 2, 2.2 cm, with associated DCIS intermediate grade, separate focus high-grade DCIS, 0/4 lymph nodes negative, T2 N0 stage II a, ER 90%, PR 5%, HER-2 negative duration 1.42, Ki-67 60%        Reports h/o chronic angina. Has had 3 caths with Dr. 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 Does ADLs without problem No HF symptoms. Will receive adjuvant TCH starting on June 6 followed by anastrazole x 5 years. + snoring anf fatigue.   Echo 08/25/15: 60-65% lateral s' 10.6 cm/s  GLS -20.4%    Review of Systems: [y] = yes, '[ ]'  = no   General: Weight gain '[ ]' ; Weight loss '[ ]' ; Anorexia '[ ]' ; Fatigue Blue.Reese ]; Fever '[ ]' ; Chills '[ ]' ; Weakness '[ ]'   Cardiac: Chest pain/pressure Blue.Reese ]; Resting SOB '[ ]' ; Exertional SOB '[ ]' ; Orthopnea '[ ]' ; Pedal Edema '[ ]' ; Palpitations '[ ]' ; Syncope '[ ]' ; Presyncope '[ ]' ; Paroxysmal nocturnal dyspnea'[ ]'   Pulmonary: Cough '[ ]' ; Wheezing'[ ]' ; Hemoptysis'[ ]' ; Sputum '[ ]' ; Snoring '[ ]'   GI: Vomiting'[ ]' ; Dysphagia'[ ]' ; Melena'[ ]' ; Hematochezia '[ ]' ; Heartburn'[ ]' ; Abdominal pain '[ ]' ; Constipation '[ ]' ; Diarrhea '[ ]' ; BRBPR '[ ]'   GU: Hematuria'[ ]' ; Dysuria '[ ]' ; Nocturia'[ ]'   Vascular: Pain in legs with walking '[ ]' ; Pain in feet with lying flat '[ ]' ; Non-healing sores '[ ]' ; Stroke '[ ]' ; TIA '[ ]' ;  Slurred speech '[ ]' ;  Neuro: Headaches'[ ]' ; Vertigo'[ ]' ; Seizures'[ ]' ; Paresthesias'[ ]' ;Blurred vision '[ ]' ; Diplopia '[ ]' ; Vision changes '[ ]'   Ortho/Skin: Arthritis Blue.Reese ]; Joint pain Blue.Reese ]; Muscle pain '[ ]' ; Joint swelling '[ ]' ; Back Pain '[ ]' ; Rash '[ ]'   Psych: Depression'[ ]' ; Anxiety'[ ]'   Heme: Bleeding problems '[ ]' ; Clotting disorders '[ ]' ; Anemia '[ ]'   Endocrine: Diabetes [ y]; Thyroid dysfunction[ y]   Past Medical History  Diagnosis Date  . GERD (gastroesophageal reflux disease)   . Hyperlipidemia   . Hypertension   . Depression   . Fibromyalgia   . Peptic ulcer disease   . Hypothyroidism   . Chronic lower back pain   . Asthma   . Diverticular disease   . Binge eating disorder   . Costochondritis   . Anxiety   . Hot flashes   . Shortness of breath dyspnea   . Complication of anesthesia     had bronc spasms during intubation surgery 2009 on foot-need albuterol inhaler or neb tx preop  . Angina at rest Tracey Evans)     "occurs whenever it wants to, but worse during agitation"  . Chronic bronchitis (Tracey Evans)   . Type II diabetes mellitus (Tracey Evans)     "diet controlled" (08/15/2015)  . Osteoarthritis     "all over"  . Breast cancer of lower-outer quadrant  of left female breast (Tracey Evans) 07/08/2015    "NEVER HAD LEFT BREAST CANCER" (08/15/2015)  . Cancer of right breast (Tracey Evans) 08/15/2015    Current Outpatient Prescriptions  Medication Sig Dispense Refill  . albuterol (PROAIR HFA) 108 (90 BASE) MCG/ACT inhaler Inhale 2 puffs into the lungs every 6 (six) hours as needed for wheezing. 1 Inhaler 3  . ALPRAZolam (XANAX) 1 MG tablet TAKE 1 TABLET BY MOUTH TWICE DAILY AS NEEDED FOR ANXIETY 60 tablet 3  . amLODipine-valsartan (EXFORGE) 5-160 MG tablet Take 1 tablet by mouth daily. 90 tablet 1  . anastrozole (ARIMIDEX) 1 MG tablet Take 1 tablet (1 mg total) by mouth daily. 30 tablet 0  . aspirin 81 MG tablet Take 81 mg by mouth daily.    Marland Kitchen atorvastatin (LIPITOR) 20 MG tablet TAKE 1 TABLET BY MOUTH EVERY DAY 30 tablet 6    . buPROPion (WELLBUTRIN XL) 300 MG 24 hr tablet TAKE 1 TABLET BY MOUTH EVERY DAY 30 tablet 3  . cyclobenzaprine (FLEXERIL) 5 MG tablet Take 5 mg by mouth daily as needed for muscle spasms.     Marland Kitchen levothyroxine (SYNTHROID, LEVOTHROID) 75 MCG tablet TAKE 1 TABLET BY MOUTH DAILY. 30 tablet 6  . meclizine (ANTIVERT) 25 MG tablet Take 25 mg by mouth daily as needed for nausea.     . meloxicam (MOBIC) 15 MG tablet Take 15 mg by mouth daily.     Vladimir Faster Glycol-Propyl Glycol (SYSTANE ULTRA) 0.4-0.3 % SOLN Place 1 drop into both eyes 3 (three) times daily as needed.    . ranitidine (ZANTAC) 150 MG tablet Take 150 mg by mouth 2 (two) times daily as needed for heartburn.     Marland Kitchen tiZANidine (ZANAFLEX) 4 MG tablet Take 4 mg by mouth every 6 (six) hours as needed for muscle spasms.    . traMADol (ULTRAM) 50 MG tablet Take 50 mg by mouth daily as needed for moderate pain.   0  . dexamethasone (DECADRON) 4 MG tablet Take 1 tablet (4 mg total) by mouth 2 (two) times daily. Start the day before Taxotere. Then again the day after chemo for 1 day. (Patient not taking: Reported on 08/25/2015) 30 tablet 0  . lidocaine-prilocaine (EMLA) cream Apply to affected area once (Patient not taking: Reported on 08/25/2015) 30 g 3  . LORazepam (ATIVAN) 0.5 MG tablet Take 1 tablet (0.5 mg total) by mouth at bedtime. (Patient not taking: Reported on 08/25/2015) 30 tablet 0  . nitroGLYCERIN (NITROSTAT) 0.4 MG SL tablet Place 1 tablet (0.4 mg total) under the tongue every 5 (five) minutes as needed. As needed for chest pain (Patient not taking: Reported on 08/25/2015) 30 tablet 3  . ondansetron (ZOFRAN) 8 MG tablet Take 1 tablet (8 mg total) by mouth 2 (two) times daily as needed for refractory nausea / vomiting. Start on day 3 after chemo. (Patient not taking: Reported on 08/25/2015) 30 tablet 1  . prochlorperazine (COMPAZINE) 10 MG tablet Take 1 tablet (10 mg total) by mouth every 6 (six) hours as needed (Nausea or vomiting). (Patient not  taking: Reported on 08/25/2015) 30 tablet 1  . promethazine (PHENERGAN) 12.5 MG tablet Take 12.5 mg by mouth daily as needed for nausea. Reported on 08/25/2015  0   No current facility-administered medications for this encounter.    Allergies  Allergen Reactions  . Contrast Media [Iodinated Diagnostic Agents] Shortness Of Breath    Patient has SOB, tightness in chest and feels like an elephant is sitting on chest,  patient should be  scanned at hospital Per Dr. Alvester Chou  . Dilaudid [Hydromorphone Hcl] Anaphylaxis    Pt stopped breathing  . Adhesive [Tape]     blister  . Codeine Nausea Only    insomnia  . Lactose Intolerance (Gi) Diarrhea      Social History   Social History  . Marital Status: Divorced    Spouse Name: N/A  . Number of Children: N/A  . Years of Education: N/A   Occupational History  . Not on file.   Social History Main Topics  . Smoking status: Never Smoker   . Smokeless tobacco: Never Used  . Alcohol Use: 0.0 oz/week    0 Standard drinks or equivalent per week     Comment: 08/15/2015 "1/2 glass of wine q 2 months"   . Drug Use: No  . Sexual Activity: Not Currently   Other Topics Concern  . Not on file   Social History Narrative      Family History  Problem Relation Age of Onset  . Dementia Mother   . Stroke Mother   . Heart Problems Mother   . Dementia Father   . Alcohol abuse Father   . Colon cancer Father     dx. 64 or younger  . Breast cancer Sister 20    inflammatory  . Diverticulitis Sister     maternal half-sister; severe - causing partial colectomy  . Breast cancer Sister     maternal half-sister; dx. early 73s  . Breast cancer Other 82    niece  . Breast cancer Other     niece dx. 71s  . Alcohol abuse Brother     Filed Vitals:   08/25/15 1008  BP: 112/68  Pulse: 67  Weight: 204 lb 8 oz (92.761 kg)  SpO2: 100%    PHYSICAL EXAM: General:  Well appearing. No respiratory difficulty HEENT: normal Neck: supple. no JVD. Carotids  2+ bilat; no bruits. No lymphadenopathy or thryomegaly appreciated. Cor: PMI nondisplaced. Regular rate & rhythm. No rubs, gallops or murmurs. Lungs: clear Abdomen: obese soft, nontender, nondistended. No hepatosplenomegaly. No bruits or masses. Good bowel sounds. Extremities: no cyanosis, clubbing, rash, mild edema onR Neuro: alert & oriented x 3, cranial nerves grossly intact. moves all 4 extremities w/o difficulty. Affect pleasant.   ASSESSMENT & PLAN: 1. Breast cancer of lower-outer quadrant of right female breast [facility]) Right mastectomy 08/15/2015: IDC grade 2, 2.2 cm, with associated DCIS intermediate grade, separate focus high-grade DCIS, 0/4 lymph nodes negative, T2 N0 stage II a, ER 90%, PR 5%, HER-2 negative duration 1.42, Ki-67 60%     1. Adjuvant chemotherapy with [place]     2. Followed by adjuvant antiestrogen therapy with anastrozole 5 years      Explained incidence of Herceptin cardiotoxicity and role of Cardio-oncology clinic at length. Echo images reviewed personally. All parameters stable. Reviewed signs and symptoms of HF to look for. Ok to startHerceptin. Follow-up with echo in 3 months. 2. Snoring and fatigue    --consider sleep study.   Bensimhon, Daniel,MD 10:47 AM

## 2015-08-25 NOTE — Progress Notes (Signed)
*  PRELIMINARY RESULTS* Echocardiogram 2D Echocardiogram has been performed.  Tracey Evans 08/25/2015, 10:06 AM

## 2015-08-25 NOTE — Patient Instructions (Signed)
Your physician recommends that you schedule a follow-up appointment in: 3 months with echocardiogram  

## 2015-08-25 NOTE — Addendum Note (Signed)
Encounter addended by: Scarlette Calico, RN on: 08/25/2015 11:01 AM<BR>     Documentation filed: Dx Association, Patient Instructions Section, Orders

## 2015-08-30 ENCOUNTER — Telehealth: Payer: Self-pay | Admitting: *Deleted

## 2015-08-30 NOTE — Telephone Encounter (Signed)
  Oncology Nurse Navigator Documentation    Navigator Encounter Type: Telephone (08/30/15 1100) Telephone: Dolliver Call (08/30/15 1100)     Surgery Date: 08/15/15 (08/30/15 1100) Treatment Initiated Date: 08/15/15 (08/30/15 1100)         Interventions: Referrals (08/30/15 1100) Referrals: Nutrition/dietician (08/30/15 1100)                    Time Spent with Patient: 15 (08/30/15 1100)

## 2015-08-31 ENCOUNTER — Ambulatory Visit: Payer: Medicare Other | Attending: General Surgery | Admitting: Physical Therapy

## 2015-08-31 DIAGNOSIS — R222 Localized swelling, mass and lump, trunk: Secondary | ICD-10-CM | POA: Diagnosis not present

## 2015-08-31 DIAGNOSIS — Z483 Aftercare following surgery for neoplasm: Secondary | ICD-10-CM | POA: Insufficient documentation

## 2015-08-31 DIAGNOSIS — M25511 Pain in right shoulder: Secondary | ICD-10-CM | POA: Insufficient documentation

## 2015-08-31 DIAGNOSIS — M25611 Stiffness of right shoulder, not elsewhere classified: Secondary | ICD-10-CM | POA: Diagnosis not present

## 2015-08-31 NOTE — Therapy (Signed)
[place], [place], [address] Phone: [phone]   Fax:  [phone]  Physical Therapy Evaluation  Patient Details  Name: Tracey Evans MRN: [id number] Date of Birth: [birth year] Referring Provider: Dr. Brion Aliment  Encounter Date: 08/31/2015      PT End of Session - 08/31/15 0919    Visit Number 1   Number of Visits 13   PT Start Time 0815   PT Stop Time 0905   PT Time Calculation (min) 50 min   Activity Tolerance Patient tolerated treatment well   Behavior During Therapy [facility] for tasks assessed/performed      Past Medical History  Diagnosis Date  . GERD (gastroesophageal reflux disease)   . Hyperlipidemia   . Hypertension   . Depression   . Fibromyalgia   . Peptic ulcer disease   . Hypothyroidism   . Chronic lower back pain   . Asthma   . Diverticular disease   . Binge eating disorder   . Costochondritis   . Anxiety   . Hot flashes   . Shortness of breath dyspnea   . Complication of anesthesia     had bronc spasms during intubation surgery 2009 on foot-need albuterol inhaler or neb tx preop  . Angina at rest [facility])     "occurs whenever it wants to, but worse during agitation"  . Chronic bronchitis ([place])   . Type II diabetes mellitus ([place])     "diet controlled" (08/15/2015)  . Osteoarthritis     "all over"  . Breast cancer of lower-outer quadrant of left female breast ([place]) 07/08/2015    "NEVER HAD LEFT BREAST CANCER" (08/15/2015)  . Cancer of right breast ([place]) 08/15/2015    Past Surgical History  Procedure Laterality Date  . Tonsillectomy    . Shoulder arthroscopy Bilateral 2009-2011    "bone spurs"  . Colonoscopy    . Ankle fracture surgery Right 2009    Dr. Rolena Infante  . Ankle arthroscopy  01/02/2012    Procedure: ANKLE ARTHROSCOPY;  Surgeon: Colin Rhein, MD;  Location: [place];  Service: Orthopedics;  Laterality: Right;  right ankle arthroscopy with  extensive debridement , dridement and drilling talar dome osteochondral lesion  . Knee arthroscopy Bilateral   . Carpal tunnel release Bilateral   . Cardiac catheterization    . Mastectomy complete / simple w/ sentinel node biopsy Right 08/15/2015  . Laparoscopic cholecystectomy    . Fracture surgery    . Dilation and curettage of uterus      "before hysterectomy"  . Abdominal hysterectomy      "left my ovaries"  . Portacath placement Left 08/15/2015  . Breast biopsy  07/2015  . Mastectomy w/ sentinel node biopsy Right 08/15/2015    Procedure: TOTAL MASTECTOMY WITH SENTINEL LYMPH NODE BIOPSY AND BLUE DYE INJECTION ;  Surgeon: Fanny Skates, MD;  Location: [place];  Service: General;  Laterality: Right;  . Portacath placement Left 08/15/2015    Procedure: INSERTION PORT-A-CATH ;  Surgeon: Fanny Skates, MD;  Location: [place];  Service: General;  Laterality: Left;    There were no vitals filed for this visit.       Subjective Assessment - 08/31/15 0824    Subjective Hasn't gotten 2nd drain out but has been told to move her arm; understood that this appointment was for evaluation only, not for therapy, and wants to do that.   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  Took Arimidex until day of surgery on May 8th, 2017 and portacath was placed at left chest along with right mastectomy.  Has had one drain removed and one is left.  Fibromyalgia diagnosed 20 years ago and says she manages it.  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  Has had arthroscopic debridement of both shoulders 2010.  Both knee arthroscopic surgery in the past; fracture of right ankle about 2011 with ORIF, subsequently removed.  HTN controlled with  meds.  Angina and usually remembers her nitroglycerin, but says it is controlled.   Patient Stated Goals check ROM and get arm back to normal   Currently in Pain? Yes   Pain Score 9    Pain Location Axilla   Pain Orientation Right   Pain Descriptors / Indicators Other (Comment)  sticky, stinging feeling; nerve pain   Aggravating Factors  certain movements (abduction)   Pain Relieving Factors not moving it, but keeping it still gets it stiff            [facility] PT Assessment - 08/31/15 0001    Assessment   Medical Diagnosis Right breast cancer, s/p mastectomy  no reconstruction planned   Referring Provider Dr. Brion Aliment   Onset Date/Surgical Date 07/07/15   Hand Dominance Right   Precautions   Precautions Other (comment)   Precaution Comments cancer precautions; drain not removed as of 08/31/15   Restrictions   Weight Bearing Restrictions No   Balance Screen   Has the patient fallen in the past 6 months No   Has the patient had a decrease in activity level because of a fear of falling?  No   Is the patient reluctant to leave their home because of a fear of falling?  No   Home Environment   Living Environment Private residence   Living Arrangements Alone   Available Help at Discharge --  oldest daughter is with her today   Type of [place] One level   Prior Function   Level of [place] Retired   Leisure She does not exercise  has started walking daily x 15 mins.   Cognition   Overall Cognitive Status Within Functional Limits for tasks assessed   Observation/Other Assessments   Observations Dog ear at lateral incision is large and feels firm to the touch currently.  Pt. points out that her right abdomen is also swollen.   Skin Integrity Incision healing well, but still with steri strips in place; it is an approx. 10-inch long right chest incision.  Portacath site has glue on it.   Quick DASH  25   AROM   Right Shoulder  Extension 60 Degrees   Right Shoulder Flexion 97 Degrees   Right Shoulder ABduction 85 Degrees  no cording visible or palpable   Right Shoulder Internal Rotation 63 Degrees  in sitting   Right Shoulder External Rotation 90 Degrees  in sitting   Palpation   Palpation comment Fullness and firmness at right lateral mastectomy incision.           LYMPHEDEMA/ONCOLOGY QUESTIONNAIRE - 08/31/15 [id number]    Type   Cancer Type Right breast cancer   Surgeries  Mastectomy Date 08/15/15   Number Lymph Nodes Removed 4  all clear   Treatment   Active Chemotherapy Treatment Yes   Date 09/13/15  that is planned start date   Active Radiation Treatment No   Right Upper Extremity Lymphedema   10 cm Proximal to Olecranon Process 30.8 cm   Olecranon Process 25.7 cm   10 cm Proximal to Ulnar Styloid Process 21.4 cm   Just Proximal to Ulnar Styloid Process 15.7 cm   Across Hand at [employer] 19.5 cm   At [place] of 2nd Digit 6.5 cm   Left Upper Extremity Lymphedema   10 cm Proximal to Olecranon Process 31.3 cm   Olecranon Process 25.2 cm   10 cm Proximal to Ulnar Styloid Process 21.5 cm   Just Proximal to Ulnar Styloid Process 15.3 cm   Across Hand at [employer] 19 cm   At [place] of 2nd Digit 6.3 cm           Quick Dash - 08/31/15 0001    Open a tight or new jar No difficulty   Do heavy household chores (wash walls, wash floors) Mild difficulty   Carry a shopping bag or briefcase Mild difficulty   Wash your back Moderate difficulty   Use a knife to cut food No difficulty   Recreational activities in which you take some force or impact through your arm, shoulder, or hand (golf, hammering, tennis) Unable   During the past week, to what extent has your arm, shoulder or hand problem interfered with your normal social activities with family, friends, neighbors, or groups? Not at all   During the past week, to what extent has your arm, shoulder or hand problem limited your work or other  regular daily activities Slightly   Arm, shoulder, or hand pain. None   Tingling (pins and needles) in your arm, shoulder, or hand Moderate   Difficulty Sleeping No difficulty   DASH Score 25 %                     PT Education - 08/31/15 0918    Education provided Yes   Education Details To continue doing only what Dr. Dalbert Batman told her to do, to be patient and not to push ROM; to return for therapy after 2nd drain is removed.   Person(s) Educated Patient;Child(ren)   Methods Explanation   Comprehension Verbalized understanding           Short Term Clinic Goals - 08/31/15 0935    CC Short Term Goal  #1   Title Independent in a home exercise program for right shoulder ROM and in safe self-progression.   Time 3   Period Weeks   Status New   CC Short Term Goal  #2   Title Right shoulder flexion to at least 115 for improved overhead reach.   Baseline 97 at eval; 131 prior to surgery   Time 3   Period Weeks   Status New   CC Short Term Goal  #3   Title Right shoulder abduction to at least 110 degrees for improved ADLs   Baseline 85 at eval, 150 prior to surgery   Time 3   Period [place] [facility] Goals - 07/13/15 1339    Patient will be able to verbalize understanding of pertinent lymphedema risk reduction practices relevant to her diagnosis specifically related to skin care.  Time 1   Period Days   Status Achieved   Patient will be able to return demonstrate and/or verbalize understanding of the post-op home exercise program related to regaining shoulder range of motion.   Time 1   Period Days   Status Achieved   Patient will be able to verbalize understanding of the importance of attending the postoperative After Breast Cancer Class for further lymphedema risk reduction education and therapeutic exercise.   Time 1   Period Days   Status Achieved          Long Term Clinic Goals - 08/31/15 [id number]    CC Long Term Goal  #1    Title Right shoulder active flexion to at least 125 degrees for improved overhead reach.   Baseline 97 at eval, 131 prior to surgery   Time 6   Period Weeks   Status New   CC Long Term Goal  #2   Title right shoulder active abduction to at least 140 degrees   Baseline 85 at eval, 150 prior to surgery   Time 6   Period Weeks   Status New   CC Long Term Goal  #3   Title Right shoulder pain on movement decreased at least 50%.   Time 6   Period Weeks   Status New            Plan - 08/31/15 0919    Clinical Impression Statement Patient is s/p right mastectomy done on 08/15/15 and still has one drain in place, which she expects to come out on next visit with surgeon on 09/06/15.  Only AROM was checked today, as patient reported that Dr. Dalbert Batman told her to move her arm, but that therapy should wait until 2nd drain is removed. She shows moderate limitations in right shoulder active flexion and abduction; her incision is healing well.  She has some fullness at lateral aspect of her rather long mastectomy incision; she also notes fullness in her abdomen on the right.  No cording was seen.  Strength was not checked.  Circumference measurements were taken of both arms and these are close to what they were prior to surgery.  She has a complicated medical history including fibromyalgia, h/o bilateral shoulder debridements and knee arthroscopy.   Rehab Potential Excellent   Clinical Impairments Affecting Rehab Potential Surgery still recent (08/15/15); will start chem 09/13/15 or so.   PT Frequency 2x / week   PT Duration 6 weeks   PT Treatment/Interventions ADLs/Self Care Home Management;Therapeutic exercise;Patient/family education;Manual techniques;Manual lymph drainage;Scar mobilization;Passive range of motion   PT Next Visit Plan Pt. to return after 2nd drain is removed.  Begin P/AA/A/ROM.  Consider manual lymph drainage to right flank area of swelling at lateral incision.  Progress HEP.  Suggest ABC  class.  Six weeks post surgery, consider scar mobilization.   PT Home Exercise Plan Do only what Dr. Dalbert Batman told her to do.   Consulted and Agree with Plan of Care Patient      Patient will benefit from skilled therapeutic intervention in order to improve the following deficits and impairments:  Decreased knowledge of precautions, Decreased range of motion, Increased edema, Decreased scar mobility, Impaired UE functional use  Visit Diagnosis: Aftercare following surgery for neoplasm - Plan: PT plan of care cert/re-cert  Stiffness of right shoulder, not elsewhere classified - Plan: PT plan of care cert/re-cert  Pain in right shoulder - Plan: PT plan of care cert/re-cert  Localized swelling, mass and lump, trunk -  Plan: PT plan of care cert/re-cert      G-Codes - [id number]    Functional Assessment Tool Used quick DASH   Functional Limitation Carrying, moving and handling objects   Carrying, Moving and Handling Objects Current Status [id number]) At least 20 percent but less than 40 percent impaired, limited or restricted   Carrying, Moving and Handling Objects Goal Status [id number]) At least 1 percent but less than 20 percent impaired, limited or restricted       Problem List Patient Active Problem List   Diagnosis Date Noted  . Genetic testing 08/21/2015  . Cancer of right breast ([place]) 08/15/2015  . Family history of breast cancer in female 07/15/2015  . Family history of colon cancer 07/15/2015  . Breast cancer of lower-outer quadrant of right female breast ([place]) 07/08/2015  . Disturbance in sleep behavior 11/18/2014  . Fibromyalgia 10/04/2014  . Flu-like symptoms 07/09/2014  . Diverticulitis of colon (without mention of hemorrhage) 12/01/2013  . UTI (urinary tract infection) 12/01/2013  . Abdominal pain, unspecified site 12/01/2013  . Dry mouth 04/29/2013  . Dry eye 04/29/2013  . Dry skin 04/29/2013  . Obesity (BMI 30-39.9) 03/11/2013  . Sweating profusely 01/16/2013  .  Grief 10/17/2012  . Edema 09/04/2012  . Depression 09/04/2012  . Acute bronchitis 05/30/2012  . Peripheral neuropathy ([place]) 05/12/2012  . OAB (overactive bladder) 04/01/2012  . Hypokalemia 02/06/2012  . DM w/o complication type II ([place]) 02/06/2012  . Colitis 01/21/2012  . Physical exam, annual 10/22/2011  . Allergy to dogs 04/19/2011  . Polyarthralgia 09/18/2010  . Diverticulitis 09/10/2010  . Obesity 07/25/2010  . DIVERTICULOSIS, COLON 06/02/2010  . STRESS INCONTINENCE 04/25/2010  . ASTHMA 04/11/2010  . VERTIGO 04/11/2010  . ACUTE SINUSITIS, UNSPECIFIED 04/04/2010  . Hypothyroidism 01/19/2010  . Hyperlipidemia associated with type 2 diabetes mellitus ([place]) 01/19/2010  . DEPRESSION 01/19/2010  . Essential hypertension 01/19/2010  . GERD 01/19/2010  . PEPTIC ULCER DISEASE 01/19/2010  . OSTEOARTHRITIS 01/19/2010  . LOW BACK PAIN 01/19/2010    Tracey Evans 08/31/2015, 9:42 AM  [place], [place], [address] Phone: [phone]   Fax:  [phone]  Name: Tracey Evans MRN: [id number] Date of Birth: [birth year]   Serafina Royals, PT 08/31/2015 9:42 AM

## 2015-09-05 ENCOUNTER — Other Ambulatory Visit: Payer: Self-pay | Admitting: Hematology and Oncology

## 2015-09-07 ENCOUNTER — Other Ambulatory Visit: Payer: Self-pay | Admitting: General Practice

## 2015-09-07 MED ORDER — LEVOTHYROXINE SODIUM 75 MCG PO TABS: 75.0000 ug | ORAL_TABLET | Freq: Every day | ORAL | Status: DC

## 2015-09-12 ENCOUNTER — Other Ambulatory Visit: Payer: Self-pay

## 2015-09-12 ENCOUNTER — Telehealth: Payer: Self-pay

## 2015-09-12 ENCOUNTER — Ambulatory Visit: Payer: Medicare Other | Attending: General Surgery

## 2015-09-12 DIAGNOSIS — R222 Localized swelling, mass and lump, trunk: Secondary | ICD-10-CM | POA: Insufficient documentation

## 2015-09-12 DIAGNOSIS — M25611 Stiffness of right shoulder, not elsewhere classified: Secondary | ICD-10-CM | POA: Insufficient documentation

## 2015-09-12 DIAGNOSIS — Z483 Aftercare following surgery for neoplasm: Secondary | ICD-10-CM | POA: Insufficient documentation

## 2015-09-12 DIAGNOSIS — M25511 Pain in right shoulder: Secondary | ICD-10-CM | POA: Insufficient documentation

## 2015-09-12 DIAGNOSIS — R293 Abnormal posture: Secondary | ICD-10-CM | POA: Insufficient documentation

## 2015-09-12 NOTE — Therapy (Signed)
[place], [place], [address] Phone: [phone]   Fax:  [phone]  Physical Therapy Treatment  Patient Details  Name: Tracey Evans MRN: [id number] Date of Birth: Dec 07, 1945 Referring Provider: Dr. Brion Aliment  Encounter Date: 09/12/2015      PT End of Session - 09/12/15 1018    Visit Number 2   Number of Visits 13   PT Start Time 0951  Pt arrived late   PT Stop Time 1018   PT Time Calculation (min) 27 min   Activity Tolerance Patient tolerated treatment well   Behavior During Therapy [facility] for tasks assessed/performed      Past Medical History  Diagnosis Date  . GERD (gastroesophageal reflux disease)   . Hyperlipidemia   . Hypertension   . Depression   . Fibromyalgia   . Peptic ulcer disease   . Hypothyroidism   . Chronic lower back pain   . Asthma   . Diverticular disease   . Binge eating disorder   . Costochondritis   . Anxiety   . Hot flashes   . Shortness of breath dyspnea   . Complication of anesthesia     had bronc spasms during intubation surgery 2009 on foot-need albuterol inhaler or neb tx preop  . Angina at rest [facility])     "occurs whenever it wants to, but worse during agitation"  . Chronic bronchitis ([place])   . Type II diabetes mellitus ([place])     "diet controlled" (08/15/2015)  . Osteoarthritis     "all over"  . Breast cancer of lower-outer quadrant of left female breast ([place]) 07/08/2015    "NEVER HAD LEFT BREAST CANCER" (08/15/2015)  . Cancer of right breast ([place]) 08/15/2015    Past Surgical History  Procedure Laterality Date  . Tonsillectomy    . Shoulder arthroscopy Bilateral 2009-2011    "bone spurs"  . Colonoscopy    . Ankle fracture surgery Right 2009    Dr. Rolena Infante  . Ankle arthroscopy  01/02/2012    Procedure: ANKLE ARTHROSCOPY;  Surgeon: Colin Rhein, MD;  Location: [place];  Service: Orthopedics;  Laterality: Right;  right ankle  arthroscopy with extensive debridement , dridement and drilling talar dome osteochondral lesion  . Knee arthroscopy Bilateral   . Carpal tunnel release Bilateral   . Cardiac catheterization    . Mastectomy complete / simple w/ sentinel node biopsy Right 08/15/2015  . Laparoscopic cholecystectomy    . Fracture surgery    . Dilation and curettage of uterus      "before hysterectomy"  . Abdominal hysterectomy      "left my ovaries"  . Portacath placement Left 08/15/2015  . Breast biopsy  07/2015  . Mastectomy w/ sentinel node biopsy Right 08/15/2015    Procedure: TOTAL MASTECTOMY WITH SENTINEL LYMPH NODE BIOPSY AND BLUE DYE INJECTION ;  Surgeon: Fanny Skates, MD;  Location: [place];  Service: General;  Laterality: Right;  . Portacath placement Left 08/15/2015    Procedure: INSERTION PORT-A-CATH ;  Surgeon: Fanny Skates, MD;  Location: [place];  Service: General;  Laterality: Left;    There were no vitals filed for this visit.      Subjective Assessment - 09/12/15 0952    Subjective Got my last drain removed Friday and I've been working on my exercises so I can move my arm better now. My Rt shoulder only hurts when I'm moving it.    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  Took Arimidex until day of surgery on May 8th, 2017 and portacath was placed at left chest along with right mastectomy.  Has had one drain removed and one is left.  Fibromyalgia diagnosed 20 years ago and says she manages it.  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  Has had arthroscopic debridement of both shoulders 2010.  Both knee arthroscopic surgery in the past; fracture of right ankle about 2011 with ORIF, subsequently removed.  HTN controlled  with meds.  Angina and usually remembers her nitroglycerin, but says it is controlled.   Patient Stated Goals check ROM and get arm back to normal   Currently in Pain? Yes   Pain Score 8    Pain Location Shoulder   Pain Orientation Right   Pain Descriptors / Indicators Tingling;Aching   Pain Onset More than a month ago   Pain Frequency Intermittent   Aggravating Factors  moving arm too much   Pain Relieving Factors it's been feeling better            [facility] PT Assessment - 09/12/15 0001    AROM   Right Shoulder Flexion 134 Degrees   Right Shoulder ABduction 131 Degrees                     [facility] Adult PT Treatment/Exercise - 09/12/15 0001    Manual Therapy   Myofascial Release To Rt chest wall and UE pulling throughout P/ROM   Passive ROM In Supine to Rt shoulder into flexion, abduction, er, and D2 to pts tolerance.                    Short Term Clinic Goals - 09/12/15 1213    CC Short Term Goal  #1   Title Independent in a home exercise program for right shoulder ROM and in safe self-progression.   Status Partially Met   CC Short Term Goal  #2   Title Right shoulder flexion to at least 115 for improved overhead reach.   Baseline 97 at eval; 131 prior to surgery, 134 degrees 09/12/15   Status Achieved   CC Short Term Goal  #3   Title Right shoulder abduction to at least 110 degrees for improved ADLs   Baseline 85 at eval, 150 prior to surgery, 131 degrees 09/12/15   Status Achieved          Long Term Clinic Goals - 08/31/15 7416    CC Long Term Goal  #1   Title Right shoulder active flexion to at least 125 degrees for improved overhead reach.   Baseline 97 at eval, 131 prior to surgery   Time 6   Period Weeks   Status New   CC Long Term Goal  #2   Title right shoulder active abduction to at least 140 degrees   Baseline 85 at eval, 150 prior to surgery   Time 6   Period Weeks   Status New   CC Long Term Goal  #3   Title Right shoulder pain  on movement decreased at least 50%.   Time 6   Period Weeks   Status New  Plan - 09/12/15 1210    Clinical Impression Statement Pt had her last drain removed this past Fridayand is doing well with this. She has been working on stretching her Rt shoulder at home and has made progress towards goals with this. She is doing well and tolerated P/ROM well today as well with no increased pain. She currently has an abcess near her tooth which may affect her start date of chemo.    Rehab Potential Excellent   Clinical Impairments Affecting Rehab Potential Surgery still recent (08/15/15); will start chem 09/13/15 or so.   PT Frequency 2x / week   PT Duration 6 weeks   PT Treatment/Interventions ADLs/Self Care Home Management;Therapeutic exercise;Patient/family education;Manual techniques;Manual lymph drainage;Scar mobilization;Passive range of motion   PT Next Visit Plan Cont P/AA/A/ROM.  Consider manual lymph drainage to right flank area of swelling at lateral incision.  Progress HEP.  Suggest ABC class.  Six weeks post surgery, consider scar mobilization.   Consulted and Agree with Plan of Care Patient      Patient will benefit from skilled therapeutic intervention in order to improve the following deficits and impairments:  Decreased knowledge of precautions, Decreased range of motion, Increased edema, Decreased scar mobility, Impaired UE functional use  Visit Diagnosis: Stiffness of right shoulder, not elsewhere classified  Aftercare following surgery for neoplasm  Pain in right shoulder  Localized swelling, mass and lump, trunk  Abnormal posture     Problem List Patient Active Problem List   Diagnosis Date Noted  . Genetic testing 08/21/2015  . Cancer of right breast ([place]) 08/15/2015  . Family history of breast cancer in female 07/15/2015  . Family history of colon cancer 07/15/2015  . Breast cancer of lower-outer quadrant of right female breast ([place]) 07/08/2015  .  Disturbance in sleep behavior 11/18/2014  . Fibromyalgia 10/04/2014  . Flu-like symptoms 07/09/2014  . Diverticulitis of colon (without mention of hemorrhage) 12/01/2013  . UTI (urinary tract infection) 12/01/2013  . Abdominal pain, unspecified site 12/01/2013  . Dry mouth 04/29/2013  . Dry eye 04/29/2013  . Dry skin 04/29/2013  . Obesity (BMI 30-39.9) 03/11/2013  . Sweating profusely 01/16/2013  . Grief 10/17/2012  . Edema 09/04/2012  . Depression 09/04/2012  . Acute bronchitis 05/30/2012  . Peripheral neuropathy ([place]) 05/12/2012  . OAB (overactive bladder) 04/01/2012  . Hypokalemia 02/06/2012  . DM w/o complication type II ([place]) 02/06/2012  . Colitis 01/21/2012  . Physical exam, annual 10/22/2011  . Allergy to dogs 04/19/2011  . Polyarthralgia 09/18/2010  . Diverticulitis 09/10/2010  . Obesity 07/25/2010  . DIVERTICULOSIS, COLON 06/02/2010  . STRESS INCONTINENCE 04/25/2010  . ASTHMA 04/11/2010  . VERTIGO 04/11/2010  . ACUTE SINUSITIS, UNSPECIFIED 04/04/2010  . Hypothyroidism 01/19/2010  . Hyperlipidemia associated with type 2 diabetes mellitus ([place]) 01/19/2010  . DEPRESSION 01/19/2010  . Essential hypertension 01/19/2010  . GERD 01/19/2010  . PEPTIC ULCER DISEASE 01/19/2010  . OSTEOARTHRITIS 01/19/2010  . LOW BACK PAIN 01/19/2010    Otelia Limes 09/12/2015, 12:15 PM  [place], [place], [address] Phone: [phone]   Fax:  [phone]  Name: Tracey Evans MRN: [id number] Date of Birth: May 08, 1945

## 2015-09-12 NOTE — Telephone Encounter (Signed)
Pt called stating she had an abcess on her tooth. She did not see dentist. She has a temporary ___ and her gums were swollen between the teeth. She started taking amoxicillin she had left over from when she had the flu. She has taken amoxicillin for 4 days. The swelling is going down but not all gone. She is wanting to know if she will start her chemo tomorrow. Her daughter is driving up from [place] this evening to be with her for 1st chemo.

## 2015-09-12 NOTE — Telephone Encounter (Signed)
Pt called stating she did get the message and understands to wait for call from scheduler for next week. She apologized her phone cut off but she did understand the message.

## 2015-09-12 NOTE — Progress Notes (Signed)
Received message from triage.  Pt states she has tooth abscess and is questioning whether she can do first chemo treatment tomorrow as scheduled.  Lindi Adie, MD notified and order given to move chemo to next week.  POF entered to cancel all appointments tomorrow and reschedule for next week.  Called pt back to let her know this information and the change in her schedule.  Pt verbalized understanding.  I instructed pt our schedulers would be calling her with the specific time and date of her rescheduled appointments.  Pt without further questions or concerns at time of call and understands to let us know of any other changes or symptoms should they arise.

## 2015-09-13 ENCOUNTER — Encounter: Payer: Medicare Other | Admitting: Nutrition

## 2015-09-13 ENCOUNTER — Encounter (HOSPITAL_COMMUNITY): Payer: Self-pay

## 2015-09-13 ENCOUNTER — Ambulatory Visit: Payer: Medicare Other | Admitting: Hematology and Oncology

## 2015-09-13 ENCOUNTER — Ambulatory Visit: Payer: Medicare Other

## 2015-09-13 ENCOUNTER — Other Ambulatory Visit: Payer: Medicare Other

## 2015-09-14 ENCOUNTER — Telehealth: Payer: Self-pay

## 2015-09-14 ENCOUNTER — Other Ambulatory Visit: Payer: Self-pay

## 2015-09-14 ENCOUNTER — Telehealth: Payer: Self-pay | Admitting: Hematology and Oncology

## 2015-09-14 NOTE — Progress Notes (Signed)
Pt called to confirm tooth extraction date and time.  She will have tooth pulled tomorrow 09/15/15 at 8:00am.  Per Dr. Lindi Adie, pt can have treatment next Thursday or Friday after allowing time to heal.  Pt informed of this information and POF sent.  Pt instructed she will be notified by scheduling with the appointment time and date.  Pt verbalized understanding and no further questions or concerns at time of call.

## 2015-09-14 NOTE — Telephone Encounter (Signed)
Received VM from pt stating she was having a tooth extracted and wanted to let us know.  Pt with known abscess so chemotherapy has already been delayed and pt unsure of if this will cause further delay.  Lindi Adie, MD notified who stated pt could have chemotherapy after 1 week following extraction.  I called pt back to find out exactly when she was having extraction and thus to schedule chemo accordingly.  Unable to reach pt, VM left with this information.  Pt instructed to call us back with details of extraction.

## 2015-09-14 NOTE — Telephone Encounter (Signed)
spoke w/ pt  confirmed 6/13 apts per 6/5 pof... MD said it was ok to sched pt earlier on the 13 to fit tx in time

## 2015-09-15 ENCOUNTER — Telehealth: Payer: Self-pay | Admitting: Hematology and Oncology

## 2015-09-15 NOTE — Telephone Encounter (Signed)
R/s appt per 6/8 pof. lvm to inform patient of new appt date/time 6/15 at 815 am

## 2015-09-16 ENCOUNTER — Ambulatory Visit: Payer: Medicare Other | Admitting: Physical Therapy

## 2015-09-16 DIAGNOSIS — M25511 Pain in right shoulder: Secondary | ICD-10-CM

## 2015-09-16 DIAGNOSIS — Z483 Aftercare following surgery for neoplasm: Secondary | ICD-10-CM | POA: Diagnosis not present

## 2015-09-16 DIAGNOSIS — R222 Localized swelling, mass and lump, trunk: Secondary | ICD-10-CM | POA: Diagnosis not present

## 2015-09-16 DIAGNOSIS — R293 Abnormal posture: Secondary | ICD-10-CM | POA: Diagnosis not present

## 2015-09-16 DIAGNOSIS — M25611 Stiffness of right shoulder, not elsewhere classified: Secondary | ICD-10-CM | POA: Diagnosis not present

## 2015-09-16 NOTE — Patient Instructions (Addendum)
Flexors Stick Stretch I    Stand or sit, dowel in palm of arm to be stretched. Other arm, holding dowel at side and behind body, pushes arm being stretched forward and upward until straight over head. Hold _5__ seconds. Repeat _5__ times per session. Do _2__ sessions per day.  Copyright  VHI. All rights reserved.  Inferior Capsule Stick Stretch I    Stand or sit, dowel in palm of arm to be stretched. Other arm, holding dowel in front of body, pushes outward and upward until arm being stretched is as high to the side as possible. Hold _5__ seconds. Repeat _5__ times per session. Do _2__ sessions per day.  Copyright  VHI. All rights reserved.    Supine With Rotation    Lie, back flat, legs bent, feet together.  Put your arms straight out away from your body. Rotate knees to left side. Hold __30_ seconds. Repeat _2__ times per session. Do _1-2__ sessions per day.  Copyright  VHI. All rights reserved.

## 2015-09-16 NOTE — Therapy (Signed)
[place], [place], [address] Phone: [phone]   Fax:  [phone]  Physical Therapy Treatment  Patient Details  Name: Tracey Evans MRN: [id number] Date of Birth: 1945-12-16 Referring Provider: Dr. Brion Aliment  Encounter Date: 09/16/2015      PT End of Session - 09/16/15 1212    Visit Number 3   Number of Visits 13   PT Start Time 0937   PT Stop Time 1021   PT Time Calculation (min) 44 min   Activity Tolerance Patient tolerated treatment well   Behavior During Therapy [facility] for tasks assessed/performed      Past Medical History  Diagnosis Date  . GERD (gastroesophageal reflux disease)   . Hyperlipidemia   . Hypertension   . Depression   . Fibromyalgia   . Peptic ulcer disease   . Hypothyroidism   . Chronic lower back pain   . Asthma   . Diverticular disease   . Binge eating disorder   . Costochondritis   . Anxiety   . Hot flashes   . Shortness of breath dyspnea   . Complication of anesthesia     had bronc spasms during intubation surgery 2009 on foot-need albuterol inhaler or neb tx preop  . Angina at rest [facility])     "occurs whenever it wants to, but worse during agitation"  . Chronic bronchitis ([place])   . Type II diabetes mellitus ([place])     "diet controlled" (08/15/2015)  . Osteoarthritis     "all over"  . Breast cancer of lower-outer quadrant of left female breast ([place]) 07/08/2015    "NEVER HAD LEFT BREAST CANCER" (08/15/2015)  . Cancer of right breast ([place]) 08/15/2015    Past Surgical History  Procedure Laterality Date  . Tonsillectomy    . Shoulder arthroscopy Bilateral 2009-2011    "bone spurs"  . Colonoscopy    . Ankle fracture surgery Right 2009    Dr. Rolena Infante  . Ankle arthroscopy  01/02/2012    Procedure: ANKLE ARTHROSCOPY;  Surgeon: Colin Rhein, MD;  Location: [place];  Service: Orthopedics;  Laterality: Right;  right ankle arthroscopy with  extensive debridement , dridement and drilling talar dome osteochondral lesion  . Knee arthroscopy Bilateral   . Carpal tunnel release Bilateral   . Cardiac catheterization    . Mastectomy complete / simple w/ sentinel node biopsy Right 08/15/2015  . Laparoscopic cholecystectomy    . Fracture surgery    . Dilation and curettage of uterus      "before hysterectomy"  . Abdominal hysterectomy      "left my ovaries"  . Portacath placement Left 08/15/2015  . Breast biopsy  07/2015  . Mastectomy w/ sentinel node biopsy Right 08/15/2015    Procedure: TOTAL MASTECTOMY WITH SENTINEL LYMPH NODE BIOPSY AND BLUE DYE INJECTION ;  Surgeon: Fanny Skates, MD;  Location: [place];  Service: General;  Laterality: Right;  . Portacath placement Left 08/15/2015    Procedure: INSERTION PORT-A-CATH ;  Surgeon: Fanny Skates, MD;  Location: [place];  Service: General;  Laterality: Left;    There were no vitals filed for this visit.      Subjective Assessment - 09/16/15 0939    Subjective "My fibromyalgia is wide open."  "Had my tooth pulled yesterday, so I'll be able to start my chemo next Thursday."   Currently in Pain? Yes   Pain Score 8    Pain Location Back   Aggravating  Factors  fibromyalgia flare-up   Pain Relieving Factors medication; mind over matter                         [facility] Adult PT Treatment/Exercise - 09/16/15 0001    Exercises   Exercises Other Exercises;Shoulder   Other Exercises  Pt. demonstrated her current HEP: active right shoulder abduction and reaching right hand over to left ear; punches forward; horizontal ab-and adduction;bicep curls without weights;     Shoulder Exercises: Supine   Other Supine Exercises hooklying with arms outstretched, lower trunk rotation to left for right chest stretch x 30 seconds x 2   Shoulder Exercises: Standing   Flexion AAROM;Right;5 reps  with dowel   ABduction AAROM;Right;5 reps  with dowel   Manual Therapy   Myofascial Release Right  UE myofascial pulling with movement of right shoulder through partial range of abduction   Passive ROM In supine, right shoulder for ER, abduction, and flexion with stretch to patient tolerance. Supine over towel roll, manual pect minor stretch bilat.                  PT Education - 09/16/15 1213    Education provided Yes   Education Details dowel exercises for right shoulder flexion and abduction; supine hooklying lower trunk rotation with arms outstretched for right chest stretch   Person(s) Educated Patient   Methods Explanation;Handout;Verbal cues;Demonstration   Comprehension Verbalized understanding;Returned demonstration           Short Term Clinic Goals - 09/12/15 1213    CC Short Term Goal  #1   Title Independent in a home exercise program for right shoulder ROM and in safe self-progression.   Status Partially Met   CC Short Term Goal  #2   Title Right shoulder flexion to at least 115 for improved overhead reach.   Baseline 97 at eval; 131 prior to surgery, 134 degrees 09/12/15   Status Achieved   CC Short Term Goal  #3   Title Right shoulder abduction to at least 110 degrees for improved ADLs   Baseline 85 at eval, 150 prior to surgery, 131 degrees 09/12/15   Status Achieved           Breast Clinic Goals - 07/13/15 1339    Patient will be able to verbalize understanding of pertinent lymphedema risk reduction practices relevant to her diagnosis specifically related to skin care.   Time 1   Period Days   Status Achieved   Patient will be able to return demonstrate and/or verbalize understanding of the post-op home exercise program related to regaining shoulder range of motion.   Time 1   Period Days   Status Achieved   Patient will be able to verbalize understanding of the importance of attending the postoperative After Breast Cancer Class for further lymphedema risk reduction education and therapeutic exercise.   Time 1   Period Days   Status Achieved           Long Term Clinic Goals - 08/31/15 2979    CC Long Term Goal  #1   Title Right shoulder active flexion to at least 125 degrees for improved overhead reach.   Baseline 97 at eval, 131 prior to surgery   Time 6   Period Weeks   Status New   CC Long Term Goal  #2   Title right shoulder active abduction to at least 140 degrees   Baseline 85 at eval, 150  prior to surgery   Time 6   Period Weeks   Status New   CC Long Term Goal  #3   Title Right shoulder pain on movement decreased at least 50%.   Time 6   Period Weeks   Status New            Plan - 09/16/15 1214    Clinical Impression Statement Patient did well with learning HEP today and with manual therapy.   Rehab Potential Excellent   Clinical Impairments Affecting Rehab Potential Surgery still recent (08/15/15); will start chem 09/13/15 or so.   PT Frequency 2x / week   PT Duration 6 weeks   PT Treatment/Interventions ADLs/Self Care Home Management;Therapeutic exercise;Patient/family education;Manual techniques;Manual lymph drainage;Scar mobilization;Passive range of motion   PT Next Visit Plan Cont P/AA/A/ROM.  Consider manual lymph drainage to right flank area of swelling at lateral incision.  Progress HEP.  Suggest ABC class.  Six weeks post surgery, consider scar mobilization.   Consulted and Agree with Plan of Care Patient      Patient will benefit from skilled therapeutic intervention in order to improve the following deficits and impairments:  Decreased knowledge of precautions, Decreased range of motion, Increased edema, Decreased scar mobility, Impaired UE functional use  Visit Diagnosis: Stiffness of right shoulder, not elsewhere classified  Aftercare following surgery for neoplasm  Pain in right shoulder     Problem List Patient Active Problem List   Diagnosis Date Noted  . Genetic testing 08/21/2015  . Cancer of right breast ([place]) 08/15/2015  . Family history of breast cancer in female  07/15/2015  . Family history of colon cancer 07/15/2015  . Breast cancer of lower-outer quadrant of right female breast ([place]) 07/08/2015  . Disturbance in sleep behavior 11/18/2014  . Fibromyalgia 10/04/2014  . Flu-like symptoms 07/09/2014  . Diverticulitis of colon (without mention of hemorrhage) 12/01/2013  . UTI (urinary tract infection) 12/01/2013  . Abdominal pain, unspecified site 12/01/2013  . Dry mouth 04/29/2013  . Dry eye 04/29/2013  . Dry skin 04/29/2013  . Obesity (BMI 30-39.9) 03/11/2013  . Sweating profusely 01/16/2013  . Grief 10/17/2012  . Edema 09/04/2012  . Depression 09/04/2012  . Acute bronchitis 05/30/2012  . Peripheral neuropathy ([place]) 05/12/2012  . OAB (overactive bladder) 04/01/2012  . Hypokalemia 02/06/2012  . DM w/o complication type II ([place]) 02/06/2012  . Colitis 01/21/2012  . Physical exam, annual 10/22/2011  . Allergy to dogs 04/19/2011  . Polyarthralgia 09/18/2010  . Diverticulitis 09/10/2010  . Obesity 07/25/2010  . DIVERTICULOSIS, COLON 06/02/2010  . STRESS INCONTINENCE 04/25/2010  . ASTHMA 04/11/2010  . VERTIGO 04/11/2010  . ACUTE SINUSITIS, UNSPECIFIED 04/04/2010  . Hypothyroidism 01/19/2010  . Hyperlipidemia associated with type 2 diabetes mellitus ([place]) 01/19/2010  . DEPRESSION 01/19/2010  . Essential hypertension 01/19/2010  . GERD 01/19/2010  . PEPTIC ULCER DISEASE 01/19/2010  . OSTEOARTHRITIS 01/19/2010  . LOW BACK PAIN 01/19/2010    Tracey Evans 09/16/2015, 12:16 PM  [place], [place], [address] Phone: [phone]   Fax:  [phone]  Name: Tracey Evans MRN: [id number] Date of Birth: [birth year]    Serafina Royals, PT 09/16/2015 12:16 PM

## 2015-09-19 ENCOUNTER — Other Ambulatory Visit: Payer: Medicare Other

## 2015-09-19 ENCOUNTER — Ambulatory Visit: Payer: Medicare Other | Admitting: Hematology and Oncology

## 2015-09-19 ENCOUNTER — Ambulatory Visit: Payer: Medicare Other | Admitting: Physical Therapy

## 2015-09-19 DIAGNOSIS — R222 Localized swelling, mass and lump, trunk: Secondary | ICD-10-CM | POA: Diagnosis not present

## 2015-09-19 DIAGNOSIS — M25611 Stiffness of right shoulder, not elsewhere classified: Secondary | ICD-10-CM | POA: Diagnosis not present

## 2015-09-19 DIAGNOSIS — Z483 Aftercare following surgery for neoplasm: Secondary | ICD-10-CM | POA: Diagnosis not present

## 2015-09-19 DIAGNOSIS — M25511 Pain in right shoulder: Secondary | ICD-10-CM

## 2015-09-19 DIAGNOSIS — R293 Abnormal posture: Secondary | ICD-10-CM | POA: Diagnosis not present

## 2015-09-19 NOTE — Therapy (Signed)
[place], [place], [address] Phone: [phone]   Fax:  [phone]  Physical Therapy Treatment  Patient Details  Name: Tracey Evans MRN: [id number] Date of Birth: [birth year] Referring Provider: Dr. Brion Aliment  Encounter Date: 09/19/2015      PT End of Session - 09/19/15 1442    Visit Number 4   Number of Visits 13   PT Start Time 5035   PT Stop Time 4656   PT Time Calculation (min) 45 min   Activity Tolerance Patient tolerated treatment well   Behavior During Therapy [facility] for tasks assessed/performed      Past Medical History  Diagnosis Date  . GERD (gastroesophageal reflux disease)   . Hyperlipidemia   . Hypertension   . Depression   . Fibromyalgia   . Peptic ulcer disease   . Hypothyroidism   . Chronic lower back pain   . Asthma   . Diverticular disease   . Binge eating disorder   . Costochondritis   . Anxiety   . Hot flashes   . Shortness of breath dyspnea   . Complication of anesthesia     had bronc spasms during intubation surgery 2009 on foot-need albuterol inhaler or neb tx preop  . Angina at rest [facility])     "occurs whenever it wants to, but worse during agitation"  . Chronic bronchitis ([place])   . Type II diabetes mellitus ([place])     "diet controlled" (08/15/2015)  . Osteoarthritis     "all over"  . Breast cancer of lower-outer quadrant of left female breast ([place]) 07/08/2015    "NEVER HAD LEFT BREAST CANCER" (08/15/2015)  . Cancer of right breast ([place]) 08/15/2015    Past Surgical History  Procedure Laterality Date  . Tonsillectomy    . Shoulder arthroscopy Bilateral 2009-2011    "bone spurs"  . Colonoscopy    . Ankle fracture surgery Right 2009    Dr. Rolena Infante  . Ankle arthroscopy  01/02/2012    Procedure: ANKLE ARTHROSCOPY;  Surgeon: Colin Rhein, MD;  Location: [place];  Service: Orthopedics;  Laterality: Right;  right ankle arthroscopy with  extensive debridement , dridement and drilling talar dome osteochondral lesion  . Knee arthroscopy Bilateral   . Carpal tunnel release Bilateral   . Cardiac catheterization    . Mastectomy complete / simple w/ sentinel node biopsy Right 08/15/2015  . Laparoscopic cholecystectomy    . Fracture surgery    . Dilation and curettage of uterus      "before hysterectomy"  . Abdominal hysterectomy      "left my ovaries"  . Portacath placement Left 08/15/2015  . Breast biopsy  07/2015  . Mastectomy w/ sentinel node biopsy Right 08/15/2015    Procedure: TOTAL MASTECTOMY WITH SENTINEL LYMPH NODE BIOPSY AND BLUE DYE INJECTION ;  Surgeon: Fanny Skates, MD;  Location: [place];  Service: General;  Laterality: Right;  . Portacath placement Left 08/15/2015    Procedure: INSERTION PORT-A-CATH ;  Surgeon: Fanny Skates, MD;  Location: [place];  Service: General;  Laterality: Left;    There were no vitals filed for this visit.      Subjective Assessment - 09/19/15 1356    Subjective I may have overdone it; I ran my vacuum cleaner and it weighs more than I do.  Got sore yesterday.  Having fibromyalgia and arthritis pain.   Currently in Pain? Yes   Pain Score 9  Pain Location Chest   Pain Orientation Right   Pain Descriptors / Indicators Sore   Aggravating Factors  nothing   Pain Relieving Factors nothing   Multiple Pain Sites Yes   Pain Score 10   Pain Location Other (Comment)  all over   Pain Descriptors / Indicators Other (Comment)  fibromyalgia                         [facility] Adult PT Treatment/Exercise - 09/19/15 0001    Manual Therapy   Manual Therapy Manual Lymphatic Drainage (MLD)   Manual therapy comments In supine, short neck, left axilla and anterior interaxillary anastomosis, right groin and axillo-inguinal anastomosis, with time spent at lateral aspect of mastectomy incision; in left sidelying, posterior interaxillary anastomosis and right axillo-inguinal anastomosis, with  time on lateral aspect of incision where there is extra tissue and some swelling.   Myofascial Release Gentle release across right axilla; right UE myofascial pulling with movement into abduction.   Passive ROM In supine with stretch to patient tolerance for right shoulder ER, abduction, and flexion   Prosthetics   Prosthetic Care Comments                      Short Term Clinic Goals - 09/12/15 1213    CC Short Term Goal  #1   Title Independent in a home exercise program for right shoulder ROM and in safe self-progression.   Status Partially Met   CC Short Term Goal  #2   Title Right shoulder flexion to at least 115 for improved overhead reach.   Baseline 97 at eval; 131 prior to surgery, 134 degrees 09/12/15   Status Achieved   CC Short Term Goal  #3   Title Right shoulder abduction to at least 110 degrees for improved ADLs   Baseline 85 at eval, 150 prior to surgery, 131 degrees 09/12/15   Status Achieved           Breast Clinic Goals - 07/13/15 1339    Patient will be able to verbalize understanding of pertinent lymphedema risk reduction practices relevant to her diagnosis specifically related to skin care.   Time 1   Period Days   Status Achieved   Patient will be able to return demonstrate and/or verbalize understanding of the post-op home exercise program related to regaining shoulder range of motion.   Time 1   Period Days   Status Achieved   Patient will be able to verbalize understanding of the importance of attending the postoperative After Breast Cancer Class for further lymphedema risk reduction education and therapeutic exercise.   Time 1   Period Days   Status Achieved          Long Term Clinic Goals - 08/31/15 6720    CC Long Term Goal  #1   Title Right shoulder active flexion to at least 125 degrees for improved overhead reach.   Baseline 97 at eval, 131 prior to surgery   Time 6   Period Weeks   Status New   CC Long Term Goal  #2   Title  right shoulder active abduction to at least 140 degrees   Baseline 85 at eval, 150 prior to surgery   Time 6   Period Weeks   Status New   CC Long Term Goal  #3   Title Right shoulder pain on movement decreased at least 50%.   Time 6   Period  Weeks   Status New            Plan - 09/19/15 1443    Clinical Impression Statement Patient still with a flareup of her fibromyalgia making her quite uncomfortable and slowing down her mobility, but still able to tolerate right shoulder and chest area stretching.   We did do a trial of manual lymph drainage today.  She has some financial concerns and was told about the [employer] as a possible help with some of these, and how to check on this.   Rehab Potential Excellent   Clinical Impairments Affecting Rehab Potential Surgery still recent (08/15/15); will start chem 09/13/15 or so.   PT Frequency 2x / week   PT Duration 6 weeks   PT Treatment/Interventions ADLs/Self Care Home Management;Therapeutic exercise;Patient/family education;Manual techniques;Manual lymph drainage;Scar mobilization;Passive range of motion   PT Next Visit Plan Check goals. Cont P/AA/A/ROM.  Continue manual lymph drainage to right flank area of swelling at lateral incision if beneficial.  Progress HEP.  Suggest ABC class.  Six weeks post surgery, consider scar mobilization.   Consulted and Agree with Plan of Care Patient      Patient will benefit from skilled therapeutic intervention in order to improve the following deficits and impairments:  Decreased knowledge of precautions, Decreased range of motion, Increased edema, Decreased scar mobility, Impaired UE functional use  Visit Diagnosis: Stiffness of right shoulder, not elsewhere classified  Aftercare following surgery for neoplasm  Pain in right shoulder     Problem List Patient Active Problem List   Diagnosis Date Noted  . Genetic testing 08/21/2015  . Cancer of right breast ([place]) 08/15/2015  . Family  history of breast cancer in female 07/15/2015  . Family history of colon cancer 07/15/2015  . Breast cancer of lower-outer quadrant of right female breast ([place]) 07/08/2015  . Disturbance in sleep behavior 11/18/2014  . Fibromyalgia 10/04/2014  . Flu-like symptoms 07/09/2014  . Diverticulitis of colon (without mention of hemorrhage) 12/01/2013  . UTI (urinary tract infection) 12/01/2013  . Abdominal pain, unspecified site 12/01/2013  . Dry mouth 04/29/2013  . Dry eye 04/29/2013  . Dry skin 04/29/2013  . Obesity (BMI 30-39.9) 03/11/2013  . Sweating profusely 01/16/2013  . Grief 10/17/2012  . Edema 09/04/2012  . Depression 09/04/2012  . Acute bronchitis 05/30/2012  . Peripheral neuropathy ([place]) 05/12/2012  . OAB (overactive bladder) 04/01/2012  . Hypokalemia 02/06/2012  . DM w/o complication type II ([place]) 02/06/2012  . Colitis 01/21/2012  . Physical exam, annual 10/22/2011  . Allergy to dogs 04/19/2011  . Polyarthralgia 09/18/2010  . Diverticulitis 09/10/2010  . Obesity 07/25/2010  . DIVERTICULOSIS, COLON 06/02/2010  . STRESS INCONTINENCE 04/25/2010  . ASTHMA 04/11/2010  . VERTIGO 04/11/2010  . ACUTE SINUSITIS, UNSPECIFIED 04/04/2010  . Hypothyroidism 01/19/2010  . Hyperlipidemia associated with type 2 diabetes mellitus ([place]) 01/19/2010  . DEPRESSION 01/19/2010  . Essential hypertension 01/19/2010  . GERD 01/19/2010  . PEPTIC ULCER DISEASE 01/19/2010  . OSTEOARTHRITIS 01/19/2010  . LOW BACK PAIN 01/19/2010    Tracey Evans 09/19/2015, 5:28 PM  [place], [place], [address] Phone: [phone]   Fax:  [phone]  Name: Tracey Evans MRN: [id number] Date of Birth: 1945-11-30    Tracey Evans, PT 09/19/2015 5:28 PM

## 2015-09-20 ENCOUNTER — Other Ambulatory Visit: Payer: Medicare Other

## 2015-09-20 ENCOUNTER — Ambulatory Visit: Payer: Medicare Other

## 2015-09-20 ENCOUNTER — Ambulatory Visit: Payer: Medicare Other | Admitting: Hematology and Oncology

## 2015-09-21 ENCOUNTER — Ambulatory Visit: Payer: Medicare Other | Admitting: Physical Therapy

## 2015-09-21 DIAGNOSIS — M25611 Stiffness of right shoulder, not elsewhere classified: Secondary | ICD-10-CM | POA: Diagnosis not present

## 2015-09-21 DIAGNOSIS — R222 Localized swelling, mass and lump, trunk: Secondary | ICD-10-CM | POA: Diagnosis not present

## 2015-09-21 DIAGNOSIS — Z483 Aftercare following surgery for neoplasm: Secondary | ICD-10-CM

## 2015-09-21 DIAGNOSIS — M25511 Pain in right shoulder: Secondary | ICD-10-CM

## 2015-09-21 DIAGNOSIS — R293 Abnormal posture: Secondary | ICD-10-CM | POA: Diagnosis not present

## 2015-09-21 NOTE — Therapy (Signed)
[place], [place], [address] Phone: [phone]   Fax:  [phone]  Physical Therapy Treatment  Patient Details  Name: Tracey Evans MRN: [id number] Date of Birth: Jul 02, 1945 Referring Provider: Dr. Brion Aliment  Encounter Date: 09/21/2015      PT End of Session - 09/21/15 1157    Visit Number 5   Number of Visits 13   Date for PT Re-Evaluation 10/14/15   PT Start Time 0938   PT Stop Time 1021   PT Time Calculation (min) 43 min   Activity Tolerance Patient tolerated treatment well   Behavior During Therapy [facility] for tasks assessed/performed      Past Medical History  Diagnosis Date  . GERD (gastroesophageal reflux disease)   . Hyperlipidemia   . Hypertension   . Depression   . Fibromyalgia   . Peptic ulcer disease   . Hypothyroidism   . Chronic lower back pain   . Asthma   . Diverticular disease   . Binge eating disorder   . Costochondritis   . Anxiety   . Hot flashes   . Shortness of breath dyspnea   . Complication of anesthesia     had bronc spasms during intubation surgery 2009 on foot-need albuterol inhaler or neb tx preop  . Angina at rest [facility])     "occurs whenever it wants to, but worse during agitation"  . Chronic bronchitis ([place])   . Type II diabetes mellitus ([place])     "diet controlled" (08/15/2015)  . Osteoarthritis     "all over"  . Breast cancer of lower-outer quadrant of left female breast ([place]) 07/08/2015    "NEVER HAD LEFT BREAST CANCER" (08/15/2015)  . Cancer of right breast ([place]) 08/15/2015    Past Surgical History  Procedure Laterality Date  . Tonsillectomy    . Shoulder arthroscopy Bilateral 2009-2011    "bone spurs"  . Colonoscopy    . Ankle fracture surgery Right 2009    Dr. Rolena Infante  . Ankle arthroscopy  01/02/2012    Procedure: ANKLE ARTHROSCOPY;  Surgeon: Colin Rhein, MD;  Location: [place];  Service: Orthopedics;  Laterality:  Right;  right ankle arthroscopy with extensive debridement , dridement and drilling talar dome osteochondral lesion  . Knee arthroscopy Bilateral   . Carpal tunnel release Bilateral   . Cardiac catheterization    . Mastectomy complete / simple w/ sentinel node biopsy Right 08/15/2015  . Laparoscopic cholecystectomy    . Fracture surgery    . Dilation and curettage of uterus      "before hysterectomy"  . Abdominal hysterectomy      "left my ovaries"  . Portacath placement Left 08/15/2015  . Breast biopsy  07/2015  . Mastectomy w/ sentinel node biopsy Right 08/15/2015    Procedure: TOTAL MASTECTOMY WITH SENTINEL LYMPH NODE BIOPSY AND BLUE DYE INJECTION ;  Surgeon: Fanny Skates, MD;  Location: [place];  Service: General;  Laterality: Right;  . Portacath placement Left 08/15/2015    Procedure: INSERTION PORT-A-CATH ;  Surgeon: Fanny Skates, MD;  Location: [place];  Service: General;  Laterality: Left;    There were no vitals filed for this visit.      Subjective Assessment - 09/21/15 0939    Subjective "I have nausea and diarrhea today.  I pigged out on pork barbecue and baked beans last night."  Right abdomen has increased again and it's sore near the drain sites.  Feels like  the manual lymph drainage did help reduce the right mastectomy dogear a bit.   Currently in Pain? Yes   Pain Score 8    Pain Location Abdomen   Pain Orientation Right   Aggravating Factors  not sure, but increased swelling   Pain Score 10   Pain Location Back   Aggravating Factors  fibromyalgia            [facility] PT Assessment - 09/21/15 0001    AROM   Right Shoulder Flexion 134 Degrees   Right Shoulder ABduction 160 Degrees  toward scaption                     [facility] Adult PT Treatment/Exercise - 09/21/15 0001    Manual Therapy   Manual therapy comments In supine, short neck, left axilla and anterior interaxillary anastomosis, right groin and axillo-inguinal anastomosis, with time spent at lateral  aspect of mastectomy incision; in left sidelying, posterior interaxillary anastomosis and right axillo-inguinal anastomosis, with time on lateral aspect of incision where there is extra tissue and some swelling.   Myofascial Release Gentle release across right axilla; right UE myofascial pulling with movement into abduction.   Passive ROM In supine with stretch to patient tolerance for right shoulder ER, abduction, and flexion   Prosthetics   Edema                     Short Term Clinic Goals - 09/12/15 1213    CC Short Term Goal  #1   Title Independent in a home exercise program for right shoulder ROM and in safe self-progression.   Status Partially Met   CC Short Term Goal  #2   Title Right shoulder flexion to at least 115 for improved overhead reach.   Baseline 97 at eval; 131 prior to surgery, 134 degrees 09/12/15   Status Achieved   CC Short Term Goal  #3   Title Right shoulder abduction to at least 110 degrees for improved ADLs   Baseline 85 at eval, 150 prior to surgery, 131 degrees 09/12/15   Status Achieved           Breast Clinic Goals - 07/13/15 1339    Patient will be able to verbalize understanding of pertinent lymphedema risk reduction practices relevant to her diagnosis specifically related to skin care.   Time 1   Period Days   Status Achieved   Patient will be able to return demonstrate and/or verbalize understanding of the post-op home exercise program related to regaining shoulder range of motion.   Time 1   Period Days   Status Achieved   Patient will be able to verbalize understanding of the importance of attending the postoperative After Breast Cancer Class for further lymphedema risk reduction education and therapeutic exercise.   Time 1   Period Days   Status Achieved          Long Term Clinic Goals - 09/21/15 1205    CC Long Term Goal  #1   Title Right shoulder active flexion to at least 125 degrees for improved overhead reach.   Status  Achieved   CC Long Term Goal  #2   Title right shoulder active abduction to at least 140 degrees   Status Achieved   CC Long Term Goal  #3   Title Right shoulder pain on movement decreased at least 50%.   Status On-going            Plan -  09/21/15 1203    Clinical Impression Statement Right shoulder active flexion is the same today as at last measurement, but abduction has improved considerably.   Rehab Potential Excellent   Clinical Impairments Affecting Rehab Potential Surgery still recent (08/15/15); will start chemo 09/22/15.   PT Frequency 2x / week   PT Duration 6 weeks   PT Treatment/Interventions ADLs/Self Care Home Management;Therapeutic exercise;Patient/family education;Manual techniques;Manual lymph drainage;Scar mobilization;Passive range of motion   PT Next Visit Plan  Cont P/AA/A/ROM.  Continue manual lymph drainage to right flank area of swelling at lateral incision if beneficial.  Progress HEP.  Suggest ABC class.  Six weeks post surgery, consider scar mobilization.   Recommended Other Services Suggested that patient contact Dr. Dalbert Batman about recurrence of right abdominal swelling.   Consulted and Agree with Plan of Care Patient      Patient will benefit from skilled therapeutic intervention in order to improve the following deficits and impairments:  Decreased knowledge of precautions, Decreased range of motion, Increased edema, Decreased scar mobility, Impaired UE functional use  Visit Diagnosis: Stiffness of right shoulder, not elsewhere classified  Aftercare following surgery for neoplasm  Pain in right shoulder  Localized swelling, mass and lump, trunk     Problem List Patient Active Problem List   Diagnosis Date Noted  . Genetic testing 08/21/2015  . Cancer of right breast ([place]) 08/15/2015  . Family history of breast cancer in female 07/15/2015  . Family history of colon cancer 07/15/2015  . Breast cancer of lower-outer quadrant of right female breast  ([place]) 07/08/2015  . Disturbance in sleep behavior 11/18/2014  . Fibromyalgia 10/04/2014  . Flu-like symptoms 07/09/2014  . Diverticulitis of colon (without mention of hemorrhage) 12/01/2013  . UTI (urinary tract infection) 12/01/2013  . Abdominal pain, unspecified site 12/01/2013  . Dry mouth 04/29/2013  . Dry eye 04/29/2013  . Dry skin 04/29/2013  . Obesity (BMI 30-39.9) 03/11/2013  . Sweating profusely 01/16/2013  . Grief 10/17/2012  . Edema 09/04/2012  . Depression 09/04/2012  . Acute bronchitis 05/30/2012  . Peripheral neuropathy ([place]) 05/12/2012  . OAB (overactive bladder) 04/01/2012  . Hypokalemia 02/06/2012  . DM w/o complication type II ([place]) 02/06/2012  . Colitis 01/21/2012  . Physical exam, annual 10/22/2011  . Allergy to dogs 04/19/2011  . Polyarthralgia 09/18/2010  . Diverticulitis 09/10/2010  . Obesity 07/25/2010  . DIVERTICULOSIS, COLON 06/02/2010  . STRESS INCONTINENCE 04/25/2010  . ASTHMA 04/11/2010  . VERTIGO 04/11/2010  . ACUTE SINUSITIS, UNSPECIFIED 04/04/2010  . Hypothyroidism 01/19/2010  . Hyperlipidemia associated with type 2 diabetes mellitus ([place]) 01/19/2010  . DEPRESSION 01/19/2010  . Essential hypertension 01/19/2010  . GERD 01/19/2010  . PEPTIC ULCER DISEASE 01/19/2010  . OSTEOARTHRITIS 01/19/2010  . LOW BACK PAIN 01/19/2010    SALISBURY,DONNA 09/21/2015, 12:07 PM  [place], [place], [address] Phone: [phone]   Fax:  [phone]  Name: Tracey Evans MRN: [id number] Date of Birth: 05/13/1945    Serafina Royals, PT 09/21/2015 12:07 PM

## 2015-09-22 ENCOUNTER — Other Ambulatory Visit: Payer: Self-pay | Admitting: *Deleted

## 2015-09-22 ENCOUNTER — Ambulatory Visit (HOSPITAL_BASED_OUTPATIENT_CLINIC_OR_DEPARTMENT_OTHER): Payer: Medicare Other

## 2015-09-22 ENCOUNTER — Other Ambulatory Visit: Payer: Self-pay | Admitting: Hematology and Oncology

## 2015-09-22 ENCOUNTER — Telehealth: Payer: Self-pay | Admitting: Hematology and Oncology

## 2015-09-22 ENCOUNTER — Other Ambulatory Visit (HOSPITAL_BASED_OUTPATIENT_CLINIC_OR_DEPARTMENT_OTHER): Payer: Medicare Other

## 2015-09-22 ENCOUNTER — Ambulatory Visit (HOSPITAL_BASED_OUTPATIENT_CLINIC_OR_DEPARTMENT_OTHER): Payer: Medicare Other | Admitting: Hematology and Oncology

## 2015-09-22 ENCOUNTER — Encounter: Payer: Self-pay | Admitting: Hematology and Oncology

## 2015-09-22 VITALS — BP 99/57 | HR 85 | Temp 98.7°F | Resp 17

## 2015-09-22 VITALS — BP 121/76 | HR 88 | Temp 98.2°F | Resp 18 | Ht 66.0 in | Wt 196.0 lb

## 2015-09-22 DIAGNOSIS — Z5111 Encounter for antineoplastic chemotherapy: Secondary | ICD-10-CM | POA: Diagnosis not present

## 2015-09-22 DIAGNOSIS — Z170 Estrogen receptor positive status [ER+]: Secondary | ICD-10-CM

## 2015-09-22 DIAGNOSIS — Z5112 Encounter for antineoplastic immunotherapy: Secondary | ICD-10-CM

## 2015-09-22 DIAGNOSIS — C50511 Malignant neoplasm of lower-outer quadrant of right female breast: Secondary | ICD-10-CM | POA: Diagnosis not present

## 2015-09-22 DIAGNOSIS — Z5189 Encounter for other specified aftercare: Secondary | ICD-10-CM | POA: Diagnosis not present

## 2015-09-22 LAB — CBC WITH DIFFERENTIAL/PLATELET
BASO%: 0.1 % (ref 0.0–2.0)
BASOS ABS: 0 10*3/uL (ref 0.0–0.1)
EOS%: 0 % (ref 0.0–7.0)
Eosinophils Absolute: 0 10*3/uL (ref 0.0–0.5)
HCT: 40.1 % (ref 34.8–46.6)
HGB: 13.5 g/dL (ref 11.6–15.9)
LYMPH#: 2.7 10*3/uL (ref 0.9–3.3)
LYMPH%: 23.7 % (ref 14.0–49.7)
MCH: 30.5 pg (ref 25.1–34.0)
MCHC: 33.7 g/dL (ref 31.5–36.0)
MCV: 90.7 fL (ref 79.5–101.0)
MONO#: 0.4 10*3/uL (ref 0.1–0.9)
MONO%: 3.3 % (ref 0.0–14.0)
NEUT#: 8.2 10*3/uL — ABNORMAL HIGH (ref 1.5–6.5)
NEUT%: 72.9 % (ref 38.4–76.8)
Platelets: 289 10*3/uL (ref 145–400)
RBC: 4.42 10*6/uL (ref 3.70–5.45)
RDW: 13.5 % (ref 11.2–14.5)
WBC: 11.3 10*3/uL — ABNORMAL HIGH (ref 3.9–10.3)

## 2015-09-22 LAB — COMPREHENSIVE METABOLIC PANEL
ALT: 17 U/L (ref 0–55)
AST: 16 U/L (ref 5–34)
Albumin: 4.2 g/dL (ref 3.5–5.0)
Alkaline Phosphatase: 93 U/L (ref 40–150)
Anion Gap: 11 mEq/L (ref 3–11)
BUN: 22.2 mg/dL (ref 7.0–26.0)
CHLORIDE: 108 meq/L (ref 98–109)
CO2: 24 meq/L (ref 22–29)
CREATININE: 1.3 mg/dL — AB (ref 0.6–1.1)
Calcium: 9.9 mg/dL (ref 8.4–10.4)
EGFR: 50 mL/min/{1.73_m2} — ABNORMAL LOW (ref >90)
Glucose: 156 mg/dl — ABNORMAL HIGH (ref 70–140)
POTASSIUM: 4.3 meq/L (ref 3.5–5.1)
Sodium: 142 mEq/L (ref 136–145)
Total Bilirubin: 0.46 mg/dL (ref 0.20–1.20)
Total Protein: 7.7 g/dL (ref 6.4–8.3)

## 2015-09-22 MED ORDER — PEGFILGRASTIM 6 MG/0.6ML ~~LOC~~ PSKT
6.0000 mg | PREFILLED_SYRINGE | Freq: Once | SUBCUTANEOUS | Status: AC
  Administered 2015-09-22: 6 mg via SUBCUTANEOUS
  Filled 2015-09-22: qty 0.6

## 2015-09-22 MED ORDER — PALONOSETRON HCL INJECTION 0.25 MG/5ML
0.2500 mg | Freq: Once | INTRAVENOUS | Status: AC
  Administered 2015-09-22: 0.25 mg via INTRAVENOUS

## 2015-09-22 MED ORDER — TRASTUZUMAB CHEMO INJECTION 440 MG
8.0000 mg/kg | Freq: Once | INTRAVENOUS | Status: AC
  Administered 2015-09-22: 735 mg via INTRAVENOUS
  Filled 2015-09-22: qty 35

## 2015-09-22 MED ORDER — PALONOSETRON HCL INJECTION 0.25 MG/5ML
INTRAVENOUS | Status: AC
  Filled 2015-09-22: qty 5

## 2015-09-22 MED ORDER — HEPARIN SOD (PORK) LOCK FLUSH 100 UNIT/ML IV SOLN
500.0000 [IU] | Freq: Once | INTRAVENOUS | Status: AC | PRN
  Administered 2015-09-22: 500 [IU]
  Filled 2015-09-22: qty 5

## 2015-09-22 MED ORDER — SODIUM CHLORIDE 0.9 % IV SOLN
508.2000 mg | Freq: Once | INTRAVENOUS | Status: AC
  Administered 2015-09-22: 510 mg via INTRAVENOUS
  Filled 2015-09-22: qty 51

## 2015-09-22 MED ORDER — SODIUM CHLORIDE 0.9% FLUSH
10.0000 mL | INTRAVENOUS | Status: DC | PRN
  Administered 2015-09-22: 10 mL
  Filled 2015-09-22: qty 10

## 2015-09-22 MED ORDER — DIPHENHYDRAMINE HCL 25 MG PO CAPS
ORAL_CAPSULE | ORAL | Status: AC
  Filled 2015-09-22: qty 2

## 2015-09-22 MED ORDER — SODIUM CHLORIDE 0.9 % IV SOLN
10.0000 mg | Freq: Once | INTRAVENOUS | Status: AC
  Administered 2015-09-22: 10 mg via INTRAVENOUS
  Filled 2015-09-22: qty 1

## 2015-09-22 MED ORDER — SODIUM CHLORIDE 0.9 % IV SOLN
Freq: Once | INTRAVENOUS | Status: AC
  Administered 2015-09-22: 10:00:00 via INTRAVENOUS

## 2015-09-22 MED ORDER — SODIUM CHLORIDE 0.9 % IV SOLN
75.0000 mg/m2 | Freq: Once | INTRAVENOUS | Status: AC
  Administered 2015-09-22: 160 mg via INTRAVENOUS
  Filled 2015-09-22: qty 16

## 2015-09-22 MED ORDER — ACETAMINOPHEN 325 MG PO TABS
ORAL_TABLET | ORAL | Status: AC
  Filled 2015-09-22: qty 2

## 2015-09-22 MED ORDER — DIPHENHYDRAMINE HCL 25 MG PO CAPS
50.0000 mg | ORAL_CAPSULE | Freq: Once | ORAL | Status: AC
  Administered 2015-09-22: 50 mg via ORAL

## 2015-09-22 MED ORDER — ACETAMINOPHEN 325 MG PO TABS
650.0000 mg | ORAL_TABLET | Freq: Once | ORAL | Status: AC
  Administered 2015-09-22: 650 mg via ORAL

## 2015-09-22 NOTE — Telephone Encounter (Signed)
appt made and avs printed °

## 2015-09-22 NOTE — Assessment & Plan Note (Signed)
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 Adjuvant chemotherapy with [place] 2. Followed by adjuvant antiestrogen therapy with anastrozole 5 years No role of radiation since she had mastectomy. ----------------------------------------------------------------------------------------------------------------------- Current treatment: Cycle 1 day 1 TCH Blood counts were reviewed Echocardiogram was reviewed 08/25/2015: EF 65-70%  Return to clinic in 1 week for toxicity check and follow-up

## 2015-09-22 NOTE — Progress Notes (Signed)
Patient Care Team: Midge Minium, MD as PCP - General [name], MD as Consulting Physician (Cardiology) Marylynn Pearson, MD as Consulting Physician (Ophthalmology) Nicholas Lose, MD as Consulting Physician (Hematology and Oncology) Kyung Rudd, MD as Consulting Physician (Radiation Oncology) Fanny Skates, MD as Consulting Physician (General Surgery)  DIAGNOSIS: Breast cancer of lower-outer quadrant of right female breast [facility])   Staging form: Breast, AJCC 7th Edition     Clinical stage from 07/13/2015: Stage IA (T1c, N0, M0) - Unsigned   SUMMARY OF ONCOLOGIC HISTORY:   Breast cancer of lower-outer quadrant of right female breast ([place])   07/06/2015 Initial Diagnosis Right breast biopsy posterior: IDC ER 90%, PR 5%, Ki-67 60%, HER-2 positive ratio 1.42,copy #6.1 T1c N0 stage IA; Right breast biopsy inferior medial: High-grade DCIS with comedonecrosis; ER 100%, PR 90%; 5 mm calcs   07/27/2015 Procedure 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 She has healed very well from the prior surgery. She is complaining of a bloating sensation abdomen. She also has difficulty with sleeping. She has not been drinking enough water. Denies any nausea or vomiting.  REVIEW OF SYSTEMS:   Constitutional: Denies fevers, chills or abnormal weight loss Eyes: Denies blurriness of vision Ears, nose, mouth, throat, and face: Denies mucositis  or sore throat Respiratory: Denies cough, dyspnea or wheezes Cardiovascular: Denies palpitation, chest discomfort Gastrointestinal:  Denies nausea, heartburn or change in bowel habits Skin: Denies abnormal skin rashes Lymphatics: Denies new lymphadenopathy or easy bruising Neurological:Denies numbness, tingling or new weaknesses Behavioral/Psych: Mood is stable, no new changes  Extremities: No lower extremity edema Breast:  denies any pain or lumps or nodules in either breasts All other systems were reviewed with the patient and are negative.  I have reviewed the past medical history, past surgical history, social history and family history with the patient and they are unchanged from previous note.  ALLERGIES:  is allergic to contrast media; dilaudid; adhesive; codeine; and lactose intolerance (gi).  MEDICATIONS:  Current Outpatient Prescriptions  Medication Sig Dispense Refill  . albuterol (PROAIR HFA) 108 (90 BASE) MCG/ACT inhaler Inhale 2 puffs into the lungs every 6 (six) hours as needed for wheezing. 1 Inhaler 3  . ALPRAZolam (XANAX) 1 MG tablet TAKE 1 TABLET BY MOUTH TWICE DAILY AS NEEDED FOR ANXIETY 60 tablet 3  . amLODipine-valsartan (EXFORGE) 5-160 MG tablet Take 1 tablet by mouth daily. 90 tablet 1  . anastrozole (ARIMIDEX) 1 MG tablet Take 1 tablet (1 mg total) by mouth daily. (Patient not taking: Reported on 08/31/2015) 30 tablet 0  . aspirin 81 MG tablet Take 81 mg by mouth daily.    Marland Kitchen atorvastatin (LIPITOR) 20 MG tablet TAKE 1 TABLET BY MOUTH EVERY DAY 30 tablet 6  . buPROPion (WELLBUTRIN XL) 300 MG 24 hr tablet TAKE 1 TABLET BY MOUTH EVERY DAY 30 tablet 3  . cyclobenzaprine (FLEXERIL) 5 MG tablet Take 5 mg by mouth daily as needed for muscle spasms.     Marland Kitchen  dexamethasone (DECADRON) 4 MG tablet Take 1 tablet (4 mg total) by mouth 2 (two) times daily. Start the day before Taxotere. Then again the day after chemo for 1 day. (Patient not taking: Reported on 08/25/2015) 30 tablet 0    . levothyroxine (SYNTHROID, LEVOTHROID) 75 MCG tablet Take 1 tablet (75 mcg total) by mouth daily. 30 tablet 6  . lidocaine-prilocaine (EMLA) cream Apply to affected area once (Patient not taking: Reported on 08/25/2015) 30 g 3  . LORazepam (ATIVAN) 0.5 MG tablet Take 1 tablet (0.5 mg total) by mouth at bedtime. (Patient not taking: Reported on 08/25/2015) 30 tablet 0  . meclizine (ANTIVERT) 25 MG tablet Take 25 mg by mouth daily as needed for nausea.     . meloxicam (MOBIC) 15 MG tablet Take 15 mg by mouth daily.     . nitroGLYCERIN (NITROSTAT) 0.4 MG SL tablet Place 1 tablet (0.4 mg total) under the tongue every 5 (five) minutes as needed. As needed for chest pain 30 tablet 3  . ondansetron (ZOFRAN) 8 MG tablet Take 1 tablet (8 mg total) by mouth 2 (two) times daily as needed for refractory nausea / vomiting. Start on day 3 after chemo. (Patient not taking: Reported on 08/25/2015) 30 tablet 1  . Polyethyl Glycol-Propyl Glycol (SYSTANE ULTRA) 0.4-0.3 % SOLN Place 1 drop into both eyes 3 (three) times daily as needed.    . prochlorperazine (COMPAZINE) 10 MG tablet Take 1 tablet (10 mg total) by mouth every 6 (six) hours as needed (Nausea or vomiting). (Patient not taking: Reported on 08/25/2015) 30 tablet 1  . promethazine (PHENERGAN) 12.5 MG tablet Take 12.5 mg by mouth daily as needed for nausea. Reported on 08/31/2015  0  . ranitidine (ZANTAC) 150 MG tablet Take 150 mg by mouth 2 (two) times daily as needed for heartburn.     Marland Kitchen tiZANidine (ZANAFLEX) 4 MG tablet Take 4 mg by mouth every 6 (six) hours as needed for muscle spasms.    . traMADol (ULTRAM) 50 MG tablet Take 50 mg by mouth daily as needed for moderate pain.   0   No current facility-administered medications for this visit.    PHYSICAL EXAMINATION: ECOG PERFORMANCE STATUS: 1 - Symptomatic but completely ambulatory  Filed Vitals:   09/22/15 0809  BP: 121/76  Pulse: 88  Temp: 98.2 F (36.8 C)  Resp: 18   Filed Weights   09/22/15  0809  Weight: 196 lb (88.905 kg)    GENERAL:alert, no distress and comfortable SKIN: skin color, texture, turgor are normal, no rashes or significant lesions EYES: normal, Conjunctiva are pink and non-injected, sclera clear OROPHARYNX:no exudate, no erythema and lips, buccal mucosa, and tongue normal  NECK: supple, thyroid normal size, non-tender, without nodularity LYMPH:  no palpable lymphadenopathy in the cervical, axillary or inguinal LUNGS: clear to auscultation and percussion with normal breathing effort HEART: regular rate & rhythm and no murmurs and no lower extremity edema ABDOMEN:abdomen soft, non-tender and normal bowel sounds MUSCULOSKELETAL:no cyanosis of digits and no clubbing  NEURO: alert & oriented x 3 with fluent speech, no focal motor/sensory deficits EXTREMITIES: No lower extremity edema  LABORATORY DATA:  I have reviewed the data as listed   Chemistry      Component Value Date/Time   NA 142 09/22/2015 0758   NA 143 08/10/2015 0952   K 4.3 09/22/2015 0758   K 3.1* 08/10/2015 0952   CL 107 08/10/2015 0952   CO2 24 09/22/2015 0758   CO2 27 08/10/2015  0952   BUN 22.2 09/22/2015 0758   BUN 8 08/10/2015 0952   CREATININE 1.3* 09/22/2015 0758   CREATININE 0.91 08/10/2015 0952   CREATININE 0.79 05/21/2014 1610      Component Value Date/Time   CALCIUM 9.9 09/22/2015 0758   CALCIUM 9.6 08/10/2015 0952   ALKPHOS 93 09/22/2015 0758   ALKPHOS 86 08/10/2015 0952   AST 16 09/22/2015 0758   AST 25 08/10/2015 0952   ALT 17 09/22/2015 0758   ALT 24 08/10/2015 0952   BILITOT 0.46 09/22/2015 0758   BILITOT 0.8 08/10/2015 0952       Lab Results  Component Value Date   WBC 11.3* 09/22/2015   HGB 13.5 09/22/2015   HCT 40.1 09/22/2015   MCV 90.7 09/22/2015   PLT 289 09/22/2015   NEUTROABS 8.2* 09/22/2015   ASSESSMENT & PLAN:  Breast cancer of lower-outer quadrant of right female breast ([place]) Right mastectomy 08/15/2015: IDC grade 2, 2.2 cm, with associated  DCIS intermediate grade, separate focus high-grade DCIS, 0/4 lymph nodes negative, T2 N0 stage II a, ER 90%, PR 5%, HER-2 negative duration 1.42, Ki-67 60%  Treatment plan: 1. Adjuvant chemotherapy with [place] 2. Followed by adjuvant antiestrogen therapy with anastrozole 5 years No role of radiation since she had mastectomy. ----------------------------------------------------------------------------------------------------------------------- Current treatment: Cycle 1 day 1 TCH Blood counts were reviewed Echocardiogram was reviewed 08/25/2015: EF 65-70%  Return to clinic in 1 week for toxicity check and follow-up   No orders of the defined types were placed in this encounter.   The patient has a good understanding of the overall plan. she agrees with it. she will call with any problems that may develop before the next visit here.   Rulon Eisenmenger, MD 09/22/2015

## 2015-09-22 NOTE — Patient Instructions (Signed)
[facility] Discharge Instructions for Patients Receiving Chemotherapy  Today you received the following chemotherapy agents Herceptin, taxotere, carboplatin  To help prevent nausea and vomiting after your treatment, we encourage you to take your nausea medication as directed  If you develop nausea and vomiting that is not controlled by your nausea medication, call the clinic.   BELOW ARE SYMPTOMS THAT SHOULD BE REPORTED IMMEDIATELY:  *FEVER GREATER THAN 100.5 F  *CHILLS WITH OR WITHOUT FEVER  NAUSEA AND VOMITING THAT IS NOT CONTROLLED WITH YOUR NAUSEA MEDICATION  *UNUSUAL SHORTNESS OF BREATH  *UNUSUAL BRUISING OR BLEEDING  TENDERNESS IN MOUTH AND THROAT WITH OR WITHOUT PRESENCE OF ULCERS  *URINARY PROBLEMS  *BOWEL PROBLEMS  UNUSUAL RASH Items with * indicate a potential emergency and should be followed up as soon as possible.  Feel free to call the clinic you have any questions or concerns. The clinic phone number is [phone].  Please show the [facility] at check-in to the Emergency Department and triage nurse.

## 2015-09-22 NOTE — Progress Notes (Signed)
Cbc/cmet reviewed with Dr. Lindi Adie ok to treat with creatinine 1.3

## 2015-09-27 ENCOUNTER — Encounter (HOSPITAL_COMMUNITY): Payer: Self-pay | Admitting: *Deleted

## 2015-09-27 ENCOUNTER — Emergency Department (HOSPITAL_COMMUNITY): Payer: Medicare Other

## 2015-09-27 ENCOUNTER — Emergency Department (HOSPITAL_COMMUNITY)
Admission: EM | Admit: 2015-09-27 | Discharge: 2015-09-27 | Disposition: A | Discharge status: Home / Self Care | Payer: Medicare Other | Attending: Emergency Medicine | Admitting: Emergency Medicine

## 2015-09-27 ENCOUNTER — Ambulatory Visit: Payer: Medicare Other

## 2015-09-27 ENCOUNTER — Other Ambulatory Visit: Payer: Self-pay

## 2015-09-27 ENCOUNTER — Telehealth (HOSPITAL_BASED_OUTPATIENT_CLINIC_OR_DEPARTMENT_OTHER): Payer: Self-pay | Admitting: Emergency Medicine

## 2015-09-27 DIAGNOSIS — E876 Hypokalemia: Secondary | ICD-10-CM | POA: Diagnosis not present

## 2015-09-27 DIAGNOSIS — E039 Hypothyroidism, unspecified: Secondary | ICD-10-CM | POA: Diagnosis not present

## 2015-09-27 DIAGNOSIS — R1032 Left lower quadrant pain: Secondary | ICD-10-CM | POA: Diagnosis not present

## 2015-09-27 DIAGNOSIS — M1990 Unspecified osteoarthritis, unspecified site: Secondary | ICD-10-CM | POA: Diagnosis not present

## 2015-09-27 DIAGNOSIS — Z79899 Other long term (current) drug therapy: Secondary | ICD-10-CM | POA: Insufficient documentation

## 2015-09-27 DIAGNOSIS — Z7982 Long term (current) use of aspirin: Secondary | ICD-10-CM | POA: Insufficient documentation

## 2015-09-27 DIAGNOSIS — E860 Dehydration: Secondary | ICD-10-CM | POA: Diagnosis not present

## 2015-09-27 DIAGNOSIS — R404 Transient alteration of awareness: Secondary | ICD-10-CM | POA: Diagnosis not present

## 2015-09-27 DIAGNOSIS — I10 Essential (primary) hypertension: Secondary | ICD-10-CM | POA: Diagnosis not present

## 2015-09-27 DIAGNOSIS — E785 Hyperlipidemia, unspecified: Secondary | ICD-10-CM | POA: Insufficient documentation

## 2015-09-27 DIAGNOSIS — R55 Syncope and collapse: Secondary | ICD-10-CM

## 2015-09-27 DIAGNOSIS — E119 Type 2 diabetes mellitus without complications: Secondary | ICD-10-CM | POA: Diagnosis not present

## 2015-09-27 DIAGNOSIS — Z853 Personal history of malignant neoplasm of breast: Secondary | ICD-10-CM | POA: Insufficient documentation

## 2015-09-27 DIAGNOSIS — J45909 Unspecified asthma, uncomplicated: Secondary | ICD-10-CM | POA: Insufficient documentation

## 2015-09-27 DIAGNOSIS — F329 Major depressive disorder, single episode, unspecified: Secondary | ICD-10-CM | POA: Diagnosis not present

## 2015-09-27 LAB — URINALYSIS, ROUTINE W REFLEX MICROSCOPIC
BILIRUBIN URINE: NEGATIVE
Glucose, UA: NEGATIVE mg/dL
Hgb urine dipstick: NEGATIVE
KETONES UR: NEGATIVE mg/dL
LEUKOCYTES UA: NEGATIVE
NITRITE: NEGATIVE
PH: 5.5 (ref 5.0–8.0)
PROTEIN: NEGATIVE mg/dL
Specific Gravity, Urine: 1.004 — ABNORMAL LOW (ref 1.005–1.030)

## 2015-09-27 LAB — COMPREHENSIVE METABOLIC PANEL
ALT: 15 U/L (ref 14–54)
ANION GAP: 6 (ref 5–15)
AST: 15 U/L (ref 15–41)
Albumin: 3 g/dL — ABNORMAL LOW (ref 3.5–5.0)
Alkaline Phosphatase: 61 U/L (ref 38–126)
BILIRUBIN TOTAL: 1.1 mg/dL (ref 0.3–1.2)
BUN: 15 mg/dL (ref 6–20)
CHLORIDE: 107 mmol/L (ref 101–111)
CO2: 25 mmol/L (ref 22–32)
Calcium: 7.2 mg/dL — ABNORMAL LOW (ref 8.9–10.3)
Creatinine, Ser: 1.03 mg/dL — ABNORMAL HIGH (ref 0.44–1.00)
GFR, EST NON AFRICAN AMERICAN: 54 mL/min — AB (ref >60)
Glucose, Bld: 117 mg/dL — ABNORMAL HIGH (ref 65–99)
POTASSIUM: 2.7 mmol/L — AB (ref 3.5–5.1)
Sodium: 138 mmol/L (ref 135–145)
TOTAL PROTEIN: 5.2 g/dL — AB (ref 6.5–8.1)

## 2015-09-27 LAB — CBC WITH DIFFERENTIAL/PLATELET
BASOS PCT: 1 %
Basophils Absolute: 0 10*3/uL (ref 0.0–0.1)
EOS PCT: 2 %
Eosinophils Absolute: 0 10*3/uL (ref 0.0–0.7)
HEMATOCRIT: 31.8 % — AB (ref 36.0–46.0)
Hemoglobin: 10.8 g/dL — ABNORMAL LOW (ref 12.0–15.0)
LYMPHS ABS: 0.7 10*3/uL (ref 0.7–4.0)
Lymphocytes Relative: 31 %
MCH: 29.9 pg (ref 26.0–34.0)
MCHC: 34 g/dL (ref 30.0–36.0)
MCV: 88.1 fL (ref 78.0–100.0)
MONO ABS: 0.1 10*3/uL (ref 0.1–1.0)
MONOS PCT: 4 %
NEUTROS ABS: 1.6 10*3/uL — AB (ref 1.7–7.7)
NEUTROS PCT: 62 %
PLATELETS: 157 10*3/uL (ref 150–400)
RBC: 3.61 MIL/uL — AB (ref 3.87–5.11)
RDW: 13.2 % (ref 11.5–15.5)
WBC: 2.4 10*3/uL — AB (ref 4.0–10.5)

## 2015-09-27 LAB — TROPONIN I

## 2015-09-27 LAB — LIPASE, BLOOD: LIPASE: 11 U/L (ref 11–51)

## 2015-09-27 LAB — MAGNESIUM: Magnesium: 1.5 mg/dL — ABNORMAL LOW (ref 1.7–2.4)

## 2015-09-27 MED ORDER — MAGNESIUM SULFATE 2 GM/50ML IV SOLN
2.0000 g | Freq: Once | INTRAVENOUS | Status: AC
  Administered 2015-09-27: 2 g via INTRAVENOUS
  Filled 2015-09-27: qty 50

## 2015-09-27 MED ORDER — LOPERAMIDE HCL 2 MG PO CAPS
2.0000 mg | ORAL_CAPSULE | Freq: Once | ORAL | Status: AC
  Administered 2015-09-27: 2 mg via ORAL
  Filled 2015-09-27: qty 1

## 2015-09-27 MED ORDER — BARIUM SULFATE 2.1 % PO SUSP: 450.0000 mL | ORAL | Status: DC | PRN

## 2015-09-27 MED ORDER — POTASSIUM CHLORIDE CRYS ER 20 MEQ PO TBCR
40.0000 meq | EXTENDED_RELEASE_TABLET | Freq: Once | ORAL | Status: AC
  Administered 2015-09-27: 40 meq via ORAL
  Filled 2015-09-27: qty 2

## 2015-09-27 MED ORDER — HEPARIN SOD (PORK) LOCK FLUSH 100 UNIT/ML IV SOLN
500.0000 [IU] | Freq: Once | INTRAVENOUS | Status: AC
  Administered 2015-09-27: 500 [IU]
  Filled 2015-09-27: qty 5

## 2015-09-27 MED ORDER — POTASSIUM CHLORIDE 10 MEQ/100ML IV SOLN
10.0000 meq | Freq: Once | INTRAVENOUS | Status: AC
  Administered 2015-09-27: 10 meq via INTRAVENOUS
  Filled 2015-09-27: qty 100

## 2015-09-27 MED ORDER — MAGNESIUM SULFATE 50 % IJ SOLN: 2.0000 g | Freq: Once | INTRAMUSCULAR | Status: DC

## 2015-09-27 MED ORDER — ONDANSETRON HCL 4 MG/2ML IJ SOLN
4.0000 mg | Freq: Once | INTRAMUSCULAR | Status: AC
  Administered 2015-09-27: 4 mg via INTRAVENOUS
  Filled 2015-09-27: qty 2

## 2015-09-27 MED ORDER — SODIUM CHLORIDE 0.9 % IV BOLUS (SEPSIS)
1000.0000 mL | Freq: Once | INTRAVENOUS | Status: AC
  Administered 2015-09-27: 1000 mL via INTRAVENOUS

## 2015-09-27 NOTE — ED Notes (Signed)
Upon discharging patient, patient stood up and had a large episode of diarrhea that she states she was unaware was coming. MD notified, giving imodium. Pt states she doesn't want to be admitted

## 2015-09-27 NOTE — ED Notes (Addendum)
Patient ambulated with steady gait and without difficulty through the hallway. Notified MD. Also tolerating PO potassium and fluids

## 2015-09-27 NOTE — ED Provider Notes (Signed)
I attempted to call in Rx for potassium 67meq PO daily for 5 days but no answer I have asked flow manager to call patient to call in to pharmacy Pt does have f/u later this week with oncologist   Ripley Fraise, MD 09/27/15 [phone]

## 2015-09-27 NOTE — ED Notes (Signed)
Pt given blue scrubs, yellow sock, mesh panties and a pad.  Pt remains adamant that she does not want to be admitted.  Pt verbalized understanding of d/c paperwork and had not further questions.

## 2015-09-27 NOTE — ED Provider Notes (Signed)
Pt had bowel movement in the room prior to d/c She just started taking PO and this caused her to have diarrhea meds ordered Offered admission but pt declines   Ripley Fraise, MD 09/27/15 1341

## 2015-09-27 NOTE — ED Provider Notes (Signed)
CSN: [id number]     Arrival date & time 09/27/15  0615 History   First MD Initiated Contact with Patient 09/27/15 0701     Chief Complaint - syncope   Patient is a 70 y.o. female presenting with syncope. The history is provided by the patient and a relative.  Loss of Consciousness Episode history:  Single Most recent episode:  Today Timing:  Constant Progression:  Improving Chronicity:  New Relieved by:  Lying down Exacerbated by: standing. Associated symptoms: nausea and vomiting   Associated symptoms: no chest pain, no difficulty breathing, no fever, no focal weakness, no rectal bleeding, no seizures and no shortness of breath   Risk factors comment:  Breast cancer, on chemotherapy Patient is a 70 yo female with h/o breast cancer, hypertension, hyperlipidemia who presents with syncopal event She woke up this morning, and soon after going to the restroom she felt lightheaded and had syncopal event Family member reports he heard a "boom" and found her in the floor No seizure activity reported He reports she was awake but was mildly confused but this resolved She did hit her head but denies significant HA  Prior to LOC - no CP/SOB/HA She has had vomiting/diarrhea everyday since starting chemotherapy for breast CA last week She reports everytime she eats she has diarrhea She has some abdominal cramping   Past Medical History  Diagnosis Date  . GERD (gastroesophageal reflux disease)   . Hyperlipidemia   . Hypertension   . Depression   . Fibromyalgia   . Peptic ulcer disease   . Hypothyroidism   . Chronic lower back pain   . Asthma   . Diverticular disease   . Binge eating disorder   . Costochondritis   . Anxiety   . Hot flashes   . Shortness of breath dyspnea   . Complication of anesthesia     had bronc spasms during intubation surgery 2009 on foot-need albuterol inhaler or neb tx preop  . Angina at rest [facility])     "occurs whenever it wants to, but worse during  agitation"  . Chronic bronchitis ([place])   . Type II diabetes mellitus ([place])     "diet controlled" (08/15/2015)  . Osteoarthritis     "all over"  . Breast cancer of lower-outer quadrant of left female breast ([place]) 07/08/2015    "NEVER HAD LEFT BREAST CANCER" (08/15/2015)  . Cancer of right breast ([place]) 08/15/2015   Past Surgical History  Procedure Laterality Date  . Tonsillectomy    . Shoulder arthroscopy Bilateral 2009-2011    "bone spurs"  . Colonoscopy    . Ankle fracture surgery Right 2009    Dr. Rolena Infante  . Ankle arthroscopy  01/02/2012    Procedure: ANKLE ARTHROSCOPY;  Surgeon: Colin Rhein, MD;  Location: [place];  Service: Orthopedics;  Laterality: Right;  right ankle arthroscopy with extensive debridement , dridement and drilling talar dome osteochondral lesion  . Knee arthroscopy Bilateral   . Carpal tunnel release Bilateral   . Cardiac catheterization    . Mastectomy complete / simple w/ sentinel node biopsy Right 08/15/2015  . Laparoscopic cholecystectomy    . Fracture surgery    . Dilation and curettage of uterus      "before hysterectomy"  . Abdominal hysterectomy      "left my ovaries"  . Portacath placement Left 08/15/2015  . Breast biopsy  07/2015  . Mastectomy w/ sentinel node biopsy Right 08/15/2015    Procedure: TOTAL MASTECTOMY WITH  SENTINEL LYMPH NODE BIOPSY AND BLUE DYE INJECTION ;  Surgeon: Fanny Skates, MD;  Location: [place];  Service: General;  Laterality: Right;  . Portacath placement Left 08/15/2015    Procedure: INSERTION PORT-A-CATH ;  Surgeon: Fanny Skates, MD;  Location: [place];  Service: General;  Laterality: Left;   Family History  Problem Relation Age of Onset  . Dementia Mother   . Stroke Mother   . Heart Problems Mother   . Dementia Father   . Alcohol abuse Father   . Colon cancer Father     dx. 92 or younger  . Breast cancer Sister 8    inflammatory  . Diverticulitis Sister     maternal half-sister; severe - causing partial  colectomy  . Breast cancer Sister     maternal half-sister; dx. early 81s  . Breast cancer Other 68    niece  . Breast cancer Other     niece dx. 57s  . Alcohol abuse Brother    Social History  Substance Use Topics  . Smoking status: Never Smoker   . Smokeless tobacco: Never Used  . Alcohol Use: 0.0 oz/week    0 Standard drinks or equivalent per week     Comment: 08/15/2015 "1/2 glass of wine q 2 months"    OB History    No data available     Review of Systems  Constitutional: Positive for fatigue. Negative for fever.  Respiratory: Negative for shortness of breath.   Cardiovascular: Positive for syncope. Negative for chest pain.  Gastrointestinal: Positive for nausea, vomiting, abdominal pain and diarrhea. Negative for blood in stool and anal bleeding.  Genitourinary: Positive for frequency.  Musculoskeletal: Negative for back pain and neck pain.  Neurological: Positive for syncope. Negative for focal weakness and seizures.  All other systems reviewed and are negative.     Allergies  Contrast media; Dilaudid; Adhesive; Codeine; and Lactose intolerance (gi)  Home Medications   Prior to Admission medications   Medication Sig Start Date End Date Taking? Authorizing Provider  albuterol (PROAIR HFA) 108 (90 BASE) MCG/ACT inhaler Inhale 2 puffs into the lungs every 6 (six) hours as needed for wheezing. 08/30/14   Midge Minium, MD  ALPRAZolam Duanne Moron) 1 MG tablet TAKE 1 TABLET BY MOUTH TWICE DAILY AS NEEDED FOR ANXIETY 05/23/15   Midge Minium, MD  amLODipine-valsartan (EXFORGE) 5-160 MG tablet Take 1 tablet by mouth daily. 06/02/15   Midge Minium, MD  anastrozole (ARIMIDEX) 1 MG tablet Take 1 tablet (1 mg total) by mouth daily. Patient not taking: Reported on 08/31/2015 07/13/15   Nicholas Lose, MD  aspirin 81 MG tablet Take 81 mg by mouth daily.    Historical Provider, MD  atorvastatin (LIPITOR) 20 MG tablet TAKE 1 TABLET BY MOUTH EVERY DAY 03/07/15   Midge Minium, MD  buPROPion (WELLBUTRIN XL) 300 MG 24 hr tablet TAKE 1 TABLET BY MOUTH EVERY DAY 06/06/15   Midge Minium, MD  cyclobenzaprine (FLEXERIL) 5 MG tablet Take 5 mg by mouth daily as needed for muscle spasms.     Historical Provider, MD  dexamethasone (DECADRON) 4 MG tablet Take 1 tablet (4 mg total) by mouth 2 (two) times daily. Start the day before Taxotere. Then again the day after chemo for 1 day. Patient not taking: Reported on 08/25/2015 08/24/15   Nicholas Lose, MD  levothyroxine (SYNTHROID, LEVOTHROID) 75 MCG tablet Take 1 tablet (75 mcg total) by mouth daily. 09/07/15   Midge Minium,  MD  lidocaine-prilocaine (EMLA) cream Apply to affected area once Patient not taking: Reported on 08/25/2015 08/24/15   Nicholas Lose, MD  LORazepam (ATIVAN) 0.5 MG tablet Take 1 tablet (0.5 mg total) by mouth at bedtime. Patient not taking: Reported on 08/25/2015 08/24/15   Nicholas Lose, MD  meclizine (ANTIVERT) 25 MG tablet Take 25 mg by mouth daily as needed for nausea.  05/12/12   Historical Provider, MD  meloxicam (MOBIC) 15 MG tablet Take 15 mg by mouth daily.  06/04/15   Historical Provider, MD  nitroGLYCERIN (NITROSTAT) 0.4 MG SL tablet Place 1 tablet (0.4 mg total) under the tongue every 5 (five) minutes as needed. As needed for chest pain 11/18/14   Midge Minium, MD  ondansetron (ZOFRAN) 8 MG tablet Take 1 tablet (8 mg total) by mouth 2 (two) times daily as needed for refractory nausea / vomiting. Start on day 3 after chemo. Patient not taking: Reported on 08/25/2015 08/24/15   Nicholas Lose, MD  Polyethyl Glycol-Propyl Glycol (SYSTANE ULTRA) 0.4-0.3 % SOLN Place 1 drop into both eyes 3 (three) times daily as needed.    Historical Provider, MD  prochlorperazine (COMPAZINE) 10 MG tablet Take 1 tablet (10 mg total) by mouth every 6 (six) hours as needed (Nausea or vomiting). Patient not taking: Reported on 08/25/2015 08/24/15   Nicholas Lose, MD  promethazine (PHENERGAN) 12.5 MG tablet Take 12.5 mg  by mouth daily as needed for nausea. Reported on 08/31/2015 08/03/14   Historical Provider, MD  ranitidine (ZANTAC) 150 MG tablet Take 150 mg by mouth 2 (two) times daily as needed for heartburn.     Historical Provider, MD  tiZANidine (ZANAFLEX) 4 MG tablet Take 4 mg by mouth every 6 (six) hours as needed for muscle spasms.    Historical Provider, MD  traMADol (ULTRAM) 50 MG tablet Take 50 mg by mouth daily as needed for moderate pain.  04/08/15   Historical Provider, MD   BP 113/82 mmHg  Pulse 70  Temp(Src) 97.5 F (36.4 C) (Oral)  Resp 18  SpO2 95% Physical Exam CONSTITUTIONAL: Well developed/well nourished HEAD: Normocephalic/atraumatic, no lacerations or visible trauma EYES: EOMI/PERRL ENMT: Mucous membranes dry NECK: supple no meningeal signs SPINE/BACK:entire spine nontender, No bruising/crepitance/stepoffs noted to spine CV: S1/S2 noted, no murmurs/rubs/gallops noted LUNGS: Lungs are clear to auscultation bilaterally, no apparent distress ABDOMEN: soft, mild LLQ tenderness, no rebound or guarding, bowel sounds noted throughout abdomen GU:no cva tenderness NEURO: Pt is awake/alert/appropriate, moves all extremitiesx4.  No facial droop.   EXTREMITIES: pulses normal/equal, full ROM, All other extremities/joints palpated/ranged and nontender SKIN: warm, color normal PSYCH: no abnormalities of mood noted, alert and oriented to situation  ED Course  Procedures  7:30 AM Pt with syncopal event after having N/V/D for several days since starting chemo Initial EMS BP was 88 systolic This is improving with IV fluids Labs pending Will need abdominal exam reassessment   12:45 PM Pt had worsening abd pain Ct imaging performed and negative Pt improved Taking PO fluids Ambulatory hypoKalemia treated with KCL/magnesium (no EKG changes) Likely had vasovagal syncope Recent echo unremarkable per EPIC records Suspect this was precipitated by recent volume loss from chemo induced  diarrhea Pt agreeable with plan  Labs Review Labs Reviewed  CBC WITH DIFFERENTIAL/PLATELET - Abnormal; Notable for the following:    WBC 2.4 (*)    RBC 3.61 (*)    Hemoglobin 10.8 (*)    HCT 31.8 (*)    Neutro Abs 1.6 (*)  All other components within normal limits  COMPREHENSIVE METABOLIC PANEL - Abnormal; Notable for the following:    Potassium 2.7 (*)    Glucose, Bld 117 (*)    Creatinine, Ser 1.03 (*)    Calcium 7.2 (*)    Total Protein 5.2 (*)    Albumin 3.0 (*)    GFR calc non Af Amer 54 (*)    All other components within normal limits  URINALYSIS, ROUTINE W REFLEX MICROSCOPIC (NOT AT [facility]) - Abnormal; Notable for the following:    Specific Gravity, Urine 1.004 (*)    All other components within normal limits  MAGNESIUM - Abnormal; Notable for the following:    Magnesium 1.5 (*)    All other components within normal limits  TROPONIN I  LIPASE, BLOOD    Imaging Review Ct Abdomen Pelvis Wo Contrast  09/27/2015  CLINICAL DATA:  Left lower quadrant pain beginning this morning. The patient is currently receiving chemotherapy for breast carcinoma. EXAM: CT ABDOMEN AND PELVIS WITHOUT CONTRAST TECHNIQUE: Multidetector CT imaging of the abdomen and pelvis was performed following the standard protocol without IV contrast. COMPARISON:  CT abdomen and pelvis 01/21/2012. FINDINGS: Lung bases are clear. No pleural or pericardial effusion. The patient is status post right mastectomy. The liver, spleen, pancreas and adrenal glands appear normal. No renal or ureteral stones are identified. Small cyst in the upper pole of the left kidney is noted. There is also small cyst in the lower pole of the right kidney. The patient is status post cholecystectomy. The uterus has been removed. Extensive colonic diverticulosis is identified but no evidence of diverticulitis is seen. The stomach and small bowel appear normal. Scattered aortoiliac atherosclerosis without aneurysm is noted. There is no  lymphadenopathy or fluid. No lytic or sclerotic bony lesion is identified. There is some degenerative change about the hips and lower lumbar spine. IMPRESSION: No acute abnormality abdomen or pelvis. Extensive diverticulosis without diverticulitis. Aortoiliac atherosclerosis without aneurysm. Status post right mastectomy, cholecystectomy and hysterectomy. Electronically Signed   By: Inge Rise M.D.   On: 09/27/2015 11:03   I have personally reviewed and evaluated these lab results as part of my medical decision-making.   EKG Interpretation   Date/Time:  Tuesday September 27 2015 06:25:19 EDT Ventricular Rate:  72 PR Interval:    QRS Duration: 93 QT Interval:  406 QTC Calculation: 445 R Axis:   43 Text Interpretation:  Sinus rhythm Abnormal R-wave progression, early  transition No significant change since last tracing Confirmed by WARD,   DO, KRISTEN [phone]) on 09/27/2015 6:47:07 AM     Medications  Barium Sulfate 2.1 % SUSP 450 mL (not administered)  sodium chloride 0.9 % bolus 1,000 mL (0 mLs Intravenous Stopped 09/27/15 0748)  ondansetron (ZOFRAN) injection 4 mg (4 mg Intravenous Given 09/27/15 0640)  sodium chloride 0.9 % bolus 1,000 mL (0 mLs Intravenous Stopped 09/27/15 1154)  potassium chloride 10 mEq in 100 mL IVPB (0 mEq Intravenous Stopped 09/27/15 0943)  magnesium sulfate IVPB 2 g 50 mL (0 g Intravenous Stopped 09/27/15 1154)  potassium chloride SA (K-DUR,KLOR-CON) CR tablet 40 mEq (40 mEq Oral Given 09/27/15 1155)    MDM   Final diagnoses:  Vasovagal syncope  Hypokalemia  Dehydration  Left lower quadrant pain    Nursing notes including past medical history and social history reviewed and considered in documentation Labs/vital reviewed myself and considered during evaluation Previous records reviewed and considered     Ripley Fraise, MD 09/27/15 1247

## 2015-09-27 NOTE — Telephone Encounter (Signed)
Telephone order for KCL 20 mEQ po daily x 5 days #5 no refills DR. Wickline, called to [facility]

## 2015-09-27 NOTE — ED Notes (Signed)
Bed: [id number] Expected date:  Expected time:  Means of arrival:  Comments: Cancer pt, syncope

## 2015-09-27 NOTE — ED Notes (Signed)
Pt is a receiving chemo and is having n/v/d. Pt was found to be hypotensive with EMS. Pt had syncopal episode. EMS states BP was 88/50 lying.

## 2015-09-28 DIAGNOSIS — E785 Hyperlipidemia, unspecified: Secondary | ICD-10-CM | POA: Diagnosis not present

## 2015-09-28 DIAGNOSIS — R55 Syncope and collapse: Secondary | ICD-10-CM | POA: Diagnosis not present

## 2015-09-28 DIAGNOSIS — I10 Essential (primary) hypertension: Secondary | ICD-10-CM | POA: Diagnosis not present

## 2015-09-28 DIAGNOSIS — M797 Fibromyalgia: Secondary | ICD-10-CM | POA: Diagnosis not present

## 2015-09-28 DIAGNOSIS — E039 Hypothyroidism, unspecified: Secondary | ICD-10-CM | POA: Diagnosis not present

## 2015-09-28 DIAGNOSIS — I951 Orthostatic hypotension: Secondary | ICD-10-CM | POA: Diagnosis not present

## 2015-09-28 DIAGNOSIS — E669 Obesity, unspecified: Secondary | ICD-10-CM | POA: Diagnosis not present

## 2015-09-28 DIAGNOSIS — I2510 Atherosclerotic heart disease of native coronary artery without angina pectoris: Secondary | ICD-10-CM | POA: Diagnosis not present

## 2015-09-30 ENCOUNTER — Other Ambulatory Visit (HOSPITAL_BASED_OUTPATIENT_CLINIC_OR_DEPARTMENT_OTHER): Payer: Medicare Other

## 2015-09-30 ENCOUNTER — Ambulatory Visit (HOSPITAL_BASED_OUTPATIENT_CLINIC_OR_DEPARTMENT_OTHER): Payer: Medicare Other | Admitting: Hematology and Oncology

## 2015-09-30 ENCOUNTER — Encounter: Payer: Self-pay | Admitting: Hematology and Oncology

## 2015-09-30 ENCOUNTER — Ambulatory Visit: Payer: Medicare Other | Admitting: Physical Therapy

## 2015-09-30 ENCOUNTER — Encounter: Payer: Self-pay | Admitting: *Deleted

## 2015-09-30 ENCOUNTER — Other Ambulatory Visit: Payer: Self-pay | Admitting: *Deleted

## 2015-09-30 VITALS — BP 112/72 | HR 85 | Temp 98.5°F | Resp 18 | Ht 66.0 in | Wt 193.9 lb

## 2015-09-30 DIAGNOSIS — Z483 Aftercare following surgery for neoplasm: Secondary | ICD-10-CM | POA: Diagnosis not present

## 2015-09-30 DIAGNOSIS — M25511 Pain in right shoulder: Secondary | ICD-10-CM | POA: Diagnosis not present

## 2015-09-30 DIAGNOSIS — C50511 Malignant neoplasm of lower-outer quadrant of right female breast: Secondary | ICD-10-CM

## 2015-09-30 DIAGNOSIS — M25611 Stiffness of right shoulder, not elsewhere classified: Secondary | ICD-10-CM

## 2015-09-30 DIAGNOSIS — R222 Localized swelling, mass and lump, trunk: Secondary | ICD-10-CM | POA: Diagnosis not present

## 2015-09-30 DIAGNOSIS — R293 Abnormal posture: Secondary | ICD-10-CM | POA: Diagnosis not present

## 2015-09-30 DIAGNOSIS — Z170 Estrogen receptor positive status [ER+]: Secondary | ICD-10-CM

## 2015-09-30 LAB — CBC WITH DIFFERENTIAL/PLATELET
BASO%: 0.2 % (ref 0.0–2.0)
Basophils Absolute: 0.1 10*3/uL (ref 0.0–0.1)
EOS%: 0.3 % (ref 0.0–7.0)
Eosinophils Absolute: 0.1 10*3/uL (ref 0.0–0.5)
HEMATOCRIT: 36 % (ref 34.8–46.6)
HEMOGLOBIN: 11.5 g/dL — AB (ref 11.6–15.9)
LYMPH#: 3.7 10*3/uL — AB (ref 0.9–3.3)
LYMPH%: 12.9 % — ABNORMAL LOW (ref 14.0–49.7)
MCH: 29.2 pg (ref 25.1–34.0)
MCHC: 31.9 g/dL (ref 31.5–36.0)
MCV: 91.4 fL (ref 79.5–101.0)
MONO#: 1.9 10*3/uL — AB (ref 0.1–0.9)
MONO%: 6.6 % (ref 0.0–14.0)
NEUT%: 80 % — ABNORMAL HIGH (ref 38.4–76.8)
NEUTROS ABS: 23.1 10*3/uL — AB (ref 1.5–6.5)
PLATELETS: 239 10*3/uL (ref 145–400)
RBC: 3.94 10*6/uL (ref 3.70–5.45)
RDW: 13.8 % (ref 11.2–14.5)
WBC: 28.9 10*3/uL — AB (ref 3.9–10.3)

## 2015-09-30 LAB — COMPREHENSIVE METABOLIC PANEL
ALT: 18 U/L (ref 0–55)
ANION GAP: 7 meq/L (ref 3–11)
AST: 19 U/L (ref 5–34)
Albumin: 3.4 g/dL — ABNORMAL LOW (ref 3.5–5.0)
Alkaline Phosphatase: 93 U/L (ref 40–150)
BILIRUBIN TOTAL: 0.43 mg/dL (ref 0.20–1.20)
BUN: 9.2 mg/dL (ref 7.0–26.0)
CALCIUM: 9.1 mg/dL (ref 8.4–10.4)
CO2: 28 mEq/L (ref 22–29)
CREATININE: 1.4 mg/dL — AB (ref 0.6–1.1)
Chloride: 104 mEq/L (ref 98–109)
EGFR: 44 mL/min/{1.73_m2} — ABNORMAL LOW (ref >90)
Glucose: 129 mg/dl (ref 70–140)
Potassium: 4.3 mEq/L (ref 3.5–5.1)
Sodium: 140 mEq/L (ref 136–145)
TOTAL PROTEIN: 6.4 g/dL (ref 6.4–8.3)

## 2015-09-30 MED ORDER — AMLODIPINE BESYLATE-VALSARTAN 5-160 MG PO TABS: 0.5000 | ORAL_TABLET | Freq: Every day | ORAL | Status: DC

## 2015-09-30 MED ORDER — MAGIC MOUTHWASH W/LIDOCAINE: ORAL | Status: DC

## 2015-09-30 NOTE — Assessment & Plan Note (Signed)
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 Adjuvant chemotherapy with [place] 2. Followed by adjuvant antiestrogen therapy with anastrozole 5 years No role of radiation since she had mastectomy. ----------------------------------------------------------------------------------------------------------------------- Current treatment: Cycle 1 day 8 TCH Blood counts were reviewed Echocardiogram was reviewed 08/25/2015: EF 65-70%  Chemotherapy toxicities: 1. Syncope: Patient went to the emergency room and was discharged home  Return to clinic in 2 week for cycle 2 chemotherapy.

## 2015-09-30 NOTE — Therapy (Signed)
[place], [place], [address] Phone: [phone]   Fax:  [phone]  Physical Therapy Treatment  Patient Details  Name: Tracey Evans MRN: [id number] Date of Birth: 11-30-1945 Referring Provider: Dr. Brion Aliment  Encounter Date: 09/30/2015      PT End of Session - 09/30/15 1230    Visit Number 6   Number of Visits 13   Date for PT Re-Evaluation 10/14/15   PT Start Time 0847   PT Stop Time 0932   PT Time Calculation (min) 45 min   Activity Tolerance Patient tolerated treatment well   Behavior During Therapy [facility] for tasks assessed/performed      Past Medical History  Diagnosis Date  . GERD (gastroesophageal reflux disease)   . Hyperlipidemia   . Hypertension   . Depression   . Fibromyalgia   . Peptic ulcer disease   . Hypothyroidism   . Chronic lower back pain   . Asthma   . Diverticular disease   . Binge eating disorder   . Costochondritis   . Anxiety   . Hot flashes   . Shortness of breath dyspnea   . Complication of anesthesia     had bronc spasms during intubation surgery 2009 on foot-need albuterol inhaler or neb tx preop  . Angina at rest [facility])     "occurs whenever it wants to, but worse during agitation"  . Chronic bronchitis ([place])   . Type II diabetes mellitus ([place])     "diet controlled" (08/15/2015)  . Osteoarthritis     "all over"  . Breast cancer of lower-outer quadrant of left female breast ([place]) 07/08/2015    "NEVER HAD LEFT BREAST CANCER" (08/15/2015)  . Cancer of right breast ([place]) 08/15/2015    Past Surgical History  Procedure Laterality Date  . Tonsillectomy    . Shoulder arthroscopy Bilateral 2009-2011    "bone spurs"  . Colonoscopy    . Ankle fracture surgery Right 2009    Dr. Rolena Infante  . Ankle arthroscopy  01/02/2012    Procedure: ANKLE ARTHROSCOPY;  Surgeon: Colin Rhein, MD;  Location: [place];  Service: Orthopedics;  Laterality:  Right;  right ankle arthroscopy with extensive debridement , dridement and drilling talar dome osteochondral lesion  . Knee arthroscopy Bilateral   . Carpal tunnel release Bilateral   . Cardiac catheterization    . Mastectomy complete / simple w/ sentinel node biopsy Right 08/15/2015  . Laparoscopic cholecystectomy    . Fracture surgery    . Dilation and curettage of uterus      "before hysterectomy"  . Abdominal hysterectomy      "left my ovaries"  . Portacath placement Left 08/15/2015  . Breast biopsy  07/2015  . Mastectomy w/ sentinel node biopsy Right 08/15/2015    Procedure: TOTAL MASTECTOMY WITH SENTINEL LYMPH NODE BIOPSY AND BLUE DYE INJECTION ;  Surgeon: Fanny Skates, MD;  Location: [place];  Service: General;  Laterality: Right;  . Portacath placement Left 08/15/2015    Procedure: INSERTION PORT-A-CATH ;  Surgeon: Fanny Skates, MD;  Location: [place];  Service: General;  Laterality: Left;    There were no vitals filed for this visit.      Subjective Assessment - 09/30/15 0849    Subjective Went to ED and was diagnosed with dehydration.  Is drinking Powerade now, and that's going okay.  Is doing better, is eating some now, and is taking the anti-nausea pill regularly.  Dr.  Dalbert Batman withdrew 150 cc of fluid from right chest area a week ago; will see him again soon. Hasn't done anything because she doesn't feel like it.   Currently in Pain? No/denies            [facility] PT Assessment - 09/30/15 0001    AROM   Right Shoulder Flexion 139 Degrees   Right Shoulder ABduction 169 Degrees                     [facility] Adult PT Treatment/Exercise - 09/30/15 0001    Manual Therapy   Manual Therapy Soft tissue mobilization   Soft tissue mobilization At right chest around incision   Myofascial Release Crosshands technique in vertical, diagonal, and horizontal at right chest; right UE myofascial pulling with movement into abduction, then with concurrent stretch across right axilla    Passive ROM Pect minor stretches in supine; right shoulder into ER, abduction, and flexion with stretch to patient tolerance   Prosthetics   Prosthetic Care Comments                      Short Term Clinic Goals - 09/30/15 1236    CC Short Term Goal  #1   Title Independent in a home exercise program for right shoulder ROM and in safe self-progression.   Status Partially Met           Breast Clinic Goals - 07/13/15 1339    Patient will be able to verbalize understanding of pertinent lymphedema risk reduction practices relevant to her diagnosis specifically related to skin care.   Time 1   Period Days   Status Achieved   Patient will be able to return demonstrate and/or verbalize understanding of the post-op home exercise program related to regaining shoulder range of motion.   Time 1   Period Days   Status Achieved   Patient will be able to verbalize understanding of the importance of attending the postoperative After Breast Cancer Class for further lymphedema risk reduction education and therapeutic exercise.   Time 1   Period Days   Status Achieved          Long Term Clinic Goals - 09/21/15 1205    CC Long Term Goal  #1   Title Right shoulder active flexion to at least 125 degrees for improved overhead reach.   Status Achieved   CC Long Term Goal  #2   Title right shoulder active abduction to at least 140 degrees   Status Achieved   CC Long Term Goal  #3   Title Right shoulder pain on movement decreased at least 50%.   Status On-going            Plan - 09/30/15 1231    Clinical Impression Statement Right shoulder active abduction has again improved a good amount, and flexion has improved a little bit.  Patient has had a tough week with her reaction to chemotherapy and ending up at the ED with dehydration following diarrhea.   Rehab Potential Excellent   Clinical Impairments Affecting Rehab Potential Surgery still recent (08/15/15); will start chemo  09/22/15.   PT Frequency 2x / week   PT Duration 6 weeks   PT Treatment/Interventions ADLs/Self Care Home Management;Therapeutic exercise;Patient/family education;Manual techniques;Manual lymph drainage;Scar mobilization;Passive range of motion   PT Next Visit Plan  Cont P/AA/A/ROM.  Continue soft tissue mobilization.  Continue manual lymph drainage to right flank area of swelling at lateral incision if beneficial.  Progress HEP.   Consulted and Agree with Plan of Care Patient      Patient will benefit from skilled therapeutic intervention in order to improve the following deficits and impairments:  Decreased knowledge of precautions, Decreased range of motion, Increased edema, Decreased scar mobility, Impaired UE functional use  Visit Diagnosis: Stiffness of right shoulder, not elsewhere classified  Aftercare following surgery for neoplasm  Pain in right shoulder     Problem List Patient Active Problem List   Diagnosis Date Noted  . Genetic testing 08/21/2015  . Cancer of right breast ([place]) 08/15/2015  . Family history of breast cancer in female 07/15/2015  . Family history of colon cancer 07/15/2015  . Breast cancer of lower-outer quadrant of right female breast ([place]) 07/08/2015  . Disturbance in sleep behavior 11/18/2014  . Fibromyalgia 10/04/2014  . Flu-like symptoms 07/09/2014  . Diverticulitis of colon (without mention of hemorrhage) 12/01/2013  . UTI (urinary tract infection) 12/01/2013  . Abdominal pain, unspecified site 12/01/2013  . Dry mouth 04/29/2013  . Dry eye 04/29/2013  . Dry skin 04/29/2013  . Obesity (BMI 30-39.9) 03/11/2013  . Sweating profusely 01/16/2013  . Grief 10/17/2012  . Edema 09/04/2012  . Depression 09/04/2012  . Acute bronchitis 05/30/2012  . Peripheral neuropathy ([place]) 05/12/2012  . OAB (overactive bladder) 04/01/2012  . Hypokalemia 02/06/2012  . DM w/o complication type II ([place]) 02/06/2012  . Colitis 01/21/2012  . Physical exam, annual  10/22/2011  . Allergy to dogs 04/19/2011  . Polyarthralgia 09/18/2010  . Diverticulitis 09/10/2010  . Obesity 07/25/2010  . DIVERTICULOSIS, COLON 06/02/2010  . STRESS INCONTINENCE 04/25/2010  . ASTHMA 04/11/2010  . VERTIGO 04/11/2010  . ACUTE SINUSITIS, UNSPECIFIED 04/04/2010  . Hypothyroidism 01/19/2010  . Hyperlipidemia associated with type 2 diabetes mellitus ([place]) 01/19/2010  . DEPRESSION 01/19/2010  . Essential hypertension 01/19/2010  . GERD 01/19/2010  . PEPTIC ULCER DISEASE 01/19/2010  . OSTEOARTHRITIS 01/19/2010  . LOW BACK PAIN 01/19/2010    SALISBURY,DONNA 09/30/2015, 12:37 PM  [place], [place], [address] Phone: [phone]   Fax:  [phone]  Name: MARYLENE MASEK MRN: [id number] Date of Birth: 05-25-1945    Serafina Royals, PT 09/30/2015 12:37 PM

## 2015-09-30 NOTE — Progress Notes (Signed)
Patient Care Team: Midge Minium, MD as PCP - General [name], MD as Consulting Physician (Cardiology) Marylynn Pearson, MD as Consulting Physician (Ophthalmology) Nicholas Lose, MD as Consulting Physician (Hematology and Oncology) Kyung Rudd, MD as Consulting Physician (Radiation Oncology) Fanny Skates, MD as Consulting Physician (General Surgery)  DIAGNOSIS: Breast cancer of lower-outer quadrant of right female breast [facility])   Staging form: Breast, AJCC 7th Edition     Clinical stage from 07/13/2015: Stage IA (T1c, N0, M0) - Unsigned   SUMMARY OF ONCOLOGIC HISTORY:   Breast cancer of lower-outer quadrant of right female breast ([place])   07/06/2015 Initial Diagnosis Right breast biopsy posterior: IDC ER 90%, PR 5%, Ki-67 60%, HER-2 positive ratio 1.42,copy #6.1 T1c N0 stage IA; Right breast biopsy inferior medial: High-grade DCIS with comedonecrosis; ER 100%, PR 90%; 5 mm calcs   07/27/2015 Procedure 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 Today cycle 1 day 8. After cycle 1 of treatment she started having diarrhea. She did not call us and inform was about her diarrhea. She was apparently in her bathroom and was trying to get up and felt lightheaded and fell down. She was brought into the emergency room for the syncope. She was given 4 L of saline and was found to be hypotensive.  Her blood pressure had improved and she was feeling better and so they discharged her home. Her diarrhea has improved and she occasionally has loose stools but no diarrhea. She was also prescribed potassium for hypokalemia. She does feel abdominal cramps and nausea.  REVIEW OF SYSTEMS:   Constitutional: Denies fevers, chills or abnormal weight loss Eyes: Denies blurriness of vision Ears, nose, mouth, throat, and face: Denies mucositis or sore throat Respiratory: Denies cough, dyspnea or wheezes Cardiovascular: Denies palpitation, chest discomfort Gastrointestinal:  Nausea and abdominal cramps Skin: Denies abnormal skin rashes Lymphatics: Denies new lymphadenopathy or easy bruising Neurological: Syncope recently, fatigue Behavioral/Psych: Mood is stable, no new changes  Extremities: No lower extremity edema All other systems were reviewed with the patient and are negative.  I have reviewed the past medical history, past surgical history, social history and family history with the patient and they are unchanged from previous note.  ALLERGIES:  is allergic to contrast media; dilaudid; adhesive; codeine; and lactose intolerance (gi).  MEDICATIONS:  Current Outpatient Prescriptions  Medication Sig Dispense Refill  . albuterol (PROAIR HFA) 108 (90 BASE) MCG/ACT inhaler Inhale 2 puffs into the lungs every 6 (six) hours as needed for wheezing. 1 Inhaler 3  . ALPRAZolam (XANAX) 1 MG tablet TAKE 1 TABLET BY MOUTH TWICE DAILY AS NEEDED FOR ANXIETY 60 tablet 3  . amLODipine-valsartan (EXFORGE) 5-160 MG tablet Take 0.5 tablets by mouth daily. 90 tablet 1  . anastrozole (ARIMIDEX) 1 MG tablet Take 1 tablet (1 mg total) by mouth daily. 30 tablet 0  . aspirin 81 MG tablet Take 81 mg by mouth daily.    Marland Kitchen  atorvastatin (LIPITOR) 20 MG tablet TAKE 1 TABLET BY MOUTH EVERY DAY 30 tablet 6  . buPROPion (WELLBUTRIN XL) 300 MG 24 hr tablet TAKE 1 TABLET BY MOUTH EVERY DAY 30 tablet 3  . cyclobenzaprine  (FLEXERIL) 5 MG tablet Take 5 mg by mouth daily as needed for muscle spasms.     Marland Kitchen dexamethasone (DECADRON) 4 MG tablet Take 1 tablet (4 mg total) by mouth 2 (two) times daily. Start the day before Taxotere. Then again the day after chemo for 1 day. 30 tablet 0  . levothyroxine (SYNTHROID, LEVOTHROID) 75 MCG tablet Take 1 tablet (75 mcg total) by mouth daily. 30 tablet 6  . lidocaine-prilocaine (EMLA) cream Apply to affected area once 30 g 3  . LORazepam (ATIVAN) 0.5 MG tablet Take 1 tablet (0.5 mg total) by mouth at bedtime. (Patient taking differently: Take 0.5 mg by mouth at bedtime as needed for sleep. ) 30 tablet 0  . magic mouthwash w/lidocaine SOLN 89m Swish,Swallow, or spit 4 times a day as needed 240 mL 1  . meloxicam (MOBIC) 15 MG tablet Take 15 mg by mouth daily.     . naproxen sodium (ANAPROX) 220 MG tablet Take 220 mg by mouth 3 (three) times daily as needed (for pain).    . nitroGLYCERIN (NITROSTAT) 0.4 MG SL tablet Place 1 tablet (0.4 mg total) under the tongue every 5 (five) minutes as needed. As needed for chest pain 30 tablet 3  . ondansetron (ZOFRAN) 8 MG tablet Take 1 tablet (8 mg total) by mouth 2 (two) times daily as needed for refractory nausea / vomiting. Start on day 3 after chemo. (Patient taking differently: Take 8 mg by mouth 2 (two) times daily as needed for refractory nausea / vomiting. Start on day 3 after chemo. Also takes 1 tablet in the morning) 30 tablet 1  . Polyethyl Glycol-Propyl Glycol (SYSTANE ULTRA) 0.4-0.3 % SOLN Place 1 drop into both eyes 3 (three) times daily as needed (for dry eyes).     . prochlorperazine (COMPAZINE) 10 MG tablet Take 1 tablet (10 mg total) by mouth every 6 (six) hours as needed (Nausea or vomiting). (Patient taking differently: Take 10 mg by mouth at bedtime as needed for nausea or vomiting. ) 30 tablet 1  . ranitidine (ZANTAC) 150 MG tablet Take 150 mg by mouth 2 (two) times daily as needed for heartburn.     .Marland KitchentiZANidine (ZANAFLEX) 4 MG  tablet Take 4 mg by mouth every 6 (six) hours as needed for muscle spasms.    . traMADol (ULTRAM) 50 MG tablet Take 50 mg by mouth daily as needed for moderate pain.   0   No current facility-administered medications for this visit.    PHYSICAL EXAMINATION: ECOG PERFORMANCE STATUS: 1 - Symptomatic but completely ambulatory  Filed Vitals:   09/30/15 1026  BP: 112/72  Pulse: 85  Temp: 98.5 F (36.9 C)  Resp: 18   Filed Weights   09/30/15 1026  Weight: 193 lb 14.4 oz (87.952 kg)    GENERAL:alert, no distress and comfortable SKIN: skin color, texture, turgor are normal, no rashes or significant lesions EYES: normal, Conjunctiva are pink and non-injected, sclera clear OROPHARYNX:no exudate, no erythema and lips, buccal mucosa, and tongue normal  NECK: supple, thyroid normal size, non-tender, without nodularity LYMPH:  no palpable lymphadenopathy in the cervical, axillary or inguinal LUNGS: clear to auscultation and percussion with normal breathing effort HEART: regular rate & rhythm and no murmurs and no lower extremity  edema ABDOMEN:abdomen soft, non-tender and normal bowel sounds MUSCULOSKELETAL:no cyanosis of digits and no clubbing  NEURO: alert & oriented x 3 with fluent speech, no focal motor/sensory deficits EXTREMITIES: No lower extremity edema  LABORATORY DATA:  I have reviewed the data as listed   Chemistry      Component Value Date/Time   NA 140 09/30/2015 0959   NA 138 09/27/2015 0657   K 4.3 09/30/2015 0959   K 2.7* 09/27/2015 0657   CL 107 09/27/2015 0657   CO2 28 09/30/2015 0959   CO2 25 09/27/2015 0657   BUN 9.2 09/30/2015 0959   BUN 15 09/27/2015 0657   CREATININE 1.4* 09/30/2015 0959   CREATININE 1.03* 09/27/2015 0657   CREATININE 0.79 05/21/2014 1610      Component Value Date/Time   CALCIUM 9.1 09/30/2015 0959   CALCIUM 7.2* 09/27/2015 0657   ALKPHOS 93 09/30/2015 0959   ALKPHOS 61 09/27/2015 0657   AST 19 09/30/2015 0959   AST 15 09/27/2015  0657   ALT 18 09/30/2015 0959   ALT 15 09/27/2015 0657   BILITOT 0.43 09/30/2015 0959   BILITOT 1.1 09/27/2015 0657       Lab Results  Component Value Date   WBC 28.9* 09/30/2015   HGB 11.5* 09/30/2015   HCT 36.0 09/30/2015   MCV 91.4 09/30/2015   PLT 239 09/30/2015   NEUTROABS 23.1* 09/30/2015     ASSESSMENT & PLAN:  Breast cancer of lower-outer quadrant of right female breast ([place]) Breast cancer of lower-outer quadrant of right female breast ([place]) Right mastectomy 08/15/2015: IDC grade 2, 2.2 cm, with associated DCIS intermediate grade, separate focus high-grade DCIS, 0/4 lymph nodes negative, T2 N0 stage II a, ER 90%, PR 5%, HER-2 negative duration 1.42, Ki-67 60%  Treatment plan: 1. Adjuvant chemotherapy with [facility] 2. Followed by adjuvant antiestrogen therapy with anastrozole 5 years No role of radiation since she had mastectomy. ----------------------------------------------------------------------------------------------------------------------- Current treatment: Cycle 1 day 8 TCH Blood counts were reviewed Echocardiogram was reviewed 08/25/2015: EF 65-70%  Chemotherapy toxicities: 1. Syncope: Patient went to the emergency room and was discharged home 2. Diarrhea due to chemotherapy: Encouraged her to take Imodium and given written instructions on Imodium. 3. Nausea due to chemotherapy: I will add Emend to her second cycle.  I instructed the patient to call us if she does any further diarrhea. We may then be able to prescribe her Lomotil. Return to clinic in 2 week for cycle 2 chemotherapy.   No orders of the defined types were placed in this encounter.   The patient has a good understanding of the overall plan. she agrees with it. she will call with any problems that may develop before the next visit here.   Rulon Eisenmenger, MD 09/30/2015

## 2015-10-03 ENCOUNTER — Ambulatory Visit: Payer: Medicare Other | Admitting: Physical Therapy

## 2015-10-03 DIAGNOSIS — R222 Localized swelling, mass and lump, trunk: Secondary | ICD-10-CM | POA: Diagnosis not present

## 2015-10-03 DIAGNOSIS — M25511 Pain in right shoulder: Secondary | ICD-10-CM | POA: Diagnosis not present

## 2015-10-03 DIAGNOSIS — Z483 Aftercare following surgery for neoplasm: Secondary | ICD-10-CM | POA: Diagnosis not present

## 2015-10-03 DIAGNOSIS — M25611 Stiffness of right shoulder, not elsewhere classified: Secondary | ICD-10-CM | POA: Diagnosis not present

## 2015-10-03 DIAGNOSIS — R293 Abnormal posture: Secondary | ICD-10-CM | POA: Diagnosis not present

## 2015-10-03 NOTE — Therapy (Signed)
[place], [place], [address] Phone: [phone]   Fax:  [phone]  Physical Therapy Treatment  Patient Details  Name: Tracey Evans MRN: [id number] Date of Birth: [birth year] Referring Provider: Dr. Brion Aliment  Encounter Date: 10/03/2015      PT End of Session - 10/03/15 0938    Visit Number 7   Number of Visits 13   Date for PT Re-Evaluation 10/14/15   PT Start Time 0843   PT Stop Time 0930   PT Time Calculation (min) 47 min   Activity Tolerance Patient tolerated treatment well   Behavior During Therapy [facility] for tasks assessed/performed      Past Medical History  Diagnosis Date  . GERD (gastroesophageal reflux disease)   . Hyperlipidemia   . Hypertension   . Depression   . Fibromyalgia   . Peptic ulcer disease   . Hypothyroidism   . Chronic lower back pain   . Asthma   . Diverticular disease   . Binge eating disorder   . Costochondritis   . Anxiety   . Hot flashes   . Shortness of breath dyspnea   . Complication of anesthesia     had bronc spasms during intubation surgery 2009 on foot-need albuterol inhaler or neb tx preop  . Angina at rest [facility])     "occurs whenever it wants to, but worse during agitation"  . Chronic bronchitis ([place])   . Type II diabetes mellitus ([place])     "diet controlled" (08/15/2015)  . Osteoarthritis     "all over"  . Breast cancer of lower-outer quadrant of left female breast ([place]) 07/08/2015    "NEVER HAD LEFT BREAST CANCER" (08/15/2015)  . Cancer of right breast ([place]) 08/15/2015    Past Surgical History  Procedure Laterality Date  . Tonsillectomy    . Shoulder arthroscopy Bilateral 2009-2011    "bone spurs"  . Colonoscopy    . Ankle fracture surgery Right 2009    Dr. Rolena Infante  . Ankle arthroscopy  01/02/2012    Procedure: ANKLE ARTHROSCOPY;  Surgeon: Colin Rhein, MD;  Location: [place];  Service: Orthopedics;  Laterality:  Right;  right ankle arthroscopy with extensive debridement , dridement and drilling talar dome osteochondral lesion  . Knee arthroscopy Bilateral   . Carpal tunnel release Bilateral   . Cardiac catheterization    . Mastectomy complete / simple w/ sentinel node biopsy Right 08/15/2015  . Laparoscopic cholecystectomy    . Fracture surgery    . Dilation and curettage of uterus      "before hysterectomy"  . Abdominal hysterectomy      "left my ovaries"  . Portacath placement Left 08/15/2015  . Breast biopsy  07/2015  . Mastectomy w/ sentinel node biopsy Right 08/15/2015    Procedure: TOTAL MASTECTOMY WITH SENTINEL LYMPH NODE BIOPSY AND BLUE DYE INJECTION ;  Surgeon: Fanny Skates, MD;  Location: [place];  Service: General;  Laterality: Right;  . Portacath placement Left 08/15/2015    Procedure: INSERTION PORT-A-CATH ;  Surgeon: Fanny Skates, MD;  Location: [place];  Service: General;  Laterality: Left;    There were no vitals filed for this visit.      Subjective Assessment - 10/03/15 0845    Subjective Had a visit with Dr. Lindi Adie after I left here on Friday.  Most of it was good; I am one who has to take a nausea and diarrhea pill every day.  I've  been able to eat more since I've been doing that.  Felt fine after last visit; I think the stretching was wonderful--I noticed I was doing things without thinking.  I felt the pull but it wasn't a painful pull.   Currently in Pain? No/denies            [facility] PT Assessment - 10/03/15 0001    Observation/Other Assessments   Observations mild cording noted at right axilla with shoulder in abduction                     [facility] Adult PT Treatment/Exercise - 10/03/15 0001    Shoulder Exercises: Sidelying   Other Sidelying Exercises in left sidelying, right shoulder horizontal abduction/scaption   Manual Therapy   Manual Therapy Scapular mobilization   Soft tissue mobilization At right chest around incision; at right axilla where mild  cording was noted   Myofascial Release Crosshands technique in vertical, diagonal, and horizontal at right chest; right UE myofascial pulling with movement into abduction, then with concurrent stretch across right axilla   Scapular Mobilization In left sidelying, right scapular protraction and depression with gentle rocking motions.   Passive ROM Pect minor stretches in supine; right shoulder into ER, abduction, and flexion with stretch to patient tolerance   Prosthetics   Current prosthetic wear tolerance (#hours/day)                      Short Term Clinic Goals - 09/30/15 1236    CC Short Term Goal  #1   Title Independent in a home exercise program for right shoulder ROM and in safe self-progression.   Status Partially Met           Breast Clinic Goals - 07/13/15 1339    Patient will be able to verbalize understanding of pertinent lymphedema risk reduction practices relevant to her diagnosis specifically related to skin care.   Time 1   Period Days   Status Achieved   Patient will be able to return demonstrate and/or verbalize understanding of the post-op home exercise program related to regaining shoulder range of motion.   Time 1   Period Days   Status Achieved   Patient will be able to verbalize understanding of the importance of attending the postoperative After Breast Cancer Class for further lymphedema risk reduction education and therapeutic exercise.   Time 1   Period Days   Status Achieved          Long Term Clinic Goals - 10/03/15 0931    CC Long Term Goal  #2   Title right shoulder active abduction to at least 140 degrees   Status Partially Met   CC Long Term Goal  #3   Title Right shoulder pain on movement decreased at least 50%.   Status Achieved   CC Long Term Goal  #4   Title right shoulder active abduction to at least 150 degrees   Status New   CC Long Term Goal  #5   Title right chest and axilla area perceived tightness reduced at least  60%   Status New   CC Long Term Goal  #6   Title will no longer have visible and palpable cording on abduction of right shoulder   Status New   Additional Goals   Additional Goals Yes            Plan - 10/03/15 0939    Clinical Impression Statement Patient notes benefit of  therapy and feels looser at end of session.  She has met original goals, although active right shoulder abduction goal was again not met when last measured.  Discussed with patient today that she should schedule more appointments, and she agreed; new goals were written after discussion with her.  Patient noted difficulty controlling position of right shoulder when performing horizontal abduction while in left sidelying.   Rehab Potential Excellent   Clinical Impairments Affecting Rehab Potential Surgery still recent (08/15/15); will start chemo 09/22/15.   PT Frequency 2x / week   PT Duration 6 weeks   PT Treatment/Interventions ADLs/Self Care Home Management;Therapeutic exercise;Patient/family education;Manual techniques;Manual lymph drainage;Scar mobilization;Passive range of motion   PT Next Visit Plan  Remeasure right shoulder AROM.  Add strengthening and stabilization of right shoulder.  Cont P/AA/A/ROM.  Continue soft tissue mobilization.  Continue manual lymph drainage to right flank area of swelling at lateral incision if beneficial.  Progress HEP.   Consulted and Agree with Plan of Care Patient      Patient will benefit from skilled therapeutic intervention in order to improve the following deficits and impairments:  Decreased knowledge of precautions, Decreased range of motion, Increased edema, Decreased scar mobility, Impaired UE functional use  Visit Diagnosis: Stiffness of right shoulder, not elsewhere classified  Aftercare following surgery for neoplasm  Pain in right shoulder     Problem List Patient Active Problem List   Diagnosis Date Noted  . Genetic testing 08/21/2015  . Cancer of right  breast ([place]) 08/15/2015  . Family history of breast cancer in female 07/15/2015  . Family history of colon cancer 07/15/2015  . Breast cancer of lower-outer quadrant of right female breast ([place]) 07/08/2015  . Disturbance in sleep behavior 11/18/2014  . Fibromyalgia 10/04/2014  . Flu-like symptoms 07/09/2014  . Diverticulitis of colon (without mention of hemorrhage) 12/01/2013  . UTI (urinary tract infection) 12/01/2013  . Abdominal pain, unspecified site 12/01/2013  . Dry mouth 04/29/2013  . Dry eye 04/29/2013  . Dry skin 04/29/2013  . Obesity (BMI 30-39.9) 03/11/2013  . Sweating profusely 01/16/2013  . Grief 10/17/2012  . Edema 09/04/2012  . Depression 09/04/2012  . Acute bronchitis 05/30/2012  . Peripheral neuropathy ([place]) 05/12/2012  . OAB (overactive bladder) 04/01/2012  . Hypokalemia 02/06/2012  . DM w/o complication type II ([employer]) 02/06/2012  . Colitis 01/21/2012  . Physical exam, annual 10/22/2011  . Allergy to dogs 04/19/2011  . Polyarthralgia 09/18/2010  . Diverticulitis 09/10/2010  . Obesity 07/25/2010  . DIVERTICULOSIS, COLON 06/02/2010  . STRESS INCONTINENCE 04/25/2010  . ASTHMA 04/11/2010  . VERTIGO 04/11/2010  . ACUTE SINUSITIS, UNSPECIFIED 04/04/2010  . Hypothyroidism 01/19/2010  . Hyperlipidemia associated with type 2 diabetes mellitus ([place]) 01/19/2010  . DEPRESSION 01/19/2010  . Essential hypertension 01/19/2010  . GERD 01/19/2010  . PEPTIC ULCER DISEASE 01/19/2010  . OSTEOARTHRITIS 01/19/2010  . LOW BACK PAIN 01/19/2010    Jobany Montellano 10/03/2015, 9:42 AM  [place], [place], [address] Phone: [phone]   Fax:  [phone]  Name: Tracey Evans MRN: [id number] Date of Birth: 1946-04-07    Serafina Royals, PT 10/03/2015 9:42 AM

## 2015-10-04 ENCOUNTER — Ambulatory Visit: Payer: Medicare Other

## 2015-10-04 ENCOUNTER — Other Ambulatory Visit: Payer: Medicare Other

## 2015-10-05 ENCOUNTER — Ambulatory Visit (INDEPENDENT_AMBULATORY_CARE_PROVIDER_SITE_OTHER): Payer: Medicare Other | Admitting: Family Medicine

## 2015-10-05 ENCOUNTER — Encounter: Payer: Self-pay | Admitting: Family Medicine

## 2015-10-05 VITALS — BP 102/68 | HR 96 | Temp 97.8°F | Resp 16 | Ht 66.0 in | Wt 196.4 lb

## 2015-10-05 DIAGNOSIS — R55 Syncope and collapse: Secondary | ICD-10-CM | POA: Diagnosis not present

## 2015-10-05 DIAGNOSIS — I10 Essential (primary) hypertension: Secondary | ICD-10-CM | POA: Diagnosis not present

## 2015-10-05 NOTE — Progress Notes (Signed)
Pre visit review using our clinic review tool, if applicable. No additional management support is needed unless otherwise documented below in the visit note. 

## 2015-10-05 NOTE — Assessment & Plan Note (Signed)
New.  Pt had vagal event in setting of N/V/D due to chemo.  Pt's K+ rebounded once volume corrected.  Currently asymptomatic.  Will stop BP meds in setting of soft pressure.  Reviewed supportive care and red flags that should prompt return.  Pt expressed understanding and is in agreement w/ plan.

## 2015-10-05 NOTE — Progress Notes (Signed)
   Subjective:    Patient ID: Tracey Evans, female    DOB: April 17, 1945, 69 y.o.   MRN: [id number]  HPI ER f/u- pt had syncopal episode on 6/20 after starting chemo 2 weeks earlier.  She had started having N/V/D 4 days prior to syncopal episode.  Pt continues to have area TTP on L posterior scalp- pt had area checked by oncology.  Was found to have low K+ and elevated WBC (28.9)- pt had injxn of Neulasta.  Pt's BP continues to be low- 102/68.  Pt is only taking 1/2 tab of Amlodipine/Valsartan combo.  Denies CP, SOB, HAs.  + blurry vision.  Review of Systems For ROS see HPI     Objective:   Physical Exam  Constitutional: She is oriented to person, place, and time. She appears well-developed and well-nourished. No distress.  HENT:  Head: Normocephalic and atraumatic.  Eyes: Conjunctivae and EOM are normal. Pupils are equal, round, and reactive to light.  Neck: Normal range of motion. Neck supple. No thyromegaly present.  Cardiovascular: Normal rate, regular rhythm, normal heart sounds and intact distal pulses.   No murmur heard. Pulmonary/Chest: Effort normal and breath sounds normal. No respiratory distress.  Abdominal: Soft. She exhibits no distension. There is no tenderness.  Musculoskeletal: She exhibits no edema.  Lymphadenopathy:    She has no cervical adenopathy.  Neurological: She is alert and oriented to person, place, and time.  Skin: Skin is warm and dry.  Psychiatric: She has a normal mood and affect. Her behavior is normal.  Vitals reviewed.         Assessment & Plan:

## 2015-10-05 NOTE — Patient Instructions (Signed)
Follow up as scheduled STOP the Amlodipine/Valsartan Continue to drink plenty of fluids Keep up the good work on your walking- you look great! Call with any questions or concerns Happy 4th of July!!!

## 2015-10-05 NOTE — Assessment & Plan Note (Signed)
Now hypotensive.  Pt to stop the Amlodipine/Valsartan combo.  She will continue to monitor her BP at home and if it's consistently >140/90, she will call me.  Pt expressed understanding and is in agreement w/ plan.

## 2015-10-07 ENCOUNTER — Ambulatory Visit: Payer: Medicare Other | Admitting: Physical Therapy

## 2015-10-07 DIAGNOSIS — R222 Localized swelling, mass and lump, trunk: Secondary | ICD-10-CM

## 2015-10-07 DIAGNOSIS — Z483 Aftercare following surgery for neoplasm: Secondary | ICD-10-CM

## 2015-10-07 DIAGNOSIS — M25611 Stiffness of right shoulder, not elsewhere classified: Secondary | ICD-10-CM

## 2015-10-07 DIAGNOSIS — M25511 Pain in right shoulder: Secondary | ICD-10-CM

## 2015-10-07 DIAGNOSIS — R293 Abnormal posture: Secondary | ICD-10-CM | POA: Diagnosis not present

## 2015-10-07 NOTE — Therapy (Signed)
[place], [place], [address] Phone: [phone]   Fax:  [phone]  Physical Therapy Treatment  Patient Details  Name: Tracey Evans MRN: [id number] Date of Birth: 09/29/1945 Referring Provider: Dr. Brion Aliment  Encounter Date: 10/07/2015      PT End of Session - 10/07/15 1238    Visit Number 8   Number of Visits 13   Date for PT Re-Evaluation 10/14/15   PT Start Time 0849   PT Stop Time 0934   PT Time Calculation (min) 45 min   Activity Tolerance Patient tolerated treatment well   Behavior During Therapy [facility] for tasks assessed/performed      Past Medical History  Diagnosis Date  . GERD (gastroesophageal reflux disease)   . Hyperlipidemia   . Hypertension   . Depression   . Fibromyalgia   . Peptic ulcer disease   . Hypothyroidism   . Chronic lower back pain   . Asthma   . Diverticular disease   . Binge eating disorder   . Costochondritis   . Anxiety   . Hot flashes   . Shortness of breath dyspnea   . Complication of anesthesia     had bronc spasms during intubation surgery 2009 on foot-need albuterol inhaler or neb tx preop  . Angina at rest [facility])     "occurs whenever it wants to, but worse during agitation"  . Chronic bronchitis ([place])   . Type II diabetes mellitus ([place])     "diet controlled" (08/15/2015)  . Osteoarthritis     "all over"  . Breast cancer of lower-outer quadrant of left female breast ([place]) 07/08/2015    "NEVER HAD LEFT BREAST CANCER" (08/15/2015)  . Cancer of right breast ([place]) 08/15/2015    Past Surgical History  Procedure Laterality Date  . Tonsillectomy    . Shoulder arthroscopy Bilateral 2009-2011    "bone spurs"  . Colonoscopy    . Ankle fracture surgery Right 2009    Dr. Rolena Infante  . Ankle arthroscopy  01/02/2012    Procedure: ANKLE ARTHROSCOPY;  Surgeon: Colin Rhein, MD;  Location: [place];  Service: Orthopedics;  Laterality:  Right;  right ankle arthroscopy with extensive debridement , dridement and drilling talar dome osteochondral lesion  . Knee arthroscopy Bilateral   . Carpal tunnel release Bilateral   . Cardiac catheterization    . Mastectomy complete / simple w/ sentinel node biopsy Right 08/15/2015  . Laparoscopic cholecystectomy    . Fracture surgery    . Dilation and curettage of uterus      "before hysterectomy"  . Abdominal hysterectomy      "left my ovaries"  . Portacath placement Left 08/15/2015  . Breast biopsy  07/2015  . Mastectomy w/ sentinel node biopsy Right 08/15/2015    Procedure: TOTAL MASTECTOMY WITH SENTINEL LYMPH NODE BIOPSY AND BLUE DYE INJECTION ;  Surgeon: Fanny Skates, MD;  Location: [place];  Service: General;  Laterality: Right;  . Portacath placement Left 08/15/2015    Procedure: INSERTION PORT-A-CATH ;  Surgeon: Fanny Skates, MD;  Location: [place];  Service: General;  Laterality: Left;    There were no vitals filed for this visit.      Subjective Assessment - 10/07/15 0852    Subjective They have taken me off amlodipine for now.  I saw Dr. Dalbert Batman yesterday--he drew off 80 ccs of fluid again (last time it was 150).  I go back in three weeks.  Currently in Pain? Yes   Pain Score 6    Pain Location Back   Pain Orientation Upper;Mid;Lower   Pain Descriptors / Indicators Aching   Aggravating Factors  moving   Pain Relieving Factors lying down            [facility] PT Assessment - 10/07/15 0001    AROM   Right Shoulder Flexion 142 Degrees   Right Shoulder ABduction 169 Degrees  toward scaption                     [facility] Adult PT Treatment/Exercise - 10/07/15 0001    Shoulder Exercises: Supine   Other Supine Exercises Verbal review of stretches over towel roll (for HEP) for shoulder horizontal abduction, "V" position, and flexion bilat. to be held 60 seconds.   Shoulder Exercises: Standing   Other Standing Exercises Reviewed dowel stretches for abduction and  flexion of right shoulder.   Manual Therapy   Manual Therapy --   Myofascial Release Right UE myofascial pulling   Scapular Mobilization In left sidelying, right scapular protraction and depression with gentle rocking motions.   Manual Lymphatic Drainage (MLD) In left sidelying, posterior interaxillary anastomosis right to left and right axillo-inguinal anastomosis   Passive ROM Pect minor stretches in supine; right shoulder into ER, abduction, and flexion with stretch to patient tolerance                PT Education - 10/07/15 1237    Education provided Yes   Education Details reviewed HEP   Person(s) Educated Patient   Methods Explanation   Comprehension Verbalized understanding           Short Term Clinic Goals - 09/30/15 1236    CC Short Term Goal  #1   Title Independent in a home exercise program for right shoulder ROM and in safe self-progression.   Status Partially Met           Breast Clinic Goals - 07/13/15 1339    Patient will be able to verbalize understanding of pertinent lymphedema risk reduction practices relevant to her diagnosis specifically related to skin care.   Time 1   Period Days   Status Achieved   Patient will be able to return demonstrate and/or verbalize understanding of the post-op home exercise program related to regaining shoulder range of motion.   Time 1   Period Days   Status Achieved   Patient will be able to verbalize understanding of the importance of attending the postoperative After Breast Cancer Class for further lymphedema risk reduction education and therapeutic exercise.   Time 1   Period Days   Status Achieved          Long Term Clinic Goals - 10/03/15 0931    CC Long Term Goal  #2   Title right shoulder active abduction to at least 140 degrees   Status Partially Met   CC Long Term Goal  #3   Title Right shoulder pain on movement decreased at least 50%.   Status Achieved   CC Long Term Goal  #4   Title right  shoulder active abduction to at least 150 degrees   Status New   CC Long Term Goal  #5   Title right chest and axilla area perceived tightness reduced at least 60%   Status New   CC Long Term Goal  #6   Title will no longer have visible and palpable cording on abduction of right shoulder  Status New   Additional Goals   Additional Goals Yes            Plan - 10/07/15 1238    Clinical Impression Statement Patient with just a slight gain in active flexion with measurement today.  She had more fluid drawn off the right chest this week, and still has fullness at the right abdomen.  She wanted to avoid any pulling on the spot where the needle was put in (on her incision) to draw off the fluid.  She said the surgeon wants her to continue therapy.   Rehab Potential Excellent   Clinical Impairments Affecting Rehab Potential Surgery still recent (08/15/15); will start chemo 09/22/15.   PT Frequency 2x / week   PT Duration 6 weeks   PT Treatment/Interventions ADLs/Self Care Home Management;Therapeutic exercise;Patient/family education;Manual techniques;Manual lymph drainage;Scar mobilization;Passive range of motion   PT Next Visit Plan  Add strengthening and stabilization of right shoulder.  Cont P/AA/A/ROM.  Continue soft tissue mobilization.  Continue manual lymph drainage to right flank area of swelling at lateral incision if beneficial.  Progress HEP.   PT Home Exercise Plan right shoulder stretches   Consulted and Agree with Plan of Care Patient      Patient will benefit from skilled therapeutic intervention in order to improve the following deficits and impairments:  Decreased knowledge of precautions, Decreased range of motion, Increased edema, Decreased scar mobility, Impaired UE functional use  Visit Diagnosis: Stiffness of right shoulder, not elsewhere classified  Aftercare following surgery for neoplasm  Pain in right shoulder  Localized swelling, mass and lump,  trunk     Problem List Patient Active Problem List   Diagnosis Date Noted  . Syncope 10/05/2015  . Genetic testing 08/21/2015  . Cancer of right breast ([place]) 08/15/2015  . Family history of breast cancer in female 07/15/2015  . Family history of colon cancer 07/15/2015  . Breast cancer of lower-outer quadrant of right female breast ([place]) 07/08/2015  . Disturbance in sleep behavior 11/18/2014  . Fibromyalgia 10/04/2014  . Flu-like symptoms 07/09/2014  . Diverticulitis of colon (without mention of hemorrhage) 12/01/2013  . UTI (urinary tract infection) 12/01/2013  . Abdominal pain, unspecified site 12/01/2013  . Dry mouth 04/29/2013  . Dry eye 04/29/2013  . Dry skin 04/29/2013  . Obesity (BMI 30-39.9) 03/11/2013  . Sweating profusely 01/16/2013  . Grief 10/17/2012  . Edema 09/04/2012  . Depression 09/04/2012  . Acute bronchitis 05/30/2012  . Peripheral neuropathy ([place]) 05/12/2012  . OAB (overactive bladder) 04/01/2012  . Hypokalemia 02/06/2012  . DM w/o complication type II ([place]) 02/06/2012  . Colitis 01/21/2012  . Physical exam, annual 10/22/2011  . Allergy to dogs 04/19/2011  . Polyarthralgia 09/18/2010  . Diverticulitis 09/10/2010  . Obesity 07/25/2010  . DIVERTICULOSIS, COLON 06/02/2010  . STRESS INCONTINENCE 04/25/2010  . ASTHMA 04/11/2010  . VERTIGO 04/11/2010  . ACUTE SINUSITIS, UNSPECIFIED 04/04/2010  . Hypothyroidism 01/19/2010  . Hyperlipidemia associated with type 2 diabetes mellitus ([place]) 01/19/2010  . DEPRESSION 01/19/2010  . Essential hypertension 01/19/2010  . GERD 01/19/2010  . PEPTIC ULCER DISEASE 01/19/2010  . OSTEOARTHRITIS 01/19/2010  . LOW BACK PAIN 01/19/2010    Cadi Rhinehart 10/07/2015, 12:41 PM  [place], [place], [address] Phone: [phone]   Fax:  [phone]  Name: Tracey Evans MRN: [id number] Date of Birth: 1945/06/03    Serafina Royals,  PT 10/07/2015 12:41 PM

## 2015-10-10 ENCOUNTER — Ambulatory Visit: Payer: Medicare Other | Attending: General Surgery | Admitting: Physical Therapy

## 2015-10-10 DIAGNOSIS — R293 Abnormal posture: Secondary | ICD-10-CM | POA: Insufficient documentation

## 2015-10-10 DIAGNOSIS — M25511 Pain in right shoulder: Secondary | ICD-10-CM | POA: Diagnosis not present

## 2015-10-10 DIAGNOSIS — M25611 Stiffness of right shoulder, not elsewhere classified: Secondary | ICD-10-CM | POA: Insufficient documentation

## 2015-10-10 DIAGNOSIS — R222 Localized swelling, mass and lump, trunk: Secondary | ICD-10-CM | POA: Insufficient documentation

## 2015-10-10 DIAGNOSIS — Z483 Aftercare following surgery for neoplasm: Secondary | ICD-10-CM | POA: Diagnosis not present

## 2015-10-10 NOTE — Therapy (Signed)
[place], [place], [address] Phone: [phone]   Fax:  [phone]  Physical Therapy Treatment  Patient Details  Name: Tracey Evans MRN: [id number] Date of Birth: October 26, 1945 Referring Provider: Dr. Brion Aliment  Encounter Date: 10/10/2015      PT End of Session - 10/10/15 0852    Visit Number 9   Number of Visits 13   Date for PT Re-Evaluation 10/14/15   PT Start Time 0802   PT Stop Time 0843   PT Time Calculation (min) 41 min   Activity Tolerance Patient tolerated treatment well   Behavior During Therapy [facility] for tasks assessed/performed      Past Medical History  Diagnosis Date  . GERD (gastroesophageal reflux disease)   . Hyperlipidemia   . Hypertension   . Depression   . Fibromyalgia   . Peptic ulcer disease   . Hypothyroidism   . Chronic lower back pain   . Asthma   . Diverticular disease   . Binge eating disorder   . Costochondritis   . Anxiety   . Hot flashes   . Shortness of breath dyspnea   . Complication of anesthesia     had bronc spasms during intubation surgery 2009 on foot-need albuterol inhaler or neb tx preop  . Angina at rest [facility])     "occurs whenever it wants to, but worse during agitation"  . Chronic bronchitis ([place])   . Type II diabetes mellitus ([place])     "diet controlled" (08/15/2015)  . Osteoarthritis     "all over"  . Breast cancer of lower-outer quadrant of left female breast ([place]) 07/08/2015    "NEVER HAD LEFT BREAST CANCER" (08/15/2015)  . Cancer of right breast ([place]) 08/15/2015    Past Surgical History  Procedure Laterality Date  . Tonsillectomy    . Shoulder arthroscopy Bilateral 2009-2011    "bone spurs"  . Colonoscopy    . Ankle fracture surgery Right 2009    Dr. Rolena Infante  . Ankle arthroscopy  01/02/2012    Procedure: ANKLE ARTHROSCOPY;  Surgeon: Colin Rhein, MD;  Location: [place];  Service: Orthopedics;  Laterality:  Right;  right ankle arthroscopy with extensive debridement , dridement and drilling talar dome osteochondral lesion  . Knee arthroscopy Bilateral   . Carpal tunnel release Bilateral   . Cardiac catheterization    . Mastectomy complete / simple w/ sentinel node biopsy Right 08/15/2015  . Laparoscopic cholecystectomy    . Fracture surgery    . Dilation and curettage of uterus      "before hysterectomy"  . Abdominal hysterectomy      "left my ovaries"  . Portacath placement Left 08/15/2015  . Breast biopsy  07/2015  . Mastectomy w/ sentinel node biopsy Right 08/15/2015    Procedure: TOTAL MASTECTOMY WITH SENTINEL LYMPH NODE BIOPSY AND BLUE DYE INJECTION ;  Surgeon: Fanny Skates, MD;  Location: [place];  Service: General;  Laterality: Right;  . Portacath placement Left 08/15/2015    Procedure: INSERTION PORT-A-CATH ;  Surgeon: Fanny Skates, MD;  Location: [place];  Service: General;  Laterality: Left;    There were no vitals filed for this visit.      Subjective Assessment - 10/10/15 0805    Subjective Having a bad day because of a clump of hair in the trashcan.  Hair started falling out this weekend.  This weekend I noticed I could use my arm for reaching better, and  it didn't really hurt.   Currently in Pain? Yes   Pain Score 8    Pain Location Rib cage   Pain Orientation Right   Pain Descriptors / Indicators Other (Comment)  "just pain"   Aggravating Factors  moving the wrong way   Pain Relieving Factors medication, heat                         [facility] Adult PT Treatment/Exercise - 10/10/15 0001    Manual Therapy   Soft tissue mobilization At right axilla with work on cording; palpable mild popping sensation felt during this with no complaints from patient.   Myofascial Release Right UE myofascial pulling   Scapular Mobilization In left sidelying, right scapular protraction and depression with gentle rocking motions.   Passive ROM In supine for right shouder flexion,  abduction, and ER; supine pect minor stretches; in left sidelying, right shoulder flexion and abduction to tolerance   Prosthetics   Edema                     Short Term Clinic Goals - 09/30/15 1236    CC Short Term Goal  #1   Title Independent in a home exercise program for right shoulder ROM and in safe self-progression.   Status Partially Met           Breast Clinic Goals - 07/13/15 1339    Patient will be able to verbalize understanding of pertinent lymphedema risk reduction practices relevant to her diagnosis specifically related to skin care.   Time 1   Period Days   Status Achieved   Patient will be able to return demonstrate and/or verbalize understanding of the post-op home exercise program related to regaining shoulder range of motion.   Time 1   Period Days   Status Achieved   Patient will be able to verbalize understanding of the importance of attending the postoperative After Breast Cancer Class for further lymphedema risk reduction education and therapeutic exercise.   Time 1   Period Days   Status Achieved          Long Term Clinic Goals - 10/03/15 0931    CC Long Term Goal  #2   Title right shoulder active abduction to at least 140 degrees   Status Partially Met   CC Long Term Goal  #3   Title Right shoulder pain on movement decreased at least 50%.   Status Achieved   CC Long Term Goal  #4   Title right shoulder active abduction to at least 150 degrees   Status New   CC Long Term Goal  #5   Title right chest and axilla area perceived tightness reduced at least 60%   Status New   CC Long Term Goal  #6   Title will no longer have visible and palpable cording on abduction of right shoulder   Status New   Additional Goals   Additional Goals Yes            Plan - 10/10/15 0842    Clinical Impression Statement Patient clearly upset today about the fact that her hair started falling out, so treatment was kept passive today.  Cording  seemed to be decreased with some of the manual work.  Patient also reports doing more with her arm in ADLs.   Rehab Potential Excellent   Clinical Impairments Affecting Rehab Potential Surgery still recent (08/15/15); will start chemo 09/22/15.  PT Frequency 2x / week   PT Duration 6 weeks   PT Treatment/Interventions ADLs/Self Care Home Management;Therapeutic exercise;Patient/family education;Manual techniques;Manual lymph drainage;Scar mobilization;Passive range of motion   PT Next Visit Plan Remeasure. Add strengthening and stabilization of right shoulder. Continue ROM and soft tissue mobilization.  Will need renewal at next visit.   PT Home Exercise Plan right shoulder stretches   Consulted and Agree with Plan of Care Patient      Patient will benefit from skilled therapeutic intervention in order to improve the following deficits and impairments:  Decreased knowledge of precautions, Decreased range of motion, Increased edema, Decreased scar mobility, Impaired UE functional use  Visit Diagnosis: Stiffness of right shoulder, not elsewhere classified  Aftercare following surgery for neoplasm  Pain in right shoulder     Problem List Patient Active Problem List   Diagnosis Date Noted  . Syncope 10/05/2015  . Genetic testing 08/21/2015  . Cancer of right breast ([place]) 08/15/2015  . Family history of breast cancer in female 07/15/2015  . Family history of colon cancer 07/15/2015  . Breast cancer of lower-outer quadrant of right female breast ([place]) 07/08/2015  . Disturbance in sleep behavior 11/18/2014  . Fibromyalgia 10/04/2014  . Flu-like symptoms 07/09/2014  . Diverticulitis of colon (without mention of hemorrhage) 12/01/2013  . UTI (urinary tract infection) 12/01/2013  . Abdominal pain, unspecified site 12/01/2013  . Dry mouth 04/29/2013  . Dry eye 04/29/2013  . Dry skin 04/29/2013  . Obesity (BMI 30-39.9) 03/11/2013  . Sweating profusely 01/16/2013  . Grief 10/17/2012  .  Edema 09/04/2012  . Depression 09/04/2012  . Acute bronchitis 05/30/2012  . Peripheral neuropathy ([place]) 05/12/2012  . OAB (overactive bladder) 04/01/2012  . Hypokalemia 02/06/2012  . DM w/o complication type II ([place]) 02/06/2012  . Colitis 01/21/2012  . Physical exam, annual 10/22/2011  . Allergy to dogs 04/19/2011  . Polyarthralgia 09/18/2010  . Diverticulitis 09/10/2010  . Obesity 07/25/2010  . DIVERTICULOSIS, COLON 06/02/2010  . STRESS INCONTINENCE 04/25/2010  . ASTHMA 04/11/2010  . VERTIGO 04/11/2010  . ACUTE SINUSITIS, UNSPECIFIED 04/04/2010  . Hypothyroidism 01/19/2010  . Hyperlipidemia associated with type 2 diabetes mellitus ([place]) 01/19/2010  . DEPRESSION 01/19/2010  . Essential hypertension 01/19/2010  . GERD 01/19/2010  . PEPTIC ULCER DISEASE 01/19/2010  . OSTEOARTHRITIS 01/19/2010  . LOW BACK PAIN 01/19/2010    Thao Bauza 10/10/2015, 8:57 AM  [place], [place], [address] Phone: [phone]   Fax:  [phone]  Name: Tracey Evans MRN: [id number] Date of Birth: December 14, 1945    Serafina Royals, PT 10/10/2015 8:57 AM

## 2015-10-13 ENCOUNTER — Encounter: Payer: Self-pay | Admitting: Pharmacist

## 2015-10-13 ENCOUNTER — Ambulatory Visit (HOSPITAL_BASED_OUTPATIENT_CLINIC_OR_DEPARTMENT_OTHER): Payer: Medicare Other | Admitting: Hematology and Oncology

## 2015-10-13 ENCOUNTER — Other Ambulatory Visit: Payer: Self-pay | Admitting: Hematology and Oncology

## 2015-10-13 ENCOUNTER — Ambulatory Visit (HOSPITAL_BASED_OUTPATIENT_CLINIC_OR_DEPARTMENT_OTHER): Payer: Medicare Other

## 2015-10-13 ENCOUNTER — Other Ambulatory Visit (HOSPITAL_BASED_OUTPATIENT_CLINIC_OR_DEPARTMENT_OTHER): Payer: Medicare Other

## 2015-10-13 ENCOUNTER — Encounter: Payer: Self-pay | Admitting: Hematology and Oncology

## 2015-10-13 VITALS — BP 121/81 | HR 89 | Temp 97.8°F | Resp 18 | Ht 66.0 in | Wt 196.3 lb

## 2015-10-13 DIAGNOSIS — Z5111 Encounter for antineoplastic chemotherapy: Secondary | ICD-10-CM

## 2015-10-13 DIAGNOSIS — Z5112 Encounter for antineoplastic immunotherapy: Secondary | ICD-10-CM | POA: Diagnosis not present

## 2015-10-13 DIAGNOSIS — Z5189 Encounter for other specified aftercare: Secondary | ICD-10-CM

## 2015-10-13 DIAGNOSIS — Z170 Estrogen receptor positive status [ER+]: Secondary | ICD-10-CM

## 2015-10-13 DIAGNOSIS — C50511 Malignant neoplasm of lower-outer quadrant of right female breast: Secondary | ICD-10-CM | POA: Diagnosis not present

## 2015-10-13 LAB — CBC WITH DIFFERENTIAL/PLATELET
BASO%: 0.1 % (ref 0.0–2.0)
BASOS ABS: 0 10*3/uL (ref 0.0–0.1)
EOS ABS: 0 10*3/uL (ref 0.0–0.5)
EOS%: 0 % (ref 0.0–7.0)
HEMATOCRIT: 35.3 % (ref 34.8–46.6)
HEMOGLOBIN: 11.5 g/dL — AB (ref 11.6–15.9)
LYMPH#: 2.2 10*3/uL (ref 0.9–3.3)
LYMPH%: 13.6 % — AB (ref 14.0–49.7)
MCH: 29.7 pg (ref 25.1–34.0)
MCHC: 32.4 g/dL (ref 31.5–36.0)
MCV: 91.7 fL (ref 79.5–101.0)
MONO#: 0.6 10*3/uL (ref 0.1–0.9)
MONO%: 3.8 % (ref 0.0–14.0)
NEUT#: 13.2 10*3/uL — ABNORMAL HIGH (ref 1.5–6.5)
NEUT%: 82.5 % — AB (ref 38.4–76.8)
PLATELETS: 302 10*3/uL (ref 145–400)
RBC: 3.85 10*6/uL (ref 3.70–5.45)
RDW: 14.3 % (ref 11.2–14.5)
WBC: 16.1 10*3/uL — ABNORMAL HIGH (ref 3.9–10.3)
nRBC: 0 % (ref 0–0)

## 2015-10-13 LAB — COMPREHENSIVE METABOLIC PANEL
ALBUMIN: 3.7 g/dL (ref 3.5–5.0)
ALK PHOS: 87 U/L (ref 40–150)
ALT: 17 U/L (ref 0–55)
ANION GAP: 11 meq/L (ref 3–11)
AST: 17 U/L (ref 5–34)
BUN: 14.6 mg/dL (ref 7.0–26.0)
CALCIUM: 9.6 mg/dL (ref 8.4–10.4)
CO2: 22 mEq/L (ref 22–29)
CREATININE: 1 mg/dL (ref 0.6–1.1)
Chloride: 110 mEq/L — ABNORMAL HIGH (ref 98–109)
EGFR: 64 mL/min/{1.73_m2} — ABNORMAL LOW (ref >90)
Glucose: 134 mg/dl (ref 70–140)
POTASSIUM: 4.4 meq/L (ref 3.5–5.1)
Sodium: 143 mEq/L (ref 136–145)
Total Bilirubin: <0.3 mg/dL (ref 0.20–1.20)
Total Protein: 6.6 g/dL (ref 6.4–8.3)

## 2015-10-13 MED ORDER — SODIUM CHLORIDE 0.9 % IV SOLN
Freq: Once | INTRAVENOUS | Status: AC
  Administered 2015-10-13: 09:00:00 via INTRAVENOUS

## 2015-10-13 MED ORDER — DEXAMETHASONE SODIUM PHOSPHATE 100 MG/10ML IJ SOLN
10.0000 mg | Freq: Once | INTRAMUSCULAR | Status: AC
  Administered 2015-10-13: 10 mg via INTRAVENOUS
  Filled 2015-10-13: qty 1

## 2015-10-13 MED ORDER — SODIUM CHLORIDE 0.9 % IV SOLN
Freq: Once | INTRAVENOUS | Status: AC
  Administered 2015-10-13: 10:00:00 via INTRAVENOUS
  Filled 2015-10-13: qty 5

## 2015-10-13 MED ORDER — SODIUM CHLORIDE 0.9 % IV SOLN
75.0000 mg/m2 | Freq: Once | INTRAVENOUS | Status: AC
  Administered 2015-10-13: 160 mg via INTRAVENOUS
  Filled 2015-10-13: qty 16

## 2015-10-13 MED ORDER — DIPHENHYDRAMINE HCL 25 MG PO CAPS
50.0000 mg | ORAL_CAPSULE | Freq: Once | ORAL | Status: AC
  Administered 2015-10-13: 50 mg via ORAL

## 2015-10-13 MED ORDER — PEGFILGRASTIM 6 MG/0.6ML ~~LOC~~ PSKT
6.0000 mg | PREFILLED_SYRINGE | Freq: Once | SUBCUTANEOUS | Status: AC
  Administered 2015-10-13: 6 mg via SUBCUTANEOUS
  Filled 2015-10-13: qty 0.6

## 2015-10-13 MED ORDER — SODIUM CHLORIDE 0.9% FLUSH
10.0000 mL | INTRAVENOUS | Status: DC | PRN
  Administered 2015-10-13: 10 mL
  Filled 2015-10-13: qty 10

## 2015-10-13 MED ORDER — SODIUM CHLORIDE 0.9 % IV SOLN
615.6000 mg | Freq: Once | INTRAVENOUS | Status: AC
  Administered 2015-10-13: 620 mg via INTRAVENOUS
  Filled 2015-10-13: qty 62

## 2015-10-13 MED ORDER — DIPHENHYDRAMINE HCL 25 MG PO CAPS
ORAL_CAPSULE | ORAL | Status: AC
  Filled 2015-10-13: qty 2

## 2015-10-13 MED ORDER — ACETAMINOPHEN 325 MG PO TABS
650.0000 mg | ORAL_TABLET | Freq: Once | ORAL | Status: AC
  Administered 2015-10-13: 650 mg via ORAL

## 2015-10-13 MED ORDER — HEPARIN SOD (PORK) LOCK FLUSH 100 UNIT/ML IV SOLN
500.0000 [IU] | Freq: Once | INTRAVENOUS | Status: AC | PRN
  Administered 2015-10-13: 500 [IU]
  Filled 2015-10-13: qty 5

## 2015-10-13 MED ORDER — TRASTUZUMAB CHEMO INJECTION 440 MG
6.0000 mg/kg | Freq: Once | INTRAVENOUS | Status: AC
  Administered 2015-10-13: 546 mg via INTRAVENOUS
  Filled 2015-10-13: qty 26

## 2015-10-13 MED ORDER — PALONOSETRON HCL INJECTION 0.25 MG/5ML
0.2500 mg | Freq: Once | INTRAVENOUS | Status: AC
  Administered 2015-10-13: 0.25 mg via INTRAVENOUS

## 2015-10-13 MED ORDER — ACETAMINOPHEN 325 MG PO TABS
ORAL_TABLET | ORAL | Status: AC
  Filled 2015-10-13: qty 2

## 2015-10-13 MED ORDER — PALONOSETRON HCL INJECTION 0.25 MG/5ML
INTRAVENOUS | Status: AC
  Filled 2015-10-13: qty 5

## 2015-10-13 MED ORDER — DOCETAXEL CHEMO INJECTION 160 MG/16ML: 75.0000 mg/m2 | Freq: Once | INTRAVENOUS | Status: DC

## 2015-10-13 NOTE — Progress Notes (Signed)
Patient Care Team: Midge Minium, MD as PCP - General [name], MD as Consulting Physician (Cardiology) Marylynn Pearson, MD as Consulting Physician (Ophthalmology) Nicholas Lose, MD as Consulting Physician (Hematology and Oncology) Kyung Rudd, MD as Consulting Physician (Radiation Oncology) Fanny Skates, MD as Consulting Physician (General Surgery)  DIAGNOSIS: Breast cancer of lower-outer quadrant of right female breast [facility])   Staging form: Breast, AJCC 7th Edition     Clinical stage from 07/13/2015: Stage IA (T1c, N0, M0) - Unsigned   SUMMARY OF ONCOLOGIC HISTORY:   Breast cancer of lower-outer quadrant of right female breast ([place])   07/06/2015 Initial Diagnosis Right breast biopsy posterior: IDC ER 90%, PR 5%, Ki-67 60%, HER-2 positive ratio 1.42,copy #6.1 T1c N0 stage IA; Right breast biopsy inferior medial: High-grade DCIS with comedonecrosis; ER 100%, PR 90%; 5 mm calcs   07/27/2015 Procedure 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 Today is cycle 2. She done extremely well over the past 2 weeks and had excellent appetite and has been drinking Powerade to keep her hydrated. Does not have any nausea or vomiting. She does have very mild constipation.   REVIEW OF SYSTEMS:   Constitutional: Denies fevers, chills or abnormal weight loss Eyes: Denies blurriness of  vision Ears, nose, mouth, throat, and face: Denies mucositis or sore throat Respiratory: Denies cough, dyspnea or wheezes Cardiovascular: Denies palpitation, chest discomfort Gastrointestinal:  Denies nausea, heartburn; mild constipation Skin: Denies abnormal skin rashes Lymphatics: Denies new lymphadenopathy or easy bruising Neurological:Denies numbness, tingling or new weaknesses Behavioral/Psych: Mood is stable, no new changes  Extremities: No lower extremity edema  All other systems were reviewed with the patient and are negative.  I have reviewed the past medical history, past surgical history, social history and family history with the patient and they are unchanged from previous note.  ALLERGIES:  is allergic to contrast media; dilaudid; adhesive; codeine; and lactose intolerance (gi).  MEDICATIONS:  Current Outpatient Prescriptions  Medication Sig Dispense Refill  . albuterol (PROAIR HFA) 108 (90 BASE) MCG/ACT inhaler Inhale 2 puffs into the lungs every 6 (six) hours as needed for wheezing. 1 Inhaler 3  . ALPRAZolam (XANAX) 1 MG tablet TAKE 1 TABLET BY MOUTH TWICE DAILY AS NEEDED FOR ANXIETY 60 tablet 3  . aspirin 81 MG tablet Take 81 mg by mouth daily.    Marland Kitchen atorvastatin (LIPITOR) 20 MG tablet TAKE 1 TABLET BY MOUTH EVERY DAY 30 tablet 6  . buPROPion (WELLBUTRIN XL) 300 MG 24 hr tablet TAKE 1 TABLET BY MOUTH EVERY DAY 30 tablet 3  . dexamethasone (DECADRON) 4 MG tablet Take 1 tablet (4 mg total) by mouth 2 (two) times daily. Start the day before Taxotere. Then again the day after chemo for 1 day. 30 tablet 0  . levothyroxine (SYNTHROID, LEVOTHROID) 75 MCG tablet Take 1 tablet (75 mcg total) by mouth daily. 30 tablet 6  . lidocaine-prilocaine (EMLA) cream Apply to affected  area once 30 g 3  . LORazepam (ATIVAN) 0.5 MG tablet Take 1 tablet (0.5 mg total) by mouth at bedtime. (Patient taking differently: Take 0.5 mg by mouth at bedtime as needed for sleep. ) 30 tablet 0  . magic  mouthwash w/lidocaine SOLN 51m Swish,Swallow, or spit 4 times a day as needed 240 mL 1  . meloxicam (MOBIC) 15 MG tablet Take 15 mg by mouth daily.     . nitroGLYCERIN (NITROSTAT) 0.4 MG SL tablet Place 1 tablet (0.4 mg total) under the tongue every 5 (five) minutes as needed. As needed for chest pain 30 tablet 3  . ondansetron (ZOFRAN) 8 MG tablet Take 1 tablet (8 mg total) by mouth 2 (two) times daily as needed for refractory nausea / vomiting. Start on day 3 after chemo. (Patient taking differently: Take 8 mg by mouth 2 (two) times daily as needed for refractory nausea / vomiting. Start on day 3 after chemo. Also takes 1 tablet in the morning) 30 tablet 1  . Polyethyl Glycol-Propyl Glycol (SYSTANE ULTRA) 0.4-0.3 % SOLN Place 1 drop into both eyes 3 (three) times daily as needed (for dry eyes).     . prochlorperazine (COMPAZINE) 10 MG tablet Take 1 tablet (10 mg total) by mouth every 6 (six) hours as needed (Nausea or vomiting). (Patient taking differently: Take 10 mg by mouth at bedtime as needed for nausea or vomiting. ) 30 tablet 1  . ranitidine (ZANTAC) 150 MG tablet Take 150 mg by mouth 2 (two) times daily as needed for heartburn. Reported on 10/05/2015    . tiZANidine (ZANAFLEX) 4 MG tablet Take 4 mg by mouth every 6 (six) hours as needed for muscle spasms. Reported on 10/05/2015    . traMADol (ULTRAM) 50 MG tablet Take 50 mg by mouth daily as needed for moderate pain. Reported on 10/05/2015  0   No current facility-administered medications for this visit.    PHYSICAL EXAMINATION: ECOG PERFORMANCE STATUS: 0 - Asymptomatic  Filed Vitals:   10/13/15 0840  BP: 121/81  Pulse: 89  Temp: 97.8 F (36.6 C)  Resp: 18   Filed Weights   10/13/15 0840  Weight: 196 lb 4.8 oz (89.041 kg)    GENERAL:alert, no distress and comfortable SKIN: skin color, texture, turgor are normal, no rashes or significant lesions EYES: normal, Conjunctiva are pink and non-injected, sclera clear OROPHARYNX:no  exudate, no erythema and lips, buccal mucosa, and tongue normal  NECK: supple, thyroid normal size, non-tender, without nodularity LYMPH:  no palpable lymphadenopathy in the cervical, axillary or inguinal LUNGS: clear to auscultation and percussion with normal breathing effort HEART: regular rate & rhythm and no murmurs and no lower extremity edema ABDOMEN:abdomen soft, non-tender and normal bowel sounds MUSCULOSKELETAL:no cyanosis of digits and no clubbing  NEURO: alert & oriented x 3 with fluent speech, no focal motor/sensory deficits EXTREMITIES: No lower extremity edema  LABORATORY DATA:  I have reviewed the data as listed   Chemistry      Component Value Date/Time   NA 140 09/30/2015 0959   NA 138 09/27/2015 0657   K 4.3 09/30/2015 0959   K 2.7* 09/27/2015 0657   CL 107 09/27/2015 0657   CO2 28 09/30/2015 0959   CO2 25 09/27/2015 0657   BUN 9.2 09/30/2015 0959   BUN 15 09/27/2015 0657   CREATININE 1.4* 09/30/2015 0959   CREATININE 1.03* 09/27/2015 0657   CREATININE 0.79 05/21/2014 1610      Component Value Date/Time   CALCIUM  9.1 09/30/2015 0959   CALCIUM 7.2* 09/27/2015 0657   ALKPHOS 93 09/30/2015 0959   ALKPHOS 61 09/27/2015 0657   AST 19 09/30/2015 0959   AST 15 09/27/2015 0657   ALT 18 09/30/2015 0959   ALT 15 09/27/2015 0657   BILITOT 0.43 09/30/2015 0959   BILITOT 1.1 09/27/2015 0657       Lab Results  Component Value Date   WBC 16.1* 10/13/2015   HGB 11.5* 10/13/2015   HCT 35.3 10/13/2015   MCV 91.7 10/13/2015   PLT 302 10/13/2015   NEUTROABS 13.2* 10/13/2015     ASSESSMENT & PLAN:  Breast cancer of lower-outer quadrant of right female breast ([place]) Right mastectomy 08/15/2015: IDC grade 2, 2.2 cm, with associated DCIS intermediate grade, separate focus high-grade DCIS, 0/4 lymph nodes negative, T2 N0 stage II a, ER 90%, PR 5%, HER-2 negative duration 1.42, Ki-67 60%  Treatment plan: 1. Adjuvant chemotherapy with [place] 2. Followed by adjuvant  antiestrogen therapy with anastrozole 5 years No role of radiation since she had mastectomy. ----------------------------------------------------------------------------------------------------------------------- Current treatment: Cycle 2 day 1 TCH Blood counts were reviewed Echocardiogram was reviewed 08/25/2015: EF 65-70%  Chemotherapy toxicities: 1. Syncope: Patient went to the emergency room and was discharged home 2. Diarrhea due to chemotherapy: Encouraged her to take Imodium and given written instructions on Imodium. 3. Nausea due to chemotherapy: I will add Emend to her second cycle.  I instructed the patient to call us if she does any further diarrhea. We may then be able to prescribe her Lomotil. Return to clinic in 3 week for cycle 3 chemotherapy.    No orders of the defined types were placed in this encounter.   The patient has a good understanding of the overall plan. she agrees with it. she will call with any problems that may develop before the next visit here.   Rulon Eisenmenger, MD 10/13/2015

## 2015-10-13 NOTE — Assessment & Plan Note (Signed)
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 Adjuvant chemotherapy with [place] 2. Followed by adjuvant antiestrogen therapy with anastrozole 5 years No role of radiation since she had mastectomy. ----------------------------------------------------------------------------------------------------------------------- Current treatment: Cycle 2 day 1 TCH Blood counts were reviewed Echocardiogram was reviewed 08/25/2015: EF 65-70%  Chemotherapy toxicities: 1. Syncope: Patient went to the emergency room and was discharged home 2. Diarrhea due to chemotherapy: Encouraged her to take Imodium and given written instructions on Imodium. 3. Nausea due to chemotherapy: I will add Emend to her second cycle.  I instructed the patient to call us if she does any further diarrhea. We may then be able to prescribe her Lomotil. Return to clinic in 3 week for cycle 3 chemotherapy.

## 2015-10-13 NOTE — Patient Instructions (Signed)
[facility] Discharge Instructions for Patients Receiving Chemotherapy  Today you received the following chemotherapy agents: Herceptin, Taxotere, Carboplatin.  To help prevent nausea and vomiting after your treatment, we encourage you to take your nausea medication: Compazine 10 mg every 6 hours as needed; Zofran 8 mg every 12 hours as needed.   If you develop nausea and vomiting that is not controlled by your nausea medication, call the clinic.   BELOW ARE SYMPTOMS THAT SHOULD BE REPORTED IMMEDIATELY:  *FEVER GREATER THAN 100.5 F  *CHILLS WITH OR WITHOUT FEVER  NAUSEA AND VOMITING THAT IS NOT CONTROLLED WITH YOUR NAUSEA MEDICATION  *UNUSUAL SHORTNESS OF BREATH  *UNUSUAL BRUISING OR BLEEDING  TENDERNESS IN MOUTH AND THROAT WITH OR WITHOUT PRESENCE OF ULCERS  *URINARY PROBLEMS  *BOWEL PROBLEMS  UNUSUAL RASH Items with * indicate a potential emergency and should be followed up as soon as possible.  Feel free to call the clinic you have any questions or concerns. The clinic phone number is [phone].  Please show the [facility] at check-in to the Emergency Department and triage nurse.

## 2015-10-14 ENCOUNTER — Ambulatory Visit: Payer: Medicare Other | Admitting: Physical Therapy

## 2015-10-14 DIAGNOSIS — R222 Localized swelling, mass and lump, trunk: Secondary | ICD-10-CM | POA: Diagnosis not present

## 2015-10-14 DIAGNOSIS — M25511 Pain in right shoulder: Secondary | ICD-10-CM

## 2015-10-14 DIAGNOSIS — Z483 Aftercare following surgery for neoplasm: Secondary | ICD-10-CM

## 2015-10-14 DIAGNOSIS — R293 Abnormal posture: Secondary | ICD-10-CM | POA: Diagnosis not present

## 2015-10-14 DIAGNOSIS — M25611 Stiffness of right shoulder, not elsewhere classified: Secondary | ICD-10-CM

## 2015-10-14 NOTE — Therapy (Signed)
[place], [place], [address] Phone: [phone]   Fax:  [phone]  Physical Therapy Treatment  Patient Details  Name: Tracey Evans MRN: [id number] Date of Birth: 1945/07/13 Referring Provider: Dr. Brion Aliment  Encounter Date: 10/14/2015      PT End of Session - 10/14/15 1255    Visit Number 10   Number of Visits 18   Date for PT Re-Evaluation 11/13/15   PT Start Time 0904  patient late today   PT Stop Time 0934   PT Time Calculation (min) 30 min   Activity Tolerance Patient tolerated treatment well   Behavior During Therapy [facility] for tasks assessed/performed      Past Medical History  Diagnosis Date  . GERD (gastroesophageal reflux disease)   . Hyperlipidemia   . Hypertension   . Depression   . Fibromyalgia   . Peptic ulcer disease   . Hypothyroidism   . Chronic lower back pain   . Asthma   . Diverticular disease   . Binge eating disorder   . Costochondritis   . Anxiety   . Hot flashes   . Shortness of breath dyspnea   . Complication of anesthesia     had bronc spasms during intubation surgery 2009 on foot-need albuterol inhaler or neb tx preop  . Angina at rest [facility])     "occurs whenever it wants to, but worse during agitation"  . Chronic bronchitis ([place])   . Type II diabetes mellitus ([place])     "diet controlled" (08/15/2015)  . Osteoarthritis     "all over"  . Breast cancer of lower-outer quadrant of left female breast ([place]) 07/08/2015    "NEVER HAD LEFT BREAST CANCER" (08/15/2015)  . Cancer of right breast ([place]) 08/15/2015    Past Surgical History  Procedure Laterality Date  . Tonsillectomy    . Shoulder arthroscopy Bilateral 2009-2011    "bone spurs"  . Colonoscopy    . Ankle fracture surgery Right 2009    Dr. Rolena Infante  . Ankle arthroscopy  01/02/2012    Procedure: ANKLE ARTHROSCOPY;  Surgeon: Colin Rhein, MD;  Location: [facility];  Service:  Orthopedics;  Laterality: Right;  right ankle arthroscopy with extensive debridement , dridement and drilling talar dome osteochondral lesion  . Knee arthroscopy Bilateral   . Carpal tunnel release Bilateral   . Cardiac catheterization    . Mastectomy complete / simple w/ sentinel node biopsy Right 08/15/2015  . Laparoscopic cholecystectomy    . Fracture surgery    . Dilation and curettage of uterus      "before hysterectomy"  . Abdominal hysterectomy      "left my ovaries"  . Portacath placement Left 08/15/2015  . Breast biopsy  07/2015  . Mastectomy w/ sentinel node biopsy Right 08/15/2015    Procedure: TOTAL MASTECTOMY WITH SENTINEL LYMPH NODE BIOPSY AND BLUE DYE INJECTION ;  Surgeon: Fanny Skates, MD;  Location: [facility];  Service: General;  Laterality: Right;  . Portacath placement Left 08/15/2015    Procedure: INSERTION PORT-A-CATH ;  Surgeon: Fanny Skates, MD;  Location: [facility];  Service: General;  Laterality: Left;    There were no vitals filed for this visit.      Subjective Assessment - 10/14/15 0909    Subjective Had a good chemo yesterday--it only lasted four hours, and I slept through it.  Then slept after that all night.   Currently in Pain? No/denies  [facility] Adult PT Treatment/Exercise - 10/14/15 0001    Manual Therapy   Soft tissue mobilization At right axilla with work on cording; at right chest all around incision   Myofascial Release crosshands technique in vertical, horizontal, and diagonal aat right chest   Passive ROM In supine for left shoulder ER, abduction, and flexion with stretch to patient tolerance.                   Short Term Clinic Goals - 09/30/15 1236    CC Short Term Goal  #1   Title Independent in a home exercise program for right shoulder ROM and in safe self-progression.   Status Partially Met           Breast Clinic Goals - 07/13/15 1339    Patient will be able to verbalize understanding of  pertinent lymphedema risk reduction practices relevant to her diagnosis specifically related to skin care.   Time 1   Period Days   Status Achieved   Patient will be able to return demonstrate and/or verbalize understanding of the post-op home exercise program related to regaining shoulder range of motion.   Time 1   Period Days   Status Achieved   Patient will be able to verbalize understanding of the importance of attending the postoperative After Breast Cancer Class for further lymphedema risk reduction education and therapeutic exercise.   Time 1   Period Days   Status Achieved          Long Term Clinic Goals - 10/14/15 1258    CC Long Term Goal  #1   Title Right shoulder active flexion to at least 125 degrees for improved overhead reach.   Status Achieved   CC Long Term Goal  #2   Title right shoulder active abduction to at least 140 degrees   Status Partially Met   CC Long Term Goal  #3   Title Right shoulder pain on movement decreased at least 50%.   Status Achieved   CC Long Term Goal  #4   Title right shoulder active abduction to at least 150 degrees   Status On-going   CC Long Term Goal  #5   Title right chest and axilla area perceived tightness reduced at least 60%   Status On-going   CC Long Term Goal  #6   Title will no longer have visible and palpable cording on abduction of right shoulder   Status On-going            Plan - 10/14/15 1256    Clinical Impression Statement Patient's mood better today, and cording has decreased, though still visible and palpable in right axilla with shoulder abduction.  Chest tissue seemed to loosen with soft tissue mobilization and myofascial work today.   Rehab Potential Excellent   Clinical Impairments Affecting Rehab Potential Surgery still recent (08/15/15); will start chemo 09/22/15.   PT Frequency 2x / week   PT Duration 4 weeks   PT Treatment/Interventions ADLs/Self Care Home Management;Therapeutic  exercise;Patient/family education;Manual techniques;Manual lymph drainage;Scar mobilization;Passive range of motion   PT Next Visit Plan Remeasure.  Add strengthening and stabilization of right shoulder. Continue ROM and soft tissue mobilization.   PT Home Exercise Plan right shoulder stretches   Consulted and Agree with Plan of Care Patient      Patient will benefit from skilled therapeutic intervention in order to improve the following deficits and impairments:  Decreased knowledge of precautions, Decreased range of motion, Increased edema, Decreased  scar mobility, Impaired UE functional use  Visit Diagnosis: Stiffness of right shoulder, not elsewhere classified - Plan: PT plan of care cert/re-cert  Aftercare following surgery for neoplasm - Plan: PT plan of care cert/re-cert  Pain in right shoulder - Plan: PT plan of care cert/re-cert  Localized swelling, mass and lump, trunk - Plan: PT plan of care cert/re-cert  Abnormal posture - Plan: PT plan of care cert/re-cert       G-Codes - 16-Oct-2015 1259    Functional Assessment Tool Used clinical judgement   Functional Limitation Carrying, moving and handling objects   Carrying, Moving and Handling Objects Current Status (W5462) At least 1 percent but less than 20 percent impaired, limited or restricted   Carrying, Moving and Handling Objects Goal Status (V0350) At least 1 percent but less than 20 percent impaired, limited or restricted      Problem List Patient Active Problem List   Diagnosis Date Noted  . Syncope 10/05/2015  . Genetic testing 08/21/2015  . Cancer of right breast ([place]) 08/15/2015  . Family history of breast cancer in female 07/15/2015  . Family history of colon cancer 07/15/2015  . Breast cancer of lower-outer quadrant of right female breast ([place]) 07/08/2015  . Disturbance in sleep behavior 11/18/2014  . Fibromyalgia 10/04/2014  . Flu-like symptoms 07/09/2014  . Diverticulitis of colon (without mention of  hemorrhage) 12/01/2013  . UTI (urinary tract infection) 12/01/2013  . Abdominal pain, unspecified site 12/01/2013  . Dry mouth 04/29/2013  . Dry eye 04/29/2013  . Dry skin 04/29/2013  . Obesity (BMI 30-39.9) 03/11/2013  . Sweating profusely 01/16/2013  . Grief 10/17/2012  . Edema 09/04/2012  . Depression 09/04/2012  . Acute bronchitis 05/30/2012  . Peripheral neuropathy ([place]) 05/12/2012  . OAB (overactive bladder) 04/01/2012  . Hypokalemia 02/06/2012  . DM w/o complication type II ([place]) 02/06/2012  . Colitis 01/21/2012  . Physical exam, annual 10/22/2011  . Allergy to dogs 04/19/2011  . Polyarthralgia 09/18/2010  . Diverticulitis 09/10/2010  . Obesity 07/25/2010  . DIVERTICULOSIS, COLON 06/02/2010  . STRESS INCONTINENCE 04/25/2010  . ASTHMA 04/11/2010  . VERTIGO 04/11/2010  . ACUTE SINUSITIS, UNSPECIFIED 04/04/2010  . Hypothyroidism 01/19/2010  . Hyperlipidemia associated with type 2 diabetes mellitus ([place]) 01/19/2010  . DEPRESSION 01/19/2010  . Essential hypertension 01/19/2010  . GERD 01/19/2010  . PEPTIC ULCER DISEASE 01/19/2010  . OSTEOARTHRITIS 01/19/2010  . LOW BACK PAIN 01/19/2010    Torrey Ballinas 10-16-15, 1:01 PM  [place], [place], [address] Phone: [phone]   Fax:  [phone]  Name: Tracey Evans MRN: [id number] Date of Birth: 1946/01/13    Serafina Royals, PT 2015-10-16 1:01 PM

## 2015-10-17 ENCOUNTER — Encounter: Payer: Self-pay | Admitting: Hematology and Oncology

## 2015-10-17 ENCOUNTER — Ambulatory Visit: Payer: Medicare Other | Admitting: Physical Therapy

## 2015-10-17 ENCOUNTER — Other Ambulatory Visit (HOSPITAL_BASED_OUTPATIENT_CLINIC_OR_DEPARTMENT_OTHER): Payer: Medicare Other

## 2015-10-17 ENCOUNTER — Ambulatory Visit (HOSPITAL_BASED_OUTPATIENT_CLINIC_OR_DEPARTMENT_OTHER): Payer: Medicare Other | Admitting: Hematology and Oncology

## 2015-10-17 ENCOUNTER — Other Ambulatory Visit: Payer: Self-pay

## 2015-10-17 ENCOUNTER — Telehealth: Payer: Self-pay | Admitting: *Deleted

## 2015-10-17 ENCOUNTER — Ambulatory Visit (HOSPITAL_BASED_OUTPATIENT_CLINIC_OR_DEPARTMENT_OTHER): Payer: Medicare Other

## 2015-10-17 VITALS — BP 122/76 | HR 100 | Temp 98.7°F | Resp 18 | Ht 66.0 in | Wt 191.2 lb

## 2015-10-17 VITALS — BP 108/74 | HR 97 | Temp 99.0°F | Resp 18

## 2015-10-17 DIAGNOSIS — Z170 Estrogen receptor positive status [ER+]: Secondary | ICD-10-CM

## 2015-10-17 DIAGNOSIS — R112 Nausea with vomiting, unspecified: Secondary | ICD-10-CM

## 2015-10-17 DIAGNOSIS — C50511 Malignant neoplasm of lower-outer quadrant of right female breast: Secondary | ICD-10-CM | POA: Diagnosis not present

## 2015-10-17 DIAGNOSIS — R197 Diarrhea, unspecified: Secondary | ICD-10-CM

## 2015-10-17 DIAGNOSIS — R630 Anorexia: Secondary | ICD-10-CM

## 2015-10-17 LAB — COMPREHENSIVE METABOLIC PANEL
ALT: 35 U/L (ref 0–55)
AST: 33 U/L (ref 5–34)
Albumin: 3.7 g/dL (ref 3.5–5.0)
Alkaline Phosphatase: 123 U/L (ref 40–150)
Anion Gap: 11 mEq/L (ref 3–11)
BUN: 17.1 mg/dL (ref 7.0–26.0)
CALCIUM: 9.1 mg/dL (ref 8.4–10.4)
CHLORIDE: 98 meq/L (ref 98–109)
CO2: 27 mEq/L (ref 22–29)
Creatinine: 1 mg/dL (ref 0.6–1.1)
EGFR: 65 mL/min/{1.73_m2} — AB (ref >90)
Glucose: 161 mg/dl — ABNORMAL HIGH (ref 70–140)
POTASSIUM: 4.1 meq/L (ref 3.5–5.1)
Sodium: 136 mEq/L (ref 136–145)
Total Bilirubin: 1.49 mg/dL — ABNORMAL HIGH (ref 0.20–1.20)
Total Protein: 6.7 g/dL (ref 6.4–8.3)

## 2015-10-17 LAB — CBC WITH DIFFERENTIAL/PLATELET
BASO%: 0.5 % (ref 0.0–2.0)
BASOS ABS: 0.1 10*3/uL (ref 0.0–0.1)
EOS%: 2.8 % (ref 0.0–7.0)
Eosinophils Absolute: 0.5 10*3/uL (ref 0.0–0.5)
HCT: 38.6 % (ref 34.8–46.6)
HGB: 12.8 g/dL (ref 11.6–15.9)
LYMPH%: 5 % — AB (ref 14.0–49.7)
MCH: 29.9 pg (ref 25.1–34.0)
MCHC: 33 g/dL (ref 31.5–36.0)
MCV: 90.6 fL (ref 79.5–101.0)
MONO#: 0.2 10*3/uL (ref 0.1–0.9)
MONO%: 1 % (ref 0.0–14.0)
NEUT#: 16.7 10*3/uL — ABNORMAL HIGH (ref 1.5–6.5)
NEUT%: 90.7 % — AB (ref 38.4–76.8)
PLATELETS: 263 10*3/uL (ref 145–400)
RBC: 4.26 10*6/uL (ref 3.70–5.45)
RDW: 14.6 % — ABNORMAL HIGH (ref 11.2–14.5)
WBC: 18.5 10*3/uL — ABNORMAL HIGH (ref 3.9–10.3)
lymph#: 0.9 10*3/uL (ref 0.9–3.3)

## 2015-10-17 MED ORDER — SODIUM CHLORIDE 0.9 % IV SOLN
Freq: Once | INTRAVENOUS | Status: AC
  Administered 2015-10-17: 13:00:00 via INTRAVENOUS
  Filled 2015-10-17: qty 4

## 2015-10-17 MED ORDER — SODIUM CHLORIDE 0.9 % IV SOLN
Freq: Once | INTRAVENOUS | Status: AC
  Administered 2015-10-17: 12:00:00 via INTRAVENOUS

## 2015-10-17 MED ORDER — HEPARIN SOD (PORK) LOCK FLUSH 100 UNIT/ML IV SOLN
500.0000 [IU] | Freq: Once | INTRAVENOUS | Status: AC | PRN
  Administered 2015-10-17: 500 [IU]
  Filled 2015-10-17: qty 5

## 2015-10-17 MED ORDER — SODIUM CHLORIDE 0.9 % IJ SOLN
10.0000 mL | INTRAMUSCULAR | Status: DC | PRN
  Administered 2015-10-17: 10 mL
  Filled 2015-10-17: qty 10

## 2015-10-17 NOTE — Assessment & Plan Note (Signed)
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 Adjuvant chemotherapy with [place] 2. Followed by adjuvant antiestrogen therapy with anastrozole 5 years No role of radiation since she had mastectomy. ----------------------------------------------------------------------------------------------------------------------- Current treatment: Cycle 2 day 1 TCH Echocardiogram was reviewed 08/25/2015: EF 65-70%  Chemotherapy toxicities: 1. Syncope: Patient went to the emergency room and was discharged home, no further problems since then 2. Diarrhea due to chemotherapy: Encouraged her to take Imodium and given written instructions on Imodium. She did not have diarrhea with cycle 2. 3. Nausea and vomiting due to chemotherapy: In spite of adding Emend, patient had persistent nausea and vomiting since the last 24 hours. We will administer IV Zofran along with IV fluids today. I plan to decrease the dosage of Cycle of chemotherapy. 4. Decreased appetite 5. Insomnia  Return to clinic with cycle 3.

## 2015-10-17 NOTE — Progress Notes (Signed)
Patient Care Team: Midge Minium, MD as PCP - General [name], MD as Consulting Physician (Cardiology) Marylynn Pearson, MD as Consulting Physician (Ophthalmology) Nicholas Lose, MD as Consulting Physician (Hematology and Oncology) Kyung Rudd, MD as Consulting Physician (Radiation Oncology) Fanny Skates, MD as Consulting Physician (General Surgery)  DIAGNOSIS: Breast cancer of lower-outer quadrant of right female breast [facility])   Staging form: Breast, AJCC 7th Edition     Clinical stage from 07/13/2015: Stage IA (T1c, N0, M0) - Unsigned   SUMMARY OF ONCOLOGIC HISTORY:   Breast cancer of lower-outer quadrant of right female breast ([place])   07/06/2015 Initial Diagnosis Right breast biopsy posterior: IDC ER 90%, PR 5%, Ki-67 60%, HER-2 positive ratio 1.42,copy #6.1 T1c N0 stage IA; Right breast biopsy inferior medial: High-grade DCIS with comedonecrosis; ER 100%, PR 90%; 5 mm calcs   07/27/2015 Procedure 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, heterozygous   08/15/2015 Surgery Right mastectomy: IDC grade 2, 2.2 cm, with associated DCIS intermediate grade, separate focus high-grade DCIS, 0/4 lymph nodes negative, T2 N0 stage II a, ER 90%, PR 5%, HER-2 negative duration 1.42, Ki-67 60%   09/22/2015 -  Chemotherapy Adjuvant chemotherapy with TCH 6 cycles followed by Herceptin maintenance for 1 year    CHIEF COMPLIANT: Nausea vomiting since 24 hours  INTERVAL HISTORY: Tracey Evans is a 70 year old above-mentioned history of right breast cancer currently on adjuvant chemotherapy and had received cycle 2 last week and starting yesterday she started having intractable nausea and vomiting. She came into the clinic for IV hydration and antiemetics. She is complaining of poor taste and lack of appetite. She is also having problems with insomnia. Also food does not taste good.  REVIEW OF SYSTEMS:   Constitutional: Denies fevers, chills or  abnormal weight loss Eyes: Denies blurriness of vision Ears, nose, mouth, throat, and face: Denies mucositis or sore throat Respiratory: Denies cough, dyspnea or wheezes Cardiovascular: Denies palpitation, chest discomfort Gastrointestinal:  Intractable nausea and vomiting. Skin: Denies abnormal skin rashes Lymphatics: Denies new lymphadenopathy or easy bruising Neurological:Denies numbness, tingling or new weaknesses Behavioral/Psych: Mood is stable, no new changes  Extremities: No lower extremity edema  All other systems were reviewed with the patient and are negative.  I have reviewed the past medical history, past surgical history, social history and family history with the patient and they are unchanged from previous note.  ALLERGIES:  is allergic to contrast media; dilaudid; adhesive; codeine; and lactose intolerance (gi).  MEDICATIONS:  Current Outpatient Prescriptions  Medication Sig Dispense Refill  . albuterol (PROAIR HFA) 108 (90 BASE) MCG/ACT inhaler Inhale 2 puffs into the lungs every 6 (six) hours as needed for wheezing. 1 Inhaler 3  . ALPRAZolam (XANAX) 1 MG tablet TAKE 1 TABLET BY MOUTH TWICE DAILY AS NEEDED FOR ANXIETY 60 tablet 3  . aspirin 81 MG tablet Take 81 mg by mouth daily.    Marland Kitchen atorvastatin (LIPITOR) 20 MG tablet TAKE 1 TABLET BY MOUTH EVERY DAY 30 tablet 6  . buPROPion (WELLBUTRIN XL) 300 MG 24 hr tablet TAKE 1 TABLET BY MOUTH EVERY DAY 30 tablet 3  . dexamethasone (DECADRON) 4 MG tablet Take 1 tablet (4 mg total) by mouth 2 (two) times daily. Start the day before Taxotere. Then again the day after chemo for 1 day. 30 tablet 0  . levothyroxine (SYNTHROID, LEVOTHROID) 75 MCG tablet Take 1 tablet (75 mcg total) by mouth daily. 30 tablet 6  . lidocaine-prilocaine (EMLA) cream  Apply to affected area once 30 g 3  . LORazepam (ATIVAN) 0.5 MG tablet Take 1 tablet (0.5 mg total) by mouth at bedtime. (Patient taking differently: Take 0.5 mg by mouth at bedtime as needed  for sleep. ) 30 tablet 0  . magic mouthwash w/lidocaine SOLN 83m Swish,Swallow, or spit 4 times a day as needed 240 mL 1  . meloxicam (MOBIC) 15 MG tablet Take 15 mg by mouth daily.     . nitroGLYCERIN (NITROSTAT) 0.4 MG SL tablet Place 1 tablet (0.4 mg total) under the tongue every 5 (five) minutes as needed. As needed for chest pain 30 tablet 3  . ondansetron (ZOFRAN) 8 MG tablet Take 1 tablet (8 mg total) by mouth 2 (two) times daily as needed for refractory nausea / vomiting. Start on day 3 after chemo. (Patient taking differently: Take 8 mg by mouth 2 (two) times daily as needed for refractory nausea / vomiting. Start on day 3 after chemo. Also takes 1 tablet in the morning) 30 tablet 1  . Polyethyl Glycol-Propyl Glycol (SYSTANE ULTRA) 0.4-0.3 % SOLN Place 1 drop into both eyes 3 (three) times daily as needed (for dry eyes).     . prochlorperazine (COMPAZINE) 10 MG tablet Take 1 tablet (10 mg total) by mouth every 6 (six) hours as needed (Nausea or vomiting). (Patient taking differently: Take 10 mg by mouth at bedtime as needed for nausea or vomiting. ) 30 tablet 1  . ranitidine (ZANTAC) 150 MG tablet Take 150 mg by mouth 2 (two) times daily as needed for heartburn. Reported on 10/05/2015    . tiZANidine (ZANAFLEX) 4 MG tablet Take 4 mg by mouth every 6 (six) hours as needed for muscle spasms. Reported on 10/05/2015    . traMADol (ULTRAM) 50 MG tablet Take 50 mg by mouth daily as needed for moderate pain. Reported on 10/05/2015  0   No current facility-administered medications for this visit.    PHYSICAL EXAMINATION: ECOG PERFORMANCE STATUS: 1 - Symptomatic but completely ambulatory  Filed Vitals:   10/17/15 1144  BP: 122/76  Pulse: 100  Temp: 98.7 F (37.1 C)  Resp: 18   Filed Weights   10/17/15 1144  Weight: 191 lb 3.2 oz (86.728 kg)    GENERAL:alert, no distress and comfortable SKIN: skin color, texture, turgor are normal, no rashes or significant lesions EYES: normal,  Conjunctiva are pink and non-injected, sclera clear OROPHARYNX:no exudate, no erythema and lips, buccal mucosa, and tongue normal  NECK: supple, thyroid normal size, non-tender, without nodularity LYMPH:  no palpable lymphadenopathy in the cervical, axillary or inguinal LUNGS: clear to auscultation and percussion with normal breathing effort HEART: regular rate & rhythm and no murmurs and no lower extremity edema ABDOMEN:abdomen soft, non-tender and normal bowel sounds MUSCULOSKELETAL:no cyanosis of digits and no clubbing  NEURO: alert & oriented x 3 with fluent speech, no focal motor/sensory deficits EXTREMITIES: No lower extremity edema  LABORATORY DATA:  I have reviewed the data as listed   Chemistry      Component Value Date/Time   NA 136 10/17/2015 1135   NA 138 09/27/2015 0657   K 4.1 10/17/2015 1135   K 2.7* 09/27/2015 0657   CL 107 09/27/2015 0657   CO2 27 10/17/2015 1135   CO2 25 09/27/2015 0657   BUN 17.1 10/17/2015 1135   BUN 15 09/27/2015 0657   CREATININE 1.0 10/17/2015 1135   CREATININE 1.03* 09/27/2015 0657   CREATININE 0.79 05/21/2014 1610  Component Value Date/Time   CALCIUM 9.1 10/17/2015 1135   CALCIUM 7.2* 09/27/2015 0657   ALKPHOS 123 10/17/2015 1135   ALKPHOS 61 09/27/2015 0657   AST 33 10/17/2015 1135   AST 15 09/27/2015 0657   ALT 35 10/17/2015 1135   ALT 15 09/27/2015 0657   BILITOT 1.49* 10/17/2015 1135   BILITOT 1.1 09/27/2015 0657       Lab Results  Component Value Date   WBC 18.5* 10/17/2015   HGB 12.8 10/17/2015   HCT 38.6 10/17/2015   MCV 90.6 10/17/2015   PLT 263 10/17/2015   NEUTROABS 16.7* 10/17/2015     ASSESSMENT & PLAN:  Breast cancer of lower-outer quadrant of right female breast ([place]) Right mastectomy 08/15/2015: IDC grade 2, 2.2 cm, with associated DCIS intermediate grade, separate focus high-grade DCIS, 0/4 lymph nodes negative, T2 N0 stage II a, ER 90%, PR 5%, HER-2 negative duration 1.42, Ki-67 60%  Treatment  plan: 1. Adjuvant chemotherapy with [place] 2. Followed by adjuvant antiestrogen therapy with anastrozole 5 years No role of radiation since she had mastectomy. ----------------------------------------------------------------------------------------------------------------------- Current treatment: Cycle 2 day 1 TCH Echocardiogram was reviewed 08/25/2015: EF 65-70%  Chemotherapy toxicities: 1. Syncope: Patient went to the emergency room and was discharged home, no further problems since then 2. Diarrhea due to chemotherapy: Encouraged her to take Imodium and given written instructions on Imodium. She did not have diarrhea with cycle 2. 3. Nausea and vomiting due to chemotherapy: In spite of adding [place], patient had persistent nausea and vomiting since the last 24 hours. We will administer IV Zofran along with IV fluids today. I plan to decrease the dosage of Cycle of chemotherapy. 4. Decreased appetite 5. Insomnia  Return to clinic with cycle 3.   No orders of the defined types were placed in this encounter.   The patient has a good understanding of the overall plan. she agrees with it. she will call with any problems that may develop before the next visit here.   Rulon Eisenmenger, MD 10/17/2015

## 2015-10-17 NOTE — Telephone Encounter (Signed)
VM message received from patient stating that she was having nausea and vomiting that was not relieved with her meds.   TC back to patient . She states this nausea and vomiting has been going on since yesterday and her zofran and compazine has not helped at all. Denies fever, chills, diarrhea.  She has had 3 small bowel movements-soft, formed in the last 24 hours.  She is not able to keep any fluids/food down. She states she feels weak but is able to come in for urgent visit with either Dr. Lindi Adie or Selena Lesser, NP in [place] with Karna Christmas , [place] with Dr. Lindi Adie. Dr. Lindi Adie will see her.   Lab visit MD visit, IVF visit etc orders placed per Karna Christmas, RN

## 2015-10-17 NOTE — Patient Instructions (Signed)

## 2015-10-20 ENCOUNTER — Encounter: Payer: Self-pay | Admitting: *Deleted

## 2015-10-20 NOTE — Progress Notes (Signed)
[facility] Work  Clinical Social Work was referred by patient for assessment of psychosocial needs due to "mood swings".  Clinical Social Worker contacted patient at home to offer support and assess for needs. CSW and pt spoke at length re. Labile emotions, adjustment to illness and coping. Pt adjusting to the many side effects from treatment and also adjusting to her significant other's reactions to her cancer. Pt has a 70 yo niece that is coming to stay with her some next week and is very excited about this. Pt reports to cope through listening to music and doing crochet. She has not felt up to doing her usual activities, per her report. She is open to exploring some new coping techniques and CSW to mail her calendar and options for support. Pt very interested in massage therapy as well. CSW and pt to meet next week to review coping/emotions and resources for additional financial support. CSW to follow and assist.     Loren Racer, [place] Worker [place]  [facility] Phone: [phone] Fax: [phone]

## 2015-10-21 ENCOUNTER — Ambulatory Visit: Payer: Medicare Other | Admitting: Physical Therapy

## 2015-10-21 DIAGNOSIS — Z483 Aftercare following surgery for neoplasm: Secondary | ICD-10-CM

## 2015-10-21 DIAGNOSIS — M25511 Pain in right shoulder: Secondary | ICD-10-CM | POA: Diagnosis not present

## 2015-10-21 DIAGNOSIS — M25611 Stiffness of right shoulder, not elsewhere classified: Secondary | ICD-10-CM | POA: Diagnosis not present

## 2015-10-21 DIAGNOSIS — R293 Abnormal posture: Secondary | ICD-10-CM | POA: Diagnosis not present

## 2015-10-21 DIAGNOSIS — R222 Localized swelling, mass and lump, trunk: Secondary | ICD-10-CM | POA: Diagnosis not present

## 2015-10-21 NOTE — Patient Instructions (Signed)
Over Head Pull: Narrow Grip     [id number]   On back, knees bent, feet flat, band across thighs, elbows straight but relaxed. Pull hands apart (start). Keeping elbows straight, bring arms up and over head, hands toward floor. Keep pull steady on band. Hold momentarily. Return slowly, keeping pull steady, back to start. Repeat _10__ times. Band color __green____   Side Pull: Double Arm   On back, knees bent, feet flat. Arms perpendicular to body, shoulder level, elbows straight but relaxed. Pull arms out to sides, elbows straight. Resistance band comes across collarbones, hands toward floor. Hold momentarily. Slowly return to starting position. Repeat _10__ times. Band color __green___   Sash   On back, knees bent, feet flat, left hand on left hip, right hand above left. Pull right arm DIAGONALLY (hip to shoulder) across chest. Bring right arm along head toward floor. Hold momentarily. Slowly return to starting position. Repeat __10_ times. Do with left arm. Band color __green____   Shoulder Rotation: Double Arm   On back, knees bent, feet flat, elbows tucked at sides, bent 90, hands palms up. Pull hands apart and down toward floor, keeping elbows near sides. Hold momentarily. Slowly return to starting position. Repeat _10__ times. Band color __green____

## 2015-10-21 NOTE — Therapy (Signed)
[place], [place], [address] Phone: [phone]   Fax:  [phone]  Physical Therapy Treatment  Patient Details  Name: Tracey Evans MRN: [id number] Date of Birth: 11-May-1945 Referring Provider: Dr. Brion Aliment  Encounter Date: 10/21/2015      PT End of Session - 10/21/15 1247    Visit Number 11   Number of Visits 18   Date for PT Re-Evaluation 11/13/15   PT Start Time 0846   PT Stop Time 0930   PT Time Calculation (min) 44 min   Activity Tolerance Patient tolerated treatment well   Behavior During Therapy [facility] for tasks assessed/performed      Past Medical History  Diagnosis Date  . GERD (gastroesophageal reflux disease)   . Hyperlipidemia   . Hypertension   . Depression   . Fibromyalgia   . Peptic ulcer disease   . Hypothyroidism   . Chronic lower back pain   . Asthma   . Diverticular disease   . Binge eating disorder   . Costochondritis   . Anxiety   . Hot flashes   . Shortness of breath dyspnea   . Complication of anesthesia     had bronc spasms during intubation surgery 2009 on foot-need albuterol inhaler or neb tx preop  . Angina at rest [facility])     "occurs whenever it wants to, but worse during agitation"  . Chronic bronchitis ([place])   . Type II diabetes mellitus ([place])     "diet controlled" (08/15/2015)  . Osteoarthritis     "all over"  . Breast cancer of lower-outer quadrant of left female breast ([place]) 07/08/2015    "NEVER HAD LEFT BREAST CANCER" (08/15/2015)  . Cancer of right breast ([place]) 08/15/2015    Past Surgical History  Procedure Laterality Date  . Tonsillectomy    . Shoulder arthroscopy Bilateral 2009-2011    "bone spurs"  . Colonoscopy    . Ankle fracture surgery Right 2009    Dr. Rolena Infante  . Ankle arthroscopy  01/02/2012    Procedure: ANKLE ARTHROSCOPY;  Surgeon: Colin Rhein, MD;  Location: [place];  Service: Orthopedics;  Laterality:  Right;  right ankle arthroscopy with extensive debridement , dridement and drilling talar dome osteochondral lesion  . Knee arthroscopy Bilateral   . Carpal tunnel release Bilateral   . Cardiac catheterization    . Mastectomy complete / simple w/ sentinel node biopsy Right 08/15/2015  . Laparoscopic cholecystectomy    . Fracture surgery    . Dilation and curettage of uterus      "before hysterectomy"  . Abdominal hysterectomy      "left my ovaries"  . Portacath placement Left 08/15/2015  . Breast biopsy  07/2015  . Mastectomy w/ sentinel node biopsy Right 08/15/2015    Procedure: TOTAL MASTECTOMY WITH SENTINEL LYMPH NODE BIOPSY AND BLUE DYE INJECTION ;  Surgeon: Fanny Skates, MD;  Location: [place];  Service: General;  Laterality: Right;  . Portacath placement Left 08/15/2015    Procedure: INSERTION PORT-A-CATH ;  Surgeon: Fanny Skates, MD;  Location: [place];  Service: General;  Laterality: Left;    There were no vitals filed for this visit.      Subjective Assessment - 10/21/15 0851    Subjective Was back at the hospital on Monday for nausea and fluids.  Dr. Lindi Adie said he's going to have reduce the dose.  Couldn't eat her cooking, but ate scrambled eggs.   Currently  in Pain? No/denies            [facility] PT Assessment - 10/21/15 0001    AROM   Right Shoulder Flexion 142 Degrees   Right Shoulder ABduction 173 Degrees                     [facility] Adult PT Treatment/Exercise - 10/21/15 0001    Shoulder Exercises: Supine   Horizontal ABduction Strengthening;Both;10 reps;Theraband   Theraband Level (Shoulder Horizontal ABduction) Level 3 (Green)   Other Supine Exercises supine scapular stabilization series, 10 each, with green Theraband   Shoulder Exercises: Standing   Flexion AAROM;Right;Other (comment)  7x at finger ladder, to #29   Shoulder Exercises: Pulleys   Flexion Limitations tried but she didn't feel a stretch   ABduction 3 minutes   Shoulder Exercises: Therapy  Ball   Flexion Limitations tried but she didn't get much stretch                PT Education - 10/21/15 0927    Education provided Yes   Education Details supine scapular strengthening series with green Theraband   Person(s) Educated Patient   Methods Explanation;Demonstration;Verbal cues;Handout   Comprehension Verbalized understanding;Returned demonstration           Short Term Clinic Goals - 09/30/15 1236    CC Short Term Goal  #1   Title Independent in a home exercise program for right shoulder ROM and in safe self-progression.   Status Partially Met           Breast Clinic Goals - 07/13/15 1339    Patient will be able to verbalize understanding of pertinent lymphedema risk reduction practices relevant to her diagnosis specifically related to skin care.   Time 1   Period Days   Status Achieved   Patient will be able to return demonstrate and/or verbalize understanding of the post-op home exercise program related to regaining shoulder range of motion.   Time 1   Period Days   Status Achieved   Patient will be able to verbalize understanding of the importance of attending the postoperative After Breast Cancer Class for further lymphedema risk reduction education and therapeutic exercise.   Time 1   Period Days   Status Achieved          Long Term Clinic Goals - 10/21/15 0908    CC Long Term Goal  #2   Title right shoulder active abduction to at least 140 degrees   Status Achieved   CC Long Term Goal  #3   Title Right shoulder pain on movement decreased at least 50%.   Status Achieved   CC Long Term Goal  #4   Title right shoulder active abduction to at least 150 degrees   Status Achieved   CC Long Term Goal  #5   Title right chest and axilla area perceived tightness reduced at least 60%   Status Achieved   CC Long Term Goal  #6   Title will no longer have visible and palpable cording on abduction of right shoulder   Status On-going             Plan - 10/21/15 1248    Clinical Impression Statement Patient has met many of her goals.  We focused more on exercise and less on manual therapy today.  She may be ready for discharge in the next week or two.   Rehab Potential Excellent   Clinical Impairments Affecting Rehab Potential Surgery still  recent (08/15/15); will start chemo 09/22/15.   PT Frequency 2x / week   PT Duration 4 weeks   PT Treatment/Interventions ADLs/Self Care Home Management;Therapeutic exercise;Patient/family education;Manual techniques;Manual lymph drainage;Scar mobilization;Passive range of motion   PT Next Visit Plan Review supine scapular series exercises; add to HEP other strengthening as appropriate.  Continue soft tissue mobilization at right chest area.  Consider discharge next week or the following week.   PT Home Exercise Plan right shoulder stretches, supine scapular series with green Theraband   Consulted and Agree with Plan of Care Patient      Patient will benefit from skilled therapeutic intervention in order to improve the following deficits and impairments:  Decreased knowledge of precautions, Decreased range of motion, Increased edema, Decreased scar mobility, Impaired UE functional use  Visit Diagnosis: Stiffness of right shoulder, not elsewhere classified  Aftercare following surgery for neoplasm  Pain in right shoulder     Problem List Patient Active Problem List   Diagnosis Date Noted  . Syncope 10/05/2015  . Genetic testing 08/21/2015  . Cancer of right breast ([place]) 08/15/2015  . Family history of breast cancer in female 07/15/2015  . Family history of colon cancer 07/15/2015  . Breast cancer of lower-outer quadrant of right female breast ([place]) 07/08/2015  . Disturbance in sleep behavior 11/18/2014  . Fibromyalgia 10/04/2014  . Flu-like symptoms 07/09/2014  . Diverticulitis of colon (without mention of hemorrhage) 12/01/2013  . UTI (urinary tract infection) 12/01/2013  .  Abdominal pain, unspecified site 12/01/2013  . Dry mouth 04/29/2013  . Dry eye 04/29/2013  . Dry skin 04/29/2013  . Obesity (BMI 30-39.9) 03/11/2013  . Sweating profusely 01/16/2013  . Grief 10/17/2012  . Edema 09/04/2012  . Depression 09/04/2012  . Acute bronchitis 05/30/2012  . Peripheral neuropathy ([place]) 05/12/2012  . OAB (overactive bladder) 04/01/2012  . Hypokalemia 02/06/2012  . DM w/o complication type II ([place]) 02/06/2012  . Colitis 01/21/2012  . Physical exam, annual 10/22/2011  . Allergy to dogs 04/19/2011  . Polyarthralgia 09/18/2010  . Diverticulitis 09/10/2010  . Obesity 07/25/2010  . DIVERTICULOSIS, COLON 06/02/2010  . STRESS INCONTINENCE 04/25/2010  . ASTHMA 04/11/2010  . VERTIGO 04/11/2010  . ACUTE SINUSITIS, UNSPECIFIED 04/04/2010  . Hypothyroidism 01/19/2010  . Hyperlipidemia associated with type 2 diabetes mellitus ([place]) 01/19/2010  . DEPRESSION 01/19/2010  . Essential hypertension 01/19/2010  . GERD 01/19/2010  . PEPTIC ULCER DISEASE 01/19/2010  . OSTEOARTHRITIS 01/19/2010  . LOW BACK PAIN 01/19/2010    [place],Tracey 10/21/2015, 12:52 PM  [place], [place], [address] Phone: [phone]   Fax:  [phone]  Name: Tracey Evans MRN: [id number] Date of Birth: [birth year]    Serafina Royals, PT 10/21/2015 12:52 PM

## 2015-10-23 ENCOUNTER — Encounter (HOSPITAL_COMMUNITY): Payer: Self-pay | Admitting: Emergency Medicine

## 2015-10-23 ENCOUNTER — Emergency Department (HOSPITAL_COMMUNITY)
Admission: EM | Admit: 2015-10-23 | Discharge: 2015-10-23 | Disposition: A | Discharge status: Home / Self Care | Payer: Medicare Other | Attending: Emergency Medicine | Admitting: Emergency Medicine

## 2015-10-23 DIAGNOSIS — E119 Type 2 diabetes mellitus without complications: Secondary | ICD-10-CM | POA: Diagnosis not present

## 2015-10-23 DIAGNOSIS — E785 Hyperlipidemia, unspecified: Secondary | ICD-10-CM | POA: Insufficient documentation

## 2015-10-23 DIAGNOSIS — F329 Major depressive disorder, single episode, unspecified: Secondary | ICD-10-CM | POA: Diagnosis not present

## 2015-10-23 DIAGNOSIS — Z853 Personal history of malignant neoplasm of breast: Secondary | ICD-10-CM | POA: Diagnosis not present

## 2015-10-23 DIAGNOSIS — M1990 Unspecified osteoarthritis, unspecified site: Secondary | ICD-10-CM | POA: Diagnosis not present

## 2015-10-23 DIAGNOSIS — E039 Hypothyroidism, unspecified: Secondary | ICD-10-CM | POA: Diagnosis not present

## 2015-10-23 DIAGNOSIS — Z7982 Long term (current) use of aspirin: Secondary | ICD-10-CM | POA: Diagnosis not present

## 2015-10-23 DIAGNOSIS — J45901 Unspecified asthma with (acute) exacerbation: Secondary | ICD-10-CM | POA: Diagnosis not present

## 2015-10-23 DIAGNOSIS — R0602 Shortness of breath: Secondary | ICD-10-CM | POA: Diagnosis present

## 2015-10-23 DIAGNOSIS — I10 Essential (primary) hypertension: Secondary | ICD-10-CM | POA: Diagnosis not present

## 2015-10-23 DIAGNOSIS — Z791 Long term (current) use of non-steroidal anti-inflammatories (NSAID): Secondary | ICD-10-CM | POA: Insufficient documentation

## 2015-10-23 DIAGNOSIS — Z79899 Other long term (current) drug therapy: Secondary | ICD-10-CM | POA: Insufficient documentation

## 2015-10-23 NOTE — Discharge Instructions (Signed)
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
## 2015-10-24 ENCOUNTER — Other Ambulatory Visit: Payer: Self-pay | Admitting: General Practice

## 2015-10-24 ENCOUNTER — Other Ambulatory Visit: Payer: Self-pay | Admitting: Family Medicine

## 2015-10-24 MED ORDER — ALPRAZOLAM 1 MG PO TABS: 1.0000 mg | ORAL_TABLET | Freq: Two times a day (BID) | ORAL | Status: DC | PRN

## 2015-10-24 NOTE — Telephone Encounter (Signed)
Medication filled to pharmacy as requested.   

## 2015-10-24 NOTE — Telephone Encounter (Signed)
Last OV 10/05/15 Alprazolam last filled 05/23/15 #60 with 3

## 2015-10-25 ENCOUNTER — Ambulatory Visit: Payer: Medicare Other

## 2015-10-25 ENCOUNTER — Other Ambulatory Visit: Payer: Medicare Other

## 2015-10-25 DIAGNOSIS — R293 Abnormal posture: Secondary | ICD-10-CM | POA: Diagnosis not present

## 2015-10-25 DIAGNOSIS — R222 Localized swelling, mass and lump, trunk: Secondary | ICD-10-CM | POA: Diagnosis not present

## 2015-10-25 DIAGNOSIS — Z483 Aftercare following surgery for neoplasm: Secondary | ICD-10-CM

## 2015-10-25 DIAGNOSIS — M25511 Pain in right shoulder: Secondary | ICD-10-CM | POA: Diagnosis not present

## 2015-10-25 DIAGNOSIS — M25611 Stiffness of right shoulder, not elsewhere classified: Secondary | ICD-10-CM | POA: Diagnosis not present

## 2015-10-25 NOTE — Therapy (Signed)
[place], [place], [address] Phone: [phone]   Fax:  [phone]  Physical Therapy Treatment  Patient Details  Name: Tracey Evans MRN: [id number] Date of Birth: April 01, 1946 Referring Provider: Dr. Brion Aliment  Encounter Date: 10/25/2015      PT End of Session - 10/25/15 0914    Visit Number 12   Number of Visits 18   Date for PT Re-Evaluation 11/13/15   PT Start Time 0846   PT Stop Time 0925   PT Time Calculation (min) 39 min   Activity Tolerance Patient tolerated treatment well   Behavior During Therapy [facility] for tasks assessed/performed      Past Medical History  Diagnosis Date  . GERD (gastroesophageal reflux disease)   . Hyperlipidemia   . Hypertension   . Depression   . Fibromyalgia   . Peptic ulcer disease   . Hypothyroidism   . Chronic lower back pain   . Asthma   . Diverticular disease   . Binge eating disorder   . Costochondritis   . Anxiety   . Hot flashes   . Shortness of breath dyspnea   . Complication of anesthesia     had bronc spasms during intubation surgery 2009 on foot-need albuterol inhaler or neb tx preop  . Angina at rest [facility])     "occurs whenever it wants to, but worse during agitation"  . Chronic bronchitis ([place])   . Type II diabetes mellitus ([place])     "diet controlled" (08/15/2015)  . Osteoarthritis     "all over"  . Breast cancer of lower-outer quadrant of left female breast ([place]) 07/08/2015    "NEVER HAD LEFT BREAST CANCER" (08/15/2015)  . Cancer of right breast ([place]) 08/15/2015    Past Surgical History  Procedure Laterality Date  . Tonsillectomy    . Shoulder arthroscopy Bilateral 2009-2011    "bone spurs"  . Colonoscopy    . Ankle fracture surgery Right 2009    Dr. Rolena Infante  . Ankle arthroscopy  01/02/2012    Procedure: ANKLE ARTHROSCOPY;  Surgeon: Colin Rhein, MD;  Location: [place];  Service: Orthopedics;  Laterality:  Right;  right ankle arthroscopy with extensive debridement , dridement and drilling talar dome osteochondral lesion  . Knee arthroscopy Bilateral   . Carpal tunnel release Bilateral   . Cardiac catheterization    . Mastectomy complete / simple w/ sentinel node biopsy Right 08/15/2015  . Laparoscopic cholecystectomy    . Fracture surgery    . Dilation and curettage of uterus      "before hysterectomy"  . Abdominal hysterectomy      "left my ovaries"  . Portacath placement Left 08/15/2015  . Breast biopsy  07/2015  . Mastectomy w/ sentinel node biopsy Right 08/15/2015    Procedure: TOTAL MASTECTOMY WITH SENTINEL LYMPH NODE BIOPSY AND BLUE DYE INJECTION ;  Surgeon: Fanny Skates, MD;  Location: [place];  Service: General;  Laterality: Right;  . Portacath placement Left 08/15/2015    Procedure: INSERTION PORT-A-CATH ;  Surgeon: Fanny Skates, MD;  Location: [place];  Service: General;  Laterality: Left;    There were no vitals filed for this visit.      Subjective Assessment - 10/25/15 0850    Subjective An asthma attack woke me up around 0300 this morning and then on my way here I was sitting at a red light and "my world tilited". It went away after about 15 seconds  but it sure did scare me!   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  Took Arimidex until day of surgery on May 8th, 2017 and portacath was placed at left chest along with right mastectomy.  Has had one drain removed and one is left.  Fibromyalgia diagnosed 20 years ago and says she manages it.  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  Has had arthroscopic debridement of both shoulders 2010.  Both knee arthroscopic surgery in the past; fracture of right ankle  about 2011 with ORIF, subsequently removed.  HTN controlled with meds.  Angina and usually remembers her nitroglycerin, but says it is controlled.   Patient Stated Goals check ROM and get arm back to normal   Currently in Pain? No/denies                         [facility] Adult PT Treatment/Exercise - 10/25/15 0001    Shoulder Exercises: Supine   Other Supine Exercises Reviewed Supine Scapular Series with Green Theraband 10 times each except only 5 times with Wide Grip due to pt reported feeling alot of painful pressure at shoulders with this today, VC required for correct technique.   Shoulder Exercises: Standing   Flexion Strengthening;Both;10 reps;Weights  to 90 degrees with back against wall   Shoulder Flexion Weight (lbs) 2   ABduction Strengthening;Both;10 reps;Weights  to 90 degrees, back against wall   Shoulder ABduction Weight (lbs) 2   Other Standing Exercises Bil Scaption to 90 degrees with 2 lbs, x 10 back against wall   Shoulder Exercises: Pulleys   ABduction 3 minutes   Shoulder Exercises: Stretch   Corner Stretch 5 reps;10 seconds  VC to have pt try varying positions for effective stretch                   Short Term Clinic Goals - 09/30/15 1236    CC Short Term Goal  #1   Title Independent in a home exercise program for right shoulder ROM and in safe self-progression.   Status Partially [place] [facility] Goals - 10/21/15 0908    CC Long Term Goal  #2   Title right shoulder active abduction to at least 140 degrees   Status Achieved   CC Long Term Goal  #3   Title Right shoulder pain on movement decreased at least 50%.   Status Achieved   CC Long Term Goal  #4   Title right shoulder active abduction to at least 150 degrees   Status Achieved   CC Long Term Goal  #5   Title right chest and axilla area perceived tightness reduced at least 60%   Status Achieved   CC Long Term Goal  #6   Title will no longer have visible and  palpable cording on abduction of right shoulder   Status On-going            Plan - 10/25/15 0914    Clinical Impression Statement Patient continues to do well with her ROM. She required some VCs for correct technique with review of Supine Scapular Series issued last visit. She will  benefit form coming for one more visit to reassess technique again and feels she will be ready for D/C next visit.    Rehab Potential Excellent   Clinical Impairments Affecting Rehab Potential Surgery still recent (08/15/15); will start chemo 09/22/15.   PT Frequency 2x / week   PT Duration 4 weeks   PT Treatment/Interventions ADLs/Self Care Home Management;Therapeutic exercise;Patient/family education;Manual techniques;Manual lymph drainage;Scar mobilization;Passive range of motion   PT Next Visit Plan Add 3 way strengthening UE exercises to HEP performed today, review supine scapular series prn and D/C next visit.    Consulted and Agree with Plan of Care Patient      Patient will benefit from skilled therapeutic intervention in order to improve the following deficits and impairments:  Decreased knowledge of precautions, Decreased range of motion, Increased edema, Decreased scar mobility, Impaired UE functional use  Visit Diagnosis: Stiffness of right shoulder, not elsewhere classified  Aftercare following surgery for neoplasm  Pain in right shoulder  Localized swelling, mass and lump, trunk  Abnormal posture     Problem List Patient Active Problem List   Diagnosis Date Noted  . Syncope 10/05/2015  . Genetic testing 08/21/2015  . Cancer of right breast ([place]) 08/15/2015  . Family history of breast cancer in female 07/15/2015  . Family history of colon cancer 07/15/2015  . Breast cancer of lower-outer quadrant of right female breast ([place]) 07/08/2015  . Disturbance in sleep behavior 11/18/2014  . Fibromyalgia 10/04/2014  . Flu-like symptoms 07/09/2014  . Diverticulitis of colon (without mention  of hemorrhage) 12/01/2013  . UTI (urinary tract infection) 12/01/2013  . Abdominal pain, unspecified site 12/01/2013  . Dry mouth 04/29/2013  . Dry eye 04/29/2013  . Dry skin 04/29/2013  . Obesity (BMI 30-39.9) 03/11/2013  . Sweating profusely 01/16/2013  . Grief 10/17/2012  . Edema 09/04/2012  . Depression 09/04/2012  . Acute bronchitis 05/30/2012  . Peripheral neuropathy ([place]) 05/12/2012  . OAB (overactive bladder) 04/01/2012  . Hypokalemia 02/06/2012  . DM w/o complication type II ([place]) 02/06/2012  . Colitis 01/21/2012  . Physical exam, annual 10/22/2011  . Allergy to dogs 04/19/2011  . Polyarthralgia 09/18/2010  . Diverticulitis 09/10/2010  . Obesity 07/25/2010  . DIVERTICULOSIS, COLON 06/02/2010  . STRESS INCONTINENCE 04/25/2010  . ASTHMA 04/11/2010  . VERTIGO 04/11/2010  . ACUTE SINUSITIS, UNSPECIFIED 04/04/2010  . Hypothyroidism 01/19/2010  . Hyperlipidemia associated with type 2 diabetes mellitus ([place]) 01/19/2010  . DEPRESSION 01/19/2010  . Essential hypertension 01/19/2010  . GERD 01/19/2010  . PEPTIC ULCER DISEASE 01/19/2010  . OSTEOARTHRITIS 01/19/2010  . LOW BACK PAIN 01/19/2010    Otelia Limes, PTA 10/25/2015, 9:27 AM  [place], [place], [address] Phone: [phone]   Fax:  [phone]  Name: Tracey Evans MRN: [id number] Date of Birth: May 12, 1945

## 2015-10-27 ENCOUNTER — Encounter: Payer: Self-pay | Admitting: *Deleted

## 2015-10-27 NOTE — Progress Notes (Signed)
[place] Work  Holiday representative met with patient at [facility] in [place] to offer support and assess for needs.  CSW and pt completed applications for [facility] in [place] and submitted via fax. CSW provided supportive counseling to pt and granddaughter. CSW provided tour to granddaughter of infusion and reading material. CSW to follow and assist.     Loren Racer, [place] Phone: [phone] Fax: [phone]

## 2015-10-28 ENCOUNTER — Ambulatory Visit: Payer: Medicare Other | Admitting: Physical Therapy

## 2015-10-28 ENCOUNTER — Encounter: Payer: Self-pay | Admitting: Physical Therapy

## 2015-10-28 DIAGNOSIS — Z483 Aftercare following surgery for neoplasm: Secondary | ICD-10-CM | POA: Diagnosis not present

## 2015-10-28 DIAGNOSIS — M25611 Stiffness of right shoulder, not elsewhere classified: Secondary | ICD-10-CM

## 2015-10-28 DIAGNOSIS — R222 Localized swelling, mass and lump, trunk: Secondary | ICD-10-CM | POA: Diagnosis not present

## 2015-10-28 DIAGNOSIS — R293 Abnormal posture: Secondary | ICD-10-CM

## 2015-10-28 DIAGNOSIS — M25511 Pain in right shoulder: Secondary | ICD-10-CM

## 2015-10-28 NOTE — Therapy (Signed)
[place], [place], [address] Phone: [phone]   Fax:  [phone]  Physical Therapy Treatment  Patient Details  Name: Tracey Evans MRN: [id number] Date of Birth: 1946/04/01 Referring Provider: Dr. Brion Aliment  Encounter Date: 10/28/2015      PT End of Session - 10/28/15 1111    Visit Number 13   Number of Visits 18   Date for PT Re-Evaluation 11/13/15   PT Start Time 0930   PT Stop Time 1016   PT Time Calculation (min) 46 min   Activity Tolerance Patient tolerated treatment well   Behavior During Therapy [facility] for tasks assessed/performed      Past Medical History  Diagnosis Date  . GERD (gastroesophageal reflux disease)   . Hyperlipidemia   . Hypertension   . Depression   . Fibromyalgia   . Peptic ulcer disease   . Hypothyroidism   . Chronic lower back pain   . Asthma   . Diverticular disease   . Binge eating disorder   . Costochondritis   . Anxiety   . Hot flashes   . Shortness of breath dyspnea   . Complication of anesthesia     had bronc spasms during intubation surgery 2009 on foot-need albuterol inhaler or neb tx preop  . Angina at rest [facility])     "occurs whenever it wants to, but worse during agitation"  . Chronic bronchitis ([place])   . Type II diabetes mellitus ([place])     "diet controlled" (08/15/2015)  . Osteoarthritis     "all over"  . Breast cancer of lower-outer quadrant of left female breast ([place]) 07/08/2015    "NEVER HAD LEFT BREAST CANCER" (08/15/2015)  . Cancer of right breast ([place]) 08/15/2015    Past Surgical History  Procedure Laterality Date  . Tonsillectomy    . Shoulder arthroscopy Bilateral 2009-2011    "bone spurs"  . Colonoscopy    . Ankle fracture surgery Right 2009    Dr. Rolena Infante  . Ankle arthroscopy  01/02/2012    Procedure: ANKLE ARTHROSCOPY;  Surgeon: Colin Rhein, MD;  Location: [place];  Service: Orthopedics;  Laterality:  Right;  right ankle arthroscopy with extensive debridement , dridement and drilling talar dome osteochondral lesion  . Knee arthroscopy Bilateral   . Carpal tunnel release Bilateral   . Cardiac catheterization    . Mastectomy complete / simple w/ sentinel node biopsy Right 08/15/2015  . Laparoscopic cholecystectomy    . Fracture surgery    . Dilation and curettage of uterus      "before hysterectomy"  . Abdominal hysterectomy      "left my ovaries"  . Portacath placement Left 08/15/2015  . Breast biopsy  07/2015  . Mastectomy w/ sentinel node biopsy Right 08/15/2015    Procedure: TOTAL MASTECTOMY WITH SENTINEL LYMPH NODE BIOPSY AND BLUE DYE INJECTION ;  Surgeon: Fanny Skates, MD;  Location: [place];  Service: General;  Laterality: Right;  . Portacath placement Left 08/15/2015    Procedure: INSERTION PORT-A-CATH ;  Surgeon: Fanny Skates, MD;  Location: [place];  Service: General;  Laterality: Left;    There were no vitals filed for this visit.      Subjective Assessment - 10/28/15 0936    Subjective Everything is going fine.    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  Took Arimidex until day of surgery on May 8th, 2017 and portacath was placed at left chest along with right mastectomy.  Has had one drain removed and one is left.  Fibromyalgia diagnosed 20 years ago and says she manages it.  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  Has had arthroscopic debridement of both shoulders 2010.  Both knee arthroscopic surgery in the past; fracture of right ankle about 2011 with ORIF, subsequently removed.  HTN controlled with meds.  Angina and usually remembers her nitroglycerin, but says it is controlled.   Patient Stated  Goals check ROM and get arm back to normal   Currently in Pain? No/denies   Pain Score 0-No pain                         [facility] Adult PT Treatment/Exercise - 10/28/15 0001    Shoulder Exercises: Supine   Other Supine Exercises supine scapular series with green theraband x 10 reps each   Shoulder Exercises: Standing   Flexion Strengthening;Both;10 reps;Weights  to 90 degrees with back against wall   Shoulder Flexion Weight (lbs) 2   ABduction Strengthening;Both;10 reps;Weights  to 90 degrees, back against wall   Shoulder ABduction Weight (lbs) 2   Other Standing Exercises Bil Scaption to 90 degrees with 2 lbs, x 10 back against wall  with cues to relax arms all the way down after each rep   Other Standing Exercises [place] exercises bilaterally with green theraband x 10 reps each   Shoulder Exercises: Pulleys   ABduction 3 minutes   Shoulder Exercises: Stretch   Corner Stretch 5 reps;10 seconds  VC to have pt try varying positions for effective stretch                   Short Term Clinic Goals - 10/28/15 1113    CC Short Term Goal  #1   Title Independent in a home exercise program for right shoulder ROM and in safe self-progression.   Status Achieved   CC Short Term Goal  #2   Title Right shoulder flexion to at least 115 for improved overhead reach.   Baseline 97 at eval; 131 prior to surgery, 134 degrees 09/12/15   Status Achieved   CC Short Term Goal  #3   Title Right shoulder abduction to at least 110 degrees for improved ADLs   Baseline 85 at eval, 150 prior to surgery, 131 degrees 09/12/15   Status Achieved           Breast Clinic Goals - 07/13/15 1339    Patient will be able to verbalize understanding of pertinent lymphedema risk reduction practices relevant to her diagnosis specifically related to skin care.   Time 1   Period Days   Status Achieved   Patient will be able to return demonstrate and/or verbalize understanding of the post-op  home exercise program related to regaining shoulder range of motion.   Time 1   Period Days   Status Achieved   Patient will be able to verbalize understanding of the importance of attending the postoperative After Breast Cancer Class for further lymphedema risk reduction education and therapeutic exercise.   Time 1   Period Days   Status Achieved  Long Term Clinic Goals - 11-04-15 1114    CC Long Term Goal  #1   Title Right shoulder active flexion to at least 125 degrees for improved overhead reach.   Baseline 97 at eval, 131 prior to surgery   Status Achieved   CC Long Term Goal  #2   Title right shoulder active abduction to at least 140 degrees   Baseline 85 at eval, 150 prior to surgery   Status Achieved   CC Long Term Goal  #3   Title Right shoulder pain on movement decreased at least 50%.   Status Achieved   CC Long Term Goal  #4   Title right shoulder active abduction to at least 150 degrees   Status Achieved   CC Long Term Goal  #5   Title right chest and axilla area perceived tightness reduced at least 60%   Status Achieved   CC Long Term Goal  #6   Title will no longer have visible and palpable cording on abduction of right shoulder   Baseline Pt only has slight cording in right axilla but it does not extend down to forearm and does not limit ROM   Status Achieved            Plan - 11-04-2015 1112    Clinical Impression Statement Pt feels she is now ready for discharge from therapy. She was given [place] exercises to add to her home program and was able to demonstrate correct technique. She also demonstrate good technique with supine scapular series. Pt has now met all goals for therapy. She has some cording in her right axilla but it does not extend down her arm nor does it inhibit her range of motion. Pt to be discharged from skilled PT services at this time.    Rehab Potential Excellent   Clinical Impairments Affecting Rehab Potential Surgery still  recent (08/15/15); will start chemo 09/22/15.   PT Frequency 2x / week   PT Duration 4 weeks   PT Treatment/Interventions ADLs/Self Care Home Management;Therapeutic exercise;Patient/family education;Manual techniques;Manual lymph drainage;Scar mobilization;Passive range of motion   PT Next Visit Plan dc today   PT Home Exercise Plan right shoulder stretches, supine scapular series with green Theraband, [place] exercises   Consulted and Agree with Plan of Care Patient      Patient will benefit from skilled therapeutic intervention in order to improve the following deficits and impairments:  Decreased knowledge of precautions, Decreased range of motion, Increased edema, Decreased scar mobility, Impaired UE functional use  Visit Diagnosis: Stiffness of right shoulder, not elsewhere classified  Pain in right shoulder  Abnormal posture       G-Codes - 11-04-2015 1114    Functional Assessment Tool Used clinical judgement   Functional Limitation Carrying, moving and handling objects   Carrying, Moving and Handling Objects Goal Status ([id number]) At least 1 percent but less than 20 percent impaired, limited or restricted   Carrying, Moving and Handling Objects Discharge Status [phone]) At least 1 percent but less than 20 percent impaired, limited or restricted      Problem List Patient Active Problem List   Diagnosis Date Noted  . Syncope 10/05/2015  . Genetic testing 08/21/2015  . Cancer of right breast ([place]) 08/15/2015  . Family history of breast cancer in female 07/15/2015  . Family history of colon cancer 07/15/2015  . Breast cancer of lower-outer quadrant of right female breast ([place]) 07/08/2015  . Disturbance in sleep behavior 11/18/2014  . Fibromyalgia 10/04/2014  .  Flu-like symptoms 07/09/2014  . Diverticulitis of colon (without mention of hemorrhage) 12/01/2013  . UTI (urinary tract infection) 12/01/2013  . Abdominal pain, unspecified site 12/01/2013  . Dry mouth 04/29/2013  .  Dry eye 04/29/2013  . Dry skin 04/29/2013  . Obesity (BMI 30-39.9) 03/11/2013  . Sweating profusely 01/16/2013  . Grief 10/17/2012  . Edema 09/04/2012  . Depression 09/04/2012  . Acute bronchitis 05/30/2012  . Peripheral neuropathy ([place]) 05/12/2012  . OAB (overactive bladder) 04/01/2012  . Hypokalemia 02/06/2012  . DM w/o complication type II ([place]) 02/06/2012  . Colitis 01/21/2012  . Physical exam, annual 10/22/2011  . Allergy to dogs 04/19/2011  . Polyarthralgia 09/18/2010  . Diverticulitis 09/10/2010  . Obesity 07/25/2010  . DIVERTICULOSIS, COLON 06/02/2010  . STRESS INCONTINENCE 04/25/2010  . ASTHMA 04/11/2010  . VERTIGO 04/11/2010  . ACUTE SINUSITIS, UNSPECIFIED 04/04/2010  . Hypothyroidism 01/19/2010  . Hyperlipidemia associated with type 2 diabetes mellitus ([place]) 01/19/2010  . DEPRESSION 01/19/2010  . Essential hypertension 01/19/2010  . GERD 01/19/2010  . PEPTIC ULCER DISEASE 01/19/2010  . OSTEOARTHRITIS 01/19/2010  . LOW BACK PAIN 01/19/2010    Alexia Freestone 10/28/2015, 11:16 AM  [place], [place], [address] Phone: [phone]   Fax:  [phone]  Name: Tracey Evans MRN: [id number] Date of Birth: [birth year]   PHYSICAL THERAPY DISCHARGE SUMMARY  Visits from Start of Care: 3 Current functional level related to goals / functional outcomes: See above  Remaining deficits: None   Education / Equipment: Home exercise program Plan: Patient agrees to discharge.  Patient goals were met. Patient is being discharged due to meeting the stated rehab goals.  ?????       Allyson Sabal, PT 10/28/2015 11:17 AM

## 2015-10-28 NOTE — Patient Instructions (Signed)
Strengthening: Resisted Internal Rotation   Hold tubing in right hand, elbow at side and forearm out. Rotate forearm in across body. Repeat on opposite side. Repeat _10___ times per set. Do ___1_ sets per session. Do __2_ sessions per day.  http://orth.exer.us/830   Copyright  VHI. All rights reserved.  Strengthening: Resisted External Rotation   Hold tubing in right hand, elbow at side and forearm across body. Rotate forearm out. Repeat on opposite side. Repeat _10___ times per set. Do _1___ sets per session. Do _2___ sessions per day.  http://orth.exer.us/828   Copyright  VHI. All rights reserved.  Strengthening: Resisted Flexion   Hold tubing with right arm at side. Pull forward and up. Move shoulder through pain-free range of motion. Repeat on opposite side.  Repeat _10___ times per set. Do _1___ sets per session. Do _2__ sessions per day.  http://orth.exer.us/824   Copyright  VHI. All rights reserved.  Strengthening: Resisted Extension   Hold tubing in right hand, arm forward. Pull arm back, elbow straight. Repeat on opposite side.  Repeat _10___ times per set. Do _1___ sets per session. Do __2__ sessions per day.  http://orth.exer.us/832   Copyright  VHI. All rights reserved.

## 2015-10-29 NOTE — ED Provider Notes (Signed)
CSN: [id number]     Arrival date & time 10/23/15  [id number] History   First MD Initiated Contact with Patient 10/23/15 [phone]     Chief Complaint  Patient presents with  . Asthma      HPI  Patient presents evaluation of her asthma exacerbation. She states that her apartment to light. They brought in vacuum was advanced she seemed to have a flareup of her asthma. Feel short of breath. Get a nebulizer treatment en route. Feels better.  Past Medical History  Diagnosis Date  . GERD (gastroesophageal reflux disease)   . Hyperlipidemia   . Hypertension   . Depression   . Fibromyalgia   . Peptic ulcer disease   . Hypothyroidism   . Chronic lower back pain   . Asthma   . Diverticular disease   . Binge eating disorder   . Costochondritis   . Anxiety   . Hot flashes   . Shortness of breath dyspnea   . Complication of anesthesia     had bronc spasms during intubation surgery 2009 on foot-need albuterol inhaler or neb tx preop  . Angina at rest [facility])     "occurs whenever it wants to, but worse during agitation"  . Chronic bronchitis ([place])   . Type II diabetes mellitus ([place])     "diet controlled" (08/15/2015)  . Osteoarthritis     "all over"  . Breast cancer of lower-outer quadrant of left female breast ([place]) 07/08/2015    "NEVER HAD LEFT BREAST CANCER" (08/15/2015)  . Cancer of right breast ([place]) 08/15/2015   Past Surgical History  Procedure Laterality Date  . Tonsillectomy    . Shoulder arthroscopy Bilateral 2009-2011    "bone spurs"  . Colonoscopy    . Ankle fracture surgery Right 2009    Dr. Rolena Infante  . Ankle arthroscopy  01/02/2012    Procedure: ANKLE ARTHROSCOPY;  Surgeon: Colin Rhein, MD;  Location: [place];  Service: Orthopedics;  Laterality: Right;  right ankle arthroscopy with extensive debridement , dridement and drilling talar dome osteochondral lesion  . Knee arthroscopy Bilateral   . Carpal tunnel release Bilateral   . Cardiac catheterization    .  Mastectomy complete / simple w/ sentinel node biopsy Right 08/15/2015  . Laparoscopic cholecystectomy    . Fracture surgery    . Dilation and curettage of uterus      "before hysterectomy"  . Abdominal hysterectomy      "left my ovaries"  . Portacath placement Left 08/15/2015  . Breast biopsy  07/2015  . Mastectomy w/ sentinel node biopsy Right 08/15/2015    Procedure: TOTAL MASTECTOMY WITH SENTINEL LYMPH NODE BIOPSY AND BLUE DYE INJECTION ;  Surgeon: Fanny Skates, MD;  Location: [facility];  Service: General;  Laterality: Right;  . Portacath placement Left 08/15/2015    Procedure: INSERTION PORT-A-CATH ;  Surgeon: Fanny Skates, MD;  Location[facility];  Service: General;  Laterality: Left;   Family History  Problem Relation Age of Onset  . Dementia Mother   . Stroke Mother   . Heart Problems Mother   . Dementia Father   . Alcohol abuse Father   . Colon cancer Father     dx. 64 or younger  . Breast cancer Sister 58    inflammatory  . Diverticulitis Sister     maternal half-sister; severe - causing partial colectomy  . Breast cancer Sister     maternal half-sister; dx. early 62s  . Breast cancer Other  70    niece  . Breast cancer Other     niece dx. 73s  . Alcohol abuse Brother    Social History  Substance Use Topics  . Smoking status: Never Smoker   . Smokeless tobacco: Never Used  . Alcohol Use: 0.0 oz/week    0 Standard drinks or equivalent per week     Comment: 08/15/2015 "1/2 glass of wine q 2 months"    OB History    No data available     Review of Systems  Constitutional: Negative for fever, chills, diaphoresis, appetite change and fatigue.  HENT: Negative for mouth sores, sore throat and trouble swallowing.   Eyes: Negative for visual disturbance.  Respiratory: Positive for shortness of breath. Negative for cough, chest tightness and wheezing.   Cardiovascular: Negative for chest pain.  Gastrointestinal: Negative for nausea, vomiting, abdominal pain, diarrhea and  abdominal distention.  Endocrine: Negative for polydipsia, polyphagia and polyuria.  Genitourinary: Negative for dysuria, frequency and hematuria.  Musculoskeletal: Negative for gait problem.  Skin: Negative for color change, pallor and rash.  Neurological: Negative for dizziness, syncope, light-headedness and headaches.  Hematological: Does not bruise/bleed easily.  Psychiatric/Behavioral: Negative for behavioral problems and confusion.      Allergies  Contrast media; Dilaudid; Adhesive; Codeine; and Lactose intolerance (gi)  Home Medications   Prior to Admission medications   Medication Sig Start Date End Date Taking? Authorizing Provider  albuterol (PROAIR HFA) 108 (90 BASE) MCG/ACT inhaler Inhale 2 puffs into the lungs every 6 (six) hours as needed for wheezing. 08/30/14   Midge Minium, MD  ALPRAZolam Duanne Moron) 1 MG tablet Take 1 tablet (1 mg total) by mouth 2 (two) times daily as needed. for anxiety 10/24/15   Midge Minium, MD  aspirin 81 MG tablet Take 81 mg by mouth daily.    Historical Provider, MD  atorvastatin (LIPITOR) 20 MG tablet TAKE 1 TABLET BY MOUTH EVERY DAY 03/07/15   Midge Minium, MD  buPROPion (WELLBUTRIN XL) 300 MG 24 hr tablet TAKE 1 TABLET BY MOUTH EVERY DAY 10/24/15   Midge Minium, MD  dexamethasone (DECADRON) 4 MG tablet Take 1 tablet (4 mg total) by mouth 2 (two) times daily. Start the day before Taxotere. Then again the day after chemo for 1 day. 08/24/15   Nicholas Lose, MD  levothyroxine (SYNTHROID, LEVOTHROID) 75 MCG tablet Take 1 tablet (75 mcg total) by mouth daily. 09/07/15   Midge Minium, MD  lidocaine-prilocaine (EMLA) cream Apply to affected area once 08/24/15   Nicholas Lose, MD  LORazepam (ATIVAN) 0.5 MG tablet Take 1 tablet (0.5 mg total) by mouth at bedtime. Patient taking differently: Take 0.5 mg by mouth at bedtime as needed for sleep.  08/24/15   Nicholas Lose, MD  magic mouthwash w/lidocaine SOLN 66ml Swish,Swallow, or spit 4  times a day as needed 09/30/15   Nicholas Lose, MD  meloxicam (MOBIC) 15 MG tablet Take 15 mg by mouth daily.  06/04/15   Historical Provider, MD  nitroGLYCERIN (NITROSTAT) 0.4 MG SL tablet Place 1 tablet (0.4 mg total) under the tongue every 5 (five) minutes as needed. As needed for chest pain 11/18/14   Midge Minium, MD  ondansetron (ZOFRAN) 8 MG tablet Take 1 tablet (8 mg total) by mouth 2 (two) times daily as needed for refractory nausea / vomiting. Start on day 3 after chemo. Patient taking differently: Take 8 mg by mouth 2 (two) times daily as needed for refractory nausea /  vomiting. Start on day 3 after chemo. Also takes 1 tablet in the morning 08/24/15   Nicholas Lose, MD  Polyethyl Glycol-Propyl Glycol (SYSTANE ULTRA) 0.4-0.3 % SOLN Place 1 drop into both eyes 3 (three) times daily as needed (for dry eyes).     Historical Provider, MD  prochlorperazine (COMPAZINE) 10 MG tablet Take 1 tablet (10 mg total) by mouth every 6 (six) hours as needed (Nausea or vomiting). Patient taking differently: Take 10 mg by mouth at bedtime as needed for nausea or vomiting.  08/24/15   Nicholas Lose, MD  ranitidine (ZANTAC) 150 MG tablet Take 150 mg by mouth 2 (two) times daily as needed for heartburn. Reported on 10/05/2015    Historical Provider, MD  tiZANidine (ZANAFLEX) 4 MG tablet Take 4 mg by mouth every 6 (six) hours as needed for muscle spasms. Reported on 10/05/2015    Historical Provider, MD  traMADol (ULTRAM) 50 MG tablet Take 50 mg by mouth daily as needed for moderate pain. Reported on 10/05/2015 04/08/15   Historical Provider, MD   BP 130/82 mmHg  Pulse 88  Temp(Src) 98.3 F (36.8 C) (Oral)  Resp 16  SpO2 96% Physical Exam  Constitutional: She is oriented to person, place, and time. She appears well-developed and well-nourished. No distress.  HENT:  Head: Normocephalic.  Eyes: Conjunctivae are normal. Pupils are equal, round, and reactive to light. No scleral icterus.  Neck: Normal range of  motion. Neck supple. No thyromegaly present.  Cardiovascular: Normal rate and regular rhythm.  Exam reveals no gallop and no friction rub.   No murmur heard. Pulmonary/Chest: Effort normal and breath sounds normal. No respiratory distress. She has no wheezes. She has no rales.  Abdominal: Soft. Bowel sounds are normal. She exhibits no distension. There is no tenderness. There is no rebound.  Musculoskeletal: Normal range of motion.  Neurological: She is alert and oriented to person, place, and time.  Skin: Skin is warm and dry. No rash noted.  Psychiatric: She has a normal mood and affect. Her behavior is normal.    ED Course  Procedures (including critical care time) Labs Review Labs Reviewed - No data to display  Imaging Review No results found. I have personally reviewed and evaluated these images and lab results as part of my medical decision-making.   EKG Interpretation None      MDM   Final diagnoses:  Asthma exacerbation    Patient discharged. Clear lungs and evaluation here. Asked to use the inhaler when necessary. Advised to avoid her apartment.    Tanna Furry, MD 10/29/15 0030

## 2015-10-31 ENCOUNTER — Ambulatory Visit: Payer: Medicare Other

## 2015-11-03 ENCOUNTER — Ambulatory Visit (HOSPITAL_BASED_OUTPATIENT_CLINIC_OR_DEPARTMENT_OTHER): Payer: Medicare Other

## 2015-11-03 ENCOUNTER — Other Ambulatory Visit: Payer: Self-pay | Admitting: Hematology and Oncology

## 2015-11-03 ENCOUNTER — Other Ambulatory Visit (HOSPITAL_BASED_OUTPATIENT_CLINIC_OR_DEPARTMENT_OTHER): Payer: Medicare Other

## 2015-11-03 ENCOUNTER — Encounter: Payer: Self-pay | Admitting: Hematology and Oncology

## 2015-11-03 ENCOUNTER — Other Ambulatory Visit: Payer: Self-pay | Admitting: *Deleted

## 2015-11-03 ENCOUNTER — Ambulatory Visit (HOSPITAL_BASED_OUTPATIENT_CLINIC_OR_DEPARTMENT_OTHER): Payer: Medicare Other | Admitting: Hematology and Oncology

## 2015-11-03 DIAGNOSIS — Z5112 Encounter for antineoplastic immunotherapy: Secondary | ICD-10-CM

## 2015-11-03 DIAGNOSIS — Z5189 Encounter for other specified aftercare: Secondary | ICD-10-CM | POA: Diagnosis not present

## 2015-11-03 DIAGNOSIS — Z170 Estrogen receptor positive status [ER+]: Secondary | ICD-10-CM | POA: Diagnosis not present

## 2015-11-03 DIAGNOSIS — C50511 Malignant neoplasm of lower-outer quadrant of right female breast: Secondary | ICD-10-CM

## 2015-11-03 DIAGNOSIS — Z5111 Encounter for antineoplastic chemotherapy: Secondary | ICD-10-CM

## 2015-11-03 LAB — COMPREHENSIVE METABOLIC PANEL
ALBUMIN: 3.4 g/dL — AB (ref 3.5–5.0)
ALK PHOS: 88 U/L (ref 40–150)
ALT: 18 U/L (ref 0–55)
ANION GAP: 9 meq/L (ref 3–11)
AST: 20 U/L (ref 5–34)
BILIRUBIN TOTAL: 0.31 mg/dL (ref 0.20–1.20)
BUN: 11.3 mg/dL (ref 7.0–26.0)
CALCIUM: 8.9 mg/dL (ref 8.4–10.4)
CO2: 27 meq/L (ref 22–29)
CREATININE: 1 mg/dL (ref 0.6–1.1)
Chloride: 108 mEq/L (ref 98–109)
EGFR: 63 mL/min/{1.73_m2} — AB (ref >90)
Glucose: 120 mg/dl (ref 70–140)
Potassium: 4 mEq/L (ref 3.5–5.1)
Sodium: 144 mEq/L (ref 136–145)
TOTAL PROTEIN: 6.1 g/dL — AB (ref 6.4–8.3)

## 2015-11-03 LAB — CBC WITH DIFFERENTIAL/PLATELET
BASO%: 0.6 % (ref 0.0–2.0)
Basophils Absolute: 0.1 10*3/uL (ref 0.0–0.1)
EOS ABS: 0.8 10*3/uL — AB (ref 0.0–0.5)
EOS%: 9.8 % — ABNORMAL HIGH (ref 0.0–7.0)
HEMATOCRIT: 32.1 % — AB (ref 34.8–46.6)
HEMOGLOBIN: 10.6 g/dL — AB (ref 11.6–15.9)
LYMPH#: 2.5 10*3/uL (ref 0.9–3.3)
LYMPH%: 30.6 % (ref 14.0–49.7)
MCH: 30.2 pg (ref 25.1–34.0)
MCHC: 33 g/dL (ref 31.5–36.0)
MCV: 91.5 fL (ref 79.5–101.0)
MONO#: 0.7 10*3/uL (ref 0.1–0.9)
MONO%: 9.1 % (ref 0.0–14.0)
NEUT%: 49.9 % (ref 38.4–76.8)
NEUTROS ABS: 4 10*3/uL (ref 1.5–6.5)
PLATELETS: 275 10*3/uL (ref 145–400)
RBC: 3.51 10*6/uL — AB (ref 3.70–5.45)
RDW: 16.3 % — ABNORMAL HIGH (ref 11.2–14.5)
WBC: 8 10*3/uL (ref 3.9–10.3)

## 2015-11-03 MED ORDER — PEGFILGRASTIM 6 MG/0.6ML ~~LOC~~ PSKT
6.0000 mg | PREFILLED_SYRINGE | Freq: Once | SUBCUTANEOUS | Status: AC
  Administered 2015-11-03: 6 mg via SUBCUTANEOUS
  Filled 2015-11-03: qty 0.6

## 2015-11-03 MED ORDER — SODIUM CHLORIDE 0.9 % IV SOLN
Freq: Once | INTRAVENOUS | Status: AC
  Administered 2015-11-03: 13:00:00 via INTRAVENOUS
  Filled 2015-11-03: qty 5

## 2015-11-03 MED ORDER — SODIUM CHLORIDE 0.9 % IV SOLN
Freq: Once | INTRAVENOUS | Status: AC
  Administered 2015-11-03: 12:00:00 via INTRAVENOUS

## 2015-11-03 MED ORDER — SODIUM CHLORIDE 0.9% FLUSH
10.0000 mL | INTRAVENOUS | Status: DC | PRN
  Filled 2015-11-03: qty 10

## 2015-11-03 MED ORDER — PALONOSETRON HCL INJECTION 0.25 MG/5ML
0.2500 mg | Freq: Once | INTRAVENOUS | Status: AC
  Administered 2015-11-03: 0.25 mg via INTRAVENOUS

## 2015-11-03 MED ORDER — TRASTUZUMAB CHEMO 150 MG IV SOLR
6.0000 mg/kg | Freq: Once | INTRAVENOUS | Status: AC
  Administered 2015-11-03: 546 mg via INTRAVENOUS
  Filled 2015-11-03: qty 26

## 2015-11-03 MED ORDER — HEPARIN SOD (PORK) LOCK FLUSH 100 UNIT/ML IV SOLN
500.0000 [IU] | Freq: Once | INTRAVENOUS | Status: DC | PRN
  Filled 2015-11-03: qty 5

## 2015-11-03 MED ORDER — DIPHENHYDRAMINE HCL 25 MG PO CAPS
50.0000 mg | ORAL_CAPSULE | Freq: Once | ORAL | Status: AC
  Administered 2015-11-03: 50 mg via ORAL

## 2015-11-03 MED ORDER — ACETAMINOPHEN 325 MG PO TABS
ORAL_TABLET | ORAL | Status: AC
  Filled 2015-11-03: qty 2

## 2015-11-03 MED ORDER — PALONOSETRON HCL INJECTION 0.25 MG/5ML
INTRAVENOUS | Status: AC
  Filled 2015-11-03: qty 5

## 2015-11-03 MED ORDER — SODIUM CHLORIDE 0.9 % IV SOLN
513.0000 mg | Freq: Once | INTRAVENOUS | Status: AC
  Administered 2015-11-03: 510 mg via INTRAVENOUS
  Filled 2015-11-03: qty 51

## 2015-11-03 MED ORDER — SODIUM CHLORIDE 0.9 % IV SOLN
65.0000 mg/m2 | Freq: Once | INTRAVENOUS | Status: AC
  Administered 2015-11-03: 140 mg via INTRAVENOUS
  Filled 2015-11-03: qty 14

## 2015-11-03 MED ORDER — ACETAMINOPHEN 325 MG PO TABS
650.0000 mg | ORAL_TABLET | Freq: Once | ORAL | Status: AC
Start: 2015-11-03 — End: 2015-11-03
  Administered 2015-11-03: 650 mg via ORAL

## 2015-11-03 MED ORDER — DIPHENHYDRAMINE HCL 25 MG PO CAPS
ORAL_CAPSULE | ORAL | Status: AC
  Filled 2015-11-03: qty 2

## 2015-11-03 NOTE — Patient Instructions (Signed)
[facility] Discharge Instructions for Patients Receiving Chemotherapy  Today you received the following chemotherapy agents: Herceptin, Taxotere, Carboplatin.  To help prevent nausea and vomiting after your treatment, we encourage you to take your nausea medication: Compazine 10 mg every 6 hours as needed; Zofran 8 mg every 12 hours as needed.   If you develop nausea and vomiting that is not controlled by your nausea medication, call the clinic.   BELOW ARE SYMPTOMS THAT SHOULD BE REPORTED IMMEDIATELY:  *FEVER GREATER THAN 100.5 F  *CHILLS WITH OR WITHOUT FEVER  NAUSEA AND VOMITING THAT IS NOT CONTROLLED WITH YOUR NAUSEA MEDICATION  *UNUSUAL SHORTNESS OF BREATH  *UNUSUAL BRUISING OR BLEEDING  TENDERNESS IN MOUTH AND THROAT WITH OR WITHOUT PRESENCE OF ULCERS  *URINARY PROBLEMS  *BOWEL PROBLEMS  UNUSUAL RASH Items with * indicate a potential emergency and should be followed up as soon as possible.  Feel free to call the clinic you have any questions or concerns. The clinic phone number is [phone].  Please show the [place] at check-in to the Emergency Department and triage nurse.

## 2015-11-03 NOTE — Assessment & Plan Note (Signed)
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 Adjuvant chemotherapy with [place] 2. Followed by adjuvant antiestrogen therapy with anastrozole 5 years No role of radiation since she had mastectomy. ----------------------------------------------------------------------------------------------------------------------- Current treatment: Cycle 3 day 1 TCH Echocardiogram was reviewed 08/25/2015: EF 65-70%  Chemotherapy toxicities: 1. Syncope: Patient went to the emergency room and was discharged home, no further problems since then 2. Diarrhea due to chemotherapy: Encouraged her to take Imodium and given written instructions on Imodium. She did not have diarrhea with cycle 2. 3. Nausea and vomiting due to chemotherapy: In spite of adding Emend, patient had persistent nausea and vomiting since the last 24 hours. We will administer IV Zofran along with IV fluids today. I plan to decrease the dosage of Cycle of chemotherapy. 4. Decreased appetite 5. Insomnia  Return to clinic with cycle 4.

## 2015-11-03 NOTE — Progress Notes (Signed)
Patient Care Team: Midge Minium, MD as PCP - General [name], MD as Consulting Physician (Cardiology) Marylynn Pearson, MD as Consulting Physician (Ophthalmology) Nicholas Lose, MD as Consulting Physician (Hematology and Oncology) Kyung Rudd, MD as Consulting Physician (Radiation Oncology) Fanny Skates, MD as Consulting Physician (General Surgery)  DIAGNOSIS: Breast cancer of lower-outer quadrant of right female breast [facility])   Staging form: Breast, AJCC 7th Edition   - Clinical stage from 07/13/2015: Stage IA (T1c, N0, M0) - Unsigned  SUMMARY OF ONCOLOGIC HISTORY:   Breast cancer of lower-outer quadrant of right female breast ([place])   07/06/2015 Initial Diagnosis    Right breast biopsy posterior: IDC ER 90%, PR 5%, Ki-67 60%, HER-2 positive ratio 1.42,copy #6.1 T1c N0 stage IA; Right breast biopsy inferior medial: High-grade DCIS with comedonecrosis; ER 100%, PR 90%; 5 mm calcs     07/27/2015 Procedure    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 Today's cycle 3. She had profound nausea and vomiting after cycle 2. This is in spite of giving her intermittent. Over the past 2 weeks she has had no further problems with nausea vomiting. She has excellent energy levels and is eating well. She reports to me that she eats a lot of chicken liver.  REVIEW OF SYSTEMS:   Constitutional: Denies  fevers, chills or abnormal weight loss Eyes: Denies blurriness of vision Ears, nose, mouth, throat, and face: Denies mucositis or sore throat Respiratory: Denies cough, dyspnea or wheezes Cardiovascular: Denies palpitation, chest discomfort Gastrointestinal:  Denies nausea, heartburn or change in bowel habits Skin: Denies abnormal skin rashes Lymphatics: Denies new lymphadenopathy or easy bruising Neurological:Denies numbness, tingling or new weaknesses Behavioral/Psych: Mood is stable, no new changes  Extremities: No lower extremity edema Breast:  denies any pain or lumps or nodules in either breasts All other systems were reviewed with the patient and are negative.  I have reviewed the past medical history, past surgical history, social history and family history with the patient and they are unchanged from previous note.  ALLERGIES:  is allergic to contrast media [iodinated diagnostic agents]; dilaudid [hydromorphone hcl]; adhesive [tape]; codeine; and lactose intolerance (gi).  MEDICATIONS:  Current Outpatient Prescriptions  Medication Sig Dispense Refill  . albuterol (PROAIR HFA) 108 (90 BASE) MCG/ACT inhaler Inhale 2 puffs into the lungs every 6 (six) hours as needed for wheezing. 1 Inhaler 3  . ALPRAZolam (XANAX) 1 MG tablet Take 1 tablet (1 mg total) by mouth 2 (two) times daily as needed. for anxiety 60 tablet 3  . aspirin 81 MG tablet Take 81 mg by mouth daily.    Marland Kitchen atorvastatin (LIPITOR) 20 MG tablet TAKE 1 TABLET BY MOUTH EVERY DAY 30 tablet 6  . buPROPion (WELLBUTRIN XL) 300 MG 24 hr tablet TAKE 1 TABLET BY MOUTH EVERY DAY 30 tablet 3  . dexamethasone (DECADRON) 4 MG tablet Take 1 tablet (4  mg total) by mouth 2 (two) times daily. Start the day before Taxotere. Then again the day after chemo for 1 day. 30 tablet 0  . levothyroxine (SYNTHROID, LEVOTHROID) 75 MCG tablet Take 1 tablet (75 mcg total) by mouth daily. 30 tablet 6  . lidocaine-prilocaine (EMLA) cream Apply to  affected area once 30 g 3  . LORazepam (ATIVAN) 0.5 MG tablet Take 1 tablet (0.5 mg total) by mouth at bedtime. (Patient taking differently: Take 0.5 mg by mouth at bedtime as needed for sleep. ) 30 tablet 0  . magic mouthwash w/lidocaine SOLN 47m Swish,Swallow, or spit 4 times a day as needed 240 mL 1  . meloxicam (MOBIC) 15 MG tablet Take 15 mg by mouth daily.     . nitroGLYCERIN (NITROSTAT) 0.4 MG SL tablet Place 1 tablet (0.4 mg total) under the tongue every 5 (five) minutes as needed. As needed for chest pain 30 tablet 3  . ondansetron (ZOFRAN) 8 MG tablet Take 1 tablet (8 mg total) by mouth 2 (two) times daily as needed for refractory nausea / vomiting. Start on day 3 after chemo. (Patient taking differently: Take 8 mg by mouth 2 (two) times daily as needed for refractory nausea / vomiting. Start on day 3 after chemo. Also takes 1 tablet in the morning) 30 tablet 1  . Polyethyl Glycol-Propyl Glycol (SYSTANE ULTRA) 0.4-0.3 % SOLN Place 1 drop into both eyes 3 (three) times daily as needed (for dry eyes).     . prochlorperazine (COMPAZINE) 10 MG tablet Take 1 tablet (10 mg total) by mouth every 6 (six) hours as needed (Nausea or vomiting). (Patient taking differently: Take 10 mg by mouth at bedtime as needed for nausea or vomiting. ) 30 tablet 1  . ranitidine (ZANTAC) 150 MG tablet Take 150 mg by mouth 2 (two) times daily as needed for heartburn. Reported on 10/05/2015    . tiZANidine (ZANAFLEX) 4 MG tablet Take 4 mg by mouth every 6 (six) hours as needed for muscle spasms. Reported on 10/05/2015    . traMADol (ULTRAM) 50 MG tablet Take 50 mg by mouth daily as needed for moderate pain. Reported on 10/05/2015  0   No current facility-administered medications for this visit.     PHYSICAL EXAMINATION: ECOG PERFORMANCE STATUS: 1 - Symptomatic but completely ambulatory  Vitals:   11/03/15 1002  BP: (!) 141/89  Pulse: 81  Resp: 18  Temp: 97.7 F (36.5 C)   Filed Weights   11/03/15 1002    Weight: 193 lb 6.4 oz (87.7 kg)    GENERAL:alert, no distress and comfortable SKIN: skin color, texture, turgor are normal, no rashes or significant lesions EYES: normal, Conjunctiva are pink and non-injected, sclera clear OROPHARYNX:no exudate, no erythema and lips, buccal mucosa, and tongue normal  NECK: supple, thyroid normal size, non-tender, without nodularity LYMPH:  no palpable lymphadenopathy in the cervical, axillary or inguinal LUNGS: clear to auscultation and percussion with normal breathing effort HEART: regular rate & rhythm and no murmurs and no lower extremity edema ABDOMEN:abdomen soft, non-tender and normal bowel sounds MUSCULOSKELETAL:no cyanosis of digits and no clubbing  NEURO: alert & oriented x 3 with fluent speech, no focal motor/sensory deficits EXTREMITIES: No lower extremity edema  LABORATORY DATA:  I have reviewed the data as listed   Chemistry      Component Value Date/Time   NA 144 11/03/2015 0953   K 4.0 11/03/2015 0953   CL 107 09/27/2015 0657   CO2 27 11/03/2015 0953  BUN 11.3 11/03/2015 0953   CREATININE 1.0 11/03/2015 0953      Component Value Date/Time   CALCIUM 8.9 11/03/2015 0953   ALKPHOS 88 11/03/2015 0953   AST 20 11/03/2015 0953   ALT 18 11/03/2015 0953   BILITOT 0.31 11/03/2015 0953       Lab Results  Component Value Date   WBC 8.0 11/03/2015   HGB 10.6 (L) 11/03/2015   HCT 32.1 (L) 11/03/2015   MCV 91.5 11/03/2015   PLT 275 11/03/2015   NEUTROABS 4.0 11/03/2015     ASSESSMENT & PLAN:  Breast cancer of lower-outer quadrant of right female breast ([place]) Right mastectomy 08/15/2015: IDC grade 2, 2.2 cm, with associated DCIS intermediate grade, separate focus high-grade DCIS, 0/4 lymph nodes negative, T2 N0 stage II a, ER 90%, PR 5%, HER-2 negative duration 1.42, Ki-67 60%  Treatment plan: 1. Adjuvant chemotherapy with [place] 2. Followed by adjuvant antiestrogen therapy with anastrozole 5 years No role of radiation since  she had mastectomy. ----------------------------------------------------------------------------------------------------------------------- Current treatment: Cycle 3 day 1 TCH Echocardiogram was reviewed 08/25/2015: EF 65-70%  Chemotherapy toxicities: 1. Syncope: Patient went to the emergency room and was discharged home, no further problems since then 2. Diarrhea due to chemotherapy: Encouraged her to take Imodium and given written instructions on Imodium. She did not have diarrhea with cycle 2. 3. Nausea and vomiting due to chemotherapy: In spite of adding Emend, patient had persistent nausea and vomiting that lasted 24 hours. We reduced the dosage of Cycle 3 chemotherapy. 4. Decreased appetite 5. Insomnia 6. Alopecia  Return to clinic with cycle 4.   No orders of the defined types were placed in this encounter.  The patient has a good understanding of the overall plan. she agrees with it. she will call with any problems that may develop before the next visit here.   Rulon Eisenmenger, MD 11/03/15

## 2015-11-04 ENCOUNTER — Telehealth: Payer: Self-pay | Admitting: Hematology and Oncology

## 2015-11-04 NOTE — Telephone Encounter (Signed)
Spoke with pt to confirm 8/17 appts per pof

## 2015-11-07 ENCOUNTER — Encounter: Payer: Medicare Other | Admitting: Physical Therapy

## 2015-11-11 ENCOUNTER — Encounter: Payer: Medicare Other | Admitting: Physical Therapy

## 2015-11-14 ENCOUNTER — Encounter: Payer: Self-pay | Admitting: Family Medicine

## 2015-11-14 ENCOUNTER — Encounter: Payer: Medicare Other | Admitting: Physical Therapy

## 2015-11-14 ENCOUNTER — Ambulatory Visit (INDEPENDENT_AMBULATORY_CARE_PROVIDER_SITE_OTHER): Payer: Medicare Other | Admitting: Family Medicine

## 2015-11-14 ENCOUNTER — Ambulatory Visit: Payer: Medicare Other | Admitting: Physical Therapy

## 2015-11-14 VITALS — BP 112/76 | HR 100 | Temp 98.0°F | Resp 17 | Ht 66.0 in | Wt 185.2 lb

## 2015-11-14 DIAGNOSIS — E119 Type 2 diabetes mellitus without complications: Secondary | ICD-10-CM | POA: Diagnosis not present

## 2015-11-14 DIAGNOSIS — Z79899 Other long term (current) drug therapy: Secondary | ICD-10-CM | POA: Diagnosis not present

## 2015-11-14 LAB — MICROALBUMIN / CREATININE URINE RATIO
Creatinine,U: 315.1 mg/dL
MICROALB UR: 4.7 mg/dL — AB (ref 0.0–1.9)
Microalb Creat Ratio: 1.5 mg/g (ref 0.0–30.0)

## 2015-11-14 LAB — HEMOGLOBIN A1C: Hgb A1c MFr Bld: 6.7 % — ABNORMAL HIGH (ref 4.6–6.5)

## 2015-11-14 NOTE — Patient Instructions (Signed)
Follow up in 3-4 months to recheck diabetes We'll notify you of your lab results and make any changes if needed Keep up the good work!  You look great!!! Call with any questions or concerns Hang in there!!!

## 2015-11-14 NOTE — Progress Notes (Signed)
Pre visit review using our clinic review tool, if applicable. No additional management support is needed unless otherwise documented below in the visit note. 

## 2015-11-14 NOTE — Progress Notes (Signed)
   Subjective:    Patient ID: Tracey Evans, female    DOB: 10/27/1945, 70 y.o.   MRN: [id number]  HPI DM- chronic problem, currently diet controlled.  UTD on foot exam/eye exam.  Has eye exam next week.  No longer on ARB due to low BP- will need Microalbumin.  Pt has lost 8 lbs since starting chemo.  Denies CP, SOB, HAs, visual changes, edema.     Review of Systems For ROS see HPI     Objective:   Physical Exam  Constitutional: She is oriented to person, place, and time. She appears well-developed and well-nourished. No distress.  HENT:  Head: Normocephalic and atraumatic.  Eyes: Conjunctivae and EOM are normal. Pupils are equal, round, and reactive to light.  Neck: Normal range of motion. Neck supple. No thyromegaly present.  Cardiovascular: Normal rate, regular rhythm, normal heart sounds and intact distal pulses.   No murmur heard. Pulmonary/Chest: Effort normal and breath sounds normal. No respiratory distress.  Abdominal: Soft. She exhibits no distension. There is no tenderness.  Musculoskeletal: She exhibits no edema.  Lymphadenopathy:    She has no cervical adenopathy.  Neurological: She is alert and oriented to person, place, and time.  Skin: Skin is warm and dry.  Psychiatric: She has a normal mood and affect. Her behavior is normal.  Vitals reviewed.         Assessment & Plan:

## 2015-11-15 ENCOUNTER — Other Ambulatory Visit: Payer: Self-pay

## 2015-11-15 DIAGNOSIS — C50511 Malignant neoplasm of lower-outer quadrant of right female breast: Secondary | ICD-10-CM

## 2015-11-15 MED ORDER — PROCHLORPERAZINE MALEATE 10 MG PO TABS: 10.0000 mg | ORAL_TABLET | Freq: Four times a day (QID) | ORAL | 1 refills | Status: DC | PRN

## 2015-11-15 NOTE — Assessment & Plan Note (Signed)
Chronic problem.  Currently diet controlled.  Pt continues to lose weight but admits to increased fruit intake recently as this is the only thing that tastes good while on chemo.  UTD on foot exam, eye exam.  No longer on ARB- will get microalbumin.  Reviewed need for low carb diet and regular exercise as she is able.  Check labs.  Start meds prn.

## 2015-11-16 ENCOUNTER — Other Ambulatory Visit: Payer: Self-pay | Admitting: *Deleted

## 2015-11-16 DIAGNOSIS — C50511 Malignant neoplasm of lower-outer quadrant of right female breast: Secondary | ICD-10-CM

## 2015-11-16 MED ORDER — PROCHLORPERAZINE MALEATE 10 MG PO TABS: 10.0000 mg | ORAL_TABLET | Freq: Four times a day (QID) | ORAL | 0 refills | Status: DC | PRN

## 2015-11-18 ENCOUNTER — Encounter: Payer: Medicare Other | Admitting: Physical Therapy

## 2015-11-20 ENCOUNTER — Encounter (HOSPITAL_COMMUNITY): Payer: Self-pay

## 2015-11-20 ENCOUNTER — Emergency Department (HOSPITAL_COMMUNITY)
Admission: EM | Admit: 2015-11-20 | Discharge: 2015-11-21 | Disposition: A | Discharge status: Home / Self Care | Payer: Medicare Other | Attending: Emergency Medicine | Admitting: Emergency Medicine

## 2015-11-20 DIAGNOSIS — C50512 Malignant neoplasm of lower-outer quadrant of left female breast: Secondary | ICD-10-CM | POA: Insufficient documentation

## 2015-11-20 DIAGNOSIS — C50511 Malignant neoplasm of lower-outer quadrant of right female breast: Secondary | ICD-10-CM | POA: Diagnosis not present

## 2015-11-20 DIAGNOSIS — Z7982 Long term (current) use of aspirin: Secondary | ICD-10-CM | POA: Diagnosis not present

## 2015-11-20 DIAGNOSIS — Z79899 Other long term (current) drug therapy: Secondary | ICD-10-CM | POA: Insufficient documentation

## 2015-11-20 DIAGNOSIS — E119 Type 2 diabetes mellitus without complications: Secondary | ICD-10-CM | POA: Diagnosis not present

## 2015-11-20 DIAGNOSIS — E039 Hypothyroidism, unspecified: Secondary | ICD-10-CM | POA: Insufficient documentation

## 2015-11-20 DIAGNOSIS — J45909 Unspecified asthma, uncomplicated: Secondary | ICD-10-CM | POA: Insufficient documentation

## 2015-11-20 DIAGNOSIS — L02214 Cutaneous abscess of groin: Secondary | ICD-10-CM | POA: Insufficient documentation

## 2015-11-20 DIAGNOSIS — I10 Essential (primary) hypertension: Secondary | ICD-10-CM | POA: Diagnosis not present

## 2015-11-20 NOTE — ED Triage Notes (Signed)
Pt states that she had a bump in her R groin that began bleeding today. Bleeding controlled at this time. Pt is on chemotherapy for breast cancer. Last treatment was July 27. Pt denies N/V/F. A&Ox4.

## 2015-11-21 ENCOUNTER — Telehealth: Payer: Self-pay | Admitting: *Deleted

## 2015-11-21 ENCOUNTER — Telehealth: Payer: Self-pay

## 2015-11-21 MED ORDER — SULFAMETHOXAZOLE-TRIMETHOPRIM 800-160 MG PO TABS: 1.0000 | ORAL_TABLET | Freq: Two times a day (BID) | ORAL | 0 refills | Status: AC

## 2015-11-21 MED ORDER — CEPHALEXIN 500 MG PO CAPS: 500.0000 mg | ORAL_CAPSULE | Freq: Four times a day (QID) | ORAL | 0 refills | Status: DC

## 2015-11-21 NOTE — ED Notes (Signed)
MD at bedside. 

## 2015-11-21 NOTE — ED Provider Notes (Signed)
[facility] DEPT Provider Note   CSN: [id number] Arrival date & time: 11/20/15  2034  By signing my name below, I, Irene Pap, attest that this documentation has been prepared under the direction and in the presence of Veryl Speak, MD. Electronically Signed: Irene Pap, ED Scribe. 11/21/15. 12:51 AM.  First Provider Contact:  None    History   Chief Complaint Chief Complaint  Patient presents with  . Abscess    R groin, Chemo card   The history is provided by the patient. No language interpreter was used.    HPI Comments: Tracey Evans is a 70 y.o. Female with a hx of breast cancer, fibromyalgia, HTN, and Type II DM who presents to the Emergency Department complaining of a gradually worsening abscess to the right groin onset. Pt states that the abscess began to bleed today. She states that she wears Depends adult diapers and does not know if the material is rubbing against her skin causing the abscess. Pt is on chemotherapy for breast cancer and her last treatment was 11/03/15. She denies fever, nausea, and vomiting.   Past Medical History:  Diagnosis Date  . Angina at rest [facility])    "occurs whenever it wants to, but worse during agitation"  . Anxiety   . Asthma   . Binge eating disorder   . Breast cancer of lower-outer quadrant of left female breast ([place]) 07/08/2015   "NEVER HAD LEFT BREAST CANCER" (08/15/2015)  . Cancer of right breast ([place]) 08/15/2015  . Chronic bronchitis ([place])   . Chronic lower back pain   . Complication of anesthesia    had bronc spasms during intubation surgery 2009 on foot-need albuterol inhaler or neb tx preop  . Costochondritis   . Depression   . Diverticular disease   . Fibromyalgia   . GERD (gastroesophageal reflux disease)   . Hot flashes   . Hyperlipidemia   . Hypertension   . Hypothyroidism   . Osteoarthritis    "all over"  . Peptic ulcer disease   . Shortness of breath dyspnea   . Type II diabetes mellitus ([place])    "diet  controlled" (08/15/2015)    Patient Active Problem List   Diagnosis Date Noted  . Syncope 10/05/2015  . Genetic testing 08/21/2015  . Family history of breast cancer in female 07/15/2015  . Family history of colon cancer 07/15/2015  . Breast cancer of lower-outer quadrant of right female breast ([place]) 07/08/2015  . Disturbance in sleep behavior 11/18/2014  . Fibromyalgia 10/04/2014  . Flu-like symptoms 07/09/2014  . Diverticulitis of colon (without mention of hemorrhage) 12/01/2013  . UTI (urinary tract infection) 12/01/2013  . Abdominal pain, unspecified site 12/01/2013  . Dry mouth 04/29/2013  . Dry eye 04/29/2013  . Dry skin 04/29/2013  . Obesity (BMI 30-39.9) 03/11/2013  . Sweating profusely 01/16/2013  . Grief 10/17/2012  . Edema 09/04/2012  . Depression 09/04/2012  . Acute bronchitis 05/30/2012  . Peripheral neuropathy ([place]) 05/12/2012  . OAB (overactive bladder) 04/01/2012  . Hypokalemia 02/06/2012  . DM w/o complication type II ([place]) 02/06/2012  . Colitis 01/21/2012  . Physical exam, annual 10/22/2011  . Allergy to dogs 04/19/2011  . Polyarthralgia 09/18/2010  . Diverticulitis 09/10/2010  . Obesity 07/25/2010  . DIVERTICULOSIS, COLON 06/02/2010  . STRESS INCONTINENCE 04/25/2010  . ASTHMA 04/11/2010  . VERTIGO 04/11/2010  . ACUTE SINUSITIS, UNSPECIFIED 04/04/2010  . Hypothyroidism 01/19/2010  . Hyperlipidemia associated with type 2 diabetes mellitus ([place]) 01/19/2010  . DEPRESSION 01/19/2010  .  Essential hypertension 01/19/2010  . GERD 01/19/2010  . PEPTIC ULCER DISEASE 01/19/2010  . OSTEOARTHRITIS 01/19/2010  . LOW BACK PAIN 01/19/2010    Past Surgical History:  Procedure Laterality Date  . ABDOMINAL HYSTERECTOMY     "left my ovaries"  . ANKLE ARTHROSCOPY  01/02/2012   Procedure: ANKLE ARTHROSCOPY;  Surgeon: Colin Rhein, MD;  Location: [facility];  Service: Orthopedics;  Laterality: Right;  right ankle arthroscopy with extensive debridement  , dridement and drilling talar dome osteochondral lesion  . ANKLE FRACTURE SURGERY Right 2009   Dr. Rolena Infante  . BREAST BIOPSY  07/2015  . CARDIAC CATHETERIZATION    . CARPAL TUNNEL RELEASE Bilateral   . COLONOSCOPY    . DILATION AND CURETTAGE OF UTERUS     "before hysterectomy"  . FRACTURE SURGERY    . KNEE ARTHROSCOPY Bilateral   . LAPAROSCOPIC CHOLECYSTECTOMY    . MASTECTOMY COMPLETE / SIMPLE W/ SENTINEL NODE BIOPSY Right 08/15/2015  . MASTECTOMY W/ SENTINEL NODE BIOPSY Right 08/15/2015   Procedure: TOTAL MASTECTOMY WITH SENTINEL LYMPH NODE BIOPSY AND BLUE DYE INJECTION ;  Surgeon: Fanny Skates, MD;  Location: [facility];  Service: General;  Laterality: Right;  . PORTACATH PLACEMENT Left 08/15/2015  . PORTACATH PLACEMENT Left 08/15/2015   Procedure: INSERTION PORT-A-CATH ;  Surgeon: Fanny Skates, MD;  Location: [facility];  Service: General;  Laterality: Left;  . SHOULDER ARTHROSCOPY Bilateral 2009-2011   "bone spurs"  . TONSILLECTOMY      OB History    No data available       Home Medications    Prior to Admission medications   Medication Sig Start Date End Date Taking? Authorizing Provider  albuterol (PROAIR HFA) 108 (90 BASE) MCG/ACT inhaler Inhale 2 puffs into the lungs every 6 (six) hours as needed for wheezing. 08/30/14  Yes Midge Minium, MD  ALPRAZolam Duanne Moron) 1 MG tablet Take 1 tablet (1 mg total) by mouth 2 (two) times daily as needed. for anxiety 10/24/15  Yes Midge Minium, MD  aspirin 81 MG tablet Take 81 mg by mouth daily.   Yes Historical Provider, MD  atorvastatin (LIPITOR) 20 MG tablet TAKE 1 TABLET BY MOUTH EVERY DAY Patient taking differently: TAKE 1 TABLET BY MOUTH EVERY EVENING 03/07/15  Yes Midge Minium, MD  buPROPion (WELLBUTRIN XL) 300 MG 24 hr tablet TAKE 1 TABLET BY MOUTH EVERY DAY 10/24/15  Yes Midge Minium, MD  levothyroxine (SYNTHROID, LEVOTHROID) 75 MCG tablet Take 1 tablet (75 mcg total) by mouth daily. 09/07/15  Yes Midge Minium, MD    LORazepam (ATIVAN) 0.5 MG tablet Take 1 tablet (0.5 mg total) by mouth at bedtime. Patient taking differently: Take 0.5 mg by mouth at bedtime as needed for sleep.  08/24/15  Yes Nicholas Lose, MD  magic mouthwash w/lidocaine SOLN 77ml Swish,Swallow, or spit 4 times a day as needed 09/30/15  Yes Nicholas Lose, MD  meloxicam (MOBIC) 15 MG tablet Take 15 mg by mouth daily.  06/04/15  Yes Historical Provider, MD  nitroGLYCERIN (NITROSTAT) 0.4 MG SL tablet Place 1 tablet (0.4 mg total) under the tongue every 5 (five) minutes as needed. As needed for chest pain 11/18/14  Yes Midge Minium, MD  Polyethyl Glycol-Propyl Glycol (SYSTANE ULTRA) 0.4-0.3 % SOLN Place 1 drop into both eyes 3 (three) times daily as needed (for dry eyes).    Yes Historical Provider, MD  prochlorperazine (COMPAZINE) 10 MG tablet Take 1 tablet (10 mg total) by  mouth every 6 (six) hours as needed (Nausea or vomiting). 11/16/15  Yes Nicholas Lose, MD  ranitidine (ZANTAC) 150 MG tablet Take 150 mg by mouth 2 (two) times daily as needed for heartburn. Reported on 10/05/2015   Yes Historical Provider, MD  traMADol (ULTRAM) 50 MG tablet Take 50 mg by mouth daily as needed for moderate pain. Reported on 10/05/2015 04/08/15  Yes Historical Provider, MD  dexamethasone (DECADRON) 4 MG tablet Take 1 tablet (4 mg total) by mouth 2 (two) times daily. Start the day before Taxotere. Then again the day after chemo for 1 day. Patient not taking: Reported on 11/21/2015 08/24/15   Nicholas Lose, MD  lidocaine-prilocaine (EMLA) cream Apply to affected area once 08/24/15   Nicholas Lose, MD  ondansetron (ZOFRAN) 8 MG tablet Take 1 tablet (8 mg total) by mouth 2 (two) times daily as needed for refractory nausea / vomiting. Start on day 3 after chemo. Patient not taking: Reported on 11/21/2015 08/24/15   Nicholas Lose, MD    Family History Family History  Problem Relation Age of Onset  . Dementia Mother   . Stroke Mother   . Heart Problems Mother   . Dementia  Father   . Alcohol abuse Father   . Colon cancer Father     dx. 50 or younger  . Breast cancer Sister 58    inflammatory  . Diverticulitis Sister     maternal half-sister; severe - causing partial colectomy  . Breast cancer Sister     maternal half-sister; dx. early 84s  . Breast cancer Other 74    niece  . Breast cancer Other     niece dx. 69s  . Alcohol abuse Brother     Social History Social History  Substance Use Topics  . Smoking status: Never Smoker  . Smokeless tobacco: Never Used  . Alcohol use 0.0 oz/week     Comment: 08/15/2015 "1/2 glass of wine q 2 months"      Allergies   Contrast media [iodinated diagnostic agents]; Dilaudid [hydromorphone hcl]; Adhesive [tape]; Codeine; and Lactose intolerance (gi)   Review of Systems Review of Systems  Constitutional: Negative for fever.  Gastrointestinal: Negative for nausea and vomiting.  Skin: Positive for wound.  All other systems reviewed and are negative.  Physical Exam Updated Vital Signs BP 149/93 (BP Location: Left Arm)   Pulse 102   Temp 98.1 F (36.7 C) (Oral)   Resp 18   SpO2 99%   Physical Exam  Constitutional: She is oriented to person, place, and time. She appears well-developed and well-nourished.  HENT:  Head: Normocephalic.  Eyes: EOM are normal.  Neck: Normal range of motion.  Pulmonary/Chest: Effort normal.  Abdominal: She exhibits no distension.  Musculoskeletal: Normal range of motion.  Neurological: She is alert and oriented to person, place, and time.  Skin:  There is a small, draining, indurated area to the right inguinal region adjacent to the labia. No significant redness or streaking. Chaperone present during exam  Psychiatric: She has a normal mood and affect.  Nursing note and vitals reviewed.    ED Treatments / Results  DIAGNOSTIC STUDIES: Oxygen Saturation is 99% on RA, normal by my interpretation.    COORDINATION OF CARE: 12:48 AM-Discussed treatment plan which  includes antibiotics with pt at bedside and pt agreed to plan.    Labs (all labs ordered are listed, but only abnormal results are displayed) Labs Reviewed - No data to display  EKG  EKG Interpretation None  Radiology No results found.  Procedures Procedures (including critical care time)  Medications Ordered in ED Medications - No data to display   Initial Impression / Assessment and Plan / ED Course  I have reviewed the triage vital signs and the nursing notes.  Pertinent labs & imaging results that were available during my care of the patient were reviewed by me and considered in my medical decision making (see chart for details).  Clinical Course      Final Clinical Impressions(s) / ED Diagnoses   Final diagnoses:  None   Patient presents with a small abscess to the right groin. This has spontaneously ruptured and is draining. She will be treated with Keflex, Bactrim, warm soaks, and when necessary return.   I personally performed the services described in this documentation, which was scribed in my presence. The recorded information has been reviewed and is accurate.     New Prescriptions New Prescriptions   No medications on file     Veryl Speak, MD 11/21/15 [phone]

## 2015-11-21 NOTE — Discharge Instructions (Signed)
Keflex and Bactrim as prescribed.  Apply warm compresses to the area as frequently as possible for the next several days.  Return to the emergency department if you develop worsening pain, swelling, high fever, or other new and concerning symptoms.

## 2015-11-21 NOTE — Telephone Encounter (Signed)
Patient called stating that she was in the emergency room last night & the MD prescribed her some medicine, but she wanted to ask Dr. Lindi Adie if she could take and if so, how would it affect her chemo treatment for this Thurs.   Talked with Dr. Lindi Adie and he wants her to start the medicine today and he will access her on Thursday to see if she can have treatment or not. Informed the pt to start meds and he will verify that the infection is cleared up to have treatment and if not then he will postpone treatment and the patient is fine with this.

## 2015-11-21 NOTE — Telephone Encounter (Signed)
Call report rcvd from Team [facility] dtd 11/20/15 1416 - no follow up needed.  Sent to scan.

## 2015-11-24 ENCOUNTER — Encounter: Payer: Self-pay | Admitting: Hematology and Oncology

## 2015-11-24 ENCOUNTER — Encounter: Payer: Self-pay | Admitting: *Deleted

## 2015-11-24 ENCOUNTER — Ambulatory Visit (HOSPITAL_BASED_OUTPATIENT_CLINIC_OR_DEPARTMENT_OTHER): Payer: Medicare Other | Admitting: Hematology and Oncology

## 2015-11-24 ENCOUNTER — Other Ambulatory Visit: Payer: Self-pay

## 2015-11-24 ENCOUNTER — Other Ambulatory Visit (HOSPITAL_BASED_OUTPATIENT_CLINIC_OR_DEPARTMENT_OTHER): Payer: Medicare Other

## 2015-11-24 ENCOUNTER — Other Ambulatory Visit (HOSPITAL_COMMUNITY): Payer: Self-pay | Admitting: *Deleted

## 2015-11-24 ENCOUNTER — Ambulatory Visit (HOSPITAL_BASED_OUTPATIENT_CLINIC_OR_DEPARTMENT_OTHER): Payer: Medicare Other

## 2015-11-24 VITALS — BP 126/83 | HR 91 | Temp 97.9°F | Resp 19 | Wt 188.2 lb

## 2015-11-24 DIAGNOSIS — G620 Drug-induced polyneuropathy: Secondary | ICD-10-CM | POA: Diagnosis not present

## 2015-11-24 DIAGNOSIS — C50511 Malignant neoplasm of lower-outer quadrant of right female breast: Secondary | ICD-10-CM

## 2015-11-24 DIAGNOSIS — Z170 Estrogen receptor positive status [ER+]: Secondary | ICD-10-CM | POA: Diagnosis not present

## 2015-11-24 DIAGNOSIS — Z5112 Encounter for antineoplastic immunotherapy: Secondary | ICD-10-CM

## 2015-11-24 DIAGNOSIS — Z5189 Encounter for other specified aftercare: Secondary | ICD-10-CM

## 2015-11-24 DIAGNOSIS — C50411 Malignant neoplasm of upper-outer quadrant of right female breast: Secondary | ICD-10-CM

## 2015-11-24 DIAGNOSIS — Z5111 Encounter for antineoplastic chemotherapy: Secondary | ICD-10-CM | POA: Diagnosis not present

## 2015-11-24 DIAGNOSIS — D6481 Anemia due to antineoplastic chemotherapy: Secondary | ICD-10-CM | POA: Insufficient documentation

## 2015-11-24 DIAGNOSIS — T451X5A Adverse effect of antineoplastic and immunosuppressive drugs, initial encounter: Principal | ICD-10-CM

## 2015-11-24 LAB — COMPREHENSIVE METABOLIC PANEL
ALT: 12 U/L (ref 0–55)
AST: 19 U/L (ref 5–34)
Albumin: 3.6 g/dL (ref 3.5–5.0)
Alkaline Phosphatase: 101 U/L (ref 40–150)
Anion Gap: 9 mEq/L (ref 3–11)
BUN: 13.6 mg/dL (ref 7.0–26.0)
CHLORIDE: 109 meq/L (ref 98–109)
CO2: 24 meq/L (ref 22–29)
CREATININE: 1.3 mg/dL — AB (ref 0.6–1.1)
Calcium: 9.5 mg/dL (ref 8.4–10.4)
EGFR: 51 mL/min/{1.73_m2} — ABNORMAL LOW (ref >90)
GLUCOSE: 104 mg/dL (ref 70–140)
Potassium: 4.8 mEq/L (ref 3.5–5.1)
Sodium: 141 mEq/L (ref 136–145)
TOTAL PROTEIN: 6.6 g/dL (ref 6.4–8.3)
Total Bilirubin: <0.3 mg/dL (ref 0.20–1.20)

## 2015-11-24 LAB — CBC WITH DIFFERENTIAL/PLATELET
BASO%: 0.4 % (ref 0.0–2.0)
BASOS ABS: 0 10*3/uL (ref 0.0–0.1)
EOS ABS: 0.1 10*3/uL (ref 0.0–0.5)
EOS%: 1.6 % (ref 0.0–7.0)
HCT: 31.5 % — ABNORMAL LOW (ref 34.8–46.6)
HGB: 10.5 g/dL — ABNORMAL LOW (ref 11.6–15.9)
LYMPH%: 30.9 % (ref 14.0–49.7)
MCH: 30.8 pg (ref 25.1–34.0)
MCHC: 33.3 g/dL (ref 31.5–36.0)
MCV: 92.4 fL (ref 79.5–101.0)
MONO#: 0.8 10*3/uL (ref 0.1–0.9)
MONO%: 10.5 % (ref 0.0–14.0)
NEUT#: 4.5 10*3/uL (ref 1.5–6.5)
NEUT%: 56.6 % (ref 38.4–76.8)
Platelets: 223 10*3/uL (ref 145–400)
RBC: 3.41 10*6/uL — AB (ref 3.70–5.45)
RDW: 17.4 % — ABNORMAL HIGH (ref 11.2–14.5)
WBC: 7.9 10*3/uL (ref 3.9–10.3)
lymph#: 2.4 10*3/uL (ref 0.9–3.3)

## 2015-11-24 MED ORDER — SODIUM CHLORIDE 0.9 % IV SOLN
Freq: Once | INTRAVENOUS | Status: AC
  Administered 2015-11-24: 11:00:00 via INTRAVENOUS

## 2015-11-24 MED ORDER — ACETAMINOPHEN 325 MG PO TABS
650.0000 mg | ORAL_TABLET | Freq: Once | ORAL | Status: AC
  Administered 2015-11-24: 650 mg via ORAL

## 2015-11-24 MED ORDER — DIPHENHYDRAMINE HCL 25 MG PO CAPS
ORAL_CAPSULE | ORAL | Status: AC
  Filled 2015-11-24: qty 2

## 2015-11-24 MED ORDER — DIPHENHYDRAMINE HCL 25 MG PO CAPS
50.0000 mg | ORAL_CAPSULE | Freq: Once | ORAL | Status: AC
  Administered 2015-11-24: 50 mg via ORAL

## 2015-11-24 MED ORDER — PEGFILGRASTIM 6 MG/0.6ML ~~LOC~~ PSKT
6.0000 mg | PREFILLED_SYRINGE | Freq: Once | SUBCUTANEOUS | Status: AC
  Administered 2015-11-24: 6 mg via SUBCUTANEOUS
  Filled 2015-11-24: qty 0.6

## 2015-11-24 MED ORDER — SODIUM CHLORIDE 0.9 % IV SOLN
Freq: Once | INTRAVENOUS | Status: AC
  Administered 2015-11-24: 11:00:00 via INTRAVENOUS
  Filled 2015-11-24: qty 5

## 2015-11-24 MED ORDER — SODIUM CHLORIDE 0.9 % IV SOLN
50.0000 mg/m2 | Freq: Once | INTRAVENOUS | Status: AC
  Administered 2015-11-24: 100 mg via INTRAVENOUS
  Filled 2015-11-24: qty 10

## 2015-11-24 MED ORDER — HEPARIN SOD (PORK) LOCK FLUSH 100 UNIT/ML IV SOLN
500.0000 [IU] | Freq: Once | INTRAVENOUS | Status: AC | PRN
  Administered 2015-11-24: 500 [IU]
  Filled 2015-11-24: qty 5

## 2015-11-24 MED ORDER — PALONOSETRON HCL INJECTION 0.25 MG/5ML
0.2500 mg | Freq: Once | INTRAVENOUS | Status: AC
  Administered 2015-11-24: 0.25 mg via INTRAVENOUS

## 2015-11-24 MED ORDER — SODIUM CHLORIDE 0.9% FLUSH
10.0000 mL | INTRAVENOUS | Status: DC | PRN
  Administered 2015-11-24: 10 mL
  Filled 2015-11-24: qty 10

## 2015-11-24 MED ORDER — GABAPENTIN 100 MG PO CAPS: ORAL_CAPSULE | ORAL | 0 refills | Status: DC

## 2015-11-24 MED ORDER — TRASTUZUMAB CHEMO 150 MG IV SOLR
6.0000 mg/kg | Freq: Once | INTRAVENOUS | Status: AC
  Administered 2015-11-24: 546 mg via INTRAVENOUS
  Filled 2015-11-24: qty 26

## 2015-11-24 MED ORDER — PALONOSETRON HCL INJECTION 0.25 MG/5ML
INTRAVENOUS | Status: AC
  Filled 2015-11-24: qty 5

## 2015-11-24 MED ORDER — ACETAMINOPHEN 325 MG PO TABS
ORAL_TABLET | ORAL | Status: AC
  Filled 2015-11-24: qty 2

## 2015-11-24 NOTE — Assessment & Plan Note (Signed)
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 Adjuvant chemotherapy with [place] 2. Followed by adjuvant antiestrogen therapy with anastrozole 5 years No role of radiation since she had mastectomy. ----------------------------------------------------------------------------------------------------------------------- Current treatment: Cycle 4 day 1 TCH Echocardiogram was reviewed 08/25/2015: EF 65-70%  Chemotherapy toxicities: 1. Syncope: Patient went to the emergency room and was discharged home, no further problems since then 2. Diarrhea due to chemotherapy: Encouraged her to take Imodium and given written instructions on Imodium. She did not have diarrhea with cycle 2. 3. Nausea and vomitingdue to chemotherapy: In spite of adding Emend, patient had persistent nausea and vomiting that lasted 24 hours. We reduced the dosage of Cycle 3 chemotherapy. 4. Decreased appetite 5. Insomnia 6. Alopecia  Return to clinic with cycle 5.

## 2015-11-24 NOTE — Progress Notes (Signed)
Patient Care Team: Midge Minium, MD as PCP - General [name], MD as Consulting Physician (Cardiology) Marylynn Pearson, MD as Consulting Physician (Ophthalmology) Nicholas Lose, MD as Consulting Physician (Hematology and Oncology) Kyung Rudd, MD as Consulting Physician (Radiation Oncology) Fanny Skates, MD as Consulting Physician (General Surgery)  DIAGNOSIS: Breast cancer of lower-outer quadrant of right female breast [facility])   Staging form: Breast, AJCC 7th Edition   - Clinical stage from 07/13/2015: Stage IA (T1c, N0, M0) - Unsigned  SUMMARY OF ONCOLOGIC HISTORY:   Breast cancer of lower-outer quadrant of right female breast ([place])   07/06/2015 Initial Diagnosis    Right breast biopsy posterior: IDC ER 90%, PR 5%, Ki-67 60%, HER-2 positive ratio 1.42,copy #6.1 T1c N0 stage IA; Right breast biopsy inferior medial: High-grade DCIS with comedonecrosis; ER 100%, PR 90%; 5 mm calcs     07/27/2015 Procedure    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 She complains of the neuropathy has gotten much worse. It's of the tips of the fingers but extending into the first digit. She has had slight difficulty with buttoning her shirt. She has had great appetite and has been eating quite well. Denies any nausea or vomiting. Denies any fevers or chills. She had gone to the emergency room for a in while  infection which has been drained and she feels better and she denies any fevers or chills. She is currently an antibiotic.  REVIEW OF SYSTEMS:   Constitutional: Denies fevers, chills or abnormal weight loss Eyes: Denies blurriness of vision Ears, nose, mouth, throat, and face: Denies mucositis or sore throat Respiratory: Denies cough, dyspnea or wheezes Cardiovascular: Denies palpitation, chest discomfort Gastrointestinal:  Denies nausea, heartburn or change in bowel habits Skin: Denies abnormal skin rashes Lymphatics: Denies new lymphadenopathy or easy bruising Neurological:Denies numbness, tingling or new weaknesses Behavioral/Psych: Mood is stable, no new changes  Extremities: No lower extremity edema Breast:  denies any pain or lumps or nodules in either breasts All other systems were reviewed with the patient and are negative.  I have reviewed the past medical history, past surgical history, social history and family history with the patient and they are unchanged from previous note.  ALLERGIES:  is allergic to contrast media [iodinated diagnostic agents]; dilaudid [hydromorphone hcl]; adhesive [tape]; codeine; and lactose intolerance (gi).  MEDICATIONS:  Current Outpatient Prescriptions  Medication Sig Dispense Refill  . albuterol (PROAIR HFA) 108 (90 BASE) MCG/ACT inhaler Inhale 2 puffs into the lungs every 6 (six) hours as needed for wheezing. 1 Inhaler 3  . ALPRAZolam (XANAX) 1 MG tablet Take 1 tablet (1 mg total) by mouth 2 (two) times daily as needed. for anxiety 60 tablet 3  . aspirin 81 MG tablet Take 81 mg by mouth daily.    Marland Kitchen atorvastatin (LIPITOR) 20 MG tablet TAKE 1 TABLET BY MOUTH EVERY DAY (Patient taking differently: TAKE 1  TABLET BY MOUTH EVERY EVENING) 30 tablet 6  . buPROPion (WELLBUTRIN XL) 300 MG 24 hr tablet TAKE 1 TABLET BY MOUTH EVERY DAY 30 tablet 3  . cephALEXin (KEFLEX) 500 MG capsule Take 1 capsule (500 mg total) by mouth 4 (four) times daily. 28 capsule 0   . dexamethasone (DECADRON) 4 MG tablet Take 1 tablet (4 mg total) by mouth 2 (two) times daily. Start the day before Taxotere. Then again the day after chemo for 1 day. (Patient not taking: Reported on 11/21/2015) 30 tablet 0  . levothyroxine (SYNTHROID, LEVOTHROID) 75 MCG tablet Take 1 tablet (75 mcg total) by mouth daily. 30 tablet 6  . lidocaine-prilocaine (EMLA) cream Apply to affected area once 30 g 3  . LORazepam (ATIVAN) 0.5 MG tablet Take 1 tablet (0.5 mg total) by mouth at bedtime. (Patient taking differently: Take 0.5 mg by mouth at bedtime as needed for sleep. ) 30 tablet 0  . magic mouthwash w/lidocaine SOLN 9m Swish,Swallow, or spit 4 times a day as needed 240 mL 1  . meloxicam (MOBIC) 15 MG tablet Take 15 mg by mouth daily.     . nitroGLYCERIN (NITROSTAT) 0.4 MG SL tablet Place 1 tablet (0.4 mg total) under the tongue every 5 (five) minutes as needed. As needed for chest pain 30 tablet 3  . ondansetron (ZOFRAN) 8 MG tablet Take 1 tablet (8 mg total) by mouth 2 (two) times daily as needed for refractory nausea / vomiting. Start on day 3 after chemo. (Patient not taking: Reported on 11/21/2015) 30 tablet 1  . Polyethyl Glycol-Propyl Glycol (SYSTANE ULTRA) 0.4-0.3 % SOLN Place 1 drop into both eyes 3 (three) times daily as needed (for dry eyes).     . prochlorperazine (COMPAZINE) 10 MG tablet Take 1 tablet (10 mg total) by mouth every 6 (six) hours as needed (Nausea or vomiting). 90 tablet 0  . ranitidine (ZANTAC) 150 MG tablet Take 150 mg by mouth 2 (two) times daily as needed for heartburn. Reported on 10/05/2015    . sulfamethoxazole-trimethoprim (BACTRIM DS,SEPTRA DS) 800-160 MG tablet Take 1 tablet by mouth 2 (two) times daily. 14 tablet 0  . traMADol (ULTRAM) 50 MG tablet Take 50 mg by mouth daily as needed for moderate pain. Reported on 10/05/2015  0   No current facility-administered medications for this visit.     PHYSICAL EXAMINATION: ECOG PERFORMANCE STATUS: 1 - Symptomatic but  completely ambulatory  Vitals:   11/24/15 0927  BP: 126/83  Pulse: 91  Resp: 19  Temp: 97.9 F (36.6 C)   Filed Weights   11/24/15 0927  Weight: 188 lb 3.2 oz (85.4 kg)    GENERAL:alert, no distress and comfortable SKIN: skin color, texture, turgor are normal, no rashes or significant lesions EYES: normal, Conjunctiva are pink and non-injected, sclera clear OROPHARYNX:no exudate, no erythema and lips, buccal mucosa, and tongue normal  NECK: supple, thyroid normal size, non-tender, without nodularity LYMPH:  no palpable lymphadenopathy in the cervical, axillary or inguinal LUNGS: clear to auscultation and percussion with normal breathing effort HEART: regular rate & rhythm and no murmurs and no lower extremity edema ABDOMEN:abdomen soft, non-tender and normal bowel sounds MUSCULOSKELETAL:no cyanosis of digits and no clubbing  NEURO: alert & oriented x 3 with fluent speech, no focal motor/sensory deficits EXTREMITIES: No lower extremity edema  LABORATORY DATA:  I have reviewed the data as listed   Chemistry      Component Value Date/Time   NA 144 11/03/2015 0953  K 4.0 11/03/2015 0953   CL 107 09/27/2015 0657   CO2 27 11/03/2015 0953   BUN 11.3 11/03/2015 0953   CREATININE 1.0 11/03/2015 0953      Component Value Date/Time   CALCIUM 8.9 11/03/2015 0953   ALKPHOS 88 11/03/2015 0953   AST 20 11/03/2015 0953   ALT 18 11/03/2015 0953   BILITOT 0.31 11/03/2015 0953       Lab Results  Component Value Date   WBC 7.9 11/24/2015   HGB 10.5 (L) 11/24/2015   HCT 31.5 (L) 11/24/2015   MCV 92.4 11/24/2015   PLT 223 11/24/2015   NEUTROABS 4.5 11/24/2015     ASSESSMENT & PLAN:  Breast cancer of lower-outer quadrant of right female breast ([place]) Right mastectomy 08/15/2015: IDC grade 2, 2.2 cm, with associated DCIS intermediate grade, separate focus high-grade DCIS, 0/4 lymph nodes negative, T2 N0 stage II a, ER 90%, PR 5%, HER-2 negative duration 1.42, Ki-67  60%  Treatment plan: 1. Adjuvant chemotherapy with [place] 2. Followed by adjuvant antiestrogen therapy with anastrozole 5 years No role of radiation since she had mastectomy. ----------------------------------------------------------------------------------------------------------------------- Current treatment: Cycle 4 day 1 TCH Echocardiogram was reviewed 08/25/2015: EF 65-70%  Chemotherapy toxicities: 1. Syncope: Patient went to the emergency room and was discharged home, no further problems since then 2. Diarrhea due to chemotherapy: Encouraged her to take Imodium and given written instructions on Imodium. She did not have diarrhea with cycle 2. 3. Nausea and vomitingdue to chemotherapy: In spite of adding Emend, patient had persistent nausea and vomiting that lasted 24 hours. We reduced the dosage of Cycle 3 chemotherapy. 4. Decreased appetite 5. Insomnia 6. Alopecia 7. 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 If she continues to have worsening neuropathy then we may have to discontinue Taxotere completely.  Return to clinic with cycle 5.   No orders of the defined types were placed in this encounter.  The patient has a good understanding of the overall plan. she agrees with it. she will call with any problems that may develop before the next visit here.   Rulon Eisenmenger, MD 11/24/15

## 2015-11-24 NOTE — Patient Instructions (Signed)
[facility] Discharge Instructions for Patients Receiving Chemotherapy  Today you received the following chemotherapy agents:  Taxotere and Herceptin  To help prevent nausea and vomiting after your treatment, we encourage you to take your nausea medication as ordered per MD.   If you develop nausea and vomiting that is not controlled by your nausea medication, call the clinic.   BELOW ARE SYMPTOMS THAT SHOULD BE REPORTED IMMEDIATELY:  *FEVER GREATER THAN 100.5 F  *CHILLS WITH OR WITHOUT FEVER  NAUSEA AND VOMITING THAT IS NOT CONTROLLED WITH YOUR NAUSEA MEDICATION  *UNUSUAL SHORTNESS OF BREATH  *UNUSUAL BRUISING OR BLEEDING  TENDERNESS IN MOUTH AND THROAT WITH OR WITHOUT PRESENCE OF ULCERS  *URINARY PROBLEMS  *BOWEL PROBLEMS  UNUSUAL RASH Items with * indicate a potential emergency and should be followed up as soon as possible.  Feel free to call the clinic you have any questions or concerns. The clinic phone number is [phone].  Please show the [facility] at check-in to the Emergency Department and triage nurse.

## 2015-11-25 ENCOUNTER — Ambulatory Visit (HOSPITAL_BASED_OUTPATIENT_CLINIC_OR_DEPARTMENT_OTHER)
Admission: RE | Admit: 2015-11-25 | Discharge: 2015-11-25 | Disposition: A | Discharge status: Home / Self Care | Payer: Medicare Other | Source: Ambulatory Visit | Attending: Internal Medicine | Admitting: Internal Medicine

## 2015-11-25 ENCOUNTER — Ambulatory Visit (HOSPITAL_COMMUNITY)
Admission: RE | Admit: 2015-11-25 | Discharge: 2015-11-25 | Disposition: A | Discharge status: Home / Self Care | Payer: Medicare Other | Source: Ambulatory Visit | Attending: Internal Medicine | Admitting: Internal Medicine

## 2015-11-25 ENCOUNTER — Other Ambulatory Visit (HOSPITAL_COMMUNITY): Payer: Self-pay | Admitting: Internal Medicine

## 2015-11-25 VITALS — BP 118/78 | HR 93 | Wt 187.8 lb

## 2015-11-25 DIAGNOSIS — C50411 Malignant neoplasm of upper-outer quadrant of right female breast: Secondary | ICD-10-CM

## 2015-11-25 DIAGNOSIS — I351 Nonrheumatic aortic (valve) insufficiency: Secondary | ICD-10-CM | POA: Insufficient documentation

## 2015-11-25 DIAGNOSIS — Z09 Encounter for follow-up examination after completed treatment for conditions other than malignant neoplasm: Secondary | ICD-10-CM | POA: Diagnosis present

## 2015-11-25 LAB — ECHOCARDIOGRAM LIMITED
CHL CUP MV DEC (S): 169
E decel time: 169 msec
EERAT: 7.88
FS: 36 % (ref 28–44)
IV/PV OW: 1.1
LA diam end sys: 35 mm
LA diam index: 1.81 cm/m2
LASIZE: 35 mm
LV PW d: 7.27 mm — AB (ref 0.6–1.1)
LV e' LATERAL: 11.7 cm/s
LVEEAVG: 7.88
LVEEMED: 7.88
LVOT area: 2.84 cm2
LVOT diameter: 19 mm
MVPG: 3 mmHg
MVPKAVEL: 116 m/s
MVPKEVEL: 92.2 m/s
TDI e' lateral: 11.7
TDI e' medial: 8.38
Weight: 3004.8 oz

## 2015-11-25 NOTE — Patient Instructions (Signed)
We will contact you in 3 months to schedule your next appointment.  

## 2015-11-25 NOTE — Progress Notes (Signed)
  Echocardiogram 2D Echocardiogram has been performed.  Johny Chess 11/25/2015, 11:11 AM

## 2015-11-25 NOTE — Progress Notes (Signed)
  Echocardiogram 2D Echocardiogram has been performed.  Johny Chess 11/25/2015, 11:09 AM

## 2015-11-25 NOTE — Progress Notes (Signed)
Patient ID: Tracey Evans, female   DOB: [birth year], 70 y.o.   MRN: [id number]   CARDIO-ONCOLOGY CLINIC Note  Referring Physician: Lindi Adie Primary Care: Birdie Riddle Cardiologist: Terrence Dupont   HPI: 70 y/o woman with h/o obesity, GERD HTN, DM2 and HL referred by Dr. Lindi Adie for enrollment into cardio-oncology clinic.      Breast cancer of lower-outer quadrant of right female breast ([place])   07/06/2015 Initial Diagnosis Right breast biopsy posterior: IDC ER 90%, PR 5%, Ki-67 60%, HER-2 positive ratio 1.42,copy #6.1 T1c N0 stage IA; Right breast biopsy inferior medial: High-grade DCIS with comedonecrosis; ER 100%, PR 90%; 5 mm calcs   08/15/2015 Surgery Right mastectomy: IDC grade 2, 2.2 cm, with associated DCIS intermediate grade, separate focus high-grade DCIS, 0/4 lymph nodes negative, T2 N0 stage II a, ER 90%, PR 5%, HER-2 negative duration 1.42, Ki-67 60%          09/22/2015 -  Chemotherapy    Adjuvant chemotherapy with TCH 6 cycles followed by Herceptin maintenance for 1 year      She returns for follow up. Tolerating chemo well. Neuropathy resolving after stopping Taxotere.  Denies SOB/Orthopnea.    Echo 08/25/15: 60-65% lateral s' 10.6 cm/s  GLS -20.4% ECHO 8/04/17/2015: EF 65-70% Lat S' 10.9  GLS -17.6      Past Medical History:  Diagnosis Date  . Angina at rest [facility])    "occurs whenever it wants to, but worse during agitation"  . Anxiety   . Asthma   . Binge eating disorder   . Breast cancer of lower-outer quadrant of left female breast ([place]) 07/08/2015   "NEVER HAD LEFT BREAST CANCER" (08/15/2015)  . Cancer of right breast ([place]) 08/15/2015  . Chronic bronchitis ([place])   . Chronic lower back pain   . Complication of anesthesia    had bronc spasms during intubation surgery 2009 on foot-need albuterol inhaler or neb tx preop  . Costochondritis   . Depression   . Diverticular disease   . Fibromyalgia   . GERD (gastroesophageal reflux disease)   . Hot flashes   .  Hyperlipidemia   . Hypertension   . Hypothyroidism   . Osteoarthritis    "all over"  . Peptic ulcer disease   . Shortness of breath dyspnea   . Type II diabetes mellitus ([place])    "diet controlled" (08/15/2015)    Current Outpatient Prescriptions  Medication Sig Dispense Refill  . albuterol (PROAIR HFA) 108 (90 BASE) MCG/ACT inhaler Inhale 2 puffs into the lungs every 6 (six) hours as needed for wheezing. 1 Inhaler 3  . ALPRAZolam (XANAX) 1 MG tablet Take 1 tablet (1 mg total) by mouth 2 (two) times daily as needed. for anxiety 60 tablet 3  . aspirin 81 MG tablet Take 81 mg by mouth daily.    Marland Kitchen atorvastatin (LIPITOR) 20 MG tablet TAKE 1 TABLET BY MOUTH EVERY DAY (Patient taking differently: TAKE 1 TABLET BY MOUTH EVERY EVENING) 30 tablet 6  . buPROPion (WELLBUTRIN XL) 300 MG 24 hr tablet TAKE 1 TABLET BY MOUTH EVERY DAY 30 tablet 3  . cephALEXin (KEFLEX) 500 MG capsule Take 1 capsule (500 mg total) by mouth 4 (four) times daily. 28 capsule 0  . dexamethasone (DECADRON) 4 MG tablet Take 1 tablet (4 mg total) by mouth 2 (two) times daily. Start the day before Taxotere. Then again the day after chemo for 1 day. 30 tablet 0  . levothyroxine (SYNTHROID, LEVOTHROID) 75 MCG tablet Take 1 tablet (75  mcg total) by mouth daily. 30 tablet 6  . lidocaine-prilocaine (EMLA) cream Apply to affected area once 30 g 3  . LORazepam (ATIVAN) 0.5 MG tablet Take 1 tablet (0.5 mg total) by mouth at bedtime. (Patient taking differently: Take 0.5 mg by mouth at bedtime as needed for sleep. ) 30 tablet 0  . magic mouthwash w/lidocaine SOLN 66m Swish,Swallow, or spit 4 times a day as needed 240 mL 1  . meloxicam (MOBIC) 15 MG tablet Take 15 mg by mouth daily.     . nitroGLYCERIN (NITROSTAT) 0.4 MG SL tablet Place 1 tablet (0.4 mg total) under the tongue every 5 (five) minutes as needed. As needed for chest pain 30 tablet 3  . ondansetron (ZOFRAN) 8 MG tablet Take 1 tablet (8 mg total) by mouth 2 (two) times daily as  needed for refractory nausea / vomiting. Start on day 3 after chemo. 30 tablet 1  . Polyethyl Glycol-Propyl Glycol (SYSTANE ULTRA) 0.4-0.3 % SOLN Place 1 drop into both eyes 3 (three) times daily as needed (for dry eyes).     . prochlorperazine (COMPAZINE) 10 MG tablet Take 1 tablet (10 mg total) by mouth every 6 (six) hours as needed (Nausea or vomiting). 90 tablet 0  . ranitidine (ZANTAC) 150 MG tablet Take 150 mg by mouth 2 (two) times daily as needed for heartburn. Reported on 10/05/2015    . sulfamethoxazole-trimethoprim (BACTRIM DS,SEPTRA DS) 800-160 MG tablet Take 1 tablet by mouth 2 (two) times daily. 14 tablet 0  . traMADol (ULTRAM) 50 MG tablet Take 50 mg by mouth daily as needed for moderate pain. Reported on 10/05/2015  0   No current facility-administered medications for this encounter.     Allergies  Allergen Reactions  . Contrast Media [Iodinated Diagnostic Agents] Shortness Of Breath    Patient has SOB, tightness in chest and feels like an elephant is sitting on chest, patient should be  scanned at hospital Per Dr. BAlvester Chou . Dilaudid [Hydromorphone Hcl] Anaphylaxis    Pt stopped breathing  . Adhesive [Tape]     blister  . Codeine Nausea Only    insomnia  . Lactose Intolerance (Gi) Diarrhea      Social History   Social History  . Marital status: Single    Spouse name: N/A  . Number of children: N/A  . Years of education: N/A   Occupational History  . Not on file.   Social History Main Topics  . Smoking status: Never Smoker  . Smokeless tobacco: Never Used  . Alcohol use 0.0 oz/week     Comment: 08/15/2015 "1/2 glass of wine q 2 months"   . Drug use: No  . Sexual activity: Not Currently   Other Topics Concern  . Not on file   Social History Narrative  . No narrative on file      Family History  Problem Relation Age of Onset  . Dementia Mother   . Stroke Mother   . Heart Problems Mother   . Dementia Father   . Alcohol abuse Father   . Colon cancer  Father     dx. 731or younger  . Breast cancer Sister 566   inflammatory  . Diverticulitis Sister     maternal half-sister; severe - causing partial colectomy  . Breast cancer Sister     maternal half-sister; dx. early 856s . Breast cancer Other 437   niece  . Breast cancer Other     niece dx.  50s  . Alcohol abuse Brother     Vitals:   11/25/15 1105  BP: 118/78  Pulse: 93  SpO2: 98%  Weight: 187 lb 12.8 oz (85.2 kg)    PHYSICAL EXAM: General:  Well appearing. No respiratory difficulty HEENT: normal Neck: supple. no JVD. Carotids 2+ bilat; no bruits. No lymphadenopathy or thryomegaly appreciated. Cor: PMI nondisplaced. Regular rate & rhythm. No rubs, gallops or murmurs. Lungs: clear Abdomen: obese soft, nontender, nondistended. No hepatosplenomegaly. No bruits or masses. Good bowel sounds. Extremities: no cyanosis, clubbing, rash, mild edema on right (old fracture) Neuro: alert & oriented x 3, cranial nerves grossly intact. moves all 4 extremities w/o difficulty. Affect pleasant.   ASSESSMENT & PLAN: 1. Breast cancer of lower-outer quadrant of right female breast [facility]) Right mastectomy 08/15/2015: IDC grade 2, 2.2 cm, with associated DCIS intermediate grade, separate focus high-grade DCIS, 0/4 lymph nodes negative, T2 N0 stage II a, ER 90%, PR 5%, HER-2 negative duration 1.42, Ki-67 60%     1. Adjuvant chemotherapy with TCH fx 6 cycles followed by herceptin for 1 year.   2. Followed by adjuvant antiestrogen therapy with anastrozole 5 years    2. Snoring and fatigue    --consider sleep study.   Follow up in 3 months with an ECHO.   Amy Clegg,NP-C  11:42 AM   Patient seen and examined with Darrick Grinder, NP. We discussed all aspects of the encounter. I agree with the assessment and plan as stated above.   I reviewed echos personally. EF and Doppler parameters stable. No HF on exam. Continue Herceptin.   Bensimhon, Daniel,MD 11:00 PM

## 2015-12-01 DIAGNOSIS — M5136 Other intervertebral disc degeneration, lumbar region: Secondary | ICD-10-CM | POA: Diagnosis not present

## 2015-12-01 DIAGNOSIS — M150 Primary generalized (osteo)arthritis: Secondary | ICD-10-CM | POA: Diagnosis not present

## 2015-12-01 DIAGNOSIS — M797 Fibromyalgia: Secondary | ICD-10-CM | POA: Diagnosis not present

## 2015-12-15 ENCOUNTER — Ambulatory Visit (HOSPITAL_BASED_OUTPATIENT_CLINIC_OR_DEPARTMENT_OTHER): Payer: Medicare Other

## 2015-12-15 ENCOUNTER — Other Ambulatory Visit (HOSPITAL_BASED_OUTPATIENT_CLINIC_OR_DEPARTMENT_OTHER): Payer: Medicare Other

## 2015-12-15 ENCOUNTER — Encounter: Payer: Self-pay | Admitting: Hematology and Oncology

## 2015-12-15 ENCOUNTER — Ambulatory Visit (HOSPITAL_BASED_OUTPATIENT_CLINIC_OR_DEPARTMENT_OTHER): Payer: Medicare Other | Admitting: Hematology and Oncology

## 2015-12-15 DIAGNOSIS — R197 Diarrhea, unspecified: Secondary | ICD-10-CM

## 2015-12-15 DIAGNOSIS — Z5111 Encounter for antineoplastic chemotherapy: Secondary | ICD-10-CM | POA: Diagnosis not present

## 2015-12-15 DIAGNOSIS — Z5112 Encounter for antineoplastic immunotherapy: Secondary | ICD-10-CM

## 2015-12-15 DIAGNOSIS — Z5189 Encounter for other specified aftercare: Secondary | ICD-10-CM | POA: Diagnosis not present

## 2015-12-15 DIAGNOSIS — Z170 Estrogen receptor positive status [ER+]: Secondary | ICD-10-CM

## 2015-12-15 DIAGNOSIS — G620 Drug-induced polyneuropathy: Secondary | ICD-10-CM

## 2015-12-15 DIAGNOSIS — C50511 Malignant neoplasm of lower-outer quadrant of right female breast: Secondary | ICD-10-CM

## 2015-12-15 LAB — COMPREHENSIVE METABOLIC PANEL
ALBUMIN: 3.6 g/dL (ref 3.5–5.0)
ALK PHOS: 95 U/L (ref 40–150)
ALT: 17 U/L (ref 0–55)
ANION GAP: 9 meq/L (ref 3–11)
AST: 23 U/L (ref 5–34)
BILIRUBIN TOTAL: 0.3 mg/dL (ref 0.20–1.20)
BUN: 16.8 mg/dL (ref 7.0–26.0)
CALCIUM: 9 mg/dL (ref 8.4–10.4)
CO2: 22 mEq/L (ref 22–29)
Chloride: 111 mEq/L — ABNORMAL HIGH (ref 98–109)
Creatinine: 1.1 mg/dL (ref 0.6–1.1)
EGFR: 62 mL/min/{1.73_m2} — AB (ref >90)
GLUCOSE: 101 mg/dL (ref 70–140)
Potassium: 4.2 mEq/L (ref 3.5–5.1)
Sodium: 143 mEq/L (ref 136–145)
TOTAL PROTEIN: 6.6 g/dL (ref 6.4–8.3)

## 2015-12-15 LAB — CBC WITH DIFFERENTIAL/PLATELET
BASO%: 0.5 % (ref 0.0–2.0)
Basophils Absolute: 0.1 10*3/uL (ref 0.0–0.1)
EOS ABS: 0.1 10*3/uL (ref 0.0–0.5)
EOS%: 0.9 % (ref 0.0–7.0)
HCT: 32.3 % — ABNORMAL LOW (ref 34.8–46.6)
HEMOGLOBIN: 10.5 g/dL — AB (ref 11.6–15.9)
LYMPH%: 30.4 % (ref 14.0–49.7)
MCH: 31 pg (ref 25.1–34.0)
MCHC: 32.5 g/dL (ref 31.5–36.0)
MCV: 95.3 fL (ref 79.5–101.0)
MONO#: 0.9 10*3/uL (ref 0.1–0.9)
MONO%: 9.6 % (ref 0.0–14.0)
NEUT%: 58.6 % (ref 38.4–76.8)
NEUTROS ABS: 5.5 10*3/uL (ref 1.5–6.5)
Platelets: 261 10*3/uL (ref 145–400)
RBC: 3.39 10*6/uL — AB (ref 3.70–5.45)
RDW: 18.4 % — AB (ref 11.2–14.5)
WBC: 9.4 10*3/uL (ref 3.9–10.3)
lymph#: 2.8 10*3/uL (ref 0.9–3.3)

## 2015-12-15 MED ORDER — DOCETAXEL CHEMO INJECTION 160 MG/16ML
50.0000 mg/m2 | Freq: Once | INTRAVENOUS | Status: AC
  Administered 2015-12-15: 100 mg via INTRAVENOUS
  Filled 2015-12-15: qty 10

## 2015-12-15 MED ORDER — SODIUM CHLORIDE 0.9 % IV SOLN
Freq: Once | INTRAVENOUS | Status: AC
  Administered 2015-12-15: 12:00:00 via INTRAVENOUS

## 2015-12-15 MED ORDER — DIPHENHYDRAMINE HCL 25 MG PO CAPS
ORAL_CAPSULE | ORAL | Status: AC
  Filled 2015-12-15: qty 2

## 2015-12-15 MED ORDER — FOSAPREPITANT DIMEGLUMINE INJECTION 150 MG
Freq: Once | INTRAVENOUS | Status: AC
  Administered 2015-12-15: 12:00:00 via INTRAVENOUS
  Filled 2015-12-15: qty 5

## 2015-12-15 MED ORDER — DIPHENHYDRAMINE HCL 25 MG PO CAPS
50.0000 mg | ORAL_CAPSULE | Freq: Once | ORAL | Status: AC
  Administered 2015-12-15: 50 mg via ORAL

## 2015-12-15 MED ORDER — ACETAMINOPHEN 325 MG PO TABS
650.0000 mg | ORAL_TABLET | Freq: Once | ORAL | Status: AC
  Administered 2015-12-15: 650 mg via ORAL

## 2015-12-15 MED ORDER — SODIUM CHLORIDE 0.9% FLUSH
10.0000 mL | INTRAVENOUS | Status: DC | PRN
  Administered 2015-12-15: 10 mL
  Filled 2015-12-15: qty 10

## 2015-12-15 MED ORDER — PALONOSETRON HCL INJECTION 0.25 MG/5ML
0.2500 mg | Freq: Once | INTRAVENOUS | Status: AC
  Administered 2015-12-15: 0.25 mg via INTRAVENOUS

## 2015-12-15 MED ORDER — TRASTUZUMAB CHEMO 150 MG IV SOLR
6.0000 mg/kg | Freq: Once | INTRAVENOUS | Status: AC
  Administered 2015-12-15: 546 mg via INTRAVENOUS
  Filled 2015-12-15: qty 26

## 2015-12-15 MED ORDER — ACETAMINOPHEN 325 MG PO TABS
ORAL_TABLET | ORAL | Status: AC
  Filled 2015-12-15: qty 2

## 2015-12-15 MED ORDER — PEGFILGRASTIM 6 MG/0.6ML ~~LOC~~ PSKT
6.0000 mg | PREFILLED_SYRINGE | Freq: Once | SUBCUTANEOUS | Status: AC
  Administered 2015-12-15: 6 mg via SUBCUTANEOUS
  Filled 2015-12-15: qty 0.6

## 2015-12-15 MED ORDER — PALONOSETRON HCL INJECTION 0.25 MG/5ML
INTRAVENOUS | Status: AC
  Filled 2015-12-15: qty 5

## 2015-12-15 MED ORDER — HEPARIN SOD (PORK) LOCK FLUSH 100 UNIT/ML IV SOLN
500.0000 [IU] | Freq: Once | INTRAVENOUS | Status: AC | PRN
  Administered 2015-12-15: 500 [IU]
  Filled 2015-12-15: qty 5

## 2015-12-15 NOTE — Progress Notes (Signed)
Patient Care Team: Midge Minium, MD as PCP - General [name], MD as Consulting Physician (Cardiology) Marylynn Pearson, MD as Consulting Physician (Ophthalmology) Nicholas Lose, MD as Consulting Physician (Hematology and Oncology) Kyung Rudd, MD as Consulting Physician (Radiation Oncology) Fanny Skates, MD as Consulting Physician (General Surgery)  DIAGNOSIS: Breast cancer of lower-outer quadrant of right female breast [facility])   Staging form: Breast, AJCC 7th Edition   - Clinical stage from 07/13/2015: Stage IA (T1c, N0, M0) - Unsigned  SUMMARY OF ONCOLOGIC HISTORY:   Breast cancer of lower-outer quadrant of right female breast ([place])   07/06/2015 Initial Diagnosis    Right breast biopsy posterior: IDC ER 90%, PR 5%, Ki-67 60%, HER-2 positive ratio 1.42,copy #6.1 T1c N0 stage IA; Right breast biopsy inferior medial: High-grade DCIS with comedonecrosis; ER 100%, PR 90%; 5 mm calcs      07/27/2015 Procedure    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 She is tolerating chemotherapy fairly well. She reports no nausea vomiting. Her energy levels are excellent. Her taste is excellent and she is able to eat everything she wants. She is worried about gaining weight.  REVIEW OF SYSTEMS:   Constitutional: Denies fevers, chills or abnormal weight loss Eyes: Denies blurriness of vision Ears, nose, mouth, throat, and  face: Denies mucositis or sore throat Respiratory: Denies cough, dyspnea or wheezes Cardiovascular: Denies palpitation, chest discomfort Gastrointestinal:  Denies nausea, heartburn or change in bowel habits Skin: Denies abnormal skin rashes Lymphatics: Denies new lymphadenopathy or easy bruising Neurological:neuropathy in the legs is improved Behavioral/Psych: Mood is stable, no new changes  Extremities: No lower extremity edema Breast:  denies any pain or lumps or nodules in either breasts All other systems were reviewed with the patient and are negative.  I have reviewed the past medical history, past surgical history, social history and family history with the patient and they are unchanged from previous note.  ALLERGIES:  is allergic to contrast media [iodinated diagnostic agents]; dilaudid [hydromorphone hcl]; adhesive [tape]; codeine; and lactose intolerance (gi).  MEDICATIONS:  Current Outpatient Prescriptions  Medication Sig Dispense Refill  . albuterol (PROAIR HFA) 108 (90 BASE) MCG/ACT inhaler Inhale 2 puffs into the lungs every 6 (six) hours as needed for wheezing. 1 Inhaler 3  . ALPRAZolam (XANAX) 1 MG tablet Take 1 tablet (1 mg total) by mouth 2 (two) times daily as needed. for anxiety 60 tablet 3  . aspirin 81 MG tablet Take 81 mg by mouth daily.    Marland Kitchen atorvastatin (LIPITOR) 20 MG tablet TAKE 1 TABLET BY MOUTH EVERY DAY (Patient taking differently: TAKE 1 TABLET BY MOUTH EVERY EVENING) 30 tablet 6  . buPROPion (WELLBUTRIN XL) 300 MG 24 hr tablet TAKE 1 TABLET BY MOUTH EVERY DAY 30 tablet 3  . cephALEXin (KEFLEX) 500 MG capsule Take 1 capsule (500 mg total) by mouth 4 (four) times daily. [place]  capsule 0  . dexamethasone (DECADRON) 4 MG tablet Take 1 tablet (4 mg total) by mouth 2 (two) times daily. Start the day before Taxotere. Then again the day after chemo for 1 day. 30 tablet 0  . levothyroxine (SYNTHROID, LEVOTHROID) 75 MCG tablet Take 1 tablet (75 mcg total) by mouth daily.  30 tablet 6  . lidocaine-prilocaine (EMLA) cream Apply to affected area once 30 g 3  . LORazepam (ATIVAN) 0.5 MG tablet Take 1 tablet (0.5 mg total) by mouth at bedtime. (Patient taking differently: Take 0.5 mg by mouth at bedtime as needed for sleep. ) 30 tablet 0  . magic mouthwash w/lidocaine SOLN 33m Swish,Swallow, or spit 4 times a day as needed 240 mL 1  . meloxicam (MOBIC) 15 MG tablet Take 15 mg by mouth daily.     . nitroGLYCERIN (NITROSTAT) 0.4 MG SL tablet Place 1 tablet (0.4 mg total) under the tongue every 5 (five) minutes as needed. As needed for chest pain 30 tablet 3  . ondansetron (ZOFRAN) 8 MG tablet Take 1 tablet (8 mg total) by mouth 2 (two) times daily as needed for refractory nausea / vomiting. Start on day 3 after chemo. 30 tablet 1  . Polyethyl Glycol-Propyl Glycol (SYSTANE ULTRA) 0.4-0.3 % SOLN Place 1 drop into both eyes 3 (three) times daily as needed (for dry eyes).     . prochlorperazine (COMPAZINE) 10 MG tablet Take 1 tablet (10 mg total) by mouth every 6 (six) hours as needed (Nausea or vomiting). 90 tablet 0  . ranitidine (ZANTAC) 150 MG tablet Take 150 mg by mouth 2 (two) times daily as needed for heartburn. Reported on 10/05/2015    . traMADol (ULTRAM) 50 MG tablet Take 50 mg by mouth daily as needed for moderate pain. Reported on 10/05/2015  0   No current facility-administered medications for this visit.     PHYSICAL EXAMINATION: ECOG PERFORMANCE STATUS: 1 - Symptomatic but completely ambulatory  Vitals:   12/15/15 1046  BP: 127/83  Pulse: 90  Resp: 18  Temp: 97.6 F (36.4 C)   Filed Weights   12/15/15 1046  Weight: 186 lb 12.8 oz (84.7 kg)    GENERAL:alert, no distress and comfortable SKIN: skin color, texture, turgor are normal, no rashes or significant lesions EYES: normal, Conjunctiva are pink and non-injected, sclera clear OROPHARYNX:no exudate, no erythema and lips, buccal mucosa, and tongue normal  NECK: supple, thyroid normal size,  non-tender, without nodularity LYMPH:  no palpable lymphadenopathy in the cervical, axillary or inguinal LUNGS: clear to auscultation and percussion with normal breathing effort HEART: regular rate & rhythm and no murmurs and no lower extremity edema ABDOMEN:abdomen soft, non-tender and normal bowel sounds MUSCULOSKELETAL:no cyanosis of digits and no clubbing  NEURO: alert & oriented x 3 with fluent speech, neuropathy has improved EXTREMITIES: No lower extremity edema   LABORATORY DATA:  I have reviewed the data as listed   Chemistry      Component Value Date/Time   NA 143 12/15/2015 1037   K 4.2 12/15/2015 1037   CL 107 09/27/2015 0657   CO2 22 12/15/2015 1037   BUN 16.8 12/15/2015 1037   CREATININE 1.1 12/15/2015 1037      Component Value Date/Time   CALCIUM 9.0 12/15/2015 1037   ALKPHOS 95 12/15/2015 1037   AST 23 12/15/2015 1037   ALT 17 12/15/2015 1037   BILITOT 0.30 12/15/2015 1037       Lab Results  Component Value Date   WBC  9.4 12/15/2015   HGB 10.5 (L) 12/15/2015   HCT 32.3 (L) 12/15/2015   MCV 95.3 12/15/2015   PLT 261 12/15/2015   NEUTROABS 5.5 12/15/2015     ASSESSMENT & PLAN:  Breast cancer of lower-outer quadrant of right female breast ([place]) Right mastectomy 08/15/2015: IDC grade 2, 2.2 cm, with associated DCIS intermediate grade, separate focus high-grade DCIS, 0/4 lymph nodes negative, T2 N0 stage II a, ER 90%, PR 5%, HER-2 negative duration 1.42, Ki-67 60%  Treatment plan: 1. Adjuvant chemotherapy with [place] 2. Followed by adjuvant antiestrogen therapy with anastrozole 5 years No role of radiation since she had mastectomy. ----------------------------------------------------------------------------------------------------------------------- Current treatment: Cycle 5day 1 TCH Echocardiogram was reviewed 08/25/2015: EF 65-70%  Chemotherapy toxicities: 1. Syncope: Patient went to the emergency room and was discharged home, no further problems  since then 2. Diarrhea due to chemotherapy: Encouraged her to take Imodium and given written instructions on Imodium. She did not have diarrhea with cycle 2. 3. Nausea and vomitingdue to chemotherapy: In spite of adding Emend, patient had persistent nausea and vomiting that lasted24 hours. We reduced thedosage of Cycle 3 chemotherapy. 4. Decreased appetite 5. Insomnia 6. Alopecia 7. 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 If she continues to have worsening neuropathy then we may have to discontinue Taxotere completely.  Return to clinic with cycle 6.   No orders of the defined types were placed in this encounter.  The patient has a good understanding of the overall plan. she agrees with it. she will call with any problems that may develop before the next visit here.   Rulon Eisenmenger, MD 12/15/15

## 2015-12-15 NOTE — Patient Instructions (Signed)
[facility] Discharge Instructions for Patients Receiving Chemotherapy  Today you received the following chemotherapy agents:  Taxotere and Herceptin  To help prevent nausea and vomiting after your treatment, we encourage you to take your nausea medication as ordered per MD.   If you develop nausea and vomiting that is not controlled by your nausea medication, call the clinic.   BELOW ARE SYMPTOMS THAT SHOULD BE REPORTED IMMEDIATELY:  *FEVER GREATER THAN 100.5 F  *CHILLS WITH OR WITHOUT FEVER  NAUSEA AND VOMITING THAT IS NOT CONTROLLED WITH YOUR NAUSEA MEDICATION  *UNUSUAL SHORTNESS OF BREATH  *UNUSUAL BRUISING OR BLEEDING  TENDERNESS IN MOUTH AND THROAT WITH OR WITHOUT PRESENCE OF ULCERS  *URINARY PROBLEMS  *BOWEL PROBLEMS  UNUSUAL RASH Items with * indicate a potential emergency and should be followed up as soon as possible.  Feel free to call the clinic you have any questions or concerns. The clinic phone number is [phone].  Please show the [facility] at check-in to the Emergency Department and triage nurse.

## 2015-12-15 NOTE — Assessment & Plan Note (Signed)
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 Adjuvant chemotherapy with [place] 2. Followed by adjuvant antiestrogen therapy with anastrozole 5 years No role of radiation since she had mastectomy. ----------------------------------------------------------------------------------------------------------------------- Current treatment: Cycle 5day 1 TCH Echocardiogram was reviewed 08/25/2015: EF 65-70%  Chemotherapy toxicities: 1. Syncope: Patient went to the emergency room and was discharged home, no further problems since then 2. Diarrhea due to chemotherapy: Encouraged her to take Imodium and given written instructions on Imodium. She did not have diarrhea with cycle 2. 3. Nausea and vomitingdue to chemotherapy: In spite of adding Emend, patient had persistent nausea and vomiting that lasted24 hours. We reduced thedosage of Cycle 3 chemotherapy. 4. Decreased appetite 5. Insomnia 6. Alopecia 7. 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 If she continues to have worsening neuropathy then we may have to discontinue Taxotere completely.  Return to clinic with cycle 6.

## 2015-12-16 ENCOUNTER — Telehealth: Payer: Self-pay

## 2015-12-16 NOTE — Telephone Encounter (Signed)
Received VM from pt stating she was experiencing loose and discolored fingernails on her right hand.  Pt currently receiving taxane and had last treatment yesterday.  Called pt back to discuss.  I let patient know that unfortunately we see this often with taxanes and that it is something that will improve with time.  During phone call we discussed importance of preventing infection to these nail beds.  We also discussed pt soaking fingers in water and tea tree oil combination for about 20 minutes twice daily while experiencing these issues.  Pt verbalized understanding and all questions and concerns answered during call.  Pt encouraged to let us know if she develops any other changes or has any additional questions she wishes to discuss.

## 2015-12-21 ENCOUNTER — Telehealth: Payer: Self-pay | Admitting: *Deleted

## 2015-12-21 ENCOUNTER — Other Ambulatory Visit: Payer: Self-pay | Admitting: Hematology and Oncology

## 2015-12-21 ENCOUNTER — Other Ambulatory Visit: Payer: Self-pay | Admitting: Nurse Practitioner

## 2015-12-21 DIAGNOSIS — C50511 Malignant neoplasm of lower-outer quadrant of right female breast: Secondary | ICD-10-CM

## 2015-12-21 NOTE — Telephone Encounter (Signed)
"  I have issues with neuropathy to my hands and feet from chemotherapy.  I use Gabapentin but my right foot, I can move to turn or twist it but it feels numb or like it's dead.  My left foot is numb at the toes and edge of the foot.  They hurt worse at night and I cannot sleep.  My fingers and nails look bruised turning blue and white.  I press them and can hear squishing sound.  I wear gloves when I was dishes.  The blue color is in the finger too, not just the nail.  The right middle and index finger look like the nail could come off.  What do I need to do.  Return number [phone]."   Denies any open skin or cuticles and no drainage from nail beds.  Advised she keep hands clean, moisturized and free from water wearing gloves for dish care.  Will notify provider.

## 2015-12-21 NOTE — Telephone Encounter (Signed)
Called patient with provider orders.  Scheduling message sent for [facility] per Dr. Lindi Adie, to be seen by Selena Lesser 12-22-2015 for neuropathy and hand foot syndrome.   However pt. has traffic court at 8:30 am.  Requested 2:30 pm but she will call when finished in court to come in to be seen.

## 2015-12-21 NOTE — Telephone Encounter (Signed)
Please have her see Tracey Evans tomorrow Thanks

## 2015-12-22 ENCOUNTER — Ambulatory Visit (HOSPITAL_BASED_OUTPATIENT_CLINIC_OR_DEPARTMENT_OTHER): Payer: Medicare Other

## 2015-12-22 ENCOUNTER — Ambulatory Visit (HOSPITAL_BASED_OUTPATIENT_CLINIC_OR_DEPARTMENT_OTHER): Payer: Medicare Other | Admitting: Nurse Practitioner

## 2015-12-22 ENCOUNTER — Encounter: Payer: Self-pay | Admitting: Nurse Practitioner

## 2015-12-22 ENCOUNTER — Other Ambulatory Visit: Payer: Self-pay | Admitting: Nurse Practitioner

## 2015-12-22 VITALS — BP 146/87 | HR 101 | Temp 98.4°F | Resp 17 | Ht 65.0 in | Wt 184.1 lb

## 2015-12-22 DIAGNOSIS — T451X5A Adverse effect of antineoplastic and immunosuppressive drugs, initial encounter: Secondary | ICD-10-CM

## 2015-12-22 DIAGNOSIS — G620 Drug-induced polyneuropathy: Secondary | ICD-10-CM | POA: Diagnosis not present

## 2015-12-22 DIAGNOSIS — L03019 Cellulitis of unspecified finger: Secondary | ICD-10-CM

## 2015-12-22 DIAGNOSIS — C50511 Malignant neoplasm of lower-outer quadrant of right female breast: Secondary | ICD-10-CM

## 2015-12-22 DIAGNOSIS — L608 Other nail disorders: Secondary | ICD-10-CM

## 2015-12-22 DIAGNOSIS — D509 Iron deficiency anemia, unspecified: Secondary | ICD-10-CM

## 2015-12-22 LAB — COMPREHENSIVE METABOLIC PANEL
ALBUMIN: 3.7 g/dL (ref 3.5–5.0)
ALK PHOS: 131 U/L (ref 40–150)
ALT: 17 U/L (ref 0–55)
ANION GAP: 8 meq/L (ref 3–11)
AST: 19 U/L (ref 5–34)
BILIRUBIN TOTAL: 0.44 mg/dL (ref 0.20–1.20)
BUN: 7.3 mg/dL (ref 7.0–26.0)
CO2: 28 mEq/L (ref 22–29)
Calcium: 9.3 mg/dL (ref 8.4–10.4)
Chloride: 107 mEq/L (ref 98–109)
Creatinine: 0.9 mg/dL (ref 0.6–1.1)
EGFR: 78 mL/min/{1.73_m2} — AB (ref >90)
Glucose: 118 mg/dl (ref 70–140)
POTASSIUM: 3.6 meq/L (ref 3.5–5.1)
SODIUM: 143 meq/L (ref 136–145)
TOTAL PROTEIN: 6.6 g/dL (ref 6.4–8.3)

## 2015-12-22 LAB — CBC WITH DIFFERENTIAL/PLATELET
BASO%: 0.1 % (ref 0.0–2.0)
BASOS ABS: 0 10*3/uL (ref 0.0–0.1)
EOS ABS: 0.3 10*3/uL (ref 0.0–0.5)
EOS%: 1.2 % (ref 0.0–7.0)
HCT: 30.6 % — ABNORMAL LOW (ref 34.8–46.6)
HEMOGLOBIN: 10 g/dL — AB (ref 11.6–15.9)
LYMPH%: 10.6 % — AB (ref 14.0–49.7)
MCH: 31.4 pg (ref 25.1–34.0)
MCHC: 32.8 g/dL (ref 31.5–36.0)
MCV: 95.7 fL (ref 79.5–101.0)
MONO#: 1.7 10*3/uL — AB (ref 0.1–0.9)
MONO%: 7.1 % (ref 0.0–14.0)
NEUT%: 81 % — ABNORMAL HIGH (ref 38.4–76.8)
NEUTROS ABS: 19.5 10*3/uL — AB (ref 1.5–6.5)
PLATELETS: 293 10*3/uL (ref 145–400)
RBC: 3.2 10*6/uL — AB (ref 3.70–5.45)
RDW: 18.7 % — ABNORMAL HIGH (ref 11.2–14.5)
WBC: 24.1 10*3/uL — AB (ref 3.9–10.3)
lymph#: 2.6 10*3/uL (ref 0.9–3.3)

## 2015-12-22 LAB — MAGNESIUM: Magnesium: 1.4 mg/dl — CL (ref 1.5–2.5)

## 2015-12-22 MED ORDER — CEPHALEXIN 500 MG PO CAPS: 500.0000 mg | ORAL_CAPSULE | Freq: Four times a day (QID) | ORAL | 0 refills | Status: DC

## 2015-12-22 NOTE — Assessment & Plan Note (Addendum)
Patient has noticed some discoloration of all of her fingernail beds for the past week or so.  She has also noted that some of her nails have actually lifted off; and she has  both purulent drainage and a foul odor coming from some of the nails as well.  There is some mild tenderness surrounding these nails.  She denies any recent fevers or chills.    Exam today reveals discoloration to all fingernail beds.  Patient has some bloody discharge underneath the left index fingernail.  The right ring finger and middle finger nail beds appear lifted up and there is a small amount of purulent drainage noted.  Trace foul odor noted as well.  Was able to obtain a culture of this nail bed drainage.;  With culture results pending.  Reviewed all findings with Dr. Lindi Adie; and he advised to initiate Keflex antibiotics for treatment of any possible infection.  Also, patient was advised to soak her nailbeds with the baking soda and salt water soaks several times per day.  She should also cut her nails shorter to prevent any snagging.   Patient states that she has taken Keflex along locations with no difficulty; but states that the combination of taking Keflex and Flagyl caused her to have myalgias.  Patient was advised to call/return for any worsening symptoms whatsoever.

## 2015-12-22 NOTE — Assessment & Plan Note (Signed)
Patient received cycle 5 of her Taxotere/carboplatin/Herceptin chemotherapy regimen on 12/15/2015.  She is scheduled to return on 01/05/2016 for labs, visit, and chemotherapy.

## 2015-12-22 NOTE — Progress Notes (Signed)
SYMPTOM MANAGEMENT CLINIC    Chief Complaint: Neuropathy, nailbed infection  HPI:  Tracey Evans 70 y.o. female diagnosed with breast cancer.  Currently undergoing Taxotere/carboplatin/Herceptin chemotherapy regimen.     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  Neurological: Positive for tingling.  All other systems reviewed and are negative.   Past Medical History:  Diagnosis Date  . Angina at rest [facility])    "occurs whenever it wants to, but worse during agitation"  . Anxiety   . Asthma   . Binge eating disorder   . Breast cancer of lower-outer quadrant of left female breast ([place]) 07/08/2015   "NEVER HAD LEFT BREAST CANCER" (08/15/2015)  . Cancer of right breast ([place]) 08/15/2015  . Chronic bronchitis ([place])   . Chronic lower back pain   . Complication of anesthesia    had bronc spasms during intubation surgery 2009 on foot-need albuterol inhaler or neb tx preop  . Costochondritis   . Depression   . Diverticular disease   . Fibromyalgia   . GERD (gastroesophageal reflux disease)   . Hot flashes   . Hyperlipidemia   . Hypertension   .  Hypothyroidism   . Osteoarthritis    "all over"  . Peptic ulcer disease   . Shortness of breath dyspnea   . Type II diabetes mellitus ([place])    "diet controlled" (08/15/2015)    Past Surgical History:  Procedure Laterality Date  . ABDOMINAL HYSTERECTOMY     "left my ovaries"  . ANKLE ARTHROSCOPY  01/02/2012   Procedure: ANKLE ARTHROSCOPY;  Surgeon: Colin Rhein, MD;  Location: [place];  Service: Orthopedics;  Laterality: Right;  right ankle arthroscopy with extensive debridement , dridement and drilling talar dome osteochondral lesion  . ANKLE FRACTURE SURGERY Right 2009   Dr. Rolena Infante  . BREAST BIOPSY  07/2015  . CARDIAC CATHETERIZATION    . CARPAL TUNNEL RELEASE Bilateral   . COLONOSCOPY    . DILATION AND CURETTAGE OF UTERUS     "before hysterectomy"  . FRACTURE SURGERY    . KNEE ARTHROSCOPY Bilateral   . LAPAROSCOPIC CHOLECYSTECTOMY    . MASTECTOMY COMPLETE / SIMPLE W/ SENTINEL NODE BIOPSY Right 08/15/2015  . MASTECTOMY W/ SENTINEL NODE BIOPSY Right 08/15/2015   Procedure: TOTAL MASTECTOMY WITH SENTINEL LYMPH NODE BIOPSY AND BLUE DYE INJECTION ;  Surgeon: Fanny Skates, MD;  Location: [place];  Service: General;  Laterality: Right;  . PORTACATH PLACEMENT Left 08/15/2015  . PORTACATH PLACEMENT Left 08/15/2015   Procedure: INSERTION PORT-A-CATH ;  Surgeon: Fanny Skates,  MD;  Location: [place];  Service: General;  Laterality: Left;  . SHOULDER ARTHROSCOPY Bilateral 2009-2011   "bone spurs"  . TONSILLECTOMY      has Hypothyroidism; Hyperlipidemia associated with type 2 diabetes mellitus ([place]); DEPRESSION; Essential hypertension; GERD; PEPTIC ULCER DISEASE; OSTEOARTHRITIS; LOW BACK PAIN; ASTHMA; VERTIGO; STRESS INCONTINENCE; Polyarthralgia; Allergy to dogs; Colitis; DM w/o complication type II ([place]); OAB (overactive bladder); Chemotherapy-induced peripheral neuropathy ([place]); Edema; Depression; Grief; Obesity (BMI 30-39.9); Dry mouth; Dry eye; Dry skin; Diverticulitis of colon  (without mention of hemorrhage); UTI (urinary tract infection); Abdominal pain, unspecified site; Flu-like symptoms; Fibromyalgia; Disturbance in sleep behavior; Breast cancer of lower-outer quadrant of right female breast ([place]); Family history of breast cancer in female; Family history of colon cancer; Genetic testing; Syncope; and Infection of nail bed of finger on her problem list.    is allergic to contrast media [iodinated diagnostic agents]; dilaudid [hydromorphone hcl]; adhesive [tape]; codeine; and lactose intolerance (gi).    Medication List       Accurate as of 12/22/15  2:46 PM. Always use your most recent med list.          albuterol 108 (90 Base) MCG/ACT inhaler Commonly known as:  PROAIR HFA Inhale 2 puffs into the lungs every 6 (six) hours as needed for wheezing.   ALPRAZolam 1 MG tablet Commonly known as:  XANAX Take 1 tablet (1 mg total) by mouth 2 (two) times daily as needed. for anxiety   aspirin 81 MG tablet Take 81 mg by mouth daily.   atorvastatin 20 MG tablet Commonly known as:  LIPITOR TAKE 1 TABLET BY MOUTH EVERY DAY   buPROPion 300 MG 24 hr tablet Commonly known as:  WELLBUTRIN XL TAKE 1 TABLET BY MOUTH EVERY DAY   cephALEXin 500 MG capsule Commonly known as:  KEFLEX Take 1 capsule (500 mg total) by mouth 4 (four) times daily.   dexamethasone 4 MG tablet Commonly known as:  DECADRON Take 1 tablet (4 mg total) by mouth 2 (two) times daily. Start the day before Taxotere. Then again the day after chemo for 1 day.   levothyroxine 75 MCG tablet Commonly known as:  SYNTHROID, LEVOTHROID Take 1 tablet (75 mcg total) by mouth daily.   lidocaine-prilocaine cream Commonly known as:  EMLA Apply to affected area once   LORazepam 0.5 MG tablet Commonly known as:  ATIVAN Take 1 tablet (0.5 mg total) by mouth at bedtime.   magic mouthwash w/lidocaine Soln 80m Swish,Swallow, or spit 4 times a day as needed   meloxicam 15 MG tablet Commonly known as:   MOBIC Take 15 mg by mouth daily.   nitroGLYCERIN 0.4 MG SL tablet Commonly known as:  NITROSTAT Place 1 tablet (0.4 mg total) under the tongue every 5 (five) minutes as needed. As needed for chest pain   ondansetron 8 MG tablet Commonly known as:  ZOFRAN Take 1 tablet (8 mg total) by mouth 2 (two) times daily as needed for refractory nausea / vomiting. Start on day 3 after chemo.   prochlorperazine 10 MG tablet Commonly known as:  COMPAZINE Take 1 tablet (10 mg total) by mouth every 6 (six) hours as needed (Nausea or vomiting).   ranitidine 150 MG tablet Commonly known as:  ZANTAC Take 150 mg by mouth 2 (two) times daily as needed for heartburn. Reported on 10/05/2015   SYSTANE ULTRA 0.4-0.3 % Soln Generic drug:  Polyethyl Glycol-Propyl Glycol Place 1 drop into both eyes 3 (three) times daily as needed (  for dry eyes).   traMADol 50 MG tablet Commonly known as:  ULTRAM Take 50 mg by mouth daily as needed for moderate pain. Reported on 10/05/2015        PHYSICAL EXAMINATION  Oncology Vitals 12/22/2015 12/15/2015  Height 165 cm 165 cm  Weight 83.507 kg 84.732 kg  Weight (lbs) 184 lbs 2 oz 186 lbs 13 oz  BMI (kg/m2) 30.64 kg/m2 31.09 kg/m2  Temp 98.4 97.6  Pulse 101 90  Resp 17 18  SpO2 98 98  BSA (m2) 1.96 m2 1.97 m2   BP Readings from Last 2 Encounters:  12/22/15 (!) 146/87  12/15/15 127/83    Physical Exam  Constitutional: She is oriented to person, place, and time and well-developed, well-nourished, and in no distress.  HENT:  Head: Normocephalic and atraumatic.  Eyes: Conjunctivae and EOM are normal. Pupils are equal, round, and reactive to light.  Neck: Normal range of motion.  Pulmonary/Chest: Effort normal. No respiratory distress.  Musculoskeletal: Normal range of motion. She exhibits tenderness. She exhibits no edema.  Neurological: She is alert and oriented to person, place, and time. Gait normal.  Skin: Skin is warm and dry. No rash noted. No erythema.    All fingernail beds with some discoloration; with the left index finger noted to have blood underneath the nail bed.  There is some mild tenderness to the site.  The right ring and middle finger, nailbeds with increased discoloration and some purulent drainage noted.    Psychiatric: Affect normal.  Nursing note and vitals reviewed.   LABORATORY DATA:. Appointment on 12/22/2015  Component Date Value Ref Range Status  . WBC 12/22/2015 24.1* 3.9 - 10.3 10e3/uL Final  . NEUT# 12/22/2015 19.5* 1.5 - 6.5 10e3/uL Final  . HGB 12/22/2015 10.0* 11.6 - 15.9 g/dL Final  . HCT 12/22/2015 30.6* 34.8 - 46.6 % Final  . Platelets 12/22/2015 293  145 - 400 10e3/uL Final  . MCV 12/22/2015 95.7  79.5 - 101.0 fL Final  . MCH 12/22/2015 31.4  25.1 - 34.0 pg Final  . MCHC 12/22/2015 32.8  31.5 - 36.0 g/dL Final  . RBC 12/22/2015 3.20* 3.70 - 5.45 10e6/uL Final  . RDW 12/22/2015 18.7* 11.2 - 14.5 % Final  . lymph# 12/22/2015 2.6  0.9 - 3.3 10e3/uL Final  . MONO# 12/22/2015 1.7* 0.1 - 0.9 10e3/uL Final  . Eosinophils Absolute 12/22/2015 0.3  0.0 - 0.5 10e3/uL Final  . Basophils Absolute 12/22/2015 0.0  0.0 - 0.1 10e3/uL Final  . NEUT% 12/22/2015 81.0* 38.4 - 76.8 % Final  . LYMPH% 12/22/2015 10.6* 14.0 - 49.7 % Final  . MONO% 12/22/2015 7.1  0.0 - 14.0 % Final  . EOS% 12/22/2015 1.2  0.0 - 7.0 % Final  . BASO% 12/22/2015 0.1  0.0 - 2.0 % Final  . Sodium 12/22/2015 143  136 - 145 mEq/L Final  . Potassium 12/22/2015 3.6  3.5 - 5.1 mEq/L Final  . Chloride 12/22/2015 107  98 - 109 mEq/L Final  . CO2 12/22/2015 28  22 - 29 mEq/L Final  . Glucose 12/22/2015 118  70 - 140 mg/dl Final  . BUN 12/22/2015 7.3  7.0 - 26.0 mg/dL Final  . Creatinine 12/22/2015 0.9  0.6 - 1.1 mg/dL Final  . Total Bilirubin 12/22/2015 0.44  0.20 - 1.20 mg/dL Final  . Alkaline Phosphatase 12/22/2015 131  40 - 150 U/L Final  . AST 12/22/2015 19  5 - 34 U/L Final  . ALT 12/22/2015 17  0 -  55 U/L Final  . Total Protein 12/22/2015 6.6   6.4 - 8.3 g/dL Final  . Albumin 12/22/2015 3.7  3.5 - 5.0 g/dL Final  . Calcium 12/22/2015 9.3  8.4 - 10.4 mg/dL Final  . Anion Gap 12/22/2015 8  3 - 11 mEq/L Final  . EGFR 12/22/2015 78* >90 ml/min/1.73 m2 Final  . Magnesium 12/22/2015 1.4* 1.5 - 2.5 mg/dl Final       RADIOGRAPHIC STUDIES: No results found.  ASSESSMENT/PLAN:    Infection of nail bed of finger Patient has noticed some discoloration of all of her fingernail beds for the past week or so.  She has also noted that some of her nails have actually lifted off; and she has  both purulent drainage and a foul odor coming from some of the nails as well.  There is some mild tenderness surrounding these nails.  She denies any recent fevers or chills.    Exam today reveals discoloration to all fingernail beds.  Patient has some bloody discharge underneath the left index fingernail.  The right ring finger and middle finger nail beds appear lifted up and there is a small amount of purulent drainage noted.  Trace foul odor noted as well.  Was able to obtain a culture of this nail bed drainage.;  With culture results pending.  Reviewed all findings with Dr. Lindi Adie; and he advised to initiate Keflex antibiotics for treatment of any possible infection.  Also, patient was advised to soak her nailbeds with the baking soda and salt water soaks several times per day.  She should also cut her nails shorter to prevent any snagging.   Patient states that she has taken Keflex along locations with no difficulty; but states that the combination of taking Keflex and Flagyl caused her to have myalgias.  Patient was advised to call/return for any worsening symptoms whatsoever.    Chemotherapy-induced peripheral neuropathy [facility]) Patient states that she's been experiencing progressive/worsening neuropathy to all of her extremities recently.  Dr. Lindi Adie is aware of her continued neuropathy; has tried in the past to decrease her chemotherapy dosage; but the  patient feels that her neuropathy has actually greatly worsened within the past few days.  Patient states that she was taking Neurontin 3 times per day; but could tell no difference if she took it-so she has discontinued the Neurontin.  Labs obtained today were all within normal limits; with the exception of very mild low hypomagnesemia at 1.4.  Patient was encouraged to eat foods high in magnesium; and could also start taking a multivitamin as well.  Will continue to monitor.  Also, patient will have a B-12 level drawn with her next set of labs.  B-12 order has been placed.  Reviewed all findings with Dr. Lindi Adie today; he advised that he will most likely hold any further chemotherapy and just proceed with the Herceptin infusion at patient's next visit.  However, he stated that he would review all with the patient to see how she is doing prior to making that decision.  Breast cancer of lower-outer quadrant of right female breast [facility]) Patient received cycle 5 of her Taxotere/carboplatin/Herceptin chemotherapy regimen on 12/15/2015.  She is scheduled to return on 01/05/2016 for labs, visit, and chemotherapy.   Patient stated understanding of all instructions; and was in agreement with this plan of care. The patient knows to call the clinic with any problems, questions or concerns.   Total time spent with patient was 25 minutes;  with greater than 75  percent of that time spent in face to face counseling regarding patient's symptoms,  and coordination of care and follow up.  Disclaimer:This dictation was prepared with Dragon/digital dictation along with [employer]. Any transcriptional errors that result from this process are unintentional.  Drue Second, NP 12/22/2015

## 2015-12-22 NOTE — Telephone Encounter (Signed)
Patient called asking what time to come in for [facility] visit today as she has just left court.  Asked to come in at 10:45 am.  Message left for collaborative of [facility].

## 2015-12-22 NOTE — Assessment & Plan Note (Signed)
Patient states that she's been experiencing progressive/worsening neuropathy to all of her extremities recently.  Dr. Lindi Adie is aware of her continued neuropathy; has tried in the past to decrease her chemotherapy dosage; but the patient feels that her neuropathy has actually greatly worsened within the past few days.  Patient states that she was taking Neurontin 3 times per day; but could tell no difference if she took it-so she has discontinued the Neurontin.  Labs obtained today were all within normal limits; with the exception of very mild low hypomagnesemia at 1.4.  Patient was encouraged to eat foods high in magnesium; and could also start taking a multivitamin as well.  Will continue to monitor.  Also, patient will have a B-12 level drawn with her next set of labs.  B-12 order has been placed.  Reviewed all findings with Dr. Lindi Adie today; he advised that he will most likely hold any further chemotherapy and just proceed with the Herceptin infusion at patient's next visit.  However, he stated that he would review all with the patient to see how she is doing prior to making that decision.

## 2015-12-23 ENCOUNTER — Other Ambulatory Visit: Payer: Self-pay

## 2015-12-23 NOTE — Telephone Encounter (Signed)
Chart reviewed.

## 2015-12-27 ENCOUNTER — Telehealth: Payer: Self-pay | Admitting: Nurse Practitioner

## 2015-12-27 LAB — WOUND CULTURE

## 2015-12-27 NOTE — Telephone Encounter (Signed)
Called to check in with patient regarding her fingernail bed infection.  Patient was prescribed Keflex last week for treatment of the nail infection.  Wound culture from drainage from underneath her fingernails revealed both Pseudomonas and mild Streptococcus as well.  If patient's infection/nail discharge/infection continues-the plan would be to initiate Levaquin antibiotics for treatment of the Pseudomonas portion of the culture results.  Patient did not answer the phone; but a detailed message was left with the patient.  This provider will try to call the patient back later this afternoon or first thing in the morning.

## 2015-12-28 ENCOUNTER — Other Ambulatory Visit: Payer: Self-pay | Admitting: Nurse Practitioner

## 2015-12-28 ENCOUNTER — Telehealth: Payer: Self-pay | Admitting: Nurse Practitioner

## 2015-12-28 DIAGNOSIS — I2699 Other pulmonary embolism without acute cor pulmonale: Secondary | ICD-10-CM

## 2015-12-28 MED ORDER — LEVOFLOXACIN 500 MG PO TABS: 500.0000 mg | ORAL_TABLET | Freq: Every day | ORAL | 0 refills | Status: DC

## 2015-12-28 NOTE — Telephone Encounter (Signed)
Patient called this provider back regarding culture results.  Patient was seen last week at the [place] with complaint of discolored fingernail beds and purulent discharge coming from underneath 2 of the fingernails at that time.  Patient was initiated on Keflex antibiotics at that time for treatment.  Culture results revealed light growth of Streptococcus and some scant growth of Pseudomonas as well.  Reviewed all results with the cancer center pharmacist; and it was recommended that patient had Levaquin to her treatment regimen to cover the Pseudomonas aspect.  Also, patient states that her fingernails have mildly improved; but there is some trace drainage underneath one of her fingernails only.  She also states that her hands are slightly edematous; but basically stable.  She denies any recent fevers or chills.  Patient will continue with her fingernail beds soaks as previously directed.  She will also continue the Keflex antibiotics; and Levaquin as directed.  She was encouraged to call/return or go directly to the emergency department for any worsening symptoms whatsoever.

## 2016-01-02 ENCOUNTER — Telehealth: Payer: Self-pay | Admitting: *Deleted

## 2016-01-02 DIAGNOSIS — E039 Hypothyroidism, unspecified: Secondary | ICD-10-CM | POA: Diagnosis not present

## 2016-01-02 DIAGNOSIS — I10 Essential (primary) hypertension: Secondary | ICD-10-CM | POA: Diagnosis not present

## 2016-01-02 DIAGNOSIS — E785 Hyperlipidemia, unspecified: Secondary | ICD-10-CM | POA: Diagnosis not present

## 2016-01-02 DIAGNOSIS — E669 Obesity, unspecified: Secondary | ICD-10-CM | POA: Diagnosis not present

## 2016-01-02 DIAGNOSIS — I2510 Atherosclerotic heart disease of native coronary artery without angina pectoris: Secondary | ICD-10-CM | POA: Diagnosis not present

## 2016-01-02 DIAGNOSIS — M1990 Unspecified osteoarthritis, unspecified site: Secondary | ICD-10-CM | POA: Diagnosis not present

## 2016-01-02 DIAGNOSIS — M797 Fibromyalgia: Secondary | ICD-10-CM | POA: Diagnosis not present

## 2016-01-02 NOTE — Telephone Encounter (Signed)
Patient called back and left message, needs appts moved to next week. Next Thursday there is no available, moved to Friday.

## 2016-01-02 NOTE — Telephone Encounter (Signed)
Per staff message I have tried to reschedule appts form 9/28. I have called and left the patient a message to call the office, need a date to move too.

## 2016-01-02 NOTE — Telephone Encounter (Signed)
Patient canceled appts for this week, rescheduled to next week. Patietn can come Thursday or Friday. Infusion room capped for Thursday, moved to Friday. Sent message to both the MD and desk RN for MD appt on  10/6.

## 2016-01-05 ENCOUNTER — Ambulatory Visit: Payer: Medicare Other

## 2016-01-05 ENCOUNTER — Other Ambulatory Visit: Payer: Medicare Other

## 2016-01-05 ENCOUNTER — Ambulatory Visit: Payer: Medicare Other | Admitting: Hematology and Oncology

## 2016-01-05 NOTE — Assessment & Plan Note (Deleted)
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 Adjuvant chemotherapy with [place] 2. Followed by adjuvant antiestrogen therapy with anastrozole 5 years No role of radiation since she had mastectomy. ----------------------------------------------------------------------------------------------------------------------- Current treatment: Cycle 6day 1 TCH Echocardiogram was reviewed 08/25/2015: EF 65-70%  Chemotherapy toxicities: 1. Syncope: Patient went to the emergency room and was discharged home, no further problems since then 2. Diarrhea due to chemotherapy: Encouraged her to take Imodium and given written instructions on Imodium. She did not have diarrhea with cycle 2. 3. Nausea and vomitingdue to chemotherapy: In spite of adding [place], patient had persistent nausea and vomiting that lasted24 hours. We reduced thedosage of Cycle 3 chemotherapy. 4. Decreased appetite 5. Insomnia 6. Alopecia 7. 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 If she continues to have worsening neuropathy then we may have to discontinue Taxotere completely.  Anastrozole counseling: We discussed the risks and benefits of anti-estrogen therapy with aromatase inhibitors. These include but not limited to insomnia, hot flashes, mood changes, vaginal dryness, bone density loss, and weight gain. We strongly believe that the benefits far outweigh the risks. Patient understands these risks and consented to starting treatment. Planned treatment duration is 5-10 years. I encouraged her to start anastrozole in one month  Return to clinic every 3 weeks for Herceptin maintenance and every 6 weeks for clinic follow-up.

## 2016-01-12 DIAGNOSIS — M797 Fibromyalgia: Secondary | ICD-10-CM | POA: Diagnosis not present

## 2016-01-12 DIAGNOSIS — Z90710 Acquired absence of both cervix and uterus: Secondary | ICD-10-CM | POA: Diagnosis not present

## 2016-01-12 DIAGNOSIS — E785 Hyperlipidemia, unspecified: Secondary | ICD-10-CM | POA: Diagnosis not present

## 2016-01-12 DIAGNOSIS — N6489 Other specified disorders of breast: Secondary | ICD-10-CM | POA: Diagnosis not present

## 2016-01-12 DIAGNOSIS — Z803 Family history of malignant neoplasm of breast: Secondary | ICD-10-CM | POA: Diagnosis not present

## 2016-01-12 DIAGNOSIS — K219 Gastro-esophageal reflux disease without esophagitis: Secondary | ICD-10-CM | POA: Diagnosis not present

## 2016-01-12 DIAGNOSIS — I10 Essential (primary) hypertension: Secondary | ICD-10-CM | POA: Diagnosis not present

## 2016-01-12 DIAGNOSIS — C50111 Malignant neoplasm of central portion of right female breast: Secondary | ICD-10-CM | POA: Diagnosis not present

## 2016-01-13 ENCOUNTER — Other Ambulatory Visit (HOSPITAL_BASED_OUTPATIENT_CLINIC_OR_DEPARTMENT_OTHER): Payer: Medicare Other

## 2016-01-13 ENCOUNTER — Encounter: Payer: Self-pay | Admitting: Hematology and Oncology

## 2016-01-13 ENCOUNTER — Encounter: Payer: Self-pay | Admitting: *Deleted

## 2016-01-13 ENCOUNTER — Ambulatory Visit (HOSPITAL_BASED_OUTPATIENT_CLINIC_OR_DEPARTMENT_OTHER): Payer: Medicare Other | Admitting: Hematology and Oncology

## 2016-01-13 ENCOUNTER — Ambulatory Visit (HOSPITAL_BASED_OUTPATIENT_CLINIC_OR_DEPARTMENT_OTHER): Payer: Medicare Other

## 2016-01-13 DIAGNOSIS — D61810 Antineoplastic chemotherapy induced pancytopenia: Secondary | ICD-10-CM

## 2016-01-13 DIAGNOSIS — R112 Nausea with vomiting, unspecified: Secondary | ICD-10-CM

## 2016-01-13 DIAGNOSIS — T451X5A Adverse effect of antineoplastic and immunosuppressive drugs, initial encounter: Secondary | ICD-10-CM

## 2016-01-13 DIAGNOSIS — G620 Drug-induced polyneuropathy: Secondary | ICD-10-CM | POA: Diagnosis not present

## 2016-01-13 DIAGNOSIS — D539 Nutritional anemia, unspecified: Secondary | ICD-10-CM | POA: Diagnosis not present

## 2016-01-13 DIAGNOSIS — Z170 Estrogen receptor positive status [ER+]: Principal | ICD-10-CM

## 2016-01-13 DIAGNOSIS — C50511 Malignant neoplasm of lower-outer quadrant of right female breast: Secondary | ICD-10-CM

## 2016-01-13 DIAGNOSIS — L03019 Cellulitis of unspecified finger: Secondary | ICD-10-CM | POA: Diagnosis not present

## 2016-01-13 DIAGNOSIS — R197 Diarrhea, unspecified: Secondary | ICD-10-CM

## 2016-01-13 DIAGNOSIS — D509 Iron deficiency anemia, unspecified: Secondary | ICD-10-CM | POA: Diagnosis not present

## 2016-01-13 DIAGNOSIS — Z5112 Encounter for antineoplastic immunotherapy: Secondary | ICD-10-CM

## 2016-01-13 DIAGNOSIS — E785 Hyperlipidemia, unspecified: Secondary | ICD-10-CM | POA: Diagnosis not present

## 2016-01-13 LAB — CBC WITH DIFFERENTIAL/PLATELET
BASO%: 0.9 % (ref 0.0–2.0)
Basophils Absolute: 0.1 10*3/uL (ref 0.0–0.1)
EOS ABS: 0.5 10*3/uL (ref 0.0–0.5)
EOS%: 9.5 % — AB (ref 0.0–7.0)
HCT: 32.2 % — ABNORMAL LOW (ref 34.8–46.6)
HEMOGLOBIN: 10.5 g/dL — AB (ref 11.6–15.9)
LYMPH%: 32.9 % (ref 14.0–49.7)
MCH: 32.1 pg (ref 25.1–34.0)
MCHC: 32.6 g/dL (ref 31.5–36.0)
MCV: 98.3 fL (ref 79.5–101.0)
MONO#: 0.5 10*3/uL (ref 0.1–0.9)
MONO%: 8 % (ref 0.0–14.0)
NEUT%: 48.7 % (ref 38.4–76.8)
NEUTROS ABS: 2.8 10*3/uL (ref 1.5–6.5)
Platelets: 298 10*3/uL (ref 145–400)
RBC: 3.27 10*6/uL — AB (ref 3.70–5.45)
RDW: 17.1 % — AB (ref 11.2–14.5)
WBC: 5.7 10*3/uL (ref 3.9–10.3)
lymph#: 1.9 10*3/uL (ref 0.9–3.3)

## 2016-01-13 LAB — COMPREHENSIVE METABOLIC PANEL
ALBUMIN: 3.6 g/dL (ref 3.5–5.0)
ALT: <9 U/L (ref 0–55)
AST: 14 U/L (ref 5–34)
Alkaline Phosphatase: 103 U/L (ref 40–150)
Anion Gap: 10 mEq/L (ref 3–11)
BILIRUBIN TOTAL: 0.46 mg/dL (ref 0.20–1.20)
BUN: 10.6 mg/dL (ref 7.0–26.0)
CO2: 25 meq/L (ref 22–29)
Calcium: 9.2 mg/dL (ref 8.4–10.4)
Chloride: 109 mEq/L (ref 98–109)
Creatinine: 0.8 mg/dL (ref 0.6–1.1)
EGFR: 82 mL/min/{1.73_m2} — AB (ref >90)
GLUCOSE: 97 mg/dL (ref 70–140)
Potassium: 3.7 mEq/L (ref 3.5–5.1)
SODIUM: 144 meq/L (ref 136–145)
TOTAL PROTEIN: 6.1 g/dL — AB (ref 6.4–8.3)

## 2016-01-13 LAB — MAGNESIUM: Magnesium: 2 mg/dl (ref 1.5–2.5)

## 2016-01-13 MED ORDER — SODIUM CHLORIDE 0.9% FLUSH
10.0000 mL | INTRAVENOUS | Status: DC | PRN
  Administered 2016-01-13: 10 mL
  Filled 2016-01-13: qty 10

## 2016-01-13 MED ORDER — HEPARIN SOD (PORK) LOCK FLUSH 100 UNIT/ML IV SOLN
500.0000 [IU] | Freq: Once | INTRAVENOUS | Status: AC | PRN
  Administered 2016-01-13: 500 [IU]
  Filled 2016-01-13: qty 5

## 2016-01-13 MED ORDER — ANASTROZOLE 1 MG PO TABS: 1.0000 mg | ORAL_TABLET | Freq: Every day | ORAL | 3 refills | Status: DC

## 2016-01-13 MED ORDER — ACETAMINOPHEN 325 MG PO TABS
ORAL_TABLET | ORAL | Status: AC
  Filled 2016-01-13: qty 2

## 2016-01-13 MED ORDER — DIPHENHYDRAMINE HCL 25 MG PO CAPS
50.0000 mg | ORAL_CAPSULE | Freq: Once | ORAL | Status: AC
  Administered 2016-01-13: 50 mg via ORAL

## 2016-01-13 MED ORDER — SODIUM CHLORIDE 0.9 % IV SOLN
Freq: Once | INTRAVENOUS | Status: AC
  Administered 2016-01-13: 11:00:00 via INTRAVENOUS

## 2016-01-13 MED ORDER — TRASTUZUMAB CHEMO 150 MG IV SOLR
6.0000 mg/kg | Freq: Once | INTRAVENOUS | Status: AC
  Administered 2016-01-13: 546 mg via INTRAVENOUS
  Filled 2016-01-13: qty 26

## 2016-01-13 MED ORDER — DIPHENHYDRAMINE HCL 25 MG PO CAPS
ORAL_CAPSULE | ORAL | Status: AC
  Filled 2016-01-13: qty 1

## 2016-01-13 MED ORDER — ACETAMINOPHEN 325 MG PO TABS
650.0000 mg | ORAL_TABLET | Freq: Once | ORAL | Status: AC
  Administered 2016-01-13: 650 mg via ORAL

## 2016-01-13 NOTE — Progress Notes (Signed)
Patient Care Team: Midge Minium, MD as PCP - General [name], MD as Consulting Physician (Cardiology) Marylynn Pearson, MD as Consulting Physician (Ophthalmology) Nicholas Lose, MD as Consulting Physician (Hematology and Oncology) Kyung Rudd, MD as Consulting Physician (Radiation Oncology) Fanny Skates, MD as Consulting Physician (General Surgery)  DIAGNOSIS: Breast cancer of lower-outer quadrant of right female breast [facility])   Staging form: Breast, AJCC 7th Edition   - Clinical stage from 07/13/2015: Stage IA (T1c, N0, M0) - Unsigned  SUMMARY OF ONCOLOGIC HISTORY:   Breast cancer of lower-outer quadrant of right female breast ([place])   07/06/2015 Initial Diagnosis    Right breast biopsy posterior: IDC ER 90%, PR 5%, Ki-67 60%, HER-2 positive ratio 1.42,copy #6.1 T1c N0 stage IA; Right breast biopsy inferior medial: High-grade DCIS with comedonecrosis; ER 100%, PR 90%; 5 mm calcs      07/27/2015 Procedure    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, heterozygous      08/15/2015 Surgery    Right mastectomy: IDC grade 2, 2.2 cm, with associated DCIS intermediate grade, separate focus high-grade DCIS, 0/4 lymph nodes negative, T2 N0 stage II a, ER 90%, PR 5%, HER-2 negative duration 1.42, Ki-67 60%      09/22/2015 - 01/05/2016 Chemotherapy    Adjuvant chemotherapy with TCH 6 cycles followed by Herceptin maintenance for 1 year       CHIEF COMPLIANT: Cycle 6 TCH (Not giving cycle 6)  INTERVAL HISTORY: Tracey Evans is a 70 year old with above-mentioned history of right breast cancer treated with mastectomy and is currently on adjuvant chemotherapy. Today is the cycle 6 in the last cycle of chemotherapy. She has had multiple problems from chemotherapy previously including nausea and vomiting. Recently she has noticed discharge underneath the nails and required antibiotic treatments. She had neuropathy in her hands and feet and we  discontinue carboplatin. In spite of the lower dosage of Taxotere, she has noticed increased puffiness of the fingers and pain sensation.  REVIEW OF SYSTEMS:   Constitutional: Denies fevers, chills or abnormal weight loss Eyes: Denies blurriness of vision Ears, nose, mouth, throat, and face: Denies mucositis or sore throat Respiratory: Denies cough, dyspnea or wheezes Cardiovascular: Denies palpitation, chest discomfort Gastrointestinal:  Denies nausea, heartburn or change in bowel habits Skin: Denies abnormal skin rashes Lymphatics: Denies new lymphadenopathy or easy bruising Neurological: Peripheral neuropathy Behavioral/Psych: Mood is stable, no new changes  Extremities: No lower extremity edema, nails discharge underneath the nail bed  All other systems were reviewed with the patient and are negative.  I have reviewed the past medical history, past surgical history, social history and family history with the patient and they are unchanged from previous note.  ALLERGIES:  is allergic to contrast media [iodinated diagnostic agents]; dilaudid [hydromorphone hcl]; adhesive [tape]; codeine; and lactose intolerance (gi).  MEDICATIONS:  Current Outpatient Prescriptions  Medication Sig Dispense Refill  . albuterol (PROAIR HFA) 108 (90 BASE) MCG/ACT inhaler Inhale 2 puffs into the lungs every 6 (six) hours as needed for wheezing. 1 Inhaler 3  . ALPRAZolam (XANAX) 1 MG tablet Take 1 tablet (1 mg total) by mouth 2 (two) times daily as needed. for anxiety 60 tablet 3  . anastrozole (ARIMIDEX) 1 MG tablet Take 1 tablet (1 mg total) by mouth daily. 90 tablet 3  . aspirin 81 MG tablet Take 81 mg by mouth daily.    Marland Kitchen atorvastatin (LIPITOR) 20 MG tablet TAKE 1 TABLET BY MOUTH EVERY DAY (Patient  taking differently: TAKE 1 TABLET BY MOUTH EVERY EVENING) 30 tablet 6  . buPROPion (WELLBUTRIN XL) 300 MG 24 hr tablet TAKE 1 TABLET BY MOUTH EVERY DAY 30 tablet 3  . levothyroxine (SYNTHROID, LEVOTHROID) 75  MCG tablet Take 1 tablet (75 mcg total) by mouth daily. 30 tablet 6  . lidocaine-prilocaine (EMLA) cream Apply to affected area once 30 g 3  . magic mouthwash w/lidocaine SOLN 74m Swish,Swallow, or spit 4 times a day as needed 240 mL 1  . meloxicam (MOBIC) 15 MG tablet Take 15 mg by mouth daily.     . nitroGLYCERIN (NITROSTAT) 0.4 MG SL tablet Place 1 tablet (0.4 mg total) under the tongue every 5 (five) minutes as needed. As needed for chest pain (Patient not taking: Reported on 12/22/2015) 30 tablet 3  . Polyethyl Glycol-Propyl Glycol (SYSTANE ULTRA) 0.4-0.3 % SOLN Place 1 drop into both eyes 3 (three) times daily as needed (for dry eyes).     . prochlorperazine (COMPAZINE) 10 MG tablet Take 1 tablet (10 mg total) by mouth every 6 (six) hours as needed (Nausea or vomiting). 90 tablet 0  . ranitidine (ZANTAC) 150 MG tablet Take 150 mg by mouth 2 (two) times daily as needed for heartburn. Reported on 10/05/2015    . traMADol (ULTRAM) 50 MG tablet Take 50 mg by mouth daily as needed for moderate pain. Reported on 10/05/2015  0   No current facility-administered medications for this visit.    Facility-Administered Medications Ordered in Other Visits  Medication Dose Route Frequency Provider Last Rate Last Dose  . sodium chloride flush (NS) 0.9 % injection 10 mL  10 mL Intracatheter PRN VNicholas Lose MD   10 mL at 12/15/15 1439  . sodium chloride flush (NS) 0.9 % injection 10 mL  10 mL Intracatheter PRN VNicholas Lose MD   10 mL at 01/13/16 1206    PHYSICAL EXAMINATION: ECOG PERFORMANCE STATUS: 1 - Symptomatic but completely ambulatory  Vitals:   01/13/16 0919  BP: (!) 149/82  Pulse: 78  Resp: 18  Temp: 97.8 F (36.6 C)   Filed Weights   01/13/16 0919  Weight: 183 lb 3.2 oz (83.1 kg)    GENERAL:alert, no distress and comfortable SKIN: skin color, texture, turgor are normal, no rashes or significant lesions EYES: normal, Conjunctiva are pink and non-injected, sclera clear OROPHARYNX:no  exudate, no erythema and lips, buccal mucosa, and tongue normal  NECK: supple, thyroid normal size, non-tender, without nodularity LYMPH:  no palpable lymphadenopathy in the cervical, axillary or inguinal LUNGS: clear to auscultation and percussion with normal breathing effort HEART: regular rate & rhythm and no murmurs and no lower extremity edema ABDOMEN:abdomen soft, non-tender and normal bowel sounds MUSCULOSKELETAL:no cyanosis of digits and no clubbing  NEURO: alert & oriented x 3 with fluent speech, peripheral neuropathy grade 1-2 EXTREMITIES: No lower extremity edema   LABORATORY DATA:  I have reviewed the data as listed   Chemistry      Component Value Date/Time   NA 144 01/13/2016 0850   K 3.7 01/13/2016 0850   CL 107 09/27/2015 0657   CO2 25 01/13/2016 0850   BUN 10.6 01/13/2016 0850   CREATININE 0.8 01/13/2016 0850      Component Value Date/Time   CALCIUM 9.2 01/13/2016 0850   ALKPHOS 103 01/13/2016 0850   AST 14 01/13/2016 0850   ALT <9 01/13/2016 0850   BILITOT 0.46 01/13/2016 0850       Lab Results  Component Value Date  WBC 5.7 01/13/2016   HGB 10.5 (L) 01/13/2016   HCT 32.2 (L) 01/13/2016   MCV 98.3 01/13/2016   PLT 298 01/13/2016   NEUTROABS 2.8 01/13/2016     ASSESSMENT & PLAN:  Breast cancer of lower-outer quadrant of right female breast ([place]) Right mastectomy 08/15/2015: IDC grade 2, 2.2 cm, with associated DCIS intermediate grade, separate focus high-grade DCIS, 0/4 lymph nodes negative, T2 N0 stage II a, ER 90%, PR 5%, HER-2 negative duration 1.42, Ki-67 60%  Treatment plan: 1. Adjuvant chemotherapy with [place] 2. Followed by adjuvant antiestrogen therapy with anastrozole 5 years No role of radiation since she had mastectomy. ----------------------------------------------------------------------------------------------------------------------- Current treatment: Completed 5 cycles of TCH, chemotherapy being discontinued for neuropathy.  Patient will start Herceptin maintenance Echocardiogram was reviewed 08/25/2015: EF 65-70%  Chemotherapy toxicities: 1. Syncope: Patient went to the emergency room and was discharged home, no further problems since then 2. Diarrhea due to chemotherapy: Encouraged her to take Imodium and given written instructions on Imodium. She did not have diarrhea with cycle 2. 3. Nausea and vomitingdue to chemotherapy: In spite of adding Emend, patient had persistent nausea and vomiting that lasted24 hours. We reduced thedosage of Cycle 3 chemotherapy. 4. Decreased appetite 5. Insomnia 6. Alopecia 7. 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 If she continues to have worsening neuropathy then we may have to discontinue Taxotere completely. 8. Discharge under the nails with cycle 5 chemotherapy, received antibiotics.  Treatment plan: This will complete her chemotherapy. She will continue with Herceptin maintenance every 3 weeks. She will start antiestrogen therapy with anastrozole 1 mg by mouth daily in approximately one month.   Anastrozole counseling:We discussed the risks and benefits of anti-estrogen therapy with aromatase inhibitors. These include but not limited to insomnia, hot flashes, mood changes, vaginal dryness, bone density loss, and weight gain. We strongly believe that the benefits far outweigh the risks. Patient understands these risks and consented to starting treatment. Planned treatment duration is 5 years.  Return to clinic in 6 weeks for follow-up   No orders of the defined types were placed in this encounter.  The patient has a good understanding of the overall plan. she agrees with it. she will call with any problems that may develop before the next visit here.   Rulon Eisenmenger, MD 01/13/16

## 2016-01-13 NOTE — Patient Instructions (Signed)
[facility] Discharge Instructions for Patients Receiving Chemotherapy  Today you received the following chemotherapy agents:  Herceptin  To help prevent nausea and vomiting after your treatment, we encourage you to take your nausea medication as prescribed.   If you develop nausea and vomiting that is not controlled by your nausea medication, call the clinic.   BELOW ARE SYMPTOMS THAT SHOULD BE REPORTED IMMEDIATELY:  *FEVER GREATER THAN 100.5 F  *CHILLS WITH OR WITHOUT FEVER  NAUSEA AND VOMITING THAT IS NOT CONTROLLED WITH YOUR NAUSEA MEDICATION  *UNUSUAL SHORTNESS OF BREATH  *UNUSUAL BRUISING OR BLEEDING  TENDERNESS IN MOUTH AND THROAT WITH OR WITHOUT PRESENCE OF ULCERS  *URINARY PROBLEMS  *BOWEL PROBLEMS  UNUSUAL RASH Items with * indicate a potential emergency and should be followed up as soon as possible.  Feel free to call the clinic you have any questions or concerns. The clinic phone number is [phone].  Please show the CHEMO ALERT CARD at check-in to the Emergency Department and triage nurse.   

## 2016-01-13 NOTE — Assessment & Plan Note (Signed)
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 Adjuvant chemotherapy with [place] 2. Followed by adjuvant antiestrogen therapy with anastrozole 5 years No role of radiation since she had mastectomy. ----------------------------------------------------------------------------------------------------------------------- Current treatment: Cycle 5day 1 TCH Echocardiogram was reviewed 08/25/2015: EF 65-70%  Chemotherapy toxicities: 1. Syncope: Patient went to the emergency room and was discharged home, no further problems since then 2. Diarrhea due to chemotherapy: Encouraged her to take Imodium and given written instructions on Imodium. She did not have diarrhea with cycle 2. 3. Nausea and vomitingdue to chemotherapy: In spite of adding Emend, patient had persistent nausea and vomiting that lasted24 hours. We reduced thedosage of Cycle 3 chemotherapy. 4. Decreased appetite 5. Insomnia 6. Alopecia 7. 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 If she continues to have worsening neuropathy then we may have to discontinue Taxotere completely. 8. Discharge under the nails with cycle 5 chemotherapy, received antibiotics.  This will complete her chemotherapy. She will start antiestrogen therapy with anastrozole 1 mg by mouth daily in approximately one month. I would like to see her back month later to assess tolerability to anastrozole.  Anastrozole counseling:We discussed the risks and benefits of anti-estrogen therapy with aromatase inhibitors. These include but not limited to insomnia, hot flashes, mood changes, vaginal dryness, bone density loss, and weight gain. We strongly believe that the benefits far outweigh  the risks. Patient understands these risks and consented to starting treatment. Planned treatment duration is 5 years.  Return to clinic in 2 months for follow-up

## 2016-01-14 LAB — VITAMIN B12

## 2016-01-26 ENCOUNTER — Ambulatory Visit: Payer: Medicare Other

## 2016-01-26 ENCOUNTER — Other Ambulatory Visit: Payer: Medicare Other

## 2016-01-26 ENCOUNTER — Ambulatory Visit: Payer: Medicare Other | Admitting: Hematology and Oncology

## 2016-01-30 DIAGNOSIS — H43813 Vitreous degeneration, bilateral: Secondary | ICD-10-CM | POA: Diagnosis not present

## 2016-01-30 DIAGNOSIS — H25813 Combined forms of age-related cataract, bilateral: Secondary | ICD-10-CM | POA: Diagnosis not present

## 2016-01-30 DIAGNOSIS — H04123 Dry eye syndrome of bilateral lacrimal glands: Secondary | ICD-10-CM | POA: Diagnosis not present

## 2016-01-31 ENCOUNTER — Telehealth: Payer: Self-pay | Admitting: *Deleted

## 2016-01-31 NOTE — Telephone Encounter (Signed)
Voicemail: "I'm having serious Neuropathy issues in feet and hands.  I also have two fingers that have totally come off hanging on by the cuticles.  What do I do to fix this?  [phone]."

## 2016-01-31 NOTE — Telephone Encounter (Signed)
Called pt back to discuss issues she has been having in more detail.  Pt reports her neuropathy has not improved at all and she has 2 fingers with fingernails that are barely hanging on.  I informed pt that unfortunately it can take 3-6 months for pt to experience relief of these side effects.  I discussed with her starting gabapentin regimen; however, pt states she has done this in the past with no relief.  I explained to her I would discuss this with Dr. Lindi Adie and let her know any additional recommendations he had for her.  In regards to the fingernail issues, I again explained that this was unfortunately something often experienced by patients and that the nails would more than likely fall off and then grow back.  Our immediate concern is the prevention of infection in this area especially, which we discussed. Pt without further questions or concerns at time of call and verbalized understanding to let us know if she has anything further she wishes to discuss in the future.

## 2016-02-03 ENCOUNTER — Ambulatory Visit (HOSPITAL_BASED_OUTPATIENT_CLINIC_OR_DEPARTMENT_OTHER): Payer: Medicare Other

## 2016-02-03 VITALS — BP 147/86 | HR 73 | Temp 98.0°F | Resp 18

## 2016-02-03 DIAGNOSIS — C50511 Malignant neoplasm of lower-outer quadrant of right female breast: Secondary | ICD-10-CM

## 2016-02-03 DIAGNOSIS — Z5112 Encounter for antineoplastic immunotherapy: Secondary | ICD-10-CM

## 2016-02-03 DIAGNOSIS — Z170 Estrogen receptor positive status [ER+]: Principal | ICD-10-CM

## 2016-02-03 MED ORDER — DIPHENHYDRAMINE HCL 25 MG PO CAPS
50.0000 mg | ORAL_CAPSULE | Freq: Once | ORAL | Status: AC
  Administered 2016-02-03: 50 mg via ORAL

## 2016-02-03 MED ORDER — HEPARIN SOD (PORK) LOCK FLUSH 100 UNIT/ML IV SOLN
500.0000 [IU] | Freq: Once | INTRAVENOUS | Status: AC | PRN
  Administered 2016-02-03: 500 [IU]
  Filled 2016-02-03: qty 5

## 2016-02-03 MED ORDER — SODIUM CHLORIDE 0.9% FLUSH
10.0000 mL | INTRAVENOUS | Status: DC | PRN
  Administered 2016-02-03: 10 mL
  Filled 2016-02-03: qty 10

## 2016-02-03 MED ORDER — ACETAMINOPHEN 325 MG PO TABS
650.0000 mg | ORAL_TABLET | Freq: Once | ORAL | Status: AC
  Administered 2016-02-03: 650 mg via ORAL

## 2016-02-03 MED ORDER — ACETAMINOPHEN 325 MG PO TABS
ORAL_TABLET | ORAL | Status: AC
  Filled 2016-02-03: qty 2

## 2016-02-03 MED ORDER — TRASTUZUMAB CHEMO 150 MG IV SOLR
6.0000 mg/kg | Freq: Once | INTRAVENOUS | Status: AC
  Administered 2016-02-03: 546 mg via INTRAVENOUS
  Filled 2016-02-03: qty 26

## 2016-02-03 MED ORDER — DIPHENHYDRAMINE HCL 25 MG PO CAPS
ORAL_CAPSULE | ORAL | Status: AC
  Filled 2016-02-03: qty 2

## 2016-02-03 MED ORDER — SODIUM CHLORIDE 0.9 % IV SOLN
Freq: Once | INTRAVENOUS | Status: AC
  Administered 2016-02-03: 09:00:00 via INTRAVENOUS

## 2016-02-03 NOTE — Patient Instructions (Signed)
[facility] Discharge Instructions for Patients  Today you received the following: Herceptin   To help prevent nausea and vomiting after your treatment, we encourage you to take your nausea medication as directed.   If you develop nausea and vomiting that is not controlled by your nausea medication, call the clinic.   BELOW ARE SYMPTOMS THAT SHOULD BE REPORTED IMMEDIATELY:  *FEVER GREATER THAN 100.5 F  *CHILLS WITH OR WITHOUT FEVER  NAUSEA AND VOMITING THAT IS NOT CONTROLLED WITH YOUR NAUSEA MEDICATION  *UNUSUAL SHORTNESS OF BREATH  *UNUSUAL BRUISING OR BLEEDING  TENDERNESS IN MOUTH AND THROAT WITH OR WITHOUT PRESENCE OF ULCERS  *URINARY PROBLEMS  *BOWEL PROBLEMS  UNUSUAL RASH Items with * indicate a potential emergency and should be followed up as soon as possible.  Feel free to call the clinic you have any questions or concerns. The clinic phone number is [phone].  Please show the CHEMO ALERT CARD at check-in to the Emergency Department and triage nurse.   

## 2016-02-09 DIAGNOSIS — M47816 Spondylosis without myelopathy or radiculopathy, lumbar region: Secondary | ICD-10-CM | POA: Diagnosis not present

## 2016-02-09 DIAGNOSIS — M50821 Other cervical disc disorders at C4-C5 level: Secondary | ICD-10-CM | POA: Diagnosis not present

## 2016-02-09 DIAGNOSIS — Z23 Encounter for immunization: Secondary | ICD-10-CM | POA: Diagnosis not present

## 2016-02-09 DIAGNOSIS — M797 Fibromyalgia: Secondary | ICD-10-CM | POA: Diagnosis not present

## 2016-02-09 DIAGNOSIS — M546 Pain in thoracic spine: Secondary | ICD-10-CM | POA: Diagnosis not present

## 2016-02-14 ENCOUNTER — Telehealth: Payer: Self-pay | Admitting: *Deleted

## 2016-02-14 ENCOUNTER — Other Ambulatory Visit: Payer: Self-pay | Admitting: *Deleted

## 2016-02-14 ENCOUNTER — Encounter: Payer: Self-pay | Admitting: Nurse Practitioner

## 2016-02-14 ENCOUNTER — Ambulatory Visit (HOSPITAL_BASED_OUTPATIENT_CLINIC_OR_DEPARTMENT_OTHER): Payer: Medicare Other

## 2016-02-14 ENCOUNTER — Ambulatory Visit (HOSPITAL_BASED_OUTPATIENT_CLINIC_OR_DEPARTMENT_OTHER): Payer: Medicare Other | Admitting: Nurse Practitioner

## 2016-02-14 VITALS — BP 156/99 | HR 83 | Temp 97.8°F | Resp 18 | Ht 65.0 in | Wt 180.9 lb

## 2016-02-14 DIAGNOSIS — C50511 Malignant neoplasm of lower-outer quadrant of right female breast: Secondary | ICD-10-CM

## 2016-02-14 DIAGNOSIS — Z170 Estrogen receptor positive status [ER+]: Secondary | ICD-10-CM | POA: Diagnosis not present

## 2016-02-14 DIAGNOSIS — L03019 Cellulitis of unspecified finger: Secondary | ICD-10-CM

## 2016-02-14 DIAGNOSIS — B029 Zoster without complications: Secondary | ICD-10-CM | POA: Diagnosis not present

## 2016-02-14 LAB — CBC WITH DIFFERENTIAL/PLATELET
BASO%: 0.3 % (ref 0.0–2.0)
BASOS ABS: 0 10*3/uL (ref 0.0–0.1)
EOS ABS: 0.3 10*3/uL (ref 0.0–0.5)
EOS%: 3.3 % (ref 0.0–7.0)
HCT: 36.7 % (ref 34.8–46.6)
HEMOGLOBIN: 12 g/dL (ref 11.6–15.9)
LYMPH%: 22.5 % (ref 14.0–49.7)
MCH: 31.5 pg (ref 25.1–34.0)
MCHC: 32.7 g/dL (ref 31.5–36.0)
MCV: 96.3 fL (ref 79.5–101.0)
MONO#: 0.5 10*3/uL (ref 0.1–0.9)
MONO%: 6.4 % (ref 0.0–14.0)
NEUT%: 67.5 % (ref 38.4–76.8)
NEUTROS ABS: 5 10*3/uL (ref 1.5–6.5)
PLATELETS: 295 10*3/uL (ref 145–400)
RBC: 3.81 10*6/uL (ref 3.70–5.45)
RDW: 13.3 % (ref 11.2–14.5)
WBC: 7.5 10*3/uL (ref 3.9–10.3)
lymph#: 1.7 10*3/uL (ref 0.9–3.3)

## 2016-02-14 LAB — COMPREHENSIVE METABOLIC PANEL
ALBUMIN: 3.6 g/dL (ref 3.5–5.0)
ALK PHOS: 126 U/L (ref 40–150)
ALT: 42 U/L (ref 0–55)
ANION GAP: 8 meq/L (ref 3–11)
AST: 30 U/L (ref 5–34)
BILIRUBIN TOTAL: 0.3 mg/dL (ref 0.20–1.20)
BUN: 18.4 mg/dL (ref 7.0–26.0)
CO2: 30 mEq/L — ABNORMAL HIGH (ref 22–29)
Calcium: 9 mg/dL (ref 8.4–10.4)
Chloride: 105 mEq/L (ref 98–109)
Creatinine: 0.9 mg/dL (ref 0.6–1.1)
EGFR: 73 mL/min/{1.73_m2} — AB (ref >90)
Glucose: 115 mg/dl (ref 70–140)
POTASSIUM: 3.6 meq/L (ref 3.5–5.1)
Sodium: 143 mEq/L (ref 136–145)
TOTAL PROTEIN: 6.5 g/dL (ref 6.4–8.3)

## 2016-02-14 LAB — MAGNESIUM: MAGNESIUM: 1.6 mg/dL (ref 1.5–2.5)

## 2016-02-14 MED ORDER — VALACYCLOVIR HCL 1 G PO TABS: 1000.0000 mg | ORAL_TABLET | Freq: Three times a day (TID) | ORAL | 0 refills | Status: DC

## 2016-02-14 NOTE — Assessment & Plan Note (Addendum)
Patient completed her last chemotherapy on 12/15/2015.  She has been treated in the past for anger nail infection with antibiotics; and states that her fingernail infection has essentially resolved.  However, patient continues with discoloration to her nailbeds; and has actually lost 2 of her fingernails completely.  She has Band-Aids over these fingernails at this time.  Hopefully, patient's nails will continue to improve since she has completed all of her chemotherapy now.  Advised patient that she may very well lose more for nails secondary to her previous chemotherapy however.

## 2016-02-14 NOTE — Telephone Encounter (Signed)
Call transfer from [facility]) with [place] with patient.  "Woke up this morning with right eye swollen shut and a big lump to  The lower, back, center of her head with two red blisters in center at hairline.  Head itches.  Neither were present when I went to bed last night."  Checked temperature now, T = 97.0.  denies pain except "while shaking thermometer makes my head hurt".  Return number [phone] at home or mobile: [phone].

## 2016-02-14 NOTE — Progress Notes (Signed)
SYMPTOM MANAGEMENT CLINIC    Chief Complaint: Shingles  HPI:  Tracey Evans 70 y.o. female diagnosed with breast cancer.  Currently undergoing Herceptin infusions only.     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       Rash to scalp and edema to right periorbital region.  Also, fingernail changes as well.  All other systems reviewed and are negative.   Past Medical History:  Diagnosis Date  . Angina at rest [facility])    "occurs whenever it wants to, but worse during agitation"  . Anxiety   . Asthma   . Binge eating disorder   . Breast cancer of lower-outer quadrant of left female breast ([place]) 07/08/2015   "NEVER HAD LEFT BREAST CANCER" (08/15/2015)  . Cancer of right breast ([place]) 08/15/2015  . Chronic bronchitis ([place])   . Chronic lower back pain   . Complication of anesthesia    had bronc spasms during intubation surgery 2009 on foot-need albuterol inhaler or neb tx preop  . Costochondritis   . Depression   . Diverticular disease   . Fibromyalgia   . GERD (gastroesophageal reflux disease)   . Hot flashes   . Hyperlipidemia   . Hypertension   .  Hypothyroidism   . Osteoarthritis    "all over"  . Peptic ulcer disease   . Shortness of breath dyspnea   . Type II diabetes mellitus ([place])    "diet controlled" (08/15/2015)    Past Surgical History:  Procedure Laterality Date  . ABDOMINAL HYSTERECTOMY     "left my ovaries"  . ANKLE ARTHROSCOPY  01/02/2012   Procedure: ANKLE ARTHROSCOPY;  Surgeon: Colin Rhein, MD;  Location: [facility];  Service: Orthopedics;  Laterality: Right;  right ankle arthroscopy with extensive debridement , dridement and drilling talar dome osteochondral lesion  . ANKLE FRACTURE SURGERY Right 2009   Dr. Rolena Infante  . BREAST BIOPSY  07/2015  . CARDIAC CATHETERIZATION    . CARPAL TUNNEL RELEASE Bilateral   . COLONOSCOPY    . DILATION AND CURETTAGE OF UTERUS     "before hysterectomy"  . FRACTURE SURGERY    . KNEE ARTHROSCOPY Bilateral   . LAPAROSCOPIC CHOLECYSTECTOMY    . MASTECTOMY COMPLETE / SIMPLE W/ SENTINEL NODE BIOPSY Right 08/15/2015  . MASTECTOMY W/ SENTINEL NODE BIOPSY Right 08/15/2015   Procedure: TOTAL MASTECTOMY WITH SENTINEL LYMPH NODE BIOPSY AND BLUE DYE INJECTION ;  Surgeon: Fanny Skates, MD;  Location: [facility];  Service: General;  Laterality: Right;  . PORTACATH PLACEMENT Left 08/15/2015  . PORTACATH PLACEMENT Left 08/15/2015  Procedure: INSERTION PORT-A-CATH ;  Surgeon: Fanny Skates, MD;  Location: [facility];  Service: General;  Laterality: Left;  . SHOULDER ARTHROSCOPY Bilateral 2009-2011   "bone spurs"  . TONSILLECTOMY      has Hypothyroidism; Hyperlipidemia associated with type 2 diabetes mellitus ([place]); DEPRESSION; Essential hypertension; GERD; PEPTIC ULCER DISEASE; OSTEOARTHRITIS; LOW BACK PAIN; ASTHMA; VERTIGO; STRESS INCONTINENCE; Polyarthralgia; Allergy to dogs; Colitis; DM w/o complication type II ([place]); OAB (overactive bladder); Chemotherapy-induced peripheral neuropathy ([place]); Edema; Depression; Grief; Obesity (BMI 30-39.9); Dry mouth; Dry eye; Dry skin; Diverticulitis of colon  (without mention of hemorrhage)(562.11); UTI (urinary tract infection); Abdominal pain, unspecified site; Flu-like symptoms; Fibromyalgia; Disturbance in sleep behavior; Breast cancer of lower-outer quadrant of right female breast ([place]); Family history of breast cancer in female; Family history of colon cancer; Genetic testing; Syncope; Infection of nail bed of finger; Anemia associated with nutritional deficiency; and Shingles on her problem list.    is allergic to contrast media [iodinated diagnostic agents]; dilaudid [hydromorphone hcl]; adhesive [tape]; codeine; and lactose intolerance (gi).    Medication List       Accurate as of 02/14/16  2:47 PM. Always use your most recent med list.          albuterol 108 (90 Base) MCG/ACT inhaler Commonly known as:  PROAIR HFA Inhale 2 puffs into the lungs every 6 (six) hours as needed for wheezing.   ALPRAZolam 1 MG tablet Commonly known as:  XANAX Take 1 tablet (1 mg total) by mouth 2 (two) times daily as needed. for anxiety   anastrozole 1 MG tablet Commonly known as:  ARIMIDEX Take 1 tablet (1 mg total) by mouth daily.   aspirin 81 MG tablet Take 81 mg by mouth daily.   atorvastatin 20 MG tablet Commonly known as:  LIPITOR TAKE 1 TABLET BY MOUTH EVERY DAY   buPROPion 300 MG 24 hr tablet Commonly known as:  WELLBUTRIN XL TAKE 1 TABLET BY MOUTH EVERY DAY   levothyroxine 75 MCG tablet Commonly known as:  SYNTHROID, LEVOTHROID Take 1 tablet (75 mcg total) by mouth daily.   lidocaine-prilocaine cream Commonly known as:  EMLA Apply to affected area once   magic mouthwash w/lidocaine Soln 74m Swish,Swallow, or spit 4 times a day as needed   meloxicam 15 MG tablet Commonly known as:  MOBIC Take 15 mg by mouth daily.   nitroGLYCERIN 0.4 MG SL tablet Commonly known as:  NITROSTAT Place 1 tablet (0.4 mg total) under the tongue every 5 (five) minutes as needed. As needed for chest pain   prochlorperazine 10 MG tablet Commonly  known as:  COMPAZINE Take 1 tablet (10 mg total) by mouth every 6 (six) hours as needed (Nausea or vomiting).   ranitidine 150 MG tablet Commonly known as:  ZANTAC Take 150 mg by mouth 2 (two) times daily as needed for heartburn. Reported on 10/05/2015   SYSTANE ULTRA 0.4-0.3 % Soln Generic drug:  Polyethyl Glycol-Propyl Glycol Place 1 drop into both eyes 3 (three) times daily as needed (for dry eyes).   traMADol 50 MG tablet Commonly known as:  ULTRAM Take 50 mg by mouth daily as needed for moderate pain. Reported on 10/05/2015   valACYclovir 1000 MG tablet Commonly known as:  VALTREX Take 1 tablet (1,000 mg total) by mouth 3 (three) times daily.        PHYSICAL EXAMINATION  Oncology Vitals 02/14/2016 02/03/2016  Height 165 cm -  Weight 82.056 kg -  Weight (lbs) 180 lbs 14 oz -  BMI (kg/m2) 30.1 kg/m2 -  Temp 97.8 98  Pulse 83 73  Resp 18 18  SpO2 100 100  BSA (m2) 1.94 m2 -   BP Readings from Last 2 Encounters:  02/14/16 (!) 156/99  02/03/16 (!) 147/86    Physical Exam  Constitutional: She is oriented to person, place, and time and well-developed, well-nourished, and in no distress.  HENT:  Head: Normocephalic and atraumatic.  Mouth/Throat: Oropharynx is clear and moist.  See skin note.   Eyes: Conjunctivae and EOM are normal. Pupils are equal, round, and reactive to light. Right eye exhibits no discharge. Left eye exhibits no discharge. No scleral icterus.  Neck: Normal range of motion. Neck supple. No JVD present. No tracheal deviation present. No thyromegaly present.  Cardiovascular: Normal rate, regular rhythm, normal heart sounds and intact distal pulses.   Pulmonary/Chest: Effort normal and breath sounds normal. No respiratory distress. She has no wheezes. She has no rales. She exhibits no tenderness.  Abdominal: Soft. Bowel sounds are normal. She exhibits no distension and no mass. There is no tenderness. There is no rebound and no guarding.  Musculoskeletal:  Normal range of motion. She exhibits no edema or tenderness.  Lymphadenopathy:    She has no cervical adenopathy.  Neurological: She is alert and oriented to person, place, and time. Gait normal.  Skin: Skin is warm and dry. Rash noted. There is erythema. No pallor.  Exam today reveals fluid-filled blisters, erythema, warmth, and edema to the left posterior scalp just above the neck.  Patient also has areas of redness radiating up over her scalp into her right eye.  Patient has right eye, periorbital edema and mild erythema.  Patient denies any pain to any of these sites.  She states that she only has a headache when she moves her head.  Eye exam revealed some surrounding erythema to the tissues; but no conjunctival issues.  All eye movements intact and patient denies any vision issues whatsoever.  Also, there was no eye discharge noted.      Psychiatric: Affect normal.  Nursing note and vitals reviewed.   LABORATORY DATA:. Appointment on 02/14/2016  Component Date Value Ref Range Status  . WBC 02/14/2016 7.5  3.9 - 10.3 10e3/uL Final  . NEUT# 02/14/2016 5.0  1.5 - 6.5 10e3/uL Final  . HGB 02/14/2016 12.0  11.6 - 15.9 g/dL Final  . HCT 02/14/2016 36.7  34.8 - 46.6 % Final  . Platelets 02/14/2016 295  145 - 400 10e3/uL Final  . MCV 02/14/2016 96.3  79.5 - 101.0 fL Final  . MCH 02/14/2016 31.5  25.1 - 34.0 pg Final  . MCHC 02/14/2016 32.7  31.5 - 36.0 g/dL Final  . RBC 02/14/2016 3.81  3.70 - 5.45 10e6/uL Final  . RDW 02/14/2016 13.3  11.2 - 14.5 % Final  . lymph# 02/14/2016 1.7  0.9 - 3.3 10e3/uL Final  . MONO# 02/14/2016 0.5  0.1 - 0.9 10e3/uL Final  . Eosinophils Absolute 02/14/2016 0.3  0.0 - 0.5 10e3/uL Final  . Basophils Absolute 02/14/2016 0.0  0.0 - 0.1 10e3/uL Final  . NEUT% 02/14/2016 67.5  38.4 - 76.8 % Final  . LYMPH% 02/14/2016 22.5  14.0 - 49.7 % Final  . MONO% 02/14/2016 6.4  0.0 - 14.0 % Final  . EOS% 02/14/2016 3.3  0.0 - 7.0 % Final  . BASO% 02/14/2016 0.3  0.0 -  2.0 % Final  . Sodium 02/14/2016 143  136 - 145 mEq/L Final  . Potassium  02/14/2016 3.6  3.5 - 5.1 mEq/L Final  . Chloride 02/14/2016 105  98 - 109 mEq/L Final  . CO2 02/14/2016 30* 22 - 29 mEq/L Final  . Glucose 02/14/2016 115  70 - 140 mg/dl Final  . BUN 02/14/2016 18.4  7.0 - 26.0 mg/dL Final  . Creatinine 02/14/2016 0.9  0.6 - 1.1 mg/dL Final  . Total Bilirubin 02/14/2016 0.30  0.20 - 1.20 mg/dL Final  . Alkaline Phosphatase 02/14/2016 126  40 - 150 U/L Final  . AST 02/14/2016 30  5 - 34 U/L Final  . ALT 02/14/2016 42  0 - 55 U/L Final  . Total Protein 02/14/2016 6.5  6.4 - 8.3 g/dL Final  . Albumin 02/14/2016 3.6  3.5 - 5.0 g/dL Final  . Calcium 02/14/2016 9.0  8.4 - 10.4 mg/dL Final  . Anion Gap 02/14/2016 8  3 - 11 mEq/L Final  . EGFR 02/14/2016 73* >90 ml/min/1.73 m2 Final  . Magnesium 02/14/2016 1.6  1.5 - 2.5 mg/dl Final         RADIOGRAPHIC STUDIES: No results found.  ASSESSMENT/PLAN:    Shingles Patient states that she awoke this morning with a headache, blisters forming to the back of her head, in her right eye swelling.  She states that her hair has begun to grow back in her hair/scalp has been itching for the past 2 weeks as well.  She denies any recent fevers or chills.  Exam today reveals fluid-filled blisters, erythema, warmth, and edema to the left posterior scalp just above the neck.  Patient also has areas of redness radiating up over her scalp into her right eye.  Patient has right eye, periorbital edema and mild erythema.  Patient denies any pain to any of these sites.  She states that she only has a headache when she moves her head.  Eye exam revealed some surrounding erythema to the tissues; but no conjunctival issues.  All eye movements intact and patient denies any vision issues whatsoever.  Also, there was no eye discharge noted.  Dr. Benay Spice in to briefly examine patient as well.  He agreed with the diagnosis of new outbreak shingles with  periorbital involvement.  Will prescribe patient valacyclovir 1 g 3 times per day.  Also, patient already has an eye doctor-Dr. Crecencio Mc; and patient will follow-up with Dr. Venetia Maxon tomorrow morning, 02/15/2016 at 9 AM for further evaluation.  Viral culture was obtained from the fluid-filled blister to the back of the scalp and pending culture results at this time.  Dr. Marylynn Pearson main office # [phone].   Also, patient was advised to call/return or go directly to the emergency department for any worsening symptoms whatsoever.  Will fax a copy of this note to Dr. Lenon Oms office as well.  Infection of nail bed of finger Patient completed her last chemotherapy on 12/15/2015.  She has been treated in the past for anger nail infection with antibiotics; and states that her fingernail infection has essentially resolved.  However, patient continues with discoloration to her nailbeds; and has actually lost 2 of her fingernails completely.  She has Band-Aids over these fingernails at this time.  Hopefully, patient's nails will continue to improve since she has completed all of her chemotherapy now.  Advised patient that she may very well lose more for nails secondary to her previous chemotherapy however.    Breast cancer of lower-outer quadrant of right female breast [facility]) Patient received her final cycle 5, Taxotere/Herceptin chemotherapy on 12/15/2015.  The carboplatin portion of her chemotherapy has been held for quite some time secondary to intolerance of the drug.  She continues to receive Herceptin on an every three-week basis.  The plan is for the patient to initiate oral anastrozole therapy within this next month.  Patient is scheduled to return on 02/24/2016 for labs, visit, and her next Herceptin infusion.   Patient stated understanding of all instructions; and was in agreement with this plan of care. The patient knows to call the clinic with any problems, questions or concerns.    Total time spent with patient was 40 minutes;  with greater than 75 percent of that time spent in face to face counseling regarding patient's symptoms,  and coordination of care and follow up.  Disclaimer:This dictation was prepared with Dragon/digital dictation along with [employer]. Any transcriptional errors that result from this process are unintentional.  Drue Second, NP 02/14/2016

## 2016-02-14 NOTE — Assessment & Plan Note (Addendum)
Patient states that she awoke this morning with a headache, blisters forming to the back of her head, in her right eye swelling.  She states that her hair has begun to grow back in her hair/scalp has been itching for the past 2 weeks as well.  She denies any recent fevers or chills.  Exam today reveals fluid-filled blisters, erythema, warmth, and edema to the left posterior scalp just above the neck.  Patient also has areas of redness radiating up over her scalp into her right eye.  Patient has right eye, periorbital edema and mild erythema.  Patient denies any pain to any of these sites.  She states that she only has a headache when she moves her head.  Eye exam revealed some surrounding erythema to the tissues; but no conjunctival issues.  All eye movements intact and patient denies any vision issues whatsoever.  Also, there was no eye discharge noted.  Dr. Benay Spice in to briefly examine patient as well.  He agreed with the diagnosis of new outbreak shingles with periorbital involvement.  Will prescribe patient valacyclovir 1 g 3 times per day.  Also, patient already has an eye doctor-Dr. Crecencio Mc; and patient will follow-up with Dr. Venetia Maxon tomorrow morning, 02/15/2016 at 9 AM for further evaluation.  Viral culture was obtained from the fluid-filled blister to the back of the scalp and pending culture results at this time.  Dr. Marylynn Pearson main office # [phone].   Also, patient was advised to call/return or go directly to the emergency department for any worsening symptoms whatsoever.  Will fax a copy of this note to Dr. Lenon Oms office as well.

## 2016-02-14 NOTE — Telephone Encounter (Signed)
Spoke with pt and was informed that her right eyelid was swollen when pt woke up this am.  Stated she could still see. Denied pain, denied itching.   Pt also stated there was a knot - size of a 50 cent shape - at base of head near neck area.  Stated the knot has 2 small pimple-like watery spots, and itching very bad.  Denied pain.  Pt was concerned. Instructed pt to come in now for lab and to see symptom management provider.  Pt voiced understanding.

## 2016-02-14 NOTE — Assessment & Plan Note (Signed)
Patient received her final cycle 5, Taxotere/Herceptin chemotherapy on 12/15/2015.  The carboplatin portion of her chemotherapy has been held for quite some time secondary to intolerance of the drug.  She continues to receive Herceptin on an every three-week basis.  The plan is for the patient to initiate oral anastrozole therapy within this next month.  Patient is scheduled to return on 02/24/2016 for labs, visit, and her next Herceptin infusion.

## 2016-02-15 DIAGNOSIS — H2513 Age-related nuclear cataract, bilateral: Secondary | ICD-10-CM | POA: Diagnosis not present

## 2016-02-15 DIAGNOSIS — B029 Zoster without complications: Secondary | ICD-10-CM | POA: Diagnosis not present

## 2016-02-16 ENCOUNTER — Telehealth: Payer: Self-pay | Admitting: *Deleted

## 2016-02-16 NOTE — Telephone Encounter (Signed)
TCT patient to follow up on [facility] visit on 02/14/16. No answer to call and no voice mail available.

## 2016-02-21 ENCOUNTER — Encounter (HOSPITAL_COMMUNITY): Payer: Self-pay | Admitting: Emergency Medicine

## 2016-02-21 ENCOUNTER — Emergency Department (HOSPITAL_COMMUNITY)
Admission: EM | Admit: 2016-02-21 | Discharge: 2016-02-22 | Disposition: A | Discharge status: Home / Self Care | Payer: Medicare Other | Attending: Physician Assistant | Admitting: Physician Assistant

## 2016-02-21 ENCOUNTER — Telehealth: Payer: Self-pay

## 2016-02-21 ENCOUNTER — Telehealth: Payer: Self-pay | Admitting: *Deleted

## 2016-02-21 DIAGNOSIS — Z7982 Long term (current) use of aspirin: Secondary | ICD-10-CM | POA: Insufficient documentation

## 2016-02-21 DIAGNOSIS — Z853 Personal history of malignant neoplasm of breast: Secondary | ICD-10-CM | POA: Diagnosis not present

## 2016-02-21 DIAGNOSIS — E119 Type 2 diabetes mellitus without complications: Secondary | ICD-10-CM | POA: Insufficient documentation

## 2016-02-21 DIAGNOSIS — E039 Hypothyroidism, unspecified: Secondary | ICD-10-CM | POA: Diagnosis not present

## 2016-02-21 DIAGNOSIS — R109 Unspecified abdominal pain: Secondary | ICD-10-CM | POA: Diagnosis present

## 2016-02-21 DIAGNOSIS — K5732 Diverticulitis of large intestine without perforation or abscess without bleeding: Secondary | ICD-10-CM | POA: Diagnosis not present

## 2016-02-21 DIAGNOSIS — I10 Essential (primary) hypertension: Secondary | ICD-10-CM | POA: Diagnosis not present

## 2016-02-21 DIAGNOSIS — K5792 Diverticulitis of intestine, part unspecified, without perforation or abscess without bleeding: Secondary | ICD-10-CM

## 2016-02-21 DIAGNOSIS — J45909 Unspecified asthma, uncomplicated: Secondary | ICD-10-CM | POA: Diagnosis not present

## 2016-02-21 LAB — COMPREHENSIVE METABOLIC PANEL
ALBUMIN: 4 g/dL (ref 3.5–5.0)
ALK PHOS: 121 U/L (ref 38–126)
ALT: 68 U/L — ABNORMAL HIGH (ref 14–54)
ANION GAP: 9 (ref 5–15)
AST: 57 U/L — ABNORMAL HIGH (ref 15–41)
BILIRUBIN TOTAL: 1.1 mg/dL (ref 0.3–1.2)
BUN: 9 mg/dL (ref 6–20)
CALCIUM: 9.3 mg/dL (ref 8.9–10.3)
CO2: 25 mmol/L (ref 22–32)
Chloride: 103 mmol/L (ref 101–111)
Creatinine, Ser: 1.02 mg/dL — ABNORMAL HIGH (ref 0.44–1.00)
GFR calc Af Amer: >60 mL/min (ref >60)
GFR, EST NON AFRICAN AMERICAN: 54 mL/min — AB (ref >60)
GLUCOSE: 141 mg/dL — AB (ref 65–99)
POTASSIUM: 4.1 mmol/L (ref 3.5–5.1)
Sodium: 137 mmol/L (ref 135–145)
TOTAL PROTEIN: 7 g/dL (ref 6.5–8.1)

## 2016-02-21 LAB — CBC
HCT: 38.7 % (ref 36.0–46.0)
HEMOGLOBIN: 12.8 g/dL (ref 12.0–15.0)
MCH: 31.1 pg (ref 26.0–34.0)
MCHC: 33.1 g/dL (ref 30.0–36.0)
MCV: 94.2 fL (ref 78.0–100.0)
Platelets: 321 10*3/uL (ref 150–400)
RBC: 4.11 MIL/uL (ref 3.87–5.11)
RDW: 13.7 % (ref 11.5–15.5)
WBC: 16 10*3/uL — AB (ref 4.0–10.5)

## 2016-02-21 LAB — LIPASE, BLOOD: Lipase: 18 U/L (ref 11–51)

## 2016-02-21 LAB — URINE MICROSCOPIC-ADD ON: RBC / HPF: NONE SEEN RBC/hpf (ref 0–5)

## 2016-02-21 LAB — URINALYSIS, ROUTINE W REFLEX MICROSCOPIC
BILIRUBIN URINE: NEGATIVE
Glucose, UA: NEGATIVE mg/dL
Hgb urine dipstick: NEGATIVE
KETONES UR: NEGATIVE mg/dL
NITRITE: NEGATIVE
Protein, ur: NEGATIVE mg/dL
SPECIFIC GRAVITY, URINE: 1.017 (ref 1.005–1.030)
pH: 6 (ref 5.0–8.0)

## 2016-02-21 MED ORDER — ONDANSETRON HCL 4 MG/2ML IJ SOLN
4.0000 mg | Freq: Once | INTRAMUSCULAR | Status: AC
  Administered 2016-02-21: 4 mg via INTRAVENOUS
  Filled 2016-02-21: qty 2

## 2016-02-21 MED ORDER — SODIUM CHLORIDE 0.9 % IV BOLUS (SEPSIS)
1000.0000 mL | Freq: Once | INTRAVENOUS | Status: AC
  Administered 2016-02-21: 1000 mL via INTRAVENOUS

## 2016-02-21 NOTE — Telephone Encounter (Signed)
Pt called requesting a call back from nurse.   Spoke with pt, and was informed that pt still has shingles; now it has spread to certain areas of her body - Right ankle, right wrist, right elbow.  Stated just red raised circles, denied drainage, denied pain, just severe itching.  However, pt experiences severe abdominal pain today, very tender to touch, no red circles on abdomen, denied swelling, denied feeling bloated - just painful to touch.  Stated has hx of diverticulitis, and this pain is similar to the flare-up.    Stated bowel and bladder function fine.  Able to drink fluids fine, not much appetite.  Pt wanted to know what she should do. Instructed pt to also notify her PCP for diverticulitis problem as well.   Message sent to [place], NP [facility]. Pt's   Phone     [phone].

## 2016-02-21 NOTE — Telephone Encounter (Signed)
Received message regarding triage VM from pt requesting to speak with RN.  According to telephone note, pt reports increase in severity and locations of rash originally evaluated on 11/7 in [facility] and diagnosed as shingles.  Called to obtain more details from pt so that proper plan of care could be created.  Pt reports lesions remain only on right side but in additional spots.  Pt denies pain or drainage to these lesions but does report itching.  Pt also notes having onset of severe abdominal pain which started last evening.  Pt reports pain is located in her abdomen and is tender to touch on the left side.  Pt states she believes this feels like a diverticulosis flare up as she has experienced in the past; however, given her recent chemotherapy treatments and fragility of her immune system, I am concerned that her symptoms warrant timely evaluation.  I informed pt my recommendation is for her to seek evaluation in ED now given the timing of obtaining this information.  Pt does not wish to seek ED at this time and only wants to be seen by [place] tomorrow morning.  Reviewed all of this information with Dr. Jana Hakim in Dr. Geralyn Flash absence who reports pt needs to go to ED now for evaluation given the severity of her symptoms.  Called pt back to let her know this is our recommendation and pt verbalized understanding that MD feels she needs to present to ED rather than waiting any longer for evaluation in fear of symptoms worsening. Pt states she will go to ED now and all questions answered at time of call.

## 2016-02-21 NOTE — ED Notes (Signed)
Patient ambulatory to restroom  ?

## 2016-02-21 NOTE — ED Provider Notes (Signed)
[facility] DEPT Provider Note   CSN: [id number] Arrival date & time: 02/21/16  1829  By signing my name below, I, Tracey Evans, attest that this documentation has been prepared under the direction and in the presence of [name], PA-C Electronically Signed: Soijett Evans, ED Scribe. 02/21/16. 10:55 PM.   History   Chief Complaint Chief Complaint  Patient presents with  . Abdominal Pain    HPI Tracey Evans is a 70 y.o. female with a PMHx of diverticulosis, breast CA, HTN, DM, who presents to the Emergency Department complaining of 8/10 constant left sided abdominal pain onset yesterday worsening today. Accompanied by nausea and vomiting. Pt notes that her abdominal pain is worsened with deep breathing and denies alleviating factors. States her pain is consistent with her previous diverticulitis pain. Pt notes that her last bowel movement was yesterday around 2 PM and that she has daily bowel movements. Last meal was this morning. She states that she has tried Rx tramadol this morning for the relief for her symptoms. She denies diarrhea, fever, dizziness, or any other complaints.   Pt states that she finished infusion chemotherapy for breast cancer 2 months ago and is currently taking oral chemo.    The history is provided by the patient. No language interpreter was used.    Past Medical History:  Diagnosis Date  . Angina at rest [facility])    "occurs whenever it wants to, but worse during agitation"  . Anxiety   . Asthma   . Binge eating disorder   . Breast cancer of lower-outer quadrant of left female breast ([place]) 07/08/2015   "NEVER HAD LEFT BREAST CANCER" (08/15/2015)  . Cancer of right breast ([place]) 08/15/2015  . Chronic bronchitis ([place])   . Chronic lower back pain   . Complication of anesthesia    had bronc spasms during intubation surgery 2009 on foot-need albuterol inhaler or neb tx preop  . Costochondritis   . Depression   . Diverticular disease   . Fibromyalgia   .  GERD (gastroesophageal reflux disease)   . Hot flashes   . Hyperlipidemia   . Hypertension   . Hypothyroidism   . Osteoarthritis    "all over"  . Peptic ulcer disease   . Shortness of breath dyspnea   . Type II diabetes mellitus ([place])    "diet controlled" (08/15/2015)    Patient Active Problem List   Diagnosis Date Noted  . Shingles 02/14/2016  . Anemia associated with nutritional deficiency 01/13/2016  . Infection of nail bed of finger 12/22/2015  . Syncope 10/05/2015  . Genetic testing 08/21/2015  . Family history of breast cancer in female 07/15/2015  . Family history of colon cancer 07/15/2015  . Breast cancer of lower-outer quadrant of right female breast ([place]) 07/08/2015  . Disturbance in sleep behavior 11/18/2014  . Fibromyalgia 10/04/2014  . Flu-like symptoms 07/09/2014  . Diverticulitis of colon (without mention of hemorrhage)(562.11) 12/01/2013  . UTI (urinary tract infection) 12/01/2013  . Abdominal pain, unspecified site 12/01/2013  . Dry mouth 04/29/2013  . Dry eye 04/29/2013  . Dry skin 04/29/2013  . Obesity (BMI 30-39.9) 03/11/2013  . Grief 10/17/2012  . Edema 09/04/2012  . Depression 09/04/2012  . Chemotherapy-induced peripheral neuropathy ([place]) 05/12/2012  . OAB (overactive bladder) 04/01/2012  . DM w/o complication type II ([place]) 02/06/2012  . Colitis 01/21/2012  . Allergy to dogs 04/19/2011  . Polyarthralgia 09/18/2010  . STRESS INCONTINENCE 04/25/2010  . ASTHMA 04/11/2010  . VERTIGO 04/11/2010  .  Hypothyroidism 01/19/2010  . Hyperlipidemia associated with type 2 diabetes mellitus ([place]) 01/19/2010  . DEPRESSION 01/19/2010  . Essential hypertension 01/19/2010  . GERD 01/19/2010  . PEPTIC ULCER DISEASE 01/19/2010  . OSTEOARTHRITIS 01/19/2010  . LOW BACK PAIN 01/19/2010    Past Surgical History:  Procedure Laterality Date  . ABDOMINAL HYSTERECTOMY     "left my ovaries"  . ANKLE ARTHROSCOPY  01/02/2012   Procedure: ANKLE ARTHROSCOPY;  Surgeon:  Colin Rhein, MD;  Location: [facility];  Service: Orthopedics;  Laterality: Right;  right ankle arthroscopy with extensive debridement , dridement and drilling talar dome osteochondral lesion  . ANKLE FRACTURE SURGERY Right 2009   Dr. Rolena Infante  . BREAST BIOPSY  07/2015  . CARDIAC CATHETERIZATION    . CARPAL TUNNEL RELEASE Bilateral   . COLONOSCOPY    . DILATION AND CURETTAGE OF UTERUS     "before hysterectomy"  . FRACTURE SURGERY    . KNEE ARTHROSCOPY Bilateral   . LAPAROSCOPIC CHOLECYSTECTOMY    . MASTECTOMY COMPLETE / SIMPLE W/ SENTINEL NODE BIOPSY Right 08/15/2015  . MASTECTOMY W/ SENTINEL NODE BIOPSY Right 08/15/2015   Procedure: TOTAL MASTECTOMY WITH SENTINEL LYMPH NODE BIOPSY AND Evans DYE INJECTION ;  Surgeon: Fanny Skates, MD;  Location: [place];  Service: General;  Laterality: Right;  . PORTACATH PLACEMENT Left 08/15/2015  . PORTACATH PLACEMENT Left 08/15/2015   Procedure: INSERTION PORT-A-CATH ;  Surgeon: Fanny Skates, MD;  Location: [facility];  Service: General;  Laterality: Left;  . SHOULDER ARTHROSCOPY Bilateral 2009-2011   "bone spurs"  . TONSILLECTOMY      OB History    No data available       Home Medications    Prior to Admission medications   Medication Sig Start Date End Date Taking? Authorizing Provider  albuterol (PROAIR HFA) 108 (90 BASE) MCG/ACT inhaler Inhale 2 puffs into the lungs every 6 (six) hours as needed for wheezing. 08/30/14  Yes Midge Minium, MD  ALPRAZolam Duanne Moron) 1 MG tablet Take 1 tablet (1 mg total) by mouth 2 (two) times daily as needed. for anxiety 10/24/15  Yes Midge Minium, MD  anastrozole (ARIMIDEX) 1 MG tablet Take 1 tablet (1 mg total) by mouth daily. 01/13/16  Yes Nicholas Lose, MD  aspirin 81 MG tablet Take 81 mg by mouth daily.   Yes Historical Provider, MD  atorvastatin (LIPITOR) 20 MG tablet TAKE 1 TABLET BY MOUTH EVERY DAY Patient taking differently: TAKE 1 TABLET BY MOUTH EVERY EVENING 03/07/15  Yes Midge Minium, MD  buPROPion (WELLBUTRIN XL) 300 MG 24 hr tablet TAKE 1 TABLET BY MOUTH EVERY DAY 10/24/15  Yes Midge Minium, MD  levothyroxine (SYNTHROID, LEVOTHROID) 75 MCG tablet Take 1 tablet (75 mcg total) by mouth daily. 09/07/15  Yes Midge Minium, MD  lidocaine-prilocaine (EMLA) cream Apply to affected area once 08/24/15  Yes Nicholas Lose, MD  meloxicam (MOBIC) 15 MG tablet Take 15 mg by mouth daily.  06/04/15  Yes Historical Provider, MD  nitroGLYCERIN (NITROSTAT) 0.4 MG SL tablet Place 1 tablet (0.4 mg total) under the tongue every 5 (five) minutes as needed. As needed for chest pain 11/18/14  Yes Midge Minium, MD  Polyethyl Glycol-Propyl Glycol (SYSTANE ULTRA) 0.4-0.3 % SOLN Place 1 drop into both eyes 3 (three) times daily as needed (for dry eyes).    Yes Historical Provider, MD  ranitidine (ZANTAC) 150 MG tablet Take 150 mg by mouth 2 (two) times daily as needed  for heartburn. Reported on 10/05/2015   Yes Historical Provider, MD  traMADol (ULTRAM) 50 MG tablet Take 50 mg by mouth daily as needed for moderate pain. Reported on 10/05/2015 04/08/15  Yes Historical Provider, MD  valACYclovir (VALTREX) 1000 MG tablet Take 1 tablet (1,000 mg total) by mouth 3 (three) times daily. 02/14/16  Yes Susanne Borders, NP  amoxicillin-clavulanate (AUGMENTIN) 875-125 MG tablet Take 1 tablet by mouth every 8 (eight) hours. 02/22/16 02/29/16  Amani Nodarse C Kaige Whistler, PA-C  magic mouthwash w/lidocaine SOLN 15ml Swish,Swallow, or spit 4 times a day as needed Patient not taking: Reported on 02/22/2016 09/30/15   Nicholas Lose, MD  ondansetron (ZOFRAN ODT) 4 MG disintegrating tablet Take 1 tablet (4 mg total) by mouth every 8 (eight) hours as needed for nausea or vomiting. 02/22/16   Khila Papp C Krystalynn Ridgeway, PA-C  prochlorperazine (COMPAZINE) 10 MG tablet Take 1 tablet (10 mg total) by mouth every 6 (six) hours as needed (Nausea or vomiting). 11/16/15   Nicholas Lose, MD    Family History Family History  Problem Relation Age of Onset   . Dementia Mother   . Stroke Mother   . Heart Problems Mother   . Dementia Father   . Alcohol abuse Father   . Colon cancer Father     dx. 50 or younger  . Breast cancer Sister 25    inflammatory  . Diverticulitis Sister     maternal half-sister; severe - causing partial colectomy  . Breast cancer Sister     maternal half-sister; dx. early 23s  . Breast cancer Other 62    niece  . Breast cancer Other     niece dx. 16s  . Alcohol abuse Brother     Social History Social History  Substance Use Topics  . Smoking status: Never Smoker  . Smokeless tobacco: Never Used  . Alcohol use 0.0 oz/week     Comment: 08/15/2015 "1/2 glass of wine q 2 months"      Allergies   Contrast media [iodinated diagnostic agents]; Dilaudid [hydromorphone hcl]; Adhesive [tape]; Codeine; and Lactose intolerance (gi)   Review of Systems Review of Systems  Constitutional: Positive for appetite change. Negative for chills, diaphoresis and fever.  Gastrointestinal: Positive for abdominal pain (left sided), nausea and vomiting. Negative for diarrhea.  Genitourinary: Negative for dysuria.  All other systems reviewed and are negative.    Physical Exam Updated Vital Signs BP 111/74 (BP Location: Left Arm)   Pulse 101   Temp 98.5 F (36.9 C) (Oral)   Resp 18   SpO2 91%   Physical Exam  Constitutional: She appears well-developed and well-nourished. No distress.  HENT:  Head: Normocephalic and atraumatic.  Eyes: Conjunctivae are normal.  Neck: Neck supple.  Cardiovascular: Normal rate, regular rhythm, normal heart sounds and intact distal pulses.   Pulmonary/Chest: Effort normal and breath sounds normal. No respiratory distress.  Abdominal: Soft. Bowel sounds are normal. There is tenderness in the left upper quadrant and left lower quadrant. There is no guarding.  Musculoskeletal: She exhibits no edema.  Lymphadenopathy:    She has no cervical adenopathy.  Neurological: She is alert.  Skin:  Skin is warm and dry. She is not diaphoretic.  Psychiatric: She has a normal mood and affect. Her behavior is normal.  Nursing note and vitals reviewed.    ED Treatments / Results  DIAGNOSTIC STUDIES: Oxygen Saturation is 96% on RA, nl by my interpretation.    COORDINATION OF CARE: 10:54 PM Discussed treatment plan  with pt at bedside which includes labs, UA, and pt agreed to plan.   Labs (all labs ordered are listed, but only abnormal results are displayed) Labs Reviewed  COMPREHENSIVE METABOLIC PANEL - Abnormal; Notable for the following:       Result Value   Glucose, Bld 141 (*)    Creatinine, Ser 1.02 (*)    AST 57 (*)    ALT 68 (*)    GFR calc non Af Amer 54 (*)    All other components within normal limits  CBC - Abnormal; Notable for the following:    WBC 16.0 (*)    All other components within normal limits  URINALYSIS, ROUTINE W REFLEX MICROSCOPIC (NOT AT [facility]) - Abnormal; Notable for the following:    Leukocytes, UA TRACE (*)    All other components within normal limits  URINE MICROSCOPIC-ADD ON - Abnormal; Notable for the following:    Squamous Epithelial / LPF 0-5 (*)    Bacteria, UA RARE (*)    All other components within normal limits  LIPASE, BLOOD    Radiology Ct Abdomen Pelvis Wo Contrast  Result Date: 02/22/2016 CLINICAL DATA:  Acute onset of left-sided abdominal pain, nausea and vomiting. Constipation. Increased urinary urgency and frequency. Decreased appetite. Leukocytosis. Initial encounter. EXAM: CT ABDOMEN AND PELVIS WITHOUT CONTRAST TECHNIQUE: Multidetector CT imaging of the abdomen and pelvis was performed following the standard protocol without IV contrast. COMPARISON:  CT of the abdomen and pelvis from 09/27/2015 FINDINGS: Lower chest: The visualized lung bases are grossly clear. The visualized portions of the mediastinum are unremarkable. Hepatobiliary: The liver is unremarkable in appearance. The patient is status post cholecystectomy, with clips  noted at the gallbladder fossa. The common bile duct remains normal in caliber. Pancreas: The pancreas is within normal limits. Spleen: The spleen is unremarkable in appearance. Adrenals/Urinary Tract: The adrenal glands are unremarkable in appearance. The kidneys are within normal limits. There is no evidence of hydronephrosis. No renal or ureteral stones are identified. Nonspecific perinephric stranding is noted bilaterally. Stomach/Bowel: The stomach is unremarkable in appearance. The small bowel is within normal limits. The appendix is normal in caliber, without evidence of appendicitis. Scattered diverticulosis is noted along the entirety of the colon. Focal soft tissue inflammation is noted at the mid descending colon, with wall thickening and inflamed diverticula, compatible with acute diverticulitis. Trace free fluid is seen tracking along the left paracolic gutter. There may be a thin lucency extending into the inflamed diverticulum, though this may be artifactual in nature. Vascular/Lymphatic: Scattered calcification is seen along the abdominal aorta and its branches. The abdominal aorta is otherwise grossly unremarkable. The inferior vena cava is grossly unremarkable. No retroperitoneal lymphadenopathy is seen. No pelvic sidewall lymphadenopathy is identified. Reproductive: The bladder is mildly distended and grossly unremarkable. The patient is status post hysterectomy. No suspicious adnexal masses are seen. Other: No additional soft tissue abnormalities are seen. Musculoskeletal: No acute osseous abnormalities are identified. Mild facet disease is noted at the lower lumbar spine. The visualized musculature is unremarkable in appearance. IMPRESSION: 1. Acute diverticulitis of the mid descending colon, with wall thickening and focal soft tissue inflammation. Trace free fluid along the left pericolic gutter. No evidence of perforation or abscess formation. There may be thin lucency extending into the  inflamed diverticulum, raising question for foreign ingested material, though this may be artifactual in nature. 2. Scattered diverticulosis along the entirety of the colon. 3. Scattered aortic atherosclerosis. Electronically Signed   By: Garald Balding  M.D.   On: 02/22/2016 01:52    Procedures Procedures (including critical care time)  Medications Ordered in ED Medications  sodium chloride 0.9 % bolus 1,000 mL (0 mLs Intravenous Stopped 02/22/16 0007)  ondansetron (ZOFRAN) injection 4 mg (4 mg Intravenous Given 02/21/16 2311)  amoxicillin-clavulanate (AUGMENTIN) 875-125 MG per tablet 1 tablet (1 tablet Oral Given 02/22/16 0236)  heparin lock flush 100 unit/mL (500 Units Intracatheter Given 02/22/16 0249)     Initial Impression / Assessment and Plan / ED Course  I have reviewed the triage vital signs and the nursing notes.  Pertinent labs & imaging results that were available during my care of the patient were reviewed by me and considered in my medical decision making (see chart for details).  Clinical Course      Patient presents with left lower quadrant abdominal pain consistent with her previous diverticulitis flares. Patient is nontoxic appearing, afebrile, not tachypneic, not hypotensive, and is in no apparent distress. Patient has no signs of sepsis or other serious or life-threatening condition. Acute diverticulitis without abscess or perforation noted on CT. Based on her vital signs and patient's presentation, she was offered the option for outpatient therapy. Home care and strict return precautions discussed. Patient voices understanding of all instructions and is comfortable with discharge.  There were no pain management options I felt comfortable giving to this patient due to her allergy list and her past medical history. She was offered Tylenol, but declined.  Findings and plan of care discussed with Zenovia Jarred, MD. Dr. Thomasene Lot personally evaluated and examined this  patient.  Vitals:   02/21/16 2115 02/22/16 0059 02/22/16 0212 02/22/16 0241  BP: 132/95 111/74  137/99  Pulse: 112 101  95  Resp: 17 18  18   Temp:   98.6 F (37 C)   TempSrc:   Oral   SpO2: 96% 91%  96%     Final Clinical Impressions(s) / ED Diagnoses   Final diagnoses:  Diverticulitis of intestine without perforation or abscess, unspecified bleeding status, unspecified part of intestinal tract    New Prescriptions Discharge Medication List as of 02/22/2016  2:33 AM    START taking these medications   Details  amoxicillin-clavulanate (AUGMENTIN) 875-125 MG tablet Take 1 tablet by mouth every 8 (eight) hours., Starting Wed 02/22/2016, Until Wed 02/29/2016, Print    ondansetron (ZOFRAN ODT) 4 MG disintegrating tablet Take 1 tablet (4 mg total) by mouth every 8 (eight) hours as needed for nausea or vomiting., Starting Wed 02/22/2016, Print       I personally performed the services described in this documentation, which was scribed in my presence. The recorded information has been reviewed and is accurate.    Lorayne Bender, PA-C 02/22/16 [place], MD 02/22/16 [id number]

## 2016-02-21 NOTE — ED Triage Notes (Signed)
Patient c/o left lateral abd pain and nausea that started today.  Patient was treated for shingles last Tuesday.  Patient is breast cancer patient that has finished chemo and radiation but taking oral chemo.

## 2016-02-22 ENCOUNTER — Emergency Department (HOSPITAL_COMMUNITY): Payer: Medicare Other

## 2016-02-22 DIAGNOSIS — K5732 Diverticulitis of large intestine without perforation or abscess without bleeding: Secondary | ICD-10-CM | POA: Diagnosis not present

## 2016-02-22 LAB — VIRUS CULTURE

## 2016-02-22 MED ORDER — ONDANSETRON 4 MG PO TBDP: 4.0000 mg | ORAL_TABLET | Freq: Three times a day (TID) | ORAL | 0 refills | Status: DC | PRN

## 2016-02-22 MED ORDER — HEPARIN SOD (PORK) LOCK FLUSH 100 UNIT/ML IV SOLN
500.0000 [IU] | Freq: Once | INTRAVENOUS | Status: AC
  Administered 2016-02-22: 500 [IU]
  Filled 2016-02-22: qty 5

## 2016-02-22 MED ORDER — AMOXICILLIN-POT CLAVULANATE 875-125 MG PO TABS: 1.0000 | ORAL_TABLET | Freq: Three times a day (TID) | ORAL | 0 refills | Status: AC

## 2016-02-22 MED ORDER — CIPROFLOXACIN IN D5W 400 MG/200ML IV SOLN
400.0000 mg | Freq: Once | INTRAVENOUS | Status: DC
Start: 2016-02-22 — End: 2016-02-22

## 2016-02-22 MED ORDER — AMOXICILLIN-POT CLAVULANATE 875-125 MG PO TABS
1.0000 | ORAL_TABLET | Freq: Once | ORAL | Status: AC
  Administered 2016-02-22: 1 via ORAL
  Filled 2016-02-22: qty 1

## 2016-02-22 MED ORDER — METRONIDAZOLE IN NACL 5-0.79 MG/ML-% IV SOLN
500.0000 mg | Freq: Once | INTRAVENOUS | Status: DC
  Filled 2016-02-22: qty 100

## 2016-02-22 NOTE — ED Notes (Signed)
Patient transported to CT 

## 2016-02-22 NOTE — ED Notes (Signed)
PT ambulatory and independent at discharge.  Verbalized understanding of discharge instructions.

## 2016-02-22 NOTE — Discharge Instructions (Signed)
There was evidence of diverticulitis on the CT scan. We will try treatment with an antibiotic.  Please take all of your antibiotics until finished!   You may develop abdominal discomfort or diarrhea from the antibiotic.  You may help offset this with probiotics which you can buy or get in yogurt. Do not eat or take the probiotics until 2 hours after your antibiotic.  Follow up with your primary care provider for continuing management of this issue. Return to the ED should symptoms worsen or you develop fever, dizziness, severe increase in pain, or any other concerns.

## 2016-02-22 NOTE — ED Provider Notes (Signed)
HPI Comments: Tracey Evans is a 70 y.o. female who presents to the Emergency Department complaining of left sided lateral abdominal pain that began November 13 . Pt also reports nausea and vomiting while in the ED. Pt denies any fevers, chills, diarrhea or any other symptoms at this time.  She reports she's had this pain in the past with diverticulitis.  Physical Exam Patient is pleasant and alert sitting in a chair at the bedside. LLQ abdominal and left lateral abdominal tenderness to exam. She has some guarding but no rebound.  Patient and PA and I discussed her treatment plan and patient wants to try outpatient treatment first.  Medical screening examination/treatment/procedure(s) were conducted as a shared visit with non-physician practitioner(s) and myself.  I personally evaluated the patient during the encounter.   EKG Interpretation None       Rolland Porter, MD, FACEP   I personally performed the services described in this documentation, which was scribed in my presence. The recorded information has been reviewed and considered.  Rolland Porter, MD, Barbette Or, MD 02/23/16 [phone]

## 2016-02-24 ENCOUNTER — Ambulatory Visit (HOSPITAL_BASED_OUTPATIENT_CLINIC_OR_DEPARTMENT_OTHER): Payer: Medicare Other | Admitting: Hematology and Oncology

## 2016-02-24 ENCOUNTER — Encounter: Payer: Self-pay | Admitting: Hematology and Oncology

## 2016-02-24 ENCOUNTER — Encounter: Payer: Self-pay | Admitting: *Deleted

## 2016-02-24 ENCOUNTER — Ambulatory Visit (HOSPITAL_BASED_OUTPATIENT_CLINIC_OR_DEPARTMENT_OTHER): Payer: Medicare Other

## 2016-02-24 ENCOUNTER — Other Ambulatory Visit (HOSPITAL_BASED_OUTPATIENT_CLINIC_OR_DEPARTMENT_OTHER): Payer: Medicare Other

## 2016-02-24 VITALS — BP 134/77 | HR 79 | Temp 97.7°F | Resp 18 | Ht 65.0 in | Wt 177.1 lb

## 2016-02-24 DIAGNOSIS — Z170 Estrogen receptor positive status [ER+]: Secondary | ICD-10-CM

## 2016-02-24 DIAGNOSIS — M1711 Unilateral primary osteoarthritis, right knee: Secondary | ICD-10-CM | POA: Diagnosis not present

## 2016-02-24 DIAGNOSIS — C50511 Malignant neoplasm of lower-outer quadrant of right female breast: Secondary | ICD-10-CM | POA: Diagnosis not present

## 2016-02-24 DIAGNOSIS — G620 Drug-induced polyneuropathy: Secondary | ICD-10-CM

## 2016-02-24 DIAGNOSIS — L03019 Cellulitis of unspecified finger: Secondary | ICD-10-CM

## 2016-02-24 DIAGNOSIS — K5732 Diverticulitis of large intestine without perforation or abscess without bleeding: Secondary | ICD-10-CM

## 2016-02-24 DIAGNOSIS — M47816 Spondylosis without myelopathy or radiculopathy, lumbar region: Secondary | ICD-10-CM | POA: Diagnosis not present

## 2016-02-24 DIAGNOSIS — Z5112 Encounter for antineoplastic immunotherapy: Secondary | ICD-10-CM | POA: Diagnosis not present

## 2016-02-24 DIAGNOSIS — T451X5A Adverse effect of antineoplastic and immunosuppressive drugs, initial encounter: Secondary | ICD-10-CM

## 2016-02-24 DIAGNOSIS — Z79811 Long term (current) use of aromatase inhibitors: Secondary | ICD-10-CM

## 2016-02-24 DIAGNOSIS — D509 Iron deficiency anemia, unspecified: Secondary | ICD-10-CM

## 2016-02-24 LAB — COMPREHENSIVE METABOLIC PANEL
ALBUMIN: 3.3 g/dL — AB (ref 3.5–5.0)
ALK PHOS: 131 U/L (ref 40–150)
ALT: 53 U/L (ref 0–55)
ANION GAP: 10 meq/L (ref 3–11)
AST: 26 U/L (ref 5–34)
BILIRUBIN TOTAL: 0.32 mg/dL (ref 0.20–1.20)
BUN: 11.4 mg/dL (ref 7.0–26.0)
CALCIUM: 10 mg/dL (ref 8.4–10.4)
CO2: 26 mEq/L (ref 22–29)
Chloride: 106 mEq/L (ref 98–109)
Creatinine: 1.1 mg/dL (ref 0.6–1.1)
EGFR: 62 mL/min/{1.73_m2} — AB (ref >90)
Glucose: 98 mg/dl (ref 70–140)
POTASSIUM: 4.6 meq/L (ref 3.5–5.1)
Sodium: 142 mEq/L (ref 136–145)
TOTAL PROTEIN: 6.8 g/dL (ref 6.4–8.3)

## 2016-02-24 LAB — CBC WITH DIFFERENTIAL/PLATELET
BASO%: 0.5 % (ref 0.0–2.0)
BASOS ABS: 0 10*3/uL (ref 0.0–0.1)
EOS ABS: 0.3 10*3/uL (ref 0.0–0.5)
EOS%: 5.8 % (ref 0.0–7.0)
HCT: 35.3 % (ref 34.8–46.6)
HEMOGLOBIN: 11.8 g/dL (ref 11.6–15.9)
LYMPH%: 35 % (ref 14.0–49.7)
MCH: 32.2 pg (ref 25.1–34.0)
MCHC: 33.4 g/dL (ref 31.5–36.0)
MCV: 96.4 fL (ref 79.5–101.0)
MONO#: 0.5 10*3/uL (ref 0.1–0.9)
MONO%: 8.7 % (ref 0.0–14.0)
NEUT%: 50 % (ref 38.4–76.8)
NEUTROS ABS: 2.8 10*3/uL (ref 1.5–6.5)
PLATELETS: 291 10*3/uL (ref 145–400)
RBC: 3.66 10*6/uL — ABNORMAL LOW (ref 3.70–5.45)
RDW: 13.7 % (ref 11.2–14.5)
WBC: 5.5 10*3/uL (ref 3.9–10.3)
lymph#: 1.9 10*3/uL (ref 0.9–3.3)

## 2016-02-24 MED ORDER — ACETAMINOPHEN 325 MG PO TABS
ORAL_TABLET | ORAL | Status: AC
  Filled 2016-02-24: qty 2

## 2016-02-24 MED ORDER — HEPARIN SOD (PORK) LOCK FLUSH 100 UNIT/ML IV SOLN
500.0000 [IU] | Freq: Once | INTRAVENOUS | Status: AC | PRN
  Administered 2016-02-24: 500 [IU]
  Filled 2016-02-24: qty 5

## 2016-02-24 MED ORDER — TRASTUZUMAB CHEMO 150 MG IV SOLR
6.0000 mg/kg | Freq: Once | INTRAVENOUS | Status: AC
  Administered 2016-02-24: 483 mg via INTRAVENOUS
  Filled 2016-02-24: qty 23

## 2016-02-24 MED ORDER — DIPHENHYDRAMINE HCL 25 MG PO CAPS
ORAL_CAPSULE | ORAL | Status: AC
  Filled 2016-02-24: qty 2

## 2016-02-24 MED ORDER — SODIUM CHLORIDE 0.9% FLUSH
10.0000 mL | INTRAVENOUS | Status: DC | PRN
  Administered 2016-02-24: 10 mL
  Filled 2016-02-24: qty 10

## 2016-02-24 MED ORDER — DIPHENHYDRAMINE HCL 25 MG PO CAPS
50.0000 mg | ORAL_CAPSULE | Freq: Once | ORAL | Status: AC
  Administered 2016-02-24: 50 mg via ORAL

## 2016-02-24 MED ORDER — SODIUM CHLORIDE 0.9 % IV SOLN
Freq: Once | INTRAVENOUS | Status: AC
  Administered 2016-02-24: 10:00:00 via INTRAVENOUS

## 2016-02-24 MED ORDER — TRASTUZUMAB CHEMO 150 MG IV SOLR: 6.0000 mg/kg | Freq: Once | INTRAVENOUS | Status: DC

## 2016-02-24 MED ORDER — ACETAMINOPHEN 325 MG PO TABS
650.0000 mg | ORAL_TABLET | Freq: Once | ORAL | Status: AC
  Administered 2016-02-24: 650 mg via ORAL

## 2016-02-24 NOTE — Patient Instructions (Signed)
[facility] Discharge Instructions for Patients  Today you received the following: Herceptin   To help prevent nausea and vomiting after your treatment, we encourage you to take your nausea medication as directed.   If you develop nausea and vomiting that is not controlled by your nausea medication, call the clinic.   BELOW ARE SYMPTOMS THAT SHOULD BE REPORTED IMMEDIATELY:  *FEVER GREATER THAN 100.5 F  *CHILLS WITH OR WITHOUT FEVER  NAUSEA AND VOMITING THAT IS NOT CONTROLLED WITH YOUR NAUSEA MEDICATION  *UNUSUAL SHORTNESS OF BREATH  *UNUSUAL BRUISING OR BLEEDING  TENDERNESS IN MOUTH AND THROAT WITH OR WITHOUT PRESENCE OF ULCERS  *URINARY PROBLEMS  *BOWEL PROBLEMS  UNUSUAL RASH Items with * indicate a potential emergency and should be followed up as soon as possible.  Feel free to call the clinic you have any questions or concerns. The clinic phone number is [phone].  Please show the CHEMO ALERT CARD at check-in to the Emergency Department and triage nurse.   

## 2016-02-24 NOTE — Assessment & Plan Note (Signed)
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 Adjuvant chemotherapy with [place] 2. Followed by adjuvant antiestrogen therapy with anastrozole 5 years No role of radiation since she had mastectomy. ----------------------------------------------------------------------------------------------------------------------- Current treatment: Completed 5 cycles of TCH, chemotherapy discontinued for neuropathy. Patient is on Herceptin maintenance Echocardiogram was reviewed 08/25/2015: EF 65-70%  Acute Diverticulitis: ED visit 02/22/16 Shingles  Plan: 1. Continue herceptin q 3 weeks 2. Anastrozole 1 mg daily

## 2016-02-24 NOTE — Progress Notes (Signed)
Patient Care Team: Midge Minium, MD as PCP - General [name], MD as Consulting Physician (Cardiology) Marylynn Pearson, MD as Consulting Physician (Ophthalmology) Nicholas Lose, MD as Consulting Physician (Hematology and Oncology) Kyung Rudd, MD as Consulting Physician (Radiation Oncology) Fanny Skates, MD as Consulting Physician (General Surgery)  DIAGNOSIS:  Encounter Diagnoses  Name Primary?  . Malignant neoplasm of lower-outer quadrant of right breast of female, estrogen receptor positive ([place])   . 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 She complained of abdominal pain was was sent to the emergency room where she was diagnosed with acute diverticulitis and was put on Augmentin. Her abdominal pain improved significantly. She thinks she brought it done because she ate almonds. She denies any fevers or chills. The  left lower quadrant abdominal pain is also markedly improved. She is not eating more normal diet.  REVIEW OF SYSTEMS:   Constitutional: Denies fevers, chills or abnormal weight loss Eyes: Denies blurriness of vision Ears, nose, mouth, throat, and face: Denies mucositis or sore throat Respiratory: Denies cough, dyspnea or wheezes Cardiovascular: Denies palpitation, chest discomfort Gastrointestinal: Abdominal pain Skin: Denies abnormal skin rashes Lymphatics: Denies new lymphadenopathy or easy bruising Neurological:Denies numbness, tingling or new weaknesses Behavioral/Psych: Mood is stable, no new changes  Extremities: No lower extremity edema Breast:  denies any pain or lumps or nodules in either breasts All other systems were reviewed with the patient and are negative.  I have reviewed the past medical history, past surgical history, social history and family history with the patient and they are unchanged from previous note.  ALLERGIES:  is allergic to contrast media [iodinated diagnostic agents]; dilaudid [hydromorphone hcl]; adhesive [tape]; codeine; and lactose intolerance (gi).  MEDICATIONS:  Current Outpatient Prescriptions  Medication Sig Dispense Refill  . albuterol (PROAIR HFA) 108 (90 BASE) MCG/ACT inhaler Inhale 2 puffs into the lungs every 6 (six) hours as needed for wheezing. 1 Inhaler 3  . ALPRAZolam (XANAX) 1 MG tablet Take 1 tablet (1 mg total) by mouth 2 (two) times daily as needed. for anxiety 60 tablet 3  . amoxicillin-clavulanate (AUGMENTIN) 875-125 MG tablet Take 1 tablet by mouth every 8 (eight) hours. 21 tablet 0  . anastrozole (ARIMIDEX) 1 MG tablet Take 1 tablet (1 mg total) by mouth daily. 90 tablet 3  . aspirin 81 MG tablet Take 81 mg by mouth daily.    Marland Kitchen atorvastatin (LIPITOR) 20  MG tablet TAKE 1 TABLET BY MOUTH EVERY DAY (Patient taking differently: TAKE 1 TABLET BY MOUTH EVERY EVENING) 30 tablet 6  . buPROPion (WELLBUTRIN XL) 300 MG 24 hr tablet TAKE 1  TABLET BY MOUTH EVERY DAY 30 tablet 3  . levothyroxine (SYNTHROID, LEVOTHROID) 75 MCG tablet Take 1 tablet (75 mcg total) by mouth daily. 30 tablet 6  . lidocaine-prilocaine (EMLA) cream Apply to affected area once 30 g 3  . magic mouthwash w/lidocaine SOLN 67m Swish,Swallow, or spit 4 times a day as needed (Patient not taking: Reported on 02/22/2016) 240 mL 1  . meloxicam (MOBIC) 15 MG tablet Take 15 mg by mouth daily.     . nitroGLYCERIN (NITROSTAT) 0.4 MG SL tablet Place 1 tablet (0.4 mg total) under the tongue every 5 (five) minutes as needed. As needed for chest pain 30 tablet 3  . ondansetron (ZOFRAN ODT) 4 MG disintegrating tablet Take 1 tablet (4 mg total) by mouth every 8 (eight) hours as needed for nausea or vomiting. 20 tablet 0  . Polyethyl Glycol-Propyl Glycol (SYSTANE ULTRA) 0.4-0.3 % SOLN Place 1 drop into both eyes 3 (three) times daily as needed (for dry eyes).     . prochlorperazine (COMPAZINE) 10 MG tablet Take 1 tablet (10 mg total) by mouth every 6 (six) hours as needed (Nausea or vomiting). 90 tablet 0  . ranitidine (ZANTAC) 150 MG tablet Take 150 mg by mouth 2 (two) times daily as needed for heartburn. Reported on 10/05/2015    . traMADol (ULTRAM) 50 MG tablet Take 50 mg by mouth daily as needed for moderate pain. Reported on 10/05/2015  0  . valACYclovir (VALTREX) 1000 MG tablet Take 1 tablet (1,000 mg total) by mouth 3 (three) times daily. 30 tablet 0   No current facility-administered medications for this visit.    Facility-Administered Medications Ordered in Other Visits  Medication Dose Route Frequency Provider Last Rate Last Dose  . sodium chloride flush (NS) 0.9 % injection 10 mL  10 mL Intracatheter PRN VNicholas Lose MD   10 mL at 12/15/15 1439    PHYSICAL EXAMINATION: ECOG PERFORMANCE STATUS: 1 - Symptomatic but completely ambulatory  Vitals:   02/24/16 0944  BP: 134/77  Pulse: 79  Resp: 18  Temp: 97.7 F (36.5 C)   Filed Weights   02/24/16 0944  Weight:  177 lb 1.6 oz (80.3 kg)    GENERAL:alert, no distress and comfortable SKIN: skin color, texture, turgor are normal, no rashes or significant lesions EYES: normal, Conjunctiva are pink and non-injected, sclera clear OROPHARYNX:no exudate, no erythema and lips, buccal mucosa, and tongue normal  NECK: supple, thyroid normal size, non-tender, without nodularity LYMPH:  no palpable lymphadenopathy in the cervical, axillary or inguinal LUNGS: clear to auscultation and percussion with normal breathing effort HEART: regular rate & rhythm and no murmurs and no lower extremity edema ABDOMEN:Tenderness left lower quadrant MUSCULOSKELETAL:no cyanosis of digits and no clubbing  NEURO: alert & oriented x 3 with fluent speech, no focal motor/sensory deficits EXTREMITIES: No lower extremity edema  LABORATORY DATA:  I have reviewed the data as listed   Chemistry      Component Value Date/Time   NA 137 02/21/2016 1927   NA 143 02/14/2016 0934   K 4.1 02/21/2016 1927   K 3.6 02/14/2016 0934   CL 103 02/21/2016 1927   CO2 25 02/21/2016 1927   CO2 30 (H) 02/14/2016 0934   BUN 9 02/21/2016 1927   BUN 18.4 02/14/2016 0934  CREATININE 1.02 (H) 02/21/2016 1927   CREATININE 0.9 02/14/2016 0934      Component Value Date/Time   CALCIUM 9.3 02/21/2016 1927   CALCIUM 9.0 02/14/2016 0934   ALKPHOS 121 02/21/2016 1927   ALKPHOS 126 02/14/2016 0934   AST 57 (H) 02/21/2016 1927   AST 30 02/14/2016 0934   ALT 68 (H) 02/21/2016 1927   ALT 42 02/14/2016 0934   BILITOT 1.1 02/21/2016 1927   BILITOT 0.30 02/14/2016 0934       Lab Results  Component Value Date   WBC 16.0 (H) 02/21/2016   HGB 12.8 02/21/2016   HCT 38.7 02/21/2016   MCV 94.2 02/21/2016   PLT 321 02/21/2016   NEUTROABS 5.0 02/14/2016     ASSESSMENT & PLAN:  Breast cancer of lower-outer quadrant of right female breast ([place]) Right mastectomy 08/15/2015: IDC grade 2, 2.2 cm, with associated DCIS intermediate grade, separate focus  high-grade DCIS, 0/4 lymph nodes negative, T2 N0 stage II a, ER 90%, PR 5%, HER-2 negative duration 1.42, Ki-67 60%  Treatment plan: 1. Adjuvant chemotherapy with [place] 2. Followed by adjuvant antiestrogen therapy with anastrozole 5 years No role of radiation since she had mastectomy. ----------------------------------------------------------------------------------------------------------------------- Current treatment: Completed 5 cycles of TCH, chemotherapy discontinued for neuropathy. Patient is on Herceptin maintenance Echocardiogram was reviewed 08/25/2015: EF 65-70%  Acute Diverticulitis: ED visit 02/22/16 Shingles: Significant improvement in trying out  Plan: 1. Continue herceptin q 3 weeks 2. Anastrozole 1 mg daily to start in 3 weeks   Return to clinic every 6 weeks for follow-up and every 3 weeks for Herceptin. No orders of the defined types were placed in this encounter.  The patient has a good understanding of the overall plan. she agrees with it. she will call with any problems that may develop before the next visit here.   Rulon Eisenmenger, MD 02/24/16

## 2016-02-27 ENCOUNTER — Other Ambulatory Visit (HOSPITAL_COMMUNITY): Payer: Self-pay | Admitting: Cardiology

## 2016-02-27 ENCOUNTER — Ambulatory Visit (HOSPITAL_BASED_OUTPATIENT_CLINIC_OR_DEPARTMENT_OTHER)
Admission: RE | Admit: 2016-02-27 | Discharge: 2016-02-27 | Disposition: A | Discharge status: Home / Self Care | Payer: Medicare Other | Source: Ambulatory Visit | Attending: Internal Medicine | Admitting: Internal Medicine

## 2016-02-27 ENCOUNTER — Ambulatory Visit (HOSPITAL_COMMUNITY)
Admission: RE | Admit: 2016-02-27 | Discharge: 2016-02-27 | Disposition: A | Discharge status: Home / Self Care | Payer: Medicare Other | Source: Ambulatory Visit | Attending: Internal Medicine | Admitting: Internal Medicine

## 2016-02-27 VITALS — BP 140/90 | HR 82 | Wt 176.0 lb

## 2016-02-27 DIAGNOSIS — Z803 Family history of malignant neoplasm of breast: Secondary | ICD-10-CM | POA: Insufficient documentation

## 2016-02-27 DIAGNOSIS — I509 Heart failure, unspecified: Secondary | ICD-10-CM | POA: Diagnosis not present

## 2016-02-27 DIAGNOSIS — Z853 Personal history of malignant neoplasm of breast: Secondary | ICD-10-CM | POA: Diagnosis not present

## 2016-02-27 DIAGNOSIS — I10 Essential (primary) hypertension: Secondary | ICD-10-CM

## 2016-02-27 DIAGNOSIS — I110 Hypertensive heart disease with heart failure: Secondary | ICD-10-CM | POA: Diagnosis not present

## 2016-02-27 DIAGNOSIS — E039 Hypothyroidism, unspecified: Secondary | ICD-10-CM | POA: Diagnosis not present

## 2016-02-27 DIAGNOSIS — Z6829 Body mass index (BMI) 29.0-29.9, adult: Secondary | ICD-10-CM | POA: Insufficient documentation

## 2016-02-27 DIAGNOSIS — Z7982 Long term (current) use of aspirin: Secondary | ICD-10-CM | POA: Diagnosis not present

## 2016-02-27 DIAGNOSIS — Z8711 Personal history of peptic ulcer disease: Secondary | ICD-10-CM | POA: Insufficient documentation

## 2016-02-27 DIAGNOSIS — Z885 Allergy status to narcotic agent status: Secondary | ICD-10-CM | POA: Insufficient documentation

## 2016-02-27 DIAGNOSIS — M797 Fibromyalgia: Secondary | ICD-10-CM | POA: Diagnosis not present

## 2016-02-27 DIAGNOSIS — J449 Chronic obstructive pulmonary disease, unspecified: Secondary | ICD-10-CM | POA: Insufficient documentation

## 2016-02-27 DIAGNOSIS — Z811 Family history of alcohol abuse and dependence: Secondary | ICD-10-CM | POA: Insufficient documentation

## 2016-02-27 DIAGNOSIS — Z170 Estrogen receptor positive status [ER+]: Secondary | ICD-10-CM | POA: Diagnosis not present

## 2016-02-27 DIAGNOSIS — Z9109 Other allergy status, other than to drugs and biological substances: Secondary | ICD-10-CM | POA: Insufficient documentation

## 2016-02-27 DIAGNOSIS — E669 Obesity, unspecified: Secondary | ICD-10-CM | POA: Insufficient documentation

## 2016-02-27 DIAGNOSIS — E785 Hyperlipidemia, unspecified: Secondary | ICD-10-CM | POA: Insufficient documentation

## 2016-02-27 DIAGNOSIS — Z8249 Family history of ischemic heart disease and other diseases of the circulatory system: Secondary | ICD-10-CM | POA: Insufficient documentation

## 2016-02-27 DIAGNOSIS — Z9011 Acquired absence of right breast and nipple: Secondary | ICD-10-CM | POA: Insufficient documentation

## 2016-02-27 DIAGNOSIS — E119 Type 2 diabetes mellitus without complications: Secondary | ICD-10-CM | POA: Insufficient documentation

## 2016-02-27 DIAGNOSIS — K219 Gastro-esophageal reflux disease without esophagitis: Secondary | ICD-10-CM | POA: Insufficient documentation

## 2016-02-27 DIAGNOSIS — Z91011 Allergy to milk products: Secondary | ICD-10-CM | POA: Insufficient documentation

## 2016-02-27 DIAGNOSIS — Z9221 Personal history of antineoplastic chemotherapy: Secondary | ICD-10-CM | POA: Diagnosis not present

## 2016-02-27 DIAGNOSIS — C50511 Malignant neoplasm of lower-outer quadrant of right female breast: Secondary | ICD-10-CM

## 2016-02-27 DIAGNOSIS — M1990 Unspecified osteoarthritis, unspecified site: Secondary | ICD-10-CM | POA: Insufficient documentation

## 2016-02-27 DIAGNOSIS — F418 Other specified anxiety disorders: Secondary | ICD-10-CM | POA: Insufficient documentation

## 2016-02-27 DIAGNOSIS — Z800 Family history of malignant neoplasm of digestive organs: Secondary | ICD-10-CM | POA: Insufficient documentation

## 2016-02-27 DIAGNOSIS — Z91041 Radiographic dye allergy status: Secondary | ICD-10-CM | POA: Insufficient documentation

## 2016-02-27 NOTE — Progress Notes (Signed)
*  PRELIMINARY RESULTS* Echocardiogram 2D Echocardiogram has been performed.  Leavy Cella 02/27/2016, 10:07 AM

## 2016-02-27 NOTE — Patient Instructions (Signed)
We will contact you in 3 months to schedule your next appointment and echocardiogram  

## 2016-02-27 NOTE — Progress Notes (Signed)
Patient ID: Tracey Evans, female   DOB: 1945-04-20, 70 y.o.   MRN: [id number]   CARDIO-ONCOLOGY CLINIC Note  Referring Physician: Lindi Adie Primary Care: Birdie Riddle Cardiologist: Terrence Dupont   HPI: 70 y/o woman with h/o obesity, GERD HTN, DM2 and HL referred by Dr. Lindi Adie for enrollment into cardio-oncology clinic.      Breast cancer of lower-outer quadrant of right female breast ([place])   07/06/2015 Initial Diagnosis Right breast biopsy posterior: IDC ER 90%, PR 5%, Ki-67 60%, HER-2 positive ratio 1.42,copy #6.1 T1c N0 stage IA; Right breast biopsy inferior medial: High-grade DCIS with comedonecrosis; ER 100%, PR 90%; 5 mm calcs   08/15/2015 Surgery Right mastectomy: IDC grade 2, 2.2 cm, with associated DCIS intermediate grade, separate focus high-grade DCIS, 0/4 lymph nodes negative, T2 N0 stage II a, ER 90%, PR 5%, HER-2 negative duration 1.42, Ki-67 60%          09/22/2015 -  Chemotherapy    Adjuvant chemotherapy with TCH 6 cycles followed by Herceptin maintenance for 1 year      She returns for follow up. Has finished chemo. Tolerating Herceptin well. Recently had bout of diverticulitis. Now improved. Not watching diet closely.  Denies palpitation, edema, SOB or orthopnea.    Echo 08/25/15: 60-65% lateral s' 10.6 cm/s  GLS -20.4% ECHO 8/04/17/2015: EF 65-70% Lat S' 10.9  GLS -17.6  ECHO 8/04/17/2015: EF 55-60% Lat S' 10.6  GLS -20%    Past Medical History:  Diagnosis Date  . Angina at rest [facility])    "occurs whenever it wants to, but worse during agitation"  . Anxiety   . Asthma   . Binge eating disorder   . Breast cancer of lower-outer quadrant of left female breast ([place]) 07/08/2015   "NEVER HAD LEFT BREAST CANCER" (08/15/2015)  . Cancer of right breast ([place]) 08/15/2015  . Chronic bronchitis ([place])   . Chronic lower back pain   . Complication of anesthesia    had bronc spasms during intubation surgery 2009 on foot-need albuterol inhaler or neb tx preop  . Costochondritis    . Depression   . Diverticular disease   . Fibromyalgia   . GERD (gastroesophageal reflux disease)   . Hot flashes   . Hyperlipidemia   . Hypertension   . Hypothyroidism   . Osteoarthritis    "all over"  . Peptic ulcer disease   . Shortness of breath dyspnea   . Type II diabetes mellitus ([place])    "diet controlled" (08/15/2015)    Current Outpatient Prescriptions  Medication Sig Dispense Refill  . albuterol (PROAIR HFA) 108 (90 BASE) MCG/ACT inhaler Inhale 2 puffs into the lungs every 6 (six) hours as needed for wheezing. 1 Inhaler 3  . ALPRAZolam (XANAX) 1 MG tablet Take 1 tablet (1 mg total) by mouth 2 (two) times daily as needed. for anxiety 60 tablet 3  . amoxicillin-clavulanate (AUGMENTIN) 875-125 MG tablet Take 1 tablet by mouth every 8 (eight) hours. 21 tablet 0  . anastrozole (ARIMIDEX) 1 MG tablet Take 1 tablet (1 mg total) by mouth daily. 90 tablet 3  . aspirin 81 MG tablet Take 81 mg by mouth daily.    Marland Kitchen atorvastatin (LIPITOR) 20 MG tablet TAKE 1 TABLET BY MOUTH EVERY DAY (Patient taking differently: TAKE 1 TABLET BY MOUTH EVERY EVENING) 30 tablet 6  . buPROPion (WELLBUTRIN XL) 300 MG 24 hr tablet TAKE 1 TABLET BY MOUTH EVERY DAY 30 tablet 3  . levothyroxine (SYNTHROID, LEVOTHROID) 75 MCG tablet Take 1  tablet (75 mcg total) by mouth daily. 30 tablet 6  . lidocaine-prilocaine (EMLA) cream Apply to affected area once 30 g 3  . magic mouthwash w/lidocaine SOLN 14m Swish,Swallow, or spit 4 times a day as needed 240 mL 1  . meloxicam (MOBIC) 15 MG tablet Take 15 mg by mouth daily.     . nitroGLYCERIN (NITROSTAT) 0.4 MG SL tablet Place 1 tablet (0.4 mg total) under the tongue every 5 (five) minutes as needed. As needed for chest pain 30 tablet 3  . ondansetron (ZOFRAN ODT) 4 MG disintegrating tablet Take 1 tablet (4 mg total) by mouth every 8 (eight) hours as needed for nausea or vomiting. 20 tablet 0  . Polyethyl Glycol-Propyl Glycol (SYSTANE ULTRA) 0.4-0.3 % SOLN Place 1 drop  into both eyes 3 (three) times daily as needed (for dry eyes).     . prochlorperazine (COMPAZINE) 10 MG tablet Take 1 tablet (10 mg total) by mouth every 6 (six) hours as needed (Nausea or vomiting). 90 tablet 0  . ranitidine (ZANTAC) 150 MG tablet Take 150 mg by mouth 2 (two) times daily as needed for heartburn. Reported on 10/05/2015    . traMADol (ULTRAM) 50 MG tablet Take 50 mg by mouth daily as needed for moderate pain. Reported on 10/05/2015  0  . valACYclovir (VALTREX) 1000 MG tablet Take 1 tablet (1,000 mg total) by mouth 3 (three) times daily. 30 tablet 0   No current facility-administered medications for this encounter.    Facility-Administered Medications Ordered in Other Encounters  Medication Dose Route Frequency Provider Last Rate Last Dose  . sodium chloride flush (NS) 0.9 % injection 10 mL  10 mL Intracatheter PRN VNicholas Lose MD   10 mL at 12/15/15 1439    Allergies  Allergen Reactions  . Contrast Media [Iodinated Diagnostic Agents] Shortness Of Breath    Patient has SOB, tightness in chest and feels like an elephant is sitting on chest, patient should be  scanned at hospital Per Dr. BAlvester Chou . Dilaudid [Hydromorphone Hcl] Anaphylaxis    Pt stopped breathing  . Adhesive [Tape]     blister  . Codeine Nausea Only    insomnia  . Lactose Intolerance (Gi) Diarrhea      Social History   Social History  . Marital status: Single    Spouse name: N/A  . Number of children: N/A  . Years of education: N/A   Occupational History  . Not on file.   Social History Main Topics  . Smoking status: Never Smoker  . Smokeless tobacco: Never Used  . Alcohol use 0.0 oz/week     Comment: 08/15/2015 "1/2 glass of wine q 2 months"   . Drug use: No  . Sexual activity: Not Currently   Other Topics Concern  . Not on file   Social History Narrative  . No narrative on file      Family History  Problem Relation Age of Onset  . Dementia Mother   . Stroke Mother   . Heart Problems  Mother   . Dementia Father   . Alcohol abuse Father   . Colon cancer Father     dx. 771or younger  . Breast cancer Sister 584   inflammatory  . Diverticulitis Sister     maternal half-sister; severe - causing partial colectomy  . Breast cancer Sister     maternal half-sister; dx. early 823s . Breast cancer Other 475   niece  . Breast cancer  Other     niece dx. 38s  . Alcohol abuse Brother     Vitals:   02/27/16 1003  BP: 140/90  Pulse: 82  SpO2: 96%  Weight: 176 lb (79.8 kg)    PHYSICAL EXAM: General:  Well appearing. No respiratory difficulty HEENT: normal Neck: supple. no JVD. Carotids 2+ bilat; no bruits. No lymphadenopathy or thryomegaly appreciated. Cor: PMI nondisplaced. Regular rate & rhythm. No rubs, gallops or murmurs. Lungs: clear Abdomen: obese soft, nontender, nondistended. No hepatosplenomegaly. No bruits or masses. Good bowel sounds. Extremities: no cyanosis, clubbing, rash, minimal edema on right (old fracture)  Neuro: alert & oriented x 3, cranial nerves grossly intact. moves all 4 extremities w/o difficulty. Affect pleasant.   ASSESSMENT & PLAN: 1. Breast cancer of lower-outer quadrant of right female breast ([place]) Right mastectomy 08/15/2015: IDC grade 2, 2.2 cm, with associated DCIS intermediate grade, separate focus high-grade DCIS, 0/4 lymph nodes negative, T2 N0 stage II a, ER 90%, PR 5%, HER-2 negative duration 1.42, Ki-67 60%     --Adjuvant chemotherapy with TCH fx 6 cycles followed by herceptin for 1 year. (finishes 6/18)     --Followed by adjuvant antiestrogen therapy with anastrozole 5 years  I reviewed echos personally. EF and Doppler parameters stable. No HF on exam. Continue Herceptin. Repeat echo 3 months.    2. HTN --BP slightly elevated. She attributes to high-salt diet. She will f/u with PCP.   Nedda Gains,MD

## 2016-02-29 ENCOUNTER — Encounter (HOSPITAL_COMMUNITY): Payer: Self-pay | Admitting: Nurse Practitioner

## 2016-02-29 ENCOUNTER — Emergency Department (HOSPITAL_COMMUNITY)
Admission: EM | Admit: 2016-02-29 | Discharge: 2016-03-01 | Disposition: A | Discharge status: Home / Self Care | Payer: Medicare Other | Attending: Emergency Medicine | Admitting: Emergency Medicine

## 2016-02-29 DIAGNOSIS — E039 Hypothyroidism, unspecified: Secondary | ICD-10-CM | POA: Insufficient documentation

## 2016-02-29 DIAGNOSIS — E119 Type 2 diabetes mellitus without complications: Secondary | ICD-10-CM | POA: Insufficient documentation

## 2016-02-29 DIAGNOSIS — M546 Pain in thoracic spine: Secondary | ICD-10-CM | POA: Insufficient documentation

## 2016-02-29 DIAGNOSIS — J45909 Unspecified asthma, uncomplicated: Secondary | ICD-10-CM | POA: Insufficient documentation

## 2016-02-29 DIAGNOSIS — Z853 Personal history of malignant neoplasm of breast: Secondary | ICD-10-CM | POA: Diagnosis not present

## 2016-02-29 DIAGNOSIS — Y939 Activity, unspecified: Secondary | ICD-10-CM | POA: Insufficient documentation

## 2016-02-29 DIAGNOSIS — Z79899 Other long term (current) drug therapy: Secondary | ICD-10-CM | POA: Insufficient documentation

## 2016-02-29 DIAGNOSIS — M549 Dorsalgia, unspecified: Secondary | ICD-10-CM | POA: Diagnosis present

## 2016-02-29 DIAGNOSIS — I10 Essential (primary) hypertension: Secondary | ICD-10-CM | POA: Insufficient documentation

## 2016-02-29 DIAGNOSIS — Y92410 Unspecified street and highway as the place of occurrence of the external cause: Secondary | ICD-10-CM | POA: Diagnosis not present

## 2016-02-29 DIAGNOSIS — Z7982 Long term (current) use of aspirin: Secondary | ICD-10-CM | POA: Diagnosis not present

## 2016-02-29 DIAGNOSIS — Y999 Unspecified external cause status: Secondary | ICD-10-CM | POA: Insufficient documentation

## 2016-02-29 DIAGNOSIS — S299XXA Unspecified injury of thorax, initial encounter: Secondary | ICD-10-CM | POA: Diagnosis not present
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
## 2016-03-01 ENCOUNTER — Emergency Department (HOSPITAL_COMMUNITY): Payer: Medicare Other

## 2016-03-01 DIAGNOSIS — S299XXA Unspecified injury of thorax, initial encounter: Secondary | ICD-10-CM | POA: Diagnosis not present

## 2016-03-01 DIAGNOSIS — M546 Pain in thoracic spine: Secondary | ICD-10-CM | POA: Diagnosis not present

## 2016-03-01 MED ORDER — NAPROXEN 500 MG PO TABS: 500.0000 mg | ORAL_TABLET | Freq: Once | ORAL | Status: DC

## 2016-03-01 MED ORDER — ACETAMINOPHEN 325 MG PO TABS
650.0000 mg | ORAL_TABLET | Freq: Once | ORAL | Status: AC
  Administered 2016-03-01: 650 mg via ORAL
  Filled 2016-03-01: qty 2
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
## 2016-03-01 NOTE — Discharge Instructions (Signed)
Take extra showing Tylenol every 6 hours. Apply ice to your back for 10-20 minutes 3 times daily. Your primary care physician in one week for persistent symptoms. Return to the ED for worsening pain, numbness, weakness, chest pain, shortness of breath, or any new or concerning symptoms.
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
## 2016-03-05 ENCOUNTER — Ambulatory Visit (INDEPENDENT_AMBULATORY_CARE_PROVIDER_SITE_OTHER): Payer: Medicare Other | Admitting: Family Medicine

## 2016-03-05 ENCOUNTER — Encounter: Payer: Self-pay | Admitting: Family Medicine

## 2016-03-05 VITALS — BP 110/76 | HR 79 | Temp 97.9°F | Resp 16 | Ht 65.0 in | Wt 173.2 lb

## 2016-03-05 DIAGNOSIS — E785 Hyperlipidemia, unspecified: Secondary | ICD-10-CM

## 2016-03-05 DIAGNOSIS — N3281 Overactive bladder: Secondary | ICD-10-CM | POA: Diagnosis not present

## 2016-03-05 DIAGNOSIS — E119 Type 2 diabetes mellitus without complications: Secondary | ICD-10-CM | POA: Diagnosis not present

## 2016-03-05 DIAGNOSIS — E1169 Type 2 diabetes mellitus with other specified complication: Secondary | ICD-10-CM

## 2016-03-05 DIAGNOSIS — G620 Drug-induced polyneuropathy: Secondary | ICD-10-CM

## 2016-03-05 DIAGNOSIS — T451X5A Adverse effect of antineoplastic and immunosuppressive drugs, initial encounter: Secondary | ICD-10-CM

## 2016-03-05 LAB — TSH: TSH: 2.25 u[IU]/mL (ref 0.35–4.50)

## 2016-03-05 LAB — LIPID PANEL
CHOLESTEROL: 150 mg/dL (ref 0–200)
HDL: 41.3 mg/dL (ref >39.00)
LDL Cholesterol: 84 mg/dL (ref 0–99)
NonHDL: 108.88
TRIGLYCERIDES: 125 mg/dL (ref 0.0–149.0)
Total CHOL/HDL Ratio: 4
VLDL: 25 mg/dL (ref 0.0–40.0)

## 2016-03-05 LAB — HEMOGLOBIN A1C: Hgb A1c MFr Bld: 5.9 % (ref 4.6–6.5)

## 2016-03-05 MED ORDER — MIRABEGRON ER 25 MG PO TB24: 25.0000 mg | ORAL_TABLET | Freq: Every day | ORAL | 6 refills | Status: DC

## 2016-03-05 MED ORDER — PREGABALIN 75 MG PO CAPS: 75.0000 mg | ORAL_CAPSULE | Freq: Two times a day (BID) | ORAL | 3 refills | Status: DC

## 2016-03-05 NOTE — Assessment & Plan Note (Signed)
Ongoing issue for pt.  No improvement w/ Gabapentin.  Will attempt Lyrica and monitor for improvement as this is very bothersome for pt.  Pt expressed understanding and is in agreement w/ plan.

## 2016-03-05 NOTE — Assessment & Plan Note (Signed)
Recurrent problem for pt.  She is asking to restart Myrbetriq.  Prescription sent.

## 2016-03-05 NOTE — Progress Notes (Signed)
   Subjective:    Patient ID: Tracey Evans, female    DOB: 1945-06-01, 70 y.o.   MRN: [id number]  HPI DM- chronic problem, currently diet controlled.  Last A1C 6.7  UTD on eye exam, foot exam, UTD on microalbumin.  No CP, SOB, HAs, visual changes, edema.  + chemo neuropathy of hands/feet.  Pt has lost 4 lbs since last visit.  Not exercising.  Hyperlipidemia- chronic problem, on Lipitor 20mg  daily.  Due for lipid panel.  Denies abd pain, N/V.  Chemo neuropathy- bilateral hands, feet.  Having difficulty buttoning clothes, can't feel feet.  Gabapentin did not work 'at all'.     Review of Systems For ROS see HPI     Objective:   Physical Exam  Constitutional: She is oriented to person, place, and time. She appears well-developed and well-nourished. No distress.  HENT:  Head: Normocephalic and atraumatic.  Eyes: Conjunctivae and EOM are normal. Pupils are equal, round, and reactive to light.  Neck: Normal range of motion. Neck supple. No thyromegaly present.  Cardiovascular: Normal rate, regular rhythm, normal heart sounds and intact distal pulses.   No murmur heard. Pulmonary/Chest: Effort normal and breath sounds normal. No respiratory distress.  Abdominal: Soft. She exhibits no distension. There is no tenderness.  Musculoskeletal: She exhibits no edema.  Lymphadenopathy:    She has no cervical adenopathy.  Neurological: She is alert and oriented to person, place, and time.  Skin: Skin is warm and dry.  Psychiatric: She has a normal mood and affect. Her behavior is normal.  Vitals reviewed.         Assessment & Plan:

## 2016-03-05 NOTE — Assessment & Plan Note (Signed)
Chronic problem.  Currently diet controlled.  UTD on foot exam, eye exam, microalbumin.  Currently asymptomatic w/ exception of chemo neuropathy.  Check labs.  Start meds prn

## 2016-03-05 NOTE — Progress Notes (Signed)
Pre visit review using our clinic review tool, if applicable. No additional management support is needed unless otherwise documented below in the visit note. 

## 2016-03-05 NOTE — Assessment & Plan Note (Signed)
Chronic problem, on Lipitor w/o difficulty.  Check labs.  Adjust meds prn  

## 2016-03-05 NOTE — Patient Instructions (Signed)
Follow up in 1 month to recheck the neuropathy We'll notify you of your lab results and make any changes if needed Start the Lyrica twice daily for the neuropathy Restart the Myrbetriq daily for the overactive bladder Continue to work on healthy diet and regular exercise- you can do it!! Call with any questions or concerns Happy Holidays!!!

## 2016-03-08 DIAGNOSIS — M546 Pain in thoracic spine: Secondary | ICD-10-CM | POA: Diagnosis not present

## 2016-03-08 DIAGNOSIS — S134XXA Sprain of ligaments of cervical spine, initial encounter: Secondary | ICD-10-CM | POA: Diagnosis not present

## 2016-03-12 DIAGNOSIS — E785 Hyperlipidemia, unspecified: Secondary | ICD-10-CM | POA: Diagnosis not present

## 2016-03-12 DIAGNOSIS — E669 Obesity, unspecified: Secondary | ICD-10-CM | POA: Diagnosis not present

## 2016-03-12 DIAGNOSIS — E039 Hypothyroidism, unspecified: Secondary | ICD-10-CM | POA: Diagnosis not present

## 2016-03-12 DIAGNOSIS — M1990 Unspecified osteoarthritis, unspecified site: Secondary | ICD-10-CM | POA: Diagnosis not present

## 2016-03-12 DIAGNOSIS — I10 Essential (primary) hypertension: Secondary | ICD-10-CM | POA: Diagnosis not present

## 2016-03-12 DIAGNOSIS — I2510 Atherosclerotic heart disease of native coronary artery without angina pectoris: Secondary | ICD-10-CM | POA: Diagnosis not present

## 2016-03-12 DIAGNOSIS — M797 Fibromyalgia: Secondary | ICD-10-CM | POA: Diagnosis not present

## 2016-03-14 NOTE — Progress Notes (Signed)
Received progress notes from [facility]. Sent to scan

## 2016-03-15 ENCOUNTER — Other Ambulatory Visit: Payer: Self-pay

## 2016-03-15 DIAGNOSIS — C50511 Malignant neoplasm of lower-outer quadrant of right female breast: Secondary | ICD-10-CM

## 2016-03-16 ENCOUNTER — Telehealth: Payer: Self-pay

## 2016-03-16 ENCOUNTER — Other Ambulatory Visit (HOSPITAL_BASED_OUTPATIENT_CLINIC_OR_DEPARTMENT_OTHER): Payer: Medicare Other

## 2016-03-16 ENCOUNTER — Other Ambulatory Visit: Payer: Self-pay | Admitting: Hematology and Oncology

## 2016-03-16 ENCOUNTER — Ambulatory Visit (HOSPITAL_BASED_OUTPATIENT_CLINIC_OR_DEPARTMENT_OTHER): Payer: Medicare Other

## 2016-03-16 VITALS — BP 140/81 | HR 77 | Temp 98.9°F | Resp 16

## 2016-03-16 DIAGNOSIS — C50511 Malignant neoplasm of lower-outer quadrant of right female breast: Secondary | ICD-10-CM | POA: Diagnosis not present

## 2016-03-16 DIAGNOSIS — Z170 Estrogen receptor positive status [ER+]: Principal | ICD-10-CM

## 2016-03-16 DIAGNOSIS — Z5112 Encounter for antineoplastic immunotherapy: Secondary | ICD-10-CM | POA: Diagnosis not present

## 2016-03-16 LAB — CBC & DIFF AND RETIC
BASO%: 0.5 % (ref 0.0–2.0)
BASOS ABS: 0 10*3/uL (ref 0.0–0.1)
EOS%: 6.8 % (ref 0.0–7.0)
Eosinophils Absolute: 0.4 10*3/uL (ref 0.0–0.5)
HCT: 38.1 % (ref 34.8–46.6)
HEMOGLOBIN: 12.3 g/dL (ref 11.6–15.9)
Immature Retic Fract: 5.9 % (ref 1.60–10.00)
LYMPH%: 45.2 % (ref 14.0–49.7)
MCH: 30.7 pg (ref 25.1–34.0)
MCHC: 32.3 g/dL (ref 31.5–36.0)
MCV: 95 fL (ref 79.5–101.0)
MONO#: 0.5 10*3/uL (ref 0.1–0.9)
MONO%: 8.4 % (ref 0.0–14.0)
NEUT#: 2.4 10*3/uL (ref 1.5–6.5)
NEUT%: 39.1 % (ref 38.4–76.8)
NRBC: 0 % (ref 0–0)
Platelets: 272 10*3/uL (ref 145–400)
RBC: 4.01 10*6/uL (ref 3.70–5.45)
RDW: 13.9 % (ref 11.2–14.5)
Retic %: 1.03 % (ref 0.70–2.10)
Retic Ct Abs: 41.3 10*3/uL (ref 33.70–90.70)
WBC: 6.2 10*3/uL (ref 3.9–10.3)
lymph#: 2.8 10*3/uL (ref 0.9–3.3)

## 2016-03-16 LAB — COMPREHENSIVE METABOLIC PANEL
ALBUMIN: 3.3 g/dL — AB (ref 3.5–5.0)
ALK PHOS: 120 U/L (ref 40–150)
ALT: 22 U/L (ref 0–55)
ANION GAP: 8 meq/L (ref 3–11)
AST: 20 U/L (ref 5–34)
BUN: 19.2 mg/dL (ref 7.0–26.0)
CO2: 26 mEq/L (ref 22–29)
Calcium: 9.2 mg/dL (ref 8.4–10.4)
Chloride: 109 mEq/L (ref 98–109)
Creatinine: 1 mg/dL (ref 0.6–1.1)
EGFR: 66 mL/min/{1.73_m2} — AB (ref >90)
GLUCOSE: 94 mg/dL (ref 70–140)
POTASSIUM: 4.4 meq/L (ref 3.5–5.1)
SODIUM: 143 meq/L (ref 136–145)
Total Bilirubin: <0.22 mg/dL (ref 0.20–1.20)
Total Protein: 6.5 g/dL (ref 6.4–8.3)

## 2016-03-16 MED ORDER — DIPHENHYDRAMINE HCL 25 MG PO CAPS
50.0000 mg | ORAL_CAPSULE | Freq: Once | ORAL | Status: AC
  Administered 2016-03-16: 50 mg via ORAL

## 2016-03-16 MED ORDER — ACETAMINOPHEN 325 MG PO TABS
650.0000 mg | ORAL_TABLET | Freq: Once | ORAL | Status: AC
Start: 2016-03-16 — End: 2016-03-16
  Administered 2016-03-16: 650 mg via ORAL

## 2016-03-16 MED ORDER — HEPARIN SOD (PORK) LOCK FLUSH 100 UNIT/ML IV SOLN
500.0000 [IU] | Freq: Once | INTRAVENOUS | Status: AC | PRN
  Administered 2016-03-16: 500 [IU]
  Filled 2016-03-16: qty 5

## 2016-03-16 MED ORDER — SODIUM CHLORIDE 0.9% FLUSH
10.0000 mL | INTRAVENOUS | Status: DC | PRN
  Administered 2016-03-16: 10 mL
  Filled 2016-03-16: qty 10

## 2016-03-16 MED ORDER — DIPHENHYDRAMINE HCL 25 MG PO CAPS
ORAL_CAPSULE | ORAL | Status: AC
  Filled 2016-03-16: qty 2

## 2016-03-16 MED ORDER — SODIUM CHLORIDE 0.9 % IV SOLN
Freq: Once | INTRAVENOUS | Status: AC
  Administered 2016-03-16: 12:00:00 via INTRAVENOUS

## 2016-03-16 MED ORDER — ACETAMINOPHEN 325 MG PO TABS
ORAL_TABLET | ORAL | Status: AC
  Filled 2016-03-16: qty 2

## 2016-03-16 MED ORDER — TRASTUZUMAB CHEMO 150 MG IV SOLR
6.0000 mg/kg | Freq: Once | INTRAVENOUS | Status: AC
  Administered 2016-03-16: 483 mg via INTRAVENOUS
  Filled 2016-03-16: qty 23

## 2016-03-16 NOTE — Telephone Encounter (Signed)
Daughter called to let us know pt is on her way. She lives about 10 minutes away. She overslept. Let infusion room know.

## 2016-03-16 NOTE — Patient Instructions (Signed)
Cancer Center Discharge Instructions for Patients Receiving Chemotherapy  Today you received the following chemotherapy agents:  Herceptin  To help prevent nausea and vomiting after your treatment, we encourage you to take your nausea medication as prescribed.   If you develop nausea and vomiting that is not controlled by your nausea medication, call the clinic.   BELOW ARE SYMPTOMS THAT SHOULD BE REPORTED IMMEDIATELY:  *FEVER GREATER THAN 100.5 F  *CHILLS WITH OR WITHOUT FEVER  NAUSEA AND VOMITING THAT IS NOT CONTROLLED WITH YOUR NAUSEA MEDICATION  *UNUSUAL SHORTNESS OF BREATH  *UNUSUAL BRUISING OR BLEEDING  TENDERNESS IN MOUTH AND THROAT WITH OR WITHOUT PRESENCE OF ULCERS  *URINARY PROBLEMS  *BOWEL PROBLEMS  UNUSUAL RASH Items with * indicate a potential emergency and should be followed up as soon as possible.  Feel free to call the clinic you have any questions or concerns. The clinic phone number is [phone].  Please show the CHEMO ALERT CARD at check-in to the Emergency Department and triage nurse.   

## 2016-03-21 ENCOUNTER — Encounter: Payer: Self-pay | Admitting: Family Medicine

## 2016-03-31 ENCOUNTER — Other Ambulatory Visit: Payer: Self-pay | Admitting: Family Medicine

## 2016-04-04 ENCOUNTER — Telehealth: Payer: Self-pay | Admitting: *Deleted

## 2016-04-04 ENCOUNTER — Encounter: Payer: Self-pay | Admitting: Family Medicine

## 2016-04-04 ENCOUNTER — Ambulatory Visit (INDEPENDENT_AMBULATORY_CARE_PROVIDER_SITE_OTHER): Payer: Medicare Other | Admitting: Family Medicine

## 2016-04-04 VITALS — BP 124/84 | HR 90 | Temp 98.0°F | Resp 16 | Ht 65.0 in | Wt 176.4 lb

## 2016-04-04 DIAGNOSIS — T451X5A Adverse effect of antineoplastic and immunosuppressive drugs, initial encounter: Secondary | ICD-10-CM

## 2016-04-04 DIAGNOSIS — F331 Major depressive disorder, recurrent, moderate: Secondary | ICD-10-CM | POA: Diagnosis not present

## 2016-04-04 DIAGNOSIS — G620 Drug-induced polyneuropathy: Secondary | ICD-10-CM

## 2016-04-04 NOTE — Progress Notes (Signed)
   Subjective:    Patient ID: Tracey Evans, female    DOB: 1945/11/02, 70 y.o.   MRN: [id number]  HPI Neuropathy- ongoing issue for pt.  Started on Lyrica at last visit.  Had to stop the Lyrica b/c of extreme confusion.  Confusion improved after stopping Lyrica.  Pt had no relief on Gabapentin- not interested in retrying this.  Pt will frequently drop things b/c of neuropathy in hands.  'i just deal w/ it'.  Depression- pt reports she is getting 'very very depressed'.  Has no motivation.  Hx of depression btwn Thanksgiving and New Years for many years.  Pt is not interested in adding a new medication- 'it gets better after New Years'.     Review of Systems For ROS see HPI     Objective:   Physical Exam  Constitutional: She is oriented to person, place, and time. She appears well-developed and well-nourished. No distress.  HENT:  Head: Normocephalic and atraumatic.  Neurological: She is alert and oriented to person, place, and time.  Skin: Skin is warm and dry.  Psychiatric: She has a normal mood and affect. Her behavior is normal. Thought content normal.  Vitals reviewed.         Assessment & Plan:

## 2016-04-04 NOTE — Patient Instructions (Signed)
Schedule your complete physical for after 4/17 Let me know if the mood doesn't improve so we can make some changes Call with any questions or concerns [place]!!!

## 2016-04-04 NOTE — Assessment & Plan Note (Signed)
Chronic and recurring problem for pt.  She has hx of seasonal depression, 'it's always bad between Thanksgiving and New Year's'.  She denies SI/HI.  Not interested in medication changes at this time.  Will follow closely.

## 2016-04-04 NOTE — Telephone Encounter (Signed)
This RN spoke with pt per her call stating she was seen this AM by her primary MD - Dr Birdie Riddle and was advised to contact Dr Lindi Adie due to her ongoing neuropathy.  Per above pt was placed on Lyrica which " made me go crazy - I was not remembering and like when I would go to drive I didn't know which pedal to push and then would just zone out "  Pt has been off the Lyrica for 2 weeks now and is " feeling more like myself "  Tracey Evans states she has nail issues with " peeling, splitting, and like bent "  The neuropathy improves during the day " seems to be better with movement but then when I go to bed at night my legs and feet feel like a 100 lbs each and then because of how they feel I have a hard time going to sleep"  This RN discussed above in relation to post chemo recovery.  Pt also stated " then I feel like I am sleeping too much "- she states she sleep 12 hours a day " and like it doesn't matter because I am not working and do not have kids so why can I not just sleep "  Per conversation and inquiry if pt feels she could be depressed - Tracey Evans stated " well yes I do feel kinda depressed because I thought I would be getting better but I seem to still be having problems since having the chemo"  This RN validated pt's feelings and current symptoms as well as discussed benefits of needing to regulate her sleep and have planned activities. Discussed support groups and classes offered by this office.  Tracey Evans states she is not a " support group person- just do not like opening up with a lot of people "  Plan per above is pt will come in Friday as scheduled for Herceptin - this RN will request for Abby Potash to speak with pt (she is known by her ) as well as this note will be given to MD upon his return to the office for any additional recommendations for her neuropathy.

## 2016-04-04 NOTE — Progress Notes (Signed)
Pre visit review using our clinic review tool, if applicable. No additional management support is needed unless otherwise documented below in the visit note. 

## 2016-04-04 NOTE — Assessment & Plan Note (Signed)
Chronic problem.  Had extreme confusion while taking the Lyrica and had to stop the medication.  She reports Gabapentin was ineffective and she is not interested in restarting Cymbalta at this time.  She is going to ask Oncology about their thoughts on her chemo-induced neuropathy and what the next steps will be.  Will follow along and assist as able.

## 2016-04-05 ENCOUNTER — Other Ambulatory Visit: Payer: Self-pay | Admitting: *Deleted

## 2016-04-05 DIAGNOSIS — M546 Pain in thoracic spine: Secondary | ICD-10-CM | POA: Diagnosis not present

## 2016-04-05 DIAGNOSIS — S134XXD Sprain of ligaments of cervical spine, subsequent encounter: Secondary | ICD-10-CM | POA: Diagnosis not present

## 2016-04-05 DIAGNOSIS — M47816 Spondylosis without myelopathy or radiculopathy, lumbar region: Secondary | ICD-10-CM | POA: Diagnosis not present

## 2016-04-05 DIAGNOSIS — C50511 Malignant neoplasm of lower-outer quadrant of right female breast: Secondary | ICD-10-CM

## 2016-04-06 ENCOUNTER — Other Ambulatory Visit (HOSPITAL_BASED_OUTPATIENT_CLINIC_OR_DEPARTMENT_OTHER): Payer: Medicare Other

## 2016-04-06 ENCOUNTER — Encounter: Payer: Self-pay | Admitting: *Deleted

## 2016-04-06 ENCOUNTER — Ambulatory Visit (HOSPITAL_BASED_OUTPATIENT_CLINIC_OR_DEPARTMENT_OTHER): Payer: Medicare Other

## 2016-04-06 VITALS — BP 117/84 | HR 84 | Temp 98.1°F | Resp 16

## 2016-04-06 DIAGNOSIS — Z5112 Encounter for antineoplastic immunotherapy: Secondary | ICD-10-CM | POA: Diagnosis not present

## 2016-04-06 DIAGNOSIS — C50511 Malignant neoplasm of lower-outer quadrant of right female breast: Secondary | ICD-10-CM

## 2016-04-06 DIAGNOSIS — Z170 Estrogen receptor positive status [ER+]: Principal | ICD-10-CM

## 2016-04-06 LAB — CBC WITH DIFFERENTIAL/PLATELET
BASO%: 0.8 % (ref 0.0–2.0)
Basophils Absolute: 0 10*3/uL (ref 0.0–0.1)
EOS%: 6.4 % (ref 0.0–7.0)
Eosinophils Absolute: 0.3 10*3/uL (ref 0.0–0.5)
HCT: 38.9 % (ref 34.8–46.6)
HGB: 12.8 g/dL (ref 11.6–15.9)
LYMPH%: 41.1 % (ref 14.0–49.7)
MCH: 30.7 pg (ref 25.1–34.0)
MCHC: 32.9 g/dL (ref 31.5–36.0)
MCV: 93.5 fL (ref 79.5–101.0)
MONO#: 0.5 10*3/uL (ref 0.1–0.9)
MONO%: 8.9 % (ref 0.0–14.0)
NEUT%: 42.8 % (ref 38.4–76.8)
NEUTROS ABS: 2.2 10*3/uL (ref 1.5–6.5)
Platelets: 272 10*3/uL (ref 145–400)
RBC: 4.15 10*6/uL (ref 3.70–5.45)
RDW: 14.4 % (ref 11.2–14.5)
WBC: 5.2 10*3/uL (ref 3.9–10.3)
lymph#: 2.1 10*3/uL (ref 0.9–3.3)

## 2016-04-06 LAB — COMPREHENSIVE METABOLIC PANEL
ALT: 16 U/L (ref 0–55)
AST: 16 U/L (ref 5–34)
Albumin: 3.8 g/dL (ref 3.5–5.0)
Alkaline Phosphatase: 120 U/L (ref 40–150)
Anion Gap: 8 mEq/L (ref 3–11)
BILIRUBIN TOTAL: 0.48 mg/dL (ref 0.20–1.20)
BUN: 16.9 mg/dL (ref 7.0–26.0)
CHLORIDE: 109 meq/L (ref 98–109)
CO2: 27 meq/L (ref 22–29)
CREATININE: 1.1 mg/dL (ref 0.6–1.1)
Calcium: 9.9 mg/dL (ref 8.4–10.4)
EGFR: 59 mL/min/{1.73_m2} — ABNORMAL LOW (ref >90)
Glucose: 95 mg/dl (ref 70–140)
Potassium: 4.6 mEq/L (ref 3.5–5.1)
SODIUM: 144 meq/L (ref 136–145)
TOTAL PROTEIN: 6.9 g/dL (ref 6.4–8.3)

## 2016-04-06 MED ORDER — ACETAMINOPHEN 325 MG PO TABS
650.0000 mg | ORAL_TABLET | Freq: Once | ORAL | Status: AC
  Administered 2016-04-06: 650 mg via ORAL

## 2016-04-06 MED ORDER — TRASTUZUMAB CHEMO 150 MG IV SOLR
6.0000 mg/kg | Freq: Once | INTRAVENOUS | Status: AC
  Administered 2016-04-06: 483 mg via INTRAVENOUS
  Filled 2016-04-06: qty 23

## 2016-04-06 MED ORDER — SODIUM CHLORIDE 0.9 % IV SOLN
Freq: Once | INTRAVENOUS | Status: AC
  Administered 2016-04-06: 10:00:00 via INTRAVENOUS

## 2016-04-06 MED ORDER — HEPARIN SOD (PORK) LOCK FLUSH 100 UNIT/ML IV SOLN
500.0000 [IU] | Freq: Once | INTRAVENOUS | Status: AC | PRN
  Administered 2016-04-06: 500 [IU]
  Filled 2016-04-06: qty 5

## 2016-04-06 MED ORDER — DIPHENHYDRAMINE HCL 25 MG PO CAPS
50.0000 mg | ORAL_CAPSULE | Freq: Once | ORAL | Status: AC
  Administered 2016-04-06: 50 mg via ORAL

## 2016-04-06 MED ORDER — ACETAMINOPHEN 325 MG PO TABS
ORAL_TABLET | ORAL | Status: AC
  Filled 2016-04-06: qty 2

## 2016-04-06 MED ORDER — DIPHENHYDRAMINE HCL 25 MG PO CAPS
ORAL_CAPSULE | ORAL | Status: AC
  Filled 2016-04-06: qty 2

## 2016-04-06 MED ORDER — SODIUM CHLORIDE 0.9% FLUSH
10.0000 mL | INTRAVENOUS | Status: DC | PRN
  Administered 2016-04-06: 10 mL
  Filled 2016-04-06: qty 10

## 2016-04-06 NOTE — Patient Instructions (Signed)
[facility] Discharge Instructions for Patients Receiving Chemotherapy  Today you received the following chemotherapy agents: Herceptin   To help prevent nausea and vomiting after your treatment, we encourage you to take your nausea medication as directed.    If you develop nausea and vomiting that is not controlled by your nausea medication, call the clinic.   BELOW ARE SYMPTOMS THAT SHOULD BE REPORTED IMMEDIATELY:  *FEVER GREATER THAN 100.5 F  *CHILLS WITH OR WITHOUT FEVER  NAUSEA AND VOMITING THAT IS NOT CONTROLLED WITH YOUR NAUSEA MEDICATION  *UNUSUAL SHORTNESS OF BREATH  *UNUSUAL BRUISING OR BLEEDING  TENDERNESS IN MOUTH AND THROAT WITH OR WITHOUT PRESENCE OF ULCERS  *URINARY PROBLEMS  *BOWEL PROBLEMS  UNUSUAL RASH Items with * indicate a potential emergency and should be followed up as soon as possible.  Feel free to call the clinic you have any questions or concerns. The clinic phone number is [phone].  Please show the CHEMO ALERT CARD at check-in to the Emergency Department and triage nurse.   

## 2016-04-06 NOTE — Progress Notes (Signed)
[place] Work  Clinical Social Work was referred by nurse for assessment of psychosocial needs due to possible depression and ongoing adjustment to illness concerns.  Clinical Social Worker met with patient at [facility] at length during her herceptin to offer support and assess for needs. CSW has worked with pt several times in the past. Pt shared she has a "rough time" during the winter with depression, but also has been having some relationship issues with a current boyfriend. Pt has contemplated ending relationship, but has not broken with boyfriend to date. There are no safety concerns noted, however, this has been a stressor.   Pt is really struggling with her neuropathy side effects and really "has not felt like doing anything" as a result. This appears to be a cycle with the added h/o depression and maybe compounding the situation. CSW reviewed additional support options, [employer], [employer] program, etc. Pt is interested in the weaving class at [facility] and will consider attending as she likes to knit. Pt shared she has been trying to walk, but just has had no energy. She also shared this is difficult as her feet are "wobly". Pt attended PT in the past and could possibly benefit from additional PT to help get her stronger. Pt plans to inquire at her next visit with MD. Pt also encouraged to continue to seek CSW support. CSW to follow more closely and reassess needs in the coming weeks.      Clinical Social Work interventions: Supportive Psychiatric nurse education and referral  Loren Racer, [place] Worker [place]  [place] Phone: [phone] Fax: [phone]

## 2016-04-16 ENCOUNTER — Telehealth: Payer: Self-pay | Admitting: *Deleted

## 2016-04-16 NOTE — Telephone Encounter (Signed)
"  My stomach hurts in one spot under the breast bone.  I started throwing up yesterday and could not keep anything down.  I'm afraid to take medicines or eat today.  After church I ate a peanut butter and jelly sandwich, about [id number] pm I ate a slice of cheese pizza.  I'm not constipated, had a soft formed stool.  I threw up my medicines.  It looked and smelled like curdles peanut butter.  Whenever I tried to drink, I threw up.  Return number [phone]."     Last Herceptin was 04-06-2016.

## 2016-04-16 NOTE — Telephone Encounter (Signed)
Returned patient's call. Patient denies any vomiting or nausea today. Denies any fever. States she is hungry; and complains of epigastric pain. States it feels like she has a hole in her stomach. Patient states she did take an antinausea pill this am. Patient advised to go ahead and take a Zantac now and to eat a bland diet today. Advised patient to call back if symptoms worsen or continue. Patient verbalized understanding.

## 2016-04-18 ENCOUNTER — Other Ambulatory Visit: Payer: Self-pay | Admitting: Family Medicine

## 2016-04-18 NOTE — Telephone Encounter (Signed)
Last OV 04/04/16 Alprazolam last filled 10/24/15 #60 with 3  CSC, UDS low risk next due in Feb. 2018

## 2016-04-24 ENCOUNTER — Telehealth: Payer: Self-pay

## 2016-04-24 NOTE — Assessment & Plan Note (Signed)
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 Adjuvant chemotherapy with [place] 2. Followed by adjuvant antiestrogen therapy with anastrozole 5 years No role of radiation since she had mastectomy. ----------------------------------------------------------------------------------------------------------------------- Current treatment: Completed 5 cycles ofTCH, chemotherapy discontinued for neuropathy. Patient is on Herceptin maintenance Echocardiogram was reviewed 08/25/2015: EF 65-70%  Acute Diverticulitis: ED visit 02/22/16 Shingles: Significant improvement in trying out  Plan: 1. Continue herceptin q 3 weeks 2. 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 Recommended Neurology Referral. Plan on giving calcium-Magn IV Return to clinic every 6 weeks for follow-up and every 3 weeks for Herceptin. No orders of the defined types were placed in this encounter.

## 2016-04-24 NOTE — Telephone Encounter (Signed)
Called pt to return her vm regarding neuropathy getting worse. Pt states that her R ankle and foot is now numb and can't feel anything. Her left toe and is also going numb. Pt states that she has been dealing with neuropathy for about 4.52months now and is not able to tolerate the pain anymore. Pt fingers are affected as well and is having difficulty buttoning up shirts and handling dishes. Pt states that it is making her very depressed and that she is unable to have any feeling anymore. Pt stated that she had done research online about anastrozole and found that it can cause neuropathy as well. Pt states that her neuropathy symptoms slowly progressed since starting this medication. Pt not on gabapentin (doesn't help) and lyrica (caused a lot of side effects and unable to tolerate.) Pt takes pain medicine to help with the numbness and tingling pain but is still unable to sleep well at night. Pt does live alone and is very upset, and depressed. Pt speaks with Abby Potash from [facility] all the time to vent her feelings. Pt to come in for herceptin treatment on Friday. Will schedule pt to see Dr.Gudena tomorrow and to discuss possible solutions for her symptoms. Pt agrees to plan and will come in tomorrow at 9:15am.

## 2016-04-25 ENCOUNTER — Ambulatory Visit (HOSPITAL_BASED_OUTPATIENT_CLINIC_OR_DEPARTMENT_OTHER): Payer: Medicare Other | Admitting: Hematology and Oncology

## 2016-04-25 ENCOUNTER — Ambulatory Visit: Payer: Medicare Other | Admitting: Family Medicine

## 2016-04-25 ENCOUNTER — Encounter: Payer: Self-pay | Admitting: Hematology and Oncology

## 2016-04-25 VITALS — BP 152/97 | HR 87 | Temp 97.7°F | Resp 18 | Wt 177.9 lb

## 2016-04-25 DIAGNOSIS — G620 Drug-induced polyneuropathy: Secondary | ICD-10-CM

## 2016-04-25 DIAGNOSIS — T451X5A Adverse effect of antineoplastic and immunosuppressive drugs, initial encounter: Principal | ICD-10-CM

## 2016-04-25 DIAGNOSIS — Z170 Estrogen receptor positive status [ER+]: Secondary | ICD-10-CM

## 2016-04-25 DIAGNOSIS — C50511 Malignant neoplasm of lower-outer quadrant of right female breast: Secondary | ICD-10-CM | POA: Diagnosis not present

## 2016-04-25 MED ORDER — TAMOXIFEN CITRATE 20 MG PO TABS: 20.0000 mg | ORAL_TABLET | Freq: Every day | ORAL | 3 refills | Status: DC

## 2016-04-25 NOTE — Progress Notes (Signed)
Patient Care Team: Midge Minium, MD as PCP - General [name], MD as Consulting Physician (Cardiology) Marylynn Pearson, MD as Consulting Physician (Ophthalmology) Nicholas Lose, MD as Consulting Physician (Hematology and Oncology) Kyung Rudd, MD as Consulting Physician (Radiation Oncology) Fanny Skates, MD as Consulting Physician (General Surgery)  DIAGNOSIS:  Encounter Diagnoses  Name Primary?  . Chemotherapy-induced peripheral neuropathy ([place]) Yes  . 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 We had to cut the chemotherapy short because of neuropathy. Tracey Evans is here today for an urgent visit because of continuing worsening symptoms of neuropathy. The pain is constant and she can't feel the bottom of her feet and able to button her shirt. She is very depressed because of  all of these. She has been speaking with our Education officer, museum and case managers. She previously failed Neurontin and Lyrica.  REVIEW OF SYSTEMS:   Constitutional: Denies fevers, chills or abnormal weight loss Eyes: Denies blurriness of vision Ears, nose, mouth, throat, and face: Denies mucositis or sore throat Respiratory: Denies cough, dyspnea or wheezes Cardiovascular: Denies palpitation, chest discomfort Gastrointestinal:  Denies nausea, heartburn or change in bowel habits Skin: Denies abnormal skin rashes Lymphatics: Denies new lymphadenopathy or easy bruising Neurological:profound neuropathy Behavioral/Psych: Mood is stable, no new changes  Extremities: No lower extremity edema Breast:  denies any pain or lumps or nodules in either breasts All other systems were reviewed with the patient and are negative.  I have reviewed the past medical history, past surgical history, social history and family history with the patient and they are unchanged from previous note.  ALLERGIES:  is allergic to contrast media [iodinated diagnostic agents]; dilaudid [hydromorphone hcl]; adhesive [tape]; codeine; and lactose intolerance (gi).  MEDICATIONS:  Current Outpatient Prescriptions  Medication Sig Dispense Refill  . albuterol (PROAIR HFA) 108 (90 BASE) MCG/ACT inhaler Inhale 2 puffs into the lungs every 6 (six) hours as needed for wheezing. 1 Inhaler 3  . ALPRAZolam (XANAX) 1 MG tablet TAKE 1 TABLET BY MOUTH TWICE A DAY AS NEEDED FOR ANXIETY 60 tablet 1  . aspirin 81 MG tablet Take 81 mg by mouth daily.    Marland Kitchen atorvastatin (LIPITOR) 20 MG tablet TAKE 1 TABLET BY MOUTH EVERY DAY (Patient taking differently: TAKE 1 TABLET BY MOUTH EVERY EVENING) 30 tablet 6  . buPROPion (WELLBUTRIN XL) 300 MG 24 hr tablet  TAKE 1 TABLET BY MOUTH EVERY DAY 30 tablet 3  . levothyroxine (SYNTHROID, LEVOTHROID) 75 MCG tablet Take 1 tablet (75 mcg total) by mouth daily. 30 tablet 6  . lidocaine-prilocaine (EMLA) cream Apply to  affected area once 30 g 3  . magic mouthwash w/lidocaine SOLN 93m Swish,Swallow, or spit 4 times a day as needed 240 mL 1  . meloxicam (MOBIC) 15 MG tablet Take 15 mg by mouth daily.     . mirabegron ER (MYRBETRIQ) 25 MG TB24 tablet Take 1 tablet (25 mg total) by mouth daily. 30 tablet 6  . nitroGLYCERIN (NITROSTAT) 0.4 MG SL tablet Place 1 tablet (0.4 mg total) under the tongue every 5 (five) minutes as needed. As needed for chest pain 30 tablet 3  . ondansetron (ZOFRAN ODT) 4 MG disintegrating tablet Take 1 tablet (4 mg total) by mouth every 8 (eight) hours as needed for nausea or vomiting. 20 tablet 0  . Polyethyl Glycol-Propyl Glycol (SYSTANE ULTRA) 0.4-0.3 % SOLN Place 1 drop into both eyes 3 (three) times daily as needed (for dry eyes).     . prochlorperazine (COMPAZINE) 10 MG tablet Take 1 tablet (10 mg total) by mouth every 6 (six) hours as needed (Nausea or vomiting). 90 tablet 0  . ranitidine (ZANTAC) 150 MG tablet Take 150 mg by mouth 2 (two) times daily as needed for heartburn. Reported on 10/05/2015    . tamoxifen (NOLVADEX) 20 MG tablet Take 1 tablet (20 mg total) by mouth daily. 90 tablet 3  . traMADol (ULTRAM) 50 MG tablet Take 50 mg by mouth daily as needed for moderate pain. Reported on 10/05/2015  0  . valACYclovir (VALTREX) 1000 MG tablet Take 1 tablet (1,000 mg total) by mouth 3 (three) times daily. 30 tablet 0   No current facility-administered medications for this visit.    Facility-Administered Medications Ordered in Other Visits  Medication Dose Route Frequency Provider Last Rate Last Dose  . sodium chloride flush (NS) 0.9 % injection 10 mL  10 mL Intracatheter PRN VNicholas Lose MD   10 mL at 12/15/15 1439    PHYSICAL EXAMINATION: ECOG PERFORMANCE STATUS: 1 - Symptomatic but completely ambulatory  Vitals:   04/25/16 0856  BP: (!) 152/97  Pulse: 87  Resp: 18  Temp: 97.7 F (36.5 C)   Filed Weights   04/25/16 0856  Weight: 177 lb 14.4 oz (80.7 kg)     GENERAL:alert, no distress and comfortable SKIN: skin color, texture, turgor are normal, no rashes or significant lesions EYES: normal, Conjunctiva are pink and non-injected, sclera clear OROPHARYNX:no exudate, no erythema and lips, buccal mucosa, and tongue normal  NECK: supple, thyroid normal size, non-tender, without nodularity LYMPH:  no palpable lymphadenopathy in the cervical, axillary or inguinal LUNGS: clear to auscultation and percussion with normal breathing effort HEART: regular rate & rhythm and no murmurs and no lower extremity edema ABDOMEN:abdomen soft, non-tender and normal bowel sounds MUSCULOSKELETAL:no cyanosis of digits and no clubbing  NEURO: alert & oriented x 3 with fluent speech, no focal motor/sensory deficits EXTREMITIES: No lower extremity edema  LABORATORY DATA:  I have reviewed the data as listed   Chemistry      Component Value Date/Time   NA 144 04/06/2016 0934   K 4.6 04/06/2016 0934   CL 103 02/21/2016 1927   CO2 27 04/06/2016 0934   BUN 16.9 04/06/2016 0934   CREATININE 1.1 04/06/2016 0934      Component Value Date/Time   CALCIUM 9.9 04/06/2016 0934  ALKPHOS 120 04/06/2016 0934   AST 16 04/06/2016 0934   ALT 16 04/06/2016 0934   BILITOT 0.48 04/06/2016 0934       Lab Results  Component Value Date   WBC 5.2 04/06/2016   HGB 12.8 04/06/2016   HCT 38.9 04/06/2016   MCV 93.5 04/06/2016   PLT 272 04/06/2016   NEUTROABS 2.2 04/06/2016    ASSESSMENT & PLAN:  Breast cancer of lower-outer quadrant of right female breast ([place]) Right mastectomy 08/15/2015: IDC grade 2, 2.2 cm, with associated DCIS intermediate grade, separate focus high-grade DCIS, 0/4 lymph nodes negative, T2 N0 stage II a, ER 90%, PR 5%, HER-2 negative duration 1.42, Ki-67 60%  Treatment plan: 1. Adjuvant chemotherapy with [place] 2. Followed by adjuvant antiestrogen therapy with anastrozole 5 years No role of radiation since she had  mastectomy. ----------------------------------------------------------------------------------------------------------------------- Current treatment: Completed 5 cycles ofTCH, chemotherapy discontinued for neuropathy. Patient is on Herceptin maintenance Echocardiogram was reviewed 08/25/2015: EF 65-70%  Acute Diverticulitis: ED visit 02/22/16 Shingles: Significant improvement in trying out  Plan: 1. Continue herceptin q 3 weeks 2. Anastrozole 1 mg daily started in 03/15/16 discontinued 04/25/2016 because of pain in hands and feet which patient attributes it to anastrozole  I switched her to tamoxifen therapy.  Chemo induced peripheral Neuropathy: Quite severe.  Profound disability. Plan on giving calcium-Mag in IV with her Herceptin We'll start the patient on gabapentin I also discussed physical therapy referral Also recommended TENS unit  Return to clinic every 6 weeks for follow-up and every 3 weeks for Herceptin. No orders of the defined types were placed in this encounter.  I spent 25 minutes talking to the patient of which more than half was spent in counseling and coordination of care.  No orders of the defined types were placed in this encounter.  The patient has a good understanding of the overall plan. she agrees with it. she will call with any problems that may develop before the next visit here.   Rulon Eisenmenger, MD 04/25/16

## 2016-04-27 ENCOUNTER — Encounter: Payer: Medicare Other | Admitting: Nurse Practitioner

## 2016-04-27 ENCOUNTER — Other Ambulatory Visit: Payer: Medicare Other

## 2016-04-27 ENCOUNTER — Ambulatory Visit (HOSPITAL_BASED_OUTPATIENT_CLINIC_OR_DEPARTMENT_OTHER): Payer: Medicare Other

## 2016-04-27 VITALS — BP 150/81 | HR 69 | Temp 97.9°F | Resp 18

## 2016-04-27 DIAGNOSIS — C50511 Malignant neoplasm of lower-outer quadrant of right female breast: Secondary | ICD-10-CM

## 2016-04-27 DIAGNOSIS — Z170 Estrogen receptor positive status [ER+]: Principal | ICD-10-CM

## 2016-04-27 DIAGNOSIS — Z5112 Encounter for antineoplastic immunotherapy: Secondary | ICD-10-CM

## 2016-04-27 MED ORDER — SODIUM CHLORIDE 0.9 % IV SOLN
Freq: Once | INTRAVENOUS | Status: AC
  Administered 2016-04-27: 10:00:00 via INTRAVENOUS

## 2016-04-27 MED ORDER — SODIUM CHLORIDE 0.9 % IV SOLN
Freq: Once | INTRAVENOUS | Status: AC
  Administered 2016-04-27: 11:00:00 via INTRAVENOUS
  Filled 2016-04-27: qty 100

## 2016-04-27 MED ORDER — CALCIUM GLUCONATE 10 % IV SOLN: 1.0000 g | Freq: Once | INTRAVENOUS | Status: DC

## 2016-04-27 MED ORDER — SODIUM CHLORIDE 0.9 % IV SOLN
2.0000 g | Freq: Once | INTRAVENOUS | Status: DC
  Filled 2016-04-27: qty 4

## 2016-04-27 MED ORDER — ACETAMINOPHEN 325 MG PO TABS
650.0000 mg | ORAL_TABLET | Freq: Once | ORAL | Status: AC
  Administered 2016-04-27: 650 mg via ORAL

## 2016-04-27 MED ORDER — SODIUM CHLORIDE 0.9% FLUSH
10.0000 mL | INTRAVENOUS | Status: DC | PRN
  Administered 2016-04-27: 10 mL
  Filled 2016-04-27: qty 10

## 2016-04-27 MED ORDER — HEPARIN SOD (PORK) LOCK FLUSH 100 UNIT/ML IV SOLN
500.0000 [IU] | Freq: Once | INTRAVENOUS | Status: AC | PRN
  Administered 2016-04-27: 500 [IU]
  Filled 2016-04-27: qty 5

## 2016-04-27 MED ORDER — DIPHENHYDRAMINE HCL 25 MG PO CAPS
50.0000 mg | ORAL_CAPSULE | Freq: Once | ORAL | Status: AC
  Administered 2016-04-27: 50 mg via ORAL

## 2016-04-27 MED ORDER — DIPHENHYDRAMINE HCL 25 MG PO CAPS
ORAL_CAPSULE | ORAL | Status: AC
  Filled 2016-04-27: qty 1

## 2016-04-27 MED ORDER — TRASTUZUMAB CHEMO 150 MG IV SOLR
6.0000 mg/kg | Freq: Once | INTRAVENOUS | Status: AC
  Administered 2016-04-27: 483 mg via INTRAVENOUS
  Filled 2016-04-27: qty 23

## 2016-04-27 MED ORDER — ACETAMINOPHEN 325 MG PO TABS
ORAL_TABLET | ORAL | Status: AC
  Filled 2016-04-27: qty 2

## 2016-04-27 NOTE — Progress Notes (Signed)
No lab requirements per Dr.Gudena for pt infusion orders today. Infusion RN aware.

## 2016-04-27 NOTE — Patient Instructions (Addendum)
[facility] Discharge Instructions for Patients Receiving Chemotherapy  Today you received the following chemotherapy agents: Herceptin.  To help prevent nausea and vomiting after your treatment, we encourage you to take your nausea medication as directed.    If you develop nausea and vomiting that is not controlled by your nausea medication, call the clinic.   BELOW ARE SYMPTOMS THAT SHOULD BE REPORTED IMMEDIATELY:  *FEVER GREATER THAN 100.5 F  *CHILLS WITH OR WITHOUT FEVER  NAUSEA AND VOMITING THAT IS NOT CONTROLLED WITH YOUR NAUSEA MEDICATION  *UNUSUAL SHORTNESS OF BREATH  *UNUSUAL BRUISING OR BLEEDING  TENDERNESS IN MOUTH AND THROAT WITH OR WITHOUT PRESENCE OF ULCERS  *URINARY PROBLEMS  *BOWEL PROBLEMS  UNUSUAL RASH Items with * indicate a potential emergency and should be followed up as soon as possible.  Feel free to call the clinic you have any questions or concerns. The clinic phone number is [phone].  Please show the [place] at check-in to the Emergency Department and triage nurse.  Magnesium Sulfate injection What is this medicine? MAGNESIUM SULFATE (mag NEE zee um SUL fate) is an electrolyte injection commonly used to treat low magnesium levels in your blood and to prevent or control certain seizures. What should I tell my health care provider before I take this medicine? They need to know if you have any of these conditions: -heart disease -history of irregular heart beat -kidney disease -an unusual or allergic reaction to magnesium sulfate, medicines, foods, dyes, or preservatives -pregnant or trying to get pregnant -breast-feeding How should I use this medicine? This medicine is for infusion into a vein. It is given by a health care professional in a hospital or clinic setting. Talk to your pediatrician regarding the use of this medicine in children. While this drug may be prescribed for selected conditions, precautions do  apply. What if I miss a dose? This does not apply. What may interact with this medicine? This medicine may interact with the following medications: -certain medicines for anxiety or sleep -certain medicines for seizures like phenobarbital -digoxin -medicines that relax muscles for surgery -narcotic medicines for pain What should I watch for while using this medicine? Your condition will be monitored carefully while you are receiving this medicine. You may need blood work done while you are receiving this medicine. What side effects may I notice from receiving this medicine? Side effects that you should report to your doctor or health care professional as soon as possible: -allergic reactions like skin rash, itching or hives, swelling of the face, lips, or tongue -facial flushing -muscle weakness -signs and symptoms of low blood pressure like dizziness; feeling faint or lightheaded, falls; unusually weak or tired -signs and symptoms of a dangerous change in heartbeat or heart rhythm like chest pain; dizziness; fast or irregular heartbeat; palpitations; breathing problems -sweating Where should I keep my medicine? This drug is given in a hospital or clinic and will not be stored at home.  2017 Elsevier/Gold Standard (2015-04-28 08:24:50)

## 2016-05-09 ENCOUNTER — Encounter: Payer: Self-pay | Admitting: Family Medicine

## 2016-05-17 ENCOUNTER — Other Ambulatory Visit: Payer: Self-pay | Admitting: Emergency Medicine

## 2016-05-17 DIAGNOSIS — Z170 Estrogen receptor positive status [ER+]: Principal | ICD-10-CM

## 2016-05-17 DIAGNOSIS — C50511 Malignant neoplasm of lower-outer quadrant of right female breast: Secondary | ICD-10-CM

## 2016-05-18 ENCOUNTER — Other Ambulatory Visit (HOSPITAL_BASED_OUTPATIENT_CLINIC_OR_DEPARTMENT_OTHER): Payer: Medicare Other

## 2016-05-18 ENCOUNTER — Ambulatory Visit (HOSPITAL_BASED_OUTPATIENT_CLINIC_OR_DEPARTMENT_OTHER): Payer: Medicare Other

## 2016-05-18 ENCOUNTER — Encounter: Payer: Self-pay | Admitting: *Deleted

## 2016-05-18 ENCOUNTER — Encounter: Payer: Self-pay | Admitting: Hematology and Oncology

## 2016-05-18 ENCOUNTER — Ambulatory Visit (HOSPITAL_BASED_OUTPATIENT_CLINIC_OR_DEPARTMENT_OTHER): Payer: Medicare Other | Admitting: Hematology and Oncology

## 2016-05-18 DIAGNOSIS — G620 Drug-induced polyneuropathy: Secondary | ICD-10-CM | POA: Diagnosis not present

## 2016-05-18 DIAGNOSIS — Z79810 Long term (current) use of selective estrogen receptor modulators (SERMs): Secondary | ICD-10-CM | POA: Diagnosis not present

## 2016-05-18 DIAGNOSIS — C50511 Malignant neoplasm of lower-outer quadrant of right female breast: Secondary | ICD-10-CM

## 2016-05-18 DIAGNOSIS — Z5112 Encounter for antineoplastic immunotherapy: Secondary | ICD-10-CM | POA: Diagnosis not present

## 2016-05-18 DIAGNOSIS — Z170 Estrogen receptor positive status [ER+]: Secondary | ICD-10-CM | POA: Diagnosis not present

## 2016-05-18 LAB — CBC WITH DIFFERENTIAL/PLATELET
BASO%: 0.3 % (ref 0.0–2.0)
BASOS ABS: 0 10*3/uL (ref 0.0–0.1)
EOS ABS: 0.3 10*3/uL (ref 0.0–0.5)
EOS%: 4.8 % (ref 0.0–7.0)
HCT: 36.2 % (ref 34.8–46.6)
HEMOGLOBIN: 11.5 g/dL — AB (ref 11.6–15.9)
LYMPH%: 43.8 % (ref 14.0–49.7)
MCH: 29.6 pg (ref 25.1–34.0)
MCHC: 31.8 g/dL (ref 31.5–36.0)
MCV: 93.1 fL (ref 79.5–101.0)
MONO#: 0.4 10*3/uL (ref 0.1–0.9)
MONO%: 5.7 % (ref 0.0–14.0)
NEUT#: 2.8 10*3/uL (ref 1.5–6.5)
NEUT%: 45.4 % (ref 38.4–76.8)
Platelets: 260 10*3/uL (ref 145–400)
RBC: 3.89 10*6/uL (ref 3.70–5.45)
RDW: 15.9 % — AB (ref 11.2–14.5)
WBC: 6.1 10*3/uL (ref 3.9–10.3)
lymph#: 2.7 10*3/uL (ref 0.9–3.3)

## 2016-05-18 LAB — COMPREHENSIVE METABOLIC PANEL
ALBUMIN: 3.5 g/dL (ref 3.5–5.0)
ALK PHOS: 124 U/L (ref 40–150)
ALT: 58 U/L — ABNORMAL HIGH (ref 0–55)
AST: 36 U/L — ABNORMAL HIGH (ref 5–34)
Anion Gap: 10 mEq/L (ref 3–11)
BUN: 14.2 mg/dL (ref 7.0–26.0)
CALCIUM: 9.8 mg/dL (ref 8.4–10.4)
CO2: 28 mEq/L (ref 22–29)
Chloride: 108 mEq/L (ref 98–109)
Creatinine: 1 mg/dL (ref 0.6–1.1)
EGFR: 63 mL/min/{1.73_m2} — AB (ref >90)
GLUCOSE: 144 mg/dL — AB (ref 70–140)
POTASSIUM: 4.6 meq/L (ref 3.5–5.1)
SODIUM: 146 meq/L — AB (ref 136–145)
Total Bilirubin: 0.31 mg/dL (ref 0.20–1.20)
Total Protein: 6.6 g/dL (ref 6.4–8.3)

## 2016-05-18 MED ORDER — ACETAMINOPHEN 325 MG PO TABS
ORAL_TABLET | ORAL | Status: AC
  Filled 2016-05-18: qty 2

## 2016-05-18 MED ORDER — DIPHENHYDRAMINE HCL 25 MG PO CAPS
ORAL_CAPSULE | ORAL | Status: AC
  Filled 2016-05-18: qty 2

## 2016-05-18 MED ORDER — SODIUM CHLORIDE 0.9 % IV SOLN
Freq: Once | INTRAVENOUS | Status: AC
  Administered 2016-05-18: 10:00:00 via INTRAVENOUS

## 2016-05-18 MED ORDER — HEPARIN SOD (PORK) LOCK FLUSH 100 UNIT/ML IV SOLN
500.0000 [IU] | Freq: Once | INTRAVENOUS | Status: AC | PRN
  Administered 2016-05-18: 500 [IU]
  Filled 2016-05-18: qty 5

## 2016-05-18 MED ORDER — DIPHENHYDRAMINE HCL 25 MG PO CAPS
50.0000 mg | ORAL_CAPSULE | Freq: Once | ORAL | Status: AC
  Administered 2016-05-18: 50 mg via ORAL

## 2016-05-18 MED ORDER — CALCIUM GLUCONATE 10 % IV SOLN: 1.0000 g | Freq: Once | INTRAVENOUS | Status: DC

## 2016-05-18 MED ORDER — SODIUM CHLORIDE 0.9 % IV SOLN
Freq: Once | INTRAVENOUS | Status: AC
  Administered 2016-05-18: 11:00:00 via INTRAVENOUS
  Filled 2016-05-18: qty 100

## 2016-05-18 MED ORDER — SODIUM CHLORIDE 0.9 % IV SOLN: 2.0000 g | Freq: Once | INTRAVENOUS | Status: DC

## 2016-05-18 MED ORDER — SODIUM CHLORIDE 0.9 % IV SOLN
6.0000 mg/kg | Freq: Once | INTRAVENOUS | Status: AC
  Administered 2016-05-18: 483 mg via INTRAVENOUS
  Filled 2016-05-18: qty 23

## 2016-05-18 MED ORDER — SODIUM CHLORIDE 0.9% FLUSH
10.0000 mL | INTRAVENOUS | Status: DC | PRN
  Administered 2016-05-18: 10 mL
  Filled 2016-05-18: qty 10

## 2016-05-18 MED ORDER — ACETAMINOPHEN 325 MG PO TABS
650.0000 mg | ORAL_TABLET | Freq: Once | ORAL | Status: AC
  Administered 2016-05-18: 650 mg via ORAL

## 2016-05-18 NOTE — Patient Instructions (Signed)
[facility] Discharge Instructions for Patients Receiving Chemotherapy  Today you received the following chemotherapy agents: Herceptin   To help prevent nausea and vomiting after your treatment, we encourage you to take your nausea medication as directed.    If you develop nausea and vomiting that is not controlled by your nausea medication, call the clinic.   BELOW ARE SYMPTOMS THAT SHOULD BE REPORTED IMMEDIATELY:  *FEVER GREATER THAN 100.5 F  *CHILLS WITH OR WITHOUT FEVER  NAUSEA AND VOMITING THAT IS NOT CONTROLLED WITH YOUR NAUSEA MEDICATION  *UNUSUAL SHORTNESS OF BREATH  *UNUSUAL BRUISING OR BLEEDING  TENDERNESS IN MOUTH AND THROAT WITH OR WITHOUT PRESENCE OF ULCERS  *URINARY PROBLEMS  *BOWEL PROBLEMS  UNUSUAL RASH Items with * indicate a potential emergency and should be followed up as soon as possible.  Feel free to call the clinic you have any questions or concerns. The clinic phone number is [phone].  Please show the CHEMO ALERT CARD at check-in to the Emergency Department and triage nurse.   

## 2016-05-18 NOTE — Assessment & Plan Note (Signed)
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 Adjuvant chemotherapy with [place] 2. Followed by adjuvant antiestrogen therapy with anastrozole 5 years No role of radiation since she had mastectomy. ----------------------------------------------------------------------------------------------------------------------- Current treatment: Completed 5 cycles ofTCH, chemotherapy discontinued for neuropathy. Patient is onHerceptin maintenance Echocardiogram was reviewed 08/25/2015: EF 65-70%  Acute Diverticulitis: ED visit 02/22/16 Shingles: Significant improvement in trying out  Plan: 1. Continue herceptin q 3 weeks 2. Anastrozole 1 mg daily started in 03/15/16 discontinued 04/25/2016 because of pain in hands and feet which patient attributes it to anastrozole  I switched her to tamoxifen therapy.  Tamoxifen toxicities:  Chemo induced peripheral Neuropathy: Quite severe.  Profound disability. Plan on giving calcium-Mag in IV with her Herceptin On gabapentin Physical therapy referral TENS unit  Return to clinic every 6 weeks for follow-up and every 3 weeks for Herceptin.

## 2016-05-18 NOTE — Progress Notes (Signed)
Patient Care Team: Midge Minium, MD as PCP - General [name], MD as Consulting Physician (Cardiology) Marylynn Pearson, MD as Consulting Physician (Ophthalmology) Nicholas Lose, MD as Consulting Physician (Hematology and Oncology) Kyung Rudd, MD as Consulting Physician (Radiation Oncology) Fanny Skates, MD as Consulting Physician (General Surgery)  DIAGNOSIS:  Encounter Diagnosis  Name Primary?  . 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 Adjuvant chemotherapy had to be stopped early because of neuropathy. She continued to have neuropathy symptoms. She is currently on gabapentin. We also give her calcium magnesium infusion through the IV last time. We also switched her treatment from anastrozole to Tamoxifen. She does state that neuropathy is slightly better  today. Last week her sister passed away and she is still mourning. Because of this she feels that the fibromyalgia and arthritis are acting up. She does not have any motivation to do any activity. She most of the time sleeps at home. She is also concerned about weight gain.  REVIEW OF SYSTEMS:   Constitutional: Denies fevers, chills or abnormal weight loss Eyes: Denies blurriness of vision Ears, nose, mouth, throat, and face: Denies mucositis or sore throat Respiratory: Denies cough, dyspnea or wheezes Cardiovascular: Denies palpitation, chest discomfort Gastrointestinal:  Denies nausea, heartburn or change in bowel habits Skin: Denies abnormal skin rashes Lymphatics: Denies new lymphadenopathy or easy bruising Neurological: Severe peripheral neuropathy Behavioral/Psych: Fatigue issues lack of interest  Extremities: No lower extremity edema Breast:  denies any pain or lumps or nodules in either breasts All other systems were reviewed with the patient and are negative.  I have reviewed the past medical history, past surgical history, social history and family history with the patient and they are unchanged from previous note.  ALLERGIES:  is allergic to contrast media [iodinated diagnostic agents]; dilaudid [hydromorphone hcl]; adhesive [tape]; codeine; and lactose intolerance (gi).  MEDICATIONS:  Current Outpatient Prescriptions  Medication Sig Dispense Refill  . albuterol (PROAIR HFA) 108 (90 BASE) MCG/ACT inhaler Inhale 2 puffs into the lungs every 6 (six) hours as needed for wheezing. 1 Inhaler 3  . ALPRAZolam (XANAX) 1 MG tablet TAKE 1 TABLET BY MOUTH TWICE A DAY AS NEEDED FOR ANXIETY 60 tablet 1  . aspirin 81 MG tablet Take 81 mg by mouth daily.    Marland Kitchen atorvastatin (LIPITOR) 20 MG tablet TAKE 1 TABLET BY  MOUTH EVERY DAY (Patient taking differently: TAKE 1 TABLET BY MOUTH EVERY EVENING) 30 tablet 6  . buPROPion (WELLBUTRIN XL) 300 MG 24 hr tablet TAKE 1 TABLET BY MOUTH EVERY DAY 30  tablet 3  . levothyroxine (SYNTHROID, LEVOTHROID) 75 MCG tablet Take 1 tablet (75 mcg total) by mouth daily. 30 tablet 6  . lidocaine-prilocaine (EMLA) cream Apply to affected area once 30 g 3  . magic mouthwash w/lidocaine SOLN 44m Swish,Swallow, or spit 4 times a day as needed 240 mL 1  . meloxicam (MOBIC) 15 MG tablet Take 15 mg by mouth daily.     . mirabegron ER (MYRBETRIQ) 25 MG TB24 tablet Take 1 tablet (25 mg total) by mouth daily. 30 tablet 6  . nitroGLYCERIN (NITROSTAT) 0.4 MG SL tablet Place 1 tablet (0.4 mg total) under the tongue every 5 (five) minutes as needed. As needed for chest pain 30 tablet 3  . ondansetron (ZOFRAN ODT) 4 MG disintegrating tablet Take 1 tablet (4 mg total) by mouth every 8 (eight) hours as needed for nausea or vomiting. 20 tablet 0  . Polyethyl Glycol-Propyl Glycol (SYSTANE ULTRA) 0.4-0.3 % SOLN Place 1 drop into both eyes 3 (three) times daily as needed (for dry eyes).     . prochlorperazine (COMPAZINE) 10 MG tablet Take 1 tablet (10 mg total) by mouth every 6 (six) hours as needed (Nausea or vomiting). 90 tablet 0  . ranitidine (ZANTAC) 150 MG tablet Take 150 mg by mouth 2 (two) times daily as needed for heartburn. Reported on 10/05/2015    . tamoxifen (NOLVADEX) 20 MG tablet Take 1 tablet (20 mg total) by mouth daily. 90 tablet 3  . traMADol (ULTRAM) 50 MG tablet Take 50 mg by mouth daily as needed for moderate pain. Reported on 10/05/2015  0  . valACYclovir (VALTREX) 1000 MG tablet Take 1 tablet (1,000 mg total) by mouth 3 (three) times daily. 30 tablet 0   No current facility-administered medications for this visit.    Facility-Administered Medications Ordered in Other Visits  Medication Dose Route Frequency Provider Last Rate Last Dose  . sodium chloride flush (NS) 0.9 % injection 10 mL  10 mL Intracatheter PRN VNicholas Lose MD   10 mL at 12/15/15 1439    PHYSICAL EXAMINATION: ECOG PERFORMANCE STATUS: 1 - Symptomatic but completely ambulatory  Vitals:    05/18/16 0913  BP: (!) 151/82  Pulse: 80  Resp: 18  Temp: 97.7 F (36.5 C)   Filed Weights   05/18/16 0913  Weight: 182 lb 14.4 oz (83 kg)    GENERAL:alert, no distress and comfortable SKIN: skin color, texture, turgor are normal, no rashes or significant lesions EYES: normal, Conjunctiva are pink and non-injected, sclera clear OROPHARYNX:no exudate, no erythema and lips, buccal mucosa, and tongue normal  NECK: supple, thyroid normal size, non-tender, without nodularity LYMPH:  no palpable lymphadenopathy in the cervical, axillary or inguinal LUNGS: clear to auscultation and percussion with normal breathing effort HEART: regular rate & rhythm and no murmurs and no lower extremity edema ABDOMEN:abdomen soft, non-tender and normal bowel sounds MUSCULOSKELETAL:no cyanosis of digits and no clubbing  NEURO: alert & oriented x 3 with fluent speech, Grade 3 peripheral neuropathy EXTREMITIES: No lower extremity edema  LABORATORY DATA:  I have reviewed the data as listed   Chemistry      Component Value Date/Time   NA 144 04/06/2016 0934   K 4.6 04/06/2016 0934   CL 103 02/21/2016 1927   CO2 27 04/06/2016 0934  BUN 16.9 04/06/2016 0934   CREATININE 1.1 04/06/2016 0934      Component Value Date/Time   CALCIUM 9.9 04/06/2016 0934   ALKPHOS 120 04/06/2016 0934   AST 16 04/06/2016 0934   ALT 16 04/06/2016 0934   BILITOT 0.48 04/06/2016 0934       Lab Results  Component Value Date   WBC 5.2 04/06/2016   HGB 12.8 04/06/2016   HCT 38.9 04/06/2016   MCV 93.5 04/06/2016   PLT 272 04/06/2016   NEUTROABS 2.2 04/06/2016    ASSESSMENT & PLAN:  Breast cancer of lower-outer quadrant of right female breast ([place]) Right mastectomy 08/15/2015: IDC grade 2, 2.2 cm, with associated DCIS intermediate grade, separate focus high-grade DCIS, 0/4 lymph nodes negative, T2 N0 stage II a, ER 90%, PR 5%, HER-2 negative duration 1.42, Ki-67 60%  Treatment plan: 1. Adjuvant chemotherapy  with [place] 2. Followed by adjuvant antiestrogen therapy with anastrozole 5 years No role of radiation since she had mastectomy. ----------------------------------------------------------------------------------------------------------------------- Current treatment: Completed 5 cycles ofTCH, chemotherapy discontinued for neuropathy. Patient is onHerceptin maintenance Echocardiogram was reviewed 08/25/2015: EF 65-70%  Acute Diverticulitis: ED visit 02/22/16 Shingles: Significant improvement in trying out  Plan: 1. Continue herceptin q 3 weeks 2. Anastrozole 1 mg daily started in 03/15/16 discontinued 04/25/2016 because of pain in hands and feet which patient attributes it to anastrozole  I switched her to tamoxifen therapy.  Tamoxifen toxicities: It appears that the musculoskeletal pains have improved. She attributes neuropathy improvement to stopping anastrozole.  Chemo induced peripheral Neuropathy: Quite severe.  Profound disability. Plan on giving calcium-Mag in IV with her Herceptin On gabapentin  Return to clinic every 6 weeks for follow-up and every 3 weeks for Herceptin.   I spent 25 minutes talking to the patient of which more than half was spent in counseling and coordination of care.  No orders of the defined types were placed in this encounter.  The patient has a good understanding of the overall plan. she agrees with it. she will call with any problems that may develop before the next visit here.   Rulon Eisenmenger, MD 05/18/16

## 2016-05-23 ENCOUNTER — Other Ambulatory Visit: Payer: Self-pay

## 2016-05-23 DIAGNOSIS — Z170 Estrogen receptor positive status [ER+]: Principal | ICD-10-CM

## 2016-05-23 DIAGNOSIS — C50511 Malignant neoplasm of lower-outer quadrant of right female breast: Secondary | ICD-10-CM

## 2016-05-23 MED ORDER — GABAPENTIN 300 MG PO CAPS: 900.0000 mg | ORAL_CAPSULE | Freq: Three times a day (TID) | ORAL | 0 refills | Status: DC

## 2016-05-23 NOTE — Progress Notes (Signed)
Pt calling to let us know that pt is out of her neurontin. Verified that pt is taking 900mg  TID. Clarified dose with Dr.Gudena and okay to send more refill and continue with same dose as directed. Called to notify pt that medication is filled at her preferred pharmacy.

## 2016-05-25 DIAGNOSIS — C50111 Malignant neoplasm of central portion of right female breast: Secondary | ICD-10-CM | POA: Diagnosis not present

## 2016-05-25 DIAGNOSIS — Z90710 Acquired absence of both cervix and uterus: Secondary | ICD-10-CM | POA: Diagnosis not present

## 2016-05-25 DIAGNOSIS — Z9889 Other specified postprocedural states: Secondary | ICD-10-CM | POA: Diagnosis not present

## 2016-05-25 DIAGNOSIS — I10 Essential (primary) hypertension: Secondary | ICD-10-CM | POA: Diagnosis not present

## 2016-05-25 DIAGNOSIS — E785 Hyperlipidemia, unspecified: Secondary | ICD-10-CM | POA: Diagnosis not present

## 2016-05-25 DIAGNOSIS — M797 Fibromyalgia: Secondary | ICD-10-CM | POA: Diagnosis not present

## 2016-05-25 DIAGNOSIS — K219 Gastro-esophageal reflux disease without esophagitis: Secondary | ICD-10-CM | POA: Diagnosis not present

## 2016-05-25 DIAGNOSIS — M940 Chondrocostal junction syndrome [Tietze]: Secondary | ICD-10-CM | POA: Diagnosis not present

## 2016-05-25 DIAGNOSIS — Z803 Family history of malignant neoplasm of breast: Secondary | ICD-10-CM | POA: Diagnosis not present

## 2016-05-31 ENCOUNTER — Other Ambulatory Visit: Payer: Self-pay | Admitting: Family Medicine

## 2016-05-31 ENCOUNTER — Telehealth: Payer: Self-pay | Admitting: *Deleted

## 2016-05-31 ENCOUNTER — Telehealth: Payer: Self-pay

## 2016-05-31 DIAGNOSIS — M546 Pain in thoracic spine: Secondary | ICD-10-CM | POA: Diagnosis not present

## 2016-05-31 DIAGNOSIS — M542 Cervicalgia: Secondary | ICD-10-CM | POA: Diagnosis not present

## 2016-05-31 DIAGNOSIS — M47816 Spondylosis without myelopathy or radiculopathy, lumbar region: Secondary | ICD-10-CM | POA: Diagnosis not present

## 2016-05-31 DIAGNOSIS — S134XXD Sprain of ligaments of cervical spine, subsequent encounter: Secondary | ICD-10-CM | POA: Diagnosis not present

## 2016-05-31 NOTE — Telephone Encounter (Signed)
Pt called and left message requesting to see provider due to worsening pain.  Spoke with pt and was informed that pt had seen her orthopedist Dr. Nelva Bush today.  Stated she has severe pain from waist down to both ankles - makes walking very difficulty.  Denied swelling, bowel and bladder function fine.  Stated pain started 1 week ago, but now worsened.  Stated Dr. Nelva Bush suggested pt to see her oncologist since the pain was not orthopedic related. Pt stated "  Feels like pain from inside bone marrow going to outside areas ".   Pt would like to see Dr. Lindi Adie today if possible. Pt's   Phone    [phone].

## 2016-05-31 NOTE — Telephone Encounter (Signed)
Called pt and Dr.Gudena aware.

## 2016-05-31 NOTE — Progress Notes (Signed)
Discussed pt symptoms and pain with Dr.Gudena and agrees to stop tamoxifen for a few weeks and see if her pain goes away. As for severe neuropathy, neurontin is not helping her much. Dr.Gudena states that she may benefit from taking pain medication. Told Dr. Lindi Adie that per pt statement, pt was given a percocet prescription by her ortho dr and will get that filled today. Will follow up with pt in a week to see how she is doing.

## 2016-05-31 NOTE — Telephone Encounter (Signed)
Called pt to discuss pt bone pain symptoms. Pt states " I hurt from the bone out, starting from my hips down to my feet. I just got out of Dr.Ramos ortho office to possibly get some injections for my arthritis, but he said that he thinks its medication related and that I need to call Dr.Gudena about my symptoms." Pt states that she has been having a lot of pain lately and is unable to walk. She is currently taking 900mg  of neurontin TID but with no success or relief of her severe neuropathy. Pt was given a percocet prescription from [facility] office to see if this would help her pain now. Pt was recently switched to tamoxifen and pt started experiencing worsening of symptoms after starting this medication. Pt is very frustrated and would like to know what to do. Told pt to stop tamoxifen for 2 weeks to see if her pain improves, also will discuss with Dr.Gudena about ineffectiveness of Neurontin. Will call pt back to let her know of any updates. Pt verbalized understanding and will await for call.

## 2016-06-01 DIAGNOSIS — M150 Primary generalized (osteo)arthritis: Secondary | ICD-10-CM | POA: Diagnosis not present

## 2016-06-01 DIAGNOSIS — M797 Fibromyalgia: Secondary | ICD-10-CM | POA: Diagnosis not present

## 2016-06-01 DIAGNOSIS — M5136 Other intervertebral disc degeneration, lumbar region: Secondary | ICD-10-CM | POA: Diagnosis not present

## 2016-06-08 ENCOUNTER — Other Ambulatory Visit: Payer: Self-pay

## 2016-06-08 ENCOUNTER — Ambulatory Visit (HOSPITAL_BASED_OUTPATIENT_CLINIC_OR_DEPARTMENT_OTHER): Payer: Medicare Other

## 2016-06-08 VITALS — BP 139/89 | HR 74 | Temp 97.4°F | Resp 16

## 2016-06-08 DIAGNOSIS — C50511 Malignant neoplasm of lower-outer quadrant of right female breast: Secondary | ICD-10-CM

## 2016-06-08 DIAGNOSIS — Z5112 Encounter for antineoplastic immunotherapy: Secondary | ICD-10-CM

## 2016-06-08 DIAGNOSIS — I10 Essential (primary) hypertension: Secondary | ICD-10-CM

## 2016-06-08 DIAGNOSIS — Z170 Estrogen receptor positive status [ER+]: Principal | ICD-10-CM

## 2016-06-08 MED ORDER — SODIUM CHLORIDE 0.9 % IV SOLN: 1.0000 g | Freq: Once | INTRAVENOUS | Status: DC

## 2016-06-08 MED ORDER — SODIUM CHLORIDE 0.9 % IV SOLN: 2.0000 g | Freq: Once | INTRAVENOUS | Status: DC

## 2016-06-08 MED ORDER — DIPHENHYDRAMINE HCL 25 MG PO CAPS
ORAL_CAPSULE | ORAL | Status: AC
  Filled 2016-06-08: qty 2

## 2016-06-08 MED ORDER — SODIUM CHLORIDE 0.9% FLUSH
10.0000 mL | INTRAVENOUS | Status: DC | PRN
  Administered 2016-06-08: 10 mL
  Filled 2016-06-08: qty 10

## 2016-06-08 MED ORDER — SODIUM CHLORIDE 0.9 % IV SOLN
Freq: Once | INTRAVENOUS | Status: AC
  Administered 2016-06-08: 11:00:00 via INTRAVENOUS

## 2016-06-08 MED ORDER — ACETAMINOPHEN 325 MG PO TABS
650.0000 mg | ORAL_TABLET | Freq: Once | ORAL | Status: AC
  Administered 2016-06-08: 650 mg via ORAL

## 2016-06-08 MED ORDER — SODIUM CHLORIDE 0.9 % IV SOLN
Freq: Once | INTRAVENOUS | Status: DC
  Filled 2016-06-08: qty 250

## 2016-06-08 MED ORDER — SODIUM CHLORIDE 0.9 % IV SOLN
Freq: Once | INTRAVENOUS | Status: AC
  Administered 2016-06-08: 11:00:00 via INTRAVENOUS
  Filled 2016-06-08: qty 250

## 2016-06-08 MED ORDER — ACETAMINOPHEN 325 MG PO TABS
ORAL_TABLET | ORAL | Status: AC
  Filled 2016-06-08: qty 2

## 2016-06-08 MED ORDER — HEPARIN SOD (PORK) LOCK FLUSH 100 UNIT/ML IV SOLN
500.0000 [IU] | Freq: Once | INTRAVENOUS | Status: AC | PRN
  Administered 2016-06-08: 500 [IU]
  Filled 2016-06-08: qty 5

## 2016-06-08 MED ORDER — SODIUM CHLORIDE 0.9 % IV SOLN
6.0000 mg/kg | Freq: Once | INTRAVENOUS | Status: AC
  Administered 2016-06-08: 483 mg via INTRAVENOUS
  Filled 2016-06-08: qty 23

## 2016-06-08 MED ORDER — DIPHENHYDRAMINE HCL 25 MG PO CAPS
50.0000 mg | ORAL_CAPSULE | Freq: Once | ORAL | Status: AC
  Administered 2016-06-08: 50 mg via ORAL

## 2016-06-08 NOTE — Patient Instructions (Signed)
[facility] Discharge Instructions for Patients Receiving Chemotherapy  Today you received the following chemotherapy agents: Herceptin   To help prevent nausea and vomiting after your treatment, we encourage you to take your nausea medication as directed.    If you develop nausea and vomiting that is not controlled by your nausea medication, call the clinic.   BELOW ARE SYMPTOMS THAT SHOULD BE REPORTED IMMEDIATELY:  *FEVER GREATER THAN 100.5 F  *CHILLS WITH OR WITHOUT FEVER  NAUSEA AND VOMITING THAT IS NOT CONTROLLED WITH YOUR NAUSEA MEDICATION  *UNUSUAL SHORTNESS OF BREATH  *UNUSUAL BRUISING OR BLEEDING  TENDERNESS IN MOUTH AND THROAT WITH OR WITHOUT PRESENCE OF ULCERS  *URINARY PROBLEMS  *BOWEL PROBLEMS  UNUSUAL RASH Items with * indicate a potential emergency and should be followed up as soon as possible.  Feel free to call the clinic you have any questions or concerns. The clinic phone number is [phone].  Please show the CHEMO ALERT CARD at check-in to the Emergency Department and triage nurse.   

## 2016-06-08 NOTE — Progress Notes (Signed)
Pt is due for next echo for herceptin treatments. Placed order and sent in basket message to Kevan Rosebush with Dr.Bensimhon's office.

## 2016-06-11 DIAGNOSIS — I10 Essential (primary) hypertension: Secondary | ICD-10-CM | POA: Diagnosis not present

## 2016-06-11 DIAGNOSIS — E785 Hyperlipidemia, unspecified: Secondary | ICD-10-CM | POA: Diagnosis not present

## 2016-06-11 DIAGNOSIS — M797 Fibromyalgia: Secondary | ICD-10-CM | POA: Diagnosis not present

## 2016-06-11 DIAGNOSIS — M1990 Unspecified osteoarthritis, unspecified site: Secondary | ICD-10-CM | POA: Diagnosis not present

## 2016-06-11 DIAGNOSIS — I2510 Atherosclerotic heart disease of native coronary artery without angina pectoris: Secondary | ICD-10-CM | POA: Diagnosis not present

## 2016-06-11 DIAGNOSIS — E039 Hypothyroidism, unspecified: Secondary | ICD-10-CM | POA: Diagnosis not present

## 2016-06-11 DIAGNOSIS — E669 Obesity, unspecified: Secondary | ICD-10-CM | POA: Diagnosis not present

## 2016-06-22 ENCOUNTER — Telehealth: Payer: Self-pay | Admitting: *Deleted

## 2016-06-22 NOTE — Telephone Encounter (Signed)
"  I started coughing yesterday afternoon and do not know who to call.  I had a tickling in my throat that progressed to me almost not being able to talk.  The cough started and I've coughed all night.  I do not have a fever.  No sore throat or congestion." Call transferred to extension [phone].

## 2016-06-25 ENCOUNTER — Ambulatory Visit (INDEPENDENT_AMBULATORY_CARE_PROVIDER_SITE_OTHER): Payer: Medicare Other | Admitting: Family Medicine

## 2016-06-25 ENCOUNTER — Encounter: Payer: Self-pay | Admitting: Family Medicine

## 2016-06-25 ENCOUNTER — Other Ambulatory Visit: Payer: Self-pay | Admitting: Family Medicine

## 2016-06-25 ENCOUNTER — Telehealth: Payer: Self-pay

## 2016-06-25 VITALS — BP 140/81 | HR 63 | Temp 99.4°F | Resp 18 | Ht 65.0 in | Wt 180.1 lb

## 2016-06-25 DIAGNOSIS — R509 Fever, unspecified: Secondary | ICD-10-CM

## 2016-06-25 DIAGNOSIS — J40 Bronchitis, not specified as acute or chronic: Secondary | ICD-10-CM

## 2016-06-25 LAB — POCT INFLUENZA A: Rapid Influenza A Ag: NEGATIVE

## 2016-06-25 MED ORDER — PROMETHAZINE-DM 6.25-15 MG/5ML PO SYRP: 5.0000 mL | ORAL_SOLUTION | Freq: Four times a day (QID) | ORAL | 0 refills | Status: DC | PRN

## 2016-06-25 MED ORDER — AZITHROMYCIN 250 MG PO TABS: ORAL_TABLET | ORAL | 0 refills | Status: DC

## 2016-06-25 NOTE — Telephone Encounter (Signed)
Returned pt call regarding coughing symptoms. Pt states that she is scheduled for an appt this afternoon with her pcp. Pt sounds very congested over the phone. Pt states that she has been coughing yellow sputum since Friday and has had intermittent fevers. Advised pt to increase fluid intake and to also monitor any symptoms of sob/cp. Pt wanted to make sure that she is okay to continue with herceptin treatment on Friday. Told pt to call back after a few days to see how she is feeling and if she is able to tolerate doing her treatment, if found to have an upper respiratory infection. Pt states that she does not want to cancel and would like to push through this week. Pt has 3 infusions left until her 1 yr of herceptin is up. Pt states that she will be calling with updates later this week.

## 2016-06-25 NOTE — Progress Notes (Signed)
   Subjective:    Patient ID: Tracey Evans, female    DOB: 04-Feb-1946, 71 y.o.   MRN: [id number]  HPI URI- sxs started 3 days ago w/ cough.  Cough is productive of 'greenish-yellow stuff' as of yesterday.  Denies sinus pain/pressure.  + nasal congestion.  Currently undergoing chemo.  + body aches- but not above baseline.  Sore throat due to cough.  No ear pain.  No known sick contacts- went to funeral Friday.   Review of Systems For ROS see HPI     Objective:   Physical Exam  Constitutional: She is oriented to person, place, and time. She appears well-developed and well-nourished.  Obviously not feeling well  HENT:  Head: Normocephalic and atraumatic.  TMs normal bilaterally Mild nasal congestion Throat w/out erythema, edema, or exudate  Eyes: Conjunctivae and EOM are normal. Pupils are equal, round, and reactive to light.  Neck: Normal range of motion. Neck supple.  Cardiovascular: Normal rate, regular rhythm, normal heart sounds and intact distal pulses.   No murmur heard. Pulmonary/Chest: Effort normal and breath sounds normal. No respiratory distress. She has no wheezes.  + hacking cough  Lymphadenopathy:    She has no cervical adenopathy.  Neurological: She is alert and oriented to person, place, and time.  Skin: Skin is warm and dry.  Psychiatric: She has a normal mood and affect. Her behavior is normal. Thought content normal.  Vitals reviewed.         Assessment & Plan:  Bronchitis- new.  Given pt's change in sputum yesterday and the fact that she's undergoing chemo I will start abx and cough meds.  Reviewed supportive care and red flags that should prompt return.  Pt expressed understanding and is in agreement w/ plan.

## 2016-06-25 NOTE — Telephone Encounter (Signed)
Last OV 04/04/16 Alprazolam last filled 04/19/16 #60 with 1

## 2016-06-25 NOTE — Telephone Encounter (Signed)
Medication filled to pharmacy as requested.   

## 2016-06-25 NOTE — Patient Instructions (Signed)
Follow up as needed/scheduled The flu is negative- thank goodness! Start the Zpack as directed Use the cough syrup as needed- may cause drowsiness Drink plenty of fluids REST! Call with any questions or concerns Hang in there!!!

## 2016-06-25 NOTE — Progress Notes (Signed)
Pre visit review using our clinic review tool, if applicable. No additional management support is needed unless otherwise documented below in the visit note. 

## 2016-06-26 ENCOUNTER — Other Ambulatory Visit: Payer: Self-pay | Admitting: Family Medicine

## 2016-06-28 ENCOUNTER — Other Ambulatory Visit: Payer: Self-pay

## 2016-06-28 DIAGNOSIS — C50511 Malignant neoplasm of lower-outer quadrant of right female breast: Secondary | ICD-10-CM

## 2016-06-28 DIAGNOSIS — Z170 Estrogen receptor positive status [ER+]: Principal | ICD-10-CM

## 2016-06-29 ENCOUNTER — Ambulatory Visit (HOSPITAL_BASED_OUTPATIENT_CLINIC_OR_DEPARTMENT_OTHER): Payer: Medicare Other

## 2016-06-29 ENCOUNTER — Other Ambulatory Visit (HOSPITAL_BASED_OUTPATIENT_CLINIC_OR_DEPARTMENT_OTHER): Payer: Medicare Other

## 2016-06-29 VITALS — BP 132/79 | HR 80 | Temp 98.4°F | Resp 18

## 2016-06-29 DIAGNOSIS — Z170 Estrogen receptor positive status [ER+]: Principal | ICD-10-CM

## 2016-06-29 DIAGNOSIS — Z5112 Encounter for antineoplastic immunotherapy: Secondary | ICD-10-CM

## 2016-06-29 DIAGNOSIS — C50511 Malignant neoplasm of lower-outer quadrant of right female breast: Secondary | ICD-10-CM

## 2016-06-29 LAB — COMPREHENSIVE METABOLIC PANEL
ALBUMIN: 3.7 g/dL (ref 3.5–5.0)
ALK PHOS: 101 U/L (ref 40–150)
ALT: 24 U/L (ref 0–55)
ANION GAP: 8 meq/L (ref 3–11)
AST: 20 U/L (ref 5–34)
BILIRUBIN TOTAL: 0.36 mg/dL (ref 0.20–1.20)
BUN: 17.5 mg/dL (ref 7.0–26.0)
CALCIUM: 9.1 mg/dL (ref 8.4–10.4)
CO2: 27 meq/L (ref 22–29)
CREATININE: 1.1 mg/dL (ref 0.6–1.1)
Chloride: 109 mEq/L (ref 98–109)
EGFR: 59 mL/min/{1.73_m2} — AB (ref >90)
Glucose: 101 mg/dl (ref 70–140)
Potassium: 4.2 mEq/L (ref 3.5–5.1)
Sodium: 144 mEq/L (ref 136–145)
TOTAL PROTEIN: 7 g/dL (ref 6.4–8.3)

## 2016-06-29 LAB — CBC WITH DIFFERENTIAL/PLATELET
BASO%: 0.5 % (ref 0.0–2.0)
Basophils Absolute: 0 10*3/uL (ref 0.0–0.1)
EOS ABS: 0.3 10*3/uL (ref 0.0–0.5)
EOS%: 4.8 % (ref 0.0–7.0)
HEMATOCRIT: 39 % (ref 34.8–46.6)
HEMOGLOBIN: 12.8 g/dL (ref 11.6–15.9)
LYMPH#: 3.5 10*3/uL — AB (ref 0.9–3.3)
LYMPH%: 51.5 % — ABNORMAL HIGH (ref 14.0–49.7)
MCH: 30.8 pg (ref 25.1–34.0)
MCHC: 32.9 g/dL (ref 31.5–36.0)
MCV: 93.5 fL (ref 79.5–101.0)
MONO#: 0.4 10*3/uL (ref 0.1–0.9)
MONO%: 6.3 % (ref 0.0–14.0)
NEUT%: 36.9 % — ABNORMAL LOW (ref 38.4–76.8)
NEUTROS ABS: 2.5 10*3/uL (ref 1.5–6.5)
PLATELETS: 236 10*3/uL (ref 145–400)
RBC: 4.17 10*6/uL (ref 3.70–5.45)
RDW: 15.3 % — AB (ref 11.2–14.5)
WBC: 6.7 10*3/uL (ref 3.9–10.3)

## 2016-06-29 MED ORDER — HEPARIN SOD (PORK) LOCK FLUSH 100 UNIT/ML IV SOLN
500.0000 [IU] | Freq: Once | INTRAVENOUS | Status: AC | PRN
  Administered 2016-06-29: 500 [IU]
  Filled 2016-06-29: qty 5

## 2016-06-29 MED ORDER — SODIUM CHLORIDE 0.9 % IV SOLN
Freq: Once | INTRAVENOUS | Status: AC
  Administered 2016-06-29: 11:00:00 via INTRAVENOUS

## 2016-06-29 MED ORDER — SODIUM CHLORIDE 0.9 % IV SOLN
Freq: Once | INTRAVENOUS | Status: AC
  Administered 2016-06-29: 11:00:00 via INTRAVENOUS
  Filled 2016-06-29: qty 250

## 2016-06-29 MED ORDER — DIPHENHYDRAMINE HCL 25 MG PO CAPS
50.0000 mg | ORAL_CAPSULE | Freq: Once | ORAL | Status: AC
  Administered 2016-06-29: 50 mg via ORAL

## 2016-06-29 MED ORDER — SODIUM CHLORIDE 0.9% FLUSH
10.0000 mL | INTRAVENOUS | Status: DC | PRN
  Administered 2016-06-29: 10 mL
  Filled 2016-06-29: qty 10

## 2016-06-29 MED ORDER — DIPHENHYDRAMINE HCL 25 MG PO CAPS
ORAL_CAPSULE | ORAL | Status: AC
  Filled 2016-06-29: qty 2

## 2016-06-29 MED ORDER — SODIUM CHLORIDE 0.9 % IV SOLN
6.0000 mg/kg | Freq: Once | INTRAVENOUS | Status: AC
  Administered 2016-06-29: 483 mg via INTRAVENOUS
  Filled 2016-06-29: qty 23

## 2016-06-29 MED ORDER — ACETAMINOPHEN 325 MG PO TABS
ORAL_TABLET | ORAL | Status: AC
  Filled 2016-06-29: qty 2

## 2016-06-29 MED ORDER — ACETAMINOPHEN 325 MG PO TABS
650.0000 mg | ORAL_TABLET | Freq: Once | ORAL | Status: AC
  Administered 2016-06-29: 650 mg via ORAL

## 2016-06-29 NOTE — Patient Instructions (Signed)
[facility] Discharge Instructions for Patients Receiving Chemotherapy  Today you received the following chemotherapy agents: Herceptin   If you develop nausea and vomiting that is not controlled by your nausea medication, call the clinic.   BELOW ARE SYMPTOMS THAT SHOULD BE REPORTED IMMEDIATELY:  *FEVER GREATER THAN 100.5 F  *CHILLS WITH OR WITHOUT FEVER  NAUSEA AND VOMITING THAT IS NOT CONTROLLED WITH YOUR NAUSEA MEDICATION  *UNUSUAL SHORTNESS OF BREATH  *UNUSUAL BRUISING OR BLEEDING  TENDERNESS IN MOUTH AND THROAT WITH OR WITHOUT PRESENCE OF ULCERS  *URINARY PROBLEMS  *BOWEL PROBLEMS  UNUSUAL RASH Items with * indicate a potential emergency and should be followed up as soon as possible.  Feel free to call the clinic should you have any questions or concerns. The clinic phone number is [phone].  Please show the CHEMO ALERT CARD at check-in to the Emergency Department and triage nurse.   

## 2016-06-30 DIAGNOSIS — M47816 Spondylosis without myelopathy or radiculopathy, lumbar region: Secondary | ICD-10-CM | POA: Diagnosis not present

## 2016-06-30 DIAGNOSIS — S134XXD Sprain of ligaments of cervical spine, subsequent encounter: Secondary | ICD-10-CM | POA: Diagnosis not present

## 2016-06-30 DIAGNOSIS — M542 Cervicalgia: Secondary | ICD-10-CM | POA: Diagnosis not present

## 2016-06-30 DIAGNOSIS — M546 Pain in thoracic spine: Secondary | ICD-10-CM | POA: Diagnosis not present

## 2016-07-03 ENCOUNTER — Telehealth: Payer: Self-pay | Admitting: Emergency Medicine

## 2016-07-03 NOTE — Telephone Encounter (Signed)
Called patient to follow up on how she is doing in regards to her URI. Patient still having a cough; states she is taking the cough medicine and using her inhaler as needed. Patient states she is able to sleep at night. States she is getting around the house without shortness of breath. Advised patient to call for any worsening sx or any other concerns. Instructed to return as scheduled.   Patient also advised that she has been off the Tamoxifen for approximately 1 month due to muscle aches and pain. States those sx have resolved. Advised patient to discuss further treatment with Dr Lindi Adie when she sees him in April. Patient verbalized understanding.

## 2016-07-10 ENCOUNTER — Other Ambulatory Visit: Payer: Self-pay | Admitting: General Surgery

## 2016-07-10 DIAGNOSIS — Z853 Personal history of malignant neoplasm of breast: Secondary | ICD-10-CM

## 2016-07-11 ENCOUNTER — Ambulatory Visit (HOSPITAL_BASED_OUTPATIENT_CLINIC_OR_DEPARTMENT_OTHER)
Admission: RE | Admit: 2016-07-11 | Discharge: 2016-07-11 | Disposition: A | Discharge status: Home / Self Care | Payer: Medicare Other | Source: Ambulatory Visit | Attending: Internal Medicine | Admitting: Internal Medicine

## 2016-07-11 ENCOUNTER — Encounter (HOSPITAL_COMMUNITY): Payer: Self-pay | Admitting: Internal Medicine

## 2016-07-11 ENCOUNTER — Ambulatory Visit (HOSPITAL_COMMUNITY)
Admission: RE | Admit: 2016-07-11 | Discharge: 2016-07-11 | Disposition: A | Discharge status: Home / Self Care | Payer: Medicare Other | Source: Ambulatory Visit | Attending: Internal Medicine | Admitting: Internal Medicine

## 2016-07-11 VITALS — BP 136/80 | HR 69 | Wt 189.2 lb

## 2016-07-11 DIAGNOSIS — E119 Type 2 diabetes mellitus without complications: Secondary | ICD-10-CM | POA: Insufficient documentation

## 2016-07-11 DIAGNOSIS — I340 Nonrheumatic mitral (valve) insufficiency: Secondary | ICD-10-CM | POA: Diagnosis not present

## 2016-07-11 DIAGNOSIS — J45909 Unspecified asthma, uncomplicated: Secondary | ICD-10-CM | POA: Diagnosis not present

## 2016-07-11 DIAGNOSIS — M797 Fibromyalgia: Secondary | ICD-10-CM | POA: Diagnosis not present

## 2016-07-11 DIAGNOSIS — I071 Rheumatic tricuspid insufficiency: Secondary | ICD-10-CM | POA: Insufficient documentation

## 2016-07-11 DIAGNOSIS — C50511 Malignant neoplasm of lower-outer quadrant of right female breast: Secondary | ICD-10-CM

## 2016-07-11 DIAGNOSIS — E669 Obesity, unspecified: Secondary | ICD-10-CM | POA: Insufficient documentation

## 2016-07-11 DIAGNOSIS — Z7982 Long term (current) use of aspirin: Secondary | ICD-10-CM | POA: Diagnosis not present

## 2016-07-11 DIAGNOSIS — F329 Major depressive disorder, single episode, unspecified: Secondary | ICD-10-CM | POA: Insufficient documentation

## 2016-07-11 DIAGNOSIS — I371 Nonrheumatic pulmonary valve insufficiency: Secondary | ICD-10-CM | POA: Insufficient documentation

## 2016-07-11 DIAGNOSIS — F419 Anxiety disorder, unspecified: Secondary | ICD-10-CM | POA: Diagnosis not present

## 2016-07-11 DIAGNOSIS — E785 Hyperlipidemia, unspecified: Secondary | ICD-10-CM | POA: Insufficient documentation

## 2016-07-11 DIAGNOSIS — E039 Hypothyroidism, unspecified: Secondary | ICD-10-CM | POA: Insufficient documentation

## 2016-07-11 DIAGNOSIS — K219 Gastro-esophageal reflux disease without esophagitis: Secondary | ICD-10-CM | POA: Insufficient documentation

## 2016-07-11 DIAGNOSIS — Z170 Estrogen receptor positive status [ER+]: Secondary | ICD-10-CM

## 2016-07-11 DIAGNOSIS — I10 Essential (primary) hypertension: Secondary | ICD-10-CM | POA: Insufficient documentation

## 2016-07-11 NOTE — Progress Notes (Signed)
  Echocardiogram 2D Echocardiogram has been performed.  Jennette Dubin 07/11/2016, 1:41 PM

## 2016-07-11 NOTE — Progress Notes (Signed)
Patient ID: Tracey Evans, female   DOB: Apr 30, 1945, 71 y.o.   MRN: [id number]   CARDIO-ONCOLOGY CLINIC Note  Referring Physician: Lindi Adie Primary Care: Birdie Riddle Cardiologist: Terrence Dupont   HPI: 71 y/o woman with h/o obesity, GERD HTN, DM2, HL and breast cancer.       Breast cancer of lower-outer quadrant of right female breast ([place])   07/06/2015 Initial Diagnosis Right breast biopsy posterior: IDC ER 90%, PR 5%, Ki-67 60%, HER-2 positive ratio 1.42,copy #6.1 T1c N0 stage IA; Right breast biopsy inferior medial: High-grade DCIS with comedonecrosis; ER 100%, PR 90%; 5 mm calcs   08/15/2015 Surgery Right mastectomy: IDC grade 2, 2.2 cm, with associated DCIS intermediate grade, separate focus high-grade DCIS, 0/4 lymph nodes negative, T2 N0 stage II a, ER 90%, PR 5%, HER-2 negative duration 1.42, Ki-67 60%          09/22/2015 -  Chemotherapy    Adjuvant chemotherapy with TCH 6 cycles followed by Herceptin maintenance for 1 year     Continues to get Herceptin every 3 weeks. No SOB, orthopnea or PND. Recently had PNA and treated with z-pack. BP stable. No palpitations.    Echo 08/25/15: 60-65% lateral s' 10.6 cm/s  GLS -20.4% ECHO 8/04/17/2015: EF 65-70% Lat S' 10.9  GLS -17.6  ECHO 8/04/17/2015: EF 55-60% Lat S' 10.6  GLS -20% ECHO 07/11/2016: EF 55-60% Lat S' 10.4  GLS - 22.3 %    Past Medical History:  Diagnosis Date  . Angina at rest [facility])    "occurs whenever it wants to, but worse during agitation"  . Anxiety   . Asthma   . Binge eating disorder   . Breast cancer of lower-outer quadrant of left female breast ([place]) 07/08/2015   "NEVER HAD LEFT BREAST CANCER" (08/15/2015)  . Cancer of right breast ([name]) 08/15/2015  . Chronic bronchitis ([place])   . Chronic lower back pain   . Complication of anesthesia    had bronc spasms during intubation surgery 2009 on foot-need albuterol inhaler or neb tx preop  . Costochondritis   . Depression   . Diverticular disease   . Fibromyalgia    . GERD (gastroesophageal reflux disease)   . Hot flashes   . Hyperlipidemia   . Hypertension   . Hypothyroidism   . Osteoarthritis    "all over"  . Peptic ulcer disease   . Shortness of breath dyspnea   . Type II diabetes mellitus ([place])    "diet controlled" (08/15/2015)    Current Outpatient Prescriptions  Medication Sig Dispense Refill  . ALPRAZolam (XANAX) 1 MG tablet TAKE 1 TABLET BY MOUTH TWICE A DAY AS NEEDED FOR ANXIETY 60 tablet 1  . amLODipine (NORVASC) 2.5 MG tablet Take 2.5 mg by mouth daily.  3  . aspirin 81 MG tablet Take 81 mg by mouth daily.    Marland Kitchen atorvastatin (LIPITOR) 20 MG tablet TAKE 1 TABLET BY MOUTH EVERY DAY (Patient taking differently: TAKE 1 TABLET BY MOUTH EVERY EVENING) 30 tablet 6  . buPROPion (WELLBUTRIN XL) 300 MG 24 hr tablet TAKE 1 TABLET BY MOUTH EVERY DAY 30 tablet 3  . gabapentin (NEURONTIN) 300 MG capsule Take 3 capsules (900 mg total) by mouth 3 (three) times daily. 270 capsule 0  . HYDROcodone-acetaminophen (NORCO/VICODIN) 5-325 MG tablet Take 1 tablet by mouth 3 (three) times daily as needed.  0  . levothyroxine (SYNTHROID, LEVOTHROID) 75 MCG tablet TAKE 1 TABLET (75 MCG TOTAL) BY MOUTH DAILY. 30 tablet 6  . lidocaine-prilocaine (  EMLA) cream Apply to affected area once 30 g 3  . magic mouthwash w/lidocaine SOLN 30m Swish,Swallow, or spit 4 times a day as needed 240 mL 1  . meloxicam (MOBIC) 15 MG tablet Take 15 mg by mouth daily.     . ondansetron (ZOFRAN ODT) 4 MG disintegrating tablet Take 1 tablet (4 mg total) by mouth every 8 (eight) hours as needed for nausea or vomiting. 20 tablet 0  . Polyethyl Glycol-Propyl Glycol (SYSTANE ULTRA) 0.4-0.3 % SOLN Place 1 drop into both eyes 3 (three) times daily as needed (for dry eyes).     .Marland KitchenPROAIR HFA 108 (90 Base) MCG/ACT inhaler INHALE 2 PUFFS INTO THE LUNGS EVERY 6 (SIX) HOURS AS NEEDED FOR WHEEZING. 8.5 Inhaler 0  . prochlorperazine (COMPAZINE) 10 MG tablet Take 1 tablet (10 mg total) by mouth every 6  (six) hours as needed (Nausea or vomiting). 90 tablet 0  . ranitidine (ZANTAC) 150 MG tablet Take 150 mg by mouth 2 (two) times daily as needed for heartburn. Reported on 10/05/2015    . tamoxifen (NOLVADEX) 20 MG tablet Take 1 tablet (20 mg total) by mouth daily. 90 tablet 3  . tiZANidine (ZANAFLEX) 4 MG tablet TAKE 1 TABLET BY MOUTH EVERY 8 HOURS AS NEEDED FOR SPASM  0  . traMADol (ULTRAM) 50 MG tablet Take 50 mg by mouth daily as needed for moderate pain. Reported on 10/05/2015  0  . valACYclovir (VALTREX) 1000 MG tablet Take 1 tablet (1,000 mg total) by mouth 3 (three) times daily. 30 tablet 0  . nitroGLYCERIN (NITROSTAT) 0.4 MG SL tablet Place 1 tablet (0.4 mg total) under the tongue every 5 (five) minutes as needed. As needed for chest pain (Patient not taking: Reported on 07/11/2016) 30 tablet 3  . promethazine-dextromethorphan (PROMETHAZINE-DM) 6.25-15 MG/5ML syrup Take 5 mLs by mouth 4 (four) times daily as needed. (Patient not taking: Reported on 07/11/2016) 240 mL 0   No current facility-administered medications for this encounter.    Facility-Administered Medications Ordered in Other Encounters  Medication Dose Route Frequency Provider Last Rate Last Dose  . sodium chloride flush (NS) 0.9 % injection 10 mL  10 mL Intracatheter PRN VNicholas Lose MD   10 mL at 12/15/15 1439    Allergies  Allergen Reactions  . Contrast Media [Iodinated Diagnostic Agents] Shortness Of Breath    Patient has SOB, tightness in chest and feels like an elephant is sitting on chest, patient should be  scanned at hospital Per Dr. BAlvester Chou . Dilaudid [Hydromorphone Hcl] Anaphylaxis    Pt stopped breathing  . Adhesive [Tape]     blister  . Codeine Nausea Only    insomnia  . Lactose Intolerance (Gi) Diarrhea      Social History   Social History  . Marital status: Single    Spouse name: N/A  . Number of children: N/A  . Years of education: N/A   Occupational History  . Not on file.   Social History Main  Topics  . Smoking status: Never Smoker  . Smokeless tobacco: Never Used  . Alcohol use 0.0 oz/week     Comment: 08/15/2015 "1/2 glass of wine q 2 months"   . Drug use: No  . Sexual activity: Not Currently   Other Topics Concern  . Not on file   Social History Narrative  . No narrative on file      Family History  Problem Relation Age of Onset  . Dementia Mother   .  Stroke Mother   . Heart Problems Mother   . Dementia Father   . Alcohol abuse Father   . Colon cancer Father     dx. 6 or younger  . Breast cancer Sister 71    inflammatory  . Diverticulitis Sister     maternal half-sister; severe - causing partial colectomy  . Breast cancer Sister     maternal half-sister; dx. early 35s  . Breast cancer Other 42    niece  . Breast cancer Other     niece dx. 73s  . Alcohol abuse Brother     Vitals:   07/11/16 1350  BP: 136/80  Pulse: 69  SpO2: 100%  Weight: 189 lb 4 oz (85.8 kg)    PHYSICAL EXAM: General:  Well appearing. No resp difficulty HEENT: normal Neck: supple. no JVD. Carotids 2+ bilat; no bruits. No lymphadenopathy or thryomegaly appreciated. Cor: PMI nondisplaced. Regular rate & rhythm. No rubs, gallops. 2/6 SEM RUSB s2 ok  Lungs: clear Abdomen: soft, nontender, nondistended. No hepatosplenomegaly. No bruits or masses. Good bowel sounds. Extremities: no cyanosis, clubbing, rash, edema Neuro: alert & orientedx3, cranial nerves grossly intact. moves all 4 extremities w/o difficulty. Affect pleasant    ASSESSMENT & PLAN: 1. Breast cancer of lower-outer quadrant of right female breast [facility]) Right mastectomy 08/15/2015: IDC grade 2, 2.2 cm, with associated DCIS intermediate grade, separate focus high-grade DCIS, 0/4 lymph nodes negative, T2 N0 stage II a, ER 90%, PR 5%, HER-2 negative duration 1.42, Ki-67 60%     --Completed TCH fx 6 cycles. Now continues on herceptin for 1 year.  (finishes 6/18)     --Followed by adjuvant antiestrogen therapy with  tamoxifene 5 years Today Dr Haroldine Laws discussed and reviewed ECHO. Stable ECHO parameters.  2. HTN -Stable.   Follow up as needed.    Darrick Grinder, NP-C    Patient seen and examined with Darrick Grinder, NP. We discussed all aspects of the encounter. I agree with the assessment and plan as stated above.   I reviewed echos images personally. EF and Doppler parameters stable. No HF on exam. Continue Herceptin. She will complete in June. No need for further echos.   Glori Bickers, MD  2:19 PM

## 2016-07-11 NOTE — Addendum Note (Signed)
Encounter addended by: Scarlette Calico, RN on: 07/11/2016  2:22 PM<BR>    Actions taken: Sign clinical note

## 2016-07-11 NOTE — Patient Instructions (Signed)
Follow up as needed

## 2016-07-15 ENCOUNTER — Other Ambulatory Visit: Payer: Self-pay | Admitting: Hematology and Oncology

## 2016-07-19 ENCOUNTER — Other Ambulatory Visit: Payer: Self-pay | Admitting: General Surgery

## 2016-07-19 ENCOUNTER — Ambulatory Visit
Admission: RE | Admit: 2016-07-19 | Discharge: 2016-07-19 | Disposition: A | Discharge status: Home / Self Care | Payer: Medicare Other | Source: Ambulatory Visit | Attending: General Surgery | Admitting: General Surgery

## 2016-07-19 DIAGNOSIS — Z853 Personal history of malignant neoplasm of breast: Secondary | ICD-10-CM

## 2016-07-19 DIAGNOSIS — Z1231 Encounter for screening mammogram for malignant neoplasm of breast: Secondary | ICD-10-CM | POA: Diagnosis not present

## 2016-07-19 HISTORY — DX: Personal history of antineoplastic chemotherapy: Z92.21

## 2016-07-20 ENCOUNTER — Encounter: Payer: Self-pay | Admitting: *Deleted

## 2016-07-20 ENCOUNTER — Encounter: Payer: Self-pay | Admitting: Pharmacist

## 2016-07-20 ENCOUNTER — Encounter: Payer: Self-pay | Admitting: Hematology and Oncology

## 2016-07-20 ENCOUNTER — Encounter: Payer: Self-pay | Admitting: General Practice

## 2016-07-20 ENCOUNTER — Other Ambulatory Visit (HOSPITAL_BASED_OUTPATIENT_CLINIC_OR_DEPARTMENT_OTHER): Payer: Medicare Other

## 2016-07-20 ENCOUNTER — Ambulatory Visit (HOSPITAL_BASED_OUTPATIENT_CLINIC_OR_DEPARTMENT_OTHER): Payer: Medicare Other

## 2016-07-20 ENCOUNTER — Ambulatory Visit (HOSPITAL_BASED_OUTPATIENT_CLINIC_OR_DEPARTMENT_OTHER): Payer: Medicare Other | Admitting: Hematology and Oncology

## 2016-07-20 DIAGNOSIS — G620 Drug-induced polyneuropathy: Secondary | ICD-10-CM

## 2016-07-20 DIAGNOSIS — Z5112 Encounter for antineoplastic immunotherapy: Secondary | ICD-10-CM

## 2016-07-20 DIAGNOSIS — C50511 Malignant neoplasm of lower-outer quadrant of right female breast: Secondary | ICD-10-CM

## 2016-07-20 DIAGNOSIS — Z79810 Long term (current) use of selective estrogen receptor modulators (SERMs): Secondary | ICD-10-CM

## 2016-07-20 DIAGNOSIS — Z170 Estrogen receptor positive status [ER+]: Secondary | ICD-10-CM

## 2016-07-20 LAB — CBC WITH DIFFERENTIAL/PLATELET
BASO%: 0.3 % (ref 0.0–2.0)
Basophils Absolute: 0 10*3/uL (ref 0.0–0.1)
EOS%: 5.8 % (ref 0.0–7.0)
Eosinophils Absolute: 0.4 10*3/uL (ref 0.0–0.5)
HCT: 37.3 % (ref 34.8–46.6)
HGB: 12.3 g/dL (ref 11.6–15.9)
LYMPH%: 48.8 % (ref 14.0–49.7)
MCH: 30.8 pg (ref 25.1–34.0)
MCHC: 33 g/dL (ref 31.5–36.0)
MCV: 93.5 fL (ref 79.5–101.0)
MONO#: 0.5 10*3/uL (ref 0.1–0.9)
MONO%: 7.1 % (ref 0.0–14.0)
NEUT%: 38 % — ABNORMAL LOW (ref 38.4–76.8)
NEUTROS ABS: 2.5 10*3/uL (ref 1.5–6.5)
PLATELETS: 254 10*3/uL (ref 145–400)
RBC: 3.99 10*6/uL (ref 3.70–5.45)
RDW: 14.8 % — ABNORMAL HIGH (ref 11.2–14.5)
WBC: 6.5 10*3/uL (ref 3.9–10.3)
lymph#: 3.2 10*3/uL (ref 0.9–3.3)
nRBC: 0 % (ref 0–0)

## 2016-07-20 LAB — COMPREHENSIVE METABOLIC PANEL
ALT: 13 U/L (ref 0–55)
ANION GAP: 8 meq/L (ref 3–11)
AST: 13 U/L (ref 5–34)
Albumin: 3.7 g/dL (ref 3.5–5.0)
Alkaline Phosphatase: 97 U/L (ref 40–150)
BILIRUBIN TOTAL: 0.33 mg/dL (ref 0.20–1.20)
BUN: 15.1 mg/dL (ref 7.0–26.0)
CALCIUM: 9.4 mg/dL (ref 8.4–10.4)
CHLORIDE: 108 meq/L (ref 98–109)
CO2: 28 mEq/L (ref 22–29)
CREATININE: 1 mg/dL (ref 0.6–1.1)
EGFR: 66 mL/min/{1.73_m2} — AB (ref >90)
Glucose: 108 mg/dl (ref 70–140)
Potassium: 4.3 mEq/L (ref 3.5–5.1)
Sodium: 145 mEq/L (ref 136–145)
Total Protein: 6.7 g/dL (ref 6.4–8.3)

## 2016-07-20 MED ORDER — HEPARIN SOD (PORK) LOCK FLUSH 100 UNIT/ML IV SOLN
500.0000 [IU] | Freq: Once | INTRAVENOUS | Status: AC | PRN
  Administered 2016-07-20: 500 [IU]
  Filled 2016-07-20: qty 5

## 2016-07-20 MED ORDER — SODIUM CHLORIDE 0.9 % IV SOLN
Freq: Once | INTRAVENOUS | Status: AC
  Administered 2016-07-20: 11:00:00 via INTRAVENOUS

## 2016-07-20 MED ORDER — SODIUM CHLORIDE 0.9% FLUSH
10.0000 mL | INTRAVENOUS | Status: DC | PRN
  Administered 2016-07-20: 10 mL
  Filled 2016-07-20: qty 10

## 2016-07-20 MED ORDER — DIPHENHYDRAMINE HCL 25 MG PO CAPS
ORAL_CAPSULE | ORAL | Status: AC
  Filled 2016-07-20: qty 2

## 2016-07-20 MED ORDER — DIPHENHYDRAMINE HCL 25 MG PO CAPS
50.0000 mg | ORAL_CAPSULE | Freq: Once | ORAL | Status: AC
  Administered 2016-07-20: 50 mg via ORAL

## 2016-07-20 MED ORDER — FUROSEMIDE 20 MG PO TABS: 20.0000 mg | ORAL_TABLET | Freq: Every day | ORAL | 1 refills | Status: DC

## 2016-07-20 MED ORDER — ACETAMINOPHEN 325 MG PO TABS
ORAL_TABLET | ORAL | Status: AC
  Filled 2016-07-20: qty 2

## 2016-07-20 MED ORDER — SODIUM CHLORIDE 0.9 % IV SOLN
Freq: Once | INTRAVENOUS | Status: AC
  Administered 2016-07-20: 12:00:00 via INTRAVENOUS
  Filled 2016-07-20: qty 250

## 2016-07-20 MED ORDER — ACETAMINOPHEN 325 MG PO TABS
650.0000 mg | ORAL_TABLET | Freq: Once | ORAL | Status: AC
  Administered 2016-07-20: 650 mg via ORAL

## 2016-07-20 MED ORDER — SODIUM CHLORIDE 0.9 % IV SOLN
6.0000 mg/kg | Freq: Once | INTRAVENOUS | Status: AC
  Administered 2016-07-20: 483 mg via INTRAVENOUS
  Filled 2016-07-20: qty 23

## 2016-07-20 NOTE — Progress Notes (Addendum)
Consent documentation  Study code: [id number]  Met with patient following the patients provider visit on 07/20/16 at 1000. Patient was alone for this visit. Provided an overview of the "Pharmacogenetic analysis of toxicities related to administration of taxanes in breast cancer patients" study.  Consent form was reviewed with the patient (reviewed the study purpose, patient's role, possible side effects, cost, risk, information protection) All of the patient's questions were answered and she agreed to participate in the study. A signed copy of the consent form was given to the patient. All eligibility criteria have been met and patient has been enrolled in the study.  Study sample was collected via buccal swab after consent was obtained. Patient was informed that the sample would be sent to the lab for processing after samples were collected from 25 patients and that it would take approximately 2 weeks for the lab to process that sample.   Met with the patient for 15 minutes.  Darl Pikes, PharmD, [place] Clinical Pharmacist- Oncology Pharmacy Resident

## 2016-07-20 NOTE — Patient Instructions (Signed)
[facility] Discharge Instructions for Patients Receiving Chemotherapy  Today you received the following chemotherapy agents: Herceptin   If you develop nausea and vomiting that is not controlled by your nausea medication, call the clinic.   BELOW ARE SYMPTOMS THAT SHOULD BE REPORTED IMMEDIATELY:  *FEVER GREATER THAN 100.5 F  *CHILLS WITH OR WITHOUT FEVER  NAUSEA AND VOMITING THAT IS NOT CONTROLLED WITH YOUR NAUSEA MEDICATION  *UNUSUAL SHORTNESS OF BREATH  *UNUSUAL BRUISING OR BLEEDING  TENDERNESS IN MOUTH AND THROAT WITH OR WITHOUT PRESENCE OF ULCERS  *URINARY PROBLEMS  *BOWEL PROBLEMS  UNUSUAL RASH Items with * indicate a potential emergency and should be followed up as soon as possible.  Feel free to call the clinic should you have any questions or concerns. The clinic phone number is [phone].  Please show the CHEMO ALERT CARD at check-in to the Emergency Department and triage nurse.   

## 2016-07-20 NOTE — Progress Notes (Signed)
Patient Care Team: Midge Minium, MD as PCP - General [name], MD as Consulting Physician (Cardiology) Marylynn Pearson, MD as Consulting Physician (Ophthalmology) Nicholas Lose, MD as Consulting Physician (Hematology and Oncology) Kyung Rudd, MD as Consulting Physician (Radiation Oncology) Fanny Skates, MD as Consulting Physician (General Surgery)  DIAGNOSIS:  Encounter Diagnosis  Name Primary?  . 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, heterozygous      08/15/2015 Surgery    Right mastectomy: IDC grade 2, 2.2 cm, with associated DCIS intermediate grade, separate focus high-grade DCIS, 0/4 lymph nodes negative, T2 N0 stage II a, ER 90%, PR 5%, HER-2 negative duration 1.42, Ki-67 60%      09/22/2015 - 01/05/2016 Chemotherapy    Adjuvant chemotherapy with TCH 5 cycles followed by Herceptin maintenance for 1 year      CHIEF COMPLIANT: Complaining of abdominal distention, also neuropathy  INTERVAL HISTORY: Tracey Evans is a 71 year old with above-mentioned history of right breast cancer treated with mastectomy and adjuvant chemotherapy and is currently on surveillance. She came in today complaining that the right side of the abdomen has gotten markedly swollen and she thinks there is something going on in side effect. There is also some mild vague abdominal discomfort. 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 But she tells me that the neuropathy is  not a concern and that it is abdominal distention that is a problem. She had stopped antiestrogen therapy.  REVIEW OF SYSTEMS:   Constitutional: Denies fevers, chills or abnormal weight loss Eyes: Denies blurriness of vision Ears, nose, mouth, throat, and face: Denies mucositis or sore throat Respiratory: Denies cough, dyspnea or wheezes Cardiovascular: Denies palpitation, chest discomfort Gastrointestinal:  Abdominal distention especially in the right side accompanied by pain Skin: Denies abnormal skin rashes Lymphatics: Denies new lymphadenopathy or easy bruising Neurological: Neuropathy in hands and feet Behavioral/Psych: Patient is anxious and does not wish to go out of the house because of her abdominal distention.  Extremities: No lower extremity edema  All other systems were reviewed with the patient and are negative.  I have reviewed the past medical history, past surgical history, social history and family history with the patient and they are unchanged from previous note.  ALLERGIES:  is allergic to contrast media [iodinated diagnostic agents]; dilaudid [hydromorphone hcl]; adhesive [tape]; codeine; and lactose intolerance (gi).  MEDICATIONS:  Current Outpatient Prescriptions  Medication Sig Dispense Refill  . ALPRAZolam (XANAX) 1 MG tablet TAKE 1 TABLET BY MOUTH TWICE A DAY AS NEEDED FOR ANXIETY 60 tablet 1  . amLODipine (NORVASC) 2.5 MG tablet Take 2.5 mg by mouth daily.  3  . aspirin 81 MG tablet Take 81 mg by mouth daily.    Marland Kitchen atorvastatin (LIPITOR) 20 MG tablet TAKE 1 TABLET BY MOUTH EVERY DAY (Patient taking differently: TAKE 1 TABLET BY MOUTH EVERY EVENING) 30 tablet 6  . buPROPion (WELLBUTRIN XL) 300 MG 24 hr tablet TAKE 1 TABLET BY MOUTH EVERY DAY 30 tablet  3  . gabapentin (NEURONTIN) 300 MG capsule TAKE 3 CAPSULES (900 MG TOTAL) BY MOUTH 3 (THREE) TIMES DAILY. 270 capsule 0  . HYDROcodone-acetaminophen (NORCO/VICODIN) 5-325 MG tablet Take 1 tablet by mouth 3 (three)  times daily as needed.  0  . levothyroxine (SYNTHROID, LEVOTHROID) 75 MCG tablet TAKE 1 TABLET (75 MCG TOTAL) BY MOUTH DAILY. 30 tablet 6  . lidocaine-prilocaine (EMLA) cream Apply to affected area once 30 g 3  . magic mouthwash w/lidocaine SOLN 56m Swish,Swallow, or spit 4 times a day as needed 240 mL 1  . meloxicam (MOBIC) 15 MG tablet Take 15 mg by mouth daily.     . nitroGLYCERIN (NITROSTAT) 0.4 MG SL tablet Place 1 tablet (0.4 mg total) under the tongue every 5 (five) minutes as needed. As needed for chest pain (Patient not taking: Reported on 07/11/2016) 30 tablet 3  . ondansetron (ZOFRAN ODT) 4 MG disintegrating tablet Take 1 tablet (4 mg total) by mouth every 8 (eight) hours as needed for nausea or vomiting. 20 tablet 0  . Polyethyl Glycol-Propyl Glycol (SYSTANE ULTRA) 0.4-0.3 % SOLN Place 1 drop into both eyes 3 (three) times daily as needed (for dry eyes).     .Marland KitchenPROAIR HFA 108 (90 Base) MCG/ACT inhaler INHALE 2 PUFFS INTO THE LUNGS EVERY 6 (SIX) HOURS AS NEEDED FOR WHEEZING. 8.5 Inhaler 0  . prochlorperazine (COMPAZINE) 10 MG tablet Take 1 tablet (10 mg total) by mouth every 6 (six) hours as needed (Nausea or vomiting). 90 tablet 0  . promethazine-dextromethorphan (PROMETHAZINE-DM) 6.25-15 MG/5ML syrup Take 5 mLs by mouth 4 (four) times daily as needed. (Patient not taking: Reported on 07/11/2016) 240 mL 0  . ranitidine (ZANTAC) 150 MG tablet Take 150 mg by mouth 2 (two) times daily as needed for heartburn. Reported on 10/05/2015    . tamoxifen (NOLVADEX) 20 MG tablet Take 1 tablet (20 mg total) by mouth daily. 90 tablet 3  . tiZANidine (ZANAFLEX) 4 MG tablet TAKE 1 TABLET BY MOUTH EVERY 8 HOURS AS NEEDED FOR SPASM  0  . traMADol (ULTRAM) 50 MG tablet Take 50 mg by mouth daily as needed for moderate pain. Reported on 10/05/2015  0  . valACYclovir (VALTREX) 1000 MG tablet Take 1 tablet (1,000 mg total) by mouth 3 (three) times daily. 30 tablet 0   No current facility-administered medications for  this visit.    Facility-Administered Medications Ordered in Other Visits  Medication Dose Route Frequency Provider Last Rate Last Dose  . sodium chloride flush (NS) 0.9 % injection 10 mL  10 mL Intracatheter PRN VNicholas Lose MD   10 mL at 12/15/15 1439    PHYSICAL EXAMINATION: ECOG PERFORMANCE STATUS: 1 - Symptomatic but completely ambulatory  Vitals:   07/20/16 0950  BP: (!) 147/87  Pulse: 78  Resp: 18  Temp: 97.8 F (36.6 C)   Filed Weights   07/20/16 0950  Weight: 188 lb 3.2 oz (85.4 kg)    GENERAL:alert, no distress and comfortable SKIN: skin color, texture, turgor are normal, no rashes or significant lesions EYES: normal, Conjunctiva are pink and non-injected, sclera clear OROPHARYNX:no exudate, no erythema and lips, buccal mucosa, and tongue normal  NECK: supple, thyroid normal size, non-tender, without nodularity LYMPH:  no palpable lymphadenopathy in the cervical, axillary or inguinal LUNGS: clear to auscultation and percussion with normal breathing effort HEART: regular rate & rhythm and no murmurs and no lower extremity edema ABDOMEN: Abdomen is slightly distended but no palpable lumps or nodules. No tenderness.  MUSCULOSKELETAL:no cyanosis of digits and no clubbing  NEURO: alert & oriented x 3 with fluent speech, grade 2 neuropathy. EXTREMITIES: No lower extremity edema  LABORATORY DATA:  I have reviewed the data as listed   Chemistry      Component Value Date/Time   NA 145 07/20/2016 0842   K 4.3 07/20/2016 0842   CL 103 02/21/2016 1927   CO2 28 07/20/2016 0842   BUN 15.1 07/20/2016 0842   CREATININE 1.0 07/20/2016 0842      Component Value Date/Time   CALCIUM 9.4 07/20/2016 0842   ALKPHOS 97 07/20/2016 0842   AST 13 07/20/2016 0842   ALT 13 07/20/2016 0842   BILITOT 0.33 07/20/2016 0842       Lab Results  Component Value Date   WBC 6.5 07/20/2016   HGB 12.3 07/20/2016   HCT 37.3 07/20/2016   MCV 93.5 07/20/2016   PLT 254 07/20/2016    NEUTROABS 2.5 07/20/2016    ASSESSMENT & PLAN:  Breast cancer of lower-outer quadrant of right female breast ([place]) Right mastectomy 08/15/2015: IDC grade 2, 2.2 cm, with associated DCIS intermediate grade, separate focus high-grade DCIS, 0/4 lymph nodes negative, T2 N0 stage II a, ER 90%, PR 5%, HER-2 negative duration 1.42, Ki-67 60%  Treatment plan: 1. Adjuvant chemotherapy with [place] 2. Followed by adjuvant antiestrogen therapy with anastrozole 5 years No role of radiation since she had mastectomy. ----------------------------------------------------------------------------------------------------------------------- Current treatment: Completed 5 cycles ofTCH, chemotherapy discontinued for neuropathy. Patient is onHerceptin maintenance Echocardiogram was reviewed 08/25/2015: EF 65-70%  Acute Diverticulitis: ED visit 02/22/16 Shingles: Significant improvement in trying out  Plan: 1. Continue herceptin q 3 weeks 2. Anastrozole 1 mg daily startedin 03/15/16 discontinued 04/25/2016 because of pain in hands and feet which patient attributes it to anastrozole I switched her to tamoxifen therapy. But she stopped taking tamoxifen as well.  Abdominal distention: I do not feel any palpable lumps or nodules. I suspect that she is getting fatter because she has been eating completely sugary diet. She tells me that she 6-8 cinnamon bunsand is living off crackers and cookies all day. She continues to gain weight because of this. I will obtain a CT of her abdomen to evaluate this further. I will see her back in a week to assess the results.  Chemo induced peripheral Neuropathy: Quite severe.  Profound disability. On gabapentin  Return to clinic every 6 weeks for follow-up and every 3 weeks for Herceptin.   I spent 25 minutes talking to the patient of which more than half was spent in counseling and coordination of care.  No orders of the defined types were placed in this  encounter.  The patient has a good understanding of the overall plan. she agrees with it. she will call with any problems that may develop before the next visit here.   Rulon Eisenmenger, MD 07/20/16

## 2016-07-20 NOTE — Progress Notes (Signed)
[place] Spiritual Care Note  Met with Tracey Evans for nearly 2h in infusion per referral from breast navigator Varney Biles Martini/RN for emotional support.  Tracey Evans used opportunity well to share and process several recent stressors. Helped her reframe her story/perspective to reflect positive changes that she shared. Normalized feelings, providing reflective listening and pastoral reflection.  We plan to f/u by phone the week of 4/23 to process anticipated updates. Please also page if immediate needs arise. Thank you.   [place], [place], [facility] Pager [phone] Voicemail [phone]

## 2016-07-20 NOTE — Assessment & Plan Note (Signed)
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 Adjuvant chemotherapy with [place] 2. Followed by adjuvant antiestrogen therapy with anastrozole 5 years No role of radiation since she had mastectomy. ----------------------------------------------------------------------------------------------------------------------- Current treatment: Completed 5 cycles ofTCH, chemotherapy discontinued for neuropathy. Patient is onHerceptin maintenance Echocardiogram was reviewed 08/25/2015: EF 65-70%  Acute Diverticulitis: ED visit 02/22/16 Shingles: Significant improvement in trying out  Plan: 1. Continue herceptin q 3 weeks 2. Anastrozole 1 mg daily startedin 03/15/16 discontinued 04/25/2016 because of pain in hands and feet which patient attributes it to anastrozole I switched her to tamoxifen therapy.  Tamoxifen toxicities: It appears that the musculoskeletal pains have improved. She attributes neuropathy improvement to stopping anastrozole.  Chemo induced peripheral Neuropathy: Quite severe.  Profound disability. On gabapentin  Return to clinic every 6 weeks for follow-up and every 3 weeks for Herceptin.

## 2016-07-27 ENCOUNTER — Encounter (HOSPITAL_COMMUNITY): Payer: Self-pay

## 2016-07-27 ENCOUNTER — Ambulatory Visit (HOSPITAL_COMMUNITY)
Admission: RE | Admit: 2016-07-27 | Discharge: 2016-07-27 | Disposition: A | Discharge status: Home / Self Care | Payer: Medicare Other | Source: Ambulatory Visit | Attending: Hematology and Oncology | Admitting: Hematology and Oncology

## 2016-07-27 ENCOUNTER — Telehealth: Payer: Self-pay | Admitting: Emergency Medicine

## 2016-07-27 ENCOUNTER — Other Ambulatory Visit: Payer: Self-pay | Admitting: Emergency Medicine

## 2016-07-27 DIAGNOSIS — Z170 Estrogen receptor positive status [ER+]: Principal | ICD-10-CM

## 2016-07-27 DIAGNOSIS — C50511 Malignant neoplasm of lower-outer quadrant of right female breast: Secondary | ICD-10-CM

## 2016-07-27 MED ORDER — DIPHENHYDRAMINE HCL 50 MG PO TABS: ORAL_TABLET | ORAL | 0 refills | Status: DC

## 2016-07-27 MED ORDER — IOPAMIDOL (ISOVUE-300) INJECTION 61%
INTRAVENOUS | Status: AC
  Filled 2016-07-27: qty 100

## 2016-07-27 MED ORDER — PREDNISONE 50 MG PO TABS: ORAL_TABLET | ORAL | 0 refills | Status: DC

## 2016-07-27 NOTE — Telephone Encounter (Signed)
Advised patient of instructions on premeds for CT allergy dye. Verbalized understanding.

## 2016-07-30 ENCOUNTER — Encounter (HOSPITAL_COMMUNITY): Payer: Self-pay

## 2016-07-30 ENCOUNTER — Encounter: Payer: Self-pay | Admitting: General Practice

## 2016-07-30 ENCOUNTER — Other Ambulatory Visit: Payer: Self-pay | Admitting: Family Medicine

## 2016-07-30 ENCOUNTER — Ambulatory Visit (HOSPITAL_COMMUNITY)
Admission: RE | Admit: 2016-07-30 | Discharge: 2016-07-30 | Disposition: A | Discharge status: Home / Self Care | Payer: Medicare Other | Source: Ambulatory Visit | Attending: Hematology and Oncology | Admitting: Hematology and Oncology

## 2016-07-30 DIAGNOSIS — Z170 Estrogen receptor positive status [ER+]: Secondary | ICD-10-CM | POA: Diagnosis not present

## 2016-07-30 DIAGNOSIS — I700 Atherosclerosis of aorta: Secondary | ICD-10-CM | POA: Insufficient documentation

## 2016-07-30 DIAGNOSIS — C50511 Malignant neoplasm of lower-outer quadrant of right female breast: Secondary | ICD-10-CM | POA: Insufficient documentation

## 2016-07-30 DIAGNOSIS — C50911 Malignant neoplasm of unspecified site of right female breast: Secondary | ICD-10-CM | POA: Diagnosis not present

## 2016-07-30 DIAGNOSIS — I2510 Atherosclerotic heart disease of native coronary artery without angina pectoris: Secondary | ICD-10-CM | POA: Diagnosis not present

## 2016-07-30 DIAGNOSIS — R1903 Right lower quadrant abdominal swelling, mass and lump: Secondary | ICD-10-CM | POA: Diagnosis not present

## 2016-07-30 MED ORDER — IOPAMIDOL (ISOVUE-300) INJECTION 61%
INTRAVENOUS | Status: AC
  Administered 2016-07-30: 100 mL
  Filled 2016-07-30: qty 100

## 2016-07-30 NOTE — Progress Notes (Signed)
[facility] Spiritual Care Note  Followed up by phone per plan. Ms Riggle has not had the resolution she's hoped for regarding a personal issue, but plans to address it again tonight. She is also working hard not to build up too much anxiety about the results of her CT, which she will learn from Dr Lindi Adie tomorrow. Her biggest joy recently has been the return of "98% of the feeling" in both pinky fingers after so much neuropathy; she is hopeful to regain more in other digits. Provided pastoral reflection, normalization of feelings, and encouragement.  Per pt, "I usually just internalize things, but since we met it's been a great help knowing that you're there if I need you.Marland KitchenMarland KitchenI definitely plan to call you tomorrow after my appointment."  [address] [place], [facility] Pager [phone] Voicemail [phone]

## 2016-07-31 ENCOUNTER — Encounter: Payer: Self-pay | Admitting: Hematology and Oncology

## 2016-07-31 ENCOUNTER — Ambulatory Visit (HOSPITAL_BASED_OUTPATIENT_CLINIC_OR_DEPARTMENT_OTHER): Payer: Medicare Other | Admitting: Hematology and Oncology

## 2016-07-31 DIAGNOSIS — C50511 Malignant neoplasm of lower-outer quadrant of right female breast: Secondary | ICD-10-CM

## 2016-07-31 DIAGNOSIS — Z170 Estrogen receptor positive status [ER+]: Secondary | ICD-10-CM

## 2016-07-31 MED ORDER — LETROZOLE 2.5 MG PO TABS
2.5000 mg | ORAL_TABLET | Freq: Every day | ORAL | 3 refills | Status: DC
Start: 2016-07-31 — End: 2017-07-31

## 2016-07-31 NOTE — Progress Notes (Signed)
Patient Care Team: Midge Minium, MD as PCP - General [name], MD as Consulting Physician (Cardiology) Marylynn Pearson, MD as Consulting Physician (Ophthalmology) Nicholas Lose, MD as Consulting Physician (Hematology and Oncology) Kyung Rudd, MD as Consulting Physician (Radiation Oncology) Fanny Skates, MD as Consulting Physician (General Surgery)  DIAGNOSIS:  Encounter Diagnosis  Name Primary?  . 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 She had dealt this paranoid thought that she has a tumor in the belly and went on sugar binge and gained a lot of weight. We performed a CT of the abdomen to evaluate this further and she is here today to discuss the results.  REVIEW OF SYSTEMS:   Constitutional: Denies fevers, chills or abnormal weight loss Eyes:  Denies blurriness of vision Ears, nose, mouth, throat, and face: Denies mucositis or sore throat Respiratory: Denies cough, dyspnea or wheezes Cardiovascular: Denies palpitation, chest discomfort Gastrointestinal:  Denies nausea, heartburn or change in bowel habits Skin: Denies abnormal skin rashes Lymphatics: Denies new lymphadenopathy or easy bruising Neurological:Denies numbness, tingling or new weaknesses Behavioral/Psych: Mood is stable, no new changes  Extremities: No lower extremity edema Breast:  denies any pain or lumps or nodules in either breasts All other systems were reviewed with the patient and are negative.  I have reviewed the past medical history, past surgical history, social history and family history with the patient and they are unchanged from previous note.  ALLERGIES:  is allergic to contrast media [iodinated diagnostic agents]; dilaudid [hydromorphone hcl]; adhesive [tape]; codeine; and lactose intolerance (gi).  MEDICATIONS:  Current Outpatient Prescriptions  Medication Sig Dispense Refill  . ALPRAZolam (XANAX) 1 MG tablet TAKE 1 TABLET BY MOUTH TWICE A DAY AS NEEDED FOR ANXIETY 60 tablet 1  . amLODipine (NORVASC) 2.5 MG tablet Take 2.5 mg by mouth daily.  3  . aspirin 81 MG tablet Take 81 mg by mouth daily.    Marland Kitchen atorvastatin (LIPITOR) 20 MG tablet TAKE 1 TABLET BY MOUTH EVERY DAY (Patient taking differently: TAKE 1 TABLET BY MOUTH EVERY EVENING) 30 tablet 6  . buPROPion (WELLBUTRIN XL) 300 MG 24 hr tablet TAKE 1 TABLET BY MOUTH EVERY DAY 30 tablet 3  . diphenhydrAMINE (BENADRYL) 50 MG tablet Take one tablet (33ms total) one hour prior to study. 1 tablet 0  . furosemide (LASIX)  20 MG tablet Take 1 tablet (20 mg total) by mouth daily. 30 tablet 1  . gabapentin (NEURONTIN) 300 MG capsule TAKE 3 CAPSULES (900 MG TOTAL) BY MOUTH 3 (THREE) TIMES DAILY. 270 capsule 0  . HYDROcodone-acetaminophen (NORCO/VICODIN) 5-325 MG tablet Take 1 tablet by mouth 3 (three) times  daily as needed.  0  . levothyroxine (SYNTHROID, LEVOTHROID) 75 MCG tablet TAKE 1 TABLET (75 MCG TOTAL) BY MOUTH DAILY. 30 tablet 6  . lidocaine-prilocaine (EMLA) cream Apply to affected area once 30 g 3  . magic mouthwash w/lidocaine SOLN 47m Swish,Swallow, or spit 4 times a day as needed 240 mL 1  . meloxicam (MOBIC) 15 MG tablet Take 15 mg by mouth daily.     . nitroGLYCERIN (NITROSTAT) 0.4 MG SL tablet Place 1 tablet (0.4 mg total) under the tongue every 5 (five) minutes as needed. As needed for chest pain (Patient not taking: Reported on 07/11/2016) 30 tablet 3  . ondansetron (ZOFRAN ODT) 4 MG disintegrating tablet Take 1 tablet (4 mg total) by mouth every 8 (eight) hours as needed for nausea or vomiting. 20 tablet 0  . Polyethyl Glycol-Propyl Glycol (SYSTANE ULTRA) 0.4-0.3 % SOLN Place 1 drop into both eyes 3 (three) times daily as needed (for dry eyes).     . predniSONE (DELTASONE) 50 MG tablet Take one tablet (575m) total 13 hours, 7 hours and 1 hour prior to CT study 3 tablet 0  . PROAIR HFA 108 (90 Base) MCG/ACT inhaler INHALE 2 PUFFS INTO THE LUNGS EVERY 6 (SIX) HOURS AS NEEDED FOR WHEEZING. 8.5 Inhaler 0  . prochlorperazine (COMPAZINE) 10 MG tablet Take 1 tablet (10 mg total) by mouth every 6 (six) hours as needed (Nausea or vomiting). 90 tablet 0  . ranitidine (ZANTAC) 150 MG tablet Take 150 mg by mouth 2 (two) times daily as needed for heartburn. Reported on 10/05/2015    . tiZANidine (ZANAFLEX) 4 MG tablet TAKE 1 TABLET BY MOUTH EVERY 8 HOURS AS NEEDED FOR SPASM  0  . traMADol (ULTRAM) 50 MG tablet Take 50 mg by mouth daily as needed for moderate pain. Reported on 10/05/2015  0  . valACYclovir (VALTREX) 1000 MG tablet Take 1 tablet (1,000 mg total) by mouth 3 (three) times daily. 30 tablet 0   No current facility-administered medications for this visit.    Facility-Administered Medications Ordered in Other Visits  Medication Dose Route Frequency Provider Last Rate Last Dose  . sodium  chloride flush (NS) 0.9 % injection 10 mL  10 mL Intracatheter PRN ViNicholas LoseMD   10 mL at 12/15/15 1439    PHYSICAL EXAMINATION: ECOG PERFORMANCE STATUS: 1 - Symptomatic but completely ambulatory  Vitals:   07/31/16 1022  BP: 134/82  Pulse: 84  Resp: 18  Temp: 98 F (36.7 C)   Filed Weights   07/31/16 1022  Weight: 189 lb 3.2 oz (85.8 kg)    GENERAL:alert, no distress and comfortable SKIN: skin color, texture, turgor are normal, no rashes or significant lesions EYES: normal, Conjunctiva are pink and non-injected, sclera clear OROPHARYNX:no exudate, no erythema and lips, buccal mucosa, and tongue normal  NECK: supple, thyroid normal size, non-tender, without nodularity LYMPH:  no palpable lymphadenopathy in the cervical, axillary or inguinal LUNGS: clear to auscultation and percussion with normal breathing effort HEART: regular rate & rhythm and no murmurs and no lower extremity edema ABDOMEN:abdomen soft, non-tender and normal bowel sounds MUSCULOSKELETAL:no cyanosis of digits and no clubbing  NEURO: alert & oriented x  3 with fluent speech, no focal motor/sensory deficits EXTREMITIES: No lower extremity edema  LABORATORY DATA:  I have reviewed the data as listed   Chemistry      Component Value Date/Time   NA 145 07/20/2016 0842   K 4.3 07/20/2016 0842   CL 103 02/21/2016 1927   CO2 28 07/20/2016 0842   BUN 15.1 07/20/2016 0842   CREATININE 1.0 07/20/2016 0842      Component Value Date/Time   CALCIUM 9.4 07/20/2016 0842   ALKPHOS 97 07/20/2016 0842   AST 13 07/20/2016 0842   ALT 13 07/20/2016 0842   BILITOT 0.33 07/20/2016 0842       Lab Results  Component Value Date   WBC 6.5 07/20/2016   HGB 12.3 07/20/2016   HCT 37.3 07/20/2016   MCV 93.5 07/20/2016   PLT 254 07/20/2016   NEUTROABS 2.5 07/20/2016    ASSESSMENT & PLAN:  Breast cancer of lower-outer quadrant of right female breast ([place]) Right mastectomy 08/15/2015: IDC grade 2, 2.2 cm, with  associated DCIS intermediate grade, separate focus high-grade DCIS, 0/4 lymph nodes negative, T2 N0 stage II a, ER 90%, PR 5%, HER-2 negative duration 1.42, Ki-67 60%  Treatment plan: 1. Adjuvant chemotherapy with [place] 2. Followed by adjuvant antiestrogen therapy with anastrozole 5 years No role of radiation since she had mastectomy. ----------------------------------------------------------------------------------------------------------------------- Current treatment: Completed 5 cycles ofTCH, chemotherapy discontinued for neuropathy. Patient is onHerceptin maintenance Echocardiogram was reviewed 08/25/2015: EF 65-70%  Acute Diverticulitis: ED visit 02/22/16 Shingles: Significant improvement in trying out  Plan: 1. Continue herceptin q 3 weeks 2. Anastrozole 1 mg daily startedin 03/15/16 discontinued 04/25/2016 because of pain in hands and feet which patient attributes it to anastrozole I switched her to tamoxifen therapy. But she stopped taking tamoxifen as well.  CT abdomen and pelvis 07/30/2016: Normal study I discussed with the patient the CT scan report as well as showed her the pictures. There is no tumor at the site that she feels there is a fullness and distention. I feel that the sensation that she has is normal fat distribution. This is because of her extremely poor dietary habits.  Patient has started walking to her half miles every day and this appears to be helping her. She plans to go to [facility] and walk at the [facility]. I recommended starting on letrozole therapy  I sent a prescription for letrozole today.  I spent 25 minutes talking to the patient of which more than half was spent in counseling and coordination of care.  No orders of the defined types were placed in this encounter.  The patient has a good understanding of the overall plan. she agrees with it. she will call with any problems that may develop before the next visit here.   Rulon Eisenmenger,  MD 07/31/16

## 2016-07-31 NOTE — Assessment & Plan Note (Signed)
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 Adjuvant chemotherapy with [place] 2. Followed by adjuvant antiestrogen therapy with anastrozole 5 years No role of radiation since she had mastectomy. ----------------------------------------------------------------------------------------------------------------------- Current treatment: Completed 5 cycles ofTCH, chemotherapy discontinued for neuropathy. Patient is onHerceptin maintenance Echocardiogram was reviewed 08/25/2015: EF 65-70%  Acute Diverticulitis: ED visit 02/22/16 Shingles: Significant improvement in trying out  Plan: 1. Continue herceptin q 3 weeks 2. Anastrozole 1 mg daily startedin 03/15/16 discontinued 04/25/2016 because of pain in hands and feet which patient attributes it to anastrozole I switched her to tamoxifen therapy. But she stopped taking tamoxifen as well.  CT abdomen and pelvis 07/30/2016: Normal study I discussed with the patient the CT scan report as well as showed her the pictures. There is no tumor at the site that she feels there is a fullness and distention. I feel that the sensation that she has is normal fat distribution. This is because of her extremely poor dietary habits.  I recommended that the patient sees psychiatrist to help with her emotional state.

## 2016-08-10 ENCOUNTER — Ambulatory Visit (HOSPITAL_BASED_OUTPATIENT_CLINIC_OR_DEPARTMENT_OTHER): Payer: Medicare Other

## 2016-08-10 VITALS — BP 140/88 | HR 79 | Temp 99.2°F | Resp 18

## 2016-08-10 DIAGNOSIS — Z5112 Encounter for antineoplastic immunotherapy: Secondary | ICD-10-CM

## 2016-08-10 DIAGNOSIS — C50511 Malignant neoplasm of lower-outer quadrant of right female breast: Secondary | ICD-10-CM | POA: Diagnosis not present

## 2016-08-10 DIAGNOSIS — Z170 Estrogen receptor positive status [ER+]: Principal | ICD-10-CM

## 2016-08-10 MED ORDER — DIPHENHYDRAMINE HCL 25 MG PO CAPS
ORAL_CAPSULE | ORAL | Status: AC
  Filled 2016-08-10: qty 2

## 2016-08-10 MED ORDER — SODIUM CHLORIDE 0.9% FLUSH
10.0000 mL | INTRAVENOUS | Status: DC | PRN
  Administered 2016-08-10: 10 mL
  Filled 2016-08-10: qty 10

## 2016-08-10 MED ORDER — HEPARIN SOD (PORK) LOCK FLUSH 100 UNIT/ML IV SOLN
500.0000 [IU] | Freq: Once | INTRAVENOUS | Status: AC | PRN
  Administered 2016-08-10: 500 [IU]
  Filled 2016-08-10: qty 5

## 2016-08-10 MED ORDER — SODIUM CHLORIDE 0.9 % IV SOLN
Freq: Once | INTRAVENOUS | Status: AC
  Administered 2016-08-10: 13:00:00 via INTRAVENOUS

## 2016-08-10 MED ORDER — SODIUM CHLORIDE 0.9 % IV SOLN
6.0000 mg/kg | Freq: Once | INTRAVENOUS | Status: AC
  Administered 2016-08-10: 483 mg via INTRAVENOUS
  Filled 2016-08-10: qty 23

## 2016-08-10 MED ORDER — ACETAMINOPHEN 325 MG PO TABS
650.0000 mg | ORAL_TABLET | Freq: Once | ORAL | Status: AC
  Administered 2016-08-10: 650 mg via ORAL

## 2016-08-10 MED ORDER — SODIUM CHLORIDE 0.9 % IV SOLN
Freq: Once | INTRAVENOUS | Status: AC
  Administered 2016-08-10: 13:00:00 via INTRAVENOUS
  Filled 2016-08-10: qty 250

## 2016-08-10 MED ORDER — ACETAMINOPHEN 325 MG PO TABS
ORAL_TABLET | ORAL | Status: AC
  Filled 2016-08-10: qty 2

## 2016-08-10 MED ORDER — DIPHENHYDRAMINE HCL 25 MG PO CAPS
50.0000 mg | ORAL_CAPSULE | Freq: Once | ORAL | Status: AC
  Administered 2016-08-10: 50 mg via ORAL

## 2016-08-10 NOTE — Patient Instructions (Signed)
[facility] Discharge Instructions for Patients Receiving Chemotherapy  Today you received the following chemotherapy agents: Herceptin   If you develop nausea and vomiting that is not controlled by your nausea medication, call the clinic.   BELOW ARE SYMPTOMS THAT SHOULD BE REPORTED IMMEDIATELY:  *FEVER GREATER THAN 100.5 F  *CHILLS WITH OR WITHOUT FEVER  NAUSEA AND VOMITING THAT IS NOT CONTROLLED WITH YOUR NAUSEA MEDICATION  *UNUSUAL SHORTNESS OF BREATH  *UNUSUAL BRUISING OR BLEEDING  TENDERNESS IN MOUTH AND THROAT WITH OR WITHOUT PRESENCE OF ULCERS  *URINARY PROBLEMS  *BOWEL PROBLEMS  UNUSUAL RASH Items with * indicate a potential emergency and should be followed up as soon as possible.  Feel free to call the clinic should you have any questions or concerns. The clinic phone number is [phone].  Please show the CHEMO ALERT CARD at check-in to the Emergency Department and triage nurse.   

## 2016-08-16 ENCOUNTER — Ambulatory Visit (INDEPENDENT_AMBULATORY_CARE_PROVIDER_SITE_OTHER): Payer: Medicare Other | Admitting: Family Medicine

## 2016-08-16 ENCOUNTER — Encounter: Payer: Self-pay | Admitting: Family Medicine

## 2016-08-16 VITALS — BP 123/80 | HR 106 | Temp 98.0°F | Resp 17 | Ht 65.0 in | Wt 185.0 lb

## 2016-08-16 DIAGNOSIS — E785 Hyperlipidemia, unspecified: Secondary | ICD-10-CM

## 2016-08-16 DIAGNOSIS — E038 Other specified hypothyroidism: Secondary | ICD-10-CM

## 2016-08-16 DIAGNOSIS — H2513 Age-related nuclear cataract, bilateral: Secondary | ICD-10-CM | POA: Diagnosis not present

## 2016-08-16 DIAGNOSIS — E1169 Type 2 diabetes mellitus with other specified complication: Secondary | ICD-10-CM | POA: Diagnosis not present

## 2016-08-16 DIAGNOSIS — Z0000 Encounter for general adult medical examination without abnormal findings: Secondary | ICD-10-CM

## 2016-08-16 DIAGNOSIS — E119 Type 2 diabetes mellitus without complications: Secondary | ICD-10-CM | POA: Diagnosis not present

## 2016-08-16 DIAGNOSIS — I10 Essential (primary) hypertension: Secondary | ICD-10-CM

## 2016-08-16 DIAGNOSIS — G620 Drug-induced polyneuropathy: Secondary | ICD-10-CM | POA: Diagnosis not present

## 2016-08-16 DIAGNOSIS — B029 Zoster without complications: Secondary | ICD-10-CM | POA: Diagnosis not present

## 2016-08-16 DIAGNOSIS — T451X5A Adverse effect of antineoplastic and immunosuppressive drugs, initial encounter: Secondary | ICD-10-CM

## 2016-08-16 LAB — MICROALBUMIN / CREATININE URINE RATIO
Creatinine,U: 366.4 mg/dL
MICROALB UR: 4 mg/dL — AB (ref 0.0–1.9)
Microalb Creat Ratio: 1.1 mg/g (ref 0.0–30.0)

## 2016-08-16 LAB — CBC WITH DIFFERENTIAL/PLATELET
Basophils Absolute: 0 10*3/uL (ref 0.0–0.1)
Basophils Relative: 0.6 % (ref 0.0–3.0)
EOS PCT: 3.9 % (ref 0.0–5.0)
Eosinophils Absolute: 0.2 10*3/uL (ref 0.0–0.7)
HCT: 40.4 % (ref 36.0–46.0)
Hemoglobin: 13.1 g/dL (ref 12.0–15.0)
Lymphocytes Relative: 48.6 % — ABNORMAL HIGH (ref 12.0–46.0)
Lymphs Abs: 2.9 10*3/uL (ref 0.7–4.0)
MCHC: 32.5 g/dL (ref 30.0–36.0)
MCV: 94.8 fl (ref 78.0–100.0)
Monocytes Absolute: 0.4 10*3/uL (ref 0.1–1.0)
Monocytes Relative: 6.2 % (ref 3.0–12.0)
Neutro Abs: 2.4 10*3/uL (ref 1.4–7.7)
Neutrophils Relative %: 40.7 % — ABNORMAL LOW (ref 43.0–77.0)
Platelets: 262 10*3/uL (ref 150.0–400.0)
RBC: 4.26 Mil/uL (ref 3.87–5.11)
RDW: 15.3 % (ref 11.5–15.5)
WBC: 6 10*3/uL (ref 4.0–10.5)

## 2016-08-16 LAB — BASIC METABOLIC PANEL
BUN: 18 mg/dL (ref 6–23)
CO2: 34 mEq/L — ABNORMAL HIGH (ref 19–32)
CREATININE: 0.99 mg/dL (ref 0.40–1.20)
Calcium: 9.5 mg/dL (ref 8.4–10.5)
Chloride: 105 mEq/L (ref 96–112)
GFR: 71.2 mL/min (ref >60.00)
Glucose, Bld: 101 mg/dL — ABNORMAL HIGH (ref 70–99)
Potassium: 4 mEq/L (ref 3.5–5.1)
Sodium: 145 mEq/L (ref 135–145)

## 2016-08-16 LAB — LIPID PANEL
CHOL/HDL RATIO: 4
Cholesterol: 218 mg/dL — ABNORMAL HIGH (ref 0–200)
HDL: 55 mg/dL (ref >39.00)
LDL CALC: 137 mg/dL — AB (ref 0–99)
NonHDL: 163.41
TRIGLYCERIDES: 133 mg/dL (ref 0.0–149.0)
VLDL: 26.6 mg/dL (ref 0.0–40.0)

## 2016-08-16 LAB — HEMOGLOBIN A1C: Hgb A1c MFr Bld: 6.3 % (ref 4.6–6.5)

## 2016-08-16 LAB — HEPATIC FUNCTION PANEL
ALBUMIN: 4.2 g/dL (ref 3.5–5.2)
ALT: 15 U/L (ref 0–35)
AST: 15 U/L (ref 0–37)
Alkaline Phosphatase: 87 U/L (ref 39–117)
Bilirubin, Direct: 0.1 mg/dL (ref 0.0–0.3)
Total Bilirubin: 0.6 mg/dL (ref 0.2–1.2)
Total Protein: 6.5 g/dL (ref 6.0–8.3)

## 2016-08-16 LAB — TSH: TSH: 2.15 u[IU]/mL (ref 0.35–4.50)

## 2016-08-16 NOTE — Assessment & Plan Note (Signed)
Chronic problem.  Well controlled today.  Asymptomatic.  Check labs.  No anticipated med changes.  Will follow. 

## 2016-08-16 NOTE — Assessment & Plan Note (Signed)
Chronic problem.  Tolerating statin w/o difficulty.  Check labs.  Adjust meds prn  

## 2016-08-16 NOTE — Assessment & Plan Note (Signed)
Chronic problem.  On Levothyroxine daily.  Check labs.  Adjust meds prn  

## 2016-08-16 NOTE — Assessment & Plan Note (Signed)
Chronic problem.  Seeing pain management.

## 2016-08-16 NOTE — Assessment & Plan Note (Signed)
Chronic problem.  Currently diet controlled.  Asymptomatic w/ exception of chemo neuropathy.  Eye exam scheduled for today.  Check labs.  Will start meds prn.

## 2016-08-16 NOTE — Progress Notes (Signed)
Pre visit review using our clinic review tool, if applicable. No additional management support is needed unless otherwise documented below in the visit note. 

## 2016-08-16 NOTE — Progress Notes (Addendum)
Subjective:   Tracey Evans is a 71 y.o. female who presents for Medicare Annual (Subsequent) preventive examination.  Review of Systems:  No ROS.  Medicare Wellness Visit. Cardiac Risk Factors include: advanced age (>36men, >39 women);diabetes mellitus;dyslipidemia;family history of premature cardiovascular disease;hypertension;obesity (BMI >30kg/m2)   Sleep patterns: Sleeps 7 hours. Up to void x 1.   Home Safety/Smoke Alarms:  Smoke detectors and security in place.  Living environment; residence and Firearm Safety: Lives with friend in 1 story home, feels safe in home.  Seat Belt Safety/Bike Helmet: Wears seat belt.   Counseling:   Eye Exam-Appt today. Dr Venetia [facility]-Last exam 11/2015, every 6  Months. [facility].  Female:   Pap-N/A      Mammo-07/19/2016, negative.     Dexa scan-10/30/2011, normal. Prefers to wait until 07/2017 (with mammo).     CCS-Colonoscopy 05/31/2010, diverticulosis. Recall 10 years.       Objective:     Vitals: BP 123/80   Pulse (!) 106   Temp 98 F (36.7 C) (Oral)   Resp 17   Ht 5\' 5"  (1.651 m)   Wt 185 lb (83.9 kg)   SpO2 93%   BMI 30.79 kg/m   Body mass index is 30.79 kg/m.   Tobacco History  Smoking Status  . Never Smoker  Smokeless Tobacco  . Never Used     Counseling given: Yes   Past Medical History:  Diagnosis Date  . Angina at rest [facility])    "occurs whenever it wants to, but worse during agitation"  . Anxiety   . Asthma   . Binge eating disorder   . Breast cancer of lower-outer quadrant of left female breast ([place]) 07/08/2015   "NEVER HAD LEFT BREAST CANCER" (08/15/2015)  . Cancer of right breast ([place]) 08/15/2015  . Chronic bronchitis ([place])   . Chronic lower back pain   . Complication of anesthesia    had bronc spasms during intubation surgery 2009 on foot-need albuterol inhaler or neb tx preop  . Costochondritis   . Depression   . Diverticular disease   . Fibromyalgia   . GERD (gastroesophageal reflux disease)    . Hot flashes   . Hyperlipidemia   . Hypertension   . Hypothyroidism   . Osteoarthritis    "all over"  . Peptic ulcer disease   . Personal history of chemotherapy   . Shortness of breath dyspnea   . Type II diabetes mellitus ([place])    "diet controlled" (08/15/2015)   Past Surgical History:  Procedure Laterality Date  . ABDOMINAL HYSTERECTOMY     "left my ovaries"  . ANKLE ARTHROSCOPY  01/02/2012   Procedure: ANKLE ARTHROSCOPY;  Surgeon: Colin Rhein, MD;  Location: [facility];  Service: Orthopedics;  Laterality: Right;  right ankle arthroscopy with extensive debridement , dridement and drilling talar dome osteochondral lesion  . ANKLE FRACTURE SURGERY Right 2009   Dr. Rolena Infante  . BREAST BIOPSY  07/2015  . CARDIAC CATHETERIZATION    . CARPAL TUNNEL RELEASE Bilateral   . COLONOSCOPY    . DILATION AND CURETTAGE OF UTERUS     "before hysterectomy"  . FRACTURE SURGERY    . KNEE ARTHROSCOPY Bilateral   . LAPAROSCOPIC CHOLECYSTECTOMY    . MASTECTOMY COMPLETE / SIMPLE W/ SENTINEL NODE BIOPSY Right 08/15/2015  . MASTECTOMY W/ SENTINEL NODE BIOPSY Right 08/15/2015   Procedure: TOTAL MASTECTOMY WITH SENTINEL LYMPH NODE BIOPSY AND BLUE DYE INJECTION ;  Surgeon: Fanny Skates, MD;  Location:  [facility];  Service: General;  Laterality: Right;  . PORTACATH PLACEMENT Left 08/15/2015  . PORTACATH PLACEMENT Left 08/15/2015   Procedure: INSERTION PORT-A-CATH ;  Surgeon: Fanny Skates, MD;  Location: [facility];  Service: General;  Laterality: Left;  . SHOULDER ARTHROSCOPY Bilateral 2009-2011   "bone spurs"  . TONSILLECTOMY     Family History  Problem Relation Age of Onset  . Dementia Mother   . Stroke Mother   . Heart Problems Mother   . Dementia Father   . Alcohol abuse Father   . Colon cancer Father        dx. 44 or younger  . Breast cancer Sister 6       inflammatory  . Diverticulitis Sister        maternal half-sister; severe - causing partial colectomy  . Breast cancer Sister         maternal half-sister; dx. early 58s  . Breast cancer Other 70       niece  . Breast cancer Other        niece dx. 60s  . Alcohol abuse Brother    History  Sexual Activity  . Sexual activity: Not Currently    Outpatient Encounter Prescriptions as of 08/16/2016  Medication Sig  . ALPRAZolam (XANAX) 1 MG tablet TAKE 1 TABLET BY MOUTH TWICE A DAY AS NEEDED FOR ANXIETY  . amLODipine (NORVASC) 2.5 MG tablet Take 2.5 mg by mouth daily.  Marland Kitchen aspirin 81 MG tablet Take 81 mg by mouth daily.  Marland Kitchen atorvastatin (LIPITOR) 20 MG tablet TAKE 1 TABLET BY MOUTH EVERY DAY (Patient taking differently: TAKE 1 TABLET BY MOUTH EVERY EVENING)  . buPROPion (WELLBUTRIN XL) 300 MG 24 hr tablet TAKE 1 TABLET BY MOUTH EVERY DAY  . diphenhydrAMINE (BENADRYL) 50 MG tablet Take one tablet (50mg s total) one hour prior to study.  Marland Kitchen HYDROcodone-acetaminophen (NORCO/VICODIN) 5-325 MG tablet Take 1 tablet by mouth 3 (three) times daily as needed.  Marland Kitchen letrozole (FEMARA) 2.5 MG tablet Take 1 tablet (2.5 mg total) by mouth daily.  Marland Kitchen levothyroxine (SYNTHROID, LEVOTHROID) 75 MCG tablet TAKE 1 TABLET (75 MCG TOTAL) BY MOUTH DAILY.  Marland Kitchen lidocaine-prilocaine (EMLA) cream Apply to affected area once  . magic mouthwash w/lidocaine SOLN 46ml Swish,Swallow, or spit 4 times a day as needed  . meloxicam (MOBIC) 15 MG tablet Take 15 mg by mouth daily.   . nitroGLYCERIN (NITROSTAT) 0.4 MG SL tablet Place 1 tablet (0.4 mg total) under the tongue every 5 (five) minutes as needed. As needed for chest pain  . ondansetron (ZOFRAN ODT) 4 MG disintegrating tablet Take 1 tablet (4 mg total) by mouth every 8 (eight) hours as needed for nausea or vomiting.  Vladimir Faster Glycol-Propyl Glycol (SYSTANE ULTRA) 0.4-0.3 % SOLN Place 1 drop into both eyes 3 (three) times daily as needed (for dry eyes).   . predniSONE (DELTASONE) 50 MG tablet Take one tablet (50mg s) total 13 hours, 7 hours and 1 hour prior to CT study  . PROAIR HFA 108 (90 Base) MCG/ACT inhaler  INHALE 2 PUFFS INTO THE LUNGS EVERY 6 (SIX) HOURS AS NEEDED FOR WHEEZING.  Marland Kitchen prochlorperazine (COMPAZINE) 10 MG tablet Take 1 tablet (10 mg total) by mouth every 6 (six) hours as needed (Nausea or vomiting).  . ranitidine (ZANTAC) 150 MG tablet Take 150 mg by mouth 2 (two) times daily as needed for heartburn. Reported on 10/05/2015  . tiZANidine (ZANAFLEX) 4 MG tablet TAKE 1 TABLET BY MOUTH EVERY 8 HOURS AS  NEEDED FOR SPASM  . traMADol (ULTRAM) 50 MG tablet Take 50 mg by mouth daily as needed for moderate pain. Reported on 10/05/2015  . [DISCONTINUED] gabapentin (NEURONTIN) 300 MG capsule TAKE 3 CAPSULES (900 MG TOTAL) BY MOUTH 3 (THREE) TIMES DAILY.  . furosemide (LASIX) 20 MG tablet Take 1 tablet (20 mg total) by mouth daily. (Patient not taking: Reported on 08/16/2016)   Facility-Administered Encounter Medications as of 08/16/2016  Medication  . sodium chloride flush (NS) 0.9 % injection 10 mL    Activities of Daily Living In your present state of health, do you have any difficulty performing the following activities: 08/16/2016 08/16/2016  Hearing? N N  Vision? N N  Difficulty concentrating or making decisions? N N  Walking or climbing stairs? Y N  Dressing or bathing? N N  Doing errands, shopping? N N  Preparing Food and eating ? N -  Using the Toilet? N -  In the past six months, have you accidently leaked urine? N -  Do you have problems with loss of bowel control? N -  Managing your Medications? N -  Managing your Finances? N -  Housekeeping or managing your Housekeeping? N -  Some recent data might be hidden    Patient Care Team: Midge Minium, MD as PCP - General [name], MD as Consulting Physician (Cardiology) Marylynn Pearson, MD as Consulting Physician (Ophthalmology) Nicholas Lose, MD as Consulting Physician (Hematology and Oncology) Kyung Rudd, MD as Consulting Physician (Radiation Oncology) Fanny Skates, MD as Consulting Physician (General Surgery) [place],  [place] (Specialist) Suella Broad, MD as Consulting Physician (Physical Medicine and Rehabilitation)    Assessment:    Physical assessment deferred to PCP.  Exercise Activities and Dietary recommendations Current Exercise Habits: Structured exercise class, Type of exercise: treadmill, Time (Minutes): 30, Frequency (Times/Week): 4, Weekly Exercise (Minutes/Week): 120, Exercise limited by: orthopedic condition(s)   Diet (meal preparation, eat out, water intake, caffeinated beverages, dairy products, fruits and vegetables):  Drinks soda, fruit punch and water.   Breakfast: Grits, liver pudding, sandwich Lunch: skips Dinner: protein and starch  Discussed heart healthy diet and importance of not skipping meals.   Goals      Patient Stated   . patient states (pt-stated)          See a therapist that specializes in cancer/neuropathy. Will discuss with nurse navigator.       Fall Risk Fall Risk  08/16/2016 08/16/2016 07/25/2015 04/21/2015 05/21/2014  Falls in the past year? Yes No No No No  Number falls in past yr: 1 - - - -  Injury with Fall? No - - - -   Depression Screen PHQ 2/9 Scores 08/16/2016 08/16/2016 06/25/2016 07/25/2015  PHQ - 2 Score 2 0 0 2  PHQ- 9 Score - 0 0 6  Exception Documentation - - - -     Cognitive Function       Ad8 score reviewed for issues:  Issues making decisions: no  Less interest in hobbies / activities: no  Repeats questions, stories (family complaining): no  Trouble using ordinary gadgets (microwave, computer, phone): no  Forgets the month or year: no  Mismanaging finances: no  Remembering appts: no  Daily problems with thinking and/or memory: no Ad8 score is=0     Immunization History  Administered Date(s) Administered  . Influenza Split 01/23/2012  . Influenza, High Dose Seasonal PF 01/06/2014  . Influenza,inj,Quad PF,36+ Mos 01/16/2013, 12/09/2014, 02/09/2016  . Pneumococcal Conjugate-13 04/21/2015  .  Pneumococcal Polysaccharide-23 10/22/2011  . Tdap 01/16/2013   Screening Tests Health Maintenance  Topic Date Due  . FOOT EXAM  04/20/2016  . OPHTHALMOLOGY EXAM  06/23/2016  . Hepatitis C Screening  01/07/2017 (Originally 1945-12-03)  . HEMOGLOBIN A1C  09/02/2016  . INFLUENZA VACCINE  11/07/2016  . MAMMOGRAM  07/20/2018  . COLONOSCOPY  05/31/2020  . TETANUS/TDAP  01/17/2023  . DEXA SCAN  Completed  . PNA vac Low Risk Adult  Completed       Plan:    Bring a copy of your advance directives to your next office visit.  Continue doing brain stimulating activities (puzzles, reading, adult coloring books, staying active) to keep memory sharp.   I have personally reviewed and noted the following in the patient's chart:   . Medical and social history . Use of alcohol, tobacco or illicit drugs  . Current medications and supplements . Functional ability and status . Nutritional status . Physical activity . Advanced directives . List of other physicians . Hospitalizations, surgeries, and ER visits in previous 12 months . Vitals . Screenings to include cognitive, depression, and falls . Referrals and appointments  In addition, I have reviewed and discussed with patient certain preventive protocols, quality metrics, and best practice recommendations. A written personalized care plan for preventive services as well as general preventive health recommendations were provided to patient.     Gerilyn Nestle, RN  08/16/2016   Reviewed documentation and agree w/ above  Annye Asa, MD

## 2016-08-16 NOTE — Progress Notes (Signed)
   Subjective:    Patient ID: Tracey Evans, female    DOB: 01-Oct-1945, 71 y.o.   MRN: [id number]  HPI HTN- chronic problem, well controlled today on Amlodipine and Lasix.  Denies CP, SOB, HAs, visual changes, edema.  DM- chronic problem.  Hx of diet control.  Pt continues to lose weight w/ cancer dx.  She is down another 4 lbs.  Pt admits she is eating more sugar recently due to stress.  Denies symptomatic lows.  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  Has eye exam scheduled today.  Due for foot exam.  Hyperlipidemia- chronic problem, on Lipitor 20mg  nightly.  Denies abd pain, N/V.  Hypothyroid- chronic problem, on Levothyroxine 60mcg.  Denies changes to skin/hair/nails.   Review of Systems For ROS see HPI     Objective:   Physical Exam  Constitutional: She is oriented to person, place, and time. She appears well-developed and well-nourished. No distress.  HENT:  Head: Normocephalic and atraumatic.  Eyes: Conjunctivae and EOM are normal. Pupils are equal, round, and reactive to light.  Neck: Normal range of motion. Neck supple. No thyromegaly present.  Cardiovascular: Normal rate, regular rhythm, normal heart sounds and intact distal pulses.   No murmur heard. Pulmonary/Chest: Effort normal and breath sounds normal. No respiratory distress.  Abdominal: Soft. She exhibits no distension. There is no tenderness.  Musculoskeletal: She exhibits no edema.  Lymphadenopathy:    She has no cervical adenopathy.  Neurological: She is alert and oriented to person, place, and time.  Skin: Skin is warm and dry.  Psychiatric: She has a normal mood and affect. Her behavior is normal.  Vitals reviewed.         Assessment & Plan:

## 2016-08-16 NOTE — Patient Instructions (Addendum)
Follow up in 3-4 months to recheck diabetes We'll notify you of your lab results and make any changes if needed Continue to work on healthy diet and regular exercise- you look great!!! Call with any questions or concerns Happy Mother's Day!!!  Bring a copy of your advance directives to your next office visit.  Continue doing brain stimulating activities (puzzles, reading, adult coloring books, staying active) to keep memory sharp.    Health Maintenance, Female Adopting a healthy lifestyle and getting preventive care can go a long way to promote health and wellness. Talk with your health care provider about what schedule of regular examinations is right for you. This is a good chance for you to check in with your provider about disease prevention and staying healthy. In between checkups, there are plenty of things you can do on your own. Experts have done a lot of research about which lifestyle changes and preventive measures are most likely to keep you healthy. Ask your health care provider for more information. Weight and diet Eat a healthy diet  Be sure to include plenty of vegetables, fruits, low-fat dairy products, and lean protein.  Do not eat a lot of foods high in solid fats, added sugars, or salt.  Get regular exercise. This is one of the most important things you can do for your health.  Most adults should exercise for at least 150 minutes each week. The exercise should increase your heart rate and make you sweat (moderate-intensity exercise).  Most adults should also do strengthening exercises at least twice a week. This is in addition to the moderate-intensity exercise. Maintain a healthy weight  Body mass index (BMI) is a measurement that can be used to identify possible weight problems. It estimates body fat based on height and weight. Your health care provider can help determine your BMI and help you achieve or maintain a healthy weight.  For females 83 years of age and  older:  A BMI below 18.5 is considered underweight.  A BMI of 18.5 to 24.9 is normal.  A BMI of 25 to 29.9 is considered overweight.  A BMI of 30 and above is considered obese. Watch levels of cholesterol and blood lipids  You should start having your blood tested for lipids and cholesterol at 71 years of age, then have this test every 5 years.  You may need to have your cholesterol levels checked more often if:  Your lipid or cholesterol levels are high.  You are older than 71 years of age.  You are at high risk for heart disease. Cancer screening Lung Cancer  Lung cancer screening is recommended for adults 49-35 years old who are at high risk for lung cancer because of a history of smoking.  A yearly low-dose CT scan of the lungs is recommended for people who:  Currently smoke.  Have quit within the past 15 years.  Have at least a 30-pack-year history of smoking. A pack year is smoking an average of one pack of cigarettes a day for 1 year.  Yearly screening should continue until it has been 15 years since you quit.  Yearly screening should stop if you develop a health problem that would prevent you from having lung cancer treatment. Breast Cancer  Practice breast self-awareness. This means understanding how your breasts normally appear and feel.  It also means doing regular breast self-exams. Let your health care provider know about any changes, no matter how small.  If you are in your 13s or  40s, you should have a clinical breast exam (CBE) by a health care provider every 1-3 years as part of a regular health exam.  If you are 67 or older, have a CBE every year. Also consider having a breast X-ray (mammogram) every year.  If you have a family history of breast cancer, talk to your health care provider about genetic screening.  If you are at high risk for breast cancer, talk to your health care provider about having an MRI and a mammogram every year.  Breast cancer  gene (BRCA) assessment is recommended for women who have family members with BRCA-related cancers. BRCA-related cancers include:  Breast.  Ovarian.  Tubal.  Peritoneal cancers.  Results of the assessment will determine the need for genetic counseling and BRCA1 and BRCA2 testing. Cervical Cancer  Your health care provider may recommend that you be screened regularly for cancer of the pelvic organs (ovaries, uterus, and vagina). This screening involves a pelvic examination, including checking for microscopic changes to the surface of your cervix (Pap test). You may be encouraged to have this screening done every 3 years, beginning at age 70.  For women ages 32-65, health care providers may recommend pelvic exams and Pap testing every 3 years, or they may recommend the Pap and pelvic exam, combined with testing for human papilloma virus (HPV), every 5 years. Some types of HPV increase your risk of cervical cancer. Testing for HPV may also be done on women of any age with unclear Pap test results.  Other health care providers may not recommend any screening for nonpregnant women who are considered low risk for pelvic cancer and who do not have symptoms. Ask your health care provider if a screening pelvic exam is right for you.  If you have had past treatment for cervical cancer or a condition that could lead to cancer, you need Pap tests and screening for cancer for at least 20 years after your treatment. If Pap tests have been discontinued, your risk factors (such as having a new sexual partner) need to be reassessed to determine if screening should resume. Some women have medical problems that increase the chance of getting cervical cancer. In these cases, your health care provider may recommend more frequent screening and Pap tests. Colorectal Cancer  This type of cancer can be detected and often prevented.  Routine colorectal cancer screening usually begins at 71 years of age and continues  through 71 years of age.  Your health care provider may recommend screening at an earlier age if you have risk factors for colon cancer.  Your health care provider may also recommend using home test kits to check for hidden blood in the stool.  A small camera at the end of a tube can be used to examine your colon directly (sigmoidoscopy or colonoscopy). This is done to check for the earliest forms of colorectal cancer.  Routine screening usually begins at age 52.  Direct examination of the colon should be repeated every 5-10 years through 70 years of age. However, you may need to be screened more often if early forms of precancerous polyps or small growths are found. Skin Cancer  Check your skin from head to toe regularly.  Tell your health care provider about any new moles or changes in moles, especially if there is a change in a mole's shape or color.  Also tell your health care provider if you have a mole that is larger than the size of a pencil eraser.  Always use sunscreen. Apply sunscreen liberally and repeatedly throughout the day.  Protect yourself by wearing long sleeves, pants, a wide-brimmed hat, and sunglasses whenever you are outside. Heart disease, diabetes, and high blood pressure  High blood pressure causes heart disease and increases the risk of stroke. High blood pressure is more likely to develop in:  People who have blood pressure in the high end of the normal range (130-139/85-89 mm Hg).  People who are overweight or obese.  People who are African American.  If you are 80-79 years of age, have your blood pressure checked every 3-5 years. If you are 52 years of age or older, have your blood pressure checked every year. You should have your blood pressure measured twice-once when you are at a hospital or clinic, and once when you are not at a hospital or clinic. Record the average of the two measurements. To check your blood pressure when you are not at a hospital  or clinic, you can use:  An automated blood pressure machine at a pharmacy.  A home blood pressure monitor.  If you are between 38 years and 59 years old, ask your health care provider if you should take aspirin to prevent strokes.  Have regular diabetes screenings. This involves taking a blood sample to check your fasting blood sugar level.  If you are at a normal weight and have a low risk for diabetes, have this test once every three years after 71 years of age.  If you are overweight and have a high risk for diabetes, consider being tested at a younger age or more often. Preventing infection Hepatitis B  If you have a higher risk for hepatitis B, you should be screened for this virus. You are considered at high risk for hepatitis B if:  You were born in a country where hepatitis B is common. Ask your health care provider which countries are considered high risk.  Your parents were born in a high-risk country, and you have not been immunized against hepatitis B (hepatitis B vaccine).  You have HIV or AIDS.  You use needles to inject street drugs.  You live with someone who has hepatitis B.  You have had sex with someone who has hepatitis B.  You get hemodialysis treatment.  You take certain medicines for conditions, including cancer, organ transplantation, and autoimmune conditions. Hepatitis C  Blood testing is recommended for:  Everyone born from [birth year] through 1965.  Anyone with known risk factors for hepatitis C. Sexually transmitted infections (STIs)  You should be screened for sexually transmitted infections (STIs) including gonorrhea and chlamydia if:  You are sexually active and are younger than 71 years of age.  You are older than 71 years of age and your health care provider tells you that you are at risk for this type of infection.  Your sexual activity has changed since you were last screened and you are at an increased risk for chlamydia or gonorrhea. Ask  your health care provider if you are at risk.  If you do not have HIV, but are at risk, it may be recommended that you take a prescription medicine daily to prevent HIV infection. This is called pre-exposure prophylaxis (PrEP). You are considered at risk if:  You are sexually active and do not regularly use condoms or know the HIV status of your partner(s).  You take drugs by injection.  You are sexually active with a partner who has HIV. Talk with your health care provider  about whether you are at high risk of being infected with HIV. If you choose to begin PrEP, you should first be tested for HIV. You should then be tested every 3 months for as long as you are taking PrEP. Pregnancy  If you are premenopausal and you may become pregnant, ask your health care provider about preconception counseling.  If you may become pregnant, take 400 to 800 micrograms (mcg) of folic acid every day.  If you want to prevent pregnancy, talk to your health care provider about birth control (contraception). Osteoporosis and menopause  Osteoporosis is a disease in which the bones lose minerals and strength with aging. This can result in serious bone fractures. Your risk for osteoporosis can be identified using a bone density scan.  If you are 9 years of age or older, or if you are at risk for osteoporosis and fractures, ask your health care provider if you should be screened.  Ask your health care provider whether you should take a calcium or vitamin D supplement to lower your risk for osteoporosis.  Menopause may have certain physical symptoms and risks.  Hormone replacement therapy may reduce some of these symptoms and risks. Talk to your health care provider about whether hormone replacement therapy is right for you. Follow these instructions at home:  Schedule regular health, dental, and eye exams.  Stay current with your immunizations.  Do not use any tobacco products including cigarettes,  chewing tobacco, or electronic cigarettes.  If you are pregnant, do not drink alcohol.  If you are breastfeeding, limit how much and how often you drink alcohol.  Limit alcohol intake to no more than 1 drink per day for nonpregnant women. One drink equals 12 ounces of beer, 5 ounces of wine, or 1 ounces of hard liquor.  Do not use street drugs.  Do not share needles.  Ask your health care provider for help if you need support or information about quitting drugs.  Tell your health care provider if you often feel depressed.  Tell your health care provider if you have ever been abused or do not feel safe at home. This information is not intended to replace advice given to you by your health care provider. Make sure you discuss any questions you have with your health care provider. Document Released: 10/09/2010 Document Revised: 09/01/2015 Document Reviewed: 12/28/2014 Elsevier Interactive Patient Education  2017 [employer].

## 2016-08-17 ENCOUNTER — Encounter: Payer: Self-pay | Admitting: General Practice

## 2016-08-23 ENCOUNTER — Telehealth: Payer: Self-pay | Admitting: Hematology and Oncology

## 2016-08-23 NOTE — Telephone Encounter (Signed)
lvm to inform pt of May and June appts changes per sch msg

## 2016-08-25 ENCOUNTER — Telehealth: Payer: Self-pay

## 2016-08-25 NOTE — Telephone Encounter (Signed)
Called and left a message with new  6/15  hercep. time per inbox message  Kelita Wallis

## 2016-08-28 ENCOUNTER — Telehealth: Payer: Self-pay

## 2016-08-28 DIAGNOSIS — S239XXS Sprain of unspecified parts of thorax, sequela: Secondary | ICD-10-CM | POA: Diagnosis not present

## 2016-08-28 DIAGNOSIS — M542 Cervicalgia: Secondary | ICD-10-CM | POA: Diagnosis not present

## 2016-08-28 DIAGNOSIS — S134XXD Sprain of ligaments of cervical spine, subsequent encounter: Secondary | ICD-10-CM | POA: Diagnosis not present

## 2016-08-28 DIAGNOSIS — M546 Pain in thoracic spine: Secondary | ICD-10-CM | POA: Diagnosis not present

## 2016-08-28 NOTE — Telephone Encounter (Signed)
Returned pt vm regarding needing an okay for Dr.Ramos (ortho dr.) to do injections for pt back pain. Dr.Gudena states that she is okay to proceed. Pt will let Dr.Ramos know and to call if she has any other questions. Pt verbalized understanding and appreciates the time of call.

## 2016-08-30 ENCOUNTER — Encounter: Payer: Self-pay | Admitting: Family Medicine

## 2016-08-30 LAB — HM DIABETES EYE EXAM

## 2016-08-31 ENCOUNTER — Other Ambulatory Visit: Payer: Medicare Other

## 2016-08-31 ENCOUNTER — Encounter: Payer: Self-pay | Admitting: Hematology and Oncology

## 2016-08-31 ENCOUNTER — Ambulatory Visit (HOSPITAL_BASED_OUTPATIENT_CLINIC_OR_DEPARTMENT_OTHER): Payer: Medicare Other

## 2016-08-31 ENCOUNTER — Other Ambulatory Visit (HOSPITAL_BASED_OUTPATIENT_CLINIC_OR_DEPARTMENT_OTHER): Payer: Medicare Other

## 2016-08-31 ENCOUNTER — Ambulatory Visit: Payer: Medicare Other | Admitting: Hematology and Oncology

## 2016-08-31 ENCOUNTER — Telehealth: Payer: Self-pay | Admitting: Hematology and Oncology

## 2016-08-31 ENCOUNTER — Ambulatory Visit (HOSPITAL_BASED_OUTPATIENT_CLINIC_OR_DEPARTMENT_OTHER): Payer: Medicare Other | Admitting: Hematology and Oncology

## 2016-08-31 DIAGNOSIS — C50511 Malignant neoplasm of lower-outer quadrant of right female breast: Secondary | ICD-10-CM

## 2016-08-31 DIAGNOSIS — Z5112 Encounter for antineoplastic immunotherapy: Secondary | ICD-10-CM | POA: Diagnosis not present

## 2016-08-31 DIAGNOSIS — Z79811 Long term (current) use of aromatase inhibitors: Secondary | ICD-10-CM | POA: Diagnosis not present

## 2016-08-31 DIAGNOSIS — Z170 Estrogen receptor positive status [ER+]: Secondary | ICD-10-CM

## 2016-08-31 LAB — COMPREHENSIVE METABOLIC PANEL
ALT: 16 U/L (ref 0–55)
AST: 18 U/L (ref 5–34)
Albumin: 3.8 g/dL (ref 3.5–5.0)
Alkaline Phosphatase: 104 U/L (ref 40–150)
Anion Gap: 10 mEq/L (ref 3–11)
BUN: 16.5 mg/dL (ref 7.0–26.0)
CALCIUM: 9.5 mg/dL (ref 8.4–10.4)
CHLORIDE: 107 meq/L (ref 98–109)
CO2: 26 meq/L (ref 22–29)
CREATININE: 1 mg/dL (ref 0.6–1.1)
EGFR: 67 mL/min/{1.73_m2} — AB (ref >90)
Glucose: 90 mg/dl (ref 70–140)
POTASSIUM: 3.6 meq/L (ref 3.5–5.1)
Sodium: 143 mEq/L (ref 136–145)
Total Bilirubin: 0.47 mg/dL (ref 0.20–1.20)
Total Protein: 6.7 g/dL (ref 6.4–8.3)

## 2016-08-31 LAB — CBC WITH DIFFERENTIAL/PLATELET
BASO%: 0.8 % (ref 0.0–2.0)
BASOS ABS: 0.1 10*3/uL (ref 0.0–0.1)
EOS%: 3.7 % (ref 0.0–7.0)
Eosinophils Absolute: 0.3 10*3/uL (ref 0.0–0.5)
HEMATOCRIT: 38.7 % (ref 34.8–46.6)
HGB: 13.2 g/dL (ref 11.6–15.9)
LYMPH#: 2.9 10*3/uL (ref 0.9–3.3)
LYMPH%: 39.6 % (ref 14.0–49.7)
MCH: 31.8 pg (ref 25.1–34.0)
MCHC: 34.1 g/dL (ref 31.5–36.0)
MCV: 93.2 fL (ref 79.5–101.0)
MONO#: 0.5 10*3/uL (ref 0.1–0.9)
MONO%: 7 % (ref 0.0–14.0)
NEUT#: 3.6 10*3/uL (ref 1.5–6.5)
NEUT%: 48.9 % (ref 38.4–76.8)
PLATELETS: 247 10*3/uL (ref 145–400)
RBC: 4.15 10*6/uL (ref 3.70–5.45)
RDW: 14.7 % — ABNORMAL HIGH (ref 11.2–14.5)
WBC: 7.4 10*3/uL (ref 3.9–10.3)

## 2016-08-31 MED ORDER — SODIUM CHLORIDE 0.9 % IV SOLN
Freq: Once | INTRAVENOUS | Status: AC
  Administered 2016-08-31: 10:00:00 via INTRAVENOUS
  Filled 2016-08-31: qty 250

## 2016-08-31 MED ORDER — HEPARIN SOD (PORK) LOCK FLUSH 100 UNIT/ML IV SOLN
500.0000 [IU] | Freq: Once | INTRAVENOUS | Status: AC | PRN
  Administered 2016-08-31: 500 [IU]
  Filled 2016-08-31: qty 5

## 2016-08-31 MED ORDER — DIPHENHYDRAMINE HCL 25 MG PO CAPS
ORAL_CAPSULE | ORAL | Status: AC
  Filled 2016-08-31: qty 2

## 2016-08-31 MED ORDER — ACETAMINOPHEN 325 MG PO TABS
650.0000 mg | ORAL_TABLET | Freq: Once | ORAL | Status: AC
  Administered 2016-08-31: 650 mg via ORAL

## 2016-08-31 MED ORDER — DIPHENHYDRAMINE HCL 25 MG PO CAPS
50.0000 mg | ORAL_CAPSULE | Freq: Once | ORAL | Status: AC
  Administered 2016-08-31: 50 mg via ORAL

## 2016-08-31 MED ORDER — ACETAMINOPHEN 325 MG PO TABS
ORAL_TABLET | ORAL | Status: AC
  Filled 2016-08-31: qty 2

## 2016-08-31 MED ORDER — SODIUM CHLORIDE 0.9 % IV SOLN
Freq: Once | INTRAVENOUS | Status: AC
  Administered 2016-08-31: 10:00:00 via INTRAVENOUS

## 2016-08-31 MED ORDER — SODIUM CHLORIDE 0.9% FLUSH
10.0000 mL | INTRAVENOUS | Status: DC | PRN
  Administered 2016-08-31: 10 mL
  Filled 2016-08-31: qty 10

## 2016-08-31 MED ORDER — ATORVASTATIN CALCIUM 20 MG PO TABS: 20.0000 mg | ORAL_TABLET | Freq: Every evening | ORAL | 1 refills | Status: DC

## 2016-08-31 MED ORDER — TRASTUZUMAB CHEMO 150 MG IV SOLR
6.0000 mg/kg | Freq: Once | INTRAVENOUS | Status: AC
  Administered 2016-08-31: 483 mg via INTRAVENOUS
  Filled 2016-08-31: qty 23

## 2016-08-31 NOTE — Telephone Encounter (Signed)
Gave patient avs report and appointments for June and August.

## 2016-08-31 NOTE — Assessment & Plan Note (Signed)
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 Adjuvant chemotherapy with [place] 2. Followed by adjuvant antiestrogen therapy with anastrozole 5 years No role of radiation since she had mastectomy. ----------------------------------------------------------------------------------------------------------------------- Prior treatment: Completed 5 cycles ofTCH, chemotherapy discontinued for neuropathy. Patient is onHerceptin maintenance Echocardiogram was reviewed 08/25/2015: EF 65-70%  Acute Diverticulitis: ED visit 02/22/16 Shingles: Significant improvement in trying out  Plan: 1. Continue herceptin q 3 weeks 2. Anastrozole 1 mg daily startedin 03/15/16 discontinued 04/25/2016 because of pain in hands and feet, switched her to tamoxifen therapy. But she stopped taking tamoxifen as well. 3. Started letrozole 07/31/2016  CT abdomen and pelvis 07/30/2016: Normal study  Letrozole toxicities:  Return to clinic in 6 months for follow-up

## 2016-08-31 NOTE — Patient Instructions (Signed)
[facility] Discharge Instructions for Patients Receiving Chemotherapy  Today you received the following chemotherapy agents: Herceptin   If you develop nausea and vomiting that is not controlled by your nausea medication, call the clinic.   BELOW ARE SYMPTOMS THAT SHOULD BE REPORTED IMMEDIATELY:  *FEVER GREATER THAN 100.5 F  *CHILLS WITH OR WITHOUT FEVER  NAUSEA AND VOMITING THAT IS NOT CONTROLLED WITH YOUR NAUSEA MEDICATION  *UNUSUAL SHORTNESS OF BREATH  *UNUSUAL BRUISING OR BLEEDING  TENDERNESS IN MOUTH AND THROAT WITH OR WITHOUT PRESENCE OF ULCERS  *URINARY PROBLEMS  *BOWEL PROBLEMS  UNUSUAL RASH Items with * indicate a potential emergency and should be followed up as soon as possible.  Feel free to call the clinic should you have any questions or concerns. The clinic phone number is [phone].  Please show the CHEMO ALERT CARD at check-in to the Emergency Department and triage nurse.   

## 2016-08-31 NOTE — Progress Notes (Signed)
Patient Care Team: Midge Minium, MD as PCP - General [name], MD as Consulting Physician (Cardiology) Marylynn Pearson, MD as Consulting Physician (Ophthalmology) Nicholas Lose, MD as Consulting Physician (Hematology and Oncology) Kyung Rudd, MD as Consulting Physician (Radiation Oncology) Fanny Skates, MD as Consulting Physician (General Surgery) [place], [place] (Specialist) Suella Broad, MD as Consulting Physician (Physical Medicine and Rehabilitation)  DIAGNOSIS:  Encounter Diagnosis  Name Primary?  . 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 She has one more Herceptin treatment left. We switched her to letrozole and she is tolerating it extremely well. She had walked four a half miles at [facility] [place].  REVIEW OF  SYSTEMS:   Constitutional: Denies fevers, chills or abnormal weight loss Eyes: Denies blurriness of vision Ears, nose, mouth, throat, and face: Denies mucositis or sore throat Respiratory: Denies cough, dyspnea or wheezes Cardiovascular: Denies palpitation, chest discomfort Gastrointestinal:  Denies nausea, heartburn or change in bowel habits Skin: Denies abnormal skin rashes Lymphatics: Denies new lymphadenopathy or easy bruising Neurological:Denies numbness, tingling or new weaknesses Behavioral/Psych: Mood is stable, no new changes  Extremities: No lower extremity edema Breast:  denies any pain or lumps or nodules in either breasts All other systems were reviewed with the patient and are negative.  I have reviewed the past medical history, past surgical history, social history and family history with the patient and they are unchanged from previous note.  ALLERGIES:  is allergic to contrast media [iodinated diagnostic agents]; dilaudid [hydromorphone hcl]; adhesive [tape]; codeine; and lactose intolerance (gi).  MEDICATIONS:  Current Outpatient Prescriptions  Medication Sig Dispense Refill  . ALPRAZolam (XANAX) 1 MG tablet TAKE 1 TABLET BY MOUTH TWICE A DAY AS NEEDED FOR ANXIETY 60 tablet 1  . amLODipine (NORVASC) 2.5 MG tablet Take 2.5 mg by mouth daily.  3  . aspirin 81 MG tablet Take 81 mg by mouth daily.    Marland Kitchen atorvastatin (LIPITOR) 20 MG tablet Take 1 tablet (20 mg total) by mouth every evening. 90 tablet 1  . buPROPion (WELLBUTRIN XL) 300 MG 24 hr tablet TAKE 1 TABLET BY MOUTH EVERY DAY 30 tablet 3  . diphenhydrAMINE (BENADRYL) 50 MG tablet Take one tablet (71ms total) one hour prior to study. 1 tablet 0  . furosemide (LASIX) 20 MG tablet Take 1 tablet (  20 mg total) by mouth daily. (Patient not taking: Reported on 08/16/2016) 30 tablet 1  . HYDROcodone-acetaminophen (NORCO/VICODIN) 5-325 MG tablet Take 1 tablet by mouth 3 (three) times daily as needed.  0  . letrozole (FEMARA)  2.5 MG tablet Take 1 tablet (2.5 mg total) by mouth daily. 90 tablet 3  . levothyroxine (SYNTHROID, LEVOTHROID) 75 MCG tablet TAKE 1 TABLET (75 MCG TOTAL) BY MOUTH DAILY. 30 tablet 6  . lidocaine-prilocaine (EMLA) cream Apply to affected area once 30 g 3  . magic mouthwash w/lidocaine SOLN 36m Swish,Swallow, or spit 4 times a day as needed 240 mL 1  . meloxicam (MOBIC) 15 MG tablet Take 15 mg by mouth daily.     . nitroGLYCERIN (NITROSTAT) 0.4 MG SL tablet Place 1 tablet (0.4 mg total) under the tongue every 5 (five) minutes as needed. As needed for chest pain 30 tablet 3  . ondansetron (ZOFRAN ODT) 4 MG disintegrating tablet Take 1 tablet (4 mg total) by mouth every 8 (eight) hours as needed for nausea or vomiting. 20 tablet 0  . Polyethyl Glycol-Propyl Glycol (SYSTANE ULTRA) 0.4-0.3 % SOLN Place 1 drop into both eyes 3 (three) times daily as needed (for dry eyes).     . predniSONE (DELTASONE) 50 MG tablet Take one tablet (564m) total 13 hours, 7 hours and 1 hour prior to CT study 3 tablet 0  . PROAIR HFA 108 (90 Base) MCG/ACT inhaler INHALE 2 PUFFS INTO THE LUNGS EVERY 6 (SIX) HOURS AS NEEDED FOR WHEEZING. 8.5 Inhaler 0  . prochlorperazine (COMPAZINE) 10 MG tablet Take 1 tablet (10 mg total) by mouth every 6 (six) hours as needed (Nausea or vomiting). 90 tablet 0  . ranitidine (ZANTAC) 150 MG tablet Take 150 mg by mouth 2 (two) times daily as needed for heartburn. Reported on 10/05/2015    . tiZANidine (ZANAFLEX) 4 MG tablet TAKE 1 TABLET BY MOUTH EVERY 8 HOURS AS NEEDED FOR SPASM  0  . traMADol (ULTRAM) 50 MG tablet Take 50 mg by mouth daily as needed for moderate pain. Reported on 10/05/2015  0   No current facility-administered medications for this visit.    Facility-Administered Medications Ordered in Other Visits  Medication Dose Route Frequency Provider Last Rate Last Dose  . sodium chloride flush (NS) 0.9 % injection 10 mL  10 mL Intracatheter PRN GuNicholas LoseMD   10 mL at 12/15/15 1439     PHYSICAL EXAMINATION: ECOG PERFORMANCE STATUS: 0 - Asymptomatic  Vitals:   08/31/16 0853  BP: 140/83  Pulse: 81  Resp: 18  Temp: 98 F (36.7 C)   Filed Weights   08/31/16 0853  Weight: 187 lb 4.8 oz (85 kg)    GENERAL:alert, no distress and comfortable SKIN: skin color, texture, turgor are normal, no rashes or significant lesions EYES: normal, Conjunctiva are pink and non-injected, sclera clear OROPHARYNX:no exudate, no erythema and lips, buccal mucosa, and tongue normal  NECK: supple, thyroid normal size, non-tender, without nodularity LYMPH:  no palpable lymphadenopathy in the cervical, axillary or inguinal LUNGS: clear to auscultation and percussion with normal breathing effort HEART: regular rate & rhythm and no murmurs and no lower extremity edema ABDOMEN:abdomen soft, non-tender and normal bowel sounds MUSCULOSKELETAL:no cyanosis of digits and no clubbing  NEURO: alert & oriented x 3 with fluent speech, no focal motor/sensory deficits EXTREMITIES: No lower extremity edema  LABORATORY DATA:  I have reviewed the data as listed   Chemistry      Component Value  Date/Time   NA 145 08/16/2016 1034   NA 145 07/20/2016 0842   K 4.0 08/16/2016 1034   K 4.3 07/20/2016 0842   CL 105 08/16/2016 1034   CO2 34 (H) 08/16/2016 1034   CO2 28 07/20/2016 0842   BUN 18 08/16/2016 1034   BUN 15.1 07/20/2016 0842   CREATININE 0.99 08/16/2016 1034   CREATININE 1.0 07/20/2016 0842      Component Value Date/Time   CALCIUM 9.5 08/16/2016 1034   CALCIUM 9.4 07/20/2016 0842   ALKPHOS 87 08/16/2016 1034   ALKPHOS 97 07/20/2016 0842   AST 15 08/16/2016 1034   AST 13 07/20/2016 0842   ALT 15 08/16/2016 1034   ALT 13 07/20/2016 0842   BILITOT 0.6 08/16/2016 1034   BILITOT 0.33 07/20/2016 0842       Lab Results  Component Value Date   WBC 7.4 08/31/2016   HGB 13.2 08/31/2016   HCT 38.7 08/31/2016   MCV 93.2 08/31/2016   PLT 247 08/31/2016   NEUTROABS 3.6 08/31/2016     ASSESSMENT & PLAN:  Breast cancer of lower-outer quadrant of right female breast ([place]) Right mastectomy 08/15/2015: IDC grade 2, 2.2 cm, with associated DCIS intermediate grade, separate focus high-grade DCIS, 0/4 lymph nodes negative, T2 N0 stage II a, ER 90%, PR 5%, HER-2 negative duration 1.42, Ki-67 60%  Treatment plan: 1. Adjuvant chemotherapy with [place] 2. Followed by adjuvant antiestrogen therapy with anastrozole 5 years No role of radiation since she had mastectomy. ----------------------------------------------------------------------------------------------------------------------- Prior treatment: Completed 5 cycles ofTCH, chemotherapy discontinued for neuropathy. Patient is onHerceptin maintenance Echocardiogram was reviewed 08/25/2015: EF 65-70%  Acute Diverticulitis: ED visit 02/22/16 Shingles: Significant improvement in trying out  Plan: 1. Continue herceptin q 3 weeks 2. Anastrozole 1 mg daily startedin 03/15/16 discontinued 04/25/2016 because of pain in hands and feet, switched her to tamoxifen therapy. But she stopped taking tamoxifen as well. 3. Started letrozole 07/31/2016  CT abdomen and pelvis 07/30/2016: Normal study  Letrozole toxicities: Denies any hot flashes or myalgias. Tolerating it extremely well.  Return to clinic in 3 months for follow-up   I spent 25 minutes talking to the patient of which more than half was spent in counseling and coordination of care.  No orders of the defined types were placed in this encounter.  The patient has a good understanding of the overall plan. she agrees with it. she will call with any problems that may develop before the next visit here.   Rulon Eisenmenger, MD 08/31/16

## 2016-09-06 ENCOUNTER — Ambulatory Visit: Payer: Medicare Other | Attending: Hematology and Oncology | Admitting: Physical Therapy

## 2016-09-06 DIAGNOSIS — R222 Localized swelling, mass and lump, trunk: Secondary | ICD-10-CM | POA: Diagnosis not present

## 2016-09-06 NOTE — Therapy (Signed)
[place], [place], [address] Phone: [phone]   Fax:  [phone]  Physical Therapy Evaluation  Patient Details  Name: Tracey Evans MRN: [id number] Date of Birth: 1946-02-07 Referring Provider: Dr. Nicholas Lose  Encounter Date: 09/06/2016      PT End of Session - 09/06/16 1737    Visit Number 1   Number of Visits 8   Date for PT Re-Evaluation 10/12/16   PT Start Time 1347   PT Stop Time 1430   PT Time Calculation (min) 43 min   Activity Tolerance Patient tolerated treatment well   Behavior During Therapy [facility] for tasks assessed/performed      Past Medical History:  Diagnosis Date  . Angina at rest [facility])    "occurs whenever it wants to, but worse during agitation"  . Anxiety   . Asthma   . Binge eating disorder   . Breast cancer of lower-outer quadrant of left female breast ([place]) 07/08/2015   "NEVER HAD LEFT BREAST CANCER" (08/15/2015)  . Cancer of right breast ([place]) 08/15/2015  . Chronic bronchitis ([place])   . Chronic lower back pain   . Complication of anesthesia    had bronc spasms during intubation surgery 2009 on foot-need albuterol inhaler or neb tx preop  . Costochondritis   . Depression   . Diverticular disease   . Fibromyalgia   . GERD (gastroesophageal reflux disease)   . Hot flashes   . Hyperlipidemia   . Hypertension   . Hypothyroidism   . Osteoarthritis    "all over"  . Peptic ulcer disease   . Personal history of chemotherapy   . Shortness of breath dyspnea   . Type II diabetes mellitus ([place])    "diet controlled" (08/15/2015)    Past Surgical History:  Procedure Laterality Date  . ABDOMINAL HYSTERECTOMY     "left my ovaries"  . ANKLE ARTHROSCOPY  01/02/2012   Procedure: ANKLE ARTHROSCOPY;  Surgeon: Colin Rhein, MD;  Location: [facility];  Service: Orthopedics;  Laterality: Right;  right ankle arthroscopy with extensive debridement , dridement and  drilling talar dome osteochondral lesion  . ANKLE FRACTURE SURGERY Right 2009   Dr. Rolena Infante  . BREAST BIOPSY  07/2015  . CARDIAC CATHETERIZATION    . CARPAL TUNNEL RELEASE Bilateral   . COLONOSCOPY    . DILATION AND CURETTAGE OF UTERUS     "before hysterectomy"  . FRACTURE SURGERY    . KNEE ARTHROSCOPY Bilateral   . LAPAROSCOPIC CHOLECYSTECTOMY    . MASTECTOMY COMPLETE / SIMPLE W/ SENTINEL NODE BIOPSY Right 08/15/2015  . MASTECTOMY W/ SENTINEL NODE BIOPSY Right 08/15/2015   Procedure: TOTAL MASTECTOMY WITH SENTINEL LYMPH NODE BIOPSY AND BLUE DYE INJECTION ;  Surgeon: Fanny Skates, MD;  Location: [facility];  Service: General;  Laterality: Right;  . PORTACATH PLACEMENT Left 08/15/2015  . PORTACATH PLACEMENT Left 08/15/2015   Procedure: INSERTION PORT-A-CATH ;  Surgeon: Fanny Skates, MD;  Location: [facility];  Service: General;  Laterality: Left;  . SHOULDER ARTHROSCOPY Bilateral 2009-2011   "bone spurs"  . TONSILLECTOMY      There were no vitals filed for this visit.       Subjective Assessment - 09/06/16 1348    Subjective "I have this huge bulge here (right abdomen) and nobody seems to know what it is.  I'm depressed about it and I don't want to go out."  Wears an elastic tank top that helps some.  She noticed the right abdominal swelling start to develop after her drains were removed.  Some days it's bigger than others, and she doesn't know why. Hand and foot neuropathy.   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  Took Arimidex until day of surgery on May 8th, 2017 and portacath was placed at left chest along with right mastectomy.  Fibromyalgia diagnosed 20 years ago and says she manages it.  Has osteooarthitis "from head to feet."  Costochondritis in her right ribs x 3 years and has had nerve  ablation for that.  Has had arthroscopic debridement of both shoulders 2010.  Both knee arthroscopic surgery in the past; fracture of right ankle about 2011 with ORIF, subsequently removed.  HTN controlled with meds.  Angina and usually remembers her nitroglycerin, but says it is controlled.  Will have injections 09/07/16 in her neck for the constochondritis. Abdominal swelling started when drains were removed; CT scan was done last month but it was negative.  She says that both total knees and total hips have been recommended to her.   Patient Stated Goals get rid of the abdominal bulge   Currently in Pain? Yes   Pain Score 9    Pain Location Neck  down her back   Pain Orientation Right   Pain Descriptors / Indicators Tightness   Aggravating Factors  turning head to the right   Pain Relieving Factors nothing   Multiple Pain Sites Yes   Pain Score 10   Pain Location Generalized  all over   Pain Descriptors / Indicators Sore   Aggravating Factors  rubbing on skin   Pain Relieving Factors not touching it            [facility] PT Assessment - 09/06/16 0001      Assessment   Medical Diagnosis right breast cancer; right abdominal swelling   Referring Provider Dr. Nicholas Lose   Onset Date/Surgical Date 08/15/15   Prior Therapy none     Precautions   Precautions Other (comment)   Precaution Comments cancer precautions     Restrictions   Weight Bearing Restrictions No     Balance Screen   Has the patient fallen in the past 6 months No   Has the patient had a decrease in activity level because of a fear of falling?  No   Is the patient reluctant to leave their home because of a fear of falling?  No     Home Environment   Living Environment Private residence   Living Arrangements Non-relatives/Friends  but he is to move out by 09/21/16   Type of Home Apartment     Observation/Other Assessments   Observations right abdomen appears a bit bigger than left, more obvious with clothes  pulled over it than without     ROM / Strength   AROM / PROM / Strength AROM     AROM   Overall AROM Comments standing trunk AROM:  extension 25% limited, rotation 50% limited bilat., both by back pain issues; others not tested     Palpation   Palpation comment right abdomen is softer, more malleable than left to palpation           LYMPHEDEMA/ONCOLOGY QUESTIONNAIRE - 09/06/16 1406      Type   Cancer Type  right breast     Surgeries   Mastectomy Date 08/15/15   Number Lymph Nodes Removed 4  all negative     Treatment   Active Chemotherapy Treatment Yes   Date --  herceptin, finishes 09/28/16   Past Chemotherapy Treatment Yes   Date --  5 months ago   Past Radiation Treatment No   Current Hormone Treatment Yes   Drug Name letrozole     Lymphedema Assessments   Lymphedema Assessments Upper extremities     Right Upper Extremity Lymphedema   Other 6 cm. superior to top of umbilicus, 742.5   Other 3 cm. superior to top of umbilicus 956.3   Other at mid-umbilicus 875.6     Left Upper Extremity Lymphedema   Other at 4 cm. inferior to bottom of umbilicus 433         Objective measurements completed on examination: See above findings.                          [facility] Goals - 09/06/16 2036      CC Long Term Goal  #1   Title Swelling at 3 cm. superior to top of umbilicus will reduce at least 2 cm. to 107.3 cm. or less   Time 2   Period Weeks   Status New     CC Long Term Goal  #2   Title Pt. will be knowledgeable about proper compression garment for controlling abdominal swelling.   Time 2   Period Weeks   Status New     CC Long Term Goal  #3   Title Pt. will be knowledgeable about self-care for managing abdominal swelling.   Time 2   Period Weeks   Status New     CC Long Term Goal  #4   Title Lymphedema life impact scale score impairment reduced to <40%   Baseline 46% at eval   Time 2   Period Weeks   Status New              Plan - 09/06/16 1738    Clinical Impression Statement Patient is known to this clinic from prior treatment here.  She returns now with c/o right abdominal swelling from unknown cause. She has had a CT scan and her doctors report that it was negative.  She is referred for lymphedema treatment. After assessment we discussed treatment options, and patient was told we could not guarantee success, but that we do have some treatments to try.   History and Personal Factors relevant to plan of care: fibromyalgia, arthritis and pain problems   Clinical Presentation Evolving   Clinical Presentation due to: onset of abdominal swelling from unknown cause   Clinical Decision Making Moderate   Rehab Potential Good   PT Frequency 2x / week   PT Duration 2 weeks  to 4 weeks prn   PT Treatment/Interventions ADLs/Self Care Home Management;DME Instruction;Therapeutic exercise;Patient/family education;Manual techniques;Manual lymph drainage;Compression bandaging;Taping   PT Next Visit Plan Begin manual lymph drainage for right abdomen; consider using compression bandage; check patient's current compression garment and belt; assist with obtaining appropriate compression garments; educate about pump if appropriate.   Consulted and Agree with Plan of Care Patient      Patient will benefit from skilled therapeutic intervention in order to improve the following deficits and impairments:  Increased edema, Decreased knowledge of use of DME, Decreased knowledge of precautions  Visit Diagnosis: Localized swelling, mass and  lump, trunk - Plan: PT plan of care cert/re-cert      G-Codes - 82/95/62 2038    Functional Assessment Tool Used (Outpatient Only) lymphedema life impact scale   Functional Limitation Self care   Self Care Current Status (Z3086) At least 40 percent but less than 60 percent impaired, limited or restricted   Self Care Goal Status (V7846) At least 20 percent but less than 40 percent  impaired, limited or restricted       Problem List Patient Active Problem List   Diagnosis Date Noted  . Shingles 02/14/2016  . Anemia associated with nutritional deficiency 01/13/2016  . Infection of nail bed of finger 12/22/2015  . Syncope 10/05/2015  . Genetic testing 08/21/2015  . Family history of breast cancer in female 07/15/2015  . Family history of colon cancer 07/15/2015  . Breast cancer of lower-outer quadrant of right female breast ([place]) 07/08/2015  . Disturbance in sleep behavior 11/18/2014  . Fibromyalgia 10/04/2014  . Flu-like symptoms 07/09/2014  . Diverticulitis of colon (without mention of hemorrhage)(562.11) 12/01/2013  . UTI (urinary tract infection) 12/01/2013  . Abdominal pain, unspecified site 12/01/2013  . Dry mouth 04/29/2013  . Dry eye 04/29/2013  . Dry skin 04/29/2013  . Obesity (BMI 30-39.9) 03/11/2013  . Grief 10/17/2012  . Edema 09/04/2012  . Depression 09/04/2012  . Chemotherapy-induced peripheral neuropathy ([place]) 05/12/2012  . OAB (overactive bladder) 04/01/2012  . DM w/o complication type II ([place]) 02/06/2012  . Colitis 01/21/2012  . Allergy to dogs 04/19/2011  . Polyarthralgia 09/18/2010  . STRESS INCONTINENCE 04/25/2010  . ASTHMA 04/11/2010  . VERTIGO 04/11/2010  . Hypothyroidism 01/19/2010  . Hyperlipidemia associated with type 2 diabetes mellitus ([place]) 01/19/2010  . Essential hypertension 01/19/2010  . GERD 01/19/2010  . PEPTIC ULCER DISEASE 01/19/2010  . OSTEOARTHRITIS 01/19/2010  . LOW BACK PAIN 01/19/2010    SALISBURY,DONNA 09/06/2016, 8:43 PM  [place], [place], [address] Phone: [phone]   Fax:  [phone]  Name: Tracey Evans MRN: [id number] Date of Birth: Jun 30, 1945   Serafina Royals, PT 09/06/16 8:43 PM

## 2016-09-07 DIAGNOSIS — M5412 Radiculopathy, cervical region: Secondary | ICD-10-CM | POA: Diagnosis not present

## 2016-09-07 DIAGNOSIS — M5010 Cervical disc disorder with radiculopathy, unspecified cervical region: Secondary | ICD-10-CM | POA: Diagnosis not present

## 2016-09-07 DIAGNOSIS — M5033 Other cervical disc degeneration, cervicothoracic region: Secondary | ICD-10-CM | POA: Diagnosis not present

## 2016-09-07 DIAGNOSIS — M542 Cervicalgia: Secondary | ICD-10-CM | POA: Diagnosis not present

## 2016-09-09 ENCOUNTER — Other Ambulatory Visit: Payer: Self-pay | Admitting: Family Medicine

## 2016-09-10 DIAGNOSIS — I2510 Atherosclerotic heart disease of native coronary artery without angina pectoris: Secondary | ICD-10-CM | POA: Diagnosis not present

## 2016-09-10 DIAGNOSIS — E039 Hypothyroidism, unspecified: Secondary | ICD-10-CM | POA: Diagnosis not present

## 2016-09-10 DIAGNOSIS — E669 Obesity, unspecified: Secondary | ICD-10-CM | POA: Diagnosis not present

## 2016-09-10 DIAGNOSIS — I10 Essential (primary) hypertension: Secondary | ICD-10-CM | POA: Diagnosis not present

## 2016-09-10 DIAGNOSIS — E785 Hyperlipidemia, unspecified: Secondary | ICD-10-CM | POA: Diagnosis not present

## 2016-09-10 DIAGNOSIS — M797 Fibromyalgia: Secondary | ICD-10-CM | POA: Diagnosis not present

## 2016-09-10 NOTE — Telephone Encounter (Signed)
Last OV 08/16/16 Alprazolam last filled 06/25/16 #60 with 1

## 2016-09-10 NOTE — Telephone Encounter (Signed)
Medication filled to pharmacy as requested.   

## 2016-09-14 DIAGNOSIS — I10 Essential (primary) hypertension: Secondary | ICD-10-CM | POA: Diagnosis not present

## 2016-09-14 DIAGNOSIS — Z803 Family history of malignant neoplasm of breast: Secondary | ICD-10-CM | POA: Diagnosis not present

## 2016-09-14 DIAGNOSIS — Z90710 Acquired absence of both cervix and uterus: Secondary | ICD-10-CM | POA: Diagnosis not present

## 2016-09-14 DIAGNOSIS — M797 Fibromyalgia: Secondary | ICD-10-CM | POA: Diagnosis not present

## 2016-09-14 DIAGNOSIS — E785 Hyperlipidemia, unspecified: Secondary | ICD-10-CM | POA: Diagnosis not present

## 2016-09-14 DIAGNOSIS — C50111 Malignant neoplasm of central portion of right female breast: Secondary | ICD-10-CM | POA: Diagnosis not present

## 2016-09-14 DIAGNOSIS — K219 Gastro-esophageal reflux disease without esophagitis: Secondary | ICD-10-CM | POA: Diagnosis not present

## 2016-09-14 DIAGNOSIS — M940 Chondrocostal junction syndrome [Tietze]: Secondary | ICD-10-CM | POA: Diagnosis not present

## 2016-09-18 DIAGNOSIS — M1711 Unilateral primary osteoarthritis, right knee: Secondary | ICD-10-CM | POA: Diagnosis not present

## 2016-09-18 DIAGNOSIS — M25562 Pain in left knee: Secondary | ICD-10-CM | POA: Diagnosis not present

## 2016-09-18 DIAGNOSIS — G8929 Other chronic pain: Secondary | ICD-10-CM | POA: Diagnosis not present

## 2016-09-19 ENCOUNTER — Ambulatory Visit: Payer: Medicare Other | Admitting: Hematology and Oncology

## 2016-09-19 ENCOUNTER — Ambulatory Visit: Payer: Medicare Other

## 2016-09-19 ENCOUNTER — Other Ambulatory Visit: Payer: Medicare Other

## 2016-09-19 ENCOUNTER — Ambulatory Visit: Payer: Medicare Other | Attending: Hematology and Oncology | Admitting: Physical Therapy

## 2016-09-19 DIAGNOSIS — R222 Localized swelling, mass and lump, trunk: Secondary | ICD-10-CM | POA: Diagnosis not present

## 2016-09-19 NOTE — Therapy (Signed)
[place], [place], [address] Phone: [phone]   Fax:  [phone]  Physical Therapy Treatment  Patient Details  Name: Tracey Evans MRN: [id number] Date of Birth: June 23, 1945 Referring Provider: Dr. Nicholas Lose  Encounter Date: 09/19/2016      PT End of Session - 09/19/16 1138    Visit Number 2   Number of Visits 8   Date for PT Re-Evaluation 10/12/16   PT Start Time 0848   PT Stop Time 0938   PT Time Calculation (min) 50 min   Activity Tolerance Patient tolerated treatment well   Behavior During Therapy [facility] for tasks assessed/performed      Past Medical History:  Diagnosis Date  . Angina at rest [facility])    "occurs whenever it wants to, but worse during agitation"  . Anxiety   . Asthma   . Binge eating disorder   . Breast cancer of lower-outer quadrant of left female breast ([place]) 07/08/2015   "NEVER HAD LEFT BREAST CANCER" (08/15/2015)  . Cancer of right breast ([place]) 08/15/2015  . Chronic bronchitis ([place])   . Chronic lower back pain   . Complication of anesthesia    had bronc spasms during intubation surgery 2009 on foot-need albuterol inhaler or neb tx preop  . Costochondritis   . Depression   . Diverticular disease   . Fibromyalgia   . GERD (gastroesophageal reflux disease)   . Hot flashes   . Hyperlipidemia   . Hypertension   . Hypothyroidism   . Osteoarthritis    "all over"  . Peptic ulcer disease   . Personal history of chemotherapy   . Shortness of breath dyspnea   . Type II diabetes mellitus ([place])    "diet controlled" (08/15/2015)    Past Surgical History:  Procedure Laterality Date  . ABDOMINAL HYSTERECTOMY     "left my ovaries"  . ANKLE ARTHROSCOPY  01/02/2012   Procedure: ANKLE ARTHROSCOPY;  Surgeon: Colin Rhein, MD;  Location: [place];  Service: Orthopedics;  Laterality: Right;  right ankle arthroscopy with extensive debridement , dridement and  drilling talar dome osteochondral lesion  . ANKLE FRACTURE SURGERY Right 2009   Dr. Rolena Infante  . BREAST BIOPSY  07/2015  . CARDIAC CATHETERIZATION    . CARPAL TUNNEL RELEASE Bilateral   . COLONOSCOPY    . DILATION AND CURETTAGE OF UTERUS     "before hysterectomy"  . FRACTURE SURGERY    . KNEE ARTHROSCOPY Bilateral   . LAPAROSCOPIC CHOLECYSTECTOMY    . MASTECTOMY COMPLETE / SIMPLE W/ SENTINEL NODE BIOPSY Right 08/15/2015  . MASTECTOMY W/ SENTINEL NODE BIOPSY Right 08/15/2015   Procedure: TOTAL MASTECTOMY WITH SENTINEL LYMPH NODE BIOPSY AND BLUE DYE INJECTION ;  Surgeon: Fanny Skates, MD;  Location: [place];  Service: General;  Laterality: Right;  . PORTACATH PLACEMENT Left 08/15/2015  . PORTACATH PLACEMENT Left 08/15/2015   Procedure: INSERTION PORT-A-CATH ;  Surgeon: Fanny Skates, MD;  Location: [place];  Service: General;  Laterality: Left;  . SHOULDER ARTHROSCOPY Bilateral 2009-2011   "bone spurs"  . TONSILLECTOMY      There were no vitals filed for this visit.      Subjective Assessment - 09/19/16 0850    Subjective "I had to get a cortisone shot in this (right) knee yesterday." Pt. got four new bras and a new prosthesis at [facility] on Monday this week.   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  Took Arimidex until day of surgery on May 8th, 2017 and portacath was placed at left chest along with right mastectomy.  Fibromyalgia diagnosed 20 years ago and says she manages it.  Has osteooarthitis "from head to feet."  Costochondritis in her right ribs x 3 years and has had nerve ablation for that.  Has had arthroscopic debridement of both shoulders 2010.  Both knee arthroscopic surgery in the past; fracture of right ankle about 2011 with ORIF, subsequently removed.  HTN controlled  with meds.  Angina and usually remembers her nitroglycerin, but says it is controlled.  Will have injections 09/07/16 in her neck for the constochondritis. Abdominal swelling started when drains were removed; CT scan was done last month but it was negative.  She says that both total knees and total hips have been recommended to her.   Currently in Pain? Yes   Pain Score 8    Pain Location Knee   Pain Orientation Right   Pain Descriptors / Indicators Dull   Aggravating Factors  touching it, moving it   Pain Relieving Factors keep it still   Multiple Pain Sites Yes   Pain Score 5   Pain Location Back   Pain Orientation Right   Pain Type Chronic pain               LYMPHEDEMA/ONCOLOGY QUESTIONNAIRE - 09/19/16 0906      Right Upper Extremity Lymphedema   Other 6 cm. superior to top of umbilicus, 536.1   Other 3 cm. superior to top of umbilicus 443.1   Other at mid-umbilicus 540.0                  [facility] Adult PT Treatment/Exercise - 09/19/16 0001      Self-Care   Self-Care Other Self-Care Comments   Other Self-Care Comments  Patient is wearing a Cupid compression garment that goes around her trunk below her bra.  She is in an XL and plans to get a medium.  She also brought in an abdominal binder that hooks with velcro.  That fit fine, and we discussed her using that as a night time compression garment.     Manual Therapy   Manual Therapy Manual Lymphatic Drainage (MLD)   Manual Lymphatic Drainage (MLD) In supine, short neck, superficial and deep abdomen, right groin and axillo-inguinal anastomosis, then a bit medial to that, working from right abdomen to groin; left groing and then working from right abdomen toward left groin to drain fluid that direction.  began educating patient about manual lymph drainage                        Long Term Clinic Goals - 09/19/16 1142      CC Long Term Goal  #1   Title Swelling at 3 cm. superior to top of umbilicus  will reduce at least 2 cm. to 107.3 cm. or less   Status On-going     CC Long Term Goal  #2   Title Pt. will be knowledgeable about proper compression garment for controlling abdominal swelling.   Status Partially Met     CC Long Term Goal  #3   Title Pt. will be knowledgeable about self-care for managing abdominal swelling.   Status  On-going     CC Long Term Goal  #4   Title Lymphedema life impact scale score impairment reduced to <40%   Status On-going            Plan - 09/19/16 1139    Clinical Impression Statement Patient's abdominal circumference measurements are increased at two out of three levels measured.  She came in wearing a compression tank top and does plan to get a smaller size of this so that it fits more snugly.  She has an abdominal binder and we agreed she would just wear this for sleep compression.   Rehab Potential Good   PT Frequency 2x / week   PT Duration 2 weeks  to 4 weeks prn   PT Treatment/Interventions ADLs/Self Care Home Management;DME Instruction;Therapeutic exercise;Patient/family education;Manual techniques;Manual lymph drainage;Compression bandaging;Taping   PT Next Visit Plan Teach patient self-manual lymph drainage for abdomen.  Consider compression bandaging, but check fit if patient gets a new compression tank top.   Consulted and Agree with Plan of Care Patient      Patient will benefit from skilled therapeutic intervention in order to improve the following deficits and impairments:  Increased edema, Decreased knowledge of use of DME, Decreased knowledge of precautions  Visit Diagnosis: Localized swelling, mass and lump, trunk     Problem List Patient Active Problem List   Diagnosis Date Noted  . Shingles 02/14/2016  . Anemia associated with nutritional deficiency 01/13/2016  . Infection of nail bed of finger 12/22/2015  . Syncope 10/05/2015  . Genetic testing 08/21/2015  . Family history of breast cancer in female 07/15/2015  .  Family history of colon cancer 07/15/2015  . Breast cancer of lower-outer quadrant of right female breast ([place]) 07/08/2015  . Disturbance in sleep behavior 11/18/2014  . Fibromyalgia 10/04/2014  . Flu-like symptoms 07/09/2014  . Diverticulitis of colon (without mention of hemorrhage)(562.11) 12/01/2013  . UTI (urinary tract infection) 12/01/2013  . Abdominal pain, unspecified site 12/01/2013  . Dry mouth 04/29/2013  . Dry eye 04/29/2013  . Dry skin 04/29/2013  . Obesity (BMI 30-39.9) 03/11/2013  . Grief 10/17/2012  . Edema 09/04/2012  . Depression 09/04/2012  . Chemotherapy-induced peripheral neuropathy ([place]) 05/12/2012  . OAB (overactive bladder) 04/01/2012  . DM w/o complication type II ([place]) 02/06/2012  . Colitis 01/21/2012  . Allergy to dogs 04/19/2011  . Polyarthralgia 09/18/2010  . STRESS INCONTINENCE 04/25/2010  . ASTHMA 04/11/2010  . VERTIGO 04/11/2010  . Hypothyroidism 01/19/2010  . Hyperlipidemia associated with type 2 diabetes mellitus ([place]) 01/19/2010  . Essential hypertension 01/19/2010  . GERD 01/19/2010  . PEPTIC ULCER DISEASE 01/19/2010  . OSTEOARTHRITIS 01/19/2010  . LOW BACK PAIN 01/19/2010    Zeynab Klett 09/19/2016, 11:43 AM  [place], [place], [address] Phone: [phone]   Fax:  [phone]  Name: Tracey Evans MRN: [id number] Date of Birth: 1945-06-10  Tracey Evans, PT 09/19/16 11:44 AM

## 2016-09-21 ENCOUNTER — Ambulatory Visit: Payer: Medicare Other

## 2016-09-21 ENCOUNTER — Ambulatory Visit: Payer: Medicare Other | Admitting: Physical Therapy

## 2016-09-21 DIAGNOSIS — M5134 Other intervertebral disc degeneration, thoracic region: Secondary | ICD-10-CM | POA: Diagnosis not present

## 2016-09-21 DIAGNOSIS — S134XXD Sprain of ligaments of cervical spine, subsequent encounter: Secondary | ICD-10-CM | POA: Diagnosis not present

## 2016-09-21 DIAGNOSIS — M542 Cervicalgia: Secondary | ICD-10-CM | POA: Diagnosis not present

## 2016-09-21 DIAGNOSIS — S239XXS Sprain of unspecified parts of thorax, sequela: Secondary | ICD-10-CM | POA: Diagnosis not present

## 2016-09-21 DIAGNOSIS — R222 Localized swelling, mass and lump, trunk: Secondary | ICD-10-CM

## 2016-09-21 NOTE — Patient Instructions (Signed)
DO THIS ONCE OR TWICE A DAY. Lie on your back or in a recliner chair. Remember these things:  Use a flat hand to do the strokes, pretend your hand is glued to the skin so that it moves the skin and doesn't slide over it, and do this slowly. Do each motion about 7 times.  1)  Cross your arms and put your hands beside your neck and behind your collarbones.  Do stationary circles there, stretching the skin through half the circle and then just relaxing back to the starting point (letting the elasticity of the skin bring you back to the starting point).  2)  Place a hand at your left lower abdomen (above the pelvic bone) and "scoop" gently up and in.  Do the same at right lower abdomen, at right upper abdomen (below ribs) and at left upper abdomen.  3)  Place one hand on top of the other about at your belly button.  As you take a slow deep breath in, breathe in deep so your belly balloons out, but put a little pressure on with your hands. Then as you relax your breath out, put a little pressure on and a slight spiraling motion.  Do this a couple of times in the middle at the belly button, a couple of times to the left and to the right, and then back in the middle.  4)  Place hands on the right at the "panty line" or groin and do stationary circles there; then do the same on the left.  5)  "Pump" down the right side from just below the bra line to the right groin; do the same on the left.  6)  "Pump" down from just below the right breast area toward the right groin.  7)  From an imaginary line down your middle through the belly button, "push" gently toward your left side, starting with a line just below bra level and doing subsequent lines farther and farther down the left abdomen, stretching the skin toward the left every time.  8)  Repeat the "pumps" down your left side.  9)  End with your stationary circles at right and left groin.

## 2016-09-21 NOTE — Therapy (Signed)
Tracey Evans, [place], [address] Phone: [phone]   Fax:  [phone]  Physical Therapy Treatment  Patient Details  Name: Tracey Evans MRN: [id number] Date of Birth: 18-Jul-1945 Referring Provider: Dr. Nicholas Lose  Encounter Date: 09/21/2016      PT End of Session - 09/21/16 1226    Visit Number 3   Number of Visits 8   Date for PT Re-Evaluation 10/12/16   PT Start Time 0755   PT Stop Time 0845   PT Time Calculation (min) 50 min   Activity Tolerance Patient tolerated treatment well   Behavior During Therapy [name] for tasks assessed/performed      Past Medical History:  Diagnosis Date  . Angina at rest [facility])    "occurs whenever it wants to, but worse during agitation"  . Anxiety   . Asthma   . Binge eating disorder   . Breast cancer of lower-outer quadrant of left female breast ([place]) 07/08/2015   "NEVER HAD LEFT BREAST CANCER" (08/15/2015)  . Cancer of right breast ([place]) 08/15/2015  . Chronic bronchitis ([place])   . Chronic lower back pain   . Complication of anesthesia    had bronc spasms during intubation surgery 2009 on foot-need albuterol inhaler or neb tx preop  . Costochondritis   . Depression   . Diverticular disease   . Fibromyalgia   . GERD (gastroesophageal reflux disease)   . Hot flashes   . Hyperlipidemia   . Hypertension   . Hypothyroidism   . Osteoarthritis    "all over"  . Peptic ulcer disease   . Personal history of chemotherapy   . Shortness of breath dyspnea   . Type II diabetes mellitus ([place])    "diet controlled" (08/15/2015)    Past Surgical History:  Procedure Laterality Date  . ABDOMINAL HYSTERECTOMY     "left my ovaries"  . ANKLE ARTHROSCOPY  01/02/2012   Procedure: ANKLE ARTHROSCOPY;  Surgeon: Colin Rhein, MD;  Location: [place];  Service: Orthopedics;  Laterality: Right;  right ankle arthroscopy with extensive debridement , dridement and  drilling talar dome osteochondral lesion  . ANKLE FRACTURE SURGERY Right 2009   Dr. Rolena Infante  . BREAST BIOPSY  07/2015  . CARDIAC CATHETERIZATION    . CARPAL TUNNEL RELEASE Bilateral   . COLONOSCOPY    . DILATION AND CURETTAGE OF UTERUS     "before hysterectomy"  . FRACTURE SURGERY    . KNEE ARTHROSCOPY Bilateral   . LAPAROSCOPIC CHOLECYSTECTOMY    . MASTECTOMY COMPLETE / SIMPLE W/ SENTINEL NODE BIOPSY Right 08/15/2015  . MASTECTOMY W/ SENTINEL NODE BIOPSY Right 08/15/2015   Procedure: TOTAL MASTECTOMY WITH SENTINEL LYMPH NODE BIOPSY AND BLUE DYE INJECTION ;  Surgeon: Fanny Skates, MD;  Location: [place];  Service: General;  Laterality: Right;  . PORTACATH PLACEMENT Left 08/15/2015  . PORTACATH PLACEMENT Left 08/15/2015   Procedure: INSERTION PORT-A-CATH ;  Surgeon: Fanny Skates, MD;  Location: [place];  Service: General;  Laterality: Left;  . SHOULDER ARTHROSCOPY Bilateral 2009-2011   "bone spurs"  . TONSILLECTOMY      There were no vitals filed for this visit.      Subjective Assessment - 09/21/16 0756    Subjective "They have stopped making the Cupid."  I slept in my garment last night   Currently in Pain? Yes   Pain Score 8    Pain Location Generalized   Pain Descriptors / Indicators Sore  Aggravating Factors  fibromyalgia flareup; touching it   Pain Relieving Factors nothing; not moving around to much                         [name] Adult PT Treatment/Exercise - 09/21/16 0001      Self-Care   Other Self-Care Comments  instructed patient in manual lymph drainage listed below     Manual Therapy   Manual Lymphatic Drainage (MLD) In supine, short neck, superficial and deep abdomen, right groin and axillo-inguinal anastomosis, then a bit medial to that, working from right abdomen to groin; left groin and then working from right abdomen toward left groin to drain fluid that direction.  instructed patient and had her perform this                PT Education  - 09/21/16 1226    Education provided Yes   Education Details self-manual lymph drainage   Person(s) Educated Patient   Methods Explanation;Handout;Tactile cues;Verbal cues   Comprehension Verbalized understanding;Returned demonstration                [name] Goals - 09/19/16 1142      CC Long Term Goal  #1   Title Swelling at 3 cm. superior to top of umbilicus will reduce at least 2 cm. to 107.3 cm. or less   Status On-going     CC Long Term Goal  #2   Title Pt. will be knowledgeable about proper compression garment for controlling abdominal swelling.   Status Partially Met     CC Long Term Goal  #3   Title Pt. will be knowledgeable about self-care for managing abdominal swelling.   Status On-going     CC Long Term Goal  #4   Title Lymphedema life impact scale score impairment reduced to <40%   Status On-going            Plan - 09/21/16 1228    Clinical Impression Statement Patient did well with learning self-manual lymph drainage today.   Rehab Potential Good   PT Frequency 2x / week   PT Duration 2 weeks  to 4 weeks prn   PT Treatment/Interventions ADLs/Self Care Home Management;DME Instruction;Therapeutic exercise;Patient/family education;Manual techniques;Manual lymph drainage;Compression bandaging;Taping   PT Next Visit Plan Review self-manual lymph drainage for abdomen.  Remeasure circumferences. Consider compression bandaging, but check fit if patient gets a new compression tank top.   PT Home Exercise Plan manual lymph drainage   Consulted and Agree with Plan of Care Patient      Patient will benefit from skilled therapeutic intervention in order to improve the following deficits and impairments:  Increased edema, Decreased knowledge of use of DME, Decreased knowledge of precautions  Visit Diagnosis: Localized swelling, mass and lump, trunk     Problem List Patient Active Problem List   Diagnosis Date Noted  . Shingles 02/14/2016  .  Anemia associated with nutritional deficiency 01/13/2016  . Infection of nail bed of finger 12/22/2015  . Syncope 10/05/2015  . Genetic testing 08/21/2015  . Family history of breast cancer in female 07/15/2015  . Family history of colon cancer 07/15/2015  . Breast cancer of lower-outer quadrant of right female breast ([place]) 07/08/2015  . Disturbance in sleep behavior 11/18/2014  . Fibromyalgia 10/04/2014  . Flu-like symptoms 07/09/2014  . Diverticulitis of colon (without mention of hemorrhage)(562.11) 12/01/2013  . UTI (urinary tract infection) 12/01/2013  . Abdominal pain, unspecified site 12/01/2013  .  Dry mouth 04/29/2013  . Dry eye 04/29/2013  . Dry skin 04/29/2013  . Obesity (BMI 30-39.9) 03/11/2013  . Grief 10/17/2012  . Edema 09/04/2012  . Depression 09/04/2012  . Chemotherapy-induced peripheral neuropathy ([place]) 05/12/2012  . OAB (overactive bladder) 04/01/2012  . DM w/o complication type II ([place]) 02/06/2012  . Colitis 01/21/2012  . Allergy to dogs 04/19/2011  . Polyarthralgia 09/18/2010  . STRESS INCONTINENCE 04/25/2010  . ASTHMA 04/11/2010  . VERTIGO 04/11/2010  . Hypothyroidism 01/19/2010  . Hyperlipidemia associated with type 2 diabetes mellitus ([place]) 01/19/2010  . Essential hypertension 01/19/2010  . GERD 01/19/2010  . PEPTIC ULCER DISEASE 01/19/2010  . OSTEOARTHRITIS 01/19/2010  . LOW BACK PAIN 01/19/2010    [place],DONNA 09/21/2016, 12:40 PM  [place], [place], [address] Phone: [phone]   Fax:  [phone]  Name: Tracey Evans MRN: [id number] Date of Birth: 1945/12/16  Serafina Royals, PT 09/21/16 12:40 PM

## 2016-09-24 ENCOUNTER — Ambulatory Visit: Payer: Medicare Other | Admitting: Physical Therapy

## 2016-09-24 DIAGNOSIS — R222 Localized swelling, mass and lump, trunk: Secondary | ICD-10-CM | POA: Diagnosis not present

## 2016-09-24 NOTE — Therapy (Signed)
[place], [place], [address] Phone: [phone]   Fax:  [phone]  Physical Therapy Treatment  Patient Details  Name: Tracey Evans MRN: [id number] Date of Birth: 1945-04-13 Referring Provider: Dr. Nicholas Lose  Encounter Date: 09/24/2016      PT End of Session - 09/24/16 0843    Visit Number 4   Number of Visits 8   Date for PT Re-Evaluation 10/12/16   PT Start Time 0801   PT Stop Time 1287   PT Time Calculation (min) 43 min   Activity Tolerance Patient tolerated treatment well   Behavior During Therapy [facility] for tasks assessed/performed      Past Medical History:  Diagnosis Date  . Angina at rest [facility])    "occurs whenever it wants to, but worse during agitation"  . Anxiety   . Asthma   . Binge eating disorder   . Breast cancer of lower-outer quadrant of left female breast ([place]) 07/08/2015   "NEVER HAD LEFT BREAST CANCER" (08/15/2015)  . Cancer of right breast ([place]) 08/15/2015  . Chronic bronchitis ([place])   . Chronic lower back pain   . Complication of anesthesia    had bronc spasms during intubation surgery 2009 on foot-need albuterol inhaler or neb tx preop  . Costochondritis   . Depression   . Diverticular disease   . Fibromyalgia   . GERD (gastroesophageal reflux disease)   . Hot flashes   . Hyperlipidemia   . Hypertension   . Hypothyroidism   . Osteoarthritis    "all over"  . Peptic ulcer disease   . Personal history of chemotherapy   . Shortness of breath dyspnea   . Type II diabetes mellitus ([place])    "diet controlled" (08/15/2015)    Past Surgical History:  Procedure Laterality Date  . ABDOMINAL HYSTERECTOMY     "left my ovaries"  . ANKLE ARTHROSCOPY  01/02/2012   Procedure: ANKLE ARTHROSCOPY;  Surgeon: Colin Rhein, MD;  Location: [place];  Service: Orthopedics;  Laterality: Right;  right ankle arthroscopy with extensive debridement , dridement and  drilling talar dome osteochondral lesion  . ANKLE FRACTURE SURGERY Right 2009   Dr. Rolena Infante  . BREAST BIOPSY  07/2015  . CARDIAC CATHETERIZATION    . CARPAL TUNNEL RELEASE Bilateral   . COLONOSCOPY    . DILATION AND CURETTAGE OF UTERUS     "before hysterectomy"  . FRACTURE SURGERY    . KNEE ARTHROSCOPY Bilateral   . LAPAROSCOPIC CHOLECYSTECTOMY    . MASTECTOMY COMPLETE / SIMPLE W/ SENTINEL NODE BIOPSY Right 08/15/2015  . MASTECTOMY W/ SENTINEL NODE BIOPSY Right 08/15/2015   Procedure: TOTAL MASTECTOMY WITH SENTINEL LYMPH NODE BIOPSY AND BLUE DYE INJECTION ;  Surgeon: Fanny Skates, MD;  Location: [place];  Service: General;  Laterality: Right;  . PORTACATH PLACEMENT Left 08/15/2015  . PORTACATH PLACEMENT Left 08/15/2015   Procedure: INSERTION PORT-A-CATH ;  Surgeon: Fanny Skates, MD;  Location: [place];  Service: General;  Laterality: Left;  . SHOULDER ARTHROSCOPY Bilateral 2009-2011   "bone spurs"  . TONSILLECTOMY      There were no vitals filed for this visit.      Subjective Assessment - 09/24/16 0803    Subjective "I hurt--my stomach, my neck and my head." Did the manual lymph drainage over the weekend and did fine. Hasn't worn compression recently because of stomach upset.   Currently in Pain? Yes   Pain Score 5  Pain Location Abdomen   Pain Descriptors / Indicators Sore   Aggravating Factors  "I ate [place] food Friday night and some more yesterday--probably just spicy food."   Pain Relieving Factors hasn't tried anything   Pain Score 8   Pain Location Neck  and head   Pain Descriptors / Indicators Other (Comment)  stiff neck   Aggravating Factors  not sleeping on two pillows   Pain Relieving Factors hasn't tried anything yet                         [facility] Adult PT Treatment/Exercise - 09/24/16 0001      Manual Therapy   Manual Lymphatic Drainage (MLD) In supine, short neck, superficial and deep abdomen, right groin and axillo-inguinal anastomosis, then a  bit medial to that, working from right abdomen to groin; left groin and left axillo-inguinal anastomosis from below bra line to groin, then working from right abdomen toward left and down to groin to drain fluid that direction.  instructed patient and had her perform this                PT Education - 09/24/16 0843    Education provided Yes   Education Details self-manual lymph drainage   Person(s) Educated Patient   Methods Explanation;Verbal cues   Comprehension Verbalized understanding;Returned demonstration                [facility] Goals - 09/19/16 1142      CC Long Term Goal  #1   Title Swelling at 3 cm. superior to top of umbilicus will reduce at least 2 cm. to 107.3 cm. or less   Status On-going     CC Long Term Goal  #2   Title Pt. will be knowledgeable about proper compression garment for controlling abdominal swelling.   Status Partially Met     CC Long Term Goal  #3   Title Pt. will be knowledgeable about self-care for managing abdominal swelling.   Status On-going     CC Long Term Goal  #4   Title Lymphedema life impact scale score impairment reduced to <40%   Status On-going            Plan - 09/24/16 0844    Clinical Impression Statement Patient needed review of self-manual lymph drainage for correct technique.  This was done today.   Rehab Potential Good   PT Frequency 2x / week   PT Duration 2 weeks   PT Treatment/Interventions ADLs/Self Care Home Management;DME Instruction;Therapeutic exercise;Patient/family education;Manual techniques;Manual lymph drainage;Compression bandaging;Taping   PT Next Visit Plan Remeasure circumferences. Continue manual lymph drainageConsider compression bandaging, but check fit if patient gets a new compression tank top.   PT Home Exercise Plan manual lymph drainage   Consulted and Agree with Plan of Care Patient      Patient will benefit from skilled therapeutic intervention in order to improve the  following deficits and impairments:  Increased edema, Decreased knowledge of use of DME, Decreased knowledge of precautions  Visit Diagnosis: Localized swelling, mass and lump, trunk     Problem List Patient Active Problem List   Diagnosis Date Noted  . Shingles 02/14/2016  . Anemia associated with nutritional deficiency 01/13/2016  . Infection of nail bed of finger 12/22/2015  . Syncope 10/05/2015  . Genetic testing 08/21/2015  . Family history of breast cancer in female 07/15/2015  . Family history of colon cancer 07/15/2015  . Breast  cancer of lower-outer quadrant of right female breast ([place]) 07/08/2015  . Disturbance in sleep behavior 11/18/2014  . Fibromyalgia 10/04/2014  . Flu-like symptoms 07/09/2014  . Diverticulitis of colon (without mention of hemorrhage)(562.11) 12/01/2013  . UTI (urinary tract infection) 12/01/2013  . Abdominal pain, unspecified site 12/01/2013  . Dry mouth 04/29/2013  . Dry eye 04/29/2013  . Dry skin 04/29/2013  . Obesity (BMI 30-39.9) 03/11/2013  . Grief 10/17/2012  . Edema 09/04/2012  . Depression 09/04/2012  . Chemotherapy-induced peripheral neuropathy ([place]) 05/12/2012  . OAB (overactive bladder) 04/01/2012  . DM w/o complication type II ([place]) 02/06/2012  . Colitis 01/21/2012  . Allergy to dogs 04/19/2011  . Polyarthralgia 09/18/2010  . STRESS INCONTINENCE 04/25/2010  . ASTHMA 04/11/2010  . VERTIGO 04/11/2010  . Hypothyroidism 01/19/2010  . Hyperlipidemia associated with type 2 diabetes mellitus ([place]) 01/19/2010  . Essential hypertension 01/19/2010  . GERD 01/19/2010  . PEPTIC ULCER DISEASE 01/19/2010  . OSTEOARTHRITIS 01/19/2010  . LOW BACK PAIN 01/19/2010    SALISBURY,DONNA 09/24/2016, 8:46 AM  [place], [place], [address] Phone: [phone]   Fax:  [phone]  Name: ALAHIA WHICKER MRN: [id number] Date of Birth: 19-Nov-1945  Serafina Royals, PT 09/24/16 8:46 AM

## 2016-09-26 ENCOUNTER — Ambulatory Visit: Payer: Medicare Other | Admitting: Physical Therapy

## 2016-09-26 DIAGNOSIS — R222 Localized swelling, mass and lump, trunk: Secondary | ICD-10-CM

## 2016-09-26 NOTE — Therapy (Signed)
[place], [place], [address] Phone: [phone]   Fax:  [phone]  Physical Therapy Treatment  Patient Details  Name: Tracey Evans MRN: [id number] Date of Birth: [birth year] Referring Provider: Dr. Nicholas Lose  Encounter Date: 09/26/2016      PT End of Session - 09/26/16 1251    Visit Number 5   Number of Visits 8   Date for PT Re-Evaluation 10/12/16   PT Start Time 0846   PT Stop Time 0935   PT Time Calculation (min) 49 min   Activity Tolerance Patient tolerated treatment well   Behavior During Therapy [facility] for tasks assessed/performed      Past Medical History:  Diagnosis Date  . Angina at rest [facility])    "occurs whenever it wants to, but worse during agitation"  . Anxiety   . Asthma   . Binge eating disorder   . Breast cancer of lower-outer quadrant of left female breast ([place]) 07/08/2015   "NEVER HAD LEFT BREAST CANCER" (08/15/2015)  . Cancer of right breast ([place]) 08/15/2015  . Chronic bronchitis ([place])   . Chronic lower back pain   . Complication of anesthesia    had bronc spasms during intubation surgery 2009 on foot-need albuterol inhaler or neb tx preop  . Costochondritis   . Depression   . Diverticular disease   . Fibromyalgia   . GERD (gastroesophageal reflux disease)   . Hot flashes   . Hyperlipidemia   . Hypertension   . Hypothyroidism   . Osteoarthritis    "all over"  . Peptic ulcer disease   . Personal history of chemotherapy   . Shortness of breath dyspnea   . Type II diabetes mellitus ([place])    "diet controlled" (08/15/2015)    Past Surgical History:  Procedure Laterality Date  . ABDOMINAL HYSTERECTOMY     "left my ovaries"  . ANKLE ARTHROSCOPY  01/02/2012   Procedure: ANKLE ARTHROSCOPY;  Surgeon: Colin Rhein, MD;  Location: [place];  Service: Orthopedics;  Laterality: Right;  right ankle arthroscopy with extensive debridement , dridement and  drilling talar dome osteochondral lesion  . ANKLE FRACTURE SURGERY Right 2009   Dr. Rolena Infante  . BREAST BIOPSY  07/2015  . CARDIAC CATHETERIZATION    . CARPAL TUNNEL RELEASE Bilateral   . COLONOSCOPY    . DILATION AND CURETTAGE OF UTERUS     "before hysterectomy"  . FRACTURE SURGERY    . KNEE ARTHROSCOPY Bilateral   . LAPAROSCOPIC CHOLECYSTECTOMY    . MASTECTOMY COMPLETE / SIMPLE W/ SENTINEL NODE BIOPSY Right 08/15/2015  . MASTECTOMY W/ SENTINEL NODE BIOPSY Right 08/15/2015   Procedure: TOTAL MASTECTOMY WITH SENTINEL LYMPH NODE BIOPSY AND BLUE DYE INJECTION ;  Surgeon: Fanny Skates, MD;  Location: [place];  Service: General;  Laterality: Right;  . PORTACATH PLACEMENT Left 08/15/2015  . PORTACATH PLACEMENT Left 08/15/2015   Procedure: INSERTION PORT-A-CATH ;  Surgeon: Fanny Skates, MD;  Location: [place];  Service: General;  Laterality: Left;  . SHOULDER ARTHROSCOPY Bilateral 2009-2011   "bone spurs"  . TONSILLECTOMY      There were no vitals filed for this visit.      Subjective Assessment - 09/26/16 0848    Subjective No pain today except the costochondritis, which is a lot better than it used to be.  I rested.  Went back on my meloxicam. Still hasn't worn compression recently because she had had an upset stomach, but she  has done her manual lymph drainage.   Currently in Pain? Yes   Pain Score 3    Pain Location Rib cage   Pain Orientation Right   Pain Relieving Factors rest, meloxicam               LYMPHEDEMA/ONCOLOGY QUESTIONNAIRE - 09/26/16 0928      Right Upper Extremity Lymphedema   Other 6 cm. superior to top of umbilicus, 761.9  measured after manual lymph drainage   Other 3 cm. superior to top of umbilicus 509   Other at mid-umbilicus 326.7                  [facility] Adult PT Treatment/Exercise - 09/26/16 0001      Manual Therapy   Manual Lymphatic Drainage (MLD) In supine, short neck, superficial and deep abdomen, right groin and axillo-inguinal  anastomosis, then a bit medial to that, working from right abdomen to groin; left groin and left axillo-inguinal anastomosis from below bra line to groin, then working from right abdomen toward left and down to groin to drain fluid that direction.                        [facility] Goals - 09/26/16 0850      CC Long Term Goal  #1   Title Swelling at 3 cm. superior to top of umbilicus will reduce at least 2 cm. to 107.3 cm. or less   Baseline 108.5 on 09/26/16   Status Partially Met     CC Long Term Goal  #2   Title Pt. will be knowledgeable about proper compression garment for controlling abdominal swelling.   Status Partially Met     CC Long Term Goal  #3   Title Pt. will be knowledgeable about self-care for managing abdominal swelling.   Status Partially Met     CC Long Term Goal  #4   Title Lymphedema life impact scale score impairment reduced to <40%            Plan - 09/26/16 1251    Clinical Impression Statement Circumference measurements taken today overall were reduced from last measurement, but there was some mixed results with before and after treatment measurements. She has not been able to wear compression, either day- or nighttime, recently because of an upset stomach, so progress is limited by that, even though she has been doing self-manual lymph drainage at home.   Rehab Potential Good   PT Frequency 2x / week   PT Duration 4 weeks   PT Treatment/Interventions ADLs/Self Care Home Management;DME Instruction;Therapeutic exercise;Patient/family education;Manual techniques;Manual lymph drainage;Compression bandaging;Taping   PT Next Visit Plan Check on use of compression. Continue manual lymph drainage; check fit if patient gets a new compression tank top.   PT Home Exercise Plan manual lymph drainage   Consulted and Agree with Plan of Care Patient      Patient will benefit from skilled therapeutic intervention in order to improve the following  deficits and impairments:  Increased edema, Decreased knowledge of use of DME, Decreased knowledge of precautions  Visit Diagnosis: Localized swelling, mass and lump, trunk     Problem List Patient Active Problem List   Diagnosis Date Noted  . Shingles 02/14/2016  . Anemia associated with nutritional deficiency 01/13/2016  . Infection of nail bed of finger 12/22/2015  . Syncope 10/05/2015  . Genetic testing 08/21/2015  . Family history of breast cancer in female 07/15/2015  .  Family history of colon cancer 07/15/2015  . Breast cancer of lower-outer quadrant of right female breast ([place]) 07/08/2015  . Disturbance in sleep behavior 11/18/2014  . Fibromyalgia 10/04/2014  . Flu-like symptoms 07/09/2014  . Diverticulitis of colon (without mention of hemorrhage)(562.11) 12/01/2013  . UTI (urinary tract infection) 12/01/2013  . Abdominal pain, unspecified site 12/01/2013  . Dry mouth 04/29/2013  . Dry eye 04/29/2013  . Dry skin 04/29/2013  . Obesity (BMI 30-39.9) 03/11/2013  . Grief 10/17/2012  . Edema 09/04/2012  . Depression 09/04/2012  . Chemotherapy-induced peripheral neuropathy ([place]) 05/12/2012  . OAB (overactive bladder) 04/01/2012  . DM w/o complication type II ([place]) 02/06/2012  . Colitis 01/21/2012  . Allergy to dogs 04/19/2011  . Polyarthralgia 09/18/2010  . STRESS INCONTINENCE 04/25/2010  . ASTHMA 04/11/2010  . VERTIGO 04/11/2010  . Hypothyroidism 01/19/2010  . Hyperlipidemia associated with type 2 diabetes mellitus ([place]) 01/19/2010  . Essential hypertension 01/19/2010  . GERD 01/19/2010  . PEPTIC ULCER DISEASE 01/19/2010  . OSTEOARTHRITIS 01/19/2010  . LOW BACK PAIN 01/19/2010    Furkan Keenum 09/26/2016, 12:54 PM  [place], [place], [address] Phone: [phone]   Fax:  [phone]  Name: Tracey Evans MRN: [id number] Date of Birth: [birth year]  Serafina Royals,  PT 09/26/16 12:54 PM

## 2016-09-27 ENCOUNTER — Telehealth: Payer: Self-pay | Admitting: *Deleted

## 2016-09-27 NOTE — Telephone Encounter (Signed)
  Oncology Nurse Navigator Documentation  Navigator Location: [facility] (09/27/16 1400)   )Navigator Encounter Type: Treatment (09/27/16 1400)                     Patient Visit Type: MedOnc (09/27/16 1400) Treatment Phase: Final Chemo TX;Treatment (09/27/16 1400)                            Time Spent with Patient: 15 (09/27/16 1400)

## 2016-09-28 ENCOUNTER — Ambulatory Visit (HOSPITAL_BASED_OUTPATIENT_CLINIC_OR_DEPARTMENT_OTHER): Payer: Medicare Other

## 2016-09-28 DIAGNOSIS — C50511 Malignant neoplasm of lower-outer quadrant of right female breast: Secondary | ICD-10-CM

## 2016-09-28 DIAGNOSIS — Z5112 Encounter for antineoplastic immunotherapy: Secondary | ICD-10-CM

## 2016-09-28 DIAGNOSIS — Z170 Estrogen receptor positive status [ER+]: Principal | ICD-10-CM

## 2016-09-28 MED ORDER — SODIUM CHLORIDE 0.9 % IV SOLN
Freq: Once | INTRAVENOUS | Status: AC
  Administered 2016-09-28: 10:00:00 via INTRAVENOUS

## 2016-09-28 MED ORDER — SODIUM CHLORIDE 0.9% FLUSH
10.0000 mL | INTRAVENOUS | Status: DC | PRN
  Administered 2016-09-28: 10 mL
  Filled 2016-09-28: qty 10

## 2016-09-28 MED ORDER — SODIUM CHLORIDE 0.9 % IV SOLN
Freq: Once | INTRAVENOUS | Status: AC
  Administered 2016-09-28: 11:00:00 via INTRAVENOUS
  Filled 2016-09-28: qty 250

## 2016-09-28 MED ORDER — TRASTUZUMAB CHEMO 150 MG IV SOLR
6.0000 mg/kg | Freq: Once | INTRAVENOUS | Status: AC
  Administered 2016-09-28: 483 mg via INTRAVENOUS
  Filled 2016-09-28: qty 23

## 2016-09-28 MED ORDER — HEPARIN SOD (PORK) LOCK FLUSH 100 UNIT/ML IV SOLN
500.0000 [IU] | Freq: Once | INTRAVENOUS | Status: AC | PRN
  Administered 2016-09-28: 500 [IU]
  Filled 2016-09-28: qty 5

## 2016-09-28 MED ORDER — ACETAMINOPHEN 325 MG PO TABS
ORAL_TABLET | ORAL | Status: AC
  Filled 2016-09-28: qty 2

## 2016-09-28 MED ORDER — DIPHENHYDRAMINE HCL 25 MG PO CAPS
ORAL_CAPSULE | ORAL | Status: AC
  Filled 2016-09-28: qty 2

## 2016-09-28 MED ORDER — DIPHENHYDRAMINE HCL 25 MG PO CAPS
50.0000 mg | ORAL_CAPSULE | Freq: Once | ORAL | Status: AC
  Administered 2016-09-28: 50 mg via ORAL

## 2016-09-28 MED ORDER — ACETAMINOPHEN 325 MG PO TABS
650.0000 mg | ORAL_TABLET | Freq: Once | ORAL | Status: AC
  Administered 2016-09-28: 650 mg via ORAL

## 2016-09-28 NOTE — Patient Instructions (Signed)
[facility] Discharge Instructions for Patients Receiving Chemotherapy  Today you received the following chemotherapy agents herceptin   To help prevent nausea and vomiting after your treatment, we encourage you to take your nausea medication as directed   If you develop nausea and vomiting that is not controlled by your nausea medication, call the clinic.   BELOW ARE SYMPTOMS THAT SHOULD BE REPORTED IMMEDIATELY:  *FEVER GREATER THAN 100.5 F  *CHILLS WITH OR WITHOUT FEVER  NAUSEA AND VOMITING THAT IS NOT CONTROLLED WITH YOUR NAUSEA MEDICATION  *UNUSUAL SHORTNESS OF BREATH  *UNUSUAL BRUISING OR BLEEDING  TENDERNESS IN MOUTH AND THROAT WITH OR WITHOUT PRESENCE OF ULCERS  *URINARY PROBLEMS  *BOWEL PROBLEMS  UNUSUAL RASH Items with * indicate a potential emergency and should be followed up as soon as possible.  Feel free to call the clinic you have any questions or concerns. The clinic phone number is [phone].  

## 2016-10-01 ENCOUNTER — Ambulatory Visit: Payer: Medicare Other | Admitting: Physical Therapy

## 2016-10-01 DIAGNOSIS — R222 Localized swelling, mass and lump, trunk: Secondary | ICD-10-CM

## 2016-10-01 NOTE — Therapy (Signed)
[place], [place], [address] Phone: [phone]   Fax:  [phone]  Physical Therapy Treatment  Patient Details  Name: Tracey Evans MRN: [id number] Date of Birth: [birth year] Referring Provider: Dr. Nicholas Lose  Encounter Date: 10/01/2016      PT End of Session - 10/01/16 1252    Visit Number 6   Number of Visits 8   Date for PT Re-Evaluation 10/12/16   PT Start Time 0802   PT Stop Time 4742   PT Time Calculation (min) 42 min   Activity Tolerance Patient tolerated treatment well   Behavior During Therapy [facility] for tasks assessed/performed      Past Medical History:  Diagnosis Date  . Angina at rest [facility])    "occurs whenever it wants to, but worse during agitation"  . Anxiety   . Asthma   . Binge eating disorder   . Breast cancer of lower-outer quadrant of left female breast ([place]) 07/08/2015   "NEVER HAD LEFT BREAST CANCER" (08/15/2015)  . Cancer of right breast ([place]) 08/15/2015  . Chronic bronchitis ([place])   . Chronic lower back pain   . Complication of anesthesia    had bronc spasms during intubation surgery 2009 on foot-need albuterol inhaler or neb tx preop  . Costochondritis   . Depression   . Diverticular disease   . Fibromyalgia   . GERD (gastroesophageal reflux disease)   . Hot flashes   . Hyperlipidemia   . Hypertension   . Hypothyroidism   . Osteoarthritis    "all over"  . Peptic ulcer disease   . Personal history of chemotherapy   . Shortness of breath dyspnea   . Type II diabetes mellitus ([place])    "diet controlled" (08/15/2015)    Past Surgical History:  Procedure Laterality Date  . ABDOMINAL HYSTERECTOMY     "left my ovaries"  . ANKLE ARTHROSCOPY  01/02/2012   Procedure: ANKLE ARTHROSCOPY;  Surgeon: Colin Rhein, MD;  Location: [place];  Service: Orthopedics;  Laterality: Right;  right ankle arthroscopy with extensive debridement , dridement and  drilling talar dome osteochondral lesion  . ANKLE FRACTURE SURGERY Right 2009   Dr. Rolena Infante  . BREAST BIOPSY  07/2015  . CARDIAC CATHETERIZATION    . CARPAL TUNNEL RELEASE Bilateral   . COLONOSCOPY    . DILATION AND CURETTAGE OF UTERUS     "before hysterectomy"  . FRACTURE SURGERY    . KNEE ARTHROSCOPY Bilateral   . LAPAROSCOPIC CHOLECYSTECTOMY    . MASTECTOMY COMPLETE / SIMPLE W/ SENTINEL NODE BIOPSY Right 08/15/2015  . MASTECTOMY W/ SENTINEL NODE BIOPSY Right 08/15/2015   Procedure: TOTAL MASTECTOMY WITH SENTINEL LYMPH NODE BIOPSY AND BLUE DYE INJECTION ;  Surgeon: Fanny Skates, MD;  Location: [place];  Service: General;  Laterality: Right;  . PORTACATH PLACEMENT Left 08/15/2015  . PORTACATH PLACEMENT Left 08/15/2015   Procedure: INSERTION PORT-A-CATH ;  Surgeon: Fanny Skates, MD;  Location: [place];  Service: General;  Laterality: Left;  . SHOULDER ARTHROSCOPY Bilateral 2009-2011   "bone spurs"  . TONSILLECTOMY      There were no vitals filed for this visit.      Subjective Assessment - 10/01/16 0804    Subjective "I'm having a rough morning." Has been wearing daytime garment but not the nighttime one. I had a wonderful day Friday; it was my last infusion day.   Currently in Pain? No/denies  [facility] Adult PT Treatment/Exercise - 10/01/16 0001      Manual Therapy   Manual Therapy Taping   Manual Lymphatic Drainage (MLD) In supine, short neck, superficial and deep abdomen, right groin and axillo-inguinal anastomosis, then a bit medial to that, working from right abdomen to groin; left groin and left axillo-inguinal anastomosis from below bra line to groin, then working from right abdomen toward left and down to groin to drain fluid that direction.   Kinesiotex Edema  backet and fan shape, bucket at right groin, fan Rt abdomen                PT Education - 10/01/16 0843    Education provided Yes   Education Details to remove Kinesiotape  in case of skin irritation from tape and how to do it; otherwise to leave it on and gauge whether it helps her   Person(s) Educated Patient   Methods Explanation;Verbal cues   Comprehension Verbalized understanding                [facility] Goals - 09/26/16 0850      CC Long Term Goal  #1   Title Swelling at 3 cm. superior to top of umbilicus will reduce at least 2 cm. to 107.3 cm. or less   Baseline 108.5 on 09/26/16   Status Partially Met     CC Long Term Goal  #2   Title Pt. will be knowledgeable about proper compression garment for controlling abdominal swelling.   Status Partially Met     CC Long Term Goal  #3   Title Pt. will be knowledgeable about self-care for managing abdominal swelling.   Status Partially Met     CC Long Term Goal  #4   Title Lymphedema life impact scale score impairment reduced to <40%            Plan - 10/01/16 1252    Clinical Impression Statement Has worn her daytime but not nighttime compression recently; said she would put the nighttime garment on soon.  Tried kinesiotape for edema today.   Rehab Potential Good   PT Frequency 2x / week   PT Duration 4 weeks   PT Treatment/Interventions ADLs/Self Care Home Management;DME Instruction;Therapeutic exercise;Patient/family education;Manual techniques;Manual lymph drainage;Compression bandaging;Taping   PT Next Visit Plan Check on benefit of Kinesiotape and use of compression. Check goals and decide on continuing or not. Continue manual lymph drainage; check fit if patient gets a new compression tank top.    PT Home Exercise Plan manual lymph drainage   Consulted and Agree with Plan of Care Patient      Patient will benefit from skilled therapeutic intervention in order to improve the following deficits and impairments:  Increased edema, Decreased knowledge of use of DME, Decreased knowledge of precautions  Visit Diagnosis: Localized swelling, mass and lump, trunk     Problem  List Patient Active Problem List   Diagnosis Date Noted  . Shingles 02/14/2016  . Anemia associated with nutritional deficiency 01/13/2016  . Infection of nail bed of finger 12/22/2015  . Syncope 10/05/2015  . Genetic testing 08/21/2015  . Family history of breast cancer in female 07/15/2015  . Family history of colon cancer 07/15/2015  . Breast cancer of lower-outer quadrant of right female breast ([place]) 07/08/2015  . Disturbance in sleep behavior 11/18/2014  . Fibromyalgia 10/04/2014  . Flu-like symptoms 07/09/2014  . Diverticulitis of colon (without mention of hemorrhage)(562.11) 12/01/2013  . UTI (urinary tract infection)  12/01/2013  . Abdominal pain, unspecified site 12/01/2013  . Dry mouth 04/29/2013  . Dry eye 04/29/2013  . Dry skin 04/29/2013  . Obesity (BMI 30-39.9) 03/11/2013  . Grief 10/17/2012  . Edema 09/04/2012  . Depression 09/04/2012  . Chemotherapy-induced peripheral neuropathy ([place]) 05/12/2012  . OAB (overactive bladder) 04/01/2012  . DM w/o complication type II ([place]) 02/06/2012  . Colitis 01/21/2012  . Allergy to dogs 04/19/2011  . Polyarthralgia 09/18/2010  . STRESS INCONTINENCE 04/25/2010  . ASTHMA 04/11/2010  . VERTIGO 04/11/2010  . Hypothyroidism 01/19/2010  . Hyperlipidemia associated with type 2 diabetes mellitus ([place]) 01/19/2010  . Essential hypertension 01/19/2010  . GERD 01/19/2010  . PEPTIC ULCER DISEASE 01/19/2010  . OSTEOARTHRITIS 01/19/2010  . LOW BACK PAIN 01/19/2010    SALISBURY,DONNA 10/01/2016, 12:54 PM  [place], [place], [address] Phone: [phone]   Fax:  [phone]  Name: STORY CONTI MRN: [id number] Date of Birth: 1946-03-15  Serafina Royals, PT 10/01/16 12:54 PM

## 2016-10-03 ENCOUNTER — Ambulatory Visit: Payer: Medicare Other | Admitting: Physical Therapy

## 2016-10-03 DIAGNOSIS — R222 Localized swelling, mass and lump, trunk: Secondary | ICD-10-CM | POA: Diagnosis not present

## 2016-10-03 NOTE — Therapy (Signed)
[place], [place], [address] Phone: [phone]   Fax:  [phone]  Physical Therapy Treatment  Patient Details  Name: Tracey Evans MRN: [id number] Date of Birth: 01/21/1946 Referring Provider: Dr. Nicholas Lose  Encounter Date: 10/03/2016      PT End of Session - 10/03/16 1721    Visit Number 7   Number of Visits 8   Date for PT Re-Evaluation 10/12/16   PT Start Time 0848   PT Stop Time 0928   PT Time Calculation (min) 40 min   Activity Tolerance Patient tolerated treatment well   Behavior During Therapy [facility] for tasks assessed/performed      Past Medical History:  Diagnosis Date  . Angina at rest [name])    "occurs whenever it wants to, but worse during agitation"  . Anxiety   . Asthma   . Binge eating disorder   . Breast cancer of lower-outer quadrant of left female breast ([place]) 07/08/2015   "NEVER HAD LEFT BREAST CANCER" (08/15/2015)  . Cancer of right breast ([place]) 08/15/2015  . Chronic bronchitis ([place])   . Chronic lower back pain   . Complication of anesthesia    had bronc spasms during intubation surgery 2009 on foot-need albuterol inhaler or neb tx preop  . Costochondritis   . Depression   . Diverticular disease   . Fibromyalgia   . GERD (gastroesophageal reflux disease)   . Hot flashes   . Hyperlipidemia   . Hypertension   . Hypothyroidism   . Osteoarthritis    "all over"  . Peptic ulcer disease   . Personal history of chemotherapy   . Shortness of breath dyspnea   . Type II diabetes mellitus ([place])    "diet controlled" (08/15/2015)    Past Surgical History:  Procedure Laterality Date  . ABDOMINAL HYSTERECTOMY     "left my ovaries"  . ANKLE ARTHROSCOPY  01/02/2012   Procedure: ANKLE ARTHROSCOPY;  Surgeon: Colin Rhein, MD;  Location: [place];  Service: Orthopedics;  Laterality: Right;  right ankle arthroscopy with extensive debridement , dridement and  drilling talar dome osteochondral lesion  . ANKLE FRACTURE SURGERY Right 2009   Dr. Rolena Infante  . BREAST BIOPSY  07/2015  . CARDIAC CATHETERIZATION    . CARPAL TUNNEL RELEASE Bilateral   . COLONOSCOPY    . DILATION AND CURETTAGE OF UTERUS     "before hysterectomy"  . FRACTURE SURGERY    . KNEE ARTHROSCOPY Bilateral   . LAPAROSCOPIC CHOLECYSTECTOMY    . MASTECTOMY COMPLETE / SIMPLE W/ SENTINEL NODE BIOPSY Right 08/15/2015  . MASTECTOMY W/ SENTINEL NODE BIOPSY Right 08/15/2015   Procedure: TOTAL MASTECTOMY WITH SENTINEL LYMPH NODE BIOPSY AND BLUE DYE INJECTION ;  Surgeon: Fanny Skates, MD;  Location: [place];  Service: General;  Laterality: Right;  . PORTACATH PLACEMENT Left 08/15/2015  . PORTACATH PLACEMENT Left 08/15/2015   Procedure: INSERTION PORT-A-CATH ;  Surgeon: Fanny Skates, MD;  Location: [place];  Service: General;  Laterality: Left;  . SHOULDER ARTHROSCOPY Bilateral 2009-2011   "bone spurs"  . TONSILLECTOMY      There were no vitals filed for this visit.      Subjective Assessment - 10/03/16 0850    Subjective "I'm very depressed today, in part about this (swelling)." Has been wearing the binder, but it doesn't seem to be doing anything. Tried her massager at home, but can't tell that it  made any difference. Has on a  new bra.  Kinesiotape didn't seem to do anything.   Currently in Pain? Yes   Pain Score --  pulling more than pain   Pain Location Foot  big toe   Pain Orientation Left   Pain Descriptors / Indicators Other (Comment)  pulling more than pain; toenail is falling off   Aggravating Factors  something catching on it   Pain Relieving Factors leaving it uncovered            [facility] PT Assessment - 10/03/16 0001      Observation/Other Assessments   Other Surveys  --  LLIS score is 21= 31% impairment           LYMPHEDEMA/ONCOLOGY QUESTIONNAIRE - 10/03/16 0857      Right Upper Extremity Lymphedema   Other 6 cm. superior to top of umbilicus, 333.8  measured  prior to manual lymph drainage   Other 3 cm. superior to top of umbilicus 329.1   Other at mid-umbilicus 916.6                                  Long Term Clinic Goals - 10/03/16 1720      CC Long Term Goal  #1   Title Swelling at 3 cm. superior to top of umbilicus will reduce at least 2 cm. to 107.3 cm. or less   Status On-going     CC Long Term Goal  #2   Title Pt. will be knowledgeable about proper compression garment for controlling abdominal swelling.   Status Achieved     CC Long Term Goal  #3   Title Pt. will be knowledgeable about self-care for managing abdominal swelling.   Status Partially Met     CC Long Term Goal  #4   Title Lymphedema life impact scale score impairment reduced to <40%   Baseline 46% at eval; 31% on 10/03/16   Status Achieved            Plan - 10/03/16 1721    Clinical Impression Statement Patient is frustrated at lack of progress.  Measurements of abdominal circumference did not improve this time.  She has had intermittent use of her velcro abdominal binder and her compression camisoles.  She would like to get a smaller size camisole but hasn't done that yet.  She has been instructed in self-manual lymph drainage.     Rehab Potential Good   PT Frequency 2x / week   PT Duration 4 weeks   PT Treatment/Interventions ADLs/Self Care Home Management;DME Instruction;Therapeutic exercise;Patient/family education;Manual techniques;Manual lymph drainage;Compression bandaging;Taping   PT Next Visit Plan Patient is going to continue manual lymph drainage at home and make concerted effort to use her day- and nighttime compression garments.  She will return in about two weeks for remeasurements and we will decide at that time whether to DC or if there is any more that we can do. She would need a renewal if that is the decision.   PT Home Exercise Plan manual lymph drainage, wear compression most all of the time (except for showering and  manual lymph drainage)   Consulted and Agree with Plan of Care Patient      Patient will benefit from skilled therapeutic intervention in order to improve the following deficits and impairments:  Increased edema, Decreased knowledge of use of DME, Decreased knowledge of precautions  Visit Diagnosis: Localized swelling, mass and lump, trunk  Problem List Patient Active Problem List   Diagnosis Date Noted  . Shingles 02/14/2016  . Anemia associated with nutritional deficiency 01/13/2016  . Infection of nail bed of finger 12/22/2015  . Syncope 10/05/2015  . Genetic testing 08/21/2015  . Family history of breast cancer in female 07/15/2015  . Family history of colon cancer 07/15/2015  . Breast cancer of lower-outer quadrant of right female breast ([place]) 07/08/2015  . Disturbance in sleep behavior 11/18/2014  . Fibromyalgia 10/04/2014  . Flu-like symptoms 07/09/2014  . Diverticulitis of colon (without mention of hemorrhage)(562.11) 12/01/2013  . UTI (urinary tract infection) 12/01/2013  . Abdominal pain, unspecified site 12/01/2013  . Dry mouth 04/29/2013  . Dry eye 04/29/2013  . Dry skin 04/29/2013  . Obesity (BMI 30-39.9) 03/11/2013  . Grief 10/17/2012  . Edema 09/04/2012  . Depression 09/04/2012  . Chemotherapy-induced peripheral neuropathy ([place]) 05/12/2012  . OAB (overactive bladder) 04/01/2012  . DM w/o complication type II ([place]) 02/06/2012  . Colitis 01/21/2012  . Allergy to dogs 04/19/2011  . Polyarthralgia 09/18/2010  . STRESS INCONTINENCE 04/25/2010  . ASTHMA 04/11/2010  . VERTIGO 04/11/2010  . Hypothyroidism 01/19/2010  . Hyperlipidemia associated with type 2 diabetes mellitus ([place]) 01/19/2010  . Essential hypertension 01/19/2010  . GERD 01/19/2010  . PEPTIC ULCER DISEASE 01/19/2010  . OSTEOARTHRITIS 01/19/2010  . LOW BACK PAIN 01/19/2010    Candy Leverett 10/03/2016, 5:25 PM  [place], [place], [address] Phone: [phone]   Fax:  [phone]  Name: Tracey Evans MRN: [id number] Date of Birth: 19-Feb-1946  Serafina Royals, PT 10/03/16 5:26 PM

## 2016-10-11 ENCOUNTER — Other Ambulatory Visit: Payer: Self-pay | Admitting: Family Medicine

## 2016-10-22 ENCOUNTER — Ambulatory Visit: Payer: Medicare Other | Attending: Hematology and Oncology | Admitting: Physical Therapy

## 2016-10-22 DIAGNOSIS — R222 Localized swelling, mass and lump, trunk: Secondary | ICD-10-CM

## 2016-10-22 NOTE — Therapy (Signed)
[place], [place], [address] Phone: [phone]   Fax:  [phone]  Physical Therapy Treatment  Patient Details  Name: Tracey Evans MRN: [id number] Date of Birth: 1946/02/19 Referring Provider: Dr. Nicholas Lose  Encounter Date: 10/22/2016      PT End of Session - 10/22/16 1323    Visit Number 8   Number of Visits 8   PT Start Time 0935  2 units charged is correct for today   PT Stop Time 1018   PT Time Calculation (min) 43 min   Activity Tolerance Patient tolerated treatment well   Behavior During Therapy [facility] for tasks assessed/performed      Past Medical History:  Diagnosis Date  . Angina at rest [facility])    "occurs whenever it wants to, but worse during agitation"  . Anxiety   . Asthma   . Binge eating disorder   . Breast cancer of lower-outer quadrant of left female breast ([place]) 07/08/2015   "NEVER HAD LEFT BREAST CANCER" (08/15/2015)  . Cancer of right breast ([place]) 08/15/2015  . Chronic bronchitis ([place])   . Chronic lower back pain   . Complication of anesthesia    had bronc spasms during intubation surgery 2009 on foot-need albuterol inhaler or neb tx preop  . Costochondritis   . Depression   . Diverticular disease   . Fibromyalgia   . GERD (gastroesophageal reflux disease)   . Hot flashes   . Hyperlipidemia   . Hypertension   . Hypothyroidism   . Osteoarthritis    "all over"  . Peptic ulcer disease   . Personal history of chemotherapy   . Shortness of breath dyspnea   . Type II diabetes mellitus ([place])    "diet controlled" (08/15/2015)    Past Surgical History:  Procedure Laterality Date  . ABDOMINAL HYSTERECTOMY     "left my ovaries"  . ANKLE ARTHROSCOPY  01/02/2012   Procedure: ANKLE ARTHROSCOPY;  Surgeon: Colin Rhein, MD;  Location: [place];  Service: Orthopedics;  Laterality: Right;  right ankle arthroscopy with extensive debridement , dridement and  drilling talar dome osteochondral lesion  . ANKLE FRACTURE SURGERY Right 2009   Dr. Rolena Infante  . BREAST BIOPSY  07/2015  . CARDIAC CATHETERIZATION    . CARPAL TUNNEL RELEASE Bilateral   . COLONOSCOPY    . DILATION AND CURETTAGE OF UTERUS     "before hysterectomy"  . FRACTURE SURGERY    . KNEE ARTHROSCOPY Bilateral   . LAPAROSCOPIC CHOLECYSTECTOMY    . MASTECTOMY COMPLETE / SIMPLE W/ SENTINEL NODE BIOPSY Right 08/15/2015  . MASTECTOMY W/ SENTINEL NODE BIOPSY Right 08/15/2015   Procedure: TOTAL MASTECTOMY WITH SENTINEL LYMPH NODE BIOPSY AND BLUE DYE INJECTION ;  Surgeon: Fanny Skates, MD;  Location: [place];  Service: General;  Laterality: Right;  . PORTACATH PLACEMENT Left 08/15/2015  . PORTACATH PLACEMENT Left 08/15/2015   Procedure: INSERTION PORT-A-CATH ;  Surgeon: Fanny Skates, MD;  Location: [place];  Service: General;  Laterality: Left;  . SHOULDER ARTHROSCOPY Bilateral 2009-2011   "bone spurs"  . TONSILLECTOMY      There were no vitals filed for this visit.      Subjective Assessment - 10/22/16 0937    Subjective "I had to get an order of protection and the sheriffs got him to leave last Thursday." I've been a nervous wreck all weekend.  I wore compression garments day and nighttime until Wednesday, but nothing since then.  Got sore one day and wonders if that was from wearing the binder too tight.  The white binder is now too big, but has a black that can be tightened up. Having her portacath removed tomorrow morning.   Currently in Pain? No/denies               LYMPHEDEMA/ONCOLOGY QUESTIONNAIRE - 11/01/16 0954      Right Upper Extremity Lymphedema   Other 6 cm. superior to top of umbilicus, 854.6  measured prior to manual lymph drainage   Other 3 cm. superior to top of umbilicus 270.3   Other at mid-umbilicus 500.9                  [facility] Adult PT Treatment/Exercise - 11-01-16 0001      Manual Therapy   Manual Therapy Edema management   Manual Lymphatic  Drainage (MLD) In supine, short neck, superficial and deep abdomen, right groin and axillo-inguinal anastomosis, then a bit medial to that, working from right abdomen to groin; left groin and left axillo-inguinal anastomosis from below bra line to groin, then working from right abdomen toward left and down to groin to drain fluid that direction.                        [facility] Goals - 2016/11/01 1329      CC Long Term Goal  #1   Title Swelling at 3 cm. superior to top of umbilicus will reduce at least 2 cm. to 107.3 cm. or less   Status Not Met     CC Long Term Goal  #2   Title Pt. will be knowledgeable about proper compression garment for controlling abdominal swelling.   Status Achieved     CC Long Term Goal  #3   Title Pt. will be knowledgeable about self-care for managing abdominal swelling.   Status Achieved     CC Long Term Goal  #4   Title Lymphedema life impact scale score impairment reduced to <40%   Status Achieved            Plan - 11-01-16 1324    Clinical Impression Statement Patient on the one hand feels like therapy has not been helping, but on the other, looks down at her abdomen and feels it looks reduced.  Circumference measurements show reductions at 2 of 3 levels measured around her abdomen.  She has not worn compression in the last few days because of personal stressors. She feels ready for discharge today.   Rehab Potential Good   PT Frequency 2x / week   PT Duration 4 weeks   PT Treatment/Interventions ADLs/Self Care Home Management;DME Instruction;Therapeutic exercise;Patient/family education;Manual techniques;Manual lymph drainage;Compression bandaging;Taping   PT Next Visit Plan None--discharge today   PT Home Exercise Plan manual lymph drainage, wear compression most all of the time (except for showering and manual lymph drainage)   Consulted and Agree with Plan of Care Patient      Patient will benefit from skilled therapeutic  intervention in order to improve the following deficits and impairments:  Increased edema, Decreased knowledge of use of DME, Decreased knowledge of precautions  Visit Diagnosis: Localized swelling, mass and lump, trunk       G-Codes - 2016/11/01 1330    Functional Assessment Tool Used (Outpatient Only) lymphedema life impact scale   Functional Limitation Self care   Self Care Goal Status (F8182) At least 20 percent but less than 40 percent  impaired, limited or restricted   Self Care Discharge Status [phone]) At least 20 percent but less than 40 percent impaired, limited or restricted      Problem List Patient Active Problem List   Diagnosis Date Noted  . Shingles 02/14/2016  . Anemia associated with nutritional deficiency 01/13/2016  . Infection of nail bed of finger 12/22/2015  . Syncope 10/05/2015  . Genetic testing 08/21/2015  . Family history of breast cancer in female 07/15/2015  . Family history of colon cancer 07/15/2015  . Breast cancer of lower-outer quadrant of right female breast ([place]) 07/08/2015  . Disturbance in sleep behavior 11/18/2014  . Fibromyalgia 10/04/2014  . Flu-like symptoms 07/09/2014  . Diverticulitis of colon (without mention of hemorrhage)(562.11) 12/01/2013  . UTI (urinary tract infection) 12/01/2013  . Abdominal pain, unspecified site 12/01/2013  . Dry mouth 04/29/2013  . Dry eye 04/29/2013  . Dry skin 04/29/2013  . Obesity (BMI 30-39.9) 03/11/2013  . Grief 10/17/2012  . Edema 09/04/2012  . Depression 09/04/2012  . Chemotherapy-induced peripheral neuropathy ([place]) 05/12/2012  . OAB (overactive bladder) 04/01/2012  . DM w/o complication type II ([place]) 02/06/2012  . Colitis 01/21/2012  . Allergy to dogs 04/19/2011  . Polyarthralgia 09/18/2010  . STRESS INCONTINENCE 04/25/2010  . ASTHMA 04/11/2010  . VERTIGO 04/11/2010  . Hypothyroidism 01/19/2010  . Hyperlipidemia associated with type 2 diabetes mellitus ([place]) 01/19/2010  . Essential  hypertension 01/19/2010  . GERD 01/19/2010  . PEPTIC ULCER DISEASE 01/19/2010  . OSTEOARTHRITIS 01/19/2010  . LOW BACK PAIN 01/19/2010    Evalise Abruzzese 10/22/2016, 1:31 PM  [place], [place], [address] Phone: [phone]   Fax:  [phone]  Name: Tracey Evans MRN: [id number] Date of Birth: 1945-07-02  PHYSICAL THERAPY DISCHARGE SUMMARY  Visits from Start of Care: 8  Current functional level related to goals / functional outcomes: Goals partially met as noted above. Abdominal circumference measurement reduction goal not met at level listed for goals, but signficant reductions were seen at two other levels where measured.    Remaining deficits: Still with swelling in abdomen that will require management. Pt. has not yet worn compression consistently.   Education / Equipment: Self-manual lymph drainage. Plan: Patient agrees to discharge.  Patient goals were partially met. Patient is being discharged due to                                                     ?????She feels therapy has helped as much as it can help her.  She can continue to manage independently at home.  Serafina Royals, PT 10/22/16 1:34 PM

## 2016-10-25 ENCOUNTER — Ambulatory Visit: Payer: Self-pay | Admitting: General Surgery

## 2016-10-30 ENCOUNTER — Encounter (HOSPITAL_BASED_OUTPATIENT_CLINIC_OR_DEPARTMENT_OTHER): Payer: Self-pay | Admitting: *Deleted

## 2016-10-30 NOTE — Progress Notes (Signed)
To [facility] at 0700- [place],,Ekg on arrival Npo after [place]-Hibiclens shower evening prior and am of procedure-use pro air inhaler and bring-norvasc,levothyroxine,zantac with small amt water in am.

## 2016-11-01 DIAGNOSIS — M1711 Unilateral primary osteoarthritis, right knee: Secondary | ICD-10-CM | POA: Diagnosis not present

## 2016-11-01 DIAGNOSIS — G8929 Other chronic pain: Secondary | ICD-10-CM | POA: Diagnosis not present

## 2016-11-01 DIAGNOSIS — M25562 Pain in left knee: Secondary | ICD-10-CM | POA: Diagnosis not present

## 2016-11-09 ENCOUNTER — Ambulatory Visit (HOSPITAL_BASED_OUTPATIENT_CLINIC_OR_DEPARTMENT_OTHER): Payer: Medicare Other | Admitting: Adult Health

## 2016-11-09 VITALS — BP 123/80 | HR 76 | Temp 98.2°F | Resp 17 | Ht 65.0 in | Wt 183.5 lb

## 2016-11-09 DIAGNOSIS — Z170 Estrogen receptor positive status [ER+]: Secondary | ICD-10-CM | POA: Diagnosis not present

## 2016-11-09 DIAGNOSIS — C50511 Malignant neoplasm of lower-outer quadrant of right female breast: Secondary | ICD-10-CM | POA: Diagnosis not present

## 2016-11-09 DIAGNOSIS — Z79811 Long term (current) use of aromatase inhibitors: Secondary | ICD-10-CM

## 2016-11-09 NOTE — Progress Notes (Signed)
CLINIC:  Survivorship   REASON FOR VISIT:  Routine follow-up post-treatment for a recent history of breast cancer.  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, heterozygous      08/15/2015 Surgery    Right mastectomy: IDC grade 2, 2.2 cm, with associated DCIS intermediate grade, separate focus high-grade DCIS, 0/4 lymph nodes negative, T2 N0 stage II a, ER 90%, PR 5%, HER-2 negative duration 1.42, Ki-67 60%      09/22/2015 - 01/05/2016 Chemotherapy    Adjuvant chemotherapy with Bracey 5 cycles followed by Herceptin maintenance for 1 year       INTERVAL HISTORY:  Tracey Evans presents to the [facility] today for our initial meeting to review her survivorship care plan detailing her treatment course for breast cancer, as well as monitoring long-term side effects of that treatment, education regarding health maintenance, screening, and overall wellness and health promotion.     Overall, Tracey Evans reports doing well today.    She is experiencing peripheral neuropathy in her fingertips and her toes.  It is now in her toes, up to midway up the sole of her feet.  She cannot feel the tips of her fingers.  She is no longer able to crochet due to the numbness in her fingertips.  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  The neuropathy does continue to improve.   She tells me that now that her diagnosis is over, she is depressed.  Her hair hasn't grown out/and it is curly.  She says she looks in the mirror and sees a shell of a person  that once was.  She has abdominal swelling and has undergone treatment and testing and physical therapy and no one can get it to go away.  She is embarrassed to live the way she used to because it pokes out badly and she does not want people to see it.    REVIEW OF SYSTEMS:  Review of Systems  Constitutional: Positive for fatigue. Negative for appetite change, chills, fever and unexpected weight change.  HENT:   Negative for hearing loss and lump/mass.   Eyes: Negative for eye problems and icterus.  Respiratory: Negative for chest tightness, cough, shortness of breath and wheezing.   Cardiovascular: Negative for chest pain, leg swelling and palpitations.  Gastrointestinal: Negative for abdominal distention, abdominal pain, constipation, diarrhea, nausea and vomiting.  Endocrine: Negative for hot flashes.  Genitourinary: Negative for difficulty urinating.   Musculoskeletal: Negative for arthralgias.  Skin: Negative for itching and rash.  Neurological: Negative for dizziness, extremity weakness, headaches and numbness.  Psychiatric/Behavioral: Positive for depression.   Breast: Denies any new nodularity, masses, tenderness, nipple changes, or nipple discharge.      ONCOLOGY TREATMENT TEAM:  1. Surgeon:  Dr. Dalbert Batman  at [facility] 2. Medical Oncologist: Dr. Lindi Adie  3. Radiation Oncologist: Dr. Lisbeth Renshaw    PAST MEDICAL/SURGICAL HISTORY:  Past Medical History:  Diagnosis Date  . Angina at rest [facility])    "occurs whenever it wants to, but worse during agitation"  .  Anxiety   . Asthma   . Binge eating disorder   . Breast cancer of lower-outer quadrant of left female breast ([place]) 07/08/2015   "NEVER HAD LEFT BREAST CANCER" (08/15/2015)  . Cancer of right breast ([place]) 08/15/2015  . Chronic bronchitis ([place])   . Chronic lower back pain   . Complication of anesthesia    had bronc spasms during intubation surgery 2009 on foot-need albuterol inhaler or neb tx preop  . Costochondritis    . Depression   . Diverticular disease   . Fibromyalgia   . GERD (gastroesophageal reflux disease)   . Hot flashes   . Hyperlipidemia   . Hypertension   . Hypothyroidism   . Osteoarthritis    "all over"  . Peptic ulcer disease   . Personal history of chemotherapy   . Shortness of breath dyspnea   . Type II diabetes mellitus ([place])    "diet controlled" (08/15/2015)   Past Surgical History:  Procedure Laterality Date  . ABDOMINAL HYSTERECTOMY     "left my ovaries"  . ANKLE ARTHROSCOPY  01/02/2012   Procedure: ANKLE ARTHROSCOPY;  Surgeon: Colin Rhein, MD;  Location: [place];  Service: Orthopedics;  Laterality: Right;  right ankle arthroscopy with extensive debridement , dridement and drilling talar dome osteochondral lesion  . ANKLE FRACTURE SURGERY Right 2009   Dr. Rolena Infante  . BREAST BIOPSY  07/2015  . CARDIAC CATHETERIZATION    . CARPAL TUNNEL RELEASE Bilateral   . COLONOSCOPY    . DILATION AND CURETTAGE OF UTERUS     "before hysterectomy"  . FRACTURE SURGERY    . KNEE ARTHROSCOPY Bilateral   . LAPAROSCOPIC CHOLECYSTECTOMY    . MASTECTOMY COMPLETE / SIMPLE W/ SENTINEL NODE BIOPSY Right 08/15/2015  . MASTECTOMY W/ SENTINEL NODE BIOPSY Right 08/15/2015   Procedure: TOTAL MASTECTOMY WITH SENTINEL LYMPH NODE BIOPSY AND BLUE DYE INJECTION ;  Surgeon: Fanny Skates, MD;  Location: [place];  Service: General;  Laterality: Right;  . PORTACATH PLACEMENT Left 08/15/2015  . PORTACATH PLACEMENT Left 08/15/2015   Procedure: INSERTION PORT-A-CATH ;  Surgeon: Fanny Skates, MD;  Location: [place];  Service: General;  Laterality: Left;  . SHOULDER ARTHROSCOPY Bilateral 2009-2011   "bone spurs"  . TONSILLECTOMY       ALLERGIES:  Allergies  Allergen Reactions  . Contrast Media [Iodinated Diagnostic Agents] Shortness Of Breath    Patient has SOB, tightness in chest and feels like an elephant is sitting on chest, patient should be  scanned at hospital Per Dr. Alvester Chou  . Dilaudid  [Hydromorphone Hcl] Anaphylaxis    Pt stopped breathing  . Adhesive [Tape]     blister  . Codeine Nausea Only    insomnia  . Lactose Intolerance (Gi) Diarrhea     CURRENT MEDICATIONS:  Outpatient Encounter Prescriptions as of 11/09/2016  Medication Sig  . ALPRAZolam (XANAX) 1 MG tablet TAKE 1 TABLET BY MOUTH TWICE A DAY AS NEEDED  . amLODipine (NORVASC) 2.5 MG tablet Take 2.5 mg by mouth daily.  Marland Kitchen aspirin 81 MG tablet Take 81 mg by mouth daily.  Marland Kitchen atorvastatin (LIPITOR) 20 MG tablet Take 1 tablet (20 mg total) by mouth every evening.  Marland Kitchen buPROPion (WELLBUTRIN XL) 300 MG 24 hr tablet TAKE 1 TABLET BY MOUTH EVERY DAY  . diphenhydrAMINE (BENADRYL) 50 MG tablet Take one tablet (56ms total) one hour prior to study.  .Marland Kitchenletrozole (FEMARA) 2.5 MG tablet Take 1 tablet (2.5 mg total) by mouth daily.  .Marland Kitchen  levothyroxine (SYNTHROID, LEVOTHROID) 75 MCG tablet TAKE 1 TABLET (75 MCG TOTAL) BY MOUTH DAILY.  Marland Kitchen lidocaine-prilocaine (EMLA) cream Apply to affected area once  . meloxicam (MOBIC) 15 MG tablet Take 15 mg by mouth daily.   . nitroGLYCERIN (NITROSTAT) 0.4 MG SL tablet Place 1 tablet (0.4 mg total) under the tongue every 5 (five) minutes as needed. As needed for chest pain  . ondansetron (ZOFRAN ODT) 4 MG disintegrating tablet Take 1 tablet (4 mg total) by mouth every 8 (eight) hours as needed for nausea or vomiting.  Vladimir Faster Glycol-Propyl Glycol (SYSTANE ULTRA) 0.4-0.3 % SOLN Place 1 drop into both eyes 3 (three) times daily as needed (for dry eyes).   Marland Kitchen PROAIR HFA 108 (90 Base) MCG/ACT inhaler INHALE 2 PUFFS INTO THE LUNGS EVERY 6 (SIX) HOURS AS NEEDED FOR WHEEZING.  Marland Kitchen prochlorperazine (COMPAZINE) 10 MG tablet Take 1 tablet (10 mg total) by mouth every 6 (six) hours as needed (Nausea or vomiting).  . ranitidine (ZANTAC) 150 MG tablet Take 150 mg by mouth 2 (two) times daily as needed for heartburn. Reported on 10/05/2015  . tiZANidine (ZANAFLEX) 4 MG tablet TAKE 1 TABLET BY MOUTH EVERY 8 HOURS AS  NEEDED FOR SPASM  . [DISCONTINUED] amLODipine-valsartan (EXFORGE) 5-160 MG tablet TAKE 1 TABLET BY MOUTH DAILY.  . [DISCONTINUED] furosemide (LASIX) 20 MG tablet Take 1 tablet (20 mg total) by mouth daily.  . [DISCONTINUED] HYDROcodone-acetaminophen (NORCO/VICODIN) 5-325 MG tablet Take 1 tablet by mouth 3 (three) times daily as needed.  . [DISCONTINUED] magic mouthwash w/lidocaine SOLN 59m Swish,Swallow, or spit 4 times a day as needed  . [DISCONTINUED] predniSONE (DELTASONE) 50 MG tablet Take one tablet (523m) total 13 hours, 7 hours and 1 hour prior to CT study  . [DISCONTINUED] traMADol (ULTRAM) 50 MG tablet Take 50 mg by mouth daily as needed for moderate pain. Reported on 10/05/2015   Facility-Administered Encounter Medications as of 11/09/2016  Medication  . sodium chloride flush (NS) 0.9 % injection 10 mL     ONCOLOGIC FAMILY HISTORY:  Family History  Problem Relation Age of Onset  . Dementia Mother   . Stroke Mother   . Heart Problems Mother   . Dementia Father   . Alcohol abuse Father   . Colon cancer Father        dx. 7424r younger  . Breast cancer Sister 5616     inflammatory  . Diverticulitis Sister        maternal half-sister; severe - causing partial colectomy  . Breast cancer Sister        maternal half-sister; dx. early 8025s. Breast cancer Other 4148     niece  . Breast cancer Other        niece dx. 5010s. Alcohol abuse Brother      SOCIAL HISTORY:  EiLETTIE CZARNECKIs single [place] currently retired.  She denies any current or history of tobacco, alcohol, or illicit drug use.     PHYSICAL EXAMINATION:  Vital Signs:   Vitals:   11/09/16 1253  BP: 123/80  Pulse: 76  Resp: 17  Temp: 98.2 F (36.8 C)   Filed Weights   11/09/16 1253  Weight: 183 lb 8 oz (83.2 kg)   General: Well-nourished, well-appearing female in no acute distress.  She is unaccompanied today.   HEENT: Head is  normocephalic.  Pupils equal and reactive to light.  Conjunctivae clear without exudate.  Sclerae anicteric. Oral mucosa is pink, moist.  Oropharynx is pink without lesions or erythema.  Lymph: No cervical, supraclavicular, or infraclavicular lymphadenopathy noted on palpation.  Cardiovascular: Regular rate and rhythm.Marland Kitchen Respiratory: Clear to auscultation bilaterally. Chest expansion symmetric; breathing non-labored.  GI: Abdomen soft and round; non-tender, non-distended. Bowel sounds normoactive.  GU: Deferred.  Neuro: No focal deficits. Steady gait.  Psych: Mood and affect normal and appropriate for situation.  Extremities: No edema. MSK: No focal spinal tenderness to palpation.  Full range of motion in bilateral upper extremities Skin: Warm and dry.  LABORATORY DATA:  None for this visit.  DIAGNOSTIC IMAGING:  None for this visit.      ASSESSMENT AND PLAN:  Ms.. Evans is a pleasant 70 y.o. female with Stage IIA right breast invasive ductal carcinoma, ER+/PR+/HER2-, diagnosed in 06/2015, treated with Mastectomy, adjuvant radiation therapy, adjuvant chemotherapy, and anti-estrogen therapy with Letrozole beginning in 07/2016.  She presents to the [facility]orship Clinic for our initial meeting and routine follow-up post-completion of treatment for breast cancer.    1. Stage IIA right breast cancer:  Tracey Evans is continuing to recover from definitive treatment for breast cancer. She will follow-up with her medical oncologist, Dr. Lindi Adie in a few weeks with history and physical exam per surveillance protocol.  She will continue her anti-estrogen therapy with Letrozole. Thus far, she is tolerating the Letrozole well, with minimal side effects. She was instructed to make Dr. Lindi Adie or myself aware if she begins to experie Today, a comprehensive survivorship care plan and treatment summary was reviewed with the patient today detailing her breast cancer diagnosis, treatment course, potential  late/long-term effects of treatment, appropriate follow-up care with recommendations for the future, and patient education resources.  A copy of this summary, along with a letter will be sent to the patient's primary care provider via mail/fax/In Basket message after today's visit.    2. Abdominal swelling: Tracey is very dissatisfied that she cannot get any answers as to what this is and how to improve it.  She is very frustrated.  I suggested that she perhaps seek a second opinion at an academic medical center.    3. Hair loss: I counseled her that sometimes it is quite normal for hair to grow in curly rather than straight.  I did inform her that it may not be as thick as it once was.  She declined to take any vitamins and says she doesn't believe in taking supplements.    4.  Peripheral neuropathy: This is a long lasting problem for Tracey Evans.  I offered validation, and am hopeful that since it is slowly improving and now she can type better than she previously could, that it would continue to slowly improve.    5. Bone health:  Given Tracey Evans's age/history of breast cancer and her current treatment regimen including anti-estrogen therapy with Letrozole, she is at risk for bone demineralization.  Her last DEXA scan was a few years ago and was normal.  She and I discussed that since she just started Letrozole, we could wait until her next mammogram is due to order the bone density, as repeating it now will likely yield normal results.  In the meantime, she was encouraged to increase her consumption of foods rich in calcium, as well as increase her weight-bearing activities.  She was given education on specific activities to promote bone health.  6. Cancer screening:  Due to Tracey Evans's history and her age,  she should receive screening for skin cancers, colon cancer, and gynecologic cancers.  The information and recommendations are listed on the patient's comprehensive care plan/treatment summary  and were reviewed in detail with the patient.    7. Health maintenance and wellness promotion: Tracey Evans was encouraged to consume 5-7 servings of fruits and vegetables per day. We reviewed the "Nutrition Rainbow" handout, as well as the handout "Take Control of Your Health and Reduce Your Cancer Risk" from the [facility].  She was also encouraged to engage in moderate to vigorous exercise for 30 minutes per day most days of the week. We discussed the [employer] fitness program, which is designed for cancer survivors to help them become more physically fit after cancer treatments.  She was instructed to limit her alcohol consumption and continue to abstain from tobacco use.     8. Support services/counseling: It is not uncommon for this period of the patient's cancer care trajectory to be one of many emotions and stressors.  We discussed an opportunity for her to participate in the next session of [facility] ("Finding Your New Normal") support group series designed for patients after they have completed treatment.  I really encouraged this class and that she take it because, it sounds like she is having a really difficult time with finding her new normal.   Tracey Evans was encouraged to take advantage of our many other support services programs, support groups, and/or counseling in coping with her new life as a cancer survivor after completing anti-cancer treatment.  She was offered support today through active listening and expressive supportive counseling.  She was given information regarding our available services and encouraged to contact me with any questions or for help enrolling in any of our support group/programs.    Dispo:   -Return to cancer center for follow up with Dr. Lindi Adie 11/30/16 -Mammogram due in 07/2017 -Bone Density in 07/2017 -Follow up with surgery this month -She is welcome to return back to the Survivorship Clinic at any time; no additional follow-up needed at this  time.  -Consider referral back to survivorship as a long-term survivor for continued surveillance  A total of (60) minutes of face-to-face time was spent with this patient with greater than 50% of that time in counseling and care-coordination.   Gardenia Phlegm, [place] [phone]   Note: PRIMARY CARE PROVIDER Midge Minium, [place] [phone]

## 2016-11-10 ENCOUNTER — Encounter: Payer: Self-pay | Admitting: Adult Health

## 2016-11-12 ENCOUNTER — Ambulatory Visit (HOSPITAL_BASED_OUTPATIENT_CLINIC_OR_DEPARTMENT_OTHER): Payer: Medicare Other | Admitting: Anesthesiology

## 2016-11-12 ENCOUNTER — Ambulatory Visit (HOSPITAL_BASED_OUTPATIENT_CLINIC_OR_DEPARTMENT_OTHER)
Admission: RE | Admit: 2016-11-12 | Discharge: 2016-11-12 | Disposition: A | Discharge status: Home / Self Care | Payer: Medicare Other | Source: Ambulatory Visit | Attending: General Surgery | Admitting: General Surgery

## 2016-11-12 ENCOUNTER — Encounter (HOSPITAL_BASED_OUTPATIENT_CLINIC_OR_DEPARTMENT_OTHER)
Admission: RE | Disposition: A | Discharge status: Home / Self Care | Payer: Self-pay | Source: Ambulatory Visit | Attending: General Surgery

## 2016-11-12 ENCOUNTER — Encounter (HOSPITAL_BASED_OUTPATIENT_CLINIC_OR_DEPARTMENT_OTHER): Payer: Self-pay

## 2016-11-12 DIAGNOSIS — Z888 Allergy status to other drugs, medicaments and biological substances status: Secondary | ICD-10-CM | POA: Insufficient documentation

## 2016-11-12 DIAGNOSIS — M1990 Unspecified osteoarthritis, unspecified site: Secondary | ICD-10-CM | POA: Diagnosis not present

## 2016-11-12 DIAGNOSIS — Z8719 Personal history of other diseases of the digestive system: Secondary | ICD-10-CM | POA: Insufficient documentation

## 2016-11-12 DIAGNOSIS — Z91041 Radiographic dye allergy status: Secondary | ICD-10-CM | POA: Insufficient documentation

## 2016-11-12 DIAGNOSIS — E739 Lactose intolerance, unspecified: Secondary | ICD-10-CM | POA: Diagnosis not present

## 2016-11-12 DIAGNOSIS — E119 Type 2 diabetes mellitus without complications: Secondary | ICD-10-CM | POA: Diagnosis not present

## 2016-11-12 DIAGNOSIS — M797 Fibromyalgia: Secondary | ICD-10-CM | POA: Diagnosis not present

## 2016-11-12 DIAGNOSIS — Z90710 Acquired absence of both cervix and uterus: Secondary | ICD-10-CM | POA: Diagnosis not present

## 2016-11-12 DIAGNOSIS — J45909 Unspecified asthma, uncomplicated: Secondary | ICD-10-CM | POA: Insufficient documentation

## 2016-11-12 DIAGNOSIS — Z79899 Other long term (current) drug therapy: Secondary | ICD-10-CM | POA: Diagnosis not present

## 2016-11-12 DIAGNOSIS — C50511 Malignant neoplasm of lower-outer quadrant of right female breast: Secondary | ICD-10-CM | POA: Diagnosis not present

## 2016-11-12 DIAGNOSIS — Z853 Personal history of malignant neoplasm of breast: Secondary | ICD-10-CM | POA: Diagnosis not present

## 2016-11-12 DIAGNOSIS — E039 Hypothyroidism, unspecified: Secondary | ICD-10-CM | POA: Diagnosis not present

## 2016-11-12 DIAGNOSIS — F419 Anxiety disorder, unspecified: Secondary | ICD-10-CM | POA: Insufficient documentation

## 2016-11-12 DIAGNOSIS — K219 Gastro-esophageal reflux disease without esophagitis: Secondary | ICD-10-CM | POA: Diagnosis not present

## 2016-11-12 DIAGNOSIS — Z7982 Long term (current) use of aspirin: Secondary | ICD-10-CM | POA: Insufficient documentation

## 2016-11-12 DIAGNOSIS — F329 Major depressive disorder, single episode, unspecified: Secondary | ICD-10-CM | POA: Insufficient documentation

## 2016-11-12 DIAGNOSIS — I10 Essential (primary) hypertension: Secondary | ICD-10-CM | POA: Insufficient documentation

## 2016-11-12 DIAGNOSIS — Z452 Encounter for adjustment and management of vascular access device: Secondary | ICD-10-CM | POA: Diagnosis not present

## 2016-11-12 DIAGNOSIS — H04123 Dry eye syndrome of bilateral lacrimal glands: Secondary | ICD-10-CM | POA: Insufficient documentation

## 2016-11-12 DIAGNOSIS — Z8711 Personal history of peptic ulcer disease: Secondary | ICD-10-CM | POA: Diagnosis not present

## 2016-11-12 DIAGNOSIS — E785 Hyperlipidemia, unspecified: Secondary | ICD-10-CM | POA: Diagnosis not present

## 2016-11-12 DIAGNOSIS — Z885 Allergy status to narcotic agent status: Secondary | ICD-10-CM | POA: Diagnosis not present

## 2016-11-12 HISTORY — PX: PORT-A-CATH REMOVAL: SHX5289

## 2016-11-12 LAB — POCT I-STAT 4, (NA,K, GLUC, HGB,HCT)
GLUCOSE: 103 mg/dL — AB (ref 65–99)
HCT: 37 % (ref 36.0–46.0)
HEMOGLOBIN: 12.6 g/dL (ref 12.0–15.0)
POTASSIUM: 3.6 mmol/L (ref 3.5–5.1)
Sodium: 145 mmol/L (ref 135–145)

## 2016-11-12 LAB — GLUCOSE, CAPILLARY: GLUCOSE-CAPILLARY: 94 mg/dL (ref 65–99)

## 2016-11-12 SURGERY — REMOVAL PORT-A-CATH
Anesthesia: Monitor Anesthesia Care | Site: Chest

## 2016-11-12 MED ORDER — SODIUM CHLORIDE 0.9% FLUSH: 3.0000 mL | INTRAVENOUS | Status: DC | PRN

## 2016-11-12 MED ORDER — LIDOCAINE HCL (CARDIAC) 20 MG/ML IV SOLN
INTRAVENOUS | Status: DC | PRN
  Administered 2016-11-12: 40 mg via INTRAVENOUS

## 2016-11-12 MED ORDER — BUPIVACAINE HCL (PF) 0.25 % IJ SOLN
INTRAMUSCULAR | Status: AC
  Filled 2016-11-12: qty 30

## 2016-11-12 MED ORDER — BUPIVACAINE HCL (PF) 0.25 % IJ SOLN
INTRAMUSCULAR | Status: DC | PRN
  Administered 2016-11-12: 3 mL

## 2016-11-12 MED ORDER — ACETAMINOPHEN 650 MG RE SUPP: 650.0000 mg | RECTAL | Status: DC | PRN

## 2016-11-12 MED ORDER — CHLORHEXIDINE GLUCONATE CLOTH 2 % EX PADS
6.0000 | MEDICATED_PAD | Freq: Once | CUTANEOUS | Status: DC
  Filled 2016-11-12: qty 6

## 2016-11-12 MED ORDER — LIDOCAINE-EPINEPHRINE (PF) 1 %-1:200000 IJ SOLN
INTRAMUSCULAR | Status: DC | PRN
  Administered 2016-11-12: 3 mL

## 2016-11-12 MED ORDER — LIDOCAINE-EPINEPHRINE (PF) 1 %-1:200000 IJ SOLN
INTRAMUSCULAR | Status: AC
  Filled 2016-11-12: qty 30

## 2016-11-12 MED ORDER — LIDOCAINE 2% (20 MG/ML) 5 ML SYRINGE
INTRAMUSCULAR | Status: AC
  Filled 2016-11-12: qty 5

## 2016-11-12 MED ORDER — PROPOFOL 500 MG/50ML IV EMUL
INTRAVENOUS | Status: DC | PRN
  Administered 2016-11-12: 50 ug/kg/min via INTRAVENOUS

## 2016-11-12 MED ORDER — DEXAMETHASONE SODIUM PHOSPHATE 10 MG/ML IJ SOLN
INTRAMUSCULAR | Status: AC
  Filled 2016-11-12: qty 1

## 2016-11-12 MED ORDER — OXYCODONE HCL 5 MG PO TABS: 5.0000 mg | ORAL_TABLET | ORAL | Status: DC | PRN

## 2016-11-12 MED ORDER — SODIUM BICARBONATE 4 % IV SOLN
INTRAVENOUS | Status: DC | PRN
  Administered 2016-11-12: 1 mL

## 2016-11-12 MED ORDER — ONDANSETRON HCL 4 MG/2ML IJ SOLN
INTRAMUSCULAR | Status: AC
  Filled 2016-11-12: qty 2

## 2016-11-12 MED ORDER — FENTANYL CITRATE (PF) 100 MCG/2ML IJ SOLN
INTRAMUSCULAR | Status: DC | PRN
  Administered 2016-11-12: 25 ug via INTRAVENOUS

## 2016-11-12 MED ORDER — ACETAMINOPHEN 325 MG PO TABS: 650.0000 mg | ORAL_TABLET | ORAL | Status: DC | PRN

## 2016-11-12 MED ORDER — BUPIVACAINE-EPINEPHRINE (PF) 0.5% -1:200000 IJ SOLN
INTRAMUSCULAR | Status: AC
  Filled 2016-11-12: qty 30

## 2016-11-12 MED ORDER — PROPOFOL 500 MG/50ML IV EMUL
INTRAVENOUS | Status: AC
  Filled 2016-11-12: qty 50

## 2016-11-12 MED ORDER — FENTANYL CITRATE (PF) 100 MCG/2ML IJ SOLN: 25.0000 ug | INTRAMUSCULAR | Status: DC | PRN

## 2016-11-12 MED ORDER — LACTATED RINGERS IV SOLN
INTRAVENOUS | Status: DC
  Administered 2016-11-12: 08:00:00 via INTRAVENOUS
  Filled 2016-11-12: qty 1000

## 2016-11-12 MED ORDER — PROMETHAZINE HCL 25 MG/ML IJ SOLN: 6.2500 mg | INTRAMUSCULAR | Status: DC | PRN

## 2016-11-12 MED ORDER — FENTANYL CITRATE (PF) 100 MCG/2ML IJ SOLN
INTRAMUSCULAR | Status: AC
  Filled 2016-11-12: qty 2

## 2016-11-12 MED ORDER — SODIUM BICARBONATE 4 % IV SOLN
INTRAVENOUS | Status: AC
  Filled 2016-11-12: qty 5

## 2016-11-12 SURGICAL SUPPLY — 36 items
BENZOIN TINCTURE PRP APPL 2/3 (GAUZE/BANDAGES/DRESSINGS) ×3 IMPLANT
BLADE SURG 15 STRL LF DISP TIS (BLADE) ×1 IMPLANT
BLADE SURG 15 STRL SS (BLADE) ×3
CLEANER CAUTERY TIP 5X5 PAD (MISCELLANEOUS) ×1 IMPLANT
CLOSURE WOUND 1/2 X4 (GAUZE/BANDAGES/DRESSINGS) ×1
CLOTH BEACON ORANGE TIMEOUT ST (SAFETY) ×3 IMPLANT
COVER BACK TABLE 60X90IN (DRAPES) ×3 IMPLANT
COVER MAYO STAND STRL (DRAPES) ×3 IMPLANT
DRAPE LAPAROTOMY 100X72 PEDS (DRAPES) ×3 IMPLANT
DRAPE UTILITY XL STRL (DRAPES) ×3 IMPLANT
DRSG OPSITE 11X17.75 LRG (GAUZE/BANDAGES/DRESSINGS) IMPLANT
DRSG TEGADERM 4X4.75 (GAUZE/BANDAGES/DRESSINGS) ×3 IMPLANT
DRSG TELFA 3X8 NADH (GAUZE/BANDAGES/DRESSINGS) ×3 IMPLANT
ELECT REM PT RETURN 9FT ADLT (ELECTROSURGICAL) ×3
ELECTRODE REM PT RTRN 9FT ADLT (ELECTROSURGICAL) ×1 IMPLANT
GAUZE SPONGE 4X4 12PLY STRL LF (GAUZE/BANDAGES/DRESSINGS) IMPLANT
GLOVE BIO SURGEON STRL SZ8 (GLOVE) ×3 IMPLANT
GOWN W/2 COTTON TOWELS 2 STD (GOWNS) IMPLANT
KIT RM TURNOVER CYSTO AR (KITS) ×3 IMPLANT
MANIFOLD NEPTUNE II (INSTRUMENTS) IMPLANT
NEEDLE HYPO 25X1 1.5 SAFETY (NEEDLE) ×3 IMPLANT
NS IRRIG 500ML POUR BTL (IV SOLUTION) ×3 IMPLANT
PACK BASIN DAY SURGERY FS (CUSTOM PROCEDURE TRAY) ×3 IMPLANT
PAD CLEANER CAUTERY TIP 5X5 (MISCELLANEOUS) ×2
PENCIL BUTTON HOLSTER BLD 10FT (ELECTRODE) ×3 IMPLANT
STRIP CLOSURE SKIN 1/2X4 (GAUZE/BANDAGES/DRESSINGS) ×2 IMPLANT
SUT MON AB 4-0 PC3 18 (SUTURE) ×3 IMPLANT
SUT VIC AB 4-0 SH 27 (SUTURE) ×3
SUT VIC AB 4-0 SH 27XANBCTRL (SUTURE) ×1 IMPLANT
SYR BULB 3OZ (MISCELLANEOUS) ×3 IMPLANT
SYR CONTROL 10ML LL (SYRINGE) ×3 IMPLANT
TOWEL OR 17X24 6PK STRL BLUE (TOWEL DISPOSABLE) ×6 IMPLANT
TRAY DSU PREP LF (CUSTOM PROCEDURE TRAY) IMPLANT
TUBE CONNECTING 12'X1/4 (SUCTIONS)
TUBE CONNECTING 12X1/4 (SUCTIONS) IMPLANT
WATER STERILE IRR 500ML POUR (IV SOLUTION) IMPLANT

## 2016-11-12 NOTE — Transfer of Care (Signed)
Immediate Anesthesia Transfer of Care Note  Patient: Tracey Evans  Procedure(s) Performed: Procedure(s): REMOVAL PORT-A-CATH (N/A)  Patient Location: PACU  Anesthesia Type:MAC  Level of Consciousness: awake, alert  and oriented  Airway & Oxygen Therapy: Patient Spontanous Breathing  Post-op Assessment: Report given to RN  Post vital signs: Reviewed and stable  Last Vitals: 146/85, 76, 18, 96%  Vitals:   11/12/16 0705  BP: 123/82  Pulse: 79  Resp: 16  Temp: 36.6 C    Last Pain:  Vitals:   11/12/16 0705  TempSrc: Oral      Patients Stated Pain Goal: 9 (37/10/62 6948)  Complications: No apparent anesthesia complications

## 2016-11-12 NOTE — Anesthesia Procedure Notes (Signed)
Procedure Name: MAC Date/Time: 11/12/2016 8:40 AM Performed by: Bethena Roys T Pre-anesthesia Checklist: Patient identified, Timeout performed, Emergency Drugs available, Suction available and Patient being monitored Patient Re-evaluated:Patient Re-evaluated prior to induction Oxygen Delivery Method: Nasal cannula Placement Confirmation: positive ETCO2

## 2016-11-12 NOTE — Anesthesia Postprocedure Evaluation (Signed)
Anesthesia Post Note  Patient: Tracey Evans  Procedure(s) Performed: Procedure(s) (LRB): REMOVAL PORT-A-CATH (N/A)     Patient location during evaluation: PACU Anesthesia Type: MAC Level of consciousness: awake and alert Pain management: pain level controlled Vital Signs Assessment: post-procedure vital signs reviewed and stable Respiratory status: spontaneous breathing, nonlabored ventilation, respiratory function stable and patient connected to nasal cannula oxygen Cardiovascular status: stable and blood pressure returned to baseline Anesthetic complications: no    Last Vitals:  Vitals:   11/12/16 1000 11/12/16 1032  BP: 110/89 138/73  Pulse: 76 68  Resp: 12 16  Temp:  36.6 C    Last Pain:  Vitals:   11/12/16 1017  TempSrc:   PainSc: 0-No pain                 Dontay Harm,JAMES TERRILL

## 2016-11-12 NOTE — H&P (Signed)
Tracey Evans is an 71 y.o. female.   Chief Complaint:  Here for PAC removal HPI: This is a 71 year old female with breast cancer who has had an indwelling Port-A-Cath for her Herceptin infusions. She has finished her effusions and no longer needs the Port-A-Cath. She now presents for Port-A-Cath removal. She stopped her aspirin 5 days ago. She has allergy to adhesive tape in that it caused blisters but she reports that she had Steri-Strips in the past and tolerated those well.  Past Medical History:  Diagnosis Date  . Angina at rest [facility])    "occurs whenever it wants to, but worse during agitation"  . Anxiety   . Asthma   . Binge eating disorder   . Breast cancer of lower-outer quadrant of left female breast ([place]) 07/08/2015   "NEVER HAD LEFT BREAST CANCER" (08/15/2015)  . Cancer of right breast ([place]) 08/15/2015  . Chronic bronchitis ([place])   . Chronic lower back pain   . Complication of anesthesia    had bronc spasms during intubation surgery 2009 on foot-need albuterol inhaler or neb tx preop  . Costochondritis   . Depression   . Diverticular disease   . Fibromyalgia   . GERD (gastroesophageal reflux disease)   . Hot flashes   . Hyperlipidemia   . Hypertension   . Hypothyroidism   . Osteoarthritis    "all over"  . Peptic ulcer disease   . Personal history of chemotherapy   . Shortness of breath dyspnea   . Type II diabetes mellitus ([place])    "diet controlled" (08/15/2015)    Past Surgical History:  Procedure Laterality Date  . ABDOMINAL HYSTERECTOMY     "left my ovaries"  . ANKLE ARTHROSCOPY  01/02/2012   Procedure: ANKLE ARTHROSCOPY;  Surgeon: Colin Rhein, MD;  Location: [place];  Service: Orthopedics;  Laterality: Right;  right ankle arthroscopy with extensive debridement , dridement and drilling talar dome osteochondral lesion  . ANKLE FRACTURE SURGERY Right 2009   Dr. Rolena Infante  . BREAST BIOPSY  07/2015  . CARDIAC CATHETERIZATION    . CARPAL TUNNEL  RELEASE Bilateral   . COLONOSCOPY    . DILATION AND CURETTAGE OF UTERUS     "before hysterectomy"  . FRACTURE SURGERY    . KNEE ARTHROSCOPY Bilateral   . LAPAROSCOPIC CHOLECYSTECTOMY    . MASTECTOMY COMPLETE / SIMPLE W/ SENTINEL NODE BIOPSY Right 08/15/2015  . MASTECTOMY W/ SENTINEL NODE BIOPSY Right 08/15/2015   Procedure: TOTAL MASTECTOMY WITH SENTINEL LYMPH NODE BIOPSY AND BLUE DYE INJECTION ;  Surgeon: Fanny Skates, MD;  Location: [place];  Service: General;  Laterality: Right;  . PORTACATH PLACEMENT Left 08/15/2015  . PORTACATH PLACEMENT Left 08/15/2015   Procedure: INSERTION PORT-A-CATH ;  Surgeon: Fanny Skates, MD;  Location: [place];  Service: General;  Laterality: Left;  . SHOULDER ARTHROSCOPY Bilateral 2009-2011   "bone spurs"  . TONSILLECTOMY      Family History  Problem Relation Age of Onset  . Dementia Mother   . Stroke Mother   . Heart Problems Mother   . Dementia Father   . Alcohol abuse Father   . Colon cancer Father        dx. 19 or younger  . Breast cancer Sister 66       inflammatory  . Diverticulitis Sister        maternal half-sister; severe - causing partial colectomy  . Breast cancer Sister        maternal  half-sister; dx. early 29s  . Breast cancer Other 13       niece  . Breast cancer Other        niece dx. 63s  . Alcohol abuse Brother    Social History:  reports that she has never smoked. She has never used smokeless tobacco. She reports that she drinks alcohol. She reports that she does not use drugs.  Allergies:  Allergies  Allergen Reactions  . Contrast Media [Iodinated Diagnostic Agents] Shortness Of Breath    Patient has SOB, tightness in chest and feels like an elephant is sitting on chest, patient should be  scanned at hospital Per Dr. Alvester Chou  . Dilaudid [Hydromorphone Hcl] Anaphylaxis    Pt stopped breathing  . Adhesive [Tape]     blister  . Codeine Nausea Only    insomnia  . Lactose Intolerance (Gi) Diarrhea    Medications Prior to  Admission  Medication Sig Dispense Refill  . ALPRAZolam (XANAX) 1 MG tablet TAKE 1 TABLET BY MOUTH TWICE A DAY AS NEEDED 60 tablet 1  . amLODipine (NORVASC) 2.5 MG tablet Take 2.5 mg by mouth daily.  3  . aspirin 81 MG tablet Take 81 mg by mouth daily.    Marland Kitchen atorvastatin (LIPITOR) 20 MG tablet Take 1 tablet (20 mg total) by mouth every evening. 90 tablet 1  . buPROPion (WELLBUTRIN XL) 300 MG 24 hr tablet TAKE 1 TABLET BY MOUTH EVERY DAY 30 tablet 3  . letrozole (FEMARA) 2.5 MG tablet Take 1 tablet (2.5 mg total) by mouth daily. 90 tablet 3  . levothyroxine (SYNTHROID, LEVOTHROID) 75 MCG tablet TAKE 1 TABLET (75 MCG TOTAL) BY MOUTH DAILY. 30 tablet 6  . Polyethyl Glycol-Propyl Glycol (SYSTANE ULTRA) 0.4-0.3 % SOLN Place 1 drop into both eyes 3 (three) times daily as needed (for dry eyes).     Marland Kitchen PROAIR HFA 108 (90 Base) MCG/ACT inhaler INHALE 2 PUFFS INTO THE LUNGS EVERY 6 (SIX) HOURS AS NEEDED FOR WHEEZING. 8.5 Inhaler 0  . ranitidine (ZANTAC) 150 MG tablet Take 150 mg by mouth 2 (two) times daily as needed for heartburn. Reported on 10/05/2015    . diphenhydrAMINE (BENADRYL) 50 MG tablet Take one tablet (50mg s total) one hour prior to study. 1 tablet 0  . lidocaine-prilocaine (EMLA) cream Apply to affected area once 30 g 3  . meloxicam (MOBIC) 15 MG tablet Take 15 mg by mouth daily.     . nitroGLYCERIN (NITROSTAT) 0.4 MG SL tablet Place 1 tablet (0.4 mg total) under the tongue every 5 (five) minutes as needed. As needed for chest pain 30 tablet 3  . ondansetron (ZOFRAN ODT) 4 MG disintegrating tablet Take 1 tablet (4 mg total) by mouth every 8 (eight) hours as needed for nausea or vomiting. 20 tablet 0  . prochlorperazine (COMPAZINE) 10 MG tablet Take 1 tablet (10 mg total) by mouth every 6 (six) hours as needed (Nausea or vomiting). 90 tablet 0  . tiZANidine (ZANAFLEX) 4 MG tablet TAKE 1 TABLET BY MOUTH EVERY 8 HOURS AS NEEDED FOR SPASM  0    Results for orders placed or performed during the  hospital encounter of 11/12/16 (from the past 48 hour(s))  I-STAT 4, (NA,K, GLUC, HGB,HCT)     Status: Abnormal   Collection Time: 11/12/16  7:38 AM  Result Value Ref Range   Sodium 145 135 - 145 mmol/L   Potassium 3.6 3.5 - 5.1 mmol/L   Glucose, Bld 103 (H) 65 - 99 mg/dL  HCT 37.0 36.0 - 46.0 %   Hemoglobin 12.6 12.0 - 15.0 g/dL   No results found.  ROS  Blood pressure 123/82, pulse 79, temperature 97.8 F (36.6 C), temperature source Oral, resp. rate 16, height 5\' 5"  (1.651 m), weight 82.1 kg (181 lb), SpO2 100 %. Physical Exam  Constitutional: She appears well-developed and well-nourished. No distress.  HENT:  Head: Normocephalic and atraumatic.  Eyes: EOM are normal. No scleral icterus.  Respiratory:  Left upper chest wall scars and palpable PAC.  Neurological: She is alert.  Skin: Skin is warm and dry.  Psychiatric: She has a normal mood and affect. Her behavior is normal.     Assessment/Plan Indwelling Port-A-Cath. She has completed her Herceptin therapy.  Plan: Port-A-Cath removal. Procedure risks including but not limited to bleeding, infection, wound healing problems discussed with her.  Odis Hollingshead, MD 11/12/2016, 8:32 AM

## 2016-11-12 NOTE — Anesthesia Preprocedure Evaluation (Addendum)
Anesthesia Evaluation  Patient identified by MRN, date of birth, ID band Patient awake    Reviewed: Allergy & Precautions, NPO status , Patient's Chart, lab work & pertinent test results  Airway Mallampati: II  TM Distance: >3 FB Neck ROM: Full    Dental no notable dental hx.    Pulmonary shortness of breath, asthma ,    breath sounds clear to auscultation       Cardiovascular hypertension, + angina + Peripheral Vascular Disease   Rhythm:Regular Rate:Normal  ECHO normal   Neuro/Psych  Neuromuscular disease    GI/Hepatic PUD, GERD  ,  Endo/Other  diabetesBreast ca and Rx   Renal/GU      Musculoskeletal  (+) Arthritis , Fibromyalgia -  Abdominal   Peds  Hematology  (+) anemia ,   Anesthesia Other Findings   Reproductive/Obstetrics                            Anesthesia Physical Anesthesia Plan  ASA: III  Anesthesia Plan: MAC   Post-op Pain Management:    Induction: Intravenous  PONV Risk Score and Plan: 2 and Ondansetron and Dexamethasone  Airway Management Planned: Simple Face Mask and Natural Airway  Additional Equipment:   Intra-op Plan:   Post-operative Plan:   Informed Consent: I have reviewed the patients History and Physical, chart, labs and discussed the procedure including the risks, benefits and alternatives for the proposed anesthesia with the patient or authorized representative who has indicated his/her understanding and acceptance.     Plan Discussed with: CRNA  Anesthesia Plan Comments:         Anesthesia Quick Evaluation

## 2016-11-12 NOTE — Op Note (Signed)
OPERATIVE NOTE:  PORT-A-CATH REMOVAL  PREOPERATIVE DIAGNOSIS:  Retained Port-a-cath.  POSTOPERATIVE DIAGNOSIS:  Same  PROCEDURE:    Porta-cath removal  SURGEON:  Jackolyn Confer, M.D.  ANESTHESIA:  Local ( mixture of 1% Xylocaine, 0.5% Marcaine, and sodium bicarbonate) with MAC.  EBL:  Less than 50 cc.  INDICATION:  This is a 71 year old female with breast cancer who has received Herceptin via the Porta-cath.  The Porta-cath is no longer needed.  She now presents for removal.  The procedure, risks, and aftercare have been explained preoperatively.   The left upper chest wall and neck were sterilely prepped and draped. Local anesthetic was infiltrated at the site of the port in the chest wall superficially and deep. The previous scar was incised sharply and then using electrocautery the subcutaneous tissue was divided until the port and catheter were identified.  The fibrous sheath was dissected free from the catheter and it was pulled out of the subclavian vein. Direct pressure was held over the vein site for 10-15 minutes.  The port was then dissected free from the chest wall using electrocautery. The port and catheter were removed intact. The chest wall area was inspected and bleeding controlled with electrocautery. Once hemostasis was adequate, the chest wall incision was closed in 2 layers. The subcutaneous tissues approximated with running 3-0 Vicryl suture. The skin was closed with a running 4-0 Monocryl subcuticular stitch. Steri-Strips and a sterile dressing were applied.  She tolerated the procedure well without any apparent complications and was taken to the recovery room in satisfactory condition.

## 2016-11-12 NOTE — Interval H&P Note (Signed)
History and Physical Interval Note:  11/12/2016 8:35 AM  Tracey Evans  has presented today for surgery, with the diagnosis of breast cancer  The various methods of treatment have been discussed with the patient and family. After consideration of risks, benefits and other options for treatment, the patient has consented to  Procedure(s): REMOVAL PORT-A-CATH (N/A) as a surgical intervention .  The patient's history has been reviewed, patient examined, no change in status, stable for surgery.  I have reviewed the patient's chart and labs.  Questions were answered to the patient's satisfaction.     Creston Klas Lenna Sciara

## 2016-11-12 NOTE — Discharge Instructions (Addendum)
Remain upright for the next 4-6 hours.  May shower. Remove bandage in 3 days and leave Steri-Strips on. If bandage or Steri-Strips caused blistering, may remove them right away.  Light diet today.  Apply ice to the area for the next 2 days.  Call for heavy bleeding or other wound healing problems such as signs of infection.  Restart aspirin 11/15/2016.  Half Dr. Lindi Adie checked the Port-A-Cath site when you see him next. Post Anesthesia Home Care Instructions  Activity: Get plenty of rest for the remainder of the day. A responsible individual must stay with you for 24 hours following the procedure.  For the next 24 hours, DO NOT: -Drive a car -Paediatric nurse -Drink alcoholic beverages -Take any medication unless instructed by your physician -Make any legal decisions or sign important papers.  Meals: Start with liquid foods such as gelatin or soup. Progress to regular foods as tolerated. Avoid greasy, spicy, heavy foods. If nausea and/or vomiting occur, drink only clear liquids until the nausea and/or vomiting subsides. Call your physician if vomiting continues.  Special Instructions/Symptoms: Your throat may feel dry or sore from the anesthesia or the breathing tube placed in your throat during surgery. If this causes discomfort, gargle with warm salt water. The discomfort should disappear within 24 hours.  If you had a scopolamine patch placed behind your ear for the management of post- operative nausea and/or vomiting:  1. The medication in the patch is effective for 72 hours, after which it should be removed.  Wrap patch in a tissue and discard in the trash. Wash hands thoroughly with soap and water. 2. You may remove the patch earlier than 72 hours if you experience unpleasant side effects which may include dry mouth, dizziness or visual disturbances. 3. Avoid touching the patch. Wash your hands with soap and water after contact with the patch.

## 2016-11-13 ENCOUNTER — Encounter (HOSPITAL_BASED_OUTPATIENT_CLINIC_OR_DEPARTMENT_OTHER): Payer: Self-pay | Admitting: General Surgery

## 2016-11-13 DIAGNOSIS — M5134 Other intervertebral disc degeneration, thoracic region: Secondary | ICD-10-CM | POA: Diagnosis not present

## 2016-11-14 ENCOUNTER — Other Ambulatory Visit: Payer: Self-pay | Admitting: Family Medicine

## 2016-11-14 NOTE — Telephone Encounter (Signed)
Last OV 08/16/16 Alprazolam last filled 09/10/16 #60 with 1

## 2016-11-14 NOTE — Telephone Encounter (Signed)
Medication filled to pharmacy as requested.   

## 2016-11-19 ENCOUNTER — Encounter: Payer: Self-pay | Admitting: Pharmacist

## 2016-11-19 DIAGNOSIS — C50511 Malignant neoplasm of lower-outer quadrant of right female breast: Secondary | ICD-10-CM

## 2016-11-19 DIAGNOSIS — Z170 Estrogen receptor positive status [ER+]: Secondary | ICD-10-CM

## 2016-11-19 NOTE — Progress Notes (Signed)
Telephone documentation  Study code: [id number]  Spoke with patient over the phone today and reviewed with her the pharmacogenetic information listed below for the four genes of interest of the "Pharmacogenetic analysis of toxicities related to administration of taxanes in breast cancer patients" study. The information below was reviewed with the patient. Offered the patient the option of speaking with a [facility] genetic counselor and she was not interested in making an appointment. Provide my office number to the patient if she had additional questions.  **The buccal swab testing was NOT conducted at a CLIA validated lab. The patient was reminded that the information given was for informational proposes only and should NOT be used to make clinical decisions.   Gene Phenotype  CYP3A4 Normal metabolizer   CYP3A5 Normal metabolizer   SLCO1B1 Normal function  ABCB1 Intermediate function    Darl Pikes, PharmD, BCPS Hematology/Oncology Clinical Pharmacist [facility] [phone]

## 2016-11-28 ENCOUNTER — Encounter: Payer: Self-pay | Admitting: Family Medicine
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
## 2016-11-29 DIAGNOSIS — M150 Primary generalized (osteo)arthritis: Secondary | ICD-10-CM | POA: Diagnosis not present

## 2016-11-29 DIAGNOSIS — M797 Fibromyalgia: Secondary | ICD-10-CM | POA: Diagnosis not present

## 2016-11-29 DIAGNOSIS — M5136 Other intervertebral disc degeneration, lumbar region: Secondary | ICD-10-CM | POA: Diagnosis not present

## 2016-11-30 ENCOUNTER — Encounter: Payer: Self-pay | Admitting: General Practice

## 2016-11-30 ENCOUNTER — Encounter: Payer: Self-pay | Admitting: Hematology and Oncology

## 2016-11-30 ENCOUNTER — Encounter: Payer: Self-pay | Admitting: Family Medicine

## 2016-11-30 ENCOUNTER — Ambulatory Visit (INDEPENDENT_AMBULATORY_CARE_PROVIDER_SITE_OTHER): Payer: Medicare Other | Admitting: Family Medicine

## 2016-11-30 ENCOUNTER — Other Ambulatory Visit (HOSPITAL_BASED_OUTPATIENT_CLINIC_OR_DEPARTMENT_OTHER): Payer: Medicare Other

## 2016-11-30 ENCOUNTER — Telehealth: Payer: Self-pay

## 2016-11-30 ENCOUNTER — Ambulatory Visit (HOSPITAL_BASED_OUTPATIENT_CLINIC_OR_DEPARTMENT_OTHER): Payer: Medicare Other | Admitting: Hematology and Oncology

## 2016-11-30 VITALS — BP 132/82 | HR 74 | Temp 98.3°F | Resp 16 | Ht 65.0 in | Wt 191.0 lb

## 2016-11-30 DIAGNOSIS — Z170 Estrogen receptor positive status [ER+]: Secondary | ICD-10-CM | POA: Diagnosis not present

## 2016-11-30 DIAGNOSIS — Z79811 Long term (current) use of aromatase inhibitors: Secondary | ICD-10-CM

## 2016-11-30 DIAGNOSIS — F331 Major depressive disorder, recurrent, moderate: Secondary | ICD-10-CM

## 2016-11-30 DIAGNOSIS — E119 Type 2 diabetes mellitus without complications: Secondary | ICD-10-CM

## 2016-11-30 DIAGNOSIS — C50511 Malignant neoplasm of lower-outer quadrant of right female breast: Secondary | ICD-10-CM

## 2016-11-30 LAB — CBC WITH DIFFERENTIAL/PLATELET
BASO%: 0.7 % (ref 0.0–2.0)
BASOS ABS: 0.1 10*3/uL (ref 0.0–0.1)
EOS%: 5.6 % (ref 0.0–7.0)
Eosinophils Absolute: 0.5 10*3/uL (ref 0.0–0.5)
HCT: 39.1 % (ref 34.8–46.6)
HGB: 12.7 g/dL (ref 11.6–15.9)
LYMPH%: 39 % (ref 14.0–49.7)
MCH: 31.3 pg (ref 25.1–34.0)
MCHC: 32.6 g/dL (ref 31.5–36.0)
MCV: 96.1 fL (ref 79.5–101.0)
MONO#: 0.6 10*3/uL (ref 0.1–0.9)
MONO%: 6.8 % (ref 0.0–14.0)
NEUT%: 47.9 % (ref 38.4–76.8)
NEUTROS ABS: 3.9 10*3/uL (ref 1.5–6.5)
PLATELETS: 251 10*3/uL (ref 145–400)
RBC: 4.07 10*6/uL (ref 3.70–5.45)
RDW: 14.6 % — AB (ref 11.2–14.5)
WBC: 8.2 10*3/uL (ref 3.9–10.3)
lymph#: 3.2 10*3/uL (ref 0.9–3.3)

## 2016-11-30 LAB — COMPREHENSIVE METABOLIC PANEL
ALT: 21 U/L (ref 0–55)
ANION GAP: 9 meq/L (ref 3–11)
AST: 16 U/L (ref 5–34)
Albumin: 3.6 g/dL (ref 3.5–5.0)
Alkaline Phosphatase: 102 U/L (ref 40–150)
BUN: 17.3 mg/dL (ref 7.0–26.0)
CHLORIDE: 109 meq/L (ref 98–109)
CO2: 25 meq/L (ref 22–29)
CREATININE: 1.1 mg/dL (ref 0.6–1.1)
Calcium: 9.7 mg/dL (ref 8.4–10.4)
EGFR: 57 mL/min/{1.73_m2} — ABNORMAL LOW (ref >90)
GLUCOSE: 127 mg/dL (ref 70–140)
Potassium: 4.2 mEq/L (ref 3.5–5.1)
SODIUM: 143 meq/L (ref 136–145)
TOTAL PROTEIN: 6.7 g/dL (ref 6.4–8.3)
Total Bilirubin: 0.49 mg/dL (ref 0.20–1.20)

## 2016-11-30 LAB — HEMOGLOBIN A1C: Hgb A1c MFr Bld: 6.3 % (ref 4.6–6.5)

## 2016-11-30 MED ORDER — CITALOPRAM HYDROBROMIDE 10 MG PO TABS: 10.0000 mg | ORAL_TABLET | Freq: Every day | ORAL | 3 refills | Status: DC

## 2016-11-30 NOTE — Progress Notes (Signed)
Pre visit review using our clinic review tool, if applicable. No additional management support is needed unless otherwise documented below in the visit note. 

## 2016-11-30 NOTE — Assessment & Plan Note (Signed)
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 Adjuvant chemotherapy with [place] 2. Followed by adjuvant antiestrogen therapy with anastrozole 5 years No role of radiation since she had mastectomy. ----------------------------------------------------------------------------------------------------------------------- Prior treatment: Completed 5 cycles ofTCH, chemotherapy discontinued for neuropathy. Completed Herceptin maintenance 09/29/2006  Echocardiogram was reviewed 08/25/2015: EF 65-70%  Acute Diverticulitis: ED visit 02/22/16 Shingles: Resolved  Plan: 1.Anastrozole 1 mg daily startedin 03/15/16 discontinued 04/25/2016 because of pain in hands and feet, switched her to tamoxifen therapy. But she stopped taking tamoxifen as well. 3. Started letrozole 07/31/2016  CT abdomen and pelvis 07/30/2016: Normal study  Letrozole toxicities: Denies any hot flashes or myalgias. Tolerating it extremely well.  Return to clinic in one year for follow-up

## 2016-11-30 NOTE — Assessment & Plan Note (Signed)
Deteriorated w/ recent stressors.  Pt has had difficulty w/ various SSRIs/SNRIs in the past due to side effects.  Pt tolerated Celexa previously so will restart at a low dose (10mg ).  Will continue to follow closely.

## 2016-11-30 NOTE — Assessment & Plan Note (Signed)
Chronic problem.  Currently diet controlled but pt admits that she has been eating excessive amounts of ice cream due to the stress.  UTD on eye exam, foot exam.  On ACE for renal protection.  Stressed need for healthy diet and regular exercise.  Check labs.  Adjust meds prn

## 2016-11-30 NOTE — Progress Notes (Signed)
Patient Care Team: Midge Minium, MD as PCP - General [name], MD as Consulting Physician (Cardiology) Marylynn Pearson, MD as Consulting Physician (Ophthalmology) Nicholas Lose, MD as Consulting Physician (Hematology and Oncology) Kyung Rudd, MD as Consulting Physician (Radiation Oncology) Fanny Skates, MD as Consulting Physician (General Surgery) [place], [place] (Specialist) Suella Broad, MD as Consulting Physician (Physical Medicine and Rehabilitation) Delice Bison, Charlestine Massed, NP as Nurse Practitioner (Hematology and Oncology)  DIAGNOSIS:  Encounter Diagnosis  Name Primary?  . 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, heterozygous      08/15/2015 Surgery    Right mastectomy: IDC grade 2, 2.2 cm, with associated DCIS intermediate grade, separate focus high-grade DCIS, 0/4 lymph nodes negative, T2 N0 stage II a, ER 90%, PR 5%, HER-2 negative duration 1.42, Ki-67 60%      09/22/2015 - 01/05/2016 Chemotherapy    Adjuvant chemotherapy with [place] 5 cycles followed by Herceptin maintenance for 1 year      07/2016 -  Anti-estrogen oral therapy    Letrozole daily       CHIEF COMPLIANT: follow-up on letrozole  INTERVAL HISTORY: Tracey Evans is a 71 year old with above-mentioned history right breast cancer currently on letrozole therapy. She is tolerating letrozole fairly well. She has multiple other health issues including  diffuse body aches and pains related to fibromyalgia and osteoarthritis. She also has profound depression for which she eats ice cream all day in fact she eats 3 quarts of ice cream every day. She has not been exercising. Denies any pain lumps or nodules in the breasts.  REVIEW OF SYSTEMS:   Constitutional: Denies fevers, chills or abnormal weight loss Eyes: Denies blurriness of vision Ears, nose, mouth, throat, and face: Denies mucositis or sore throat Respiratory: Denies cough, dyspnea or wheezes Cardiovascular: Denies palpitation, chest discomfort Gastrointestinal:  Denies nausea, heartburn or change in bowel habits Skin: Denies abnormal skin rashes Lymphatics: Denies new lymphadenopathy or easy bruising Neurological:Denies numbness, tingling or new weaknesses Behavioral/Psych: complains of depression Extremities: No lower extremity edema Breast:  denies any pain or lumps or nodules in either breasts All other systems were reviewed with the patient and are negative.  I have reviewed the past medical history, past surgical history, social history and family history with the patient and they are unchanged from previous note.  ALLERGIES:  is allergic to contrast media [iodinated diagnostic agents]; dilaudid [hydromorphone hcl]; adhesive [tape]; codeine; and lactose intolerance (gi).  MEDICATIONS:  Current Outpatient Prescriptions  Medication Sig Dispense Refill  . ALPRAZolam (XANAX) 1 MG tablet TAKE 1 TABLET BY MOUTH TWICE A DAY AS NEEDED 60 tablet 1  . amLODipine (NORVASC) 2.5 MG tablet Take 2.5 mg by mouth daily.  3  . atorvastatin (LIPITOR) 20 MG tablet Take 1 tablet (20 mg total) by mouth every evening. 90 tablet 1  . buPROPion (WELLBUTRIN XL) 300 MG 24 hr tablet TAKE 1 TABLET BY MOUTH EVERY DAY 30 tablet 3  . diphenhydrAMINE (BENADRYL)  50 MG tablet Take one tablet (38ms total) one hour prior to study. 1 tablet 0  . letrozole (FEMARA) 2.5 MG tablet Take 1 tablet (2.5 mg total) by  mouth daily. 90 tablet 3  . levothyroxine (SYNTHROID, LEVOTHROID) 75 MCG tablet TAKE 1 TABLET (75 MCG TOTAL) BY MOUTH DAILY. 30 tablet 6  . lidocaine-prilocaine (EMLA) cream Apply to affected area once 30 g 3  . meloxicam (MOBIC) 15 MG tablet Take 15 mg by mouth daily.     . nitroGLYCERIN (NITROSTAT) 0.4 MG SL tablet Place 1 tablet (0.4 mg total) under the tongue every 5 (five) minutes as needed. As needed for chest pain 30 tablet 3  . ondansetron (ZOFRAN ODT) 4 MG disintegrating tablet Take 1 tablet (4 mg total) by mouth every 8 (eight) hours as needed for nausea or vomiting. 20 tablet 0  . Polyethyl Glycol-Propyl Glycol (SYSTANE ULTRA) 0.4-0.3 % SOLN Place 1 drop into both eyes 3 (three) times daily as needed (for dry eyes).     .Marland KitchenPROAIR HFA 108 (90 Base) MCG/ACT inhaler INHALE 2 PUFFS INTO THE LUNGS EVERY 6 (SIX) HOURS AS NEEDED FOR WHEEZING. 8.5 Inhaler 0  . prochlorperazine (COMPAZINE) 10 MG tablet Take 1 tablet (10 mg total) by mouth every 6 (six) hours as needed (Nausea or vomiting). 90 tablet 0  . ranitidine (ZANTAC) 150 MG tablet Take 150 mg by mouth 2 (two) times daily as needed for heartburn. Reported on 10/05/2015    . tiZANidine (ZANAFLEX) 4 MG tablet TAKE 1 TABLET BY MOUTH EVERY 8 HOURS AS NEEDED FOR SPASM  0   No current facility-administered medications for this visit.    Facility-Administered Medications Ordered in Other Visits  Medication Dose Route Frequency Provider Last Rate Last Dose  . sodium chloride flush (NS) 0.9 % injection 10 mL  10 mL Intracatheter PRN GNicholas Lose MD   10 mL at 12/15/15 1439    PHYSICAL EXAMINATION: ECOG PERFORMANCE STATUS: 1 - Symptomatic but completely ambulatory  Vitals:   11/30/16 0927  BP: 121/75  Pulse: 77  Resp: 18  Temp: 98 F (36.7 C)  SpO2: 99%   Filed Weights   11/30/16 0927  Weight: 190 lb 9.6 oz (86.5 kg)    GENERAL:alert, no distress and comfortable SKIN: skin color, texture, turgor are normal, no rashes or significant  lesions EYES: normal, Conjunctiva are pink and non-injected, sclera clear OROPHARYNX:no exudate, no erythema and lips, buccal mucosa, and tongue normal  NECK: supple, thyroid normal size, non-tender, without nodularity LYMPH:  no palpable lymphadenopathy in the cervical, axillary or inguinal LUNGS: clear to auscultation and percussion with normal breathing effort HEART: regular rate & rhythm and no murmurs and no lower extremity edema ABDOMEN:abdomen soft, non-tender and normal bowel sounds MUSCULOSKELETAL:no cyanosis of digits and no clubbing  NEURO: alert & oriented x 3 with fluent speech, no focal motor/sensory deficits EXTREMITIES: No lower extremity edema BREAST: No palpable masses or nodules in either right or left breasts. No palpable axillary supraclavicular or infraclavicular adenopathy no breast tenderness or nipple discharge. (exam performed in the presence of a chaperone)  LABORATORY DATA:  I have reviewed the data as listed   Chemistry      Component Value Date/Time   NA 145 11/12/2016 0738   NA 143 08/31/2016 0820   K 3.6 11/12/2016 0738   K 3.6 08/31/2016 0820   CL 105 08/16/2016 1034   CO2 26 08/31/2016 0820   BUN 16.5 08/31/2016 0820   CREATININE 1.0 08/31/2016  0820      Component Value Date/Time   CALCIUM 9.5 08/31/2016 0820   ALKPHOS 104 08/31/2016 0820   AST 18 08/31/2016 0820   ALT 16 08/31/2016 0820   BILITOT 0.47 08/31/2016 0820       Lab Results  Component Value Date   WBC 8.2 11/30/2016   HGB 12.7 11/30/2016   HCT 39.1 11/30/2016   MCV 96.1 11/30/2016   PLT 251 11/30/2016   NEUTROABS 3.9 11/30/2016    ASSESSMENT & PLAN:  Breast cancer of lower-outer quadrant of right female breast ([place]) Right mastectomy 08/15/2015: IDC grade 2, 2.2 cm, with associated DCIS intermediate grade, separate focus high-grade DCIS, 0/4 lymph nodes negative, T2 N0 stage II a, ER 90%, PR 5%, HER-2 negative duration 1.42, Ki-67 60%  Treatment plan: 1. Adjuvant  chemotherapy with [place] 2. Followed by adjuvant antiestrogen therapy with anastrozole 5 years No role of radiation since she had mastectomy. ----------------------------------------------------------------------------------------------------------------------- Prior treatment: Completed 5 cycles ofTCH, chemotherapy discontinued for neuropathy. Completed Herceptin maintenance 09/29/2006  Echocardiogram was reviewed 08/25/2015: EF 65-70%  Acute Diverticulitis: ED visit 02/22/16 Shingles: Resolved Depression: Patient is going to meet with her primary care physician to discuss the issues with depression. Ice cream addiction:  Plan: 1.Anastrozole 1 mg daily startedin 03/15/16 discontinued 04/25/2016 because of pain in hands and feet, switched her to tamoxifen therapy. But she stopped taking tamoxifen as well. 3. Started letrozole 07/31/2016  CT abdomen and pelvis 07/30/2016: Normal study  Letrozole toxicities: Denies any hot flashes or myalgias. Tolerating it extremely well.  Return to clinic in one year for follow-up   I spent 25 minutes talking to the patient of which more than half was spent in counseling and coordination of care.  No orders of the defined types were placed in this encounter.  The patient has a good understanding of the overall plan. she agrees with it. she will call with any problems that may develop before the next visit here.   Rulon Eisenmenger, MD 11/30/16

## 2016-11-30 NOTE — Patient Instructions (Signed)
Follow up in 3-4 weeks to recheck mood We'll notify you of your lab results and make any changes if needed Start the Citalopram once daily for the anxiety Continue the Wellbutrin daily STOP EATING SWEETS!!!  Or at least cut back! Call with any questions or concerns Hang in there!  We'll figure this out!
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
## 2016-11-30 NOTE — Telephone Encounter (Signed)
appts made and avs printed for patient 

## 2016-12-03 ENCOUNTER — Encounter: Payer: Self-pay | Admitting: Family Medicine

## 2016-12-10 ENCOUNTER — Other Ambulatory Visit: Payer: Self-pay | Admitting: Family Medicine

## 2016-12-17 DIAGNOSIS — M791 Myalgia: Secondary | ICD-10-CM | POA: Diagnosis not present

## 2016-12-17 DIAGNOSIS — E785 Hyperlipidemia, unspecified: Secondary | ICD-10-CM | POA: Diagnosis not present

## 2016-12-17 DIAGNOSIS — I10 Essential (primary) hypertension: Secondary | ICD-10-CM | POA: Diagnosis not present

## 2016-12-17 DIAGNOSIS — E039 Hypothyroidism, unspecified: Secondary | ICD-10-CM | POA: Diagnosis not present

## 2016-12-17 DIAGNOSIS — I2510 Atherosclerotic heart disease of native coronary artery without angina pectoris: Secondary | ICD-10-CM | POA: Diagnosis not present

## 2016-12-27 ENCOUNTER — Encounter: Payer: Self-pay | Admitting: Family Medicine

## 2016-12-27 ENCOUNTER — Ambulatory Visit (INDEPENDENT_AMBULATORY_CARE_PROVIDER_SITE_OTHER): Payer: Medicare Other | Admitting: Family Medicine

## 2016-12-27 ENCOUNTER — Encounter: Payer: Self-pay | Admitting: *Deleted

## 2016-12-27 ENCOUNTER — Other Ambulatory Visit: Payer: Self-pay

## 2016-12-27 VITALS — BP 126/78 | HR 78 | Temp 97.9°F | Resp 16 | Ht 65.0 in | Wt 187.2 lb

## 2016-12-27 DIAGNOSIS — F331 Major depressive disorder, recurrent, moderate: Secondary | ICD-10-CM

## 2016-12-27 DIAGNOSIS — Z598 Other problems related to housing and economic circumstances: Secondary | ICD-10-CM | POA: Diagnosis not present

## 2016-12-27 DIAGNOSIS — Z5987 Material hardship: Secondary | ICD-10-CM
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
## 2016-12-27 NOTE — Patient Instructions (Signed)
Follow up in December to recheck sugar Continue the Celexa daily I will reach out to [place] [facility] to help w/ finances Please contact [place] [phone] (they are located off [address]) Please ask your son to pay for something to help you out Call with any questions or concerns Tracey Evans in there!  You can do this!!!

## 2016-12-27 NOTE — Assessment & Plan Note (Signed)
Pt continues to struggle w/ depression.  She feels the Celexa is improving her mood but has a lot of family/social issues that 'can't be fixed w/ medicine'.  She is not interested in counseling- 'i don't tell people my business'.  Asked pt if she is open to assistance from [facility] care management since she is not able to afford her medication- she said, 'i'll take any help I can get'.  Referral placed.  Will continue to follow closely.

## 2016-12-27 NOTE — Progress Notes (Signed)
Pre visit review using our clinic review tool, if applicable. No additional management support is needed unless otherwise documented below in the visit note. 
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
## 2016-12-31 ENCOUNTER — Other Ambulatory Visit: Payer: Self-pay

## 2016-12-31 ENCOUNTER — Telehealth: Payer: Self-pay | Admitting: Pharmacist

## 2016-12-31 NOTE — Patient Outreach (Signed)
[facility]) Care Management  [place]   12/31/2016  Tracey Evans 1945/09/24 [id number]  Subjective: Patient was called today per referral from [place], Nada Boozer.  Patient is a 71 year old female with multiple medical conditions including but not limited to: hypothyroidism, hyperlipidemia, Osteoarthritis, back pain, GERD, Fibromyalgia, depression, dry eyes, asthma, anemia, and history of breast cancer (2017) --status post mastectomy.  Patient reported using a pill box that she is able to fill on her own. [facility].   Objective:   Encounter Medications: Outpatient Encounter Prescriptions as of 12/31/2016  Medication Sig Note  . ALPRAZolam (XANAX) 1 MG tablet TAKE 1 TABLET BY MOUTH TWICE A DAY AS NEEDED   . amLODipine (NORVASC) 2.5 MG tablet Take 2.5 mg by mouth daily.   Marland Kitchen aspirin 81 MG tablet Take 81 mg by mouth daily.   Marland Kitchen atorvastatin (LIPITOR) 20 MG tablet Take 1 tablet (20 mg total) by mouth every evening.   Marland Kitchen buPROPion (WELLBUTRIN XL) 300 MG 24 hr tablet TAKE 1 TABLET BY MOUTH EVERY DAY   . citalopram (CELEXA) 10 MG tablet Take 1 tablet (10 mg total) by mouth daily.   . diphenhydrAMINE (BENADRYL) 50 MG tablet Take one tablet (50mg s total) one hour prior to study. 12/31/2016: PRN Only before studies   . letrozole (FEMARA) 2.5 MG tablet Take 1 tablet (2.5 mg total) by mouth daily.   Marland Kitchen levothyroxine (SYNTHROID, LEVOTHROID) 75 MCG tablet TAKE 1 TABLET (75 MCG TOTAL) BY MOUTH DAILY.   . meloxicam (MOBIC) 15 MG tablet Take 15 mg by mouth daily.    . nitroGLYCERIN (NITROSTAT) 0.4 MG SL tablet Place 1 tablet (0.4 mg total) under the tongue every 5 (five) minutes as needed. As needed for chest pain   . Polyethyl Glycol-Propyl Glycol (SYSTANE ULTRA) 0.4-0.3 % SOLN Place 1 drop into both eyes 3 (three) times daily as needed (for dry eyes).    Marland Kitchen PROAIR HFA 108 (90 Base) MCG/ACT inhaler INHALE 2 PUFFS INTO THE LUNGS EVERY 6 (SIX)  HOURS AS NEEDED FOR WHEEZING.   . ranitidine (ZANTAC) 150 MG tablet Take 150 mg by mouth 2 (two) times daily as needed for heartburn. Reported on 10/05/2015   . tiZANidine (ZANAFLEX) 4 MG tablet TAKE 1 TABLET BY MOUTH EVERY 8 HOURS AS NEEDED FOR SPASM   . ondansetron (ZOFRAN ODT) 4 MG disintegrating tablet Take 1 tablet (4 mg total) by mouth every 8 (eight) hours as needed for nausea or vomiting. (Patient not taking: Reported on 12/31/2016) 12/31/2016: PRN ONLY  . prochlorperazine (COMPAZINE) 10 MG tablet Take 1 tablet (10 mg total) by mouth every 6 (six) hours as needed (Nausea or vomiting). (Patient not taking: Reported on 12/31/2016) 12/31/2016: PRN ONLY  . [DISCONTINUED] lidocaine-prilocaine (EMLA) cream Apply to affected area once (Patient not taking: Reported on 12/31/2016)    Facility-Administered Encounter Medications as of 12/31/2016  Medication  . sodium chloride flush (NS) 0.9 % injection 10 mL    Functional Status: In your present state of health, do you have any difficulty performing the following activities: 12/31/2016 12/27/2016  Hearing? N N  Vision? N Y  Comment - -  Difficulty concentrating or making decisions? N N  Walking or climbing stairs? N -  Comment - -  Dressing or bathing? N N  Doing errands, shopping? N N  Preparing Food and eating ? N -  Using the Toilet? N -  In the past six months, have you accidently  leaked urine? N -  Do you have problems with loss of bowel control? N -  Managing your Medications? N -  Managing your Finances? Y -  Housekeeping or managing your Housekeeping? N -  Some recent data might be hidden    Fall/Depression Screening: Fall Risk  12/31/2016 12/27/2016 12/27/2016  Falls in the past year? No No No  Number falls in past yr: 1 - -  Injury with Fall? No - -  Risk for fall due to : (No Data) - -  Risk for fall due to: Comment medication - -  Follow up Falls evaluation completed - -   PHQ 2/9 Scores 12/31/2016 12/27/2016 12/27/2016 11/30/2016  08/16/2016 08/16/2016 06/25/2016  PHQ - 2 Score 6 2 2 6 2  0 0  PHQ- 9 Score 16 5 5 16  - 0 0  Exception Documentation - - - - - - -      Assessment: Patient's medications were reviewed over the telephone:   Drugs sorted by system:  Neurologic/Psychologic: Alprazolam Bupropion Citalopram  Cardiovascular: Amlodipine Aspirin Atorvastatin Nitroglycerin   Pulmonary/Allergy: ProAir  Gastrointestinal: Ondansetron Prochlorperazine Ranitidine  Endocrine: Levothyroxine  Pain: Meloxicam Tizanidine  Miscellaneous: Diphenhydramine Systane Ultra (Polyethyl Glycol-Propyl Glycol)  Cancer: Letrozole   Medication Review Findings; Aspirin 81mg -patient reported she is taking Aspirin 81mg  as prescribed by her cardiologist (added to medication list)  Although patient said she can fill her pill box on her own, she did express some concern over getting confused on what day it is and then taking her medications incorrectly.  Patient was offered a Med Alarm and she accepted.  Home visit will be completed.  Plan:  Home visit scheduled for 01/07/17

## 2016-12-31 NOTE — Telephone Encounter (Signed)
-----   Message from Jiles Harold sent at 12/27/2016  3:37 PM EDT ----- Regarding: Referral: Order for Louanne Skye  Referral from Enzo Montgomery, RN  "Please see below request"  Forde Radon ----- Message ----- From: Hayden Pedro, RN Sent: 12/27/2016   3:08 PM To: Thn Cm Communication Orders Subject: Order for MONSERATH, NEFF                    Patient Name: Tracey Evans, Tracey Evans([id number]) Sex: Female DOB: 11/18/1945    PCP: TABORI, [place]: [place]   Types of orders made on 12/27/2016: Nursing  Order Date:12/27/2016 Ordering User:FLORANCE, Shelda Jakes [[id number]] Encounter Provider:Florance, Tomasa Blase, RN [[id number]] Authorizing Provider: Midge Minium, MD [[id number]] Department:[facility][[id number]]  Order Specific Information Order: Comm to Pharmacy [Custom: [id number]]  Order #: [id number] Qty: 1   Priority: Routine  Class: Clinic Performed   Comment:[facility] Pharmacy Referral            Referral from: MD office            Polypharmacy med review-patient taking greater than 10 meds            Medication assistance. Patient unable to afford meds            Please see RN CM note for further details.   Disposition: Return :Research scientist (medical).   Priority: Routine  Class: Clinic Performed   Comment:[facility] Pharmacy Referral            Referral from: MD office            Polypharmacy med review-patient taking greater than 10 meds            Medication assistance. Patient unable to afford meds            Please see RN CM note for further details.

## 2016-12-31 NOTE — Patient Outreach (Addendum)
[facility]) Care Management  [place]  12/31/2016   Tracey Evans [birth year] [id number]  Subjective: RN Health Coach spoke with the patient and HIPAA verified.  The patient states that she lives alone .  She is able to drive. The patient states that her children are very supportive. The patient states that she has had two deaths in her family within the last year and dealing with her ex boyfriend who has put her in debt has her feeling she is unable to breath.  She is depressed and is not doing things that she use to do in the home or traveling.  The patient states that she is on medication for her depression.The patient states that her diet and sleeping are not the same.  She states that having had chemo and dealing with pain adds to her problems.  The patient states that she is willing to work on improving her health. RN Health Coach discussed with the patient about safety in the home, diet and diabetes. The patient verbalized that she understands.  Objective:   Encounter Medications:  Outpatient Encounter Prescriptions as of 12/31/2016  Medication Sig Note  . ALPRAZolam (XANAX) 1 MG tablet TAKE 1 TABLET BY MOUTH TWICE A DAY AS NEEDED   . amLODipine (NORVASC) 2.5 MG tablet Take 2.5 mg by mouth daily.   Marland Kitchen atorvastatin (LIPITOR) 20 MG tablet Take 1 tablet (20 mg total) by mouth every evening.   Marland Kitchen buPROPion (WELLBUTRIN XL) 300 MG 24 hr tablet TAKE 1 TABLET BY MOUTH EVERY DAY   . citalopram (CELEXA) 10 MG tablet Take 1 tablet (10 mg total) by mouth daily.   . diphenhydrAMINE (BENADRYL) 50 MG tablet Take one tablet (50mg s total) one hour prior to study.   Marland Kitchen letrozole (FEMARA) 2.5 MG tablet Take 1 tablet (2.5 mg total) by mouth daily.   Marland Kitchen levothyroxine (SYNTHROID, LEVOTHROID) 75 MCG tablet TAKE 1 TABLET (75 MCG TOTAL) BY MOUTH DAILY.   . meloxicam (MOBIC) 15 MG tablet Take 15 mg by mouth daily.    . nitroGLYCERIN (NITROSTAT) 0.4 MG SL tablet Place 1 tablet (0.4 mg  total) under the tongue every 5 (five) minutes as needed. As needed for chest pain   . ondansetron (ZOFRAN ODT) 4 MG disintegrating tablet Take 1 tablet (4 mg total) by mouth every 8 (eight) hours as needed for nausea or vomiting.   Vladimir Faster Glycol-Propyl Glycol (SYSTANE ULTRA) 0.4-0.3 % SOLN Place 1 drop into both eyes 3 (three) times daily as needed (for dry eyes).    Marland Kitchen PROAIR HFA 108 (90 Base) MCG/ACT inhaler INHALE 2 PUFFS INTO THE LUNGS EVERY 6 (SIX) HOURS AS NEEDED FOR WHEEZING.   . ranitidine (ZANTAC) 150 MG tablet Take 150 mg by mouth 2 (two) times daily as needed for heartburn. Reported on 10/05/2015   . tiZANidine (ZANAFLEX) 4 MG tablet TAKE 1 TABLET BY MOUTH EVERY 8 HOURS AS NEEDED FOR SPASM 11/12/2016: No longer takes per pt  . lidocaine-prilocaine (EMLA) cream Apply to affected area once (Patient not taking: Reported on 12/31/2016)   . prochlorperazine (COMPAZINE) 10 MG tablet Take 1 tablet (10 mg total) by mouth every 6 (six) hours as needed (Nausea or vomiting). (Patient not taking: Reported on 12/31/2016)    Facility-Administered Encounter Medications as of 12/31/2016  Medication  . sodium chloride flush (NS) 0.9 % injection 10 mL    Functional Status:  In your present state of health, do you have any difficulty performing the following  activities: 12/31/2016 12/27/2016  Hearing? N N  Vision? N Y  Comment - -  Difficulty concentrating or making decisions? N N  Walking or climbing stairs? N -  Comment - -  Dressing or bathing? N N  Doing errands, shopping? N N  Preparing Food and eating ? N -  Using the Toilet? N -  In the past six months, have you accidently leaked urine? N -  Do you have problems with loss of bowel control? N -  Managing your Medications? N -  Managing your Finances? Y -  Housekeeping or managing your Housekeeping? N -  Some recent data might be hidden    Fall/Depression Screening: Fall Risk  12/31/2016 12/27/2016 12/27/2016  Falls in the past year? No No  No  Number falls in past yr: 1 - -  Injury with Fall? No - -  Risk for fall due to : (No Data) - -  Risk for fall due to: Comment medication - -  Follow up Falls evaluation completed - -   PHQ 2/9 Scores 12/31/2016 12/27/2016 12/27/2016 11/30/2016 08/16/2016 08/16/2016 06/25/2016  PHQ - 2 Score 6 2 2 6 2  0 0  PHQ- 9 Score 16 5 5 16  - 0 0  Exception Documentation - - - - - - -    Assessment: Patient will benefit from health coach outreach for disease management and support.   Plan: RN Health Coach will provide ongoing education for patient on diabetes through phone calls and sending printed information to patient for further discussion.  RN Health Coach will send welcome packet with consent to patient as well as printed information on diabetes.  RN Health Coach will send initial barriers letter, assessment, and care plan to primary care physician.   RN Health Coach will contact patient in the month of October and patient agrees to next outreach.  THN CM Care Plan Problem One     Most Recent Value  Care Plan Problem One  Diabetes knowledge deficit  Role Documenting the Problem One  Health Coach  Care Plan for Problem One  Active  THN Long Term Goal   Patient will decrease a1c by 0.2 points with diet within 90 days  THN Long Term Goal Start Date  12/31/16  Interventions for Problem One [place] will send the patient educational material on diet  THN CM Short Term Goal #1   Patient will report decrease in consuming sweets in next 30 days  THN CM Short Term Goal #1 Start Date  12/31/16  Interventions for Short Term Goal #1  RN educated the patient on how sweets will affect her blood sugars  THN CM Short Term Goal #2   Patient will work on becoming more active with in the next 30 days  THN CM Short Term Goal #2 Start Date  12/31/16  Interventions for Short Term Goal #2  [place] will discuss with the patient ways to become more active.

## 2017-01-04 ENCOUNTER — Other Ambulatory Visit: Payer: Self-pay | Admitting: *Deleted

## 2017-01-04 ENCOUNTER — Encounter: Payer: Self-pay | Admitting: *Deleted

## 2017-01-04 NOTE — Patient Outreach (Addendum)
[facility]) Care Management  01/04/2017  Tracey Evans April 11, 1945 [id number]   CSW was able to make initial contact with patient today to perform phone assessment, as well as assess and assist with social work needs and services.  CSW introduced self, explained role and types of services provided through [facility] ([place] [facility]).  CSW further explained to patient that CSW works with patient's Telephonic RNCM, also with [name], Tracey Evans. CSW then explained the reason for the call, indicating that Mrs. Evans thought that patient would benefit from social work services and resources to assist with obtaining financial assistance to help purchase food, as well as pay for monthly rent and utilities.  CSW obtained two HIPAA compliant identifiers from patient, which included patient's name and date of birth. Patient admitted that she would like to receive a list of resources that may be able to assist with finances, as "funds get very low by the end of the month".  CSW agreed to mail patient all of the following information and resources: [employer] copy Consent for Treatment [employer] [place] in [place] in [place] and Banker (i.e [employer], [employer], [employer] [place], etc.) [place] then agreed to follow-up with patient in one week to ensure that she received the packet of resources, as well as to answer any questions patient may have at that time and/or assist with the referral process.  Patient voiced understanding and was agreeable to this plan. Nat Christen, BSW, MSW, LCSW  Licensed Education officer, environmental Health System  Mailing [place] [address] [place], [place] [address] Physical Address-[address], [place], [place] [address] Toll Free Main # [phone] Fax # [phone] Cell # [phone]  Office # [phone] [email or web address]

## 2017-01-07 ENCOUNTER — Other Ambulatory Visit: Payer: Self-pay | Admitting: Pharmacist

## 2017-01-07 NOTE — Patient Outreach (Signed)
Tracey Evans) Care Management  [name]   01/07/2017  Tracey Evans Dec 22, 1945 [id number]  Subjective: Home Visit completed at the patient's home. HIPAA identifiers were obtained.  Patient is a 71 year old female with multiple medical conditions including but not limited to: hypothyroidism, hyperlipidemia, Osteoarthritis, back pain, GERD, Fibromyalgia, depression, dry eyes, asthma, anemia, and history of breast cancer (2017) --status post mastectomy.  Patient reported her rib cage hurt today and the pain is chronic.     Objective:   Encounter Medications: Outpatient Encounter Prescriptions as of 01/07/2017  Medication Sig Note  . ALPRAZolam (XANAX) 1 MG tablet TAKE 1 TABLET BY MOUTH TWICE A DAY AS NEEDED   . amLODipine (NORVASC) 2.5 MG tablet Take 2.5 mg by mouth daily.   Marland Kitchen aspirin 81 MG tablet Take 81 mg by mouth daily.   Marland Kitchen atorvastatin (LIPITOR) 20 MG tablet Take 1 tablet (20 mg total) by mouth every evening.   Marland Kitchen buPROPion (WELLBUTRIN XL) 300 MG 24 hr tablet TAKE 1 TABLET BY MOUTH EVERY DAY   . citalopram (CELEXA) 10 MG tablet Take 1 tablet (10 mg total) by mouth daily.   Marland Kitchen letrozole (FEMARA) 2.5 MG tablet Take 1 tablet (2.5 mg total) by mouth daily.   Marland Kitchen levothyroxine (SYNTHROID, LEVOTHROID) 75 MCG tablet TAKE 1 TABLET (75 MCG TOTAL) BY MOUTH DAILY.   . meloxicam (MOBIC) 15 MG tablet Take 15 mg by mouth daily.    . nitroGLYCERIN (NITROSTAT) 0.4 MG SL tablet Place 1 tablet (0.4 mg total) under the tongue every 5 (five) minutes as needed. As needed for chest pain   . Polyethyl Glycol-Propyl Glycol (SYSTANE ULTRA) 0.4-0.3 % SOLN Place 1 drop into both eyes 3 (three) times daily as needed (for dry eyes).    Marland Kitchen PROAIR HFA 108 (90 Base) MCG/ACT inhaler INHALE 2 PUFFS INTO THE LUNGS EVERY 6 (SIX) HOURS AS NEEDED FOR WHEEZING.   . ranitidine (ZANTAC) 150 MG tablet Take 150 mg by mouth 2 (two) times daily as needed for heartburn. Reported on 10/05/2015   .  tiZANidine (ZANAFLEX) 4 MG tablet TAKE 1 TABLET BY MOUTH EVERY 8 HOURS AS NEEDED FOR SPASM   . diphenhydrAMINE (BENADRYL) 50 MG tablet Take one tablet (50mg s total) one hour prior to study. (Patient not taking: Reported on 01/07/2017) 12/31/2016: PRN Only before studies   . ondansetron (ZOFRAN ODT) 4 MG disintegrating tablet Take 1 tablet (4 mg total) by mouth every 8 (eight) hours as needed for nausea or vomiting. (Patient not taking: Reported on 12/31/2016) 12/31/2016: PRN ONLY  . prochlorperazine (COMPAZINE) 10 MG tablet Take 1 tablet (10 mg total) by mouth every 6 (six) hours as needed (Nausea or vomiting). (Patient not taking: Reported on 12/31/2016) 12/31/2016: PRN ONLY   Facility-Administered Encounter Medications as of 01/07/2017  Medication  . sodium chloride flush (NS) 0.9 % injection 10 mL    Functional Status: In your present state of health, do you have any difficulty performing the following activities: 01/04/2017 12/31/2016  Hearing? N N  Vision? N N  Comment - -  Difficulty concentrating or making decisions? N N  Walking or climbing stairs? N N  Comment - -  Dressing or bathing? N N  Doing errands, shopping? N N  Preparing Food and eating ? N N  Using the Toilet? N N  In the past six months, have you accidently leaked urine? N N  Do you have problems with loss of bowel control? N N  Managing  your Medications? N N  Managing your Finances? [name]  Housekeeping or managing your Housekeeping? N N  Some recent data might be hidden    Fall/Depression Screening: Fall Risk  01/04/2017 12/31/2016 12/27/2016  Falls in the past year? Yes No No  Number falls in past yr: 1 1 -  Injury with Fall? No No -  Risk for fall due to : History of fall(s);Impaired balance/gait;Impaired mobility (No Data) -  Risk for fall due to: Comment - medication -  Follow up Education provided;Falls prevention discussed Falls evaluation completed -   PHQ 2/9 Scores 01/04/2017 12/31/2016 12/27/2016 12/27/2016  11/30/2016 08/16/2016 08/16/2016  PHQ - 2 Score 3 6 2 2 6 2  0  PHQ- 9 Score 11 16 5 5 16  - 0  Exception Documentation - - - - - - -   Today's Vitals   01/07/17 1459  PainSc: 5     Assessment: Patient's medications were reviewed while in her home.  Drugs sorted by system:  Neurologic/Psychologic: Alprazolam Bupropion Citalopram  Cardiovascular: Amlodipine Aspirin Atorvastatin Nitroglycerin   Pulmonary/Allergy: ProAir  Gastrointestinal: Ondansetron-does not take Prochlorperazine--does not take Ranitidine  Endocrine: Levothyroxine  Pain: Meloxicam Tizanidine  Miscellaneous: Diphenhydramine Systane Ultra (Polyethyl Glycol-Propyl Glycol)  Cancer: Letrozole  Medication Review Findings: Patient was unable to fill her pill box because she did not have more than 5 days worth of her medications. (She is set to pick up her medications after 01/09/17).   To assist with financial distress and with having enough of each medication to fill her new 30-day pill box system, having her providers write all medications for a 90 day supply.  Education Provided: The 30 day Pill Box system with clock was provided to the patient. Patient was educated on how to use the system      Plan: Ask for 90 day supplies-All meds Dr. Birdie Riddle except letrozole-Gudena Loleta Dicker Route note to PCP Follow up with patient in 7-10 business days to inspect pill system

## 2017-01-07 NOTE — Patient Outreach (Signed)
Request received from Joanna Saporito, LCSW to mail patient personal care resources.  Information mailed today. 

## 2017-01-14 ENCOUNTER — Other Ambulatory Visit: Payer: Self-pay | Admitting: *Deleted

## 2017-01-14 NOTE — Patient Outreach (Signed)
[facility]) Care Management  01/14/2017  Tracey Evans 09-29-1945 [id number]   CSW was able to make contact with patient today to follow-up regarding social work services and resources, as well as to ensure that patient received the packet of resources mailed to her home by CSW.  Patient confirmed that she received the packet on Saturday and that she plans to contact agencies today.  Patient reports doing well, getting back into a "normal" routine.  Patient was most appreciative of the call and the list of resources provided.  Patient denied having any additional social work needs at present. CSW will perform a case closure on patient, as all goals of treatment have been met from social work standpoint.  CSW will notify patient's RNCM with [facility], of CSW's plans to close patient's case.  CSW will fax an update to patient's Primary Care Physician, Dr. Annye Asa to ensure that they are aware of CSW's involvement with patient's plan of care.  CSW will submit a case closure request to Verlon Setting, Care Management Assistant with [place] [facility] [facility], in the form of an [employer].  CSW will ensure that [name] is aware of Lazaro Arms, [place] and Denyse Amass, Pharmacist, both with [facility] [facility], continued involvement with patient's care. Nat Christen, BSW, MSW, LCSW  Licensed Education officer, environmental [facility] System  Mailing [place] [address], [place], [place] [address] Physical Address-[address], [place], [place] [address] Toll Free Main # [phone] Fax # [phone] Cell # [phone]  Office # [phone] [email or web address]

## 2017-01-18 ENCOUNTER — Ambulatory Visit: Payer: Self-pay | Admitting: Pharmacist

## 2017-01-21 ENCOUNTER — Other Ambulatory Visit: Payer: Self-pay

## 2017-01-21 ENCOUNTER — Encounter: Payer: Self-pay | Admitting: Pharmacist

## 2017-01-21 ENCOUNTER — Other Ambulatory Visit: Payer: Self-pay | Admitting: Pharmacist

## 2017-01-21 NOTE — Patient Outreach (Signed)
[facility]) Care Management  [place]   01/21/2017  Tracey Evans 06/27/1945 [id number]  Subjective: Home visit completed at patient's home to check the 30-Day "[place]" that was given to her.  HIPAA identifiers were obtained. Patient confirmed she is doing well with the system and the "Med Center" Alarm.   Objective:   Encounter Medications: Outpatient Encounter Prescriptions as of 01/21/2017  Medication Sig Note  . ALPRAZolam (XANAX) 1 MG tablet TAKE 1 TABLET BY MOUTH TWICE A DAY AS NEEDED   . amLODipine (NORVASC) 2.5 MG tablet Take 2.5 mg by mouth daily.   Marland Kitchen aspirin 81 MG tablet Take 81 mg by mouth daily.   Marland Kitchen atorvastatin (LIPITOR) 20 MG tablet Take 1 tablet (20 mg total) by mouth every evening.   Marland Kitchen buPROPion (WELLBUTRIN XL) 300 MG 24 hr tablet TAKE 1 TABLET BY MOUTH EVERY DAY   . citalopram (CELEXA) 10 MG tablet Take 1 tablet (10 mg total) by mouth daily.   . diphenhydrAMINE (BENADRYL) 50 MG tablet Take one tablet (50mg s total) one hour prior to study. (Patient not taking: Reported on 01/07/2017) 12/31/2016: PRN Only before studies   . letrozole (FEMARA) 2.5 MG tablet Take 1 tablet (2.5 mg total) by mouth daily.   Marland Kitchen levothyroxine (SYNTHROID, LEVOTHROID) 75 MCG tablet TAKE 1 TABLET (75 MCG TOTAL) BY MOUTH DAILY.   . meloxicam (MOBIC) 15 MG tablet Take 15 mg by mouth daily.    . nitroGLYCERIN (NITROSTAT) 0.4 MG SL tablet Place 1 tablet (0.4 mg total) under the tongue every 5 (five) minutes as needed. As needed for chest pain   . ondansetron (ZOFRAN ODT) 4 MG disintegrating tablet Take 1 tablet (4 mg total) by mouth every 8 (eight) hours as needed for nausea or vomiting. (Patient not taking: Reported on 12/31/2016) 12/31/2016: PRN ONLY  . Polyethyl Glycol-Propyl Glycol (SYSTANE ULTRA) 0.4-0.3 % SOLN Place 1 drop into both eyes 3 (three) times daily as needed (for dry eyes).    Marland Kitchen PROAIR HFA 108 (90 Base) MCG/ACT inhaler INHALE 2 PUFFS INTO THE LUNGS  EVERY 6 (SIX) HOURS AS NEEDED FOR WHEEZING.   Marland Kitchen prochlorperazine (COMPAZINE) 10 MG tablet Take 1 tablet (10 mg total) by mouth every 6 (six) hours as needed (Nausea or vomiting). (Patient not taking: Reported on 12/31/2016) 12/31/2016: PRN ONLY  . ranitidine (ZANTAC) 150 MG tablet Take 150 mg by mouth 2 (two) times daily as needed for heartburn. Reported on 10/05/2015   . tiZANidine (ZANAFLEX) 4 MG tablet TAKE 1 TABLET BY MOUTH EVERY 8 HOURS AS NEEDED FOR SPASM    Facility-Administered Encounter Medications as of 01/21/2017  Medication  . sodium chloride flush (NS) 0.9 % injection 10 mL    Functional Status: In your present state of health, do you have any difficulty performing the following activities: 01/04/2017 12/31/2016  Hearing? N N  Vision? N N  Comment - -  Difficulty concentrating or making decisions? N N  Walking or climbing stairs? N N  Comment - -  Dressing or bathing? N N  Doing errands, shopping? N N  Preparing Food and eating ? N N  Using the Toilet? N N  In the past six months, have you accidently leaked urine? N N  Do you have problems with loss of bowel control? N N  Managing your Medications? N N  Managing your Finances? Tracey Evans  Housekeeping or managing your Housekeeping? N N  Some recent data might be hidden  Fall/Depression Screening: Fall Risk  01/04/2017 12/31/2016 12/27/2016  Falls in the past year? Yes No No  Number falls in past yr: 1 1 -  Injury with Fall? No No -  Risk for fall due to : History of fall(s);Impaired balance/gait;Impaired mobility (No Data) -  Risk for fall due to: Comment - medication -  Follow up Education provided;Falls prevention discussed Falls evaluation completed -   PHQ 2/9 Scores 01/04/2017 12/31/2016 12/27/2016 12/27/2016 11/30/2016 08/16/2016 08/16/2016  PHQ - 2 Score 3 6 2 2 6 2  0  PHQ- 9 Score 11 16 5 5 16  - 0  Exception Documentation - - - - - - -      Assessment: Patient's medications were reviewed in her home.  Patient is using  the 30 day "Med Center" Pill Box System and alarm.  Patient reported being able to manage the system without difficulty.  Days 22-31 were missing letrozole but the patient said she has already called in a Letrozole refill and will go and pick it up soon.    Plan:  Route note to PCP. Consult with [facility] Telephonic Nurse, Lazaro Arms Patient mentioned she wanted a Nutritionist--message was relayed to [place] Nurse

## 2017-01-21 NOTE — Patient Outreach (Signed)
[facility]) Care Management  [place]  01/21/2017   Tracey Evans 04-Sep-1945 [id number]  Subjective: [place] spoke with the patient and HIPAA verified. The patient states that she is doing well but is having some pain today. She has pain medications and will be taking it today.  The patient states that she had to drive to [place] to pick up her older sister and get her set up in a nursing home here.  The patient states that she does not check her blood sugars because her physician told her she is diet controlled and she no longer takes diabetic medication. The patient had a visit the pharmacist Denyse Amass.  She made sure that patient has all of  her medications and gave her a pill box that has a clock and alarms.  The patient states that she loves this.  The patient stated that she is not taking her medications as she should because she has to choose between her medications and food.  The patient has spoken with our social worker Nat Christen and given resources to receive food and hot meals. RN Health Coach discussed with the patient the importance of taking advantage of the resources.  RN Health Coach expressed the importance of taken all of her medications to keep her healthy.  The patient verbalized understanding.  Objective:  Encounter Medications:  Outpatient Encounter Prescriptions as of 01/21/2017  Medication Sig Note  . ALPRAZolam (XANAX) 1 MG tablet TAKE 1 TABLET BY MOUTH TWICE A DAY AS NEEDED   . amLODipine (NORVASC) 2.5 MG tablet Take 2.5 mg by mouth daily.   Marland Kitchen aspirin 81 MG tablet Take 81 mg by mouth daily.   Marland Kitchen atorvastatin (LIPITOR) 20 MG tablet Take 1 tablet (20 mg total) by mouth every evening.   Marland Kitchen buPROPion (WELLBUTRIN XL) 300 MG 24 hr tablet TAKE 1 TABLET BY MOUTH EVERY DAY   . citalopram (CELEXA) 10 MG tablet Take 1 tablet (10 mg total) by mouth daily.   . diphenhydrAMINE (BENADRYL) 50 MG tablet Take one tablet (69ms total) one  hour prior to study. 12/31/2016: PRN Only before studies   . letrozole (FEMARA) 2.5 MG tablet Take 1 tablet (2.5 mg total) by mouth daily.   .Marland Kitchenlevothyroxine (SYNTHROID, LEVOTHROID) 75 MCG tablet TAKE 1 TABLET (75 MCG TOTAL) BY MOUTH DAILY.   . meloxicam (MOBIC) 15 MG tablet Take 15 mg by mouth daily.    . nitroGLYCERIN (NITROSTAT) 0.4 MG SL tablet Place 1 tablet (0.4 mg total) under the tongue every 5 (five) minutes as needed. As needed for chest pain   . ondansetron (ZOFRAN ODT) 4 MG disintegrating tablet Take 1 tablet (4 mg total) by mouth every 8 (eight) hours as needed for nausea or vomiting. 12/31/2016: PRN ONLY  . Polyethyl Glycol-Propyl Glycol (SYSTANE ULTRA) 0.4-0.3 % SOLN Place 1 drop into both eyes 3 (three) times daily as needed (for dry eyes).    .Marland KitchenPROAIR HFA 108 (90 Base) MCG/ACT inhaler INHALE 2 PUFFS INTO THE LUNGS EVERY 6 (SIX) HOURS AS NEEDED FOR WHEEZING.   .Marland Kitchenprochlorperazine (COMPAZINE) 10 MG tablet Take 1 tablet (10 mg total) by mouth every 6 (six) hours as needed (Nausea or vomiting). 12/31/2016: PRN ONLY  . ranitidine (ZANTAC) 150 MG tablet Take 150 mg by mouth 2 (two) times daily as needed for heartburn. Reported on 10/05/2015   . tiZANidine (ZANAFLEX) 4 MG tablet TAKE 1 TABLET BY MOUTH EVERY 8 HOURS AS NEEDED FOR SPASM  Facility-Administered Encounter Medications as of 01/21/2017  Medication  . sodium chloride flush (NS) 0.9 % injection 10 mL    Functional Status:  In your present state of health, do you have any difficulty performing the following activities: 01/04/2017 12/31/2016  Hearing? N N  Vision? N N  Comment - -  Difficulty concentrating or making decisions? N N  Walking or climbing stairs? N N  Comment - -  Dressing or bathing? N N  Doing errands, shopping? N N  Preparing Food and eating ? N N  Using the Toilet? N N  In the past six months, have you accidently leaked urine? N N  Do you have problems with loss of bowel control? N N  Managing your  Medications? N N  Managing your Finances? [name]  Housekeeping or managing your Housekeeping? N N  Some recent data might be hidden    Fall/Depression Screening: Fall Risk  01/21/2017 01/04/2017 12/31/2016  Falls in the past year? No Yes No  Number falls in past yr: - 1 1  Injury with Fall? - No No  Risk for fall due to : - History of fall(s);Impaired balance/gait;Impaired mobility (No Data)  Risk for fall due to: Comment - - medication  Follow up - Education provided;Falls prevention discussed Falls evaluation completed   PHQ 2/9 Scores 01/21/2017 01/04/2017 12/31/2016 12/27/2016 12/27/2016 11/30/2016 08/16/2016  PHQ - 2 Score _0 PHQ- 9 Score - _1 -  Exception Documentation - - - - - - -    Assessment: Patient continues to benefit from health coach outreach for disease management and support.   THN CM Care Plan Problem One     Most Recent Value  Care Plan Problem One  Diabetes knowledge deficit  Role Documenting the Problem One  Health Coach  Care Plan for Problem One  Active  THN Long Term Goal   Patient will decrease a1c by 0.2 points with diet within 90 days  THN Long Term Goal Start Date  12/31/16  [facility] Long Term Goal Met Date  01/21/17  Interventions for Problem One Long Term Goal  RN Health Coach will send the patient educational material on diet  THN CM Short Term Goal #1   Patient will report decrease in consuming sweets in next 30 days  THN CM Short Term Goal #1 Start Date  12/31/16  Interventions for Short Term Goal #1  RN discussed  with the patient on how sweets will affect her blood sugars  THN CM Short Term Goal #2   Patient will work on becoming more active with in the next 30 days  THN CM Short Term Goal #2 Start Date  12/31/16  Interventions for Short Term Goal #2  [place]  reviewed with the patient ways to become more active.      Plan: RN Health Coach will contact patient in the month of November and patient agrees to next  outreach.  Lazaro Arms RN, BSN, [place] Direct Dial:  [phone] Fax: [phone]

## 2017-01-29 ENCOUNTER — Other Ambulatory Visit: Payer: Self-pay | Admitting: Family Medicine

## 2017-01-29 NOTE — Telephone Encounter (Signed)
Last OV 12/27/16 Alprazolam filled 11/14/16 #60 with 1

## 2017-01-29 NOTE — Telephone Encounter (Signed)
Medication filled to pharmacy as requested.   

## 2017-02-05 ENCOUNTER — Other Ambulatory Visit: Payer: Self-pay | Admitting: Family Medicine

## 2017-02-13 DIAGNOSIS — M25562 Pain in left knee: Secondary | ICD-10-CM | POA: Diagnosis not present

## 2017-02-13 DIAGNOSIS — G8929 Other chronic pain: Secondary | ICD-10-CM | POA: Diagnosis not present

## 2017-02-20 ENCOUNTER — Ambulatory Visit: Payer: Self-pay | Admitting: *Deleted

## 2017-02-20 ENCOUNTER — Encounter: Payer: Self-pay | Admitting: *Deleted

## 2017-02-20 ENCOUNTER — Other Ambulatory Visit: Payer: Self-pay

## 2017-02-20 NOTE — Patient Outreach (Signed)
[facility]) Care Management  [place]  02/20/2017   Tracey Evans 13-Aug-1945 [id number]  Subjective: Telephone call to patient for monthly outreach. HIPAA verified. The patient states that she is feeling a little more depressed since starting her citalopram.  She wants to get rid of things in her apartment because she feels claustrophobic. She said she has been having a lot of life situations happening and "it throws her off". She stated this has also had her wanting sweets and she has eaten a quart of ice cream for two days strait. RN Health coach advised the patient to call her PCP today and give them information about her situation. Diet was also dicussed with the patient.  She verbalized understanding.  She stated that she has been able to get great enjoyment from starting to crochet again. She is getting out with her church singing on the choir at programs. The patient is adherent with her medications.  She has had some weakness in her knees but no falls. She has an appointment 02-21-2017 with her eye doctor to discuss removal of her cataracts.    Objective:   Encounter Medications:  Outpatient Encounter Medications as of 02/20/2017  Medication Sig Note  . ALPRAZolam (XANAX) 1 MG tablet TAKE 1 TABLET BY MOUTH TWICE A DAY AS NEEDED   . amLODipine (NORVASC) 2.5 MG tablet Take 2.5 mg by mouth daily.   Marland Kitchen aspirin 81 MG tablet Take 81 mg by mouth daily.   Marland Kitchen atorvastatin (LIPITOR) 20 MG tablet Take 1 tablet (20 mg total) by mouth every evening.   Marland Kitchen buPROPion (WELLBUTRIN XL) 300 MG 24 hr tablet TAKE 1 TABLET BY MOUTH EVERY DAY   . citalopram (CELEXA) 10 MG tablet Take 1 tablet (10 mg total) by mouth daily.   Marland Kitchen letrozole (FEMARA) 2.5 MG tablet Take 1 tablet (2.5 mg total) by mouth daily.   Marland Kitchen levothyroxine (SYNTHROID, LEVOTHROID) 75 MCG tablet TAKE 1 TABLET (75 MCG TOTAL) BY MOUTH DAILY.   . meloxicam (MOBIC) 15 MG tablet Take 15 mg by mouth daily.    .  nitroGLYCERIN (NITROSTAT) 0.4 MG SL tablet Place 1 tablet (0.4 mg total) under the tongue every 5 (five) minutes as needed. As needed for chest pain   . ondansetron (ZOFRAN ODT) 4 MG disintegrating tablet Take 1 tablet (4 mg total) by mouth every 8 (eight) hours as needed for nausea or vomiting. 12/31/2016: PRN ONLY  . Polyethyl Glycol-Propyl Glycol (SYSTANE ULTRA) 0.4-0.3 % SOLN Place 1 drop into both eyes 3 (three) times daily as needed (for dry eyes).    Marland Kitchen PROAIR HFA 108 (90 Base) MCG/ACT inhaler INHALE 2 PUFFS INTO THE LUNGS EVERY 6 (SIX) HOURS AS NEEDED FOR WHEEZING.   Marland Kitchen prochlorperazine (COMPAZINE) 10 MG tablet Take 1 tablet (10 mg total) by mouth every 6 (six) hours as needed (Nausea or vomiting). 12/31/2016: PRN ONLY  . ranitidine (ZANTAC) 150 MG tablet Take 150 mg by mouth 2 (two) times daily as needed for heartburn. Reported on 10/05/2015   . tiZANidine (ZANAFLEX) 4 MG tablet TAKE 1 TABLET BY MOUTH EVERY 8 HOURS AS NEEDED FOR SPASM   . diphenhydrAMINE (BENADRYL) 50 MG tablet Take one tablet (50ms total) one hour prior to study. (Patient not taking: Reported on 02/20/2017) 12/31/2016: PRN Only before studies    Facility-Administered Encounter Medications as of 02/20/2017  Medication  . sodium chloride flush (NS) 0.9 % injection 10 mL    Functional Status:  In your present  state of health, do you have any difficulty performing the following activities: 01/04/2017 12/31/2016  Hearing? N N  Vision? N N  Comment - -  Difficulty concentrating or making decisions? N N  Walking or climbing stairs? N N  Comment - -  Dressing or bathing? N N  Doing errands, shopping? N N  Preparing Food and eating ? N N  Using the Toilet? N N  In the past six months, have you accidently leaked urine? N N  Do you have problems with loss of bowel control? N N  Managing your Medications? N N  Managing your Finances? [name]  Housekeeping or managing your Housekeeping? N N  Some recent data might be hidden     Fall/Depression Screening: Fall Risk  02/20/2017 01/21/2017 01/04/2017  Falls in the past year? No No Yes  Number falls in past yr: - - 1  Injury with Fall? - - No  Risk for fall due to : - - History of fall(s);Impaired balance/gait;Impaired mobility  Risk for fall due to: Comment - - -  Follow up - - Education provided;Falls prevention discussed   PHQ 2/9 Scores 02/20/2017 01/21/2017 01/04/2017 12/31/2016 12/27/2016 12/27/2016 11/30/2016  PHQ - 2 Score '1 1 3 6 2 2 6  ' PHQ- 9 Score - - '11 16 5 5 16  ' Exception Documentation - - - - - - -    Assessment: Patient continues to benefit from health coach outreach for disease management and support.  THN CM Care Plan Problem One     Most Recent Value  Care Plan Problem One  Diabetes knowledge deficit  Role Documenting the Problem One  Health Coach  Care Plan for Problem One  Active  THN Long Term Goal   Patient will decrease a1c by 0.2 points with diet within 90 days  THN Long Term Goal Start Date  12/31/16  [facility] Long Term Goal Met Date  01/21/17  Interventions for Problem One Long Term Goal  RN Health Coach talked with the patient about her diet.  THN CM Short Term Goal #1   Patient will report decrease in consuming sweets in next 30 days  THN CM Short Term Goal #1 Start Date  12/31/16  Interventions for Short Term Goal #1  RN reinterated with the patient on how sweets will affect her blood sugars  THN CM Short Term Goal #2   In the Next 30 days the patient will be able to verbalize 1 way of dealing with stress  THN CM Short Term Goal #2 Start Date  02/20/17  [facility] CM Short Term Goal #2 Met Date  02/20/17  Interventions for Short Term Goal #2  [place] will send the patient Educational Material on stress to review for next discussion. RN  Heallth Coach will make a referral to social work for evaluation of the patients copying skills.  THN CM Short Term Goal #3  In the next 30 days the patient will write down questions for her next  doctor, s appointment.  THN CM Short Term Goal #3 Start Date  02/20/17  Interventions for Short Tern Goal #3  [place] talked with the patient about presenting her physician with questions and wrtiting them down so she will not forget.      Plan: RN Health Coach will contact patient in the month of December and patient agrees to next outreach. RN Health coach will send the patients Primary care physician a copy of note.  RN  Health Coach will make Social Work referral for evaluation of coping skills.  Lazaro Arms RN, BSN, [place] Direct Dial:  [phone] Fax: [phone]

## 2017-02-20 NOTE — Telephone Encounter (Signed)
Pt called requesting an appt to see Dr Birdie Riddle before her March 22, 2017; pt states that she has had multiple family/personal issues that are making her anxious and depressed: states there are "too many things happening" pt further states that her children do not get along; her teenage grand daughter is suicidal; her dog died; and a mentor's cancer has re-occured; pt states that she has been eating 2 quarts of ice cream daily starting this past Saturday and Sunday; Pt also states that she wants to sell her furniture (when she has had other bouts of anxiety she would eat chocolate or cut her hair); she also says that crocheting helps but she will stay awake all night doing that; Pt says that she "sleeps all the time and has no desire to get up". She also says that she is ok with waiting for December appointment if necessary; pt was offered the opportunity to see another provider but she states that she does not want to see Einar Pheasant in Dr Virgil Benedict office, and that Dr Birdie Riddle knows her case well.   Reason for Disposition . Patient sounds very upset or troubled to the triager  Answer Assessment - Initial Assessment Questions 1. CONCERN: "What happened that made you call today?"     Anxiety, depression 2. ANXIETY SYMPTOM SCREENING: "Can you describe how you have been feeling?"  (e.g., tense, restless, panicky, anxious, keyed up, trouble sleeping, trouble concentrating)     Sleeps all the time and has no desire to get up 3. ONSET: "How long have you been feeling this way?"     Started January 11, 2017 but has gotten progressively worse over the pst 2 weeks 4. RECURRENT: "Have you felt this way before?"  If yes: "What happened that time?" "What helped these feelings go away in the past?"      crocheting helps because it helps her not think about anything else  5. RISK OF HARM - SUICIDAL IDEATION:  "Do you ever have thoughts of hurting or killing yourself?"  (e.g., yes, no, no but preoccupation with thoughts about  death)   - INTENT:  "Do you have thoughts of hurting or killing yourself right NOW?" (e.g., yes, no, N/A)   - PLAN: "Do you have a specific plan for how you would do this?" (e.g., gun, knife, overdose, no plan, N/A)     no 6. RISK OF HARM - HOMICIDAL IDEATION:  "Do you ever have thoughts of hurting or killing someone else?"  (e.g., yes, no, no but preoccupation with thoughts about death)   - INTENT:  "Do you have thoughts of hurting or killing someone right NOW?" (e.g., yes, no, N/A)   - PLAN: "Do you have a specific plan for how you would do this?" (e.g., gun, knife, no plan, N/A)      no 7. FUNCTIONAL IMPAIRMENT: "How have things been going for you overall in your life? Have you had any more difficulties than usual doing your normal daily activities?"  (e.g., better, same, worse; self-care, school, work, interactions)    yes 8. SUPPORT: "Who is with you now?" "Who do you live with?" "Do you have family or friends nearby who you can talk to?"      Lives alone; Pt has friends that she can talk to 24. THERAPIST: "Do you have a counselor or therapist? Name?"     no 10. STRESSORS: "Has there been any new stress or recent changes in your life?"      Multiple family stressors  11. CAFFEINE ABUSE: "Do you drink caffeinated beverages, and how much each day?" (e.g., coffee, tea, colas)       Mountain Dew Code Blue and Pepsi; 2 liter bottle will last about 3 days; also has 3-20 oz bottles of water per day   12. SUBSTANCE ABUSE: "Do you use any illegal drugs or alcohol?"       no 13. OTHER SYMPTOMS: "Do you have any other physical symptoms right now?" (e.g., chest pain, palpitations, difficulty breathing, fever)       ocassional asthma attack; inhaler gives relief 14. PREGNANCY: "Is there any chance you are pregnant?" "When was your last menstrual period?"      No; hysterectomy  Protocols used: ANXIETY AND PANIC ATTACK-A-AH

## 2017-02-21 ENCOUNTER — Encounter: Payer: Self-pay | Admitting: Family Medicine

## 2017-02-21 ENCOUNTER — Other Ambulatory Visit: Payer: Self-pay

## 2017-02-21 ENCOUNTER — Ambulatory Visit (INDEPENDENT_AMBULATORY_CARE_PROVIDER_SITE_OTHER): Payer: Medicare Other | Admitting: Family Medicine

## 2017-02-21 VITALS — BP 124/81 | HR 72 | Temp 97.9°F | Resp 16 | Ht 65.0 in | Wt 193.4 lb

## 2017-02-21 DIAGNOSIS — H04123 Dry eye syndrome of bilateral lacrimal glands: Secondary | ICD-10-CM | POA: Diagnosis not present

## 2017-02-21 DIAGNOSIS — H2513 Age-related nuclear cataract, bilateral: Secondary | ICD-10-CM | POA: Diagnosis not present

## 2017-02-21 DIAGNOSIS — F331 Major depressive disorder, recurrent, moderate: Secondary | ICD-10-CM

## 2017-02-21 MED ORDER — CITALOPRAM HYDROBROMIDE 20 MG PO TABS: 20.0000 mg | ORAL_TABLET | Freq: Every day | ORAL | 3 refills | Status: DC

## 2017-02-21 NOTE — Telephone Encounter (Signed)
Please advise. Pt was on our schedule for today with no notes. Upon looking in chart I saw this message. It was not routed anywhere yesterday that I can see.   Message forwarded to PCP and Glass blower/designer.

## 2017-02-21 NOTE — Progress Notes (Signed)
   Subjective:    Patient ID: Tracey Evans, female    DOB: 12-Dec-1945, 71 y.o.   MRN: [id number]  HPI Depression- chronic problem, on Citalopram 10mg  daily.  'It's like i'm crawling within myself'.  2 older children 'can't stand' the youngest and 'are treating her so bad'.  60 yr old granddaughter is talking about suicide.  'I feel like i'm falling apart- everything is hurting'.  Fibromyalgia has flared.  She is crocheting again which makes her happy.  Pt reports she wants to get rid of her living room furniture b/c 'it's making me claustrophobic'.  Pt is stress eating.   Review of Systems For ROS see HPI     Objective:   Physical Exam  Constitutional: She is oriented to person, place, and time. She appears well-developed and well-nourished. No distress.  HENT:  Head: Normocephalic and atraumatic.  Neurological: She is alert and oriented to person, place, and time.  Skin: Skin is warm and dry.  Psychiatric: She has a normal mood and affect. Her behavior is normal.  Tangential thought process- at times difficult to follow  Vitals reviewed.         Assessment & Plan:

## 2017-02-21 NOTE — Telephone Encounter (Signed)
There is no disposition or resolution to this call.  I am seeing her now, but that is not documented in this note and the note was not sent anywhere for review.  I will send this to management to provide feedback to [facility]

## 2017-02-21 NOTE — Patient Instructions (Signed)
Follow up as scheduled to recheck diabetes and mood Increase the Citalopram (Celexa) to 20mg  daily.  2 of what you have at home and 1 of the new prescription It's OK to say NO! I'm happy that you are crocheting again! Call with any questions or concerns Happy Thanksgiving!!

## 2017-02-22 ENCOUNTER — Encounter: Payer: Self-pay | Admitting: *Deleted

## 2017-02-22 ENCOUNTER — Other Ambulatory Visit: Payer: Self-pay | Admitting: *Deleted

## 2017-02-22 NOTE — Assessment & Plan Note (Signed)
Deteriorated recently w/ family stressors.  She is aware that she is having irrational thoughts- wanting to get rid of all her furniture- and at this point, is not giving into them.  Will increase Celexa to 20mg  daily.  She has social work available via [employer]  She has an outlet with her crocheting and has a goal to make 4 afghans- this is reassuring.  Will continue to follow closely.

## 2017-02-22 NOTE — Patient Outreach (Addendum)
[facility]) Care Management  02/22/2017  Tracey Evans 1945-11-27 [id number]   CSW was able to make initial contact with patient today to perform phone assessment, as well as assess and assist with social work needs and services.  CSW introduced self, explained role and types of services provided through [place] [facility] ([place] [facility]).  CSW further explained to patient that CSW works with patient's [place], also with [place] [facility], Lazaro Arms. CSW then explained the reason for the call, indicating that Mrs. Tracey Evans thought that patient would benefit from social work services and resources to assist with developing positive coping skills.  CSW obtained two HIPAA compliant identifiers from patient, which included patient's name and date of birth. Patient admitted that she has a difficult time coping with her symptoms of anxiety and depression when she becomes overwhelmed with various concerns in her life.  Patient is agreeable to meeting with CSW to learn various techniques to help deal with these symptoms.  CSW will provide patient with literature, as well as teach patient positive coping mechanisms, as well as deep breathing exercises, when she becomes overwhelmed, anxious and/or depressed.  CSW and patient have scheduled the initial home visit for Monday, March 04, 2017 at 10:00am.  Patient has been encouraged to contact CSW if assistance is needed in the meantime.  Patient voiced understanding and was agreeable to this plan. THN CM Care Plan Problem One     Most Recent Value  Care Plan Problem One  Lack of coping skills to deal with symptoms of anxiety and depression.  Role Documenting the Problem One  Clinical Social Worker  Care Plan for Problem One  Active  THN CM Short Term Goal #1   CSW will meet with patient for an initial home visit to assess level of anxiety and depression, within the next two weeks.  THN CM Short  Term Goal #1 Start Date  02/22/17  Interventions for Short Term Goal #1  Initial home visit has been scheduled with patient for Monday, March 04, 2017 at 10:00am.  [facility] CM Short Term Goal #2   Patient will utilize deep breathing exercises, and utilize positive coping mechanisms to help deal with symptoms of anxiety and depression, within the next 30 days.  THN CM Short Term Goal #2 Start Date  02/22/17  Interventions for Short Term Goal #2  CSW will provide patient with information and techniques to help deal with symptoms of anxiety and depression.     Nat Christen, BSW, MSW, LCSW  Licensed Education officer, [facility]  [place] [place] [address], [place], [place] [address] Physical Address-[address], [place], [place] [address] Toll Free Main # [phone] Fax # [phone] Cell # [phone]  Office # [phone] [email or web address]

## 2017-02-24 ENCOUNTER — Other Ambulatory Visit: Payer: Self-pay | Admitting: Family Medicine

## 2017-03-04 ENCOUNTER — Ambulatory Visit: Payer: Self-pay | Admitting: *Deleted

## 2017-03-05 ENCOUNTER — Other Ambulatory Visit: Payer: Self-pay | Admitting: *Deleted

## 2017-03-05 NOTE — Patient Outreach (Signed)
[facility]) Care Management  03/05/2017  Tracey Evans 14-May-1945 [id number]   CSW was able to meet with patient today to perform the initial home visit.  Patient admitted that she is not having a very good day today, suffering from Fibromyalgia pain and symptoms.  Despite the pain, patient appeared to be in good spirits.  Patient also talked about suffering from hot flashes and neuropathy in her arms, feet and legs, as a result of the chemotherapy drugs she was taking for Breast Cancer.  Patient reported, "I was told that I am cancer-free, but I worry about it returning every day".  Patient admits that she is now dealing with "self image" and "body image" issues, as she fears that she is no longer desirable to anyone since having her mastectomy.  Having worked in oncology for twelve years, CSW was able to offer counseling and supportive services, specifically pertaining to the emotional and physical trauma associated with being a cancer survivor. CSW spoke with patient at length about all the wonderful support programs that are offered through the [facility], encouraging patient to consider attending a support group.  Patient stated, "I don't think I can sit around and listen to other women cry while telling their stories".  CSW explained that this was merely a disillusion, that the support groups are actually quite the opposite.  Having facilitated several support groups at the [facility], CSW was able to explain the flow of the support groups, topics discussed and the fact that they offer validation for the way in which women feel after having gone through such a horrific event.  Patient reported that she may consider attending one meeting to learn more about services offered, requesting that CSW send her information and a schedule for the Breast Cancer Support Group - Finding Your New Normal.  CSW also spoke with patient about Grief and Loss  Counseling through [place], after patient talked about experiencing seven deaths within a three year period of time. Patient talked a lot about her childhood and several traumatic events that occurred.  CSW listened attentively, offering Cognitive Behavioral Therapy, where appropriate.  CSW explained to patient that she would greatly benefit from ongoing counseling services through a licensed therapist; however, patient declined.  Patient stated, "I have blocked out those memories for a reason, I do not wish to drudge them back up".  CSW voiced understanding, but encouraged patient to at least consider therapeutic services, providing patient with a complete list of licensed counselors that accept Traditional Medicare.  CSW reviewed with patient literature that CSW had printed pertaining specifically to symptoms of anxiety and depression.  CSW also taught patient deep breathing exercises and relaxation techniques for when she becomes anxious and/or depressed.  Patient was convinced that counseling services were not for her at the beginning of the session; however, she began to change her mind and reported feeling very comfortable disclosing personal information to CSW.  CSW explained to patient that CSW can continue to offer counseling services to patient until she is established with a licensed therapist of choice.  Patient does not believe this type of support would be beneficial to her, requesting that CSW contact her every few weeks to "check in".  CSW agreed to do so and is scheduled to contact patient by phone in two weeks.  Patient was most appreciative of the visit and support provided during the session. [facility] CM Care Plan Problem  One     Most Recent Value  Care Plan Problem One  Lack of coping skills to deal with symptoms of anxiety and depression.  Role Documenting the Problem One  Clinical Social Worker  Care Plan for Problem One  Active  THN CM Short Term  Goal #1   CSW will meet with patient for an initial home visit to assess level of anxiety and depression, within the next two weeks.  THN CM Short Term Goal #1 Start Date  02/22/17  [facility] CM Short Term Goal #1 Met Date  03/05/17  Interventions for Short Term Goal #1  Initial home visit has been scheduled with patient for Monday, March 04, 2017 at 10:00am.  [facility] CM Short Term Goal #2   Patient will utilize deep breathing exercises, and utilize positive coping mechanisms to help deal with symptoms of anxiety and depression, within the next 30 days.  THN CM Short Term Goal #2 Start Date  02/22/17  Interventions for Short Term Goal #2  CSW will provide patient with information and techniques to help deal with symptoms of anxiety and depression.    Nat Christen, BSW, MSW, LCSW  Licensed Education officer, environmental Health System  Mailing [place] [address], [place], [place] [address] Physical Address-[address], [place], [place] [address] Toll Free Main # [phone] Fax # [phone] Cell # [phone]  Office # [phone] [email or web address]

## 2017-03-06 ENCOUNTER — Encounter: Payer: Self-pay | Admitting: General Practice

## 2017-03-06 NOTE — Progress Notes (Unsigned)
CSW received referral from Nat Christen, [place] Worker, asking for call to patient to provide resources/support.  CSW called patient at [phone]; explored patient concerns.  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  CSW provided active listening support and validation of patient concerns, as well as normalization of process of adjusting to physical changes associated with treatment.  CSW encouraged patient to connect w available resources at [facility] and mailed current calendar and information about breast cancer support group to patient at her home.  Patient expressed appreciation for call and resources.  Edwyna Shell, LCSW Clinical Social Worker Phone:  [phone]

## 2017-03-08 ENCOUNTER — Other Ambulatory Visit: Payer: Self-pay | Admitting: Pharmacist

## 2017-03-08 ENCOUNTER — Ambulatory Visit: Payer: Self-pay | Admitting: Pharmacist

## 2017-03-08 NOTE — Patient Outreach (Signed)
[name]) Care Management  03/08/2017  Tracey Evans [birth year] [id number]   Called patient to follow up on medication assistance. Patient was given a 30-Day Pill Box earlier by [facility].  Unfortunately, patient did not answer the phone. HIPAA compliant message was left on the patient's voicemail.  Plan:  Call patient back in 5-7 business days.  Elayne Guerin, PharmD, [place] Clinical Pharmacist [phone]

## 2017-03-08 NOTE — Patient Outreach (Signed)
[facility]) Care Management  03/08/2017  Tracey Evans March 05, 1946 [id number]   Patient called me back after I called her to follow up on medication assistance. HIPAA identifiers were obtained. Patient stated she was feeling well and was actually on the highway driving to [place] for her granddaughter's birthday party this weekend.  Patient confirmed she understands and is able to use her thirty-day pill box efficiently.    Plan: Close patient's pharmacy case since she understands how to use her pill box and is taking her medications as prescribed.  Patient will still be followed by [facility].  Patient has my number and understands she can call at anytime in the future with medication related calls or concerns.  Elayne Guerin, PharmD, [place] Clinical Pharmacist [phone]

## 2017-03-12 ENCOUNTER — Encounter: Payer: Self-pay | Admitting: Family Medicine

## 2017-03-14 ENCOUNTER — Other Ambulatory Visit: Payer: Self-pay | Admitting: General Practice

## 2017-03-14 ENCOUNTER — Other Ambulatory Visit: Payer: Self-pay | Admitting: Pharmacist

## 2017-03-14 DIAGNOSIS — H2513 Age-related nuclear cataract, bilateral: Secondary | ICD-10-CM | POA: Diagnosis not present

## 2017-03-14 DIAGNOSIS — M25562 Pain in left knee: Secondary | ICD-10-CM | POA: Diagnosis not present

## 2017-03-14 DIAGNOSIS — G8929 Other chronic pain: Secondary | ICD-10-CM | POA: Diagnosis not present

## 2017-03-14 MED ORDER — LEVOTHYROXINE SODIUM 75 MCG PO TABS: 75.0000 ug | ORAL_TABLET | Freq: Every day | ORAL | 1 refills | Status: DC

## 2017-03-14 MED ORDER — ATORVASTATIN CALCIUM 20 MG PO TABS: ORAL_TABLET | ORAL | 1 refills | Status: DC

## 2017-03-14 MED ORDER — AMLODIPINE BESYLATE 2.5 MG PO TABS: 2.5000 mg | ORAL_TABLET | Freq: Every day | ORAL | 1 refills | Status: DC

## 2017-03-14 MED ORDER — BUPROPION HCL ER (XL) 300 MG PO TB24: 300.0000 mg | ORAL_TABLET | Freq: Every day | ORAL | 1 refills | Status: DC

## 2017-03-14 MED ORDER — CITALOPRAM HYDROBROMIDE 10 MG PO TABS: 10.0000 mg | ORAL_TABLET | Freq: Every day | ORAL | 1 refills | Status: DC

## 2017-03-14 NOTE — Patient Outreach (Signed)
[facility]) Care Management  03/14/2017  Tracey Evans 1946/02/24 [id number]   Patient was called to follow up. HIPAA identifiers were obtained. Patient shared she had a great time visiting her granddaughter in [place], [place].  She expressed concern getting in touch with the dentist [facility]). Patient said she has been calling the office daily and leaves a message each time but does not get a call back.  Patient also wondered if she can get 90 day supplies of all of her medications and have them synced.  I called [facility] on [address] and spoke to a representative that said they only see Children and Pregnant Women.  Plan: Send message to [place] Worker, Nat Christen about the [employer]   Route note to Dr. Birdie Riddle about sending in new prescriptions for 90 day supplies.  Follow up with patient in 2 weeks.  Elayne Guerin, PharmD, [place] Clinical Pharmacist [phone]

## 2017-03-14 NOTE — Progress Notes (Signed)
Medication filled to pharmacy as requested.  Pt made aware.  

## 2017-03-19 ENCOUNTER — Other Ambulatory Visit: Payer: Self-pay | Admitting: *Deleted

## 2017-03-19 ENCOUNTER — Encounter: Payer: Self-pay | Admitting: *Deleted

## 2017-03-19 NOTE — Patient Outreach (Signed)
[facility]) Care Management  03/19/2017  ANIELLE HEADRICK December 05, 1945 [id number]  CSW was able to make contact with patient today to follow-up regarding social work services and resources.  Patient appeared to be in great spirits today, talking a lot about all the upcoming events she has planned for the holidays.  Patient reported feeling well today, but needing to have Cortizone injections in both knees, which is already scheduled.  Patient is also scheduled to have Cataract Surgery on Monday, April 15, 2017.  Patient plans to follow-up with her Primary Care Physician, Dr. Annye Asa, on Friday, February 20, 2017, at which time she will discuss the cyst that recently appeared around her breast incision sight.  Patient reported that it is not painful, just bothersome, and she wants to ensure that it is nothing to worry about. Patient has recently sewn 6 afghans, planning to give them as Christmas presents within the coming weeks.  Patient reported that sewing is very therapeutic for her and she often stays up until the early morning hours to get them completed.  Patient admitted that she has been practicing her deep breathing exercises and relaxation techniques when she becomes somewhat anxious and/or depressed.  Patient also reported that she watches her Isaac Bliss movies, all thirteen of them, when she wishes to take her mind off of things that are troubling her.  CSW agreed to follow-up with patient again in two weeks to assess and assist need for continued social work involvement.   THN CM Care Plan Problem One     Most Recent Value  Care Plan Problem One  Lack of coping skills to deal with symptoms of anxiety and depression.  Role Documenting the Problem One  Clinical Social Worker  Care Plan for Problem One  Active  THN CM Short Term Goal #1   CSW will meet with patient for an initial home visit to assess level of anxiety and depression, within the next two weeks.  THN  CM Short Term Goal #1 Start Date  02/22/17  [facility] CM Short Term Goal #1 Met Date  03/05/17  Interventions for Short Term Goal #1  Initial home visit has been scheduled with patient for Monday, March 04, 2017 at 10:00am.  [facility] CM Short Term Goal #2   Patient will utilize deep breathing exercises, and utilize positive coping mechanisms to help deal with symptoms of anxiety and depression, within the next 30 days.  THN CM Short Term Goal #2 Start Date  02/22/17  [facility] CM Short Term Goal #2 Met Date  03/19/17  Interventions for Short Term Goal #2  CSW will provide patient with information and techniques to help deal with symptoms of anxiety and depression.     Nat Christen, BSW, MSW, LCSW  Licensed Education officer, environmental Health System  Mailing [place] [address], [place], [place] [address] Physical Address-[address], [place], [place] [address] Toll Free Main # [phone] Fax # [phone] Cell # [phone]  Office # [phone] [email or web address]

## 2017-03-22 ENCOUNTER — Other Ambulatory Visit: Payer: Self-pay

## 2017-03-22 ENCOUNTER — Ambulatory Visit (INDEPENDENT_AMBULATORY_CARE_PROVIDER_SITE_OTHER): Payer: Medicare Other | Admitting: Family Medicine

## 2017-03-22 ENCOUNTER — Encounter: Payer: Self-pay | Admitting: Family Medicine

## 2017-03-22 VITALS — BP 130/82 | HR 79 | Temp 97.9°F | Resp 16 | Ht 65.0 in | Wt 198.4 lb

## 2017-03-22 DIAGNOSIS — Z23 Encounter for immunization: Secondary | ICD-10-CM | POA: Diagnosis not present

## 2017-03-22 DIAGNOSIS — E119 Type 2 diabetes mellitus without complications: Secondary | ICD-10-CM | POA: Diagnosis not present

## 2017-03-22 DIAGNOSIS — I10 Essential (primary) hypertension: Secondary | ICD-10-CM

## 2017-03-22 DIAGNOSIS — E1169 Type 2 diabetes mellitus with other specified complication: Secondary | ICD-10-CM

## 2017-03-22 DIAGNOSIS — E785 Hyperlipidemia, unspecified: Secondary | ICD-10-CM

## 2017-03-22 LAB — CBC WITH DIFFERENTIAL/PLATELET
BASOS PCT: 0.5 % (ref 0.0–3.0)
Basophils Absolute: 0 10*3/uL (ref 0.0–0.1)
EOS ABS: 0.3 10*3/uL (ref 0.0–0.7)
Eosinophils Relative: 3.4 % (ref 0.0–5.0)
HEMATOCRIT: 39.4 % (ref 36.0–46.0)
Hemoglobin: 12.9 g/dL (ref 12.0–15.0)
LYMPHS PCT: 39.7 % (ref 12.0–46.0)
Lymphs Abs: 3.3 10*3/uL (ref 0.7–4.0)
MCHC: 32.7 g/dL (ref 30.0–36.0)
MCV: 96 fl (ref 78.0–100.0)
MONO ABS: 0.7 10*3/uL (ref 0.1–1.0)
Monocytes Relative: 8 % (ref 3.0–12.0)
Neutro Abs: 4 10*3/uL (ref 1.4–7.7)
Neutrophils Relative %: 48.4 % (ref 43.0–77.0)
PLATELETS: 282 10*3/uL (ref 150.0–400.0)
RBC: 4.11 Mil/uL (ref 3.87–5.11)
RDW: 14.4 % (ref 11.5–15.5)
WBC: 8.4 10*3/uL (ref 4.0–10.5)

## 2017-03-22 LAB — BASIC METABOLIC PANEL
BUN: 17 mg/dL (ref 6–23)
CHLORIDE: 104 meq/L (ref 96–112)
CO2: 34 meq/L — AB (ref 19–32)
Calcium: 9 mg/dL (ref 8.4–10.5)
Creatinine, Ser: 1.04 mg/dL (ref 0.40–1.20)
GFR: 67.15 mL/min (ref >60.00)
Glucose, Bld: 93 mg/dL (ref 70–99)
POTASSIUM: 4.6 meq/L (ref 3.5–5.1)
SODIUM: 142 meq/L (ref 135–145)

## 2017-03-22 LAB — HEPATIC FUNCTION PANEL
ALK PHOS: 87 U/L (ref 39–117)
ALT: 25 U/L (ref 0–35)
AST: 14 U/L (ref 0–37)
Albumin: 3.8 g/dL (ref 3.5–5.2)
BILIRUBIN DIRECT: 0.1 mg/dL (ref 0.0–0.3)
BILIRUBIN TOTAL: 0.6 mg/dL (ref 0.2–1.2)
Total Protein: 6 g/dL (ref 6.0–8.3)

## 2017-03-22 LAB — TSH: TSH: 8.1 u[IU]/mL — ABNORMAL HIGH (ref 0.35–4.50)

## 2017-03-22 LAB — LIPID PANEL
Cholesterol: 119 mg/dL (ref 0–200)
HDL: 51.4 mg/dL (ref >39.00)
LDL Cholesterol: 43 mg/dL (ref 0–99)
NONHDL: 67.5
Total CHOL/HDL Ratio: 2
Triglycerides: 123 mg/dL (ref 0.0–149.0)
VLDL: 24.6 mg/dL (ref 0.0–40.0)

## 2017-03-22 LAB — HEMOGLOBIN A1C: HEMOGLOBIN A1C: 6.5 % (ref 4.6–6.5)

## 2017-03-22 NOTE — Progress Notes (Signed)
   Subjective:    Patient ID: Tracey Evans, female    DOB: February 27, 1946, 71 y.o.   MRN: [id number]  HPI DM- chronic problem, diet controlled.  UTD on microalbumin, foot exam, eye exam.  Pt has gained 5 lbs since last visit.  Admits to poor dietary choices.  Denies symptomatic lows.  + chemo neuropathy.  Hyperlipidemia- chronic problem.  On Lipitor 20mg  daily.  Denies abd pain, N/V.  HTN- chronic problem, on Amlodipine 2.5mg  daily.  Denies CP, SOB, HAs, visual changes, edema.   Review of Systems For ROS see HPI     Objective:   Physical Exam  Constitutional: She is oriented to person, place, and time. She appears well-developed and well-nourished. No distress.  HENT:  Head: Normocephalic and atraumatic.  Eyes: Conjunctivae and EOM are normal. Pupils are equal, round, and reactive to light.  Neck: Normal range of motion. Neck supple. No thyromegaly present.  Cardiovascular: Normal rate, regular rhythm, normal heart sounds and intact distal pulses.  No murmur heard. Pulmonary/Chest: Effort normal and breath sounds normal. No respiratory distress.  Abdominal: Soft. She exhibits no distension. There is no tenderness.  Musculoskeletal: She exhibits no edema.  Lymphadenopathy:    She has no cervical adenopathy.  Neurological: She is alert and oriented to person, place, and time.  Skin: Skin is warm and dry.  Psychiatric: She has a normal mood and affect. Her behavior is normal.  Vitals reviewed.         Assessment & Plan:

## 2017-03-22 NOTE — Assessment & Plan Note (Signed)
Chronic problem.  Currently diet controlled but pt admits to poor diet recently.  UTD on foot exam, eye exam, and microalbumin.  Stressed need for healthy diet and regular exercise.  Check labs.  Adjust tx plan prn.

## 2017-03-22 NOTE — Assessment & Plan Note (Signed)
Chronic problem.  Tolerating statin w/o difficulty.  Stressed need for healthy diet and regular exercise.  Check labs.  Adjust meds prn  

## 2017-03-22 NOTE — Patient Instructions (Signed)
Follow up in 3-4 months to recheck sugars We'll notify you of your lab results and make any changes if needed Keep up the good work!  You look great! Try and limit the sweets! Add daily allergy medication to help with the drainage Call with any questions or concerns Happy Holidays!!!

## 2017-03-22 NOTE — Assessment & Plan Note (Signed)
Chronic problem.  Well controlled today.  Asymptomatic.  Check labs.  No anticipated med changes.  Will follow. 

## 2017-03-25 ENCOUNTER — Other Ambulatory Visit: Payer: Self-pay

## 2017-03-25 ENCOUNTER — Other Ambulatory Visit: Payer: Self-pay | Admitting: General Practice

## 2017-03-25 DIAGNOSIS — Z803 Family history of malignant neoplasm of breast: Secondary | ICD-10-CM | POA: Diagnosis not present

## 2017-03-25 DIAGNOSIS — C50111 Malignant neoplasm of central portion of right female breast: Secondary | ICD-10-CM | POA: Diagnosis not present

## 2017-03-25 DIAGNOSIS — M797 Fibromyalgia: Secondary | ICD-10-CM | POA: Diagnosis not present

## 2017-03-25 DIAGNOSIS — M940 Chondrocostal junction syndrome [Tietze]: Secondary | ICD-10-CM | POA: Diagnosis not present

## 2017-03-25 DIAGNOSIS — I10 Essential (primary) hypertension: Secondary | ICD-10-CM | POA: Diagnosis not present

## 2017-03-25 MED ORDER — LEVOTHYROXINE SODIUM 100 MCG PO TABS: 100.0000 ug | ORAL_TABLET | Freq: Every day | ORAL | 3 refills | Status: DC

## 2017-03-25 NOTE — Patient Outreach (Signed)
[facility]) Care Management  Tracey Evans  03/25/2017   Tracey Evans 1945-11-02 [id number]  Subjective:  Telephone call placed to the patient for monthly outreach. HIPAA verified. The patient states that she is in some pain today.  She rates it at a 10.  She is having pain with her back.  She sees pain management for the pain.  She states that she is adherent with her medications. The patient is in very high spirits even though she has pain.  She contributes it to everyone who has called and spoke with her like the social worker Nat Christen social worker and Engineer, civil (consulting).  She said that she does not feel so alone. The patient just has an appointment with Dr. Birdie Riddle and had her a1c checked but did not know the result at this time. The patient states that she has an appointment on January 7th to have her cataracts removed.  She will also see her orthopedist to see if she will need surgery or an injection in her knees.   Objective:   Encounter Medications:  Outpatient Encounter Medications as of 03/25/2017  Medication Sig Note  . ALPRAZolam (XANAX) 1 MG tablet TAKE 1 TABLET BY MOUTH TWICE A DAY AS NEEDED   . amLODipine (NORVASC) 2.5 MG tablet Take 1 tablet (2.5 mg total) by mouth daily.   Marland Kitchen aspirin 81 MG tablet Take 81 mg by mouth daily.   Marland Kitchen atorvastatin (LIPITOR) 20 MG tablet TAKE 1 TABLET BY MOUTH EVERY DAY IN THE EVENING   . buPROPion (WELLBUTRIN XL) 300 MG 24 hr tablet Take 1 tablet (300 mg total) by mouth daily.   . citalopram (CELEXA) 10 MG tablet Take 1 tablet (10 mg total) by mouth daily.   Marland Kitchen letrozole (FEMARA) 2.5 MG tablet Take 1 tablet (2.5 mg total) by mouth daily.   Marland Kitchen levothyroxine (SYNTHROID, LEVOTHROID) 75 MCG tablet Take 1 tablet (75 mcg total) by mouth daily.   . meloxicam (MOBIC) 15 MG tablet Take 15 mg by mouth daily.    . ondansetron (ZOFRAN ODT) 4 MG disintegrating tablet Take 1 tablet (4 mg total) by mouth every 8 (eight) hours  as needed for nausea or vomiting. 12/31/2016: PRN ONLY  . Polyethyl Glycol-Propyl Glycol (SYSTANE ULTRA) 0.4-0.3 % SOLN Place 1 drop into both eyes 3 (three) times daily as needed (for dry eyes).    Marland Kitchen PROAIR HFA 108 (90 Base) MCG/ACT inhaler INHALE 2 PUFFS INTO THE LUNGS EVERY 6 (SIX) HOURS AS NEEDED FOR WHEEZING.   Marland Kitchen prochlorperazine (COMPAZINE) 10 MG tablet Take 1 tablet (10 mg total) by mouth every 6 (six) hours as needed (Nausea or vomiting). 12/31/2016: PRN ONLY  . ranitidine (ZANTAC) 150 MG tablet Take 150 mg by mouth 2 (two) times daily as needed for heartburn. Reported on 10/05/2015   . tiZANidine (ZANAFLEX) 4 MG tablet TAKE 1 TABLET BY MOUTH EVERY 8 HOURS AS NEEDED FOR SPASM   . diphenhydrAMINE (BENADRYL) 50 MG tablet Take one tablet (50mg s total) one hour prior to study. (Patient not taking: Reported on 03/25/2017) 12/31/2016: PRN Only before studies   . nitroGLYCERIN (NITROSTAT) 0.4 MG SL tablet Place 1 tablet (0.4 mg total) under the tongue every 5 (five) minutes as needed. As needed for chest pain    Facility-Administered Encounter Medications as of 03/25/2017  Medication  . sodium chloride flush (NS) 0.9 % injection 10 mL    Functional Status:  In your present state of health, do you have  any difficulty performing the following activities: 03/22/2017 02/22/2017  Hearing? N N  Vision? N N  Difficulty concentrating or making decisions? N N  Walking or climbing stairs? N N  Dressing or bathing? N N  Doing errands, shopping? N N  Preparing Food and eating ? - N  Using the Toilet? - N  In the past six months, have you accidently leaked urine? - N  Do you have problems with loss of bowel control? - N  Managing your Medications? - N  Managing your Finances? - N  Housekeeping or managing your Housekeeping? - N  Some recent data might be hidden    Fall/Depression Screening: Fall Risk  03/25/2017 03/22/2017 02/22/2017  Falls in the past year? No No No  Number falls in past yr: - -  -  Injury with Fall? - - -  Risk for fall due to : - - -  Risk for fall due to: Comment - - -  Follow up - - -   PHQ 2/9 Scores 03/22/2017 02/22/2017 02/21/2017 02/20/2017 01/21/2017 01/04/2017 12/31/2016  PHQ - 2 Score 0 4 4 1 1 3 6   PHQ- 9 Score 0 10 10 - - 11 16  Exception Documentation - - - - - - -    Assessment: Patient continues to benefit from health coach outreach for disease management and support.   THN CM Care Plan Problem One     Most Recent Value  THN CM Short Term Goal #1   Patient will report decrease in consuming sweets in next 30 days  THN CM Short Term Goal #1 Start Date  12/31/16  Interventions for Short Term Goal #1  RN discussed with the patient on how sweets will affect her blood sugars  THN CM Short Term Goal #2   In the Next 30 days the patient will be able to verbalize 1 way of dealing with stress  THN CM Short Term Goal #2 Start Date  02/20/17  Interventions for Short Term Goal #2  [place] will Talked with the patient copying skills  She seems to be doing better but I would like to continue to monitor her.    THN CM Short Term Goal #3  In the next 30 days the patient will write down questions for her next doctor, s appointment.  THN CM Short Term Goal #3 Start Date  02/20/17  Interventions for Short Tern Goal #3  [place] dicussed with the patient about presenting her physician with questions and wrtiting them down so she will not forget.     Plan: RN Health Coach will contact patient in the month of January and patient agrees to next outreach.  Lazaro Arms RN, BSN, [place] Direct Dial:  [phone] Fax: [phone]

## 2017-03-27 ENCOUNTER — Encounter: Payer: Self-pay | Admitting: Physical Therapy

## 2017-03-27 ENCOUNTER — Ambulatory Visit: Payer: Medicare Other | Attending: General Surgery | Admitting: Physical Therapy

## 2017-03-27 DIAGNOSIS — R600 Localized edema: Secondary | ICD-10-CM | POA: Insufficient documentation

## 2017-03-27 DIAGNOSIS — M6281 Muscle weakness (generalized): Secondary | ICD-10-CM

## 2017-03-27 DIAGNOSIS — M25511 Pain in right shoulder: Secondary | ICD-10-CM

## 2017-03-27 DIAGNOSIS — M25611 Stiffness of right shoulder, not elsewhere classified: Secondary | ICD-10-CM | POA: Insufficient documentation

## 2017-03-27 NOTE — Patient Instructions (Signed)
Shoulder: Flexion (Supine)    With hands shoulder width apart, slowly lower dowel to floor behind head. Do not let elbows bend. Keep back flat. Hold _10-15___ seconds. Repeat _10___ times. Do _1-2___ sessions per day. CAUTION: Stretch slowly and gently.  Copyright  VHI. All rights reserved.  Shoulder: Abduction (Supine)    With right arm flat on floor, hold dowel in palm. Slowly move arm up to side of head by pushing with opposite arm. Do not let elbow bend. Hold __10-15__ seconds. Repeat __10__ times. Do _1-2___ sessions per day. CAUTION: Stretch slowly and gently.  Copyright  VHI. All rights reserved.

## 2017-03-27 NOTE — Therapy (Signed)
[place], [place], [address] Phone: [phone]   Fax:  [phone]  Physical Therapy Evaluation  Patient Details  Name: Tracey Evans MRN: [id number] Date of Birth: 1946/02/22 Referring Provider: Dr. Dalbert Batman   Encounter Date: 03/27/2017  PT End of Session - 03/27/17 1519    Visit Number  1    Number of Visits  9    Date for PT Re-Evaluation  05/01/17    PT Start Time  4765    PT Stop Time  1515    PT Time Calculation (min)  37 min    Activity Tolerance  Patient tolerated treatment well    Behavior During Therapy  [facility] for tasks assessed/performed       Past Medical History:  Diagnosis Date  . Angina at rest [facility])    "occurs whenever it wants to, but worse during agitation"  . Anxiety   . Asthma   . Binge eating disorder   . Breast cancer of lower-outer quadrant of left female breast ([place]) 07/08/2015   "NEVER HAD LEFT BREAST CANCER" (08/15/2015)  . Cancer of right breast ([place]) 08/15/2015  . Chronic bronchitis ([place])   . Chronic lower back pain   . Complication of anesthesia    had bronc spasms during intubation surgery 2009 on foot-need albuterol inhaler or neb tx preop  . Costochondritis   . Depression   . Diverticular disease   . Fibromyalgia   . GERD (gastroesophageal reflux disease)   . Hot flashes   . Hyperlipidemia   . Hypertension   . Hypothyroidism   . Osteoarthritis    "all over"  . Peptic ulcer disease   . Personal history of chemotherapy   . Shortness of breath dyspnea   . Type II diabetes mellitus ([place])    "diet controlled" (08/15/2015)    Past Surgical History:  Procedure Laterality Date  . ABDOMINAL HYSTERECTOMY     "left my ovaries"  . ANKLE ARTHROSCOPY  01/02/2012   Procedure: ANKLE ARTHROSCOPY;  Surgeon: Colin Rhein, MD;  Location: [place];  Service: Orthopedics;  Laterality: Right;  right ankle arthroscopy with extensive debridement ,  dridement and drilling talar dome osteochondral lesion  . ANKLE FRACTURE SURGERY Right 2009   Dr. Rolena Infante  . BREAST BIOPSY  07/2015  . CARDIAC CATHETERIZATION    . CARPAL TUNNEL RELEASE Bilateral   . COLONOSCOPY    . DILATION AND CURETTAGE OF UTERUS     "before hysterectomy"  . FRACTURE SURGERY    . KNEE ARTHROSCOPY Bilateral   . LAPAROSCOPIC CHOLECYSTECTOMY    . MASTECTOMY COMPLETE / SIMPLE W/ SENTINEL NODE BIOPSY Right 08/15/2015  . MASTECTOMY W/ SENTINEL NODE BIOPSY Right 08/15/2015   Procedure: TOTAL MASTECTOMY WITH SENTINEL LYMPH NODE BIOPSY AND BLUE DYE INJECTION ;  Surgeon: Fanny Skates, MD;  Location: [facility];  Service: General;  Laterality: Right;  . PORT-A-CATH REMOVAL N/A 11/12/2016   Procedure: REMOVAL PORT-A-CATH;  Surgeon: Jackolyn Confer, MD;  Location: [facility];  Service: General;  Laterality: N/A;  . PORTACATH PLACEMENT Left 08/15/2015  . PORTACATH PLACEMENT Left 08/15/2015   Procedure: INSERTION PORT-A-CATH ;  Surgeon: Fanny Skates, MD;  Location: [place];  Service: General;  Laterality: Left;  . SHOULDER ARTHROSCOPY Bilateral 2009-2011   "bone spurs"  . TONSILLECTOMY      There were no vitals filed for this visit.   Subjective Assessment - 03/27/17 1447    Subjective  I  called my doctor because it was hurting so bad to lay on my right side. It hurts my right chest. He said I have decreased ROM in my right shoulder. I also have this snail under my skin ( area of puffiness at medial right mastectomy scar). The doctor wants me to get a CT scan to check on that area.     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  Took Arimidex until day of surgery on May 8th, 2017 and portacath was placed at left chest along with right mastectomy.  Fibromyalgia diagnosed 20 years  ago and says she manages it.  Has osteooarthitis "from head to feet."  Costochondritis in her right ribs x 3 years and has had nerve ablation for that.  Has had arthroscopic debridement of both shoulders 2010.  Both knee arthroscopic surgery in the past; fracture of right ankle about 2011 with ORIF, subsequently removed.  HTN controlled with meds.  Angina and usually remembers her nitroglycerin, but says it is controlled.  Will have injections 09/07/16 in her neck for the constochondritis. Abdominal swelling started when drains were removed; CT scan was done last month but it was negative.  She says that both total knees and total hips have been recommended to her.    Patient Stated Goals  to decrease pain to be able to sleep on right side    Currently in Pain?  No/denies    Pain Score  0-No pain         [facility] PT Assessment - 03/27/17 0001      Assessment   Medical Diagnosis  right breast cancer; right abdominal swelling    Referring Provider  Dr. Dalbert Batman    Onset Date/Surgical Date  08/15/15    Prior Therapy  underwent PT for abdominal swelling in 09/06/16      Precautions   Precautions  Other (comment)    Precaution Comments  at risk of lymphedema      Restrictions   Weight Bearing Restrictions  No      Balance Screen   Has the patient fallen in the past 6 months  No    Has the patient had a decrease in activity level because of a fear of falling?   No    Is the patient reluctant to leave their home because of a fear of falling?   No      Home Environment   Living Environment  Private residence    Living Arrangements  Alone    Available Help at Discharge  Family;Friend(s)    Type of [place] Access  Level entry    [place] - single point;Walker - 2 wheels;Grab bars - toilet      Prior Function   Level of Independence  Independent      Observation/Other Assessments   Observations  right abdomen appears a bit bigger than left,  more obvious with clothes pulled over it than without, right pec very tight with right shoulder abduction, small area of swelling at medial mastectomy scar      AROM   Overall AROM Comments  --    AROM Assessment Site  Shoulder    Right/Left Shoulder  Right;Left  Right Shoulder Flexion  155 Degrees    Right Shoulder ABduction  120 Degrees    Right Shoulder Internal Rotation  73 Degrees with pain in right chest    Right Shoulder External Rotation  90 Degrees    Left Shoulder Flexion  166 Degrees    Left Shoulder ABduction  171 Degrees    Left Shoulder Internal Rotation  69 Degrees    Left Shoulder External Rotation  90 Degrees      Palpation   Palpation comment  --        LYMPHEDEMA/ONCOLOGY QUESTIONNAIRE - 03/27/17 1504      Type   Cancer Type  right breast      Surgeries   Mastectomy Date  08/15/15    Number Lymph Nodes Removed  4 all negative      Treatment   Active Chemotherapy Treatment  No    Date  -- herceptin, finishes 09/28/16    Past Chemotherapy Treatment  Yes    Date  -- 5 months ago    Active Radiation Treatment  No    Past Radiation Treatment  No    Current Hormone Treatment  Yes    Drug Name  letrozole      What other symptoms do you have   Are you Having Heaviness or Tightness  Yes    Are you having Pain  Yes    Are you having pitting edema  No    Is it Hard or Difficult finding clothes that fit  No    Do you have infections  No    Is there Decreased scar mobility  Yes      Lymphedema Assessments   Lymphedema Assessments  Upper extremities      Right Upper Extremity Lymphedema   Other  --    Other  --    Other  --      Left Upper Extremity Lymphedema   Other  --        Katina Dung - 03/27/17 0001    Open a tight or new jar  Mild difficulty    Do heavy household chores (wash walls, wash floors)  No difficulty    Carry a shopping bag or briefcase  No difficulty    Wash your back  Unable    Use a knife to cut food  No difficulty     Recreational activities in which you take some force or impact through your arm, shoulder, or hand (golf, hammering, tennis)  Unable    During the past week, to what extent has your arm, shoulder or hand problem interfered with your normal social activities with family, friends, neighbors, or groups?  Slightly    During the past week, to what extent has your arm, shoulder or hand problem limited your work or other regular daily activities  Not at all    Arm, shoulder, or hand pain.  Moderate    Tingling (pins and needles) in your arm, shoulder, or hand  Severe    Difficulty Sleeping  Moderate difficulty    DASH Score  38.64 %       Objective measurements completed on examination: See above findings.      [facility] Adult PT Treatment/Exercise - 03/27/17 0001      Shoulder Exercises: Supine   Flexion  AAROM;Both;5 reps with dowel with 5 sec holds    ABduction  AAROM;Right 3 reps with dowel             PT  Long Term Goals - 03/27/17 1528      PT LONG TERM GOAL #1   Title  Pt will demonstrate 166 degrees of right shoulder flexion to allow her to reach items overhead    Baseline  155    Time  4    Period  Weeks    Status  New    Target Date  05/01/17      PT LONG TERM GOAL #2   Title  Pt will demonstrate 165 degrees of right shoulder abduction to allow her to reach out to sides without pain    Baseline  120    Time  4    Period  Weeks    Status  New    Target Date  05/01/17      PT LONG TERM GOAL #3   Title  Pt to be independent in a home exercise program for continued strengthening and stretching    Time  4    Period  Weeks    Status  New    Target Date  05/01/17      PT LONG TERM GOAL #4   Title  Pt to report a 50% decrease in area of fullness at right medial mastectomy scar    Time  4    Period  Weeks    Status  New    Target Date  05/01/17      PT LONG TERM GOAL #5   Title  Pt to report a 75% improvement in pain in right chest with right shoulder abduction to  allow improved comfort.    Time  4    Period  Weeks    Status  New    Target Date  05/01/17          Plan - 03/27/17 1520    Clinical Impression Statement  Pt presents to PT with decreased right shoulder ROM and pain across R chest with R shoulder abduction. She underwent a R mastectomy in 2017 and completed chemotherapy. She has been seen at this clinic previously for abdominal swelling that pt reports is still a problem. She states nothing has helped that. In additon to decreased right shoulder ROM, pt also presents with some possible swelling at medial right mastectomy scar that appeared recently. Pt would benefit from skilled PT services to increase right shoulder ROM and strength, decrease right pec tightness, instruct pt in a home exercise program and address slight swelling at medial scar.     History and Personal Factors relevant to plan of care:  fibromyalgia, arthritis, back pain, needs bilateral knee surgeries, osteochondritis in R ribs    Clinical Presentation  Evolving    Clinical Presentation due to:  pt to undergo arthroscopic surgery for knees in Jan, also cataract surgery in Jan    Clinical Decision Making  Moderate    Rehab Potential  Good    Clinical Impairments Affecting Rehab Potential  pain in knees and back, upcoming surgeries for knees and eyes    PT Frequency  2x / week    PT Duration  4 weeks    PT Treatment/Interventions  ADLs/Self Care Home Management;Therapeutic exercise;Therapeutic activities;Patient/family education;Manual techniques;Manual lymph drainage;Scar mobilization;Passive range of motion    PT Next Visit Plan  begin myofacial and STM to R pec, R shoulder P/A/AAROM    PT Home Exercise Plan  supine dowel exercises    Consulted and Agree with Plan of Care  Patient       Patient will benefit  from skilled therapeutic intervention in order to improve the following deficits and impairments:  Increased edema, Decreased knowledge of precautions, Decreased  range of motion, Decreased strength, Pain, Increased fascial restricitons, Decreased scar mobility  Visit Diagnosis: Stiffness of right shoulder, not elsewhere classified - Plan: PT plan of care cert/re-cert  Acute pain of right shoulder - Plan: PT plan of care cert/re-cert  Localized edema - Plan: PT plan of care cert/re-cert  Muscle weakness (generalized) - Plan: PT plan of care cert/re-cert  G-Codes - 16/10/96 1527    Functional Assessment Tool Used (Outpatient Only)  Quick Dash    Functional Limitation  Carrying, moving and handling objects    Carrying, Moving and Handling Objects Current Status (E4540)  At least 20 percent but less than 40 percent impaired, limited or restricted    Carrying, Moving and Handling Objects Goal Status (J8119)  At least 1 percent but less than 20 percent impaired, limited or restricted        Problem List Patient Active Problem List   Diagnosis Date Noted  . Shingles 02/14/2016  . Anemia associated with nutritional deficiency 01/13/2016  . Infection of nail bed of finger 12/22/2015  . Syncope 10/05/2015  . Genetic testing 08/21/2015  . Family history of breast cancer in female 07/15/2015  . Family history of colon cancer 07/15/2015  . Breast cancer of lower-outer quadrant of right female breast ([place]) 07/08/2015  . Disturbance in sleep behavior 11/18/2014  . Fibromyalgia 10/04/2014  . Flu-like symptoms 07/09/2014  . Diverticulitis of colon (without mention of hemorrhage)(562.11) 12/01/2013  . UTI (urinary tract infection) 12/01/2013  . Abdominal pain, unspecified site 12/01/2013  . Dry mouth 04/29/2013  . Dry eye 04/29/2013  . Dry skin 04/29/2013  . Obesity (BMI 30-39.9) 03/11/2013  . Grief 10/17/2012  . Edema 09/04/2012  . Depression 09/04/2012  . Chemotherapy-induced peripheral neuropathy ([place]) 05/12/2012  . OAB (overactive bladder) 04/01/2012  . DM w/o complication type II ([place]) 02/06/2012  . Colitis 01/21/2012  . Allergy to dogs  04/19/2011  . Polyarthralgia 09/18/2010  . STRESS INCONTINENCE 04/25/2010  . ASTHMA 04/11/2010  . VERTIGO 04/11/2010  . Hypothyroidism 01/19/2010  . Hyperlipidemia associated with type 2 diabetes mellitus ([place]) 01/19/2010  . Essential hypertension 01/19/2010  . GERD 01/19/2010  . PEPTIC ULCER DISEASE 01/19/2010  . OSTEOARTHRITIS 01/19/2010  . LOW BACK PAIN 01/19/2010    Allyson Sabal [facility] 03/27/2017, 3:33 PM  [place], [place], [address] Phone: [phone]   Fax:  [phone]  Name: ZAYAH KEILMAN MRN: [id number] Date of Birth: 05/13/1945  Allyson Sabal [place], PT 03/27/17 3:33 PM

## 2017-03-28 ENCOUNTER — Ambulatory Visit: Payer: Medicare Other | Admitting: Physical Therapy

## 2017-03-28 ENCOUNTER — Other Ambulatory Visit: Payer: Self-pay | Admitting: Pharmacist

## 2017-03-29 ENCOUNTER — Other Ambulatory Visit: Payer: Self-pay | Admitting: General Surgery

## 2017-03-29 DIAGNOSIS — C50111 Malignant neoplasm of central portion of right female breast: Secondary | ICD-10-CM

## 2017-03-29 NOTE — Patient Outreach (Signed)
[place] [facility]) Care Management  03/29/2017  CAREEN MAUCH 1946-04-04 [id number]   Patient was called to follow up on getting 90 day supplies of all of her medications.  HIPAA identifiers were obtained.  Patient is a 71 year old female with multiple medical conditions including but not limited to:  hypothyroidism, hyperlipidemia, Osteoarthritis, back pain, GERD, Fibromyalgia, depression, dry eyes, asthma, anemia, and history of breast cancer (2017) --status post mastectomy.   Patient's medications were reviewed via telephone:  Medications Reviewed Today    Reviewed by Elayne Guerin, [facility] (Pharmacist) on 03/29/17 at 0950  Med List Status: <None>  Medication Order Taking? Sig Documenting Provider Last Dose Status Informant  ALPRAZolam (XANAX) 1 MG tablet [id number] Yes TAKE 1 TABLET BY MOUTH TWICE A DAY AS NEEDED Midge Minium, MD Taking Active            Med Note Regino Bellow Mar 28, 2017  2:23 PM) Patient takes 1/2 tablet  amLODipine (NORVASC) 2.5 MG tablet [id number] Yes Take 1 tablet (2.5 mg total) by mouth daily. Midge Minium, MD Taking Active   aspirin 81 MG tablet [id number] Yes Take 81 mg by mouth daily. [provider] Taking Active   atorvastatin (LIPITOR) 20 MG tablet [id number] Yes TAKE 1 TABLET BY MOUTH EVERY DAY IN THE EVENING Midge Minium, MD Taking Active   buPROPion (WELLBUTRIN XL) 300 MG 24 hr tablet [id number] Yes Take 1 tablet (300 mg total) by mouth daily. Midge Minium, MD Taking Active   citalopram (CELEXA) 10 MG tablet [id number] Yes Take 1 tablet (10 mg total) by mouth daily. Midge Minium, MD Taking Active            Med Note [name], Johnna Acosta Mar 25, 2017 10:58 AM) The patient states that she has been increased to 20 mg.  diphenhydrAMINE (BENADRYL) 50 MG tablet [id number] Yes Take one tablet (50mg s total) one hour prior to study. Nicholas Lose, MD Taking Active            Med Note Corinna Lines Dec 31, 2016 12:53 PM) PRN Only before studies   letrozole [facility]) 2.5 MG tablet [id number] Yes Take 1 tablet (2.5 mg total) by mouth daily. Nicholas Lose, MD Taking Active   levothyroxine (SYNTHROID, LEVOTHROID) 100 MCG tablet [id number] Yes Take 1 tablet (100 mcg total) by mouth daily. Midge Minium, MD Taking Active   meloxicam [facility]) 15 MG tablet [id number] Yes Take 15 mg by mouth daily.  [provider] Taking Active Self           Med Note ([place]   Tue Aug 09, 2015  1:50 PM)    nitroGLYCERIN (NITROSTAT) 0.4 MG SL tablet [id number] Yes Place 1 tablet (0.4 mg total) under the tongue every 5 (five) minutes as needed. As needed for chest pain Midge Minium, MD Taking Active Self  Polyethyl Glycol-Propyl Glycol (SYSTANE ULTRA) 0.4-0.3 % SOLN [id number] Yes Place 1 drop into both eyes 3 (three) times daily as needed (for dry eyes).  [provider] Taking Active Self  PROAIR HFA 108 [id number] Base) MCG/ACT inhaler [id number] Yes INHALE 2 PUFFS INTO THE LUNGS EVERY 6 (SIX) HOURS AS NEEDED FOR WHEEZING. Midge Minium, MD Taking Active   ranitidine (ZANTAC) 150 MG tablet [id number] Yes Take 150 mg by mouth 2 (two) times daily as needed for heartburn. Reported on 10/05/2015 [provider] Taking  Active Self           Med Note Meda Coffee, LISA L   Tue Aug 09, 2015  1:51 PM)    tiZANidine (ZANAFLEX) 4 MG tablet [id number] Yes TAKE 1 TABLET BY MOUTH EVERY 8 HOURS AS NEEDED FOR SPASM [provider] Taking Active            Med Note Corinna Lines Dec 31, 2016 12:57 PM)            Medication Review Findings:  -90 supplies were sent of all of the patient's medications with the exception of Levothyroxine (the dose was just titrated) and Alprazolam (controlled)  Dose Discreapancy- Citalopram 10mg -patient said she is taking 2 Citalopram tablets daily (20mg ) and has been for quite some time.  Dose clarification is needed. If patient should truly  be taking Citalopram 20mg , a new prescription needs to be sent to the pharmacy for a 90 day supply. As the prescription is written, the patient will run out of Citalopram before 90 days and the insurance will not want to pay for it because it will think the prescription is too early.  Plan: Route note to the patient's PCP Call patient back in 14-21 days, Close patient's case  Elayne Guerin, PharmD, [place] Clinical Pharmacist [phone]

## 2017-04-01 ENCOUNTER — Other Ambulatory Visit: Payer: Self-pay | Admitting: *Deleted

## 2017-04-01 NOTE — Patient Outreach (Signed)
[place] [facility]) Care Management  04/01/2017  YAMEL BALE 05/04/1945 [id number]   CSW made an attempt to try and contact patient today to follow-up regarding social work services and resources; however, patient was unavailable at the time of CSW's call.  A HIPAA compliant message was left for patient on voicemail and CSW is currently awaiting a return call.  CSW will make a second outreach attempt within the next week, if a return call is not received from patient in the meantime. Nat Christen, BSW, MSW, LCSW  Licensed Education officer, [facility]  Mailing [place] [address] [place], [place] [address] Physical Address-[address], [place], [place] [address] Toll Free Main # [phone] Fax # [phone] Cell # [phone]  Office # [phone] [email or web address]

## 2017-04-03 ENCOUNTER — Other Ambulatory Visit: Payer: Self-pay | Admitting: *Deleted

## 2017-04-03 NOTE — Patient Outreach (Signed)
[name] [facility]) Care Management  04/03/2017  Tracey Evans 1945-07-20 [id number]   CSW was able to make contact with patient today to follow-up regarding social work services and resources, as well as assess need for continued social work involvement.  Patient admits that she is doing well, reading materials provided to her by CSW and utilizing relaxation techniques when she notices herself becoming anxious.  Patient reported, "My depression has lifted and I am feeling much better".  Patient is very involved with her church, currently working on [employer] that is consuming a great deal of her time.  Patient also sews, as a form of therapy and relaxation.  Patient indicated that her sleep pattern is better regulated and that her thyroid medication has been increased, which is helping her to feel better and have more energy.  CSW was able to ensure that patient has the correct contact information for CSW, encouraging patient to contact CSW directly if additional social work needs arise in the near future. CSW will perform a case closure on patient, as all goals of treatment have been met from social work standpoint and no additional social work needs have been identified at this time.  CSW will notify patient's [place] with [facility] [facility], [place] of CSW's plans to close patient's case.  CSW will fax an update to patient's Primary Care Physician, Dr. Annye Asa to ensure that they are aware of CSW's involvement with patient's plan of care.  CSW will submit a case closure request to Alycia Rossetti, Care Management Assistant with [facility], in the form of an [employer].  CSW will ensure that Mrs. Arelia Sneddon is aware of Mrs. [name], RNCM with [facility] [facility], continued involvement with patient's care. Nat Christen, BSW, MSW, LCSW  Licensed Engineer, [facility]  Mailing [place] [address] [place], [place] [address] Physical Address-[address], [place], [place] [address] Toll Free Main # [phone] Fax # [phone] Cell # [phone]  Office # [phone] [email or web address]

## 2017-04-04 ENCOUNTER — Other Ambulatory Visit: Payer: Self-pay | Admitting: General Surgery

## 2017-04-05 DIAGNOSIS — E039 Hypothyroidism, unspecified: Secondary | ICD-10-CM | POA: Diagnosis not present

## 2017-04-05 DIAGNOSIS — I2510 Atherosclerotic heart disease of native coronary artery without angina pectoris: Secondary | ICD-10-CM | POA: Diagnosis not present

## 2017-04-05 DIAGNOSIS — M797 Fibromyalgia: Secondary | ICD-10-CM | POA: Diagnosis not present

## 2017-04-05 DIAGNOSIS — I10 Essential (primary) hypertension: Secondary | ICD-10-CM | POA: Diagnosis not present

## 2017-04-05 DIAGNOSIS — E785 Hyperlipidemia, unspecified: Secondary | ICD-10-CM | POA: Diagnosis not present

## 2017-04-08 ENCOUNTER — Encounter: Payer: Self-pay | Admitting: Family Medicine

## 2017-04-09 ENCOUNTER — Other Ambulatory Visit: Payer: Self-pay | Admitting: Family Medicine

## 2017-04-10 ENCOUNTER — Encounter: Payer: Self-pay | Admitting: Physical Therapy

## 2017-04-10 ENCOUNTER — Ambulatory Visit: Payer: Medicare Other | Attending: General Surgery | Admitting: Physical Therapy

## 2017-04-10 DIAGNOSIS — M25511 Pain in right shoulder: Secondary | ICD-10-CM | POA: Insufficient documentation

## 2017-04-10 DIAGNOSIS — M25611 Stiffness of right shoulder, not elsewhere classified: Secondary | ICD-10-CM | POA: Diagnosis not present

## 2017-04-10 NOTE — Telephone Encounter (Signed)
Last OV 03/22/17 Alprazolam last filled 01/29/17 #60 with 1

## 2017-04-10 NOTE — Telephone Encounter (Signed)
Medication filled to pharmacy as requested.   

## 2017-04-10 NOTE — Therapy (Signed)
[place], [place], [address] Phone: [phone]   Fax:  [phone]  Physical Therapy Treatment  Patient Details  Name: Tracey Evans MRN: [id number] Date of Birth: 03-11-1946 Referring Provider: Dr. Dalbert Batman   Encounter Date: 04/10/2017  PT End of Session - 04/10/17 1708    Visit Number  2    Number of Visits  9    Date for PT Re-Evaluation  05/01/17    PT Start Time  9767    PT Stop Time  3419    PT Time Calculation (min)  43 min    Activity Tolerance  Patient tolerated treatment well    Behavior During Therapy  [facility] for tasks assessed/performed       Past Medical History:  Diagnosis Date  . Angina at rest [facility])    "occurs whenever it wants to, but worse during agitation"  . Anxiety   . Asthma   . Binge eating disorder   . Breast cancer of lower-outer quadrant of left female breast ([place]) 07/08/2015   "NEVER HAD LEFT BREAST CANCER" (08/15/2015)  . Cancer of right breast ([place]) 08/15/2015  . Chronic bronchitis ([place])   . Chronic lower back pain   . Complication of anesthesia    had bronc spasms during intubation surgery 2009 on foot-need albuterol inhaler or neb tx preop  . Costochondritis   . Depression   . Diverticular disease   . Fibromyalgia   . GERD (gastroesophageal reflux disease)   . Hot flashes   . Hyperlipidemia   . Hypertension   . Hypothyroidism   . Osteoarthritis    "all over"  . Peptic ulcer disease   . Personal history of chemotherapy   . Shortness of breath dyspnea   . Type II diabetes mellitus ([place])    "diet controlled" (08/15/2015)    Past Surgical History:  Procedure Laterality Date  . ABDOMINAL HYSTERECTOMY     "left my ovaries"  . ANKLE ARTHROSCOPY  01/02/2012   Procedure: ANKLE ARTHROSCOPY;  Surgeon: Colin Rhein, MD;  Location: [place];  Service: Orthopedics;  Laterality: Right;  right ankle arthroscopy with extensive debridement , dridement  and drilling talar dome osteochondral lesion  . ANKLE FRACTURE SURGERY Right 2009   Dr. Rolena Infante  . BREAST BIOPSY  07/2015  . CARDIAC CATHETERIZATION    . CARPAL TUNNEL RELEASE Bilateral   . COLONOSCOPY    . DILATION AND CURETTAGE OF UTERUS     "before hysterectomy"  . FRACTURE SURGERY    . KNEE ARTHROSCOPY Bilateral   . LAPAROSCOPIC CHOLECYSTECTOMY    . MASTECTOMY COMPLETE / SIMPLE W/ SENTINEL NODE BIOPSY Right 08/15/2015  . MASTECTOMY W/ SENTINEL NODE BIOPSY Right 08/15/2015   Procedure: TOTAL MASTECTOMY WITH SENTINEL LYMPH NODE BIOPSY AND BLUE DYE INJECTION ;  Surgeon: Fanny Skates, MD;  Location: [place];  Service: General;  Laterality: Right;  . PORT-A-CATH REMOVAL N/A 11/12/2016   Procedure: REMOVAL PORT-A-CATH;  Surgeon: Jackolyn Confer, MD;  Location: [facility];  Service: General;  Laterality: N/A;  . PORTACATH PLACEMENT Left 08/15/2015  . PORTACATH PLACEMENT Left 08/15/2015   Procedure: INSERTION PORT-A-CATH ;  Surgeon: Fanny Skates, MD;  Location: [place];  Service: General;  Laterality: Left;  . SHOULDER ARTHROSCOPY Bilateral 2009-2011   "bone spurs"  . TONSILLECTOMY      There were no vitals filed for this visit.  Subjective Assessment - 04/10/17 1525    Subjective  Ever since  my doctor increased my thyroid medicine I have had headaches and been jittery. I have had a lot of pain in my knees. My right knee was as big as a grapefruit. My chest is the same old same old.     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  Took Arimidex until day of surgery on May 8th, 2017 and portacath was placed at left chest along with right mastectomy.  Fibromyalgia diagnosed 20 years ago and says she manages it.  Has osteooarthitis "from head to feet."  Costochondritis in her right ribs x  3 years and has had nerve ablation for that.  Has had arthroscopic debridement of both shoulders 2010.  Both knee arthroscopic surgery in the past; fracture of right ankle about 2011 with ORIF, subsequently removed.  HTN controlled with meds.  Angina and usually remembers her nitroglycerin, but says it is controlled.  Will have injections 09/07/16 in her neck for the constochondritis. Abdominal swelling started when drains were removed; CT scan was done last month but it was negative.  She says that both total knees and total hips have been recommended to her.    Patient Stated Goals  to decrease pain to be able to sleep on right side    Currently in Pain?  Yes    Pain Score  10-Worst pain ever    Pain Location  Back    Pain Orientation  Upper;Left    Pain Descriptors / Indicators  -- pulling    Pain Type  Chronic pain                      [facility] Adult PT Treatment/Exercise - 04/10/17 0001      Manual Therapy   Manual Therapy  Soft tissue mobilization    Soft tissue mobilization  to right pec in area of tightness with release noted, also to right upper traps, levator and rhomboids where pt was having pain when raising arm overhead                  PT Long Term Goals - 03/27/17 1528      PT LONG TERM GOAL #1   Title  Pt will demonstrate 166 degrees of right shoulder flexion to allow her to reach items overhead    Baseline  155    Time  4    Period  Weeks    Status  New    Target Date  05/01/17      PT LONG TERM GOAL #2   Title  Pt will demonstrate 165 degrees of right shoulder abduction to allow her to reach out to sides without pain    Baseline  120    Time  4    Period  Weeks    Status  New    Target Date  05/01/17      PT LONG TERM GOAL #3   Title  Pt to be independent in a home exercise program for continued strengthening and stretching    Time  4    Period  Weeks    Status  New    Target Date  05/01/17      PT LONG TERM GOAL #4   Title  Pt to  report a 50% decrease in area  of fullness at right medial mastectomy scar    Time  4    Period  Weeks    Status  New    Target Date  05/01/17      PT LONG TERM GOAL #5   Title  Pt to report a 75% improvement in pain in right chest with right shoulder abduction to allow improved comfort.    Time  4    Period  Weeks    Status  New    Target Date  05/01/17            Plan - 04/10/17 1709    Clinical Impression Statement  Focused on soft tissue mobilization to area of tightness and pain in right pec. This area softened greatly and pt reported increased comfort at end of session. Also pt had been having pain while doing her exercises in her right upper shoulder. Palpable rope like texture noted in upper traps. Performed soft tissue mobilization to right upper traps, levator scapula and rhomboids with release noted.     Rehab Potential  Good    Clinical Impairments Affecting Rehab Potential  pain in knees and back, upcoming surgeries for knees and eyes    PT Frequency  2x / week    PT Duration  4 weeks    PT Treatment/Interventions  ADLs/Self Care Home Management;Therapeutic exercise;Therapeutic activities;Patient/family education;Manual techniques;Manual lymph drainage;Scar mobilization;Passive range of motion    PT Next Visit Plan  continue myofacial and STM to R pec, R shoulder P/A/AAROM    PT Home Exercise Plan  supine dowel exercises    Consulted and Agree with Plan of Care  Patient       Patient will benefit from skilled therapeutic intervention in order to improve the following deficits and impairments:  Increased edema, Decreased knowledge of precautions, Decreased range of motion, Decreased strength, Pain, Increased fascial restricitons, Decreased scar mobility  Visit Diagnosis: Stiffness of right shoulder, not elsewhere classified  Acute pain of right shoulder     Problem List Patient Active Problem List   Diagnosis Date Noted  . Shingles 02/14/2016  . Anemia  associated with nutritional deficiency 01/13/2016  . Infection of nail bed of finger 12/22/2015  . Syncope 10/05/2015  . Genetic testing 08/21/2015  . Family history of breast cancer in female 07/15/2015  . Family history of colon cancer 07/15/2015  . Breast cancer of lower-outer quadrant of right female breast ([place]) 07/08/2015  . Disturbance in sleep behavior 11/18/2014  . Fibromyalgia 10/04/2014  . Flu-like symptoms 07/09/2014  . Diverticulitis of colon (without mention of hemorrhage)(562.11) 12/01/2013  . UTI (urinary tract infection) 12/01/2013  . Abdominal pain, unspecified site 12/01/2013  . Dry mouth 04/29/2013  . Dry eye 04/29/2013  . Dry skin 04/29/2013  . Obesity (BMI 30-39.9) 03/11/2013  . Grief 10/17/2012  . Edema 09/04/2012  . Depression 09/04/2012  . Chemotherapy-induced peripheral neuropathy ([place]) 05/12/2012  . OAB (overactive bladder) 04/01/2012  . DM w/o complication type II ([place]) 02/06/2012  . Colitis 01/21/2012  . Allergy to dogs 04/19/2011  . Polyarthralgia 09/18/2010  . STRESS INCONTINENCE 04/25/2010  . ASTHMA 04/11/2010  . VERTIGO 04/11/2010  . Hypothyroidism 01/19/2010  . Hyperlipidemia associated with type 2 diabetes mellitus ([place]) 01/19/2010  . Essential hypertension 01/19/2010  . GERD 01/19/2010  . PEPTIC ULCER DISEASE 01/19/2010  . OSTEOARTHRITIS 01/19/2010  . LOW BACK PAIN 01/19/2010    Allyson Sabal [facility] 04/10/2017, 5:11 PM  [place]  [place], [place], [address] Phone: [phone]   Fax:  [phone]  Name: Tracey Evans MRN: [id number] Date of Birth: 25-Nov-1945  Manus Gunning, PT 04/10/17 5:11 PM

## 2017-04-12 ENCOUNTER — Other Ambulatory Visit (HOSPITAL_COMMUNITY): Payer: Self-pay | Admitting: General Surgery

## 2017-04-12 DIAGNOSIS — C50111 Malignant neoplasm of central portion of right female breast: Secondary | ICD-10-CM

## 2017-04-12 DIAGNOSIS — C50112 Malignant neoplasm of central portion of left female breast: Principal | ICD-10-CM

## 2017-04-15 DIAGNOSIS — H25812 Combined forms of age-related cataract, left eye: Secondary | ICD-10-CM | POA: Diagnosis not present

## 2017-04-15 DIAGNOSIS — H2512 Age-related nuclear cataract, left eye: Secondary | ICD-10-CM | POA: Diagnosis not present

## 2017-04-15 DIAGNOSIS — H25813 Combined forms of age-related cataract, bilateral: Secondary | ICD-10-CM | POA: Diagnosis not present

## 2017-04-16 DIAGNOSIS — H25813 Combined forms of age-related cataract, bilateral: Secondary | ICD-10-CM | POA: Diagnosis not present

## 2017-04-17 ENCOUNTER — Ambulatory Visit: Payer: Medicare Other | Admitting: Physical Therapy

## 2017-04-17 ENCOUNTER — Encounter: Payer: Self-pay | Admitting: Physical Therapy

## 2017-04-17 DIAGNOSIS — M25611 Stiffness of right shoulder, not elsewhere classified: Secondary | ICD-10-CM

## 2017-04-17 DIAGNOSIS — M25511 Pain in right shoulder: Secondary | ICD-10-CM

## 2017-04-17 NOTE — Therapy (Signed)
[place], [place], [address] Phone: [phone]   Fax:  [phone]  Physical Therapy Evaluation  Patient Details  Name: MATTILYN CRITES MRN: [id number] Date of Birth: [birth year] Referring Provider: Dr. Dalbert Batman   Encounter Date: 04/17/2017  PT End of Session - 04/17/17 1143    Visit Number  3    Number of Visits  9    Date for PT Re-Evaluation  05/01/17    PT Start Time  1112    PT Stop Time  1153    PT Time Calculation (min)  41 min    Activity Tolerance  Patient tolerated treatment well    Behavior During Therapy  [facility] for tasks assessed/performed       Past Medical History:  Diagnosis Date  . Angina at rest [facility])    "occurs whenever it wants to, but worse during agitation"  . Anxiety   . Asthma   . Binge eating disorder   . Breast cancer of lower-outer quadrant of left female breast ([place]) 07/08/2015   "NEVER HAD LEFT BREAST CANCER" (08/15/2015)  . Cancer of right breast ([place]) 08/15/2015  . Chronic bronchitis ([place])   . Chronic lower back pain   . Complication of anesthesia    had bronc spasms during intubation surgery 2009 on foot-need albuterol inhaler or neb tx preop  . Costochondritis   . Depression   . Diverticular disease   . Fibromyalgia   . GERD (gastroesophageal reflux disease)   . Hot flashes   . Hyperlipidemia   . Hypertension   . Hypothyroidism   . Osteoarthritis    "all over"  . Peptic ulcer disease   . Personal history of chemotherapy   . Shortness of breath dyspnea   . Type II diabetes mellitus ([place])    "diet controlled" (08/15/2015)    Past Surgical History:  Procedure Laterality Date  . ABDOMINAL HYSTERECTOMY     "left my ovaries"  . ANKLE ARTHROSCOPY  01/02/2012   Procedure: ANKLE ARTHROSCOPY;  Surgeon: Colin Rhein, MD;  Location: [place];  Service: Orthopedics;  Laterality: Right;  right ankle arthroscopy with extensive debridement , dridement  and drilling talar dome osteochondral lesion  . ANKLE FRACTURE SURGERY Right 2009   Dr. Rolena Infante  . BREAST BIOPSY  07/2015  . CARDIAC CATHETERIZATION    . CARPAL TUNNEL RELEASE Bilateral   . COLONOSCOPY    . DILATION AND CURETTAGE OF UTERUS     "before hysterectomy"  . FRACTURE SURGERY    . KNEE ARTHROSCOPY Bilateral   . LAPAROSCOPIC CHOLECYSTECTOMY    . MASTECTOMY COMPLETE / SIMPLE W/ SENTINEL NODE BIOPSY Right 08/15/2015  . MASTECTOMY W/ SENTINEL NODE BIOPSY Right 08/15/2015   Procedure: TOTAL MASTECTOMY WITH SENTINEL LYMPH NODE BIOPSY AND BLUE DYE INJECTION ;  Surgeon: Fanny Skates, MD;  Location: [place];  Service: General;  Laterality: Right;  . PORT-A-CATH REMOVAL N/A 11/12/2016   Procedure: REMOVAL PORT-A-CATH;  Surgeon: Jackolyn Confer, MD;  Location: [facility];  Service: General;  Laterality: N/A;  . PORTACATH PLACEMENT Left 08/15/2015  . PORTACATH PLACEMENT Left 08/15/2015   Procedure: INSERTION PORT-A-CATH ;  Surgeon: Fanny Skates, MD;  Location: [place];  Service: General;  Laterality: Left;  . SHOULDER ARTHROSCOPY Bilateral 2009-2011   "bone spurs"  . TONSILLECTOMY      There were no vitals filed for this visit.   Subjective Assessment - 04/17/17 1113    Subjective  Can  we see if I am ready for discharge? I have been doing massage and I do not feel any pain anymore.     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  Took Arimidex until day of surgery on May 8th, 2017 and portacath was placed at left chest along with right mastectomy.  Fibromyalgia diagnosed 20 years ago and says she manages it.  Has osteooarthitis "from head to feet."  Costochondritis in her right ribs x 3 years and has had nerve ablation for that.  Has had arthroscopic debridement of both shoulders  2010.  Both knee arthroscopic surgery in the past; fracture of right ankle about 2011 with ORIF, subsequently removed.  HTN controlled with meds.  Angina and usually remembers her nitroglycerin, but says it is controlled.  Will have injections 09/07/16 in her neck for the constochondritis. Abdominal swelling started when drains were removed; CT scan was done last month but it was negative.  She says that both total knees and total hips have been recommended to her.    Patient Stated Goals  to decrease pain to be able to sleep on right side    Currently in Pain?  Yes    Pain Score  6     Pain Location  Back    Pain Orientation  Upper;Left    Pain Type  Chronic pain    Pain Score  10    Pain Location  Eye    Pain Orientation  Left    Pain Descriptors / Indicators  Pins and needles    Pain Type  Surgical pain                 Objective measurements completed on examination: See above findings.      [facility] Adult PT Treatment/Exercise - 04/17/17 0001      Manual Therapy   Soft tissue mobilization  to right pec in area of tightness though most tightness gone since last session                  PT Long Term Goals - 04/17/17 1115      PT LONG TERM GOAL #1   Title  Pt will demonstrate 166 degrees of right shoulder flexion to allow her to reach items overhead  (Pended)     Baseline  155, 04/17/17- 169  (Pended)     Time  4  (Pended)     Period  Weeks  (Pended)     Status  Achieved  (Pended)       PT LONG TERM GOAL #2   Title  Pt will demonstrate 165 degrees of right shoulder abduction to allow her to reach out to sides without pain  (Pended)     Baseline  120, 04/17/17- 168  (Pended)     Time  4  (Pended)     Period  Weeks  (Pended)     Status  Achieved  (Pended)       PT LONG TERM GOAL #3   Title  Pt to be independent in a home exercise program for continued strengthening and stretching  (Pended)     Time  4  (Pended)     Period  Weeks  (Pended)     Status  Achieved   (Pended)  PT LONG TERM GOAL #4   Title  Pt to report a 50% decrease in area of fullness at right medial mastectomy scar  (Pended)     Baseline  04/17/17- Pt reports this hasn't changed but pt is going to have an MRI  (Pended)     Time  4  (Pended)     Period  Weeks  (Pended)     Status  Not Met  (Pended)       PT LONG TERM GOAL #5   Title  Pt to report a 75% improvement in pain in right chest with right shoulder abduction to allow improved comfort.  (Pended)     Baseline  04/17/17- 1005  (Pended)     Time  4  (Pended)     Period  Weeks  (Pended)              Plan - 04/17/17 1143    Clinical Impression Statement  Pt no longer has any pain in her right chest and has obtained full range of motion of her right shoulder. She still has some puffiness at medial mastectomy scar but is going to have an MRI scan of this area next week. Pt is ready for discharge from PT at this time due to meeting stated rehab goals. She is independent with her home exercises and has been able to perform self massage to decrease tightness in right chest.     Rehab Potential  Good    Clinical Impairments Affecting Rehab Potential  pain in knees and back, upcoming surgeries for knees and eyes    PT Frequency  2x / week    PT Duration  4 weeks    PT Treatment/Interventions  ADLs/Self Care Home Management;Therapeutic exercise;Therapeutic activities;Patient/family education;Manual techniques;Manual lymph drainage;Scar mobilization;Passive range of motion    PT Next Visit Plan  dc this visit    PT Home Exercise Plan  supine dowel exercises    Consulted and Agree with Plan of Care  Patient       Patient will benefit from skilled therapeutic intervention in order to improve the following deficits and impairments:  Increased edema, Decreased knowledge of precautions, Decreased range of motion, Decreased strength, Pain, Increased fascial restricitons, Decreased scar mobility  Visit Diagnosis: Stiffness of right  shoulder, not elsewhere classified  Acute pain of right shoulder     Problem List Patient Active Problem List   Diagnosis Date Noted  . Shingles 02/14/2016  . Anemia associated with nutritional deficiency 01/13/2016  . Infection of nail bed of finger 12/22/2015  . Syncope 10/05/2015  . Genetic testing 08/21/2015  . Family history of breast cancer in female 07/15/2015  . Family history of colon cancer 07/15/2015  . Breast cancer of lower-outer quadrant of right female breast ([place]) 07/08/2015  . Disturbance in sleep behavior 11/18/2014  . Fibromyalgia 10/04/2014  . Flu-like symptoms 07/09/2014  . Diverticulitis of colon (without mention of hemorrhage)(562.11) 12/01/2013  . UTI (urinary tract infection) 12/01/2013  . Abdominal pain, unspecified site 12/01/2013  . Dry mouth 04/29/2013  . Dry eye 04/29/2013  . Dry skin 04/29/2013  . Obesity (BMI 30-39.9) 03/11/2013  . Grief 10/17/2012  . Edema 09/04/2012  . Depression 09/04/2012  . Chemotherapy-induced peripheral neuropathy ([place]) 05/12/2012  . OAB (overactive bladder) 04/01/2012  . DM w/o complication type II ([place]) 02/06/2012  . Colitis 01/21/2012  . Allergy to dogs 04/19/2011  . Polyarthralgia 09/18/2010  . STRESS INCONTINENCE 04/25/2010  . ASTHMA 04/11/2010  . VERTIGO 04/11/2010  .  Hypothyroidism 01/19/2010  . Hyperlipidemia associated with type 2 diabetes mellitus ([place]) 01/19/2010  . Essential hypertension 01/19/2010  . GERD 01/19/2010  . PEPTIC ULCER DISEASE 01/19/2010  . OSTEOARTHRITIS 01/19/2010  . LOW BACK PAIN 01/19/2010    Allyson Sabal [facility] 04/17/2017, 11:55 AM  [place], [place], [address] Phone: [phone]   Fax:  [phone]  Name: TIJUANA SCHEIDEGGER MRN: [id number] Date of Birth: 01/25/1946  PHYSICAL THERAPY DISCHARGE SUMMARY  Visits from Start of Care: 3  Current functional level related to goals / functional  outcomes: See above   Remaining deficits: See above   Education / Equipment: HEP Plan: Patient agrees to discharge.  Patient goals were met. Patient is being discharged due to meeting the stated rehab goals.  ?????    Allyson Sabal [place], [place] 04/17/17 11:56 AM

## 2017-04-19 ENCOUNTER — Telehealth: Payer: Self-pay | Admitting: General Surgery

## 2017-04-19 ENCOUNTER — Encounter: Payer: Medicare Other | Admitting: Physical Therapy

## 2017-04-19 ENCOUNTER — Other Ambulatory Visit: Payer: Self-pay | Admitting: General Surgery

## 2017-04-19 NOTE — Progress Notes (Signed)
Patient has a contrast allergy.  She will need the 13 hr prednisone prep.  This was called into her pharmacy for her today.  I have also called her to explain when she needs to take these as well as her benadryl.  She understands.  Henreitta Cea 2:29 PM 04/19/2017

## 2017-04-22 ENCOUNTER — Encounter: Payer: Medicare Other | Admitting: Physical Therapy

## 2017-04-22 ENCOUNTER — Ambulatory Visit (HOSPITAL_COMMUNITY)
Admission: RE | Admit: 2017-04-22 | Discharge: 2017-04-22 | Disposition: A | Discharge status: Home / Self Care | Payer: Medicare Other | Source: Ambulatory Visit | Attending: General Surgery | Admitting: General Surgery

## 2017-04-22 DIAGNOSIS — K5730 Diverticulosis of large intestine without perforation or abscess without bleeding: Secondary | ICD-10-CM | POA: Insufficient documentation

## 2017-04-22 DIAGNOSIS — C50111 Malignant neoplasm of central portion of right female breast: Secondary | ICD-10-CM | POA: Insufficient documentation

## 2017-04-22 DIAGNOSIS — R911 Solitary pulmonary nodule: Secondary | ICD-10-CM | POA: Insufficient documentation

## 2017-04-22 DIAGNOSIS — C50911 Malignant neoplasm of unspecified site of right female breast: Secondary | ICD-10-CM | POA: Diagnosis not present

## 2017-04-22 DIAGNOSIS — C50912 Malignant neoplasm of unspecified site of left female breast: Secondary | ICD-10-CM | POA: Diagnosis not present

## 2017-04-22 DIAGNOSIS — N281 Cyst of kidney, acquired: Secondary | ICD-10-CM | POA: Insufficient documentation

## 2017-04-22 DIAGNOSIS — C50112 Malignant neoplasm of central portion of left female breast: Secondary | ICD-10-CM

## 2017-04-22 MED ORDER — IOPAMIDOL (ISOVUE-300) INJECTION 61%
INTRAVENOUS | Status: AC
  Filled 2017-04-22: qty 75

## 2017-04-22 MED ORDER — IOPAMIDOL (ISOVUE-300) INJECTION 61%
100.0000 mL | Freq: Once | INTRAVENOUS | Status: AC | PRN
  Administered 2017-04-22: 73 mL via INTRAVENOUS

## 2017-04-23 ENCOUNTER — Other Ambulatory Visit: Payer: Self-pay | Admitting: Pharmacist

## 2017-04-23 ENCOUNTER — Encounter: Payer: Self-pay | Admitting: Pharmacist

## 2017-04-23 DIAGNOSIS — Z961 Presence of intraocular lens: Secondary | ICD-10-CM | POA: Diagnosis not present

## 2017-04-23 NOTE — Patient Outreach (Addendum)
[place] [facility]) Care Management  04/23/2017  Tracey Evans November 11, 1945 [id number]  Called patient to follow up with her. HIPAA identifiers were obtained. Patient confirmed she just had cataract surgery and had a post-op visit today.  She said she has upcoming orthoscopic knee surgery either 05/03/17 or 05/16/17.  Patient reported the stiches in her eye were very painful but some of the stiches were removed today.  During our last conversation, a request was made to the patient's PCP to send new prescriptions to the patient's pharmacy for 90 day supplies.  Patient confirmed this was done. All of her prescriptions are now for 90 day supplies but they need to be synched.  Patient said the pharmacist suggested she come in and discuss how best to handle synchronization and she said she would have recovering from surgery.  The Citalopram dose was also an issue during our last conversation. The dose was clarified and sent in correctly.    Plan: Patient's pharmacy case will be closed. Patient has my contact information and understands she can call me at anytime with medication related questions or concerns.  Pala is still active with the patient. A note will be sent to the Health Coach.  Elayne Guerin, PharmD, [place] Clinical Pharmacist [phone]

## 2017-04-24 ENCOUNTER — Encounter: Payer: Medicare Other | Admitting: Physical Therapy

## 2017-04-24 ENCOUNTER — Other Ambulatory Visit: Payer: Self-pay

## 2017-04-24 NOTE — Patient Outreach (Signed)
[facility]) Care Management  [place]  04/24/2017   Tracey Evans 1945/12/03 [id number]  Subjective: Telephone call placed to the patient for monthly assessment.  HIPAA verified. The patient states that she is doing ok.  She denies any falls but she is having chronic pain that she rates at a 7/10.  She has medication to take for the pain. She is adherent with her medications. The patient states that she had gotten of her sleep schedule with her crocheting.  She became aware of the problem and has since resolved it.  The patient has been eating more and she thinks it is due to the prednisone that she has taken.  She states that she has been aware of her food choices and trying to eat healthy [place] congratulated her on being conscious of her food choices. The patient had cataracts removed last Monday 04/15/2017.  She will have surgery on her knees at the end of this month or beginning of February.  She has an appointment with her primary in February.  Objective:   Encounter Medications:  Outpatient Encounter Medications as of 04/24/2017  Medication Sig Note  . ALPRAZolam (XANAX) 1 MG tablet TAKE 1 TABLET BY MOUTH TWICE A DAY AS NEEDED   . amLODipine (NORVASC) 2.5 MG tablet Take 1 tablet (2.5 mg total) by mouth daily.   Marland Kitchen aspirin 81 MG tablet Take 81 mg by mouth daily.   Marland Kitchen atorvastatin (LIPITOR) 20 MG tablet TAKE 1 TABLET BY MOUTH EVERY DAY IN THE EVENING   . buPROPion (WELLBUTRIN XL) 300 MG 24 hr tablet Take 1 tablet (300 mg total) by mouth daily.   . citalopram (CELEXA) 10 MG tablet Take 1 tablet (10 mg total) by mouth daily. 03/25/2017: The patient states that she has been increased to 20 mg.  . diphenhydrAMINE (BENADRYL) 50 MG tablet Take one tablet (73ms total) one hour prior to study. 12/31/2016: PRN Only before studies   . letrozole (FEMARA) 2.5 MG tablet Take 1 tablet (2.5 mg total) by mouth daily.   .Marland Kitchenlevothyroxine (SYNTHROID, LEVOTHROID) 100 MCG  tablet Take 1 tablet (100 mcg total) by mouth daily.   . nitroGLYCERIN (NITROSTAT) 0.4 MG SL tablet Place 1 tablet (0.4 mg total) under the tongue every 5 (five) minutes as needed. As needed for chest pain   . PROAIR HFA 108 (90 Base) MCG/ACT inhaler INHALE 2 PUFFS INTO THE LUNGS EVERY 6 (SIX) HOURS AS NEEDED FOR WHEEZING.   . ranitidine (ZANTAC) 150 MG tablet Take 150 mg by mouth 2 (two) times daily as needed for heartburn. Reported on 10/05/2015   . tiZANidine (ZANAFLEX) 4 MG tablet TAKE 1 TABLET BY MOUTH EVERY 8 HOURS AS NEEDED FOR SPASM   . ILEVRO 0.3 % ophthalmic suspension Place 1 drop into both eyes every morning. 04/24/2017: Only 1 gtt in the left eye  . meloxicam (MOBIC) 15 MG tablet Take 15 mg by mouth daily.    .Marland Kitchenofloxacin (OCUFLOX) 0.3 % ophthalmic solution Place 1 drop into both eyes 4 (four) times daily. 04/24/2017: Only in the left bid   . Polyethyl Glycol-Propyl Glycol (SYSTANE ULTRA) 0.4-0.3 % SOLN Place 1 drop into both eyes 3 (three) times daily as needed (for dry eyes).  04/24/2017: Only use it in the right eye    Facility-Administered Encounter Medications as of 04/24/2017  Medication  . sodium chloride flush (NS) 0.9 % injection 10 mL    Functional Status:  In your present state  of health, do you have any difficulty performing the following activities: 03/22/2017 02/22/2017  Hearing? N N  Vision? N N  Difficulty concentrating or making decisions? N N  Walking or climbing stairs? N N  Dressing or bathing? N N  Doing errands, shopping? N N  Preparing Food and eating ? - N  Using the Toilet? - N  In the past six months, have you accidently leaked urine? - N  Do you have problems with loss of bowel control? - N  Managing your Medications? - N  Managing your Finances? - N  Housekeeping or managing your Housekeeping? - N  Some recent data might be hidden    Fall/Depression Screening: Fall Risk  04/24/2017 03/25/2017 03/22/2017  Falls in the past year? No No No  Number  falls in past yr: - - -  Injury with Fall? - - -  Risk for fall due to : - - -  Risk for fall due to: Comment - - -  Follow up - - -   PHQ 2/9 Scores 03/22/2017 02/22/2017 02/21/2017 02/20/2017 01/21/2017 01/04/2017 12/31/2016  PHQ - 2 Score 0 '4 4 1 1 3 6  ' PHQ- 9 Score 0 10 10 - - 11 16  Exception Documentation - - - - - - -    Assessment:  Patient continues to benefit from health coach outreach for disease management and support.   THN CM Care Plan Problem One     Most Recent Value  THN CM Short Term Goal #1   Patient will report decrease in consuming sweets in next 30 days  THN CM Short Term Goal #1 Start Date  12/31/16  [facility] CM Short Term Goal #1 Met Date  04/24/17  Interventions for Short Term Goal #1  RN talked with the patient about diet with the patient abouit food choices  THN CM Short Term Goal #2   In the Next 30 days the patient will be able to verbalize 1 way of dealing with stress  THN CM Short Term Goal #2 Start Date  02/20/17  Interventions for Short Term Goal #2  [place] discussed with the patient copying skills  She seems to be doing better but I would like to continue to monitor her.    THN CM Short Term Goal #3  In the next 30 days the patient will write down questions for her next doctor, s appointment.  THN CM Short Term Goal #3 Start Date  02/20/17  Interventions for Short Tern Goal #3  [place] discussed with the patient about verbalization at her next appointment     Plan: [place] will contact patient in the month of February and patient agrees to next outreach.  Lazaro Arms RN, BSN, [place] Direct Dial:  [phone] Fax: [phone]

## 2017-04-28 ENCOUNTER — Emergency Department (HOSPITAL_BASED_OUTPATIENT_CLINIC_OR_DEPARTMENT_OTHER): Payer: Medicare Other

## 2017-04-28 ENCOUNTER — Emergency Department (HOSPITAL_BASED_OUTPATIENT_CLINIC_OR_DEPARTMENT_OTHER)
Admission: EM | Admit: 2017-04-28 | Discharge: 2017-04-28 | Disposition: A | Discharge status: Home / Self Care | Payer: Medicare Other | Attending: Emergency Medicine | Admitting: Emergency Medicine

## 2017-04-28 ENCOUNTER — Encounter (HOSPITAL_BASED_OUTPATIENT_CLINIC_OR_DEPARTMENT_OTHER): Payer: Self-pay | Admitting: Emergency Medicine

## 2017-04-28 ENCOUNTER — Other Ambulatory Visit: Payer: Self-pay

## 2017-04-28 DIAGNOSIS — S199XXA Unspecified injury of neck, initial encounter: Secondary | ICD-10-CM | POA: Diagnosis present

## 2017-04-28 DIAGNOSIS — I10 Essential (primary) hypertension: Secondary | ICD-10-CM | POA: Diagnosis not present

## 2017-04-28 DIAGNOSIS — Z79899 Other long term (current) drug therapy: Secondary | ICD-10-CM | POA: Insufficient documentation

## 2017-04-28 DIAGNOSIS — E119 Type 2 diabetes mellitus without complications: Secondary | ICD-10-CM | POA: Diagnosis not present

## 2017-04-28 DIAGNOSIS — Y929 Unspecified place or not applicable: Secondary | ICD-10-CM | POA: Insufficient documentation

## 2017-04-28 DIAGNOSIS — R51 Headache: Secondary | ICD-10-CM | POA: Diagnosis not present

## 2017-04-28 DIAGNOSIS — Z853 Personal history of malignant neoplasm of breast: Secondary | ICD-10-CM | POA: Diagnosis not present

## 2017-04-28 DIAGNOSIS — J45909 Unspecified asthma, uncomplicated: Secondary | ICD-10-CM | POA: Insufficient documentation

## 2017-04-28 DIAGNOSIS — Y999 Unspecified external cause status: Secondary | ICD-10-CM | POA: Insufficient documentation

## 2017-04-28 DIAGNOSIS — Z7982 Long term (current) use of aspirin: Secondary | ICD-10-CM | POA: Insufficient documentation

## 2017-04-28 DIAGNOSIS — X509XXA Other and unspecified overexertion or strenuous movements or postures, initial encounter: Secondary | ICD-10-CM | POA: Diagnosis not present

## 2017-04-28 DIAGNOSIS — Y9389 Activity, other specified: Secondary | ICD-10-CM | POA: Diagnosis not present

## 2017-04-28 DIAGNOSIS — M542 Cervicalgia: Secondary | ICD-10-CM | POA: Diagnosis not present

## 2017-04-28 DIAGNOSIS — S161XXA Strain of muscle, fascia and tendon at neck level, initial encounter: Secondary | ICD-10-CM | POA: Insufficient documentation

## 2017-04-28 MED ORDER — DIAZEPAM 5 MG PO TABS: 5.0000 mg | ORAL_TABLET | Freq: Once | ORAL | Status: DC

## 2017-04-28 MED ORDER — TRAMADOL HCL 50 MG PO TABS: 50.0000 mg | ORAL_TABLET | Freq: Four times a day (QID) | ORAL | 0 refills | Status: DC | PRN

## 2017-04-28 MED ORDER — KETOROLAC TROMETHAMINE 30 MG/ML IJ SOLN: 30.0000 mg | Freq: Once | INTRAMUSCULAR | Status: DC

## 2017-04-28 MED ORDER — FENTANYL CITRATE (PF) 100 MCG/2ML IJ SOLN
50.0000 ug | Freq: Once | INTRAMUSCULAR | Status: DC
Start: 2017-04-28 — End: 2017-04-29

## 2017-04-28 MED ORDER — KETOROLAC TROMETHAMINE 30 MG/ML IJ SOLN
30.0000 mg | Freq: Once | INTRAMUSCULAR | Status: AC
  Administered 2017-04-28: 30 mg via INTRAMUSCULAR
  Filled 2017-04-28: qty 1

## 2017-04-28 NOTE — ED Triage Notes (Signed)
Neck pain since waking up yesterday. Pain increases with movement.

## 2017-04-28 NOTE — ED Provider Notes (Signed)
[place] EMERGENCY DEPARTMENT Provider Note   CSN: [id number] Arrival date & time: 04/28/17  1626     History   Chief Complaint Chief Complaint  Patient presents with  . Neck Pain    HPI Tracey Evans is a 72 y.o. female.  Patient is a 72 year old female with a history of breast cancer who presents with neck pain.  She states that she recently had cataract surgery in her left eye and has been subsequently sleeping on her right side.  She woke up yesterday morning with pain to the right side of her neck.  Throughout the day yesterday and this morning the pain has increased to both sides of her neck.  When she woke up this morning she was not able to turn her head from side to side.  She has significant pain and muscle spasm whenever she tries to turn her head.  The pain radiates up through her head.  She denies any nausea or vomiting.  No fevers.  No radiation down her arms.  No numbness or weakness to her extremities.  No recent head trauma.  She is concerned with her history of breast cancer that she might have metastases or other brain abnormalities.  A muscle relaxer yesterday with no improvement in symptoms.  She did not take any medications today for the symptoms.  She has no pain radiating down her spine.  No vision changes.      Past Medical History:  Diagnosis Date  . Angina at rest [facility])    "occurs whenever it wants to, but worse during agitation"  . Anxiety   . Asthma   . Binge eating disorder   . Breast cancer of lower-outer quadrant of left female breast ([place]) 07/08/2015   "NEVER HAD LEFT BREAST CANCER" (08/15/2015)  . Cancer of right breast ([place]) 08/15/2015  . Chronic bronchitis ([place])   . Chronic lower back pain   . Complication of anesthesia    had bronc spasms during intubation surgery 2009 on foot-need albuterol inhaler or neb tx preop  . Costochondritis   . Depression   . Diverticular disease   . Fibromyalgia   . GERD (gastroesophageal reflux  disease)   . Hot flashes   . Hyperlipidemia   . Hypertension   . Hypothyroidism   . Osteoarthritis    "all over"  . Peptic ulcer disease   . Personal history of chemotherapy   . Shortness of breath dyspnea   . Type II diabetes mellitus ([place])    "diet controlled" (08/15/2015)    Patient Active Problem List   Diagnosis Date Noted  . Shingles 02/14/2016  . Anemia associated with nutritional deficiency 01/13/2016  . Infection of nail bed of finger 12/22/2015  . Syncope 10/05/2015  . Genetic testing 08/21/2015  . Family history of breast cancer in female 07/15/2015  . Family history of colon cancer 07/15/2015  . Breast cancer of lower-outer quadrant of right female breast ([place]) 07/08/2015  . Disturbance in sleep behavior 11/18/2014  . Fibromyalgia 10/04/2014  . Flu-like symptoms 07/09/2014  . Diverticulitis of colon (without mention of hemorrhage)(562.11) 12/01/2013  . UTI (urinary tract infection) 12/01/2013  . Abdominal pain, unspecified site 12/01/2013  . Dry mouth 04/29/2013  . Dry eye 04/29/2013  . Dry skin 04/29/2013  . Obesity (BMI 30-39.9) 03/11/2013  . Grief 10/17/2012  . Edema 09/04/2012  . Depression 09/04/2012  . Chemotherapy-induced peripheral neuropathy ([place]) 05/12/2012  . OAB (overactive bladder) 04/01/2012  . DM w/o complication  type II ([place]) 02/06/2012  . Colitis 01/21/2012  . Allergy to dogs 04/19/2011  . Polyarthralgia 09/18/2010  . STRESS INCONTINENCE 04/25/2010  . ASTHMA 04/11/2010  . VERTIGO 04/11/2010  . Hypothyroidism 01/19/2010  . Hyperlipidemia associated with type 2 diabetes mellitus ([place]) 01/19/2010  . Essential hypertension 01/19/2010  . GERD 01/19/2010  . PEPTIC ULCER DISEASE 01/19/2010  . OSTEOARTHRITIS 01/19/2010  . LOW BACK PAIN 01/19/2010    Past Surgical History:  Procedure Laterality Date  . ABDOMINAL HYSTERECTOMY     "left my ovaries"  . ANKLE ARTHROSCOPY  01/02/2012   Procedure: ANKLE ARTHROSCOPY;  Surgeon: Colin Rhein, MD;   Location: [place];  Service: Orthopedics;  Laterality: Right;  right ankle arthroscopy with extensive debridement , dridement and drilling talar dome osteochondral lesion  . ANKLE FRACTURE SURGERY Right 2009   Dr. Rolena Infante  . BREAST BIOPSY  07/2015  . CARDIAC CATHETERIZATION    . CARPAL TUNNEL RELEASE Bilateral   . COLONOSCOPY    . DILATION AND CURETTAGE OF UTERUS     "before hysterectomy"  . FRACTURE SURGERY    . KNEE ARTHROSCOPY Bilateral   . LAPAROSCOPIC CHOLECYSTECTOMY    . MASTECTOMY COMPLETE / SIMPLE W/ SENTINEL NODE BIOPSY Right 08/15/2015  . MASTECTOMY W/ SENTINEL NODE BIOPSY Right 08/15/2015   Procedure: TOTAL MASTECTOMY WITH SENTINEL LYMPH NODE BIOPSY AND BLUE DYE INJECTION ;  Surgeon: Fanny Skates, MD;  Location: [place];  Service: General;  Laterality: Right;  . PORT-A-CATH REMOVAL N/A 11/12/2016   Procedure: REMOVAL PORT-A-CATH;  Surgeon: Jackolyn Confer, MD;  Location: [facility];  Service: General;  Laterality: N/A;  . PORTACATH PLACEMENT Left 08/15/2015  . PORTACATH PLACEMENT Left 08/15/2015   Procedure: INSERTION PORT-A-CATH ;  Surgeon: Fanny Skates, MD;  Location: [place];  Service: General;  Laterality: Left;  . SHOULDER ARTHROSCOPY Bilateral 2009-2011   "bone spurs"  . TONSILLECTOMY      OB History    No data available       Home Medications    Prior to Admission medications   Medication Sig Start Date End Date Taking? Authorizing Provider  ALPRAZolam Duanne Moron) 1 MG tablet TAKE 1 TABLET BY MOUTH TWICE A DAY AS NEEDED 04/10/17   Midge Minium, MD  amLODipine (NORVASC) 2.5 MG tablet Take 1 tablet (2.5 mg total) by mouth daily. 03/14/17   Midge Minium, MD  aspirin 81 MG tablet Take 81 mg by mouth daily.    [provider]  atorvastatin (LIPITOR) 20 MG tablet TAKE 1 TABLET BY MOUTH EVERY DAY IN THE EVENING 03/14/17   Midge Minium, MD  buPROPion (WELLBUTRIN XL) 300 MG 24 hr tablet Take 1 tablet (300 mg total) by mouth  daily. 03/14/17   Midge Minium, MD  citalopram (CELEXA) 10 MG tablet Take 1 tablet (10 mg total) by mouth daily. 03/14/17   Midge Minium, MD  diphenhydrAMINE (BENADRYL) 50 MG tablet Take one tablet (50mg s total) one hour prior to study. 07/27/16   Nicholas Lose, MD  ILEVRO 0.3 % ophthalmic suspension Place 1 drop into both eyes every morning. 04/05/17   Marylynn Pearson, MD  letrozole [facility]) 2.5 MG tablet Take 1 tablet (2.5 mg total) by mouth daily. 07/31/16   Nicholas Lose, MD  levothyroxine (SYNTHROID, LEVOTHROID) 100 MCG tablet Take 1 tablet (100 mcg total) by mouth daily. 03/25/17   Midge Minium, MD  meloxicam (MOBIC) 15 MG tablet Take 15 mg by mouth daily.  06/04/15  [provider]  nitroGLYCERIN (NITROSTAT) 0.4 MG SL tablet Place 1 tablet (0.4 mg total) under the tongue every 5 (five) minutes as needed. As needed for chest pain 11/18/14   Midge Minium, MD  ofloxacin (OCUFLOX) 0.3 % ophthalmic solution Place 1 drop into both eyes 4 (four) times daily. 04/05/17   Marylynn Pearson, MD  Polyethyl Glycol-Propyl Glycol (SYSTANE ULTRA) 0.4-0.3 % SOLN Place 1 drop into both eyes 3 (three) times daily as needed (for dry eyes).     [provider]  PROAIR HFA 108 (90 Base) MCG/ACT inhaler INHALE 2 PUFFS INTO THE LUNGS EVERY 6 (SIX) HOURS AS NEEDED FOR WHEEZING. 05/31/16   Midge Minium, MD  ranitidine (ZANTAC) 150 MG tablet Take 150 mg by mouth 2 (two) times daily as needed for heartburn. Reported on 10/05/2015    [provider]  tiZANidine (ZANAFLEX) 4 MG tablet TAKE 1 TABLET BY MOUTH EVERY 8 HOURS AS NEEDED FOR SPASM 04/05/16   [provider]  traMADol (ULTRAM) 50 MG tablet Take 1 tablet (50 mg total) by mouth every 6 (six) hours as needed. 04/28/17   Malvin Johns, MD    Family History Family History  Problem Relation Age of Onset  . Dementia Mother   . Stroke Mother   . Heart Problems Mother   . Dementia Father   . Alcohol abuse  Father   . Colon cancer Father        dx. 67 or younger  . Breast cancer Sister 29       inflammatory  . Diverticulitis Sister        maternal half-sister; severe - causing partial colectomy  . Breast cancer Sister        maternal half-sister; dx. early 61s  . Breast cancer Other 32       niece  . Breast cancer Other        niece dx. 27s  . Alcohol abuse Brother     Social History Social History   Tobacco Use  . Smoking status: Never Smoker  . Smokeless tobacco: Never Used  Substance Use Topics  . Alcohol use: Yes    Alcohol/week: 0.0 oz    Comment: 08/15/2015 "1/2 glass of wine q 2 months"   . Drug use: No     Allergies   Contrast media [iodinated diagnostic agents]; Dilaudid [hydromorphone hcl]; Adhesive [tape]; Codeine; and Lactose intolerance (gi)   Review of Systems Review of Systems  Constitutional: Negative for chills, diaphoresis, fatigue and fever.  HENT: Negative for congestion, rhinorrhea and sneezing.   Eyes: Negative.   Respiratory: Negative for cough, chest tightness and shortness of breath.   Cardiovascular: Negative for chest pain and leg swelling.  Gastrointestinal: Negative for abdominal pain, blood in stool, diarrhea, nausea and vomiting.  Genitourinary: Negative for difficulty urinating, flank pain, frequency and hematuria.  Musculoskeletal: Positive for neck pain and neck stiffness. Negative for arthralgias and back pain.  Skin: Negative for rash.  Neurological: Positive for headaches. Negative for dizziness, speech difficulty, weakness and numbness.     Physical Exam Updated Vital Signs BP (!) 143/89 (BP Location: Left Arm)   Pulse 95   Temp 99.4 F (37.4 C) (Oral)   Resp 18   Ht 5\' 5"  (1.651 m)   Wt 89.8 kg (198 lb)   SpO2 95%   BMI 32.95 kg/m   Physical Exam  Constitutional: She is oriented to person, place, and time. She appears well-developed and well-nourished.  HENT:  Head: Normocephalic and atraumatic.  Eyes: Pupils are  equal, round, and reactive to light.  Neck:  Patient has tenderness along both paraspinal areas of her neck.  There is muscle spasm throughout and she has limited range of motion of her.  There is no pain in the thoracic or lumbosacral spine.  There is pain along the musculature of the face and scalp.  No localized tenderness over the temporal arteries.  Cardiovascular: Normal rate, regular rhythm and normal heart sounds.  Pulmonary/Chest: Effort normal and breath sounds normal. No respiratory distress. She has no wheezes. She has no rales. She exhibits no tenderness.  Abdominal: Soft. Bowel sounds are normal. There is no tenderness. There is no rebound and no guarding.  Musculoskeletal: Normal range of motion. She exhibits no edema.  Lymphadenopathy:    She has no cervical adenopathy.  Neurological: She is alert and oriented to person, place, and time.  Motor 5 out of 5 all extremities, sensation grossly intact light touch all extremities  Skin: Skin is warm and dry. No rash noted.  Psychiatric: She has a normal mood and affect.     ED Treatments / Results  Labs (all labs ordered are listed, but only abnormal results are displayed) Labs Reviewed - No data to display  EKG  EKG Interpretation None       Radiology Ct Head Wo Contrast  Result Date: 04/28/2017 CLINICAL DATA:  72 year old female with a history of breast cancer. One day history of neck pain. EXAM: CT HEAD WITHOUT CONTRAST CT CERVICAL SPINE WITHOUT CONTRAST TECHNIQUE: Multidetector CT imaging of the head and cervical spine was performed following the standard protocol without intravenous contrast. Multiplanar CT image reconstructions of the cervical spine were also generated. COMPARISON:  Prior CT scan of the head 12/16/2007 FINDINGS: CT HEAD FINDINGS Brain: No evidence of acute infarction, hemorrhage, hydrocephalus, extra-axial collection or mass lesion/mass effect. Moderate cerebral cortical atrophy. Vascular: No hyperdense  vessel or unexpected calcification. Skull: Normal. Negative for fracture or focal lesion. Sinuses/Orbits: No acute finding. Other: None. CT CERVICAL SPINE FINDINGS Alignment: Normal. Skull base and vertebrae: No acute fracture. No primary bone lesion or focal pathologic process. Soft tissues and spinal canal: No prevertebral fluid or swelling. No visible canal hematoma. Disc levels: Multilevel degenerative disc disease most significant at C6-C7 where a partially calcified posterior disc osteophyte complex results in central stenosis. Additionally, there is moderate left-sided facet arthropathy at C3-C4 and C4-C5. Upper chest: Negative. Other: None IMPRESSION: CT HEAD 1. No acute intracranial abnormality. 2. Moderate cerebral cortical atrophy with expected mild progression compared to prior imaging from 2009. CT CSPINE 1. No acute fracture, malalignment or osseous lesion. 2. 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 3. Left-sided facet arthropathy at C3-C4 and C4-C5. Electronically Signed   By: Jacqulynn Cadet M.D.   On: 04/28/2017 19:35   Ct Cervical Spine Wo Contrast  Result Date: 04/28/2017 CLINICAL DATA:  72 year old female with a history of breast cancer. One day history of neck pain. EXAM: CT HEAD WITHOUT CONTRAST CT CERVICAL SPINE WITHOUT CONTRAST TECHNIQUE: Multidetector CT imaging of the head and cervical spine was performed following the standard protocol without intravenous contrast. Multiplanar CT image reconstructions of the cervical spine were also generated. COMPARISON:  Prior CT scan of the head 12/16/2007 FINDINGS: CT HEAD FINDINGS Brain: No evidence of acute infarction, hemorrhage, hydrocephalus, extra-axial collection or mass lesion/mass effect. Moderate cerebral cortical atrophy. Vascular: No hyperdense vessel or unexpected calcification. Skull: Normal. Negative for fracture or focal  lesion. Sinuses/Orbits: No acute finding. Other: None. CT CERVICAL SPINE FINDINGS Alignment:  Normal. Skull base and vertebrae: No acute fracture. No primary bone lesion or focal pathologic process. Soft tissues and spinal canal: No prevertebral fluid or swelling. No visible canal hematoma. Disc levels: Multilevel degenerative disc disease most significant at C6-C7 where a partially calcified posterior disc osteophyte complex results in central stenosis. Additionally, there is moderate left-sided facet arthropathy at C3-C4 and C4-C5. Upper chest: Negative. Other: None IMPRESSION: CT HEAD 1. No acute intracranial abnormality. 2. Moderate cerebral cortical atrophy with expected mild progression compared to prior imaging from 2009. CT CSPINE 1. No acute fracture, malalignment or osseous lesion. 2. 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 3. Left-sided facet arthropathy at C3-C4 and C4-C5. Electronically Signed   By: Jacqulynn Cadet M.D.   On: 04/28/2017 19:35    Procedures Procedures (including critical care time)  Medications Ordered in ED Medications  fentaNYL (SUBLIMAZE) injection 50 mcg (50 mcg Intravenous Not Given 04/28/17 1943)  diazepam (VALIUM) tablet 5 mg (5 mg Oral Not Given 04/28/17 1943)  ketorolac (TORADOL) 30 MG/ML injection 30 mg (30 mg Intramuscular Given 04/28/17 2039)     Initial Impression / Assessment and Plan / ED Course  I have reviewed the triage vital signs and the nursing notes.  Pertinent labs & imaging results that were available during my care of the patient were reviewed by me and considered in my medical decision making (see chart for details).     Patient presents with neck pain.  She has stiffness to her paraspinal muscles.  There is no other indications of other etiology such as meningitis.  She has no radicular symptoms or neurologic deficits.  Given her history of breast cancer, I did do CT scans of her head and cervical spine which were negative for acute abnormalities.  She did not have anyone to drive her home so was not given muscle  relaxers or narcotic pain medicines in the emergency room although she was given Toradol.  She actually had a significant amount improvement with the Toradol was able to sit up and move her neck around more than she had in the past.  Her headache was significantly improved.  She was discharged home in good condition.  She was given a prescription for tramadol to use in addition to her Zanaflex that she has at home.  She was encouraged to have close follow-up with her PCP.  Return precautions were given.  Final Clinical Impressions(s) / ED Diagnoses   Final diagnoses:  Acute strain of neck muscle, initial encounter    ED Discharge Orders        Ordered    traMADol (ULTRAM) 50 MG tablet  Every 6 hours PRN     04/28/17 2115       Malvin Johns, MD 04/28/17 2118

## 2017-04-29 ENCOUNTER — Encounter: Payer: Medicare Other | Admitting: Physical Therapy

## 2017-04-30 DIAGNOSIS — Z961 Presence of intraocular lens: Secondary | ICD-10-CM | POA: Diagnosis not present

## 2017-04-30 DIAGNOSIS — H5212 Myopia, left eye: Secondary | ICD-10-CM | POA: Diagnosis not present

## 2017-04-30 DIAGNOSIS — H52223 Regular astigmatism, bilateral: Secondary | ICD-10-CM | POA: Diagnosis not present

## 2017-05-01 ENCOUNTER — Encounter: Payer: Medicare Other | Admitting: Physical Therapy

## 2017-05-02 ENCOUNTER — Ambulatory Visit (INDEPENDENT_AMBULATORY_CARE_PROVIDER_SITE_OTHER): Payer: Medicare Other | Admitting: Family Medicine

## 2017-05-02 ENCOUNTER — Other Ambulatory Visit: Payer: Self-pay

## 2017-05-02 ENCOUNTER — Other Ambulatory Visit: Payer: Self-pay | Admitting: General Practice

## 2017-05-02 ENCOUNTER — Encounter: Payer: Self-pay | Admitting: Family Medicine

## 2017-05-02 VITALS — BP 118/68 | HR 76 | Temp 98.9°F | Resp 16 | Ht 65.0 in | Wt 199.1 lb

## 2017-05-02 DIAGNOSIS — M5090 Cervical disc disorder, unspecified, unspecified cervical region: Secondary | ICD-10-CM | POA: Diagnosis not present

## 2017-05-02 DIAGNOSIS — R05 Cough: Secondary | ICD-10-CM

## 2017-05-02 DIAGNOSIS — R059 Cough, unspecified: Secondary | ICD-10-CM

## 2017-05-02 MED ORDER — MELOXICAM 15 MG PO TABS: 15.0000 mg | ORAL_TABLET | Freq: Every day | ORAL | 1 refills | Status: DC

## 2017-05-02 MED ORDER — PROMETHAZINE-DM 6.25-15 MG/5ML PO SYRP: 5.0000 mL | ORAL_SOLUTION | Freq: Four times a day (QID) | ORAL | 0 refills | Status: DC | PRN

## 2017-05-02 NOTE — Progress Notes (Signed)
   Subjective:    Patient ID: Tracey Evans, female    DOB: 02-Oct-1945, 72 y.o.   MRN: [id number]  HPI Neck pain- pt was seen in [facility] on 1/20 and had CT of neck that showed DDD w/ central stenosis at C6-7 and L sided facet arthropathy at C3-4 and C4-5.  Pt reports neck stiffness.  No relief w/ heat.  Tramadol causes her to do 'weird things'.  Using Tizanidine w/ some relief.  Not interested in seeing a specialist at this time.    Cough- pt woke w/ cough on Monday.  No fevers.  Cough is intermittently productive of 'pale green' sputum.  No SOB.  + nasal congestion.  No sinus pain/pressure.  No ear pain.  No N/V.  + HA.  No facial pain or tooth pain.  + sick contacts.   Review of Systems For ROS see HPI     Objective:   Physical Exam  Constitutional: She is oriented to person, place, and time. She appears well-developed and well-nourished. No distress.  HENT:  Head: Normocephalic and atraumatic.  TMs normal bilaterally Mild nasal congestion Throat w/out erythema, edema, or exudate  Eyes: Conjunctivae and EOM are normal. Pupils are equal, round, and reactive to light.  Neck: Normal range of motion. Neck supple.  Cardiovascular: Normal rate, regular rhythm, normal heart sounds and intact distal pulses.  No murmur heard. Pulmonary/Chest: Effort normal and breath sounds normal. No respiratory distress. She has no wheezes.  + hacking cough  Musculoskeletal:  Pain w/ rotation of neck- particularly to R side Pain w/ extension>flexion  Lymphadenopathy:    She has no cervical adenopathy.  Neurological: She is alert and oriented to person, place, and time.  Skin: Skin is warm and dry.  Vitals reviewed.         Assessment & Plan:  Cough- appears viral in nature.  No evidence of bacterial infxn on exam.  No need for abx.  Cough med refilled.  Reviewed supportive care and red flags that should prompt return.  Pt expressed understanding and is in agreement w/ plan.   Cervical disc  disease- new.  Reviewed CT w/ pt.  Offered specialty referral but she is not interested at this time.  Continue Meloxicam and muscle relaxer.  Reviewed supportive care and red flags that should prompt return.  Pt expressed understanding and is in agreement w/ plan.

## 2017-05-02 NOTE — Patient Instructions (Addendum)
Follow up as needed or as scheduled Start the Promethazine cough syrup up to 4x/day to help w/ cough Drink plenty of fluids REST!! Continue the Tizanidine (Zanaflex) for muscle spasm and Meloxicam for inflammation Call with any questions or concerns Hang in there!

## 2017-05-03 ENCOUNTER — Encounter: Payer: Self-pay | Admitting: Family Medicine

## 2017-05-06 ENCOUNTER — Encounter: Payer: Medicare Other | Admitting: Physical Therapy

## 2017-05-08 ENCOUNTER — Encounter: Payer: Medicare Other | Admitting: Physical Therapy

## 2017-05-08 DIAGNOSIS — C50111 Malignant neoplasm of central portion of right female breast: Secondary | ICD-10-CM | POA: Diagnosis not present

## 2017-05-14 DIAGNOSIS — H5212 Myopia, left eye: Secondary | ICD-10-CM | POA: Diagnosis not present

## 2017-05-14 DIAGNOSIS — H52223 Regular astigmatism, bilateral: Secondary | ICD-10-CM | POA: Diagnosis not present

## 2017-05-14 DIAGNOSIS — Z961 Presence of intraocular lens: Secondary | ICD-10-CM | POA: Diagnosis not present

## 2017-05-14 DIAGNOSIS — H5201 Hypermetropia, right eye: Secondary | ICD-10-CM | POA: Diagnosis not present

## 2017-05-16 DIAGNOSIS — M23222 Derangement of posterior horn of medial meniscus due to old tear or injury, left knee: Secondary | ICD-10-CM | POA: Diagnosis not present

## 2017-05-16 DIAGNOSIS — M23342 Other meniscus derangements, anterior horn of lateral meniscus, left knee: Secondary | ICD-10-CM | POA: Diagnosis not present

## 2017-05-16 DIAGNOSIS — M23262 Derangement of other lateral meniscus due to old tear or injury, left knee: Secondary | ICD-10-CM | POA: Diagnosis not present

## 2017-05-16 DIAGNOSIS — M94262 Chondromalacia, left knee: Secondary | ICD-10-CM | POA: Diagnosis not present

## 2017-05-16 DIAGNOSIS — M2242 Chondromalacia patellae, left knee: Secondary | ICD-10-CM | POA: Diagnosis not present

## 2017-05-16 DIAGNOSIS — G8918 Other acute postprocedural pain: Secondary | ICD-10-CM | POA: Diagnosis not present

## 2017-05-16 DIAGNOSIS — M23322 Other meniscus derangements, posterior horn of medial meniscus, left knee: Secondary | ICD-10-CM | POA: Diagnosis not present

## 2017-06-03 DIAGNOSIS — M797 Fibromyalgia: Secondary | ICD-10-CM | POA: Diagnosis not present

## 2017-06-03 DIAGNOSIS — M5136 Other intervertebral disc degeneration, lumbar region: Secondary | ICD-10-CM | POA: Diagnosis not present

## 2017-06-03 DIAGNOSIS — M150 Primary generalized (osteo)arthritis: Secondary | ICD-10-CM | POA: Diagnosis not present

## 2017-06-10 ENCOUNTER — Other Ambulatory Visit: Payer: Self-pay | Admitting: Hematology and Oncology

## 2017-06-10 DIAGNOSIS — Z1231 Encounter for screening mammogram for malignant neoplasm of breast: Secondary | ICD-10-CM

## 2017-06-20 ENCOUNTER — Encounter: Payer: Self-pay | Admitting: *Deleted

## 2017-06-20 DIAGNOSIS — H5212 Myopia, left eye: Secondary | ICD-10-CM | POA: Diagnosis not present

## 2017-06-20 DIAGNOSIS — H52223 Regular astigmatism, bilateral: Secondary | ICD-10-CM | POA: Diagnosis not present

## 2017-06-20 DIAGNOSIS — Z961 Presence of intraocular lens: Secondary | ICD-10-CM | POA: Diagnosis not present

## 2017-06-20 DIAGNOSIS — H25811 Combined forms of age-related cataract, right eye: Secondary | ICD-10-CM | POA: Diagnosis not present

## 2017-06-21 DIAGNOSIS — Z4789 Encounter for other orthopedic aftercare: Secondary | ICD-10-CM | POA: Diagnosis not present

## 2017-06-21 DIAGNOSIS — M25562 Pain in left knee: Secondary | ICD-10-CM | POA: Diagnosis not present

## 2017-06-21 NOTE — Telephone Encounter (Signed)
This encounter was created in error - please disregard.

## 2017-07-05 ENCOUNTER — Other Ambulatory Visit: Payer: Self-pay | Admitting: Family Medicine

## 2017-07-05 DIAGNOSIS — E785 Hyperlipidemia, unspecified: Secondary | ICD-10-CM | POA: Diagnosis not present

## 2017-07-05 DIAGNOSIS — I10 Essential (primary) hypertension: Secondary | ICD-10-CM | POA: Diagnosis not present

## 2017-07-05 DIAGNOSIS — M797 Fibromyalgia: Secondary | ICD-10-CM | POA: Diagnosis not present

## 2017-07-05 DIAGNOSIS — E039 Hypothyroidism, unspecified: Secondary | ICD-10-CM | POA: Diagnosis not present

## 2017-07-05 DIAGNOSIS — I2510 Atherosclerotic heart disease of native coronary artery without angina pectoris: Secondary | ICD-10-CM | POA: Diagnosis not present

## 2017-07-05 DIAGNOSIS — M1990 Unspecified osteoarthritis, unspecified site: Secondary | ICD-10-CM | POA: Diagnosis not present

## 2017-07-08 ENCOUNTER — Other Ambulatory Visit: Payer: Self-pay | Admitting: General Practice

## 2017-07-08 ENCOUNTER — Other Ambulatory Visit: Payer: Self-pay

## 2017-07-08 ENCOUNTER — Encounter: Payer: Self-pay | Admitting: Family Medicine

## 2017-07-08 ENCOUNTER — Other Ambulatory Visit (HOSPITAL_COMMUNITY)
Admission: RE | Admit: 2017-07-08 | Discharge: 2017-07-08 | Disposition: A | Discharge status: Home / Self Care | Payer: Medicare Other | Source: Ambulatory Visit | Attending: Family Medicine | Admitting: Family Medicine

## 2017-07-08 ENCOUNTER — Ambulatory Visit (INDEPENDENT_AMBULATORY_CARE_PROVIDER_SITE_OTHER): Payer: Medicare Other | Admitting: Family Medicine

## 2017-07-08 VITALS — BP 126/68 | HR 71 | Temp 98.1°F | Resp 16 | Ht 65.0 in | Wt 201.1 lb

## 2017-07-08 DIAGNOSIS — N950 Postmenopausal bleeding: Secondary | ICD-10-CM | POA: Insufficient documentation

## 2017-07-08 DIAGNOSIS — E038 Other specified hypothyroidism: Secondary | ICD-10-CM | POA: Diagnosis not present

## 2017-07-08 DIAGNOSIS — E1169 Type 2 diabetes mellitus with other specified complication: Secondary | ICD-10-CM

## 2017-07-08 DIAGNOSIS — E119 Type 2 diabetes mellitus without complications: Secondary | ICD-10-CM | POA: Diagnosis not present

## 2017-07-08 DIAGNOSIS — E785 Hyperlipidemia, unspecified: Secondary | ICD-10-CM

## 2017-07-08 DIAGNOSIS — E039 Hypothyroidism, unspecified: Secondary | ICD-10-CM | POA: Diagnosis not present

## 2017-07-08 LAB — POCT URINALYSIS DIPSTICK
GLUCOSE UA: NEGATIVE
KETONES UA: NEGATIVE
Nitrite, UA: NEGATIVE
PH UA: 5 (ref 5.0–8.0)
Protein, UA: NEGATIVE
RBC UA: NEGATIVE
UROBILINOGEN UA: 0.2 U/dL

## 2017-07-08 LAB — BASIC METABOLIC PANEL
BUN: 15 mg/dL (ref 6–23)
CHLORIDE: 107 meq/L (ref 96–112)
CO2: 29 meq/L (ref 19–32)
Calcium: 9.7 mg/dL (ref 8.4–10.5)
Creatinine, Ser: 0.95 mg/dL (ref 0.40–1.20)
GFR: 74.48 mL/min (ref >60.00)
Glucose, Bld: 113 mg/dL — ABNORMAL HIGH (ref 70–99)
POTASSIUM: 4.5 meq/L (ref 3.5–5.1)
Sodium: 143 mEq/L (ref 135–145)

## 2017-07-08 LAB — HEPATIC FUNCTION PANEL
ALT: 12 U/L (ref 0–35)
AST: 12 U/L (ref 0–37)
Albumin: 3.9 g/dL (ref 3.5–5.2)
Alkaline Phosphatase: 88 U/L (ref 39–117)
BILIRUBIN DIRECT: 0.1 mg/dL (ref 0.0–0.3)
TOTAL PROTEIN: 6.5 g/dL (ref 6.0–8.3)
Total Bilirubin: 0.4 mg/dL (ref 0.2–1.2)

## 2017-07-08 LAB — CBC WITH DIFFERENTIAL/PLATELET
BASOS PCT: 0.6 % (ref 0.0–3.0)
Basophils Absolute: 0 10*3/uL (ref 0.0–0.1)
EOS ABS: 0.2 10*3/uL (ref 0.0–0.7)
EOS PCT: 3.6 % (ref 0.0–5.0)
HCT: 40 % (ref 36.0–46.0)
HEMOGLOBIN: 13.2 g/dL (ref 12.0–15.0)
LYMPHS ABS: 2.4 10*3/uL (ref 0.7–4.0)
Lymphocytes Relative: 34.9 % (ref 12.0–46.0)
MCHC: 33 g/dL (ref 30.0–36.0)
MCV: 93.7 fl (ref 78.0–100.0)
MONO ABS: 0.5 10*3/uL (ref 0.1–1.0)
Monocytes Relative: 7.4 % (ref 3.0–12.0)
NEUTROS ABS: 3.7 10*3/uL (ref 1.4–7.7)
NEUTROS PCT: 53.5 % (ref 43.0–77.0)
PLATELETS: 261 10*3/uL (ref 150.0–400.0)
RBC: 4.27 Mil/uL (ref 3.87–5.11)
RDW: 13.8 % (ref 11.5–15.5)
WBC: 6.9 10*3/uL (ref 4.0–10.5)

## 2017-07-08 LAB — LIPID PANEL
Cholesterol: 115 mg/dL (ref 0–200)
HDL: 43.1 mg/dL (ref >39.00)
LDL Cholesterol: 56 mg/dL (ref 0–99)
NONHDL: 72.23
TRIGLYCERIDES: 79 mg/dL (ref 0.0–149.0)
Total CHOL/HDL Ratio: 3
VLDL: 15.8 mg/dL (ref 0.0–40.0)

## 2017-07-08 LAB — TSH: TSH: 0.12 u[IU]/mL — ABNORMAL LOW (ref 0.35–4.50)

## 2017-07-08 LAB — HEMOGLOBIN A1C: HEMOGLOBIN A1C: 6.5 % (ref 4.6–6.5)

## 2017-07-08 MED ORDER — ALPRAZOLAM 1 MG PO TABS: 1.0000 mg | ORAL_TABLET | Freq: Two times a day (BID) | ORAL | 1 refills | Status: DC | PRN

## 2017-07-08 MED ORDER — LEVOTHYROXINE SODIUM 88 MCG PO TABS: 88.0000 ug | ORAL_TABLET | Freq: Every day | ORAL | 3 refills | Status: DC

## 2017-07-08 NOTE — Assessment & Plan Note (Signed)
Chronic problem.  Tolerating statin w/o difficulty.  Check labs.  Adjust meds prn  

## 2017-07-08 NOTE — Progress Notes (Signed)
   Subjective:    Patient ID: Tracey Evans, female    DOB: 14-Jun-1945, 72 y.o.   MRN: [id number]  HPI DM- chronic problem.  Controlled w/ diet and exercise.  Has never been on medication.  UTD on eye exam- has cataract scheduled for next week, foot exam, microalbumin  Hyperlipidemia- chronic problem, on Lipitor 20mg  daily.  Denies abd pain, N/V.  Hypothyroid- TSH in December was 8.1  She was started on Levothyroxine 150mcg daily.  Denies changes to skin, hair, nails  Vaginal bleeding- s/p hysterectomy, pt will have bleeding when she douches.  She reports that when she inserts the douche, she has a very difficult time removing it- 'I don't know what's catching'.  She also reports a 'dark brown, foul smelling' discharge.  sxs started ~1 month ago.  On Femara for breast cancer.   Review of Systems For ROS see HPI     Objective:   Physical Exam  Constitutional: She is oriented to person, place, and time. She appears well-developed and well-nourished. No distress.  HENT:  Head: Normocephalic and atraumatic.  Eyes: Pupils are equal, round, and reactive to light. Conjunctivae and EOM are normal.  Neck: Normal range of motion. Neck supple. No thyromegaly present.  Cardiovascular: Normal rate, regular rhythm, normal heart sounds and intact distal pulses.  No murmur heard. Pulmonary/Chest: Effort normal and breath sounds normal. No respiratory distress.  Abdominal: Soft. She exhibits no distension. There is no tenderness.  Musculoskeletal: She exhibits no edema.  Lymphadenopathy:    She has no cervical adenopathy.  Neurological: She is alert and oriented to person, place, and time.  Skin: Skin is warm and dry.  Psychiatric: She has a normal mood and affect. Her behavior is normal.  Vitals reviewed.         Assessment & Plan:  Vaginal bleeding- new.  Pt reports bleeding w/ douching but also a foul smelling brown discharge on her liner throughout the week w/o douching.  Will check  yeast and BV but given that she is postmenopausal and on Femara, will refer to GYN.  Pt expressed understanding and is in agreement w/ plan.

## 2017-07-08 NOTE — Assessment & Plan Note (Signed)
Chronic problem.  Diet controlled.  UTD on eye exam, foot exam, microalbumin (all due in May).  Attempted to order microalbumin but pt could not provide enough urine.  Foot exam done today.  Again discussed need for healthy diet and regular exercise.

## 2017-07-08 NOTE — Assessment & Plan Note (Signed)
Pt's TSH was up to 8.1 in December.  Levothyroxine was increased to 170mcg daily.  Check labs.  Adjust meds prn

## 2017-07-08 NOTE — Patient Instructions (Signed)
Follow up in 3-4 months to recheck diabetes We'll notify you of your lab results and make any changes if needed We'll call you with your GYN referral for the vaginal bleeding Continue to work on healthy diet and exercise as able- you can do it!! Have them send me a copy of your eye exams Call with any questions or concerns Happy Spring!!!

## 2017-07-08 NOTE — Telephone Encounter (Signed)
Called pt to give lab results and she asked for a refill on her Alprazolam  Last OV 07/08/17 Alprazolam last filled 04/10/17 #60 with 1

## 2017-07-10 LAB — URINE CYTOLOGY ANCILLARY ONLY
BACTERIAL VAGINITIS: NEGATIVE
CANDIDA VAGINITIS: NEGATIVE

## 2017-07-15 DIAGNOSIS — H25811 Combined forms of age-related cataract, right eye: Secondary | ICD-10-CM | POA: Diagnosis not present

## 2017-07-15 DIAGNOSIS — H2511 Age-related nuclear cataract, right eye: Secondary | ICD-10-CM | POA: Diagnosis not present

## 2017-07-16 DIAGNOSIS — Z961 Presence of intraocular lens: Secondary | ICD-10-CM | POA: Diagnosis not present

## 2017-07-18 DIAGNOSIS — H20012 Primary iridocyclitis, left eye: Secondary | ICD-10-CM | POA: Diagnosis not present

## 2017-07-18 DIAGNOSIS — Z961 Presence of intraocular lens: Secondary | ICD-10-CM | POA: Diagnosis not present

## 2017-07-22 ENCOUNTER — Ambulatory Visit
Admission: RE | Admit: 2017-07-22 | Discharge: 2017-07-22 | Disposition: A | Discharge status: Home / Self Care | Payer: Medicare Other | Source: Ambulatory Visit | Attending: Hematology and Oncology | Admitting: Hematology and Oncology

## 2017-07-22 DIAGNOSIS — Z1231 Encounter for screening mammogram for malignant neoplasm of breast: Secondary | ICD-10-CM | POA: Diagnosis not present

## 2017-07-23 ENCOUNTER — Other Ambulatory Visit: Payer: Self-pay

## 2017-07-23 NOTE — Patient Outreach (Signed)
[facility] ([facility] Care Management  [facility] Care Manager  07/23/2017   Tracey Evans 04/16/1945 [id number]  Subjective: Telephone call to the patient for monthly assessment. HIPAA verified. The patient states that she is doing ok.  She denies any falls.  She does state that she is having pain in her knees.  She has arthroscopic surgery on her left knee.  She rates the pain at a 8/10.  She has pain medications and it helps but it makes her sleepy.  She had an appointment on 07/08/2017 and checked her a1c it was 6.5.  The patient states that she is adherent with her medications and monitoring her diet. RN Health Coach encouraged the patient to continue doing a good job with her a1c.  The patient verbalized understanding.  The patient has an appointment with her orthopedist next week   Encounter Medications:  Outpatient Encounter Medications as of 07/23/2017  Medication Sig Note  . ALPRAZolam (XANAX) 1 MG tablet Take 1 tablet (1 mg total) by mouth 2 (two) times daily as needed.   . amLODipine (NORVASC) 2.5 MG tablet Take 1 tablet (2.5 mg total) by mouth daily.   . aspirin 81 MG tablet Take 81 mg by mouth daily.   . atorvastatin (LIPITOR) 20 MG tablet TAKE 1 TABLET BY MOUTH EVERY DAY IN THE EVENING   . buPROPion (WELLBUTRIN XL) 300 MG 24 hr tablet Take 1 tablet (300 mg total) by mouth daily.   . citalopram (CELEXA) 10 MG tablet Take 1 tablet (10 mg total) by mouth daily. 03/25/2017: The patient states that she has been increased to 20 mg.  . diphenhydrAMINE (BENADRYL) 50 MG tablet Take one tablet (50mgs total) one hour prior to study. 12/31/2016: PRN Only before studies   . furosemide (LASIX) 20 MG tablet furosemide 20 mg tablet   . ILEVRO 0.3 % ophthalmic suspension Place 1 drop into both eyes every morning. 04/24/2017: Only 1 gtt in the left eye  . ketorolac (ACULAR) 0.5 % ophthalmic solution    . letrozole (FEMARA) 2.5 MG tablet Take 1 tablet (2.5 mg total) by mouth daily.   .  levothyroxine (SYNTHROID, LEVOTHROID) 88 MCG tablet Take 1 tablet (88 mcg total) by mouth daily.   . meloxicam (MOBIC) 15 MG tablet TAKE 1 TABLET BY MOUTH EVERY DAY   . methocarbamol (ROBAXIN) 500 MG tablet TAKE 1 TABLET BY MOUTH EVERY 6 HOURS AS NEEDED SPASMS   . nitroGLYCERIN (NITROSTAT) 0.4 MG SL tablet Place 1 tablet (0.4 mg total) under the tongue every 5 (five) minutes as needed. As needed for chest pain   . ofloxacin (OCUFLOX) 0.3 % ophthalmic solution Place 1 drop into both eyes 4 (four) times daily. 04/24/2017: Only in the left bid   . Polyethyl Glycol-Propyl Glycol (SYSTANE ULTRA) 0.4-0.3 % SOLN Place 1 drop into both eyes 3 (three) times daily as needed (for dry eyes).  04/24/2017: Only use it in the right eye   . PROAIR HFA 108 (90 Base) MCG/ACT inhaler INHALE 2 PUFFS INTO THE LUNGS EVERY 6 (SIX) HOURS AS NEEDED FOR WHEEZING.   . ranitidine (ZANTAC) 150 MG tablet Take 150 mg by mouth 2 (two) times daily as needed for heartburn. Reported on 10/05/2015   . tiZANidine (ZANAFLEX) 4 MG tablet TAKE 1 TABLET BY MOUTH EVERY 8 HOURS AS NEEDED FOR SPASM    Facility-Administered Encounter Medications as of 07/23/2017  Medication  . sodium chloride flush (NS) 0.9 % injection 10 mL    Functional Status:    In your present state of health, do you have any difficulty performing the following activities: 07/08/2017 03/22/2017  Hearing? N N  Vision? N N  Difficulty concentrating or making decisions? N N  Walking or climbing stairs? Y N  Dressing or bathing? N N  Doing errands, shopping? N N  Preparing Food and eating ? - -  Using the Toilet? - -  In the past six months, have you accidently leaked urine? - -  Do you have problems with loss of bowel control? - -  Managing your Medications? - -  Managing your Finances? - -  Housekeeping or managing your Housekeeping? - -  Some recent data might be hidden    Fall/Depression Screening: Fall Risk  07/23/2017 07/08/2017 04/24/2017  Falls in the past year?  No No No  Number falls in past yr: - - -  Injury with Fall? - - -  Risk for fall due to : - - -  Risk for fall due to: Comment - - -  Follow up - - -   PHQ 2/9 Scores 07/08/2017 03/22/2017 02/22/2017 02/21/2017 02/20/2017 01/21/2017 01/04/2017  PHQ - 2 Score 0 0 _0 PHQ- 9 Score 0 0 10 10 - - 11  Exception Documentation - - - - - - -    Assessment: The patient is controlling her blood sugars and her a1c is at 6.5.  It was mutually agreed that the patient has met her goal.  Plan: Tracey Evans will close the case due to the patient has reached her goal.  Tracey Arms RN, BSN, [place]:  [phone]  Fax: [phone]

## 2017-07-30 DIAGNOSIS — H20012 Primary iridocyclitis, left eye: Secondary | ICD-10-CM | POA: Diagnosis not present

## 2017-07-30 DIAGNOSIS — Z961 Presence of intraocular lens: Secondary | ICD-10-CM | POA: Diagnosis not present

## 2017-07-31 ENCOUNTER — Other Ambulatory Visit: Payer: Self-pay | Admitting: Hematology and Oncology

## 2017-08-01 ENCOUNTER — Encounter: Payer: Self-pay | Admitting: Obstetrics & Gynecology

## 2017-08-01 ENCOUNTER — Ambulatory Visit (INDEPENDENT_AMBULATORY_CARE_PROVIDER_SITE_OTHER): Payer: Medicare Other | Admitting: Obstetrics & Gynecology

## 2017-08-01 VITALS — BP 114/78 | Ht 64.5 in | Wt 198.0 lb

## 2017-08-01 DIAGNOSIS — N898 Other specified noninflammatory disorders of vagina: Secondary | ICD-10-CM

## 2017-08-01 DIAGNOSIS — Z113 Encounter for screening for infections with a predominantly sexual mode of transmission: Secondary | ICD-10-CM | POA: Diagnosis not present

## 2017-08-01 LAB — WET PREP FOR TRICH, YEAST, CLUE

## 2017-08-01 MED ORDER — TINIDAZOLE 500 MG PO TABS: 2.0000 g | ORAL_TABLET | Freq: Every day | ORAL | 0 refills | Status: AC

## 2017-08-01 MED ORDER — FLUCONAZOLE 150 MG PO TABS: 150.0000 mg | ORAL_TABLET | Freq: Once | ORAL | 0 refills | Status: AC

## 2017-08-01 NOTE — Patient Instructions (Addendum)
1. Vaginal odor Wet prep showing numerous bacteria, but no Bacterial Vaginosis or Yeast Vaginitis or Trichomonas.  Will treat with Tinidazole.  Usage reviewed and prescription sent to pharmacy.  Fluconazole after the Antibiotics to treat or prevent Yeast Vaginitis. - WET PREP FOR [place], YEAST, CLUE  2. Screen for STD (sexually transmitted disease) Strict condom use recommended - HIV antibody (with reflex) - RPR - Hepatitis C Antibody - Hepatitis B Surface AntiGEN - C. trachomatis/N. gonorrhoeae RNA  Other orders - tinidazole (TINDAMAX) 500 MG tablet; Take 4 tablets (2,000 mg total) by mouth daily for 2 days. - fluconazole (DIFLUCAN) 150 MG tablet; Take 1 tablet (150 mg total) by mouth once for 1 dose.  Tracey Evans, it was a pleasure meeting you today!  I will inform you of your results as soon as they are available.
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
## 2017-08-02 LAB — HEPATITIS B SURFACE ANTIGEN: Hepatitis B Surface Ag: NONREACTIVE

## 2017-08-02 LAB — HEPATITIS C ANTIBODY
HEP C AB: NONREACTIVE
SIGNAL TO CUT-OFF: 0.01 (ref <1.00)

## 2017-08-02 LAB — RPR: RPR: NONREACTIVE

## 2017-08-02 LAB — C. TRACHOMATIS/N. GONORRHOEAE RNA
C. TRACHOMATIS RNA, TMA: NOT DETECTED
N. gonorrhoeae RNA, TMA: NOT DETECTED

## 2017-08-02 LAB — HIV ANTIBODY (ROUTINE TESTING W REFLEX): HIV 1&2 Ab, 4th Generation: NONREACTIVE

## 2017-08-05 ENCOUNTER — Encounter: Payer: Self-pay | Admitting: Obstetrics & Gynecology

## 2017-08-07 ENCOUNTER — Other Ambulatory Visit: Payer: Medicare Other

## 2017-08-08 ENCOUNTER — Other Ambulatory Visit (INDEPENDENT_AMBULATORY_CARE_PROVIDER_SITE_OTHER): Payer: Medicare Other

## 2017-08-08 ENCOUNTER — Other Ambulatory Visit: Payer: Self-pay

## 2017-08-08 DIAGNOSIS — R7989 Other specified abnormal findings of blood chemistry: Secondary | ICD-10-CM

## 2017-08-08 DIAGNOSIS — E039 Hypothyroidism, unspecified: Secondary | ICD-10-CM

## 2017-08-08 LAB — TSH: TSH: 0.1 u[IU]/mL — ABNORMAL LOW (ref 0.35–4.50)

## 2017-08-08 MED ORDER — LEVOTHYROXINE SODIUM 75 MCG PO TABS: 75.0000 ug | ORAL_TABLET | Freq: Every day | ORAL | 0 refills | Status: DC

## 2017-08-29 DIAGNOSIS — M25562 Pain in left knee: Secondary | ICD-10-CM | POA: Diagnosis not present

## 2017-09-03 DIAGNOSIS — H20012 Primary iridocyclitis, left eye: Secondary | ICD-10-CM | POA: Diagnosis not present

## 2017-09-03 DIAGNOSIS — Z961 Presence of intraocular lens: Secondary | ICD-10-CM | POA: Diagnosis not present

## 2017-09-07 ENCOUNTER — Other Ambulatory Visit: Payer: Self-pay | Admitting: Family Medicine

## 2017-09-09 ENCOUNTER — Encounter: Payer: Self-pay | Admitting: General Practice

## 2017-09-09 ENCOUNTER — Other Ambulatory Visit (INDEPENDENT_AMBULATORY_CARE_PROVIDER_SITE_OTHER): Payer: Medicare Other

## 2017-09-09 DIAGNOSIS — R7989 Other specified abnormal findings of blood chemistry: Secondary | ICD-10-CM

## 2017-09-09 LAB — T3, FREE: T3, Free: 3.1 pg/mL (ref 2.3–4.2)

## 2017-09-09 LAB — TSH: TSH: 1.25 u[IU]/mL (ref 0.35–4.50)

## 2017-09-09 LAB — T4, FREE: FREE T4: 0.62 ng/dL (ref 0.60–1.60)

## 2017-09-11 ENCOUNTER — Encounter: Payer: Self-pay | Admitting: Family Medicine

## 2017-09-12 DIAGNOSIS — M25562 Pain in left knee: Secondary | ICD-10-CM | POA: Diagnosis not present

## 2017-09-12 DIAGNOSIS — M25561 Pain in right knee: Secondary | ICD-10-CM | POA: Diagnosis not present

## 2017-09-17 DIAGNOSIS — M25562 Pain in left knee: Secondary | ICD-10-CM | POA: Diagnosis not present

## 2017-09-17 DIAGNOSIS — M25561 Pain in right knee: Secondary | ICD-10-CM | POA: Diagnosis not present

## 2017-09-19 DIAGNOSIS — M25562 Pain in left knee: Secondary | ICD-10-CM | POA: Diagnosis not present

## 2017-09-19 DIAGNOSIS — M25561 Pain in right knee: Secondary | ICD-10-CM | POA: Diagnosis not present

## 2017-09-24 DIAGNOSIS — M25562 Pain in left knee: Secondary | ICD-10-CM | POA: Diagnosis not present

## 2017-09-24 DIAGNOSIS — M25561 Pain in right knee: Secondary | ICD-10-CM | POA: Diagnosis not present

## 2017-09-26 DIAGNOSIS — M25562 Pain in left knee: Secondary | ICD-10-CM | POA: Diagnosis not present

## 2017-09-26 DIAGNOSIS — M25561 Pain in right knee: Secondary | ICD-10-CM | POA: Diagnosis not present

## 2017-09-28 ENCOUNTER — Other Ambulatory Visit: Payer: Self-pay | Admitting: Family Medicine

## 2017-09-30 ENCOUNTER — Other Ambulatory Visit: Payer: Self-pay | Admitting: Family Medicine

## 2017-10-01 DIAGNOSIS — Z961 Presence of intraocular lens: Secondary | ICD-10-CM | POA: Diagnosis not present

## 2017-10-01 DIAGNOSIS — H5212 Myopia, left eye: Secondary | ICD-10-CM | POA: Diagnosis not present

## 2017-10-01 DIAGNOSIS — H52223 Regular astigmatism, bilateral: Secondary | ICD-10-CM | POA: Diagnosis not present

## 2017-10-01 DIAGNOSIS — H20012 Primary iridocyclitis, left eye: Secondary | ICD-10-CM | POA: Diagnosis not present

## 2017-10-01 DIAGNOSIS — H5201 Hypermetropia, right eye: Secondary | ICD-10-CM | POA: Diagnosis not present

## 2017-10-02 DIAGNOSIS — Z803 Family history of malignant neoplasm of breast: Secondary | ICD-10-CM | POA: Diagnosis not present

## 2017-10-02 DIAGNOSIS — M940 Chondrocostal junction syndrome [Tietze]: Secondary | ICD-10-CM | POA: Diagnosis not present

## 2017-10-02 DIAGNOSIS — I10 Essential (primary) hypertension: Secondary | ICD-10-CM | POA: Diagnosis not present

## 2017-10-02 DIAGNOSIS — M797 Fibromyalgia: Secondary | ICD-10-CM | POA: Diagnosis not present

## 2017-10-02 DIAGNOSIS — C50111 Malignant neoplasm of central portion of right female breast: Secondary | ICD-10-CM | POA: Diagnosis not present

## 2017-10-03 DIAGNOSIS — M25561 Pain in right knee: Secondary | ICD-10-CM | POA: Diagnosis not present

## 2017-10-03 DIAGNOSIS — M25562 Pain in left knee: Secondary | ICD-10-CM | POA: Diagnosis not present

## 2017-10-03 IMAGING — CT CT ABD-PELV W/O CM
2 of 4 series · 17 of 46 positions shown, 19 images · non-contrast
Comparison: CT abdomen and pelvis 01/21/2012.

CLINICAL DATA: Left lower quadrant pain beginning this morning. The
patient is currently receiving chemotherapy for breast carcinoma.

EXAM:
CT ABDOMEN AND PELVIS WITHOUT CONTRAST
TECHNIQUE: Multidetector CT imaging of the abdomen and pelvis was performed
following the standard protocol without IV contrast.

[Series 2: abd/pel w/o · axial · non-contrast · 0.74mm/px · z∈[-424,-34]mm · 14 of 88 slices shown, 16 images]
[im 5/88  soft-tissue]
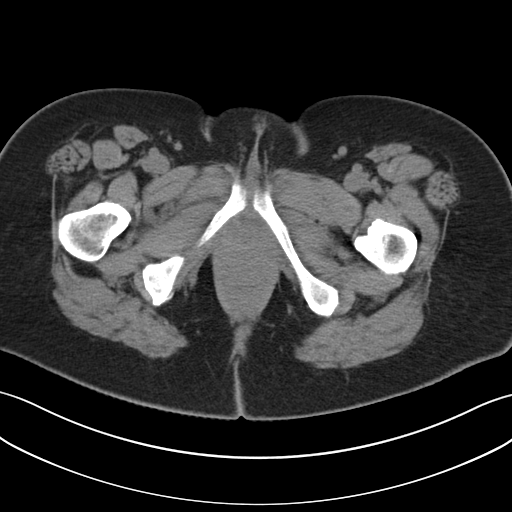
[im 5/88  bone]
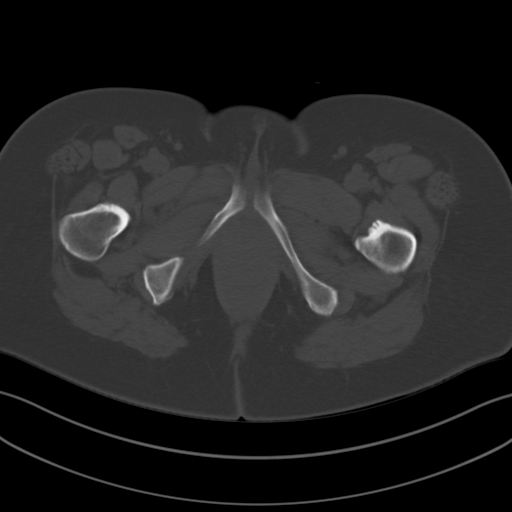
[im 10/88  soft-tissue]
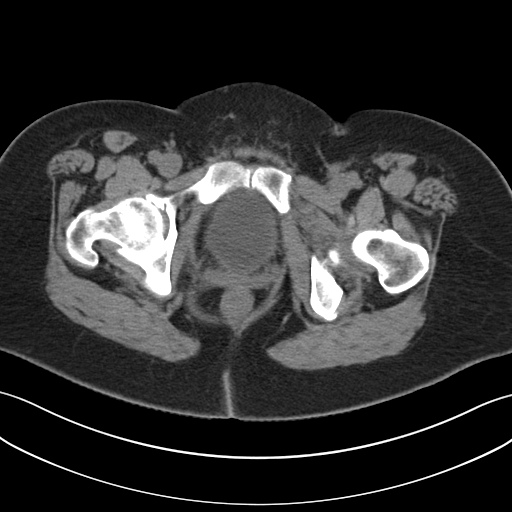
[im 20/88  soft-tissue]
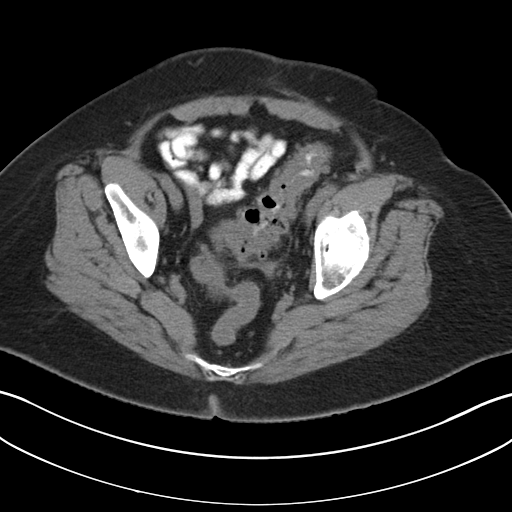
[im 25/88  soft-tissue]
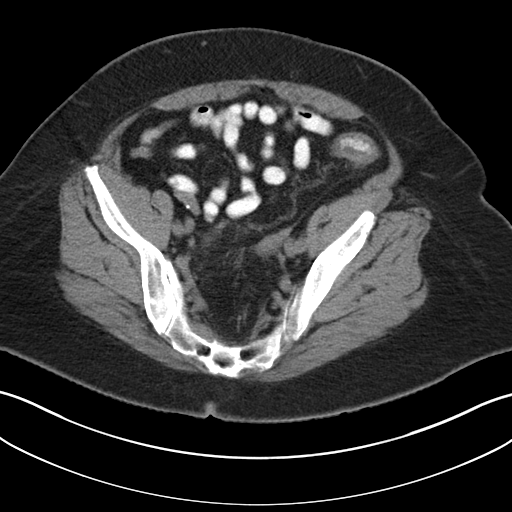
[im 30/88  soft-tissue]
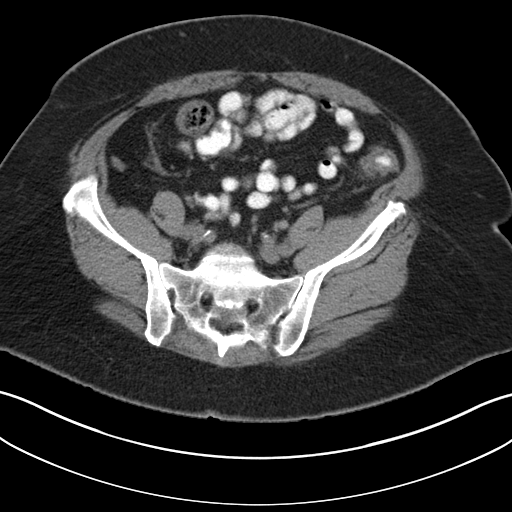
[im 34/88  soft-tissue]
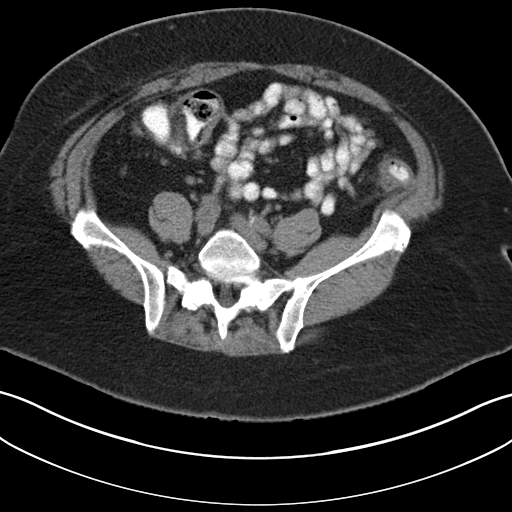
[im 39/88  soft-tissue]
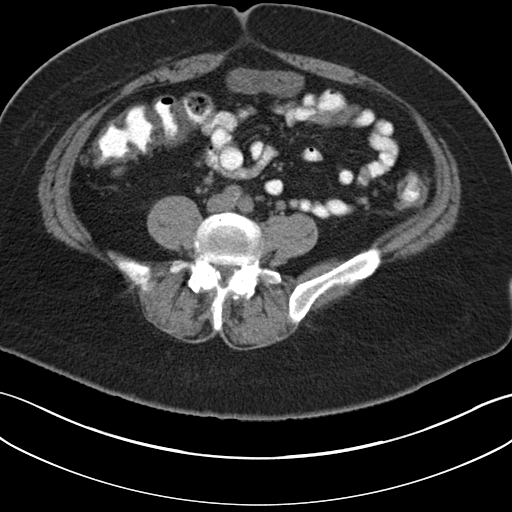
[im 49/88  soft-tissue]
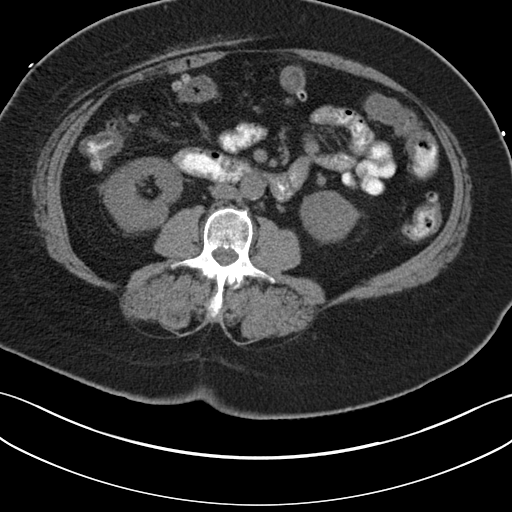
[im 54/88  soft-tissue]
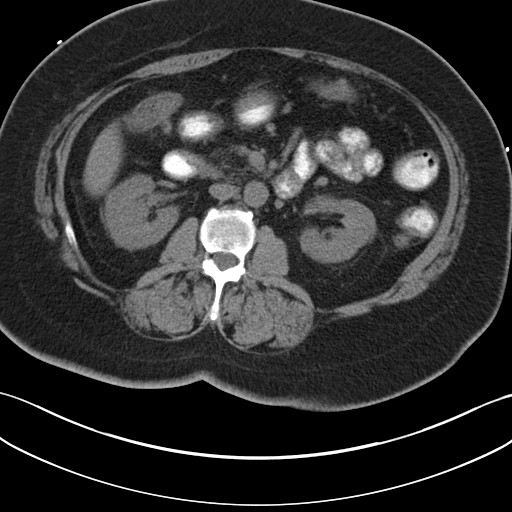
[im 54/88  bone]
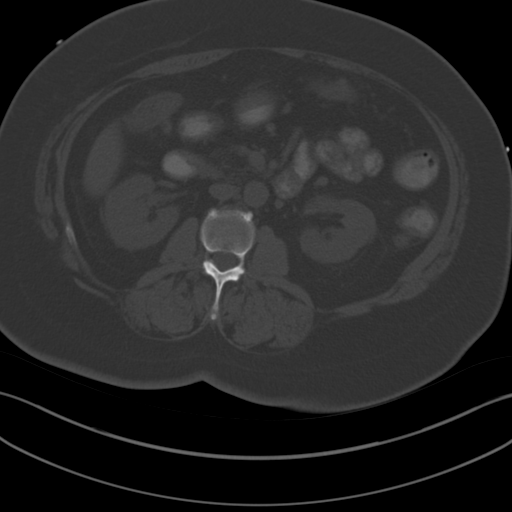
[im 59/88  soft-tissue]
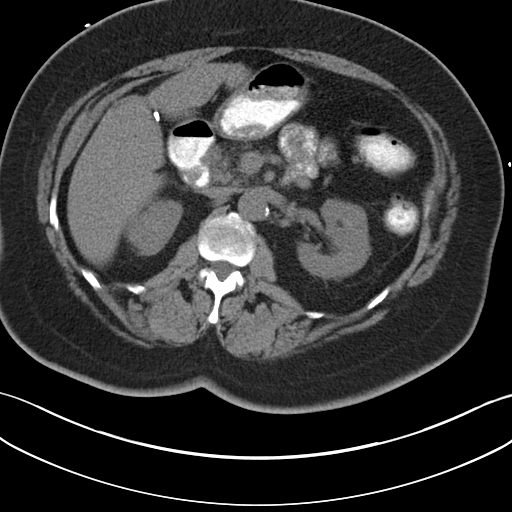
[im 63/88  soft-tissue]
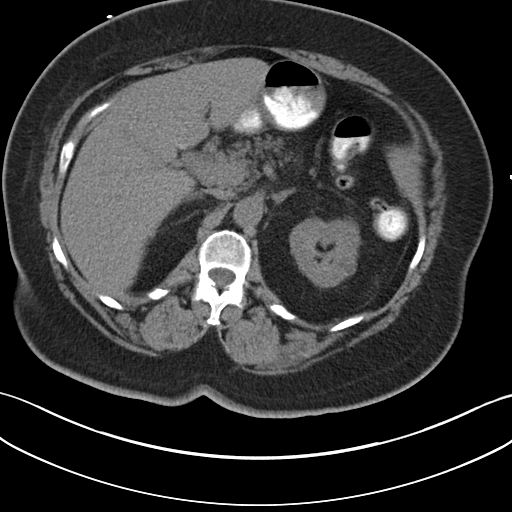
[im 68/88  soft-tissue]
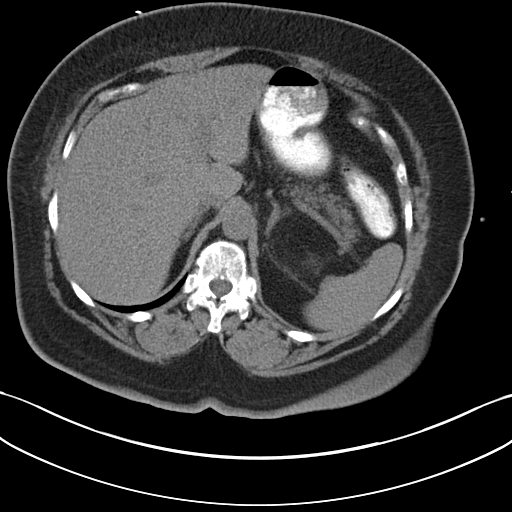
[im 78/88  soft-tissue]
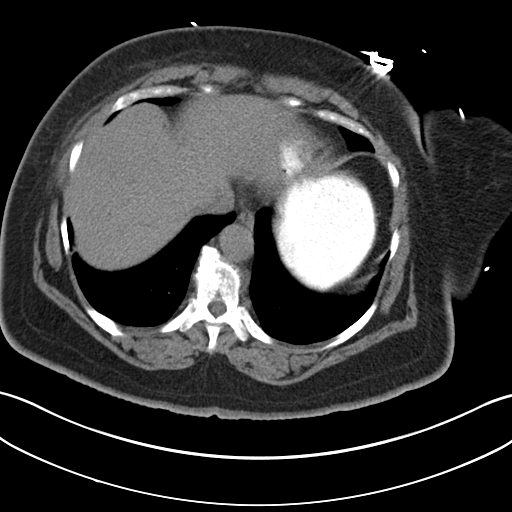
[im 83/88  soft-tissue]
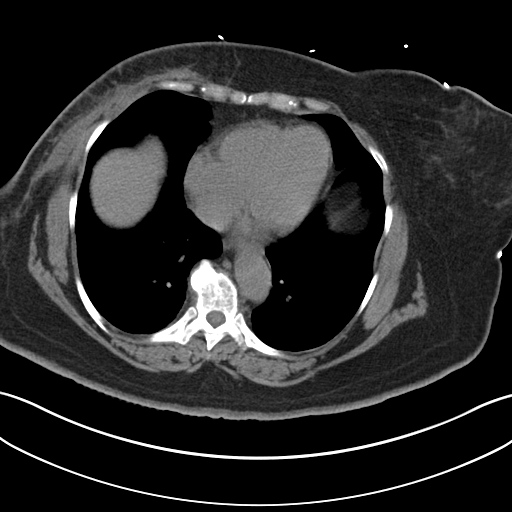

[Series 5: coronal · coronal · 0.78mm/px · 3 of 146 slices shown]
[im 49/146  soft-tissue]
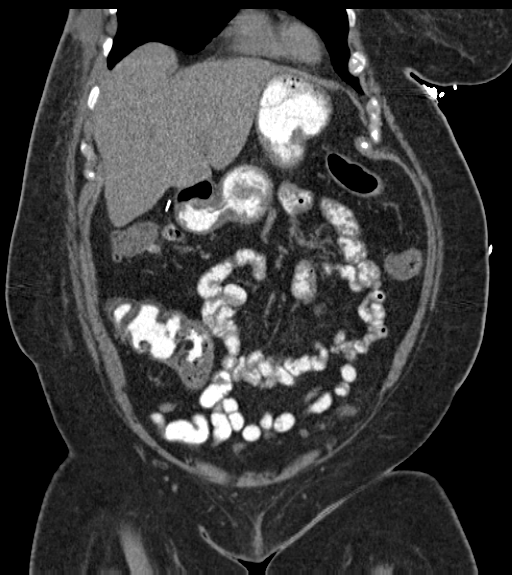
[im 65/146  soft-tissue]
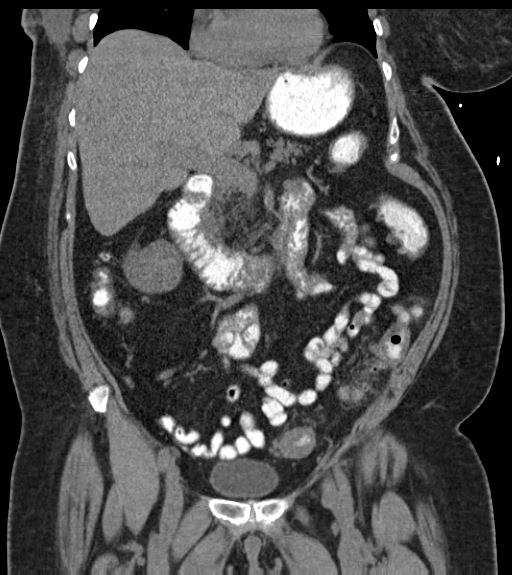
[im 81/146  soft-tissue]
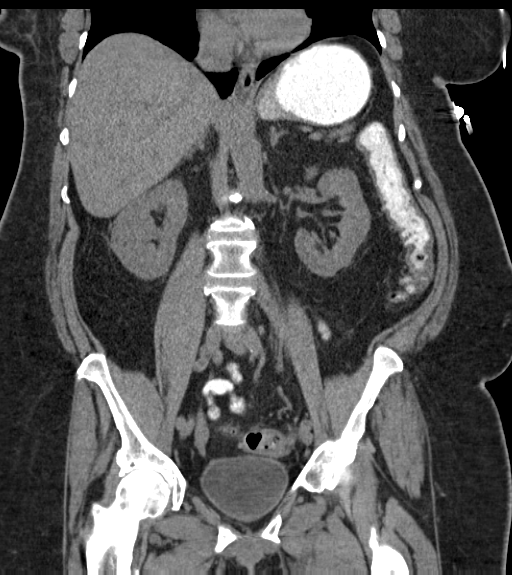

[17 of 46 positions shown; findings below may reference images not displayed]

FINDINGS: Lung bases are clear. No pleural or pericardial effusion. The
patient is status post right mastectomy.

The liver, spleen, pancreas and adrenal glands appear normal. No
renal or ureteral stones are identified. Small cyst in the upper
pole of the left kidney is noted. There is also small cyst in the
lower pole of the right kidney. The patient is status post
cholecystectomy.

The uterus has been removed. Extensive colonic diverticulosis is
identified but no evidence of diverticulitis is seen. The stomach
and small bowel appear normal. Scattered aortoiliac atherosclerosis
without aneurysm is noted. There is no lymphadenopathy or fluid.

No lytic or sclerotic bony lesion is identified. There is some
degenerative change about the hips and lower lumbar spine.
IMPRESSION: No acute abnormality abdomen or pelvis.

Extensive diverticulosis without diverticulitis.

Aortoiliac atherosclerosis without aneurysm.

Status post right mastectomy, cholecystectomy and hysterectomy.

## 2017-10-04 DIAGNOSIS — I2510 Atherosclerotic heart disease of native coronary artery without angina pectoris: Secondary | ICD-10-CM | POA: Diagnosis not present

## 2017-10-04 DIAGNOSIS — M797 Fibromyalgia: Secondary | ICD-10-CM | POA: Diagnosis not present

## 2017-10-04 DIAGNOSIS — I10 Essential (primary) hypertension: Secondary | ICD-10-CM | POA: Diagnosis not present

## 2017-10-04 DIAGNOSIS — E039 Hypothyroidism, unspecified: Secondary | ICD-10-CM | POA: Diagnosis not present

## 2017-10-04 DIAGNOSIS — M1990 Unspecified osteoarthritis, unspecified site: Secondary | ICD-10-CM | POA: Diagnosis not present

## 2017-10-04 DIAGNOSIS — E785 Hyperlipidemia, unspecified: Secondary | ICD-10-CM | POA: Diagnosis not present

## 2017-10-08 DIAGNOSIS — M25561 Pain in right knee: Secondary | ICD-10-CM | POA: Diagnosis not present

## 2017-10-08 DIAGNOSIS — M25562 Pain in left knee: Secondary | ICD-10-CM | POA: Diagnosis not present

## 2017-10-11 DIAGNOSIS — M25562 Pain in left knee: Secondary | ICD-10-CM | POA: Diagnosis not present

## 2017-10-11 DIAGNOSIS — M25561 Pain in right knee: Secondary | ICD-10-CM | POA: Diagnosis not present

## 2017-10-15 ENCOUNTER — Encounter: Payer: Self-pay | Admitting: Family Medicine

## 2017-10-15 ENCOUNTER — Telehealth: Payer: Self-pay

## 2017-10-15 ENCOUNTER — Encounter: Payer: Self-pay | Admitting: Hematology and Oncology

## 2017-10-15 NOTE — Telephone Encounter (Signed)
Responded to patient's mychart message via phone.  Patient is on Letrozole c/o severe hot flashes, bone pain, and nausea.  Patient had knee surgery in 05/2017 and did a follow up with the surgeon in 4/19, per patient surgeon did not think that was arthritis related.  Patient states symptoms have been going on for 3 months.  Pain is constant throughout the day.  No relief with rest.  Per Dr. Geralyn Flash recommendations hold Letrozole X 2 weeks to see if symptoms subside.  Patient voiced understanding and agreement.  Patient to call office in 2 weeks to follow up.

## 2017-10-18 LAB — GLUCOSE, POCT (MANUAL RESULT ENTRY): POC Glucose: 113 mg/dl — AB (ref 70–99)

## 2017-10-29 ENCOUNTER — Encounter: Payer: Self-pay | Admitting: Family Medicine

## 2017-10-29 ENCOUNTER — Other Ambulatory Visit: Payer: Self-pay | Admitting: Family Medicine

## 2017-10-29 NOTE — Telephone Encounter (Signed)
Last OV 07/08/17 Alprazolam last filled 07/08/17 #60 with 1

## 2017-10-30 DIAGNOSIS — M1711 Unilateral primary osteoarthritis, right knee: Secondary | ICD-10-CM | POA: Diagnosis not present

## 2017-10-30 DIAGNOSIS — M25561 Pain in right knee: Secondary | ICD-10-CM | POA: Diagnosis not present

## 2017-10-30 DIAGNOSIS — M1712 Unilateral primary osteoarthritis, left knee: Secondary | ICD-10-CM | POA: Diagnosis not present

## 2017-10-30 DIAGNOSIS — M25562 Pain in left knee: Secondary | ICD-10-CM | POA: Diagnosis not present

## 2017-12-03 ENCOUNTER — Telehealth: Payer: Self-pay | Admitting: Hematology and Oncology

## 2017-12-03 ENCOUNTER — Inpatient Hospital Stay: Payer: Medicare Other | Attending: Hematology and Oncology | Admitting: Hematology and Oncology

## 2017-12-03 DIAGNOSIS — Z170 Estrogen receptor positive status [ER+]: Secondary | ICD-10-CM | POA: Insufficient documentation

## 2017-12-03 DIAGNOSIS — C50511 Malignant neoplasm of lower-outer quadrant of right female breast: Secondary | ICD-10-CM | POA: Insufficient documentation

## 2017-12-03 DIAGNOSIS — Z79811 Long term (current) use of aromatase inhibitors: Secondary | ICD-10-CM | POA: Insufficient documentation

## 2017-12-03 MED ORDER — LETROZOLE 2.5 MG PO TABS: 2.5000 mg | ORAL_TABLET | Freq: Every day | ORAL | 3 refills | Status: DC

## 2017-12-03 NOTE — Progress Notes (Signed)
Patient Care Team: Midge Minium, MD as PCP - General [name], MD as Consulting Physician (Cardiology) Marylynn Pearson, MD as Consulting Physician (Ophthalmology) Nicholas Lose, MD as Consulting Physician (Hematology and Oncology) Kyung Rudd, MD as Consulting Physician (Radiation Oncology) Fanny Skates, MD as Consulting Physician (General Surgery) [place], [place] (Specialist) Suella Broad, MD as Consulting Physician (Physical Medicine and Rehabilitation) Delice Bison, Charlestine Massed, NP as Nurse Practitioner (Hematology and Oncology)  DIAGNOSIS:  Encounter Diagnosis  Name Primary?  . 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, heterozygous    08/15/2015 Surgery    Right mastectomy: IDC grade 2, 2.2 cm, with associated DCIS intermediate grade, separate focus high-grade DCIS, 0/4 lymph nodes negative, T2 N0 stage II a, ER 90%, PR 5%, HER-2 negative duration 1.42, Ki-67 60%    09/22/2015 - 01/05/2016 Chemotherapy    Adjuvant chemotherapy with TCH 5 cycles followed by Herceptin maintenance for 1 year    07/2016 -  Anti-estrogen oral therapy    Letrozole daily     CHIEF COMPLIANT: Follow-up on letrozole therapy  INTERVAL HISTORY: Tracey Evans is a 72 year old with above-mentioned history of right breast cancer treated with mastectomy adjuvant chemotherapy and is currently on letrozole therapy.  She was tolerating letrozole fairly well  up until a month ago when she felt really achy and hurt for all over and then she decided to stop letrozole..  She denies any lumps or nodules in the breast.  She had prior right mastectomy.  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  I have reviewed the past medical history, past surgical history, social history and family history with the patient and they are unchanged from previous note.  ALLERGIES:  is allergic to contrast media [iodinated diagnostic agents]; dilaudid [hydromorphone hcl]; adhesive [tape]; codeine; and lactose intolerance (gi).  MEDICATIONS:  Current Outpatient Medications  Medication Sig Dispense Refill  . ALPRAZolam (XANAX) 1 MG tablet TAKE 1 TABLET (1 MG TOTAL) BY MOUTH 2 (TWO) TIMES DAILY AS NEEDED. 60 tablet 1  . amLODipine (NORVASC) 2.5 MG tablet TAKE 1 TABLET BY MOUTH EVERY DAY 90 tablet 1  . aspirin 81 MG tablet Take 81 mg by mouth daily.    Marland Kitchen atorvastatin (LIPITOR) 20 MG tablet TAKE 1 TABLET BY MOUTH EVERY DAY IN THE EVENING 90 tablet 1  . buPROPion (WELLBUTRIN XL) 300 MG  24 hr tablet TAKE 1 TABLET BY MOUTH EVERY DAY 90 tablet 1  . citalopram (CELEXA) 10 MG tablet TAKE 1 TABLET BY MOUTH EVERY DAY 90 tablet 1  . diphenhydrAMINE  (BENADRYL) 50 MG tablet Take one tablet (73ms total) one hour prior to study. 1 tablet 0  . furosemide (LASIX) 20 MG tablet furosemide 20 mg tablet    . ILEVRO 0.3 % ophthalmic suspension Place 1 drop into both eyes every morning.  6  . ketorolac (ACULAR) 0.5 % ophthalmic solution     . letrozole (FEMARA) 2.5 MG tablet TAKE 1 TABLET (2.5 MG TOTAL) BY MOUTH DAILY. 90 tablet 3  . levothyroxine (SYNTHROID, LEVOTHROID) 75 MCG tablet TAKE 1 TABLET BY MOUTH EVERY DAY 90 tablet 0  . meloxicam (MOBIC) 15 MG tablet TAKE 1 TABLET BY MOUTH EVERY DAY 30 tablet 1  . methocarbamol (ROBAXIN) 500 MG tablet TAKE 1 TABLET BY MOUTH EVERY 6 HOURS AS NEEDED SPASMS  1  . nitroGLYCERIN (NITROSTAT) 0.4 MG SL tablet Place 1 tablet (0.4 mg total) under the tongue every 5 (five) minutes as needed. As needed for chest pain 30 tablet 3  . ofloxacin (OCUFLOX) 0.3 % ophthalmic solution Place 1 drop into both eyes 4 (four) times daily.  6  . Polyethyl Glycol-Propyl Glycol (SYSTANE ULTRA) 0.4-0.3 % SOLN Place 1 drop into both eyes 3 (three) times daily as needed (for dry eyes).     .Marland KitchenPROAIR HFA 108 (90 Base) MCG/ACT inhaler INHALE 2 PUFFS INTO THE LUNGS EVERY 6 (SIX) HOURS AS NEEDED FOR WHEEZING. 8.5 Inhaler 0  . ranitidine (ZANTAC) 150 MG tablet Take 150 mg by mouth 2 (two) times daily as needed for heartburn. Reported on 10/05/2015    . tiZANidine (ZANAFLEX) 4 MG tablet TAKE 1 TABLET BY MOUTH EVERY 8 HOURS AS NEEDED FOR SPASM  0   No current facility-administered medications for this visit.    Facility-Administered Medications Ordered in Other Visits  Medication Dose Route Frequency Provider Last Rate Last Dose  . sodium chloride flush (NS) 0.9 % injection 10 mL  10 mL Intracatheter PRN GNicholas Lose MD   10 mL at 12/15/15 1439    PHYSICAL EXAMINATION: ECOG PERFORMANCE STATUS: 1 - Symptomatic but completely ambulatory  Vitals:   12/03/17 0923  BP: (!) 132/92  Pulse: 70  Resp: 17  Temp: 97.7 F (36.5 C)  SpO2: 99%     Filed Weights   12/03/17 0923  Weight: 201 lb 11.2 oz (91.5 kg)    GENERAL:alert, no distress and comfortable SKIN: skin color, texture, turgor are normal, no rashes or significant lesions EYES: normal, Conjunctiva are pink and non-injected, sclera clear OROPHARYNX:no exudate, no erythema and lips, buccal mucosa, and tongue normal  NECK: supple, thyroid normal size, non-tender, without nodularity LYMPH:  no palpable lymphadenopathy in the cervical, axillary or inguinal LUNGS: clear to auscultation and percussion with normal breathing effort HEART: regular rate & rhythm and no murmurs and no lower extremity edema ABDOMEN:abdomen soft, non-tender and normal bowel sounds MUSCULOSKELETAL:no cyanosis of digits and no clubbing  NEURO: alert & oriented x 3 with fluent speech, no focal motor/sensory deficits EXTREMITIES: No lower extremity edema   LABORATORY DATA:  I have reviewed the data as listed CMP Latest Ref Rng & Units 07/08/2017 03/22/2017 11/30/2016  Glucose 70 - 99 mg/dL 113(H) 93 127  BUN 6 - 23 mg/dL 15 17 17.3  Creatinine 0.40 - 1.20 mg/dL 0.95 1.04 1.1  Sodium 135 - 145 mEq/L 143 142  143  Potassium 3.5 - 5.1 mEq/L 4.5 4.6 4.2  Chloride 96 - 112 mEq/L 107 104 -  CO2 19 - 32 mEq/L 29 34(H) 25  Calcium 8.4 - 10.5 mg/dL 9.7 9.0 9.7  Total Protein 6.0 - 8.3 g/dL 6.5 6.0 6.7  Total Bilirubin 0.2 - 1.2 mg/dL 0.4 0.6 0.49  Alkaline Phos 39 - 117 U/L 88 87 102  AST 0 - 37 U/L '12 14 16  ' ALT 0 - 35 U/L '12 25 21    ' Lab Results  Component Value Date   WBC 6.9 07/08/2017   HGB 13.2 07/08/2017   HCT 40.0 07/08/2017   MCV 93.7 07/08/2017   PLT 261.0 07/08/2017   NEUTROABS 3.7 07/08/2017    ASSESSMENT & PLAN:  Breast cancer of lower-outer quadrant of right female breast ([place]) Right mastectomy 08/15/2015: IDC grade 2, 2.2 cm, with associated DCIS intermediate grade, separate focus high-grade DCIS, 0/4 lymph nodes negative, T2 N0 stage II a, ER 90%, PR 5%, HER-2 negative duration  1.42, Ki-67 60%  Treatment plan: 1. Adjuvant chemotherapy with [place] 2. Followed by adjuvant antiestrogen therapy with anastrozole 5 years No role of radiation since she had mastectomy. ----------------------------------------------------------------------------------------------------------------------- Priortreatment: Completed 5 cycles ofTCH, chemotherapy discontinued for neuropathy. Completed Herceptin maintenance 09/29/2006   Acute Diverticulitis: ED visit 02/22/16 Shingles: Resolved  Plan: 1.Anastrozole 1 mg daily startedin 03/15/16 discontinued 04/25/2016 because of pain in hands and feet,switched her to tamoxifen therapy. But she stopped taking tamoxifen as well. 3. Started letrozole 07/31/2016 stopped July 2019, resumed August 2019  CT abdomen and pelvis 07/30/2016: Normal study  Letrozole toxicities:Patient had profound aches and pains and hot flashes and myalgias for 1 month in July and she stopped it.  She felt much better after she stopped it.  She would like to resume it today.  Breast cancer surveillance: 1.  Breast exam 12/03/2017: Benign left breast.  Right mastectomy. 2. mammogram 07/22/2017: No evidence of malignancy breast density category B  Return to clinic in one year for follow-up    No orders of the defined types were placed in this encounter.  The patient has a good understanding of the overall plan. she agrees with it. she will call with any problems that may develop before the next visit here.   Harriette Ohara, MD 12/03/17

## 2017-12-03 NOTE — Telephone Encounter (Signed)
Gave pt avs and calendar  °

## 2017-12-03 NOTE — Assessment & Plan Note (Signed)
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 Adjuvant chemotherapy with [place] 2. Followed by adjuvant antiestrogen therapy with anastrozole 5 years No role of radiation since she had mastectomy. ----------------------------------------------------------------------------------------------------------------------- Priortreatment: Completed 5 cycles ofTCH, chemotherapy discontinued for neuropathy. Completed Herceptin maintenance 09/29/2006   Acute Diverticulitis: ED visit 02/22/16 Shingles: Resolved  Plan: 1.Anastrozole 1 mg daily startedin 03/15/16 discontinued 04/25/2016 because of pain in hands and feet,switched her to tamoxifen therapy. But she stopped taking tamoxifen as well. 3. Started letrozole 07/31/2016  CT abdomen and pelvis 07/30/2016: Normal study  Letrozole toxicities:Denies any hot flashes or myalgias. Tolerating it extremely well.  Breast cancer surveillance: 1.  Breast exam 12/03/2017: Benign 2. mammogram 07/22/2017: No evidence of malignancy breast density category B  Return to clinic in one year for follow-up

## 2017-12-17 ENCOUNTER — Encounter: Payer: Self-pay | Admitting: Family Medicine

## 2017-12-18 MED ORDER — ALBUTEROL SULFATE HFA 108 (90 BASE) MCG/ACT IN AERS: 2.0000 | INHALATION_SPRAY | Freq: Four times a day (QID) | RESPIRATORY_TRACT | 3 refills | Status: DC | PRN

## 2017-12-19 ENCOUNTER — Other Ambulatory Visit: Payer: Self-pay | Admitting: Nurse Practitioner

## 2018-01-08 ENCOUNTER — Other Ambulatory Visit: Payer: Self-pay | Admitting: Family Medicine

## 2018-01-09 NOTE — Telephone Encounter (Signed)
Last OV: 07/08/2017 Last Fill: 10/29/2017 #60 with 1 RF

## 2018-01-16 DIAGNOSIS — M797 Fibromyalgia: Secondary | ICD-10-CM | POA: Diagnosis not present

## 2018-01-16 DIAGNOSIS — E785 Hyperlipidemia, unspecified: Secondary | ICD-10-CM | POA: Diagnosis not present

## 2018-01-16 DIAGNOSIS — I2510 Atherosclerotic heart disease of native coronary artery without angina pectoris: Secondary | ICD-10-CM | POA: Diagnosis not present

## 2018-01-16 DIAGNOSIS — E039 Hypothyroidism, unspecified: Secondary | ICD-10-CM | POA: Diagnosis not present

## 2018-01-16 DIAGNOSIS — I10 Essential (primary) hypertension: Secondary | ICD-10-CM | POA: Diagnosis not present

## 2018-01-16 DIAGNOSIS — M1990 Unspecified osteoarthritis, unspecified site: Secondary | ICD-10-CM | POA: Diagnosis not present

## 2018-02-11 DIAGNOSIS — M797 Fibromyalgia: Secondary | ICD-10-CM | POA: Diagnosis not present

## 2018-02-11 DIAGNOSIS — M5136 Other intervertebral disc degeneration, lumbar region: Secondary | ICD-10-CM | POA: Diagnosis not present

## 2018-02-11 DIAGNOSIS — M2550 Pain in unspecified joint: Secondary | ICD-10-CM | POA: Diagnosis not present

## 2018-02-11 DIAGNOSIS — M79642 Pain in left hand: Secondary | ICD-10-CM | POA: Diagnosis not present

## 2018-02-11 DIAGNOSIS — M79643 Pain in unspecified hand: Secondary | ICD-10-CM | POA: Diagnosis not present

## 2018-02-11 DIAGNOSIS — Z23 Encounter for immunization: Secondary | ICD-10-CM | POA: Diagnosis not present

## 2018-02-11 DIAGNOSIS — M19042 Primary osteoarthritis, left hand: Secondary | ICD-10-CM | POA: Diagnosis not present

## 2018-02-11 DIAGNOSIS — M19041 Primary osteoarthritis, right hand: Secondary | ICD-10-CM | POA: Diagnosis not present

## 2018-02-11 DIAGNOSIS — M79641 Pain in right hand: Secondary | ICD-10-CM | POA: Diagnosis not present

## 2018-02-11 DIAGNOSIS — M150 Primary generalized (osteo)arthritis: Secondary | ICD-10-CM | POA: Diagnosis not present

## 2018-02-20 DIAGNOSIS — M5134 Other intervertebral disc degeneration, thoracic region: Secondary | ICD-10-CM | POA: Diagnosis not present

## 2018-03-21 ENCOUNTER — Other Ambulatory Visit: Payer: Self-pay | Admitting: Family Medicine

## 2018-03-24 ENCOUNTER — Other Ambulatory Visit: Payer: Self-pay | Admitting: General Practice

## 2018-03-24 NOTE — Telephone Encounter (Signed)
Last OV 07/08/17 Alprazolam last filled 01/10/18 #60 with 1

## 2018-03-25 MED ORDER — ALPRAZOLAM 1 MG PO TABS: 1.0000 mg | ORAL_TABLET | Freq: Two times a day (BID) | ORAL | 1 refills | Status: DC | PRN

## 2018-03-28 DIAGNOSIS — G894 Chronic pain syndrome: Secondary | ICD-10-CM | POA: Diagnosis not present

## 2018-03-28 DIAGNOSIS — Z79899 Other long term (current) drug therapy: Secondary | ICD-10-CM | POA: Diagnosis not present

## 2018-03-28 DIAGNOSIS — M170 Bilateral primary osteoarthritis of knee: Secondary | ICD-10-CM | POA: Diagnosis not present

## 2018-03-28 DIAGNOSIS — M25562 Pain in left knee: Secondary | ICD-10-CM | POA: Diagnosis not present

## 2018-03-28 DIAGNOSIS — M25561 Pain in right knee: Secondary | ICD-10-CM | POA: Diagnosis not present

## 2018-03-28 DIAGNOSIS — Z5181 Encounter for therapeutic drug level monitoring: Secondary | ICD-10-CM | POA: Diagnosis not present

## 2018-03-28 DIAGNOSIS — G588 Other specified mononeuropathies: Secondary | ICD-10-CM | POA: Diagnosis not present

## 2018-04-18 DIAGNOSIS — I10 Essential (primary) hypertension: Secondary | ICD-10-CM | POA: Diagnosis not present

## 2018-04-18 DIAGNOSIS — E785 Hyperlipidemia, unspecified: Secondary | ICD-10-CM | POA: Diagnosis not present

## 2018-04-18 DIAGNOSIS — M1990 Unspecified osteoarthritis, unspecified site: Secondary | ICD-10-CM | POA: Diagnosis not present

## 2018-04-18 DIAGNOSIS — E039 Hypothyroidism, unspecified: Secondary | ICD-10-CM | POA: Diagnosis not present

## 2018-04-18 DIAGNOSIS — I2510 Atherosclerotic heart disease of native coronary artery without angina pectoris: Secondary | ICD-10-CM | POA: Diagnosis not present

## 2018-04-18 DIAGNOSIS — M797 Fibromyalgia: Secondary | ICD-10-CM | POA: Diagnosis not present

## 2018-04-21 DIAGNOSIS — I10 Essential (primary) hypertension: Secondary | ICD-10-CM | POA: Diagnosis not present

## 2018-04-21 DIAGNOSIS — J45909 Unspecified asthma, uncomplicated: Secondary | ICD-10-CM | POA: Diagnosis not present

## 2018-04-21 DIAGNOSIS — Z91048 Other nonmedicinal substance allergy status: Secondary | ICD-10-CM | POA: Diagnosis not present

## 2018-04-21 DIAGNOSIS — F329 Major depressive disorder, single episode, unspecified: Secondary | ICD-10-CM | POA: Diagnosis not present

## 2018-04-21 DIAGNOSIS — Z91041 Radiographic dye allergy status: Secondary | ICD-10-CM | POA: Diagnosis not present

## 2018-04-21 DIAGNOSIS — Z885 Allergy status to narcotic agent status: Secondary | ICD-10-CM | POA: Diagnosis not present

## 2018-04-21 DIAGNOSIS — R0789 Other chest pain: Secondary | ICD-10-CM | POA: Diagnosis not present

## 2018-04-21 DIAGNOSIS — E039 Hypothyroidism, unspecified: Secondary | ICD-10-CM | POA: Diagnosis not present

## 2018-04-21 DIAGNOSIS — G580 Intercostal neuropathy: Secondary | ICD-10-CM | POA: Diagnosis not present

## 2018-04-29 ENCOUNTER — Telehealth: Payer: Self-pay

## 2018-04-29 NOTE — Telephone Encounter (Signed)
Pt called to report that she is having very severe aches and pain with letrozole medication and cannot tolerate taking it any longer. She has tried tamoxifen and anastrozole with similar side effects as well. She does not know what to do and will no longer take this medication. Arranged for pt to see Dr.Gudena next week and discuss her treatment options for antiestrogen therapy. Pt confirmed time/date for next week.

## 2018-04-30 ENCOUNTER — Other Ambulatory Visit: Payer: Self-pay

## 2018-04-30 ENCOUNTER — Ambulatory Visit (INDEPENDENT_AMBULATORY_CARE_PROVIDER_SITE_OTHER): Payer: Medicare Other | Admitting: Family Medicine

## 2018-04-30 ENCOUNTER — Encounter: Payer: Self-pay | Admitting: Family Medicine

## 2018-04-30 VITALS — BP 124/80 | HR 78 | Temp 98.3°F | Resp 17 | Ht 65.0 in | Wt 209.0 lb

## 2018-04-30 DIAGNOSIS — E1169 Type 2 diabetes mellitus with other specified complication: Secondary | ICD-10-CM | POA: Diagnosis not present

## 2018-04-30 DIAGNOSIS — E119 Type 2 diabetes mellitus without complications: Secondary | ICD-10-CM | POA: Diagnosis not present

## 2018-04-30 DIAGNOSIS — E785 Hyperlipidemia, unspecified: Secondary | ICD-10-CM

## 2018-04-30 DIAGNOSIS — F332 Major depressive disorder, recurrent severe without psychotic features: Secondary | ICD-10-CM | POA: Diagnosis not present

## 2018-04-30 LAB — CBC WITH DIFFERENTIAL/PLATELET
Basophils Absolute: 0.1 10*3/uL (ref 0.0–0.1)
Basophils Relative: 0.7 % (ref 0.0–3.0)
Eosinophils Absolute: 0.3 10*3/uL (ref 0.0–0.7)
Eosinophils Relative: 3.5 % (ref 0.0–5.0)
HCT: 41 % (ref 36.0–46.0)
Hemoglobin: 13.5 g/dL (ref 12.0–15.0)
LYMPHS ABS: 2.8 10*3/uL (ref 0.7–4.0)
Lymphocytes Relative: 37.3 % (ref 12.0–46.0)
MCHC: 33 g/dL (ref 30.0–36.0)
MCV: 92.2 fl (ref 78.0–100.0)
Monocytes Absolute: 0.4 10*3/uL (ref 0.1–1.0)
Monocytes Relative: 5.9 % (ref 3.0–12.0)
NEUTROS PCT: 52.6 % (ref 43.0–77.0)
Neutro Abs: 4 10*3/uL (ref 1.4–7.7)
Platelets: 263 10*3/uL (ref 150.0–400.0)
RBC: 4.44 Mil/uL (ref 3.87–5.11)
RDW: 14.4 % (ref 11.5–15.5)
WBC: 7.6 10*3/uL (ref 4.0–10.5)

## 2018-04-30 LAB — HEPATIC FUNCTION PANEL
ALK PHOS: 120 U/L — AB (ref 39–117)
ALT: 25 U/L (ref 0–35)
AST: 18 U/L (ref 0–37)
Albumin: 4.2 g/dL (ref 3.5–5.2)
Bilirubin, Direct: 0.1 mg/dL (ref 0.0–0.3)
Total Bilirubin: 0.3 mg/dL (ref 0.2–1.2)
Total Protein: 6.7 g/dL (ref 6.0–8.3)

## 2018-04-30 LAB — BASIC METABOLIC PANEL
BUN: 13 mg/dL (ref 6–23)
CALCIUM: 10 mg/dL (ref 8.4–10.5)
CHLORIDE: 104 meq/L (ref 96–112)
CO2: 30 meq/L (ref 19–32)
CREATININE: 0.97 mg/dL (ref 0.40–1.20)
GFR: 68.25 mL/min (ref >60.00)
GLUCOSE: 101 mg/dL — AB (ref 70–99)
Potassium: 4.9 mEq/L (ref 3.5–5.1)
SODIUM: 141 meq/L (ref 135–145)

## 2018-04-30 LAB — LIPID PANEL
CHOLESTEROL: 166 mg/dL (ref 0–200)
HDL: 45.8 mg/dL (ref >39.00)
LDL Cholesterol: 102 mg/dL — ABNORMAL HIGH (ref 0–99)
NONHDL: 120.24
Total CHOL/HDL Ratio: 4
Triglycerides: 89 mg/dL (ref 0.0–149.0)
VLDL: 17.8 mg/dL (ref 0.0–40.0)

## 2018-04-30 LAB — MICROALBUMIN / CREATININE URINE RATIO
Creatinine,U: 220.6 mg/dL
Microalb Creat Ratio: 0.6 mg/g (ref 0.0–30.0)
Microalb, Ur: 1.3 mg/dL (ref 0.0–1.9)

## 2018-04-30 LAB — TSH: TSH: 3.03 u[IU]/mL (ref 0.35–4.50)

## 2018-04-30 LAB — HEMOGLOBIN A1C: Hgb A1c MFr Bld: 6.5 % (ref 4.6–6.5)

## 2018-04-30 MED ORDER — CITALOPRAM HYDROBROMIDE 20 MG PO TABS: 20.0000 mg | ORAL_TABLET | Freq: Every day | ORAL | 3 refills | Status: DC

## 2018-04-30 NOTE — Patient Instructions (Signed)
Follow up in 4-6 weeks to recheck mood We'll notify you of your lab results and make any changes if needed Increase the Citalopram to 20mg  daily- a new prescription was sent Make sure you are limiting your sugar intake and eating regularly!  M&Ms and milkshakes are not the answer!!! Continue to speak w/ your pastor.  I'm glad you have someone to talk to!! Try and set a schedule for your day- this will prevent your days and nights from being switched Call with any questions or concerns Hang in there!!!

## 2018-04-30 NOTE — Assessment & Plan Note (Signed)
Chronic problem.  Currently diet controlled but in reviewing pt's dietary habits with her, have serious concerns.  UTD on foot exam, eye exam scheduled for tomorrow.  Check microalbumin.  Stressed need for diet changes.  Will follow.

## 2018-04-30 NOTE — Progress Notes (Signed)
   Subjective:    Patient ID: Tracey Evans, female    DOB: 1946-02-19, 73 y.o.   MRN: [id number]  HPI Depression- pt's PHQ score 15 today.  On Citalopram 10mg  daily, Wellbutrin 300mg  daily.  No longer in counseling.  'I feel like i'm the most useless person in the world'.  No longer cares if house is clean.  Won't wash dishes 'for weeks at a time'.  'I sit in my recliner, watch tv and crochet'.  Is now sleeping 12-15 hrs/day.  The increased sleep is 'bothering me'.  Due to excessive napping, days and nights are flipped.  Pt has been taking 20mg  of Citalopram (2 tabs daily) x2 weeks.  DM- pt has not kept up w/ DM appts.  UTD on foot exam, eye exam scheduled for tomorrow.  Overdue on microalbumin.  Has been diet controlled.  Last A1C 6.5  Pt is eating 'almost 2 lbs of peanut M&Ms a day'.  Eating 1-2 [employer] shakes/day,  Pt has gained 7 lbs   Review of Systems For ROS see HPI     Objective:   Physical Exam Vitals signs reviewed.  Constitutional:      General: She is not in acute distress.    Appearance: She is well-developed. She is obese.  HENT:     Head: Normocephalic and atraumatic.  Eyes:     Conjunctiva/sclera: Conjunctivae normal.     Pupils: Pupils are equal, round, and reactive to light.  Neck:     Musculoskeletal: Normal range of motion and neck supple.     Thyroid: No thyromegaly.  Cardiovascular:     Rate and Rhythm: Normal rate and regular rhythm.     Heart sounds: Normal heart sounds. No murmur.  Pulmonary:     Effort: Pulmonary effort is normal. No respiratory distress.     Breath sounds: Normal breath sounds.  Abdominal:     General: There is no distension.     Palpations: Abdomen is soft.     Tenderness: There is no abdominal tenderness.  Lymphadenopathy:     Cervical: No cervical adenopathy.  Skin:    General: Skin is warm and dry.  Neurological:     Mental Status: She is alert and oriented to person, place, and time.  Psychiatric:        Behavior:  Behavior normal.           Assessment & Plan:

## 2018-04-30 NOTE — Assessment & Plan Note (Signed)
Deteriorated.  Pt is again sitting in chair all day doing nothing.  Has a pastor she can talk to- she feels very comfortable w/ him.  Encouraged her to do so.  Will increase Citalopram to 20mg  daily and follow closely.  Pt expressed understanding and is in agreement w/ plan.

## 2018-04-30 NOTE — Assessment & Plan Note (Signed)
Chronic problem.  Tolerating statin w/o difficulty.  Check labs.  Adjust meds prn  

## 2018-05-01 ENCOUNTER — Other Ambulatory Visit: Payer: Self-pay | Admitting: General Practice

## 2018-05-01 ENCOUNTER — Encounter: Payer: Self-pay | Admitting: General Practice

## 2018-05-01 DIAGNOSIS — H04123 Dry eye syndrome of bilateral lacrimal glands: Secondary | ICD-10-CM | POA: Diagnosis not present

## 2018-05-01 LAB — HM DIABETES EYE EXAM

## 2018-05-01 MED ORDER — ATORVASTATIN CALCIUM 20 MG PO TABS: ORAL_TABLET | ORAL | 1 refills | Status: DC

## 2018-05-01 NOTE — Progress Notes (Signed)
Patient Care Team: Midge Minium, MD as PCP - General [name], MD as Consulting Physician (Cardiology) Marylynn Pearson, MD as Consulting Physician (Ophthalmology) Nicholas Lose, MD as Consulting Physician (Hematology and Oncology) Kyung Rudd, MD as Consulting Physician (Radiation Oncology) Fanny Skates, MD as Consulting Physician (General Surgery) [facility] (Specialist) Suella Broad, MD as Consulting Physician (Physical Medicine and Rehabilitation) Delice Bison, Charlestine Massed, NP as Nurse Practitioner (Hematology and Oncology)  DIAGNOSIS:    ICD-10-CM   1. 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, heterozygous    08/15/2015 Surgery    Right mastectomy: IDC grade 2, 2.2 cm, with associated DCIS intermediate grade, separate focus high-grade DCIS, 0/4 lymph nodes negative, T2 N0 stage II a, ER 90%, PR 5%, HER-2 negative duration 1.42, Ki-67 60%    09/22/2015 - 01/05/2016 Chemotherapy    Adjuvant chemotherapy with TCH 5 cycles followed by Herceptin maintenance for 1 year    03/15/2016 -  Anti-estrogen oral therapy    Anastrozole then tamoxifen and then Letrozole daily (stopped due to side effects) 11/02/17 restarted on 12/03/17 on Letrozole     CHIEF COMPLIANT: Follow-up on letrozole therapy  INTERVAL HISTORY: Tracey Evans is a 73 y.o. with above-mentioned history of right breast cancer treated with mastectomy adjuvant  chemotherapy and is currently on letrozole therapy. She presents to the clinic alone today to discuss treatment options as she has had difficulty tolerating letrozole and has previously been unable to tolerate tamoxifen or anastrozole due to severe body aches and pains.  She reports she is not doing well, and has been very apathetic and not interested in doing much of anything lately. She is currently taking letrozole despite aches and pains. She reports neuropathy in her hands and feet but is still able to crochet. She had an ablation in her ribs for severe pain in her right side that radiates to her back. She reports she only wants to go to church and crochet, and is deterred from going out in public due to concern about her appearance, weight, and lymphedema in her right abdomen. Her fibromyalgia in her knees is worsening despite treatments from her orthopedist. She had been seeing a psychologist and psychiatrist and was interested in hypnosis to recover memories from childhood but is no longer seeing them. She does not exercise regularly but is considering water aerobics. She reports she is taking care of two sisters who are in nursing homes, which is stressful. She reviewed her medication list with me.  REVIEW OF SYSTEMS:   Constitutional: Denies fevers, chills or abnormal weight loss Eyes: Denies blurriness of vision Ears, nose, mouth, throat, and face: Denies mucositis or sore throat Respiratory: Denies cough, dyspnea or wheezes Cardiovascular: Denies palpitation, chest discomfort Gastrointestinal:  Denies nausea, heartburn or change in bowel habits Skin: Denies abnormal skin rashes MSK: (+) pain in right rib radiating to back (+) fibromyalgia pain in knees Lymphatics: (+) lymphedema in right abd Neurological: Denies new  weaknesses (+) neuropathy in hands and feet Behavioral/Psych: (+) depression  Extremities: No lower extremity edema Breast: denies any pain or lumps or nodules in either  breasts All other systems were reviewed with the patient and are negative.  I have reviewed the past medical history, past surgical history, social history and family history with the patient and they are unchanged from previous note.  ALLERGIES:  is allergic to contrast media [iodinated diagnostic agents]; dilaudid [hydromorphone hcl]; adhesive [tape]; codeine; and lactose intolerance (gi).  MEDICATIONS:  Current Outpatient Medications  Medication Sig Dispense Refill  . albuterol (PROAIR HFA) 108 (90 Base) MCG/ACT inhaler Inhale 2 puffs into the lungs every 6 (six) hours as needed for wheezing. 8.5 Inhaler 3  . ALPRAZolam (XANAX) 1 MG tablet Take 1 tablet (1 mg total) by mouth 2 (two) times daily as needed. 60 tablet 1  . amLODipine (NORVASC) 2.5 MG tablet TAKE 1 TABLET BY MOUTH EVERY DAY 90 tablet 1  . aspirin 81 MG tablet Take 81 mg by mouth daily.    Marland Kitchen atorvastatin (LIPITOR) 20 MG tablet TAKE 1 TABLET BY MOUTH EVERY DAY IN THE EVENING 90 tablet 1  . buPROPion (WELLBUTRIN XL) 300 MG 24 hr tablet TAKE 1 TABLET BY MOUTH EVERY DAY 90 tablet 1  . citalopram (CELEXA) 20 MG tablet Take 1 tablet (20 mg total) by mouth daily. 30 tablet 3  . diphenhydrAMINE (BENADRYL) 50 MG tablet Take one tablet (57ms total) one hour prior to study. 1 tablet 0  . letrozole (FEMARA) 2.5 MG tablet Take 1 tablet (2.5 mg total) by mouth daily. 90 tablet 3  . levothyroxine (SYNTHROID, LEVOTHROID) 75 MCG tablet TAKE 1 TABLET BY MOUTH EVERY DAY 90 tablet 0  . meloxicam (MOBIC) 15 MG tablet TAKE 1 TABLET BY MOUTH EVERY DAY 30 tablet 1  . methocarbamol (ROBAXIN) 500 MG tablet TAKE 1 TABLET BY MOUTH EVERY 6 HOURS AS NEEDED SPASMS  1  . nitroGLYCERIN (NITROSTAT) 0.4 MG SL tablet Place 1 tablet (0.4 mg total) under the tongue every 5 (five) minutes as needed. As needed for chest pain 30 tablet 3  . Polyethyl Glycol-Propyl Glycol (SYSTANE ULTRA) 0.4-0.3 % SOLN Place 1 drop into both eyes 3 (three) times daily as needed (for  dry eyes).     . ranitidine (ZANTAC) 150 MG tablet Take 150 mg by mouth 2 (two) times daily as needed for heartburn. Reported on 10/05/2015    . tiZANidine (ZANAFLEX) 4 MG tablet TAKE 1 TABLET BY MOUTH EVERY 8 HOURS AS NEEDED FOR SPASM  0   No current facility-administered medications for this visit.     PHYSICAL EXAMINATION: ECOG PERFORMANCE STATUS: 1 - Symptomatic but completely ambulatory  Vitals:   05/06/18 0943  BP: 106/84  Pulse: 83  Resp: 17  Temp: (!) 97.4 F (36.3 C)  SpO2: 97%   Filed Weights   05/06/18 0943  Weight: 209 lb 4.8 oz (94.9 kg)    GENERAL: alert, no distress and comfortable SKIN: skin color, texture, turgor are normal, no rashes or significant lesions EYES: normal, Conjunctiva are pink and non-injected, sclera clear OROPHARYNX: no exudate, no erythema and lips, buccal mucosa, and tongue normal  NECK: supple, thyroid normal size, non-tender, without nodularity LYMPH: no palpable lymphadenopathy in the cervical, axillary or inguinal LUNGS: clear to auscultation and percussion with normal breathing effort HEART: regular rate & rhythm and no murmurs and no lower extremity edema ABDOMEN: abdomen soft, non-tender and normal bowel sounds MUSCULOSKELETAL: no cyanosis of digits  and no clubbing  NEURO: alert & oriented x 3 with fluent speech, no focal motor/sensory deficits EXTREMITIES: No lower extremity edema  LABORATORY DATA:  I have reviewed the data as listed CMP Latest Ref Rng & Units 04/30/2018 07/08/2017 03/22/2017  Glucose 70 - 99 mg/dL 101(H) 113(H) 93  BUN 6 - 23 mg/dL '13 15 17  ' Creatinine 0.40 - 1.20 mg/dL 0.97 0.95 1.04  Sodium 135 - 145 mEq/L 141 143 142  Potassium 3.5 - 5.1 mEq/L 4.9 4.5 4.6  Chloride 96 - 112 mEq/L 104 107 104  CO2 19 - 32 mEq/L 30 29 34(H)  Calcium 8.4 - 10.5 mg/dL 10.0 9.7 9.0  Total Protein 6.0 - 8.3 g/dL 6.7 6.5 6.0  Total Bilirubin 0.2 - 1.2 mg/dL 0.3 0.4 0.6  Alkaline Phos 39 - 117 U/L 120(H) 88 87  AST 0 - 37 U/L '18  12 14  ' ALT 0 - 35 U/L '25 12 25    ' Lab Results  Component Value Date   WBC 7.6 04/30/2018   HGB 13.5 04/30/2018   HCT 41.0 04/30/2018   MCV 92.2 04/30/2018   PLT 263.0 04/30/2018   NEUTROABS 4.0 04/30/2018    ASSESSMENT & PLAN:  Breast cancer of lower-outer quadrant of right female breast ([place]) Right mastectomy 08/15/2015: IDC grade 2, 2.2 cm, with associated DCIS intermediate grade, separate focus high-grade DCIS, 0/4 lymph nodes negative, T2 N0 stage II a, ER 90%, PR 5%, HER-2 negative duration 1.42, Ki-67 60%  Treatment plan: 1. Adjuvant chemotherapy with [place] 2. Followed by adjuvant antiestrogen therapy with anastrozole 5 years No role of radiation since she had mastectomy. ----------------------------------------------------------------------------------------------------------------------- Priortreatment: Completed 5 cycles ofTCH, chemotherapy discontinued for neuropathy.Completed Herceptin maintenance 09/29/2006  Acute Diverticulitis: ED visit 02/22/16 Shingles:Resolved  Plan: 1.Anastrozole 1 mg daily startedin 03/15/16 discontinued 04/25/2016 because of pain in hands and feet,switched her to tamoxifen therapy. But she stopped taking tamoxifen as well. 3. Started letrozole 07/31/2016 stopped July 2019, resumed August 2019  CT abdomen and pelvis 07/30/2016: Normal study  Letrozole toxicities:Patient had profound aches and pains and hot flashes and myalgias for 1 month in July and she stopped it.  She felt much better after she stopped it.  She would like to resume it today.  Emotional distress/body dysmorphic disorder: Patient does not feel well and has not been participating in social activities because she does not like the look of her body.  She has gained abdominal girth and that has affected everything. I recommended intermittent fasting to help her lose weight. I discussed medication management for her symptoms and recommended that she see a  psychiatrist.    Breast cancer surveillance: 1.  Breast exam 05/06/2018: Benign left breast.  Right mastectomy. 2. mammogram 07/22/2017: No evidence of malignancy breast density category B  Return to clinic in6 months for follow-up to assess her weight issues.    No orders of the defined types were placed in this encounter.  The patient has a good understanding of the overall plan. she agrees with it. she will call with any problems that may develop before the next visit here.  Nicholas Lose, MD 05/06/2018  Julious Oka Dorshimer am acting as scribe for Dr. Nicholas Lose.  I have reviewed the above documentation for accuracy and completeness, and I agree with the above.

## 2018-05-06 ENCOUNTER — Telehealth: Payer: Self-pay | Admitting: Hematology and Oncology

## 2018-05-06 ENCOUNTER — Inpatient Hospital Stay: Payer: Medicare Other | Attending: Hematology and Oncology | Admitting: Hematology and Oncology

## 2018-05-06 DIAGNOSIS — Z7982 Long term (current) use of aspirin: Secondary | ICD-10-CM | POA: Insufficient documentation

## 2018-05-06 DIAGNOSIS — G629 Polyneuropathy, unspecified: Secondary | ICD-10-CM

## 2018-05-06 DIAGNOSIS — C50511 Malignant neoplasm of lower-outer quadrant of right female breast: Secondary | ICD-10-CM | POA: Insufficient documentation

## 2018-05-06 DIAGNOSIS — Z9011 Acquired absence of right breast and nipple: Secondary | ICD-10-CM

## 2018-05-06 DIAGNOSIS — Z9221 Personal history of antineoplastic chemotherapy: Secondary | ICD-10-CM | POA: Insufficient documentation

## 2018-05-06 DIAGNOSIS — Z170 Estrogen receptor positive status [ER+]: Secondary | ICD-10-CM | POA: Diagnosis not present

## 2018-05-06 DIAGNOSIS — Z79899 Other long term (current) drug therapy: Secondary | ICD-10-CM | POA: Diagnosis not present

## 2018-05-06 DIAGNOSIS — M797 Fibromyalgia: Secondary | ICD-10-CM | POA: Diagnosis not present

## 2018-05-06 DIAGNOSIS — Z79811 Long term (current) use of aromatase inhibitors: Secondary | ICD-10-CM | POA: Diagnosis not present

## 2018-05-06 NOTE — Telephone Encounter (Signed)
Gave avs and calendar ° °

## 2018-05-06 NOTE — Assessment & Plan Note (Signed)
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 Adjuvant chemotherapy with [place] 2. Followed by adjuvant antiestrogen therapy with anastrozole 5 years No role of radiation since she had mastectomy. ----------------------------------------------------------------------------------------------------------------------- Priortreatment: Completed 5 cycles ofTCH, chemotherapy discontinued for neuropathy.Completed Herceptin maintenance 09/29/2006  Acute Diverticulitis: ED visit 02/22/16 Shingles:Resolved  Plan: 1.Anastrozole 1 mg daily startedin 03/15/16 discontinued 04/25/2016 because of pain in hands and feet,switched her to tamoxifen therapy. But she stopped taking tamoxifen as well. 3. Started letrozole 07/31/2016 stopped July 2019, resumed August 2019  CT abdomen and pelvis 07/30/2016: Normal study  Letrozole toxicities:Patient had profound aches and pains and hot flashes and myalgias for 1 month in July and she stopped it.  She felt much better after she stopped it.  She would like to resume it today.  Breast cancer surveillance: 1.  Breast exam 05/06/2018: Benign left breast.  Right mastectomy. 2. mammogram 07/22/2017: No evidence of malignancy breast density category B  Return to clinic inone yearfor follow-up

## 2018-05-08 DIAGNOSIS — M170 Bilateral primary osteoarthritis of knee: Secondary | ICD-10-CM | POA: Diagnosis not present

## 2018-05-08 DIAGNOSIS — M25561 Pain in right knee: Secondary | ICD-10-CM | POA: Diagnosis not present

## 2018-05-08 DIAGNOSIS — M25562 Pain in left knee: Secondary | ICD-10-CM | POA: Diagnosis not present

## 2018-05-30 ENCOUNTER — Other Ambulatory Visit: Payer: Self-pay | Admitting: General Practice

## 2018-05-30 MED ORDER — CITALOPRAM HYDROBROMIDE 20 MG PO TABS: 20.0000 mg | ORAL_TABLET | Freq: Every day | ORAL | 1 refills | Status: DC

## 2018-06-03 ENCOUNTER — Ambulatory Visit: Payer: Medicare Other | Admitting: Hematology and Oncology

## 2018-06-05 ENCOUNTER — Other Ambulatory Visit: Payer: Self-pay | Admitting: Family Medicine

## 2018-06-05 DIAGNOSIS — M5136 Other intervertebral disc degeneration, lumbar region: Secondary | ICD-10-CM | POA: Diagnosis not present

## 2018-06-05 DIAGNOSIS — M150 Primary generalized (osteo)arthritis: Secondary | ICD-10-CM | POA: Diagnosis not present

## 2018-06-05 DIAGNOSIS — M79643 Pain in unspecified hand: Secondary | ICD-10-CM | POA: Diagnosis not present

## 2018-06-05 DIAGNOSIS — M797 Fibromyalgia: Secondary | ICD-10-CM | POA: Diagnosis not present

## 2018-06-05 DIAGNOSIS — M2550 Pain in unspecified joint: Secondary | ICD-10-CM | POA: Diagnosis not present

## 2018-06-08 ENCOUNTER — Emergency Department (HOSPITAL_COMMUNITY): Payer: Medicare Other

## 2018-06-08 ENCOUNTER — Observation Stay (HOSPITAL_COMMUNITY)
Admission: EM | Admit: 2018-06-08 | Discharge: 2018-06-09 | Disposition: A | Discharge status: Home / Self Care | Payer: Medicare Other | Attending: Internal Medicine | Admitting: Internal Medicine

## 2018-06-08 ENCOUNTER — Encounter (HOSPITAL_COMMUNITY): Payer: Self-pay

## 2018-06-08 ENCOUNTER — Other Ambulatory Visit: Payer: Self-pay

## 2018-06-08 DIAGNOSIS — I129 Hypertensive chronic kidney disease with stage 1 through stage 4 chronic kidney disease, or unspecified chronic kidney disease: Secondary | ICD-10-CM | POA: Diagnosis not present

## 2018-06-08 DIAGNOSIS — R079 Chest pain, unspecified: Secondary | ICD-10-CM | POA: Diagnosis not present

## 2018-06-08 DIAGNOSIS — R0781 Pleurodynia: Secondary | ICD-10-CM

## 2018-06-08 DIAGNOSIS — E1169 Type 2 diabetes mellitus with other specified complication: Secondary | ICD-10-CM | POA: Diagnosis not present

## 2018-06-08 DIAGNOSIS — Z79899 Other long term (current) drug therapy: Secondary | ICD-10-CM | POA: Insufficient documentation

## 2018-06-08 DIAGNOSIS — F32A Depression, unspecified: Secondary | ICD-10-CM | POA: Diagnosis present

## 2018-06-08 DIAGNOSIS — Z7982 Long term (current) use of aspirin: Secondary | ICD-10-CM | POA: Diagnosis not present

## 2018-06-08 DIAGNOSIS — R05 Cough: Secondary | ICD-10-CM | POA: Diagnosis not present

## 2018-06-08 DIAGNOSIS — I10 Essential (primary) hypertension: Secondary | ICD-10-CM | POA: Diagnosis not present

## 2018-06-08 DIAGNOSIS — E1122 Type 2 diabetes mellitus with diabetic chronic kidney disease: Secondary | ICD-10-CM | POA: Insufficient documentation

## 2018-06-08 DIAGNOSIS — N182 Chronic kidney disease, stage 2 (mild): Secondary | ICD-10-CM | POA: Diagnosis present

## 2018-06-08 DIAGNOSIS — C50511 Malignant neoplasm of lower-outer quadrant of right female breast: Secondary | ICD-10-CM | POA: Diagnosis not present

## 2018-06-08 DIAGNOSIS — F329 Major depressive disorder, single episode, unspecified: Secondary | ICD-10-CM | POA: Insufficient documentation

## 2018-06-08 DIAGNOSIS — E785 Hyperlipidemia, unspecified: Secondary | ICD-10-CM | POA: Diagnosis not present

## 2018-06-08 DIAGNOSIS — E119 Type 2 diabetes mellitus without complications: Secondary | ICD-10-CM

## 2018-06-08 DIAGNOSIS — J45909 Unspecified asthma, uncomplicated: Secondary | ICD-10-CM | POA: Insufficient documentation

## 2018-06-08 DIAGNOSIS — E039 Hypothyroidism, unspecified: Secondary | ICD-10-CM | POA: Diagnosis present

## 2018-06-08 LAB — COMPREHENSIVE METABOLIC PANEL
ALT: 18 U/L (ref 0–44)
AST: 18 U/L (ref 15–41)
Albumin: 3.6 g/dL (ref 3.5–5.0)
Alkaline Phosphatase: 117 U/L (ref 38–126)
Anion gap: 5 (ref 5–15)
BUN: 9 mg/dL (ref 8–23)
CO2: 28 mmol/L (ref 22–32)
Calcium: 9 mg/dL (ref 8.9–10.3)
Chloride: 107 mmol/L (ref 98–111)
Creatinine, Ser: 1.14 mg/dL — ABNORMAL HIGH (ref 0.44–1.00)
GFR calc Af Amer: 56 mL/min — ABNORMAL LOW (ref >60)
GFR calc non Af Amer: 48 mL/min — ABNORMAL LOW (ref >60)
GLUCOSE: 97 mg/dL (ref 70–99)
Potassium: 3.9 mmol/L (ref 3.5–5.1)
Sodium: 140 mmol/L (ref 135–145)
Total Bilirubin: 0.5 mg/dL (ref 0.3–1.2)
Total Protein: 6.4 g/dL — ABNORMAL LOW (ref 6.5–8.1)

## 2018-06-08 LAB — GLUCOSE, CAPILLARY
Glucose-Capillary: 104 mg/dL — ABNORMAL HIGH (ref 70–99)
Glucose-Capillary: 90 mg/dL (ref 70–99)

## 2018-06-08 LAB — TROPONIN I: Troponin I: <0.03 ng/mL (ref <0.03)

## 2018-06-08 LAB — I-STAT TROPONIN, ED: Troponin i, poc: 0 ng/mL (ref 0.00–0.08)

## 2018-06-08 LAB — CBC
HCT: 40.8 % (ref 36.0–46.0)
HEMOGLOBIN: 12.8 g/dL (ref 12.0–15.0)
MCH: 29.8 pg (ref 26.0–34.0)
MCHC: 31.4 g/dL (ref 30.0–36.0)
MCV: 94.9 fL (ref 80.0–100.0)
Platelets: 273 10*3/uL (ref 150–400)
RBC: 4.3 MIL/uL (ref 3.87–5.11)
RDW: 13.8 % (ref 11.5–15.5)
WBC: 6.7 10*3/uL (ref 4.0–10.5)
nRBC: 0 % (ref 0.0–0.2)

## 2018-06-08 LAB — LIPASE, BLOOD: Lipase: 31 U/L (ref 11–51)

## 2018-06-08 MED ORDER — METHOCARBAMOL 500 MG PO TABS
500.0000 mg | ORAL_TABLET | Freq: Four times a day (QID) | ORAL | Status: DC | PRN
  Administered 2018-06-08: 500 mg via ORAL
  Filled 2018-06-08: qty 1

## 2018-06-08 MED ORDER — ALPRAZOLAM 0.5 MG PO TABS
1.0000 mg | ORAL_TABLET | Freq: Two times a day (BID) | ORAL | Status: DC | PRN
  Administered 2018-06-09: 1 mg via ORAL
  Filled 2018-06-08: qty 2

## 2018-06-08 MED ORDER — BUPROPION HCL ER (XL) 150 MG PO TB24
300.0000 mg | ORAL_TABLET | Freq: Every day | ORAL | Status: DC
  Administered 2018-06-09: 300 mg via ORAL
  Filled 2018-06-08: qty 2

## 2018-06-08 MED ORDER — ENOXAPARIN SODIUM 40 MG/0.4ML ~~LOC~~ SOLN
40.0000 mg | SUBCUTANEOUS | Status: DC
  Administered 2018-06-08: 40 mg via SUBCUTANEOUS
  Filled 2018-06-08: qty 0.4

## 2018-06-08 MED ORDER — ASPIRIN 81 MG PO CHEW
81.0000 mg | CHEWABLE_TABLET | Freq: Every day | ORAL | Status: DC
  Administered 2018-06-09: 81 mg via ORAL
  Filled 2018-06-08: qty 1

## 2018-06-08 MED ORDER — AMLODIPINE BESYLATE 2.5 MG PO TABS
2.5000 mg | ORAL_TABLET | Freq: Every day | ORAL | Status: DC
  Administered 2018-06-09: 2.5 mg via ORAL
  Filled 2018-06-08: qty 1

## 2018-06-08 MED ORDER — MORPHINE SULFATE (PF) 2 MG/ML IV SOLN
2.0000 mg | INTRAVENOUS | Status: DC | PRN
  Administered 2018-06-08: 2 mg via INTRAVENOUS
  Filled 2018-06-08: qty 1

## 2018-06-08 MED ORDER — NITROGLYCERIN 0.4 MG SL SUBL
SUBLINGUAL_TABLET | SUBLINGUAL | Status: AC
  Filled 2018-06-08: qty 1

## 2018-06-08 MED ORDER — INSULIN ASPART 100 UNIT/ML ~~LOC~~ SOLN: 0.0000 [IU] | Freq: Three times a day (TID) | SUBCUTANEOUS | Status: DC

## 2018-06-08 MED ORDER — LEVOTHYROXINE SODIUM 75 MCG PO TABS
75.0000 ug | ORAL_TABLET | Freq: Every day | ORAL | Status: DC
  Administered 2018-06-09: 75 ug via ORAL
  Filled 2018-06-08: qty 3

## 2018-06-08 MED ORDER — ASPIRIN 81 MG PO CHEW: 324.0000 mg | CHEWABLE_TABLET | Freq: Once | ORAL | Status: DC

## 2018-06-08 MED ORDER — POLYVINYL ALCOHOL 1.4 % OP SOLN: 1.0000 [drp] | Freq: Three times a day (TID) | OPHTHALMIC | Status: DC | PRN

## 2018-06-08 MED ORDER — ATORVASTATIN CALCIUM 40 MG PO TABS
40.0000 mg | ORAL_TABLET | Freq: Every day | ORAL | Status: DC
  Administered 2018-06-08: 40 mg via ORAL
  Filled 2018-06-08: qty 1

## 2018-06-08 MED ORDER — ONDANSETRON HCL 4 MG/2ML IJ SOLN: 4.0000 mg | Freq: Four times a day (QID) | INTRAMUSCULAR | Status: DC | PRN

## 2018-06-08 MED ORDER — ACETAMINOPHEN 325 MG PO TABS
650.0000 mg | ORAL_TABLET | ORAL | Status: DC | PRN
  Administered 2018-06-08: 650 mg via ORAL
  Filled 2018-06-08: qty 2

## 2018-06-08 MED ORDER — CITALOPRAM HYDROBROMIDE 20 MG PO TABS
20.0000 mg | ORAL_TABLET | Freq: Every day | ORAL | Status: DC
  Administered 2018-06-09: 20 mg via ORAL
  Filled 2018-06-08: qty 1

## 2018-06-08 MED ORDER — ATORVASTATIN CALCIUM 10 MG PO TABS: 20.0000 mg | ORAL_TABLET | Freq: Every day | ORAL | Status: DC

## 2018-06-08 NOTE — ED Triage Notes (Signed)
Pt arrives by [facility], picked up from church c/o left sided chest pain today at 1115 with 5/10 pain. Pt states she had an episode of CP yesterday afternoon around 1500. Pt states she had cp when she was "singing." Given 324 aspirin and 3 tabs of Nitroglycerin SL by EMS en route to hospital and is now pain free. Pt states she has hx of angina and HTN. Pt a&o x4, VSS at this time.  EMS vitals BP 115/73 HR 83 98% O2 on RA

## 2018-06-08 NOTE — ED Notes (Signed)
Attempted report handoff x1; was asked to call back in 10 minutes because RN is on break.

## 2018-06-08 NOTE — ED Provider Notes (Signed)
[facility] EMERGENCY DEPARTMENT Provider Note   CSN: [id number] Arrival date & time: 06/08/18  1241    History   Chief Complaint Chief Complaint  Patient presents with  . Chest Pain    HPI Tracey Evans is a 73 y.o. female.  HPI: A 73 year old patient with a history of hypertension and hypercholesterolemia presents for evaluation of chest pain. Initial onset of pain was approximately 1-3 hours ago. The patient's chest pain is described as heaviness/pressure/tightness, is not worse with exertion and is relieved by nitroglycerin. The patient's chest pain is middle- or left-sided, is not well-localized, is not sharp and does not radiate to the arms/jaw/neck. The patient does not complain of nausea and denies diaphoresis. The patient has no history of stroke, has no history of peripheral artery disease, has not smoked in the past 90 days, denies any history of treated diabetes, has no relevant family history of coronary artery disease (first degree relative at less than age 41) and does not have an elevated BMI (>=30).   The history is provided by the patient.  Chest Pain  Pain location:  Substernal area Pain quality: aching and pressure   Pain radiates to:  L arm Pain severity:  Moderate Onset quality:  Sudden Duration:  2 days Timing:  Intermittent Progression:  Waxing and waning Chronicity:  Recurrent Context: at rest   Relieved by:  Nitroglycerin Worsened by:  Nothing Associated symptoms: no abdominal pain, no back pain, no cough, no dysphagia, no fever, no lower extremity edema, no nausea, no palpitations, no shortness of breath and no vomiting   Risk factors: high cholesterol and hypertension   Risk factors: no coronary artery disease, no prior DVT/PE and no smoking     Past Medical History:  Diagnosis Date  . Angina at rest [facility])    "occurs whenever it wants to, but worse during agitation"  . Anxiety   . Asthma   . Binge eating disorder   .  Breast cancer of lower-outer quadrant of left female breast ([place]) 07/08/2015   "NEVER HAD LEFT BREAST CANCER" (08/15/2015)  . Cancer of right breast ([place]) 08/15/2015  . Chronic bronchitis ([place])   . Chronic lower back pain   . Complication of anesthesia    had bronc spasms during intubation surgery 2009 on foot-need albuterol inhaler or neb tx preop  . Costochondritis   . Depression   . Diverticular disease   . Fibromyalgia   . GERD (gastroesophageal reflux disease)   . Hot flashes   . Hyperlipidemia   . Hypertension   . Hypothyroidism   . Osteoarthritis    "all over"  . Peptic ulcer disease   . Personal history of chemotherapy   . Shortness of breath dyspnea   . Type II diabetes mellitus ([place])    "diet controlled" (08/15/2015)    Patient Active Problem List   Diagnosis Date Noted  . Chest pain 06/08/2018  . CKD (chronic kidney disease) stage 2, GFR 60-89 ml/min 06/08/2018  . Cervical disc disease 05/02/2017  . Anemia associated with nutritional deficiency 01/13/2016  . Syncope 10/05/2015  . Genetic testing 08/21/2015  . Family history of breast cancer in female 07/15/2015  . Family history of colon cancer 07/15/2015  . Breast cancer of lower-outer quadrant of right female breast ([place]) 07/08/2015  . Disturbance in sleep behavior 11/18/2014  . Fibromyalgia 10/04/2014  . Diverticulitis of colon (without mention of hemorrhage)(562.11) 12/01/2013  . Dry mouth 04/29/2013  . Dry eye 04/29/2013  .  Dry skin 04/29/2013  . Obesity (BMI 30-39.9) 03/11/2013  . Edema 09/04/2012  . Depression 09/04/2012  . Chemotherapy-induced peripheral neuropathy ([place]) 05/12/2012  . OAB (overactive bladder) 04/01/2012  . DM w/o complication type II ([place]) 02/06/2012  . Colitis 01/21/2012  . Allergy to dogs 04/19/2011  . Polyarthralgia 09/18/2010  . STRESS INCONTINENCE 04/25/2010  . ASTHMA 04/11/2010  . VERTIGO 04/11/2010  . Hypothyroidism 01/19/2010  . Hyperlipidemia associated with type 2 diabetes  mellitus ([place]) 01/19/2010  . Essential hypertension 01/19/2010  . GERD 01/19/2010  . PEPTIC ULCER DISEASE 01/19/2010  . OSTEOARTHRITIS 01/19/2010  . LOW BACK PAIN 01/19/2010    Past Surgical History:  Procedure Laterality Date  . ABDOMINAL HYSTERECTOMY     "left my ovaries"  . ANKLE ARTHROSCOPY  01/02/2012   Procedure: ANKLE ARTHROSCOPY;  Surgeon: Colin Rhein, MD;  Location: [facility];  Service: Orthopedics;  Laterality: Right;  right ankle arthroscopy with extensive debridement , dridement and drilling talar dome osteochondral lesion  . ANKLE FRACTURE SURGERY Right 2009   Dr. Rolena Infante  . BREAST BIOPSY  07/2015  . CARDIAC CATHETERIZATION    . CARPAL TUNNEL RELEASE Bilateral   . COLONOSCOPY    . DILATION AND CURETTAGE OF UTERUS     "before hysterectomy"  . FRACTURE SURGERY    . KNEE ARTHROSCOPY Bilateral   . LAPAROSCOPIC CHOLECYSTECTOMY    . MASTECTOMY COMPLETE / SIMPLE W/ SENTINEL NODE BIOPSY Right 08/15/2015  . MASTECTOMY W/ SENTINEL NODE BIOPSY Right 08/15/2015   Procedure: TOTAL MASTECTOMY WITH SENTINEL LYMPH NODE BIOPSY AND BLUE DYE INJECTION ;  Surgeon: Fanny Skates, MD;  Location: [facility];  Service: General;  Laterality: Right;  . PORT-A-CATH REMOVAL N/A 11/12/2016   Procedure: REMOVAL PORT-A-CATH;  Surgeon: Jackolyn Confer, MD;  Location: [facility];  Service: General;  Laterality: N/A;  . PORTACATH PLACEMENT Left 08/15/2015  . PORTACATH PLACEMENT Left 08/15/2015   Procedure: INSERTION PORT-A-CATH ;  Surgeon: Fanny Skates, MD;  Location: [facility];  Service: General;  Laterality: Left;  . SHOULDER ARTHROSCOPY Bilateral 2009-2011   "bone spurs"  . TONSILLECTOMY       OB History    Gravida  2   Para  2   Term      Preterm      AB      Living  3     SAB      TAB      Ectopic      Multiple  1   Live Births               Home Medications    Prior to Admission medications   Medication Sig Start Date End Date Taking?  Authorizing Provider  albuterol (PROAIR HFA) 108 (90 Base) MCG/ACT inhaler Inhale 2 puffs into the lungs every 6 (six) hours as needed for wheezing. 12/18/17  Yes Midge Minium, MD  ALPRAZolam Duanne Moron) 1 MG tablet Take 1 tablet (1 mg total) by mouth 2 (two) times daily as needed. Patient taking differently: Take 1 mg by mouth 2 (two) times daily as needed for anxiety.  03/25/18  Yes Midge Minium, MD  amLODipine (NORVASC) 2.5 MG tablet TAKE 1 TABLET BY MOUTH EVERY DAY Patient taking differently: Take 2.5 mg by mouth daily.  03/21/18  Yes Midge Minium, MD  aspirin 81 MG tablet Take 81 mg by mouth daily.   Yes [provider]  atorvastatin (LIPITOR) 20 MG tablet TAKE 1 TABLET BY  MOUTH EVERY DAY IN THE EVENING Patient taking differently: Take 20 mg by mouth daily at 6 PM.  05/01/18  Yes Midge Minium, MD  buPROPion (WELLBUTRIN XL) 300 MG 24 hr tablet TAKE 1 TABLET BY MOUTH EVERY DAY Patient taking differently: Take 300 mg by mouth daily.  03/21/18  Yes Midge Minium, MD  citalopram (CELEXA) 20 MG tablet Take 1 tablet (20 mg total) by mouth daily. 05/30/18  Yes Midge Minium, MD  diphenhydrAMINE (BENADRYL) 50 MG tablet Take one tablet (50mg s total) one hour prior to study. 07/27/16  Yes Nicholas Lose, MD  Glycerin-Polysorbate 80 (REFRESH DRY EYE THERAPY OP) Place 1 drop into both eyes daily as needed (dry eyes).   Yes [provider]  letrozole (FEMARA) 2.5 MG tablet Take 1 tablet (2.5 mg total) by mouth daily. 12/03/17  Yes Nicholas Lose, MD  levothyroxine (SYNTHROID, LEVOTHROID) 75 MCG tablet TAKE 1 TABLET BY MOUTH EVERY DAY Patient taking differently: Take 75 mcg by mouth daily before breakfast.  06/05/18  Yes Midge Minium, MD  meloxicam (MOBIC) 15 MG tablet TAKE 1 TABLET BY MOUTH EVERY DAY Patient taking differently: Take 15 mg by mouth daily.  07/05/17  Yes Midge Minium, MD  methocarbamol (ROBAXIN) 500 MG tablet Take 500 mg by mouth  every 6 (six) hours as needed for muscle spasms.  06/21/17  Yes [provider]  nitroGLYCERIN (NITROSTAT) 0.4 MG SL tablet Place 1 tablet (0.4 mg total) under the tongue every 5 (five) minutes as needed. As needed for chest pain Patient taking differently: Place 0.4 mg under the tongue every 5 (five) minutes as needed for chest pain.  11/18/14  Yes Midge Minium, MD  Polyethyl Glycol-Propyl Glycol (SYSTANE ULTRA) 0.4-0.3 % SOLN Place 1 drop into the right eye 3 (three) times daily as needed (for dry eyes).    Yes [provider]  ranitidine (ZANTAC) 150 MG tablet Take 150 mg by mouth 2 (two) times daily as needed for heartburn. Reported on 10/05/2015   Yes [provider]  tiZANidine (ZANAFLEX) 4 MG tablet Take 4 mg by mouth every 8 (eight) hours as needed for muscle spasms.  04/05/16  Yes [provider]    Family History Family History  Problem Relation Age of Onset  . Dementia Mother   . Stroke Mother   . Heart Problems Mother   . Dementia Father   . Alcohol abuse Father   . Colon cancer Father        dx. 48 or younger  . Breast cancer Sister 69       inflammatory  . Diverticulitis Sister        maternal half-sister; severe - causing partial colectomy  . Breast cancer Sister        maternal half-sister; dx. early 75s  . Breast cancer Other 81       niece  . Breast cancer Other        niece dx. 45s  . Alcohol abuse Brother     Social History Social History   Tobacco Use  . Smoking status: Never Smoker  . Smokeless tobacco: Never Used  Substance Use Topics  . Alcohol use: Not Currently    Alcohol/week: 0.0 standard drinks    Comment: 08/15/2015 "1/2 glass of wine q 2 months"   . Drug use: No     Allergies   Contrast media [iodinated diagnostic agents]; Dilaudid [hydromorphone hcl]; Adhesive [tape]; Codeine; and Lactose intolerance (gi)  Review of Systems Review of Systems  Constitutional: Negative for chills and fever.  HENT:  Negative for ear pain, sore throat and trouble swallowing.   Eyes: Negative for pain and visual disturbance.  Respiratory: Negative for cough and shortness of breath.   Cardiovascular: Positive for chest pain. Negative for palpitations.  Gastrointestinal: Negative for abdominal pain, nausea and vomiting.  Genitourinary: Negative for dysuria and hematuria.  Musculoskeletal: Negative for arthralgias and back pain.  Skin: Negative for color change and rash.  Neurological: Negative for seizures and syncope.  All other systems reviewed and are negative.    Physical Exam Updated Vital Signs  ED Triage Vitals  Enc Vitals Group     BP 06/08/18 1251 112/74     Pulse Rate 06/08/18 1252 65     Resp 06/08/18 1251 15     Temp 06/08/18 1251 98.6 F (37 C)     Temp Source 06/08/18 1251 Oral     SpO2 06/08/18 1252 100 %     Weight 06/08/18 1257 204 lb (92.5 kg)     Height 06/08/18 1257 5\' 5"  (1.651 m)     Head Circumference --      Peak Flow --      Pain Score 06/08/18 1257 0     Pain Loc --      Pain Edu? --      Excl. in [place]? --     Physical Exam Vitals signs and nursing note reviewed.  Constitutional:      General: She is not in acute distress.    Appearance: She is well-developed. She is not ill-appearing.  HENT:     Head: Normocephalic and atraumatic.  Eyes:     Conjunctiva/sclera: Conjunctivae normal.     Pupils: Pupils are equal, round, and reactive to light.  Neck:     Musculoskeletal: Normal range of motion and neck supple.  Cardiovascular:     Rate and Rhythm: Normal rate and regular rhythm.     Pulses:          Radial pulses are 2+ on the right side and 2+ on the left side.       Dorsalis pedis pulses are 2+ on the right side and 2+ on the left side.     Heart sounds: Normal heart sounds. No murmur.  Pulmonary:     Effort: Pulmonary effort is normal. No respiratory distress.     Breath sounds: Normal breath sounds. No wheezing, rhonchi or rales.  Abdominal:      Palpations: Abdomen is soft.     Tenderness: There is no abdominal tenderness.  Musculoskeletal: Normal range of motion.     Right lower leg: No edema.     Left lower leg: No edema.  Skin:    General: Skin is warm and dry.     Capillary Refill: Capillary refill takes less than 2 seconds.  Neurological:     General: No focal deficit present.     Mental Status: She is alert.  Psychiatric:        Mood and Affect: Mood normal.      ED Treatments / Results  Labs (all labs ordered are listed, but only abnormal results are displayed) Labs Reviewed  COMPREHENSIVE METABOLIC PANEL - Abnormal; Notable for the following components:      Result Value   Creatinine, Ser 1.14 (*)    Total Protein 6.4 (*)    GFR calc non Af Amer 48 (*)    GFR calc Af  Amer 56 (*)    All other components within normal limits  CBC  LIPASE, BLOOD  TROPONIN I  TROPONIN I  I-STAT TROPONIN, ED    EKG EKG Interpretation  Date/Time:  Sunday June 08 2018 12:52:19 EST Ventricular Rate:  67 PR Interval:    QRS Duration: 88 QT Interval:  404 QTC Calculation: 427 R Axis:   27 Text Interpretation:  Sinus rhythm Abnormal R-wave progression, early transition Confirmed by Lennice Sites [phone]) on 06/08/2018 1:22:36 PM   Radiology Dg Chest Portable 1 View  Result Date: 06/08/2018 CLINICAL DATA:  left sided chest pain today at 1115 with 5/10 pain. Pt states she had an episode of CP yesterday afternoon around 1500. Pt states she had cp when she was "singing." Given 324 aspirin and 3 tabs of Nitroglycerin SL by EMS en route to hospital and is now pain free. Pt states she has hx of angina and HTN. Pt also reports a dry cough that she has experienced for a few months that gives her dizzy spells. EXAM: PORTABLE CHEST - 1 VIEW COMPARISON:  03/01/2016 and previous FINDINGS: Lungs are clear. Heart size and mediastinal contours are within normal limits. Tortuous thoracic aorta. No effusion.  No pneumothorax. Visualized bones  unremarkable. IMPRESSION: No acute cardiopulmonary disease. Electronically Signed   By: Lucrezia Europe M.D.   On: 06/08/2018 13:47    Procedures Procedures (including critical care time)  Medications Ordered in ED Medications  aspirin chewable tablet 324 mg (324 mg Oral Not Given 06/08/18 1325)  aspirin chewable tablet 81 mg (has no administration in time range)  amLODipine (NORVASC) tablet 2.5 mg (has no administration in time range)  ALPRAZolam (XANAX) tablet 1 mg (has no administration in time range)  buPROPion (WELLBUTRIN XL) 24 hr tablet 300 mg (has no administration in time range)  citalopram (CELEXA) tablet 20 mg (has no administration in time range)  levothyroxine (SYNTHROID, LEVOTHROID) tablet 75 mcg (has no administration in time range)  methocarbamol (ROBAXIN) tablet 500 mg (has no administration in time range)  polyvinyl alcohol (LIQUIFILM TEARS) 1.4 % ophthalmic solution 1 drop (has no administration in time range)  insulin aspart (novoLOG) injection 0-15 Units (has no administration in time range)  enoxaparin (LOVENOX) injection 40 mg (has no administration in time range)  morphine 2 MG/ML injection 2 mg (has no administration in time range)  acetaminophen (TYLENOL) tablet 650 mg (has no administration in time range)  ondansetron (ZOFRAN) injection 4 mg (has no administration in time range)  atorvastatin (LIPITOR) tablet 40 mg (has no administration in time range)     Initial Impression / Assessment and Plan / ED Course  I have reviewed the triage vital signs and the nursing notes.  LILLY GASSER is a 73 year old female with history of hypertension, high cholesterol, angina who presents to the ED with chest pain.  Patient with normal vitals.  No fever.  Patient with chest pain that has occurred over the last 2 days.  Patient had an episode of chest pain yesterday that improved with nitroglycerin.  She had another episode of chest pain today that radiated to her left shoulder  that improved with nitroglycerin and aspirin.  Patient had heart catheterization over a decade ago that was overall unremarkable.  She does not have any cardiac stents.  She does state that she has a history of angina but has not had any chest pain for about a decade.  Has not had to use nitroglycerin in a long time as  well.  Does not have any DVT or PE risk factors.  Wells criteria 0 and doubt PE.  Patient has no infectious symptoms.  Exam is overall unremarkable.  Patient with EKG that showed sinus rhythm.  No ischemic changes.  Troponin within normal limits.  No significant anemia, electrolyte abnormality, kidney injury, leukocytosis.  Patient with multiple cardiac risk factors.  Has elevated heart score.  Admitted to medicine service for further ACS rule out.  Hemodynamically stable throughout my care.  Chest x-ray showed no signs of pneumonia, pneumothorax, pleural effusion.  Did not have any further episodes of chest pain while under my care.  Hemodynamically stable throughout my care.  This chart was dictated using voice recognition software.  Despite best efforts to proofread,  errors can occur which can change the documentation meaning.   Final Clinical Impressions(s) / ED Diagnoses   Final diagnoses:  Chest pain, unspecified type    ED Discharge Orders    None       Lennice Sites, DO 06/08/18 1527

## 2018-06-08 NOTE — ED Notes (Signed)
Xray tech at bedside.

## 2018-06-08 NOTE — Plan of Care (Signed)

## 2018-06-08 NOTE — ED Notes (Signed)
Patient's [employer] member contact info:  Tracey Evans [phone]

## 2018-06-08 NOTE — ED Notes (Signed)
ED TO INPATIENT HANDOFF REPORT  ED Nurse Name and Phone #:  Percell Locus [phone]   S Name/Age/Gender Tracey Evans 73 y.o. female Room/Bed: H013C/H013C  Code Status   Code Status: Prior  Home/SNF/Other Home Patient oriented to: self, place, time and situation Is this baseline? Yes   Triage Complete: Triage complete  Chief Complaint chest pain  Triage Note Pt arrives by [facility], picked up from church c/o left sided chest pain today at 1115 with 5/10 pain. Pt states she had an episode of CP yesterday afternoon around 1500. Pt states she had cp when she was "singing." Given 324 aspirin and 3 tabs of Nitroglycerin SL by EMS en route to hospital and is now pain free. Pt states she has hx of angina and HTN. Pt a&o x4, VSS at this time.  EMS vitals BP 115/73 HR 83 98% O2 on RA     Allergies Allergies  Allergen Reactions  . Contrast Media [Iodinated Diagnostic Agents] Shortness Of Breath    Patient has SOB, tightness in chest and feels like an elephant is sitting on chest, patient should be  scanned at hospital Per Dr. Alvester Chou  . Dilaudid [Hydromorphone Hcl] Anaphylaxis    Pt stopped breathing  . Adhesive [Tape]     blister  . Codeine Nausea Only    insomnia  . Lactose Intolerance (Gi) Diarrhea    Level of Care/Admitting Diagnosis ED Disposition    ED Disposition Condition [facility] Area: [place] [[id number]]  Level of Care: Cardiac Telemetry [103]  I expect the patient will be discharged within 24 hours: Yes  LOW acuity---Tx typically complete <24 hrs---ACUTE conditions typically can be evaluated <24 hours---LABS likely to return to acceptable levels <24 hours---IS near functional baseline---EXPECTED to return to current living arrangement---NOT newly hypoxic: Meets criteria for 5C-Observation unit  Diagnosis: Chest pain [662947]  Admitting Physician: Karmen Bongo [[id number]]  Attending Physician: Karmen Bongo [[id number]]  PT Class (Do  Not Modify): Observation [104]  PT Acc Code (Do Not Modify): Observation [10022]       B Medical/Surgery History Past Medical History:  Diagnosis Date  . Angina at rest [facility])    "occurs whenever it wants to, but worse during agitation"  . Anxiety   . Asthma   . Binge eating disorder   . Breast cancer of lower-outer quadrant of left female breast ([place]) 07/08/2015   "NEVER HAD LEFT BREAST CANCER" (08/15/2015)  . Cancer of right breast ([place]) 08/15/2015  . Chronic bronchitis ([place])   . Chronic lower back pain   . Complication of anesthesia    had bronc spasms during intubation surgery 2009 on foot-need albuterol inhaler or neb tx preop  . Costochondritis   . Depression   . Diverticular disease   . Fibromyalgia   . GERD (gastroesophageal reflux disease)   . Hot flashes   . Hyperlipidemia   . Hypertension   . Hypothyroidism   . Osteoarthritis    "all over"  . Peptic ulcer disease   . Personal history of chemotherapy   . Shortness of breath dyspnea   . Type II diabetes mellitus ([place])    "diet controlled" (08/15/2015)   Past Surgical History:  Procedure Laterality Date  . ABDOMINAL HYSTERECTOMY     "left my ovaries"  . ANKLE ARTHROSCOPY  01/02/2012   Procedure: ANKLE ARTHROSCOPY;  Surgeon: Colin Rhein, MD;  Location: [place];  Service: Orthopedics;  Laterality: Right;  right ankle  arthroscopy with extensive debridement , dridement and drilling talar dome osteochondral lesion  . ANKLE FRACTURE SURGERY Right 2009   Dr. Rolena Infante  . BREAST BIOPSY  07/2015  . CARDIAC CATHETERIZATION    . CARPAL TUNNEL RELEASE Bilateral   . COLONOSCOPY    . DILATION AND CURETTAGE OF UTERUS     "before hysterectomy"  . FRACTURE SURGERY    . KNEE ARTHROSCOPY Bilateral   . LAPAROSCOPIC CHOLECYSTECTOMY    . MASTECTOMY COMPLETE / SIMPLE W/ SENTINEL NODE BIOPSY Right 08/15/2015  . MASTECTOMY W/ SENTINEL NODE BIOPSY Right 08/15/2015   Procedure: TOTAL MASTECTOMY WITH SENTINEL LYMPH NODE  BIOPSY AND BLUE DYE INJECTION ;  Surgeon: Fanny Skates, MD;  Location: [place];  Service: General;  Laterality: Right;  . PORT-A-CATH REMOVAL N/A 11/12/2016   Procedure: REMOVAL PORT-A-CATH;  Surgeon: Jackolyn Confer, MD;  Location: [facility];  Service: General;  Laterality: N/A;  . PORTACATH PLACEMENT Left 08/15/2015  . PORTACATH PLACEMENT Left 08/15/2015   Procedure: INSERTION PORT-A-CATH ;  Surgeon: Fanny Skates, MD;  Location: [place];  Service: General;  Laterality: Left;  . SHOULDER ARTHROSCOPY Bilateral 2009-2011   "bone spurs"  . TONSILLECTOMY       A IV Location/Drains/Wounds Patient Lines/Drains/Airways Status   Active Line/Drains/Airways    Name:   Placement date:   Placement time:   Site:   Days:   Implanted Port 08/15/15   08/15/15    -    -   1028   Closed System Drain 1 Right Breast Bulb (JP) 19 Fr.   08/15/15    1543    Breast   1028   Closed System Drain 2 Right Breast Bulb (JP) 19 Fr.   08/15/15    1543    Breast   1028   Incision (Closed) 08/15/15 Chest Left   08/15/15    1444     1028   Incision (Closed) 08/15/15 Breast Right   08/15/15    1551     1028   Incision (Closed) 11/12/16 Chest Other (Comment)   11/12/16    0857     573          Intake/Output Last 24 hours No intake or output data in the 24 hours ending 06/08/18 1434  Labs/Imaging Results for orders placed or performed during the hospital encounter of 06/08/18 (from the past 48 hour(s))  CBC     Status: None   Collection Time: 06/08/18 12:50 PM  Result Value Ref Range   WBC 6.7 4.0 - 10.5 K/uL   RBC 4.30 3.87 - 5.11 MIL/uL   Hemoglobin 12.8 12.0 - 15.0 g/dL   HCT 40.8 36.0 - 46.0 %   MCV 94.9 80.0 - 100.0 fL   MCH 29.8 26.0 - 34.0 pg   MCHC 31.4 30.0 - 36.0 g/dL   RDW 13.8 11.5 - 15.5 %   Platelets 273 150 - 400 K/uL   nRBC 0.0 0.0 - 0.2 %    Comment: Performed at [facility], [place] [address], [place], [place] [address]  Comprehensive metabolic panel     Status: Abnormal    Collection Time: 06/08/18 12:50 PM  Result Value Ref Range   Sodium 140 135 - 145 mmol/L   Potassium 3.9 3.5 - 5.1 mmol/L   Chloride 107 98 - 111 mmol/L   CO2 28 22 - 32 mmol/L   Glucose, Bld 97 70 - 99 mg/dL   BUN 9 8 - 23 mg/dL  Creatinine, Ser 1.14 (H) 0.44 - 1.00 mg/dL   Calcium 9.0 8.9 - 10.3 mg/dL   Total Protein 6.4 (L) 6.5 - 8.1 g/dL   Albumin 3.6 3.5 - 5.0 g/dL   AST 18 15 - 41 U/L   ALT 18 0 - 44 U/L   Alkaline Phosphatase 117 38 - 126 U/L   Total Bilirubin 0.5 0.3 - 1.2 mg/dL   GFR calc non Af Amer 48 (L) >60 mL/min   GFR calc Af Amer 56 (L) >60 mL/min   Anion gap 5 5 - 15    Comment: Performed at [facility] [address], [place], [place] [address]  Lipase, blood     Status: None   Collection Time: 06/08/18 12:50 PM  Result Value Ref Range   Lipase 31 11 - 51 U/L    Comment: Performed at [facility], [place] [address], [place], [place] [address]  I-stat troponin, ED (0, 3)     Status: None   Collection Time: 06/08/18  1:06 PM  Result Value Ref Range   Troponin i, poc 0.00 0.00 - 0.08 ng/mL   Comment 3            Comment: Due to the release kinetics of cTnI, a negative result within the first hours of the onset of symptoms does not rule out myocardial infarction with certainty. If myocardial infarction is still suspected, repeat the test at appropriate intervals.    Dg Chest Portable 1 View  Result Date: 06/08/2018 CLINICAL DATA:  left sided chest pain today at 1115 with 5/10 pain. Pt states she had an episode of CP yesterday afternoon around 1500. Pt states she had cp when she was "singing." Given 324 aspirin and 3 tabs of Nitroglycerin SL by EMS en route to hospital and is now pain free. Pt states she has hx of angina and HTN. Pt also reports a dry cough that she has experienced for a few months that gives her dizzy spells. EXAM: PORTABLE CHEST - 1 VIEW COMPARISON:  03/01/2016 and previous FINDINGS: Lungs are clear. Heart size and mediastinal  contours are within normal limits. Tortuous thoracic aorta. No effusion.  No pneumothorax. Visualized bones unremarkable. IMPRESSION: No acute cardiopulmonary disease. Electronically Signed   By: Lucrezia Europe M.D.   On: 06/08/2018 13:47    Pending Labs Unresulted Labs (From admission, onward)   None      Vitals/Pain Today's Vitals   06/08/18 1312 06/08/18 1315 06/08/18 1330 06/08/18 1351  BP:   121/90   Pulse:  76    Resp:  18 19   Temp:      TempSrc:      SpO2:  99%    Weight:      Height:      PainSc: 0-No pain   0-No pain    Isolation Precautions No active isolations  Medications Medications  aspirin chewable tablet 324 mg (324 mg Oral Not Given 06/08/18 1325)    Mobility walks High fall risk   Focused Assessments Cardiac Assessment Handoff:  Cardiac Rhythm: Normal sinus rhythm Lab Results  Component Value Date   TROPONINI <0.03 09/27/2015   Lab Results  Component Value Date   DDIMER 0.31 12/31/2013   Does the Patient currently have chest pain? No     R Recommendations: See Admitting Provider Note  Report given to:   Additional Notes:

## 2018-06-08 NOTE — ED Notes (Signed)
ED Provider at bedside. 

## 2018-06-08 NOTE — H&P (Signed)
History and Physical    THERESIA PREE UXN:[id number] DOB: 02-19-1946 DOA: 06/08/2018  PCP: Midge Minium, MD Consultants:  Lindi Adie - oncology; Catheryn Bacon - pain management; Ramos - orthopedics; [facility] - cardiology Patient coming from:  Home - lives alone; NOK: Joelene Millin, [phone]  Chief Complaint: Chest pain  HPI: Tracey Evans is a 73 y.o. female with medical history significant of DM; hypothyroidism; HTN; HLD; fibromyalgia; and breast cancer presenting with chest pain.  Patient started having some weird chest pain yesterday while at choir rehearsal.  Normally her angina goes down her arm.  Yesterday, the pain was fixed in her left upper chest and she felt flutters.  Today, she had the same issue in the same place and today she felt like she might pass out.  She has been coughing a lot, uncertain why - when this happens, she gets very dizzy like she might pass out.  The pain resolved both times with NTG - 2 yesterday and 3 today.  The pain resolved faster yesterday than today.  Both episodes were exertional.  It has been more than 10 years since last stress or cath.  She is pain-free at this time.   ED Course:  Chest pain yesterday and today.  Likely needs ACS rule-out.  Has had remote heart caths, has "different anatomy in her heart... has some angina from that."  CP resolved with NTG both days.  Troponin negative, EKG ok.  Review of Systems: As per HPI; otherwise review of systems reviewed and negative.   Ambulatory Status:  Ambulates with cane or walker  Past Medical History:  Diagnosis Date  . Angina at rest [place])    "occurs whenever it wants to, but worse during agitation"  . Anxiety   . Asthma   . Binge eating disorder   . Breast cancer of lower-outer quadrant of left female breast ([place]) 07/08/2015   "NEVER HAD LEFT BREAST CANCER" (08/15/2015)  . Cancer of right breast ([place]) 08/15/2015  . Chronic bronchitis ([place])   . Chronic lower back pain   . Complication of  anesthesia    had bronc spasms during intubation surgery 2009 on foot-need albuterol inhaler or neb tx preop  . Costochondritis   . Depression   . Diverticular disease   . Fibromyalgia   . GERD (gastroesophageal reflux disease)   . Hot flashes   . Hyperlipidemia   . Hypertension   . Hypothyroidism   . Osteoarthritis    "all over"  . Peptic ulcer disease   . Personal history of chemotherapy   . Shortness of breath dyspnea   . Type II diabetes mellitus ([place])    "diet controlled" (08/15/2015)    Past Surgical History:  Procedure Laterality Date  . ABDOMINAL HYSTERECTOMY     "left my ovaries"  . ANKLE ARTHROSCOPY  01/02/2012   Procedure: ANKLE ARTHROSCOPY;  Surgeon: Colin Rhein, MD;  Location: [place];  Service: Orthopedics;  Laterality: Right;  right ankle arthroscopy with extensive debridement , dridement and drilling talar dome osteochondral lesion  . ANKLE FRACTURE SURGERY Right 2009   Dr. Rolena Infante  . BREAST BIOPSY  07/2015  . CARDIAC CATHETERIZATION    . CARPAL TUNNEL RELEASE Bilateral   . COLONOSCOPY    . DILATION AND CURETTAGE OF UTERUS     "before hysterectomy"  . FRACTURE SURGERY    . KNEE ARTHROSCOPY Bilateral   . LAPAROSCOPIC CHOLECYSTECTOMY    . MASTECTOMY COMPLETE / SIMPLE W/ SENTINEL NODE BIOPSY Right  08/15/2015  . MASTECTOMY W/ SENTINEL NODE BIOPSY Right 08/15/2015   Procedure: TOTAL MASTECTOMY WITH SENTINEL LYMPH NODE BIOPSY AND BLUE DYE INJECTION ;  Surgeon: Fanny Skates, MD;  Location: [place];  Service: General;  Laterality: Right;  . PORT-A-CATH REMOVAL N/A 11/12/2016   Procedure: REMOVAL PORT-A-CATH;  Surgeon: Jackolyn Confer, MD;  Location: [facility];  Service: General;  Laterality: N/A;  . PORTACATH PLACEMENT Left 08/15/2015  . PORTACATH PLACEMENT Left 08/15/2015   Procedure: INSERTION PORT-A-CATH ;  Surgeon: Fanny Skates, MD;  Location: [place];  Service: General;  Laterality: Left;  . SHOULDER ARTHROSCOPY Bilateral 2009-2011    "bone spurs"  . TONSILLECTOMY      Social History   Socioeconomic History  . Marital status: Single    Spouse name: Not on file  . Number of children: Not on file  . Years of education: Not on file  . Highest education level: Not on file  Occupational History  . Occupation: retired  Scientific laboratory technician  . Financial resource strain: Not on file  . Food insecurity:    Worry: Not on file    Inability: Not on file  . Transportation needs:    Medical: Not on file    Non-medical: Not on file  Tobacco Use  . Smoking status: Never Smoker  . Smokeless tobacco: Never Used  Substance and Sexual Activity  . Alcohol use: Not Currently    Alcohol/week: 0.0 standard drinks    Comment: 08/15/2015 "1/2 glass of wine q 2 months"   . Drug use: No  . Sexual activity: Not Currently    Comment: 1st intercourse- 14, partners- 4, divorced  Lifestyle  . Physical activity:    Days per week: Not on file    Minutes per session: Not on file  . Stress: Not on file  Relationships  . Social connections:    Talks on phone: Not on file    Gets together: Not on file    Attends religious service: Not on file    Active member of club or organization: Not on file    Attends meetings of clubs or organizations: Not on file    Relationship status: Not on file  . Intimate partner violence:    Fear of current or ex partner: Not on file    Emotionally abused: Not on file    Physically abused: Not on file    Forced sexual activity: Not on file  Other Topics Concern  . Not on file  Social History Narrative  . Not on file    Allergies  Allergen Reactions  . Contrast Media [Iodinated Diagnostic Agents] Shortness Of Breath    Patient has SOB, tightness in chest and feels like an elephant is sitting on chest, patient should be  scanned at hospital Per Dr. Alvester Chou  . Dilaudid [Hydromorphone Hcl] Anaphylaxis    Pt stopped breathing  . Adhesive [Tape]     blister  . Codeine Nausea Only    insomnia  . Lactose  Intolerance (Gi) Diarrhea    Family History  Problem Relation Age of Onset  . Dementia Mother   . Stroke Mother   . Heart Problems Mother   . Dementia Father   . Alcohol abuse Father   . Colon cancer Father        dx. 30 or younger  . Breast cancer Sister 67       inflammatory  . Diverticulitis Sister        maternal half-sister;  severe - causing partial colectomy  . Breast cancer Sister        maternal half-sister; dx. early 5s  . Breast cancer Other 69       niece  . Breast cancer Other        niece dx. 20s  . Alcohol abuse Brother     Prior to Admission medications   Medication Sig Start Date End Date Taking? Authorizing Provider  albuterol (PROAIR HFA) 108 (90 Base) MCG/ACT inhaler Inhale 2 puffs into the lungs every 6 (six) hours as needed for wheezing. 12/18/17   Midge Minium, MD  ALPRAZolam Duanne Moron) 1 MG tablet Take 1 tablet (1 mg total) by mouth 2 (two) times daily as needed. 03/25/18   Midge Minium, MD  amLODipine (NORVASC) 2.5 MG tablet TAKE 1 TABLET BY MOUTH EVERY DAY 03/21/18   Midge Minium, MD  aspirin 81 MG tablet Take 81 mg by mouth daily.    [provider]  atorvastatin (LIPITOR) 20 MG tablet TAKE 1 TABLET BY MOUTH EVERY DAY IN THE EVENING 05/01/18   Midge Minium, MD  buPROPion (WELLBUTRIN XL) 300 MG 24 hr tablet TAKE 1 TABLET BY MOUTH EVERY DAY 03/21/18   Midge Minium, MD  citalopram (CELEXA) 20 MG tablet Take 1 tablet (20 mg total) by mouth daily. 05/30/18   Midge Minium, MD  diphenhydrAMINE (BENADRYL) 50 MG tablet Take one tablet (50mg s total) one hour prior to study. 07/27/16   Nicholas Lose, MD  letrozole (FEMARA) 2.5 MG tablet Take 1 tablet (2.5 mg total) by mouth daily. 12/03/17   Nicholas Lose, MD  levothyroxine (SYNTHROID, LEVOTHROID) 75 MCG tablet TAKE 1 TABLET BY MOUTH EVERY DAY 06/05/18   Midge Minium, MD  meloxicam (MOBIC) 15 MG tablet TAKE 1 TABLET BY MOUTH EVERY DAY 07/05/17   Midge Minium,  MD  methocarbamol (ROBAXIN) 500 MG tablet TAKE 1 TABLET BY MOUTH EVERY 6 HOURS AS NEEDED SPASMS 06/21/17   [provider]  nitroGLYCERIN (NITROSTAT) 0.4 MG SL tablet Place 1 tablet (0.4 mg total) under the tongue every 5 (five) minutes as needed. As needed for chest pain 11/18/14   Midge Minium, MD  Polyethyl Glycol-Propyl Glycol (SYSTANE ULTRA) 0.4-0.3 % SOLN Place 1 drop into both eyes 3 (three) times daily as needed (for dry eyes).     [provider]  ranitidine (ZANTAC) 150 MG tablet Take 150 mg by mouth 2 (two) times daily as needed for heartburn. Reported on 10/05/2015    [provider]  tiZANidine (ZANAFLEX) 4 MG tablet TAKE 1 TABLET BY MOUTH EVERY 8 HOURS AS NEEDED FOR SPASM 04/05/16   [provider]    Physical Exam: Vitals:   06/08/18 1252 06/08/18 1257 06/08/18 1315 06/08/18 1330  BP:    121/90  Pulse: 65  76   Resp: 12  18 19   Temp:      TempSrc:      SpO2: 100%  99%   Weight:  92.5 kg    Height:  5\' 5"  (1.651 m)       . General:  Appears calm and comfortable and is NAD . Eyes: EOMI, normal lids, iris . ENT:  grossly normal hearing, lips & tongue, mmm . Neck:  no LAD, masses or thyromegaly . Cardiovascular:  RRR, no m/r/g. No LE edema. She has an area of scarring and muscle atrophy in the left chest wall near where the chest pain is reported. Marland Kitchen Respiratory:  CTA bilaterally with no wheezes/rales/rhonchi.  Normal respiratory effort. . Abdomen:  soft, NT, ND, NABS . Skin:  no rash or induration seen on limited exam . Musculoskeletal:  grossly normal tone BUE/BLE, good ROM, no bony abnormality; scar on right lateral ankle with TTP (chronic) . Psychiatric:  grossly normal mood and affect, speech fluent and appropriate, AOx3 . Neurologic:  CN 2-12 grossly intact, moves all extremities in coordinated fashion, sensation intact    Radiological Exams on Admission: Dg Chest Portable 1 View  Result Date: 06/08/2018 CLINICAL DATA:   left sided chest pain today at 1115 with 5/10 pain. Pt states she had an episode of CP yesterday afternoon around 1500. Pt states she had cp when she was "singing." Given 324 aspirin and 3 tabs of Nitroglycerin SL by EMS en route to hospital and is now pain free. Pt states she has hx of angina and HTN. Pt also reports a dry cough that she has experienced for a few months that gives her dizzy spells. EXAM: PORTABLE CHEST - 1 VIEW COMPARISON:  03/01/2016 and previous FINDINGS: Lungs are clear. Heart size and mediastinal contours are within normal limits. Tortuous thoracic aorta. No effusion.  No pneumothorax. Visualized bones unremarkable. IMPRESSION: No acute cardiopulmonary disease. Electronically Signed   By: Lucrezia Europe M.D.   On: 06/08/2018 13:47    EKG: Independently reviewed.  NSR with rate 67; nonspecific ST changes with no evidence of acute ischemia   Labs on Admission: I have personally reviewed the available labs and imaging studies at the time of the admission.  Pertinent labs:   BUN 9/Creatinine 1.14/GFR 56 - appears to be at/near baseline Troponin 0.00 Normal CBC Lipids 04/30/18: 166/46/102/89 Normal TSH 04/30/18  Assessment/Plan Principal Problem:   Chest pain Active Problems:   Hypothyroidism   Hyperlipidemia associated with type 2 diabetes mellitus ([place])   Essential hypertension   DM w/o complication type II (HCC)   Depression   Breast cancer of lower-outer quadrant of right female breast ([place])   CKD (chronic kidney disease) stage 2, GFR 60-89 ml/min   Chest pain -Patient with left-sided chest pressure that has come on with exertion for 2 days and resolved with escalating doses of NTG. -2/3 typical symptoms suggestive of atypical cardiac chest pain.  -CXR unremarkable.   -Initial cardiac troponin negative.  -EKG not indicative of acute ischemia.   -HEART pathway score is 6, indicating that the patient has an elevated risk score and requires further evaluation. -Will  plan to place in observation status on telemetry to rule out ACS by overnight observation.  -cycle troponin q6h x 3 and repeat EKG in AM -Continue ASA 81 mg daily -morphine given -Cardiology consultation in AM - patient sees Dr. Terrence Dupont and he will need to be consulted -NPO for possible stress test  -Will plan to start Heparin drip if enzymes are positive and/or chest pain recurs -Of note, patient takes Mobic and Femara at home; both have been held  HTN -Continue Norvasc  HLD -Continue Lipitor, increase to 40 mg daily based on recent LDL >100  DM -Recent A1c was 6.5 -She does not appear to be on medications for DM at this time -Will cover with moderate-scale SSI for now  H/o breast cancer -s/p R mastectomy, chemotherapy, and anti-estrogen therapy -Note the Letrozole is currently being held, as above -In remission as of last appointment in 1/20  Stage 2-3 CKD -Appears to be at/near baseline at this time -Recheck BMP in AM  Depression -Continue  home medications - Wellbutrin, Celexa, and Xanax prn  Hypothyroidism -Recent normal TSH -Continue Synthroid at current dose for now    DVT prophylaxis: Lovenox  Code Status:  DNR - confirmed with patient Family Communication: None present Disposition Plan:  Home once clinically improved Consults called: Will need cardiology tomorrow AM Admission status: It is my clinical opinion that referral for OBSERVATION is reasonable and necessary in this patient based on the above information provided. The aforementioned taken together are felt to place the patient at high risk for further clinical deterioration. However it is anticipated that the patient may be medically stable for discharge from the hospital within 24 to 48 hours.      Karmen Bongo MD [facility]   How to contact the [facility] Attending or Consulting provider [place] or covering provider during after hours [place], for this patient?  1. Check the care team in [facility]  and look for a) attending/consulting [facility] provider listed and b) the [facility] team listed 2. Log into [email or web address] and use [place]'s universal password to access. If you do not have the password, please contact the hospital operator. 3. Locate the [facility] provider you are looking for under [facility] and page to a number that you can be directly reached. 4. If you still have difficulty reaching the provider, please page the [facility] (Director on Call) for the Hospitalists listed on amion for assistance.   06/08/2018, 2:55 PM

## 2018-06-09 ENCOUNTER — Observation Stay (HOSPITAL_COMMUNITY): Payer: Medicare Other

## 2018-06-09 ENCOUNTER — Other Ambulatory Visit: Payer: Self-pay | Admitting: Family Medicine

## 2018-06-09 ENCOUNTER — Ambulatory Visit: Payer: Medicare Other | Admitting: Family Medicine

## 2018-06-09 DIAGNOSIS — R079 Chest pain, unspecified: Secondary | ICD-10-CM

## 2018-06-09 DIAGNOSIS — I10 Essential (primary) hypertension: Secondary | ICD-10-CM | POA: Diagnosis not present

## 2018-06-09 DIAGNOSIS — E785 Hyperlipidemia, unspecified: Secondary | ICD-10-CM

## 2018-06-09 DIAGNOSIS — E039 Hypothyroidism, unspecified: Secondary | ICD-10-CM | POA: Diagnosis not present

## 2018-06-09 DIAGNOSIS — E119 Type 2 diabetes mellitus without complications: Secondary | ICD-10-CM

## 2018-06-09 DIAGNOSIS — I129 Hypertensive chronic kidney disease with stage 1 through stage 4 chronic kidney disease, or unspecified chronic kidney disease: Secondary | ICD-10-CM | POA: Diagnosis not present

## 2018-06-09 DIAGNOSIS — N182 Chronic kidney disease, stage 2 (mild): Secondary | ICD-10-CM

## 2018-06-09 DIAGNOSIS — Z79899 Other long term (current) drug therapy: Secondary | ICD-10-CM | POA: Diagnosis not present

## 2018-06-09 DIAGNOSIS — E1169 Type 2 diabetes mellitus with other specified complication: Secondary | ICD-10-CM | POA: Diagnosis not present

## 2018-06-09 DIAGNOSIS — C50511 Malignant neoplasm of lower-outer quadrant of right female breast: Secondary | ICD-10-CM | POA: Diagnosis not present

## 2018-06-09 DIAGNOSIS — Z7982 Long term (current) use of aspirin: Secondary | ICD-10-CM | POA: Diagnosis not present

## 2018-06-09 DIAGNOSIS — F329 Major depressive disorder, single episode, unspecified: Secondary | ICD-10-CM | POA: Diagnosis not present

## 2018-06-09 DIAGNOSIS — I25118 Atherosclerotic heart disease of native coronary artery with other forms of angina pectoris: Secondary | ICD-10-CM | POA: Diagnosis not present

## 2018-06-09 DIAGNOSIS — J45909 Unspecified asthma, uncomplicated: Secondary | ICD-10-CM | POA: Diagnosis not present

## 2018-06-09 DIAGNOSIS — E1122 Type 2 diabetes mellitus with diabetic chronic kidney disease: Secondary | ICD-10-CM | POA: Diagnosis not present

## 2018-06-09 LAB — GLUCOSE, CAPILLARY
Glucose-Capillary: 101 mg/dL — ABNORMAL HIGH (ref 70–99)
Glucose-Capillary: 101 mg/dL — ABNORMAL HIGH (ref 70–99)

## 2018-06-09 LAB — TROPONIN I
Troponin I: <0.03 ng/mL (ref <0.03)
Troponin I: <0.03 ng/mL (ref <0.03)

## 2018-06-09 MED ORDER — TECHNETIUM TC 99M TETROFOSMIN IV KIT
10.0000 | PACK | Freq: Once | INTRAVENOUS | Status: AC | PRN
  Administered 2018-06-09: 10 via INTRAVENOUS

## 2018-06-09 MED ORDER — ATORVASTATIN CALCIUM 40 MG PO TABS: 40.0000 mg | ORAL_TABLET | Freq: Every day | ORAL | 0 refills | Status: DC

## 2018-06-09 MED ORDER — TECHNETIUM TC 99M TETROFOSMIN IV KIT
30.0000 | PACK | Freq: Once | INTRAVENOUS | Status: AC | PRN
  Administered 2018-06-09: 30 via INTRAVENOUS

## 2018-06-09 MED ORDER — PANTOPRAZOLE SODIUM 40 MG IV SOLR: 40.0000 mg | Freq: Every day | INTRAVENOUS | 1 refills | Status: DC

## 2018-06-09 MED ORDER — REGADENOSON 0.4 MG/5ML IV SOLN
INTRAVENOUS | Status: AC
  Administered 2018-06-09: 0.4 mg via INTRAVENOUS
  Filled 2018-06-09: qty 5

## 2018-06-09 MED ORDER — METOPROLOL TARTRATE 25 MG PO TABS: 12.5000 mg | ORAL_TABLET | Freq: Two times a day (BID) | ORAL | 0 refills | Status: DC

## 2018-06-09 MED ORDER — METOPROLOL SUCCINATE ER 25 MG PO TB24: 12.5000 mg | ORAL_TABLET | Freq: Every day | ORAL | Status: DC

## 2018-06-09 MED ORDER — REGADENOSON 0.4 MG/5ML IV SOLN
0.4000 mg | Freq: Once | INTRAVENOUS | Status: AC
  Administered 2018-06-09: 0.4 mg via INTRAVENOUS
  Filled 2018-06-09: qty 5

## 2018-06-09 NOTE — Discharge Instructions (Signed)
Follow with Primary MD Tabori, Katherine E, MD in 7 days   Get CBC, CMP, checked  by Primary MD next visit.    Activity: As tolerated with Full fall precautions use walker/cane & assistance as needed   Disposition Home    Diet: Heart Healthy    On your next visit with your primary care physician please Get Medicines reviewed and adjusted.   Please request your Prim.MD to go over all Hospital Tests and Procedure/Radiological results at the follow up, please get all Hospital records sent to your Prim MD by signing hospital release before you go home.   If you experience worsening of your admission symptoms, develop shortness of breath, life threatening emergency, suicidal or homicidal thoughts you must seek medical attention immediately by calling 911 or calling your MD immediately  if symptoms less severe.  You Must read complete instructions/literature along with all the possible adverse reactions/side effects for all the Medicines you take and that have been prescribed to you. Take any new Medicines after you have completely understood and accpet all the possible adverse reactions/side effects.   Do not drive, operating heavy machinery, perform activities at heights, swimming or participation in water activities or provide baby sitting services if your were admitted for syncope or siezures until you have seen by Primary MD or a Neurologist and advised to do so again.  Do not drive when taking Pain medications.    Do not take more than prescribed Pain, Sleep and Anxiety Medications  Special Instructions: If you have smoked or chewed Tobacco  in the last 2 yrs please stop smoking, stop any regular Alcohol  and or any Recreational drug use.  Wear Seat belts while driving.   Please note  You were cared for by a hospitalist during your hospital stay. If you have any questions about your discharge medications or the care you received while you were in the hospital after you are  discharged, you can call the unit and asked to speak with the hospitalist on call if the hospitalist that took care of you is not available. Once you are discharged, your primary care physician will handle any further medical issues. Please note that NO REFILLS for any discharge medications will be authorized once you are discharged, as it is imperative that you return to your primary care physician (or establish a relationship with a primary care physician if you do not have one) for your aftercare needs so that they can reassess your need for medications and monitor your lab values.  

## 2018-06-09 NOTE — Consult Note (Signed)
Reason for Consult: Chest pain Referring Physician: [facility] hospitalist  Tracey Evans is an 73 y.o. female.  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  Patient also gives history of exertional dyspnea with minimal.  While in church yesterday again had funny feeling in the chest associated near syncopal episode decided to call EMS was admitted.  In route patient received aspirin and nitro with relief of chest pain.  EKG showed no acute ischemic changes 3 sets of troponin I has been negative.  Denies any recent cardiac work-up  Past Medical History:  Diagnosis Date  . Angina at rest [facility])    "occurs whenever it wants to, but worse during agitation"  . Anxiety   . Asthma   . Binge eating disorder   . Breast cancer of lower-outer quadrant of left female breast ([place]) 07/08/2015   "NEVER HAD LEFT BREAST CANCER" (08/15/2015)  . Cancer of right breast ([place]) 08/15/2015  . Chronic bronchitis ([place])   . Chronic lower back pain   . Complication of anesthesia    had bronc spasms during intubation surgery 2009 on foot-need albuterol inhaler or neb tx preop  . Costochondritis   . Depression   . Diverticular disease   . Fibromyalgia   . GERD (gastroesophageal reflux disease)   . Hot flashes   . Hyperlipidemia   . Hypertension   . Hypothyroidism   . Osteoarthritis    "all over"  . Peptic ulcer disease   . Personal history of chemotherapy   . Shortness of breath dyspnea   . Type II diabetes mellitus ([place])    "diet controlled" (08/15/2015)    Past Surgical History:  Procedure Laterality Date  . ABDOMINAL HYSTERECTOMY     "left my ovaries"   . ANKLE ARTHROSCOPY  01/02/2012   Procedure: ANKLE ARTHROSCOPY;  Surgeon: Colin Rhein, MD;  Location: [facility];  Service: Orthopedics;  Laterality: Right;  right ankle arthroscopy with extensive debridement , dridement and drilling talar dome osteochondral lesion  . ANKLE FRACTURE SURGERY Right 2009   Dr. Rolena Infante  . BREAST BIOPSY  07/2015  . CARDIAC CATHETERIZATION    . CARPAL TUNNEL RELEASE Bilateral   . COLONOSCOPY    . DILATION AND CURETTAGE OF UTERUS     "before hysterectomy"  . FRACTURE SURGERY    . KNEE ARTHROSCOPY Bilateral   . LAPAROSCOPIC CHOLECYSTECTOMY    . MASTECTOMY COMPLETE / SIMPLE W/ SENTINEL NODE BIOPSY Right 08/15/2015  . MASTECTOMY W/ SENTINEL NODE BIOPSY Right 08/15/2015   Procedure: TOTAL MASTECTOMY WITH SENTINEL LYMPH NODE BIOPSY AND BLUE DYE INJECTION ;  Surgeon: Fanny Skates, MD;  Location: [facility];  Service: General;  Laterality: Right;  . PORT-A-CATH REMOVAL N/A 11/12/2016   Procedure: REMOVAL PORT-A-CATH;  Surgeon: Jackolyn Confer, MD;  Location[facility];  Service: General;  Laterality: N/A;  . PORTACATH PLACEMENT Left 08/15/2015  . PORTACATH PLACEMENT Left 08/15/2015   Procedure: INSERTION PORT-A-CATH ;  Surgeon: Fanny Skates, MD;  Location: [facility];  Service: General;  Laterality: Left;  . SHOULDER ARTHROSCOPY Bilateral 2009-2011   "bone spurs"  . TONSILLECTOMY      Family History  Problem Relation Age of Onset  . Dementia Mother   . Stroke  Mother   . Heart Problems Mother   . Dementia Father   . Alcohol abuse Father   . Colon cancer Father        dx. 47 or younger  . Breast cancer Sister 62       inflammatory  . Diverticulitis Sister        maternal half-sister; severe - causing partial colectomy  . Breast cancer Sister        maternal half-sister; dx. early 53s  . Breast cancer Other 64       niece  . Breast cancer Other        niece dx. 20s  . Alcohol abuse Brother     Social History:  reports that she has  never smoked. She has never used smokeless tobacco. She reports previous alcohol use. She reports that she does not use drugs.  Allergies:  Allergies  Allergen Reactions  . Contrast Media [Iodinated Diagnostic Agents] Shortness Of Breath    Patient has SOB, tightness in chest and feels like an elephant is sitting on chest, patient should be  scanned at hospital Per Dr. Alvester Chou  . Dilaudid [Hydromorphone Hcl] Anaphylaxis    Pt stopped breathing  . Adhesive [Tape]     blister  . Codeine Nausea Only    insomnia  . Lactose Intolerance (Gi) Diarrhea    Medications: I have reviewed the patient's current medications.  Results for orders placed or performed during the hospital encounter of 06/08/18 (from the past 48 hour(s))  CBC     Status: None   Collection Time: 06/08/18 12:50 PM  Result Value Ref Range   WBC 6.7 4.0 - 10.5 K/uL   RBC 4.30 3.87 - 5.11 MIL/uL   Hemoglobin 12.8 12.0 - 15.0 g/dL   HCT 40.8 36.0 - 46.0 %   MCV 94.9 80.0 - 100.0 fL   MCH 29.8 26.0 - 34.0 pg   MCHC 31.4 30.0 - 36.0 g/dL   RDW 13.8 11.5 - 15.5 %   Platelets 273 150 - 400 K/uL   nRBC 0.0 0.0 - 0.2 %    Comment: Performed at [facility], [place] [address], [place], [place] [address]  Comprehensive metabolic panel     Status: Abnormal   Collection Time: 06/08/18 12:50 PM  Result Value Ref Range   Sodium 140 135 - 145 mmol/L   Potassium 3.9 3.5 - 5.1 mmol/L   Chloride 107 98 - 111 mmol/L   CO2 28 22 - 32 mmol/L   Glucose, Bld 97 70 - 99 mg/dL   BUN 9 8 - 23 mg/dL   Creatinine, Ser 1.14 (H) 0.44 - 1.00 mg/dL   Calcium 9.0 8.9 - 10.3 mg/dL   Total Protein 6.4 (L) 6.5 - 8.1 g/dL   Albumin 3.6 3.5 - 5.0 g/dL   AST 18 15 - 41 U/L   ALT 18 0 - 44 U/L   Alkaline Phosphatase 117 38 - 126 U/L   Total Bilirubin 0.5 0.3 - 1.2 mg/dL   GFR calc non Af Amer 48 (L) >60 mL/min   GFR calc Af Amer 56 (L) >60 mL/min   Anion gap 5 5 - 15    Comment: Performed at [facility] [address]  [place], [place] [address]  Lipase, blood     Status: None   Collection Time: 06/08/18 12:50 PM  Result Value Ref Range   Lipase 31 11 - 51 U/L    Comment: Performed at [facility]  [facility], [place] [address] [place], [place] [address]  I-stat troponin, ED (0, 3)     Status: None   Collection Time: 06/08/18  1:06 PM  Result Value Ref Range   Troponin i, poc 0.00 0.00 - 0.08 ng/mL   Comment 3            Comment: Due to the release kinetics of cTnI, a negative result within the first hours of the onset of symptoms does not rule out myocardial infarction with certainty. If myocardial infarction is still suspected, repeat the test at appropriate intervals.   Glucose, capillary     Status: Abnormal   Collection Time: 06/08/18  5:58 PM  Result Value Ref Range   Glucose-Capillary 104 (H) 70 - 99 mg/dL  Troponin I - Now Then Q6H     Status: None   Collection Time: 06/08/18  6:37 PM  Result Value Ref Range   Troponin I <0.03 <0.03 ng/mL    Comment: Performed at [facility] [address] [place], [place] [address]  Glucose, capillary     Status: None   Collection Time: 06/08/18  9:04 PM  Result Value Ref Range   Glucose-Capillary 90 70 - 99 mg/dL  Troponin I - Now Then Q6H     Status: None   Collection Time: 06/09/18 12:43 AM  Result Value Ref Range   Troponin I <0.03 <0.03 ng/mL    Comment: Performed at [facility] [address], [place], [place] [address]  Troponin I - Now Then Q6H     Status: None   Collection Time: 06/09/18  6:40 AM  Result Value Ref Range   Troponin I <0.03 <0.03 ng/mL    Comment: Performed at [facility] [address], [place],  [address]  Glucose, capillary     Status: Abnormal   Collection Time: 06/09/18  7:58 AM  Result Value Ref Range   Glucose-Capillary 101 (H) 70 - 99 mg/dL    Dg Chest Portable 1 View  Result Date: 06/08/2018 CLINICAL DATA:  left sided chest pain today at 1115 with 5/10 pain. Pt states she had an episode of  CP yesterday afternoon around 1500. Pt states she had cp when she was "singing." Given 324 aspirin and 3 tabs of Nitroglycerin SL by EMS en route to hospital and is now pain free. Pt states she has hx of angina and HTN. Pt also reports a dry cough that she has experienced for a few months that gives her dizzy spells. EXAM: PORTABLE CHEST - 1 VIEW COMPARISON:  03/01/2016 and previous FINDINGS: Lungs are clear. Heart size and mediastinal contours are within normal limits. Tortuous thoracic aorta. No effusion.  No pneumothorax. Visualized bones unremarkable. IMPRESSION: No acute cardiopulmonary disease. Electronically Signed   By: Lucrezia Europe M.D.   On: 06/08/2018 13:47    Review of Systems  Constitutional: Positive for chills and fever. Negative for diaphoresis, malaise/fatigue and weight loss.  HENT: Negative for hearing loss.   Eyes: Negative for blurred vision.  Cardiovascular: Positive for chest pain and palpitations. Negative for claudication and leg swelling.  Gastrointestinal: Negative for abdominal pain and vomiting.  Genitourinary: Negative for dysuria.  Skin: Negative for rash.  Neurological: Negative for dizziness.   Blood pressure 134/89, pulse 68, temperature 98.3 F (36.8 C), resp. rate 18, height 5\' 5"  (1.651 m), weight 92.4 kg, SpO2 97 %. Physical Exam  Constitutional: She is oriented to person, place, and time.  HENT:  Head: Normocephalic and atraumatic.  Eyes: Pupils are equal, round, and reactive to light. Conjunctivae are normal.  Neck: Normal range of motion. Neck supple. No tracheal deviation present. No thyromegaly present.  Cardiovascular: Normal rate and regular rhythm.  Murmur (Soft systolic murmur noted no S3 gallop) heard. Respiratory: Effort normal and breath sounds normal. No respiratory distress. She has no wheezes. She has no rales.  GI: Soft. Bowel sounds are normal. She exhibits no distension. There is no abdominal tenderness. There is no rebound.   Musculoskeletal:        General: No tenderness, deformity or edema.  Neurological: She is alert and oriented to person, place, and time.    Assessment/Plan: Stable angina MI ruled out Nonobstructive CAD with myocardial bridging Hypertension Hyperlipidemia Hypothyroidism  Fibromyalgia osteoarthritis History of carcinoma of the breast Depression Plan Agree with present management Consider adding low-dose beta-blocker as heart rate tolerates We will schedule for [facility] today [name] 06/09/2018, 9:10 AM

## 2018-06-09 NOTE — Discharge Summary (Signed)
Tracey Evans, is a 73 y.o. female  DOB 28-Jan-1946  MRN [id number].  Admission date:  06/08/2018  Admitting Physician  Karmen Bongo, MD  Discharge Date:  06/09/2018   Primary MD  Midge Minium, MD  Recommendations for primary care physician for things to follow:  -Please check CBC, BMP during next visit -To follow with cardiology as an outpatient -Repeat lipid profile in 3 months as her Lipitor dose has been increased   Admission Diagnosis  chest pain   Discharge Diagnosis  chest pain    Principal Problem:   Chest pain Active Problems:   Hypothyroidism   Hyperlipidemia associated with type 2 diabetes mellitus ([place])   Essential hypertension   DM w/o complication type II ([place])   Depression   Breast cancer of lower-outer quadrant of right female breast ([place])   CKD (chronic kidney disease) stage 2, GFR 60-89 ml/min      Past Medical History:  Diagnosis Date  . Angina at rest [facility])    "occurs whenever it wants to, but worse during agitation"  . Anxiety   . Asthma   . Binge eating disorder   . Breast cancer of lower-outer quadrant of left female breast ([place]) 07/08/2015   "NEVER HAD LEFT BREAST CANCER" (08/15/2015)  . Cancer of right breast ([place]) 08/15/2015  . Chronic bronchitis ([place])   . Chronic lower back pain   . Complication of anesthesia    had bronc spasms during intubation surgery 2009 on foot-need albuterol inhaler or neb tx preop  . Costochondritis   . Depression   . Diverticular disease   . Fibromyalgia   . GERD (gastroesophageal reflux disease)   . Hot flashes   . Hyperlipidemia   . Hypertension   . Hypothyroidism   . Osteoarthritis    "all over"  . Peptic ulcer disease   . Personal history of chemotherapy   . Shortness of breath dyspnea   . Type II diabetes mellitus ([place])    "diet controlled" (08/15/2015)    Past Surgical History:  Procedure Laterality Date  .  ABDOMINAL HYSTERECTOMY     "left my ovaries"  . ANKLE ARTHROSCOPY  01/02/2012   Procedure: ANKLE ARTHROSCOPY;  Surgeon: Colin Rhein, MD;  Location: [place];  Service: Orthopedics;  Laterality: Right;  right ankle arthroscopy with extensive debridement , dridement and drilling talar dome osteochondral lesion  . ANKLE FRACTURE SURGERY Right 2009   Dr. Rolena Infante  . BREAST BIOPSY  07/2015  . CARDIAC CATHETERIZATION    . CARPAL TUNNEL RELEASE Bilateral   . COLONOSCOPY    . DILATION AND CURETTAGE OF UTERUS     "before hysterectomy"  . FRACTURE SURGERY    . KNEE ARTHROSCOPY Bilateral   . LAPAROSCOPIC CHOLECYSTECTOMY    . MASTECTOMY COMPLETE / SIMPLE W/ SENTINEL NODE BIOPSY Right 08/15/2015  . MASTECTOMY W/ SENTINEL NODE BIOPSY Right 08/15/2015   Procedure: TOTAL MASTECTOMY WITH SENTINEL LYMPH NODE BIOPSY AND BLUE DYE INJECTION ;  Surgeon: Fanny Skates, MD;  Location: [facility];  Service: General;  Laterality: Right;  . PORT-A-CATH REMOVAL N/A 11/12/2016   Procedure: REMOVAL PORT-A-CATH;  Surgeon: Jackolyn Confer, MD;  [facility];  Service: General;  Laterality: N/A;  . PORTACATH PLACEMENT Left 08/15/2015  . PORTACATH PLACEMENT Left 08/15/2015   Procedure: INSERTION PORT-A-CATH ;  Surgeon: Fanny Skates, MD;  Location: [facility];  Service: General;  Laterality: Left;  . SHOULDER ARTHROSCOPY Bilateral 2009-2011   "bone spurs"  . TONSILLECTOMY         History of present illness and  Hospital Course:     Kindly see H&P for history of present illness and admission details, please review complete Labs, Consult reports and Test reports for all details in brief  HPI  from the history and physical done on the day of admission 06/08/2018  HPI: Tracey Evans is a 73 y.o. female with medical history significant of DM; hypothyroidism; HTN; HLD; fibromyalgia; and breast cancer presenting with chest pain.  Patient started having some weird chest pain yesterday while at  choir rehearsal.  Normally her angina goes down her arm.  Yesterday, the pain was fixed in her left upper chest and she felt flutters.  Today, she had the same issue in the same place and today she felt like she might pass out.  She has been coughing a lot, uncertain why - when this happens, she gets very dizzy like she might pass out.  The pain resolved both times with NTG - 2 yesterday and 3 today.  The pain resolved faster yesterday than today.  Both episodes were exertional.  It has been more than 10 years since last stress or cath.  She is pain-free at this time.   ED Course:  Chest pain yesterday and today.  Likely needs ACS rule-out.  Has had remote heart caths, has "different anatomy in her heart... has some angina from that."  CP resolved with NTG both days.  Troponin negative, EKG ok.  Hospital Course     Chest pain -Has some nontypical features, as it is reproducible by palpation on my physical exam, possibly related to costochondritis or fibromyalgia, but given her symptoms of presentation at exertion, and resolution by rest, she was admitted for further work-up. -Primary cardiologist Dr. Terrence Dupont, his input greatly appreciated, EKG nonacute, she had negative troponins, and for nuclear stress test with no evidence of reversible ischemia or infarction, overall low risk stress test , patient can be discharged today as per my discussion with Dr. Terrence Dupont, and he will follow as an outpatient . 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 twice daily, heart rate on telemetry has been in the 80s and 90s during hospital stay .  HTN -Continue Norvasc -She was started on low-dose metoprolol, mainly for myocardial bridging  HLD -Continue Lipitor, increase to 40 mg daily based on recent LDL >100  DM -Recent A1c was 6.5 -She does not appear to be on medications for DM at this time  H/o breast cancer -s/p R mastectomy, chemotherapy, and  anti-estrogen therapy -Note the Letrozole is currently being held, as above -In remission as of last appointment in 1/20  Stage 2-3 CKD -Appears to be at/near baseline at this time  Depression -Continue home medications - Wellbutrin, Celexa, and Xanax prn  Hypothyroidism -Recent normal TSH -Continue Synthroid at current dose for now     Discharge Condition:  STABLE   Follow UP  Follow-up Information  [name], MD Follow up.   Specialty:  Cardiology Contact information: [address] [place] [address] [phone]             Discharge Instructions  and  Discharge Medications    Discharge Instructions    Discharge instructions   Complete by:  As directed    Follow with Primary MD Midge Minium, MD in 7 days   Get CBC, CMP,  checked  by Primary MD next visit.    Activity: As tolerated with Full fall precautions use walker/cane & assistance as needed   Disposition Home    Diet: Heart Healthy    On your next visit with your primary care physician please Get Medicines reviewed and adjusted.   Please request your Prim.MD to go over all Hospital Tests and Procedure/Radiological results at the follow up, please get all Hospital records sent to your Prim MD by signing hospital release before you go home.   If you experience worsening of your admission symptoms, develop shortness of breath, life threatening emergency, suicidal or homicidal thoughts you must seek medical attention immediately by calling 911 or calling your MD immediately  if symptoms less severe.  You Must read complete instructions/literature along with all the possible adverse reactions/side effects for all the Medicines you take and that have been prescribed to you. Take any new Medicines after you have completely understood and accpet all the possible adverse reactions/side effects.   Do not drive, operating heavy machinery, perform activities at  heights, swimming or participation in water activities or provide baby sitting services if your were admitted for syncope or siezures until you have seen by Primary MD or a Neurologist and advised to do so again.  Do not drive when taking Pain medications.    Do not take more than prescribed Pain, Sleep and Anxiety Medications  Special Instructions: If you have smoked or chewed Tobacco  in the last 2 yrs please stop smoking, stop any regular Alcohol  and or any Recreational drug use.  Wear Seat belts while driving.   Please note  You were cared for by a hospitalist during your hospital stay. If you have any questions about your discharge medications or the care you received while you were in the hospital after you are discharged, you can call the unit and asked to speak with the hospitalist on call if the hospitalist that took care of you is not available. Once you are discharged, your primary care physician will handle any further medical issues. Please note that NO REFILLS for any discharge medications will be authorized once you are discharged, as it is imperative that you return to your primary care physician (or establish a relationship with a primary care physician if you do not have one) for your aftercare needs so that they can reassess your need for medications and monitor your lab values.   Increase activity slowly   Complete by:  As directed      Allergies as of 06/09/2018      Reactions   Contrast Media [iodinated Diagnostic Agents] Shortness Of Breath   Patient has SOB, tightness in chest and feels like an elephant is sitting on chest, patient should be  scanned at hospital Per Dr. Alvester Chou   Dilaudid [hydromorphone Hcl] Anaphylaxis   Pt stopped breathing   Adhesive [tape]    blister   Codeine Nausea Only   insomnia   Lactose Intolerance (gi) Diarrhea      Medication List  TAKE these medications   albuterol 108 (90 Base) MCG/ACT inhaler Commonly known as:  PROAIR  HFA Inhale 2 puffs into the lungs every 6 (six) hours as needed for wheezing.   ALPRAZolam 1 MG tablet Commonly known as:  XANAX Take 1 tablet (1 mg total) by mouth 2 (two) times daily as needed. What changed:  reasons to take this   amLODipine 2.5 MG tablet Commonly known as:  NORVASC TAKE 1 TABLET BY MOUTH EVERY DAY   aspirin 81 MG tablet Take 81 mg by mouth daily.   atorvastatin 40 MG tablet Commonly known as:  LIPITOR Take 1 tablet (40 mg total) by mouth daily at 6 PM. What changed:    medication strength  how much to take  how to take this  when to take this  additional instructions   buPROPion 300 MG 24 hr tablet Commonly known as:  WELLBUTRIN XL TAKE 1 TABLET BY MOUTH EVERY DAY   citalopram 20 MG tablet Commonly known as:  CELEXA Take 1 tablet (20 mg total) by mouth daily.   diphenhydrAMINE 50 MG tablet Commonly known as:  BENADRYL Take one tablet (50mg s total) one hour prior to study.   letrozole 2.5 MG tablet Commonly known as:  FEMARA Take 1 tablet (2.5 mg total) by mouth daily.   levothyroxine 75 MCG tablet Commonly known as:  SYNTHROID, LEVOTHROID TAKE 1 TABLET BY MOUTH EVERY DAY What changed:  when to take this   meloxicam 15 MG tablet Commonly known as:  MOBIC TAKE 1 TABLET BY MOUTH EVERY DAY   methocarbamol 500 MG tablet Commonly known as:  ROBAXIN Take 500 mg by mouth every 6 (six) hours as needed for muscle spasms.   metoprolol tartrate 25 MG tablet Commonly known as:  LOPRESSOR Take 0.5 tablets (12.5 mg total) by mouth 2 (two) times daily.   nitroGLYCERIN 0.4 MG SL tablet Commonly known as:  NITROSTAT Place 1 tablet (0.4 mg total) under the tongue every 5 (five) minutes as needed. As needed for chest pain What changed:    reasons to take this  additional instructions   pantoprazole 40 MG injection Commonly known as:  PROTONIX Inject 40 mg into the vein at bedtime.   ranitidine 150 MG tablet Commonly known as:  ZANTAC Take  150 mg by mouth 2 (two) times daily as needed for heartburn. Reported on 10/05/2015   REFRESH DRY EYE THERAPY OP Place 1 drop into both eyes daily as needed (dry eyes).   SYSTANE ULTRA 0.4-0.3 % Soln Generic drug:  Polyethyl Glycol-Propyl Glycol Place 1 drop into the right eye 3 (three) times daily as needed (for dry eyes).   tiZANidine 4 MG tablet Commonly known as:  ZANAFLEX Take 4 mg by mouth every 8 (eight) hours as needed for muscle spasms.         Diet and Activity recommendation: See Discharge Instructions above   Consults obtained -  Cardiology Dr Terrence Dupont   Major procedures and Radiology Reports - PLEASE review detailed and final reports for all details, in brief -     Nm Myocar Multi W/spect W/wall Motion / Ef  Result Date: 06/09/2018 CLINICAL DATA:  Chest pain EXAM: MYOCARDIAL IMAGING WITH SPECT (REST AND PHARMACOLOGIC-STRESS) GATED LEFT VENTRICULAR WALL MOTION STUDY LEFT VENTRICULAR EJECTION FRACTION TECHNIQUE: Standard myocardial SPECT imaging was performed after resting intravenous injection of 10 mCi Tc-63m tetrofosmin. Subsequently, intravenous infusion of Lexiscan was performed under the supervision of the Cardiology staff. At peak effect of the drug,  30 mCi Tc-36m tetrofosmin was injected intravenously and standard myocardial SPECT imaging was performed. Quantitative gated imaging was also performed to evaluate left ventricular wall motion, and estimate left ventricular ejection fraction. COMPARISON:  None. FINDINGS: Perfusion: No decreased activity in the left ventricle on stress imaging to suggest reversible ischemia or infarction. Wall Motion: Normal left ventricular wall motion. No left ventricular dilation. Left Ventricular Ejection Fraction: 66 % End diastolic volume 64 ml End systolic volume 62 ml IMPRESSION: 1. No reversible ischemia or infarction. 2. Normal left ventricular wall motion. 3. Left ventricular ejection fraction 66% 4. Non invasive risk  stratification*: Low *2012 Appropriate Use Criteria for Coronary Revascularization Focused Update: J Am Coll Cardiol. 4967;59(1):638-466. http://content.airportbarriers.com.aspx?articleid=[email or web address] Electronically Signed   By: Julian Hy M.D.   On: 06/09/2018 13:33   Dg Chest Portable 1 View  Result Date: 06/08/2018 CLINICAL DATA:  left sided chest pain today at 1115 with 5/10 pain. Pt states she had an episode of CP yesterday afternoon around 1500. Pt states she had cp when she was "singing." Given 324 aspirin and 3 tabs of Nitroglycerin SL by EMS en route to hospital and is now pain free. Pt states she has hx of angina and HTN. Pt also reports a dry cough that she has experienced for a few months that gives her dizzy spells. EXAM: PORTABLE CHEST - 1 VIEW COMPARISON:  03/01/2016 and previous FINDINGS: Lungs are clear. Heart size and mediastinal contours are within normal limits. Tortuous thoracic aorta. No effusion.  No pneumothorax. Visualized bones unremarkable. IMPRESSION: No acute cardiopulmonary disease. Electronically Signed   By: Lucrezia Europe M.D.   On: 06/08/2018 13:47    Micro Results     No results found for this or any previous visit (from the past 240 hour(s)).     Today   Subjective:   Tracey Evans today has no headache,no  abdominal pain,no new weakness tingling or numbness, feels much better wants to go home today.   Objective:   Blood pressure 132/85, pulse 68, temperature 98.3 F (36.8 C), resp. rate 18, height 5\' 5"  (1.651 m), weight 92.4 kg, SpO2 97 %.   Intake/Output Summary (Last 24 hours) at 06/09/2018 1510 Last data filed at 06/09/2018 1351 Gross per 24 hour  Intake 600 ml  Output -  Net 600 ml    Exam Awake Alert, Oriented x 3, No new F.N deficits, Normal affect  Symmetrical Chest wall movement, Good air movement bilaterally, CTAB, patient did have reproducible left-sided chest pain on palpation RRR,No Gallops,Rubs or new Murmurs, No Parasternal  Heave +ve B.Sounds, Abd Soft, Non tender,  No rebound -guarding or rigidity. No Cyanosis, Clubbing or edema, No new Rash or bruise  Data Review   CBC w Diff:  Lab Results  Component Value Date   WBC 6.7 06/08/2018   HGB 12.8 06/08/2018   HGB 12.7 11/30/2016   HCT 40.8 06/08/2018   HCT 39.1 11/30/2016   PLT 273 06/08/2018   PLT 251 11/30/2016   LYMPHOPCT 37.3 04/30/2018   LYMPHOPCT 39.0 11/30/2016   MONOPCT 5.9 04/30/2018   MONOPCT 6.8 11/30/2016   EOSPCT 3.5 04/30/2018   EOSPCT 5.6 11/30/2016   BASOPCT 0.7 04/30/2018   BASOPCT 0.7 11/30/2016    CMP:  Lab Results  Component Value Date   NA 140 06/08/2018   NA 143 11/30/2016   K 3.9 06/08/2018   K 4.2 11/30/2016   CL 107 06/08/2018   CO2 28 06/08/2018   CO2 25 11/30/2016  BUN 9 06/08/2018   BUN 17.3 11/30/2016   CREATININE 1.14 (H) 06/08/2018   CREATININE 1.1 11/30/2016   PROT 6.4 (L) 06/08/2018   PROT 6.7 11/30/2016   ALBUMIN 3.6 06/08/2018   ALBUMIN 3.6 11/30/2016   BILITOT 0.5 06/08/2018   BILITOT 0.49 11/30/2016   ALKPHOS 117 06/08/2018   ALKPHOS 102 11/30/2016   AST 18 06/08/2018   AST 16 11/30/2016   ALT 18 06/08/2018   ALT 21 11/30/2016  .   Total Time in preparing paper work, data evaluation and todays exam - 29 minutes  Phillips Climes M.D on 06/09/2018 at 3:10 PM  [facility]   Office  [phone]

## 2018-06-09 NOTE — Care Management Obs Status (Signed)
[facility] NOTIFICATION   Patient Details  Name: ISATU MACINNES MRN: [id number] Date of Birth: 07/27/1945   Medicare Observation Status Notification Given:  Yes    Bethena Roys, RN 06/09/2018, 3:11 PM

## 2018-06-10 ENCOUNTER — Telehealth: Payer: Self-pay

## 2018-06-10 NOTE — Telephone Encounter (Signed)
Last fill 03/25/18 Last OV 04/30/2018  Ok to fill?

## 2018-06-10 NOTE — Telephone Encounter (Signed)
Transition Care Management Follow-up Telephone Call  06/08/2018-06/09/2018 Principal Problem: Chest pain   How have you been since you were released from the hospital? "I was fine til this morning when I fell 4 times". Pt reports falling/slipping out of the bathtub this morning, landing on her buttocks. She reports her knee "gives out" and she continued to fall while attempting to get up. Pt states she did not have her walker with her, daughter assisted with getting up. Pt states she did not hit her head, only injury is a bruise and cut on knee.    Do you understand why you were in the hospital? yes   Do you understand the discharge instructions? yes   Where were you discharged to? Home. Lives alone. Family members live close.    Items Reviewed:  Medications reviewed: yes  Allergies reviewed: yes  Dietary changes reviewed: yes  Referrals reviewed: yes   Functional Questionnaire:   Activities of Daily Living (ADLs):   She states they are independent in the following: ambulation, bathing and hygiene, feeding, continence, grooming, toileting and dressing. Uses walker occasionally.  States they require assistance with the following: None.    Any transportation issues/concerns?: no   Any patient concerns? no   Confirmed importance and date/time of follow-up visits scheduled yes  Provider Appointment booked with PCP 06/19/2018  Confirmed with patient if condition begins to worsen call PCP or go to the ER.  Patient was given the office number and encouraged to call back with question or concerns.  : yes

## 2018-06-18 DIAGNOSIS — E785 Hyperlipidemia, unspecified: Secondary | ICD-10-CM | POA: Diagnosis not present

## 2018-06-18 DIAGNOSIS — M1990 Unspecified osteoarthritis, unspecified site: Secondary | ICD-10-CM | POA: Diagnosis not present

## 2018-06-18 DIAGNOSIS — E039 Hypothyroidism, unspecified: Secondary | ICD-10-CM | POA: Diagnosis not present

## 2018-06-18 DIAGNOSIS — M797 Fibromyalgia: Secondary | ICD-10-CM | POA: Diagnosis not present

## 2018-06-18 DIAGNOSIS — I10 Essential (primary) hypertension: Secondary | ICD-10-CM | POA: Diagnosis not present

## 2018-06-18 DIAGNOSIS — I2510 Atherosclerotic heart disease of native coronary artery without angina pectoris: Secondary | ICD-10-CM | POA: Diagnosis not present

## 2018-06-19 ENCOUNTER — Inpatient Hospital Stay: Payer: Medicare Other | Admitting: Family Medicine

## 2018-06-24 ENCOUNTER — Ambulatory Visit (INDEPENDENT_AMBULATORY_CARE_PROVIDER_SITE_OTHER): Payer: Medicare Other | Admitting: Family Medicine

## 2018-06-24 ENCOUNTER — Other Ambulatory Visit: Payer: Self-pay

## 2018-06-24 ENCOUNTER — Encounter: Payer: Self-pay | Admitting: Family Medicine

## 2018-06-24 VITALS — BP 110/72 | HR 66 | Temp 98.1°F | Resp 16 | Ht 65.0 in | Wt 205.5 lb

## 2018-06-24 DIAGNOSIS — R079 Chest pain, unspecified: Secondary | ICD-10-CM

## 2018-06-24 DIAGNOSIS — F3342 Major depressive disorder, recurrent, in full remission: Secondary | ICD-10-CM

## 2018-06-24 DIAGNOSIS — I10 Essential (primary) hypertension: Secondary | ICD-10-CM | POA: Diagnosis not present

## 2018-06-24 DIAGNOSIS — E1169 Type 2 diabetes mellitus with other specified complication: Secondary | ICD-10-CM

## 2018-06-24 DIAGNOSIS — E785 Hyperlipidemia, unspecified: Secondary | ICD-10-CM | POA: Diagnosis not present

## 2018-06-24 LAB — BASIC METABOLIC PANEL
BUN: 18 mg/dL (ref 6–23)
CO2: 31 mEq/L (ref 19–32)
Calcium: 9.1 mg/dL (ref 8.4–10.5)
Chloride: 106 mEq/L (ref 96–112)
Creatinine, Ser: 0.97 mg/dL (ref 0.40–1.20)
GFR: 68.23 mL/min (ref >60.00)
Glucose, Bld: 98 mg/dL (ref 70–99)
Potassium: 4 mEq/L (ref 3.5–5.1)
Sodium: 144 mEq/L (ref 135–145)

## 2018-06-24 LAB — CBC WITH DIFFERENTIAL/PLATELET
BASOS PCT: 0.6 % (ref 0.0–3.0)
Basophils Absolute: 0.1 10*3/uL (ref 0.0–0.1)
EOS ABS: 0.3 10*3/uL (ref 0.0–0.7)
Eosinophils Relative: 2.9 % (ref 0.0–5.0)
HCT: 38.4 % (ref 36.0–46.0)
Hemoglobin: 12.7 g/dL (ref 12.0–15.0)
Lymphocytes Relative: 36.2 % (ref 12.0–46.0)
Lymphs Abs: 3.1 10*3/uL (ref 0.7–4.0)
MCHC: 33.2 g/dL (ref 30.0–36.0)
MCV: 93.3 fl (ref 78.0–100.0)
Monocytes Absolute: 0.6 10*3/uL (ref 0.1–1.0)
Monocytes Relative: 7.2 % (ref 3.0–12.0)
Neutro Abs: 4.6 10*3/uL (ref 1.4–7.7)
Neutrophils Relative %: 53.1 % (ref 43.0–77.0)
Platelets: 286 10*3/uL (ref 150.0–400.0)
RBC: 4.11 Mil/uL (ref 3.87–5.11)
RDW: 14.7 % (ref 11.5–15.5)
WBC: 8.7 10*3/uL (ref 4.0–10.5)

## 2018-06-24 MED ORDER — BUPROPION HCL ER (XL) 150 MG PO TB24: 150.0000 mg | ORAL_TABLET | Freq: Every day | ORAL | 3 refills | Status: DC

## 2018-06-24 NOTE — Progress Notes (Signed)
   Subjective:    Patient ID: Tracey Evans, female    DOB: 08-01-1945, 73 y.o.   MRN: [id number]  [facility] f/u- pt was admitted 3/1-2 for chest pain and ACS r/o.  EKG, Troponin, and nuclear stress WNL.  Has cardiology f/u w/ Dr Terrence Dupont.  Was started on Metoprolol 12.5mg  BID, lipitor was increased to 40mg  daily.  Denies recurrent CP.  No SOB.  No edema.  Depression/anxiety- pt reports her new pastor at church has completely reached her and she feels 'better than I have in ages'.  Wants to start weaning medication.  'I enjoy my life like I used to'.   Review of Systems For ROS see HPI     Objective:   Physical Exam Vitals signs reviewed.  Constitutional:      General: She is not in acute distress.    Appearance: She is well-developed. She is obese.  HENT:     Head: Normocephalic and atraumatic.  Eyes:     Conjunctiva/sclera: Conjunctivae normal.     Pupils: Pupils are equal, round, and reactive to light.  Neck:     Musculoskeletal: Normal range of motion and neck supple.     Thyroid: No thyromegaly.  Cardiovascular:     Rate and Rhythm: Normal rate and regular rhythm.     Heart sounds: Normal heart sounds. No murmur.  Pulmonary:     Effort: Pulmonary effort is normal. No respiratory distress.     Breath sounds: Normal breath sounds.  Abdominal:     General: There is no distension.     Palpations: Abdomen is soft.     Tenderness: There is no abdominal tenderness.  Lymphadenopathy:     Cervical: No cervical adenopathy.  Skin:    General: Skin is warm and dry.  Neurological:     Mental Status: She is alert and oriented to person, place, and time.  Psychiatric:        Behavior: Behavior normal.           Assessment & Plan:

## 2018-06-24 NOTE — Assessment & Plan Note (Signed)
Chronic problem.  Well controlled. BP is tolerating the addition of Metoprolol w/o difficulty.  Will continue to follow.

## 2018-06-24 NOTE — Patient Instructions (Addendum)
Follow up in late May to recheck diabetes We'll notify you of your lab results and make any changes if needed Decrease the Citalopram to 1/2 tab daily Decrease the Bupropion to 150mg  daily- new prescription sent Keep up the good work!  You look great! Schedule your eye exam at your convenience and have them send me a copy Call with any questions or concerns Stay Safe!!!

## 2018-06-24 NOTE — Assessment & Plan Note (Signed)
Chronic problem.  Lipitor was increased due to suboptimal LDL control.  Will continue to follow.

## 2018-06-24 NOTE — Assessment & Plan Note (Signed)
Resolved.  Pt is following w/ Cardiology.  D/C summary, labs, imaging, and nuclear stress test reviewed.  Will repeat CBC and BMP as recommended.

## 2018-06-24 NOTE — Assessment & Plan Note (Signed)
Improved.  Will wean pt as she desires b/c decreasing Citalopram to 1/2 tab daily and Wellbutrin to 150mg  daily.  Pt expressed understanding and is in agreement w/ plan.

## 2018-07-10 ENCOUNTER — Other Ambulatory Visit: Payer: Self-pay | Admitting: Emergency Medicine

## 2018-07-10 DIAGNOSIS — M5090 Cervical disc disorder, unspecified, unspecified cervical region: Secondary | ICD-10-CM

## 2018-07-10 DIAGNOSIS — R05 Cough: Secondary | ICD-10-CM

## 2018-07-10 DIAGNOSIS — R059 Cough, unspecified: Secondary | ICD-10-CM

## 2018-07-10 MED ORDER — ALBUTEROL SULFATE HFA 108 (90 BASE) MCG/ACT IN AERS: 2.0000 | INHALATION_SPRAY | RESPIRATORY_TRACT | 6 refills | Status: DC | PRN

## 2018-07-12 DIAGNOSIS — H1012 Acute atopic conjunctivitis, left eye: Secondary | ICD-10-CM | POA: Diagnosis not present

## 2018-07-12 DIAGNOSIS — Z6834 Body mass index (BMI) 34.0-34.9, adult: Secondary | ICD-10-CM | POA: Diagnosis not present

## 2018-07-16 ENCOUNTER — Other Ambulatory Visit: Payer: Self-pay | Admitting: Family Medicine

## 2018-07-24 DIAGNOSIS — M25561 Pain in right knee: Secondary | ICD-10-CM | POA: Diagnosis not present

## 2018-07-24 DIAGNOSIS — M25562 Pain in left knee: Secondary | ICD-10-CM | POA: Diagnosis not present

## 2018-07-24 DIAGNOSIS — G894 Chronic pain syndrome: Secondary | ICD-10-CM | POA: Diagnosis not present

## 2018-07-24 DIAGNOSIS — G8929 Other chronic pain: Secondary | ICD-10-CM | POA: Diagnosis not present

## 2018-08-07 ENCOUNTER — Telehealth: Payer: Self-pay | Admitting: General Practice

## 2018-08-07 NOTE — Telephone Encounter (Signed)
PA began in covermymeds for ventolin  Key: [id number] - PA Case ID: [id number] - Rx #: [id number]

## 2018-08-14 MED ORDER — ALBUTEROL SULFATE HFA 108 (90 BASE) MCG/ACT IN AERS: 2.0000 | INHALATION_SPRAY | Freq: Four times a day (QID) | RESPIRATORY_TRACT | 3 refills | Status: DC | PRN

## 2018-08-14 NOTE — Telephone Encounter (Signed)
PA denied, pt was switched to albuterol HFA. New med filled to pharmacy.

## 2018-08-14 NOTE — Addendum Note (Signed)
Addended by: Davis Gourd on: 08/14/2018 03:54 PM   Modules accepted: Orders

## 2018-08-21 DIAGNOSIS — M25562 Pain in left knee: Secondary | ICD-10-CM | POA: Diagnosis not present

## 2018-08-21 DIAGNOSIS — M25561 Pain in right knee: Secondary | ICD-10-CM | POA: Diagnosis not present

## 2018-08-21 DIAGNOSIS — G894 Chronic pain syndrome: Secondary | ICD-10-CM | POA: Diagnosis not present

## 2018-08-21 DIAGNOSIS — G588 Other specified mononeuropathies: Secondary | ICD-10-CM | POA: Diagnosis not present

## 2018-08-24 ENCOUNTER — Other Ambulatory Visit: Payer: Self-pay | Admitting: Family Medicine

## 2018-08-25 NOTE — Telephone Encounter (Signed)
Last OV 06/24/18 Alprazolam last filled 06/10/18 #60 with 1

## 2018-08-26 ENCOUNTER — Other Ambulatory Visit: Payer: Self-pay | Admitting: Hematology and Oncology

## 2018-08-26 DIAGNOSIS — Z1231 Encounter for screening mammogram for malignant neoplasm of breast: Secondary | ICD-10-CM

## 2018-09-03 ENCOUNTER — Ambulatory Visit (INDEPENDENT_AMBULATORY_CARE_PROVIDER_SITE_OTHER): Payer: Medicare Other | Admitting: Family Medicine

## 2018-09-03 ENCOUNTER — Encounter: Payer: Self-pay | Admitting: Family Medicine

## 2018-09-03 ENCOUNTER — Other Ambulatory Visit (INDEPENDENT_AMBULATORY_CARE_PROVIDER_SITE_OTHER): Payer: Medicare Other

## 2018-09-03 ENCOUNTER — Other Ambulatory Visit: Payer: Self-pay

## 2018-09-03 VITALS — Temp 95.0°F | Wt 210.0 lb

## 2018-09-03 DIAGNOSIS — E119 Type 2 diabetes mellitus without complications: Secondary | ICD-10-CM

## 2018-09-03 LAB — BASIC METABOLIC PANEL
BUN: 13 mg/dL (ref 6–23)
CO2: 33 mEq/L — ABNORMAL HIGH (ref 19–32)
Calcium: 9.6 mg/dL (ref 8.4–10.5)
Chloride: 104 mEq/L (ref 96–112)
Creatinine, Ser: 1.06 mg/dL (ref 0.40–1.20)
GFR: 61.55 mL/min (ref >60.00)
Glucose, Bld: 104 mg/dL — ABNORMAL HIGH (ref 70–99)
Potassium: 4.1 mEq/L (ref 3.5–5.1)
Sodium: 144 mEq/L (ref 135–145)

## 2018-09-03 LAB — HEMOGLOBIN A1C: Hgb A1c MFr Bld: 6.6 % — ABNORMAL HIGH (ref 4.6–6.5)

## 2018-09-03 NOTE — Progress Notes (Signed)
Virtual Visit via Video   I connected with patient on 09/03/18 at 10:20 AM EDT by a video enabled telemedicine application and verified that I am speaking with the correct person using two identifiers.  Location patient: Home Location provider: Acupuncturist, Office Persons participating in the virtual visit: Patient, Provider, [place] (Jess B)  I discussed the limitations of evaluation and management by telemedicine and the availability of in person appointments. The patient expressed understanding and agreed to proceed.  Subjective:   HPI:   DM- chronic problem.  Attempting to control w/ diet and exercise.  Due for foot exam (unable to do today).  UTD on microalbumin, eye exam.  Weight is unchanged.  Pt is able to stay home and stay safe.  Denies CP, SOB, HAs, visual changes, abd pain, N/V, edema.  ROS:   See pertinent positives and negatives per HPI.  Patient Active Problem List   Diagnosis Date Noted  . Chest pain 06/08/2018  . Cervical disc disease 05/02/2017  . Anemia associated with nutritional deficiency 01/13/2016  . Syncope 10/05/2015  . Genetic testing 08/21/2015  . Family history of breast cancer in female 07/15/2015  . Family history of colon cancer 07/15/2015  . Breast cancer of lower-outer quadrant of right female breast ([place]) 07/08/2015  . Disturbance in sleep behavior 11/18/2014  . Fibromyalgia 10/04/2014  . Diverticulitis of colon (without mention of hemorrhage)(562.11) 12/01/2013  . Dry mouth 04/29/2013  . Dry eye 04/29/2013  . Dry skin 04/29/2013  . Obesity (BMI 30-39.9) 03/11/2013  . Edema 09/04/2012  . Depression 09/04/2012  . Chemotherapy-induced peripheral neuropathy ([place]) 05/12/2012  . OAB (overactive bladder) 04/01/2012  . DM w/o complication type II ([place]) 02/06/2012  . Colitis 01/21/2012  . Allergy to dogs 04/19/2011  . Polyarthralgia 09/18/2010  . STRESS INCONTINENCE 04/25/2010  . ASTHMA 04/11/2010  . VERTIGO 04/11/2010  .  Hypothyroidism 01/19/2010  . Hyperlipidemia associated with type 2 diabetes mellitus ([place]) 01/19/2010  . Essential hypertension 01/19/2010  . GERD 01/19/2010  . PEPTIC ULCER DISEASE 01/19/2010  . OSTEOARTHRITIS 01/19/2010  . LOW BACK PAIN 01/19/2010    Social History   Tobacco Use  . Smoking status: Never Smoker  . Smokeless tobacco: Never Used  Substance Use Topics  . Alcohol use: Not Currently    Alcohol/week: 0.0 standard drinks    Comment: 08/15/2015 "1/2 glass of wine q 2 months"     Current Outpatient Medications:  .  albuterol (VENTOLIN HFA) 108 (90 Base) MCG/ACT inhaler, Inhale 2 puffs into the lungs every 6 (six) hours as needed for wheezing or shortness of breath., Disp: 1 Inhaler, Rfl: 3 .  ALPRAZolam (XANAX) 1 MG tablet, TAKE 1 TABLET BY MOUTH TWICE A DAY AS NEEDED, Disp: 60 tablet, Rfl: 1 .  amLODipine (NORVASC) 2.5 MG tablet, TAKE 1 TABLET BY MOUTH EVERY DAY (Patient taking differently: Take 2.5 mg by mouth daily. ), Disp: 90 tablet, Rfl: 1 .  aspirin 81 MG tablet, Take 81 mg by mouth daily., Disp: , Rfl:  .  atorvastatin (LIPITOR) 20 MG tablet, every evening., Disp: , Rfl:  .  buPROPion (WELLBUTRIN XL) 150 MG 24 hr tablet, TAKE 1 TABLET BY MOUTH EVERY DAY, Disp: 90 tablet, Rfl: 2 .  citalopram (CELEXA) 20 MG tablet, Take 1 tablet (20 mg total) by mouth daily., Disp: 90 tablet, Rfl: 1 .  diphenhydrAMINE (BENADRYL) 50 MG tablet, Take one tablet (50mg s total) one hour prior to study., Disp: 1 tablet, Rfl: 0 .  Glycerin-Polysorbate 80 (  REFRESH DRY EYE THERAPY OP), Place 1 drop into both eyes daily as needed (dry eyes)., Disp: , Rfl:  .  letrozole (FEMARA) 2.5 MG tablet, Take 1 tablet (2.5 mg total) by mouth daily., Disp: 90 tablet, Rfl: 3 .  levothyroxine (SYNTHROID, LEVOTHROID) 75 MCG tablet, TAKE 1 TABLET BY MOUTH EVERY DAY (Patient taking differently: Take 75 mcg by mouth daily before breakfast. ), Disp: 90 tablet, Rfl: 1 .  meloxicam (MOBIC) 15 MG tablet, TAKE 1 TABLET BY  MOUTH EVERY DAY (Patient taking differently: Take 15 mg by mouth daily. ), Disp: 30 tablet, Rfl: 1 .  methocarbamol (ROBAXIN) 500 MG tablet, Take 500 mg by mouth every 6 (six) hours as needed for muscle spasms. , Disp: , Rfl: 1 .  metoprolol succinate (TOPROL-XL) 25 MG 24 hr tablet, , Disp: , Rfl:  .  metoprolol tartrate (LOPRESSOR) 25 MG tablet, Take 0.5 tablets (12.5 mg total) by mouth 2 (two) times daily., Disp: 60 tablet, Rfl: 0 .  nitroGLYCERIN (NITROSTAT) 0.4 MG SL tablet, Place 1 tablet (0.4 mg total) under the tongue every 5 (five) minutes as needed. As needed for chest pain (Patient taking differently: Place 0.4 mg under the tongue every 5 (five) minutes as needed for chest pain. ), Disp: 30 tablet, Rfl: 3 .  pantoprazole (PROTONIX) 40 MG tablet, , Disp: , Rfl:  .  Polyethyl Glycol-Propyl Glycol (SYSTANE ULTRA) 0.4-0.3 % SOLN, Place 1 drop into the right eye 3 (three) times daily as needed (for dry eyes). , Disp: , Rfl:  .  tiZANidine (ZANAFLEX) 4 MG tablet, Take 4 mg by mouth every 8 (eight) hours as needed for muscle spasms. , Disp: , Rfl: 0  Allergies  Allergen Reactions  . Contrast Media [Iodinated Diagnostic Agents] Shortness Of Breath    Patient has SOB, tightness in chest and feels like an elephant is sitting on chest, patient should be  scanned at hospital Per Dr. Alvester Chou  . Dilaudid [Hydromorphone Hcl] Anaphylaxis    Pt stopped breathing  . Adhesive [Tape]     blister  . Codeine Nausea Only    insomnia  . Lactose Intolerance (Gi) Diarrhea    Objective:   Temp (!) 95 F (35 C) (Skin)   Wt 210 lb (95.3 kg)   BMI 34.95 kg/m   AAOx3, NAD NCAT, EOMI No obvious CN deficits Coloring WNL Pt is able to speak clearly, coherently without shortness of breath or increased work of breathing.  Thought process is linear.  Mood is appropriate.   Assessment and Plan:   DM- chronic problem.  History of adequate control w/ diet.  UTD on eye exam, microalbumin.  No way to do foot  exam today.  Encouraged healthy diet and exercise as able.  Check labs.  Adjust meds prn     Annye Asa, MD 09/03/2018

## 2018-09-03 NOTE — Progress Notes (Signed)
I have discussed the procedure for the virtual visit with the patient who has given consent to proceed with assessment and treatment.   Jessica L Brodmerkel, CMA     

## 2018-09-04 ENCOUNTER — Encounter: Payer: Self-pay | Admitting: General Practice

## 2018-09-17 ENCOUNTER — Telehealth: Payer: Self-pay | Admitting: *Deleted

## 2018-09-17 DIAGNOSIS — E039 Hypothyroidism, unspecified: Secondary | ICD-10-CM | POA: Diagnosis not present

## 2018-09-17 DIAGNOSIS — M1990 Unspecified osteoarthritis, unspecified site: Secondary | ICD-10-CM | POA: Diagnosis not present

## 2018-09-17 DIAGNOSIS — I2510 Atherosclerotic heart disease of native coronary artery without angina pectoris: Secondary | ICD-10-CM | POA: Diagnosis not present

## 2018-09-17 DIAGNOSIS — M797 Fibromyalgia: Secondary | ICD-10-CM | POA: Diagnosis not present

## 2018-09-17 DIAGNOSIS — E785 Hyperlipidemia, unspecified: Secondary | ICD-10-CM | POA: Diagnosis not present

## 2018-09-17 DIAGNOSIS — I10 Essential (primary) hypertension: Secondary | ICD-10-CM | POA: Diagnosis not present

## 2018-09-17 NOTE — Telephone Encounter (Signed)
Received VM from pt, attempt x1 to return call. LVM

## 2018-09-18 ENCOUNTER — Telehealth: Payer: Self-pay | Admitting: *Deleted

## 2018-09-18 NOTE — Telephone Encounter (Signed)
Second attempt to reach pt regarding VM left on 09/17/2018.  LVM to return call.

## 2018-09-24 DIAGNOSIS — G588 Other specified mononeuropathies: Secondary | ICD-10-CM | POA: Diagnosis not present

## 2018-09-24 DIAGNOSIS — M25562 Pain in left knee: Secondary | ICD-10-CM | POA: Diagnosis not present

## 2018-09-24 DIAGNOSIS — G894 Chronic pain syndrome: Secondary | ICD-10-CM | POA: Diagnosis not present

## 2018-09-24 DIAGNOSIS — G8929 Other chronic pain: Secondary | ICD-10-CM | POA: Diagnosis not present

## 2018-09-24 DIAGNOSIS — M25561 Pain in right knee: Secondary | ICD-10-CM | POA: Diagnosis not present

## 2018-09-30 ENCOUNTER — Other Ambulatory Visit: Payer: Self-pay | Admitting: Family Medicine

## 2018-09-30 DIAGNOSIS — Z885 Allergy status to narcotic agent status: Secondary | ICD-10-CM | POA: Diagnosis not present

## 2018-09-30 DIAGNOSIS — E039 Hypothyroidism, unspecified: Secondary | ICD-10-CM | POA: Diagnosis not present

## 2018-09-30 DIAGNOSIS — M1711 Unilateral primary osteoarthritis, right knee: Secondary | ICD-10-CM | POA: Diagnosis not present

## 2018-09-30 DIAGNOSIS — F419 Anxiety disorder, unspecified: Secondary | ICD-10-CM | POA: Diagnosis not present

## 2018-09-30 DIAGNOSIS — J45909 Unspecified asthma, uncomplicated: Secondary | ICD-10-CM | POA: Diagnosis not present

## 2018-09-30 DIAGNOSIS — Z91048 Other nonmedicinal substance allergy status: Secondary | ICD-10-CM | POA: Diagnosis not present

## 2018-09-30 DIAGNOSIS — Z91041 Radiographic dye allergy status: Secondary | ICD-10-CM | POA: Diagnosis not present

## 2018-09-30 DIAGNOSIS — F329 Major depressive disorder, single episode, unspecified: Secondary | ICD-10-CM | POA: Diagnosis not present

## 2018-09-30 DIAGNOSIS — M25561 Pain in right knee: Secondary | ICD-10-CM | POA: Diagnosis not present

## 2018-09-30 DIAGNOSIS — I10 Essential (primary) hypertension: Secondary | ICD-10-CM | POA: Diagnosis not present

## 2018-10-02 ENCOUNTER — Other Ambulatory Visit: Payer: Self-pay | Admitting: Hematology and Oncology

## 2018-10-05 ENCOUNTER — Encounter (HOSPITAL_COMMUNITY): Payer: Self-pay | Admitting: Emergency Medicine

## 2018-10-05 ENCOUNTER — Other Ambulatory Visit: Payer: Self-pay

## 2018-10-05 ENCOUNTER — Emergency Department (HOSPITAL_COMMUNITY): Payer: Medicare Other

## 2018-10-05 ENCOUNTER — Observation Stay (HOSPITAL_COMMUNITY)
Admission: EM | Admit: 2018-10-05 | Discharge: 2018-10-06 | Disposition: A | Discharge status: Home Health | Payer: Medicare Other | Attending: Internal Medicine | Admitting: Internal Medicine

## 2018-10-05 DIAGNOSIS — J449 Chronic obstructive pulmonary disease, unspecified: Secondary | ICD-10-CM | POA: Insufficient documentation

## 2018-10-05 DIAGNOSIS — E785 Hyperlipidemia, unspecified: Secondary | ICD-10-CM | POA: Insufficient documentation

## 2018-10-05 DIAGNOSIS — Z6830 Body mass index (BMI) 30.0-30.9, adult: Secondary | ICD-10-CM | POA: Diagnosis not present

## 2018-10-05 DIAGNOSIS — Z791 Long term (current) use of non-steroidal anti-inflammatories (NSAID): Secondary | ICD-10-CM | POA: Insufficient documentation

## 2018-10-05 DIAGNOSIS — R1032 Left lower quadrant pain: Secondary | ICD-10-CM | POA: Diagnosis not present

## 2018-10-05 DIAGNOSIS — E669 Obesity, unspecified: Secondary | ICD-10-CM | POA: Insufficient documentation

## 2018-10-05 DIAGNOSIS — Z853 Personal history of malignant neoplasm of breast: Secondary | ICD-10-CM | POA: Diagnosis not present

## 2018-10-05 DIAGNOSIS — Z79890 Hormone replacement therapy (postmenopausal): Secondary | ICD-10-CM | POA: Insufficient documentation

## 2018-10-05 DIAGNOSIS — Z803 Family history of malignant neoplasm of breast: Secondary | ICD-10-CM | POA: Insufficient documentation

## 2018-10-05 DIAGNOSIS — E1142 Type 2 diabetes mellitus with diabetic polyneuropathy: Secondary | ICD-10-CM | POA: Insufficient documentation

## 2018-10-05 DIAGNOSIS — K5730 Diverticulosis of large intestine without perforation or abscess without bleeding: Secondary | ICD-10-CM | POA: Diagnosis not present

## 2018-10-05 DIAGNOSIS — E039 Hypothyroidism, unspecified: Secondary | ICD-10-CM | POA: Diagnosis not present

## 2018-10-05 DIAGNOSIS — M1990 Unspecified osteoarthritis, unspecified site: Secondary | ICD-10-CM | POA: Insufficient documentation

## 2018-10-05 DIAGNOSIS — E119 Type 2 diabetes mellitus without complications: Secondary | ICD-10-CM

## 2018-10-05 DIAGNOSIS — Z9011 Acquired absence of right breast and nipple: Secondary | ICD-10-CM | POA: Insufficient documentation

## 2018-10-05 DIAGNOSIS — I10 Essential (primary) hypertension: Secondary | ICD-10-CM | POA: Diagnosis not present

## 2018-10-05 DIAGNOSIS — M6281 Muscle weakness (generalized): Secondary | ICD-10-CM | POA: Insufficient documentation

## 2018-10-05 DIAGNOSIS — Z9221 Personal history of antineoplastic chemotherapy: Secondary | ICD-10-CM | POA: Insufficient documentation

## 2018-10-05 DIAGNOSIS — K219 Gastro-esophageal reflux disease without esophagitis: Secondary | ICD-10-CM | POA: Diagnosis present

## 2018-10-05 DIAGNOSIS — N390 Urinary tract infection, site not specified: Secondary | ICD-10-CM | POA: Diagnosis present

## 2018-10-05 DIAGNOSIS — F329 Major depressive disorder, single episode, unspecified: Secondary | ICD-10-CM | POA: Insufficient documentation

## 2018-10-05 DIAGNOSIS — Z03818 Encounter for observation for suspected exposure to other biological agents ruled out: Secondary | ICD-10-CM | POA: Diagnosis not present

## 2018-10-05 DIAGNOSIS — R296 Repeated falls: Secondary | ICD-10-CM | POA: Diagnosis not present

## 2018-10-05 DIAGNOSIS — R2689 Other abnormalities of gait and mobility: Secondary | ICD-10-CM | POA: Insufficient documentation

## 2018-10-05 DIAGNOSIS — Z79899 Other long term (current) drug therapy: Secondary | ICD-10-CM | POA: Insufficient documentation

## 2018-10-05 DIAGNOSIS — R52 Pain, unspecified: Secondary | ICD-10-CM | POA: Diagnosis not present

## 2018-10-05 DIAGNOSIS — Z7982 Long term (current) use of aspirin: Secondary | ICD-10-CM | POA: Insufficient documentation

## 2018-10-05 DIAGNOSIS — Z90710 Acquired absence of both cervix and uterus: Secondary | ICD-10-CM | POA: Insufficient documentation

## 2018-10-05 DIAGNOSIS — R031 Nonspecific low blood-pressure reading: Secondary | ICD-10-CM

## 2018-10-05 DIAGNOSIS — Z1159 Encounter for screening for other viral diseases: Secondary | ICD-10-CM | POA: Insufficient documentation

## 2018-10-05 DIAGNOSIS — N179 Acute kidney failure, unspecified: Secondary | ICD-10-CM

## 2018-10-05 DIAGNOSIS — R5381 Other malaise: Secondary | ICD-10-CM | POA: Diagnosis not present

## 2018-10-05 DIAGNOSIS — Z66 Do not resuscitate: Secondary | ICD-10-CM | POA: Diagnosis not present

## 2018-10-05 DIAGNOSIS — M62838 Other muscle spasm: Secondary | ICD-10-CM | POA: Insufficient documentation

## 2018-10-05 DIAGNOSIS — T300 Burn of unspecified body region, unspecified degree: Secondary | ICD-10-CM | POA: Diagnosis not present

## 2018-10-05 DIAGNOSIS — Z9181 History of falling: Secondary | ICD-10-CM | POA: Insufficient documentation

## 2018-10-05 DIAGNOSIS — M797 Fibromyalgia: Secondary | ICD-10-CM | POA: Diagnosis not present

## 2018-10-05 DIAGNOSIS — I959 Hypotension, unspecified: Secondary | ICD-10-CM

## 2018-10-05 DIAGNOSIS — F419 Anxiety disorder, unspecified: Secondary | ICD-10-CM | POA: Insufficient documentation

## 2018-10-05 DIAGNOSIS — R1084 Generalized abdominal pain: Secondary | ICD-10-CM | POA: Diagnosis not present

## 2018-10-05 DIAGNOSIS — Z885 Allergy status to narcotic agent status: Secondary | ICD-10-CM | POA: Insufficient documentation

## 2018-10-05 DIAGNOSIS — Z888 Allergy status to other drugs, medicaments and biological substances status: Secondary | ICD-10-CM | POA: Insufficient documentation

## 2018-10-05 LAB — MAGNESIUM: Magnesium: 2.1 mg/dL (ref 1.7–2.4)

## 2018-10-05 LAB — COMPREHENSIVE METABOLIC PANEL
ALT: 22 U/L (ref 0–44)
AST: 27 U/L (ref 15–41)
Albumin: 4 g/dL (ref 3.5–5.0)
Alkaline Phosphatase: 102 U/L (ref 38–126)
Anion gap: 11 (ref 5–15)
BUN: 30 mg/dL — ABNORMAL HIGH (ref 8–23)
CO2: 24 mmol/L (ref 22–32)
Calcium: 9.2 mg/dL (ref 8.9–10.3)
Chloride: 102 mmol/L (ref 98–111)
Creatinine, Ser: 1.45 mg/dL — ABNORMAL HIGH (ref 0.44–1.00)
GFR calc Af Amer: 42 mL/min — ABNORMAL LOW (ref >60)
GFR calc non Af Amer: 36 mL/min — ABNORMAL LOW (ref >60)
Glucose, Bld: 120 mg/dL — ABNORMAL HIGH (ref 70–99)
Potassium: 4.6 mmol/L (ref 3.5–5.1)
Sodium: 137 mmol/L (ref 135–145)
Total Bilirubin: 1.3 mg/dL — ABNORMAL HIGH (ref 0.3–1.2)
Total Protein: 7.4 g/dL (ref 6.5–8.1)

## 2018-10-05 LAB — CBC WITH DIFFERENTIAL/PLATELET
Abs Immature Granulocytes: 0.04 10*3/uL (ref 0.00–0.07)
Basophils Absolute: 0 10*3/uL (ref 0.0–0.1)
Basophils Relative: 0 %
Eosinophils Absolute: 0 10*3/uL (ref 0.0–0.5)
Eosinophils Relative: 0 %
HCT: 37.3 % (ref 36.0–46.0)
Hemoglobin: 11.8 g/dL — ABNORMAL LOW (ref 12.0–15.0)
Immature Granulocytes: 0 %
Lymphocytes Relative: 16 %
Lymphs Abs: 1.8 10*3/uL (ref 0.7–4.0)
MCH: 29.9 pg (ref 26.0–34.0)
MCHC: 31.6 g/dL (ref 30.0–36.0)
MCV: 94.7 fL (ref 80.0–100.0)
Monocytes Absolute: 1.3 10*3/uL — ABNORMAL HIGH (ref 0.1–1.0)
Monocytes Relative: 11 %
Neutro Abs: 8.2 10*3/uL — ABNORMAL HIGH (ref 1.7–7.7)
Neutrophils Relative %: 73 %
Platelets: 283 10*3/uL (ref 150–400)
RBC: 3.94 MIL/uL (ref 3.87–5.11)
RDW: 13 % (ref 11.5–15.5)
WBC: 11.4 10*3/uL — ABNORMAL HIGH (ref 4.0–10.5)
nRBC: 0 % (ref 0.0–0.2)

## 2018-10-05 LAB — URINALYSIS, ROUTINE W REFLEX MICROSCOPIC
Bilirubin Urine: NEGATIVE
Glucose, UA: NEGATIVE mg/dL
Ketones, ur: NEGATIVE mg/dL
Nitrite: NEGATIVE
Protein, ur: NEGATIVE mg/dL
Specific Gravity, Urine: 1.025 (ref 1.005–1.030)
WBC, UA: >50 WBC/hpf — ABNORMAL HIGH (ref 0–5)
pH: 5 (ref 5.0–8.0)

## 2018-10-05 LAB — GLUCOSE, CAPILLARY
Glucose-Capillary: 130 mg/dL — ABNORMAL HIGH (ref 70–99)
Glucose-Capillary: 121 mg/dL — ABNORMAL HIGH (ref 70–99)

## 2018-10-05 LAB — SARS CORONAVIRUS 2 BY RT PCR (HOSPITAL ORDER, PERFORMED IN ~~LOC~~ HOSPITAL LAB): SARS Coronavirus 2: NEGATIVE

## 2018-10-05 LAB — LACTIC ACID, PLASMA: Lactic Acid, Venous: 0.9 mmol/L (ref 0.5–1.9)

## 2018-10-05 MED ORDER — INSULIN ASPART 100 UNIT/ML ~~LOC~~ SOLN: 0.0000 [IU] | Freq: Three times a day (TID) | SUBCUTANEOUS | Status: DC

## 2018-10-05 MED ORDER — CEPHALEXIN 500 MG PO CAPS: 500.0000 mg | ORAL_CAPSULE | Freq: Four times a day (QID) | ORAL | 0 refills | Status: DC

## 2018-10-05 MED ORDER — CITALOPRAM HYDROBROMIDE 20 MG PO TABS
20.0000 mg | ORAL_TABLET | Freq: Every day | ORAL | Status: DC
  Administered 2018-10-06: 20 mg via ORAL
  Filled 2018-10-05: qty 1

## 2018-10-05 MED ORDER — ACETAMINOPHEN 650 MG RE SUPP: 650.0000 mg | Freq: Four times a day (QID) | RECTAL | Status: DC | PRN

## 2018-10-05 MED ORDER — SODIUM CHLORIDE 0.9 % IV BOLUS
500.0000 mL | Freq: Once | INTRAVENOUS | Status: AC
  Administered 2018-10-05: 500 mL via INTRAVENOUS

## 2018-10-05 MED ORDER — HYDROCODONE-ACETAMINOPHEN 5-325 MG PO TABS
1.0000 | ORAL_TABLET | ORAL | Status: DC | PRN
  Administered 2018-10-05: 1 via ORAL
  Filled 2018-10-05: qty 1

## 2018-10-05 MED ORDER — ENOXAPARIN SODIUM 40 MG/0.4ML ~~LOC~~ SOLN
40.0000 mg | SUBCUTANEOUS | Status: DC
  Administered 2018-10-05: 40 mg via SUBCUTANEOUS
  Filled 2018-10-05: qty 0.4

## 2018-10-05 MED ORDER — DOCUSATE SODIUM 100 MG PO CAPS
100.0000 mg | ORAL_CAPSULE | Freq: Two times a day (BID) | ORAL | Status: DC
  Administered 2018-10-05 – 2018-10-06 (×2): 100 mg via ORAL
  Filled 2018-10-05 (×2): qty 1

## 2018-10-05 MED ORDER — LETROZOLE 2.5 MG PO TABS
2.5000 mg | ORAL_TABLET | Freq: Every day | ORAL | Status: DC
  Administered 2018-10-06: 2.5 mg via ORAL
  Filled 2018-10-05: qty 1

## 2018-10-05 MED ORDER — ACETAMINOPHEN 325 MG PO TABS
650.0000 mg | ORAL_TABLET | Freq: Four times a day (QID) | ORAL | Status: DC | PRN
  Administered 2018-10-06: 650 mg via ORAL
  Filled 2018-10-05: qty 2

## 2018-10-05 MED ORDER — ALBUTEROL SULFATE (2.5 MG/3ML) 0.083% IN NEBU: 2.5000 mg | INHALATION_SOLUTION | Freq: Four times a day (QID) | RESPIRATORY_TRACT | Status: DC | PRN

## 2018-10-05 MED ORDER — SODIUM CHLORIDE 0.9 % IV SOLN
INTRAVENOUS | Status: DC
  Administered 2018-10-05: 18:00:00 via INTRAVENOUS

## 2018-10-05 MED ORDER — SODIUM CHLORIDE 0.9 % IV BOLUS
1000.0000 mL | Freq: Once | INTRAVENOUS | Status: AC
  Administered 2018-10-05: 1000 mL via INTRAVENOUS

## 2018-10-05 MED ORDER — SODIUM CHLORIDE 0.9 % IV SOLN
1.0000 g | INTRAVENOUS | Status: DC
  Filled 2018-10-05: qty 10

## 2018-10-05 MED ORDER — MORPHINE SULFATE (PF) 4 MG/ML IV SOLN
4.0000 mg | Freq: Once | INTRAVENOUS | Status: AC
  Administered 2018-10-05: 4 mg via INTRAVENOUS
  Filled 2018-10-05: qty 1

## 2018-10-05 MED ORDER — SODIUM CHLORIDE 0.9 % IV SOLN
1.0000 g | Freq: Once | INTRAVENOUS | Status: AC
  Administered 2018-10-05: 1 g via INTRAVENOUS
  Filled 2018-10-05: qty 10

## 2018-10-05 MED ORDER — INSULIN ASPART 100 UNIT/ML ~~LOC~~ SOLN: 0.0000 [IU] | Freq: Every day | SUBCUTANEOUS | Status: DC

## 2018-10-05 MED ORDER — BUPROPION HCL ER (XL) 150 MG PO TB24
150.0000 mg | ORAL_TABLET | Freq: Every day | ORAL | Status: DC
  Administered 2018-10-06: 150 mg via ORAL
  Filled 2018-10-05: qty 1

## 2018-10-05 MED ORDER — METHOCARBAMOL 500 MG PO TABS: 500.0000 mg | ORAL_TABLET | Freq: Four times a day (QID) | ORAL | Status: DC | PRN

## 2018-10-05 MED ORDER — ATORVASTATIN CALCIUM 20 MG PO TABS
20.0000 mg | ORAL_TABLET | Freq: Every evening | ORAL | Status: DC
  Administered 2018-10-05: 20 mg via ORAL
  Filled 2018-10-05: qty 1

## 2018-10-05 MED ORDER — LEVOTHYROXINE SODIUM 50 MCG PO TABS
75.0000 ug | ORAL_TABLET | Freq: Every day | ORAL | Status: DC
  Administered 2018-10-06: 75 ug via ORAL
  Filled 2018-10-05: qty 1

## 2018-10-05 MED ORDER — ONDANSETRON HCL 4 MG/2ML IJ SOLN
4.0000 mg | Freq: Once | INTRAMUSCULAR | Status: AC
  Administered 2018-10-05: 4 mg via INTRAVENOUS
  Filled 2018-10-05: qty 2

## 2018-10-05 MED ORDER — SODIUM CHLORIDE 0.9 % IV SOLN
INTRAVENOUS | Status: DC
  Administered 2018-10-05: 14:00:00 via INTRAVENOUS

## 2018-10-05 MED ORDER — ASPIRIN EC 81 MG PO TBEC
81.0000 mg | DELAYED_RELEASE_TABLET | Freq: Every day | ORAL | Status: DC
  Administered 2018-10-06: 81 mg via ORAL
  Filled 2018-10-05: qty 1

## 2018-10-05 NOTE — ED Notes (Signed)
Hospitalist at bedside 

## 2018-10-05 NOTE — ED Provider Notes (Addendum)
[facility] DEPT Provider Note   CSN: [id number] Arrival date & time: 10/05/18  1124     History   Chief Complaint Chief Complaint  Patient presents with   Abdominal Pain    HPI Tracey Evans is a 73 y.o. female.     Patient c/o LLQ abd pain, acute onset this AM. Hx diverticula. No hx kidney stones. Pain acute onset, at rest, radiates towards left groin, constant, mod-severe, without specific exacerbating or alleviating factors. No dysuria or hematuria. Having normal bms. No fever or chills. No vomiting. No abd distension. Denies radicular pain. No numbness or weakness. Pt does note increased stress related to planning a funeral for one of her sisters - indicating she is insure whether that is related.   The history is provided by the patient and the EMS personnel.  Abdominal Pain Associated symptoms: no chest pain, no chills, no cough, no diarrhea, no dysuria, no fever, no hematuria, no shortness of breath, no sore throat, no vaginal bleeding, no vaginal discharge and no vomiting     Past Medical History:  Diagnosis Date   Angina at rest [facility])    "occurs whenever it wants to, but worse during agitation"   Anxiety    Asthma    Binge eating disorder    Breast cancer of lower-outer quadrant of left female breast ([place]) 07/08/2015   "NEVER HAD LEFT BREAST CANCER" (08/15/2015)   Cancer of right breast ([place]) 08/15/2015   Chronic bronchitis (H[facility])    Chronic lower back pain    Complication of anesthesia    had bronc spasms during intubation surgery 2009 on foot-need albuterol inhaler or neb tx preop   Costochondritis    Depression    Diverticular disease    Fibromyalgia    GERD (gastroesophageal reflux disease)    Hot flashes    Hyperlipidemia    Hypertension    Hypothyroidism    Osteoarthritis    "all over"   Peptic ulcer disease    Personal history of chemotherapy    Shortness of breath dyspnea    Type II diabetes  mellitus ([place])    "diet controlled" (08/15/2015)    Patient Active Problem List   Diagnosis Date Noted   Cervical disc disease 05/02/2017   Anemia associated with nutritional deficiency 01/13/2016   Syncope 10/05/2015   Genetic testing 08/21/2015   Family history of breast cancer in female 07/15/2015   Family history of colon cancer 07/15/2015   Breast cancer of lower-outer quadrant of right female breast ([place]) 07/08/2015   Disturbance in sleep behavior 11/18/2014   Fibromyalgia 10/04/2014   Diverticulitis of colon (without mention of hemorrhage)(562.11) 12/01/2013   Dry mouth 04/29/2013   Dry eye 04/29/2013   Obesity (BMI 30-39.9) 03/11/2013   Edema 09/04/2012   Depression 09/04/2012   Chemotherapy-induced peripheral neuropathy ([place]) 05/12/2012   OAB (overactive bladder) 64/40/3474   DM w/o complication type II ([place]) 02/06/2012   Allergy to dogs 04/19/2011   Polyarthralgia 09/18/2010   STRESS INCONTINENCE 04/25/2010   ASTHMA 04/11/2010   VERTIGO 04/11/2010   Hypothyroidism 01/19/2010   Hyperlipidemia associated with type 2 diabetes mellitus ([place]) 01/19/2010   Essential hypertension 01/19/2010   GERD 01/19/2010   PEPTIC ULCER DISEASE 01/19/2010   OSTEOARTHRITIS 01/19/2010   LOW BACK PAIN 01/19/2010    Past Surgical History:  Procedure Laterality Date   ABDOMINAL HYSTERECTOMY     "left my ovaries"   ANKLE ARTHROSCOPY  01/02/2012   Procedure: ANKLE ARTHROSCOPY;  Surgeon: Colin Rhein, MD;  Location: [facility];  Service: Orthopedics;  Laterality: Right;  right ankle arthroscopy with extensive debridement , dridement and drilling talar dome osteochondral lesion   ANKLE FRACTURE SURGERY Right 2009   Dr. Rolena Infante   BREAST BIOPSY  07/2015   CARDIAC CATHETERIZATION     CARPAL TUNNEL RELEASE Bilateral    CATARACT EXTRACTION, BILATERAL     COLONOSCOPY     DILATION AND CURETTAGE OF UTERUS     "before hysterectomy"    FRACTURE SURGERY     KNEE ARTHROSCOPY Bilateral    LAPAROSCOPIC CHOLECYSTECTOMY     MASTECTOMY COMPLETE / SIMPLE W/ SENTINEL NODE BIOPSY Right 08/15/2015   MASTECTOMY W/ SENTINEL NODE BIOPSY Right 08/15/2015   Procedure: TOTAL MASTECTOMY WITH SENTINEL LYMPH NODE BIOPSY AND BLUE DYE INJECTION ;  Surgeon: Fanny Skates, MD;  Location: [facility];  Service: General;  Laterality: Right;   PORT-A-CATH REMOVAL N/A 11/12/2016   Procedure: REMOVAL PORT-A-CATH;  Surgeon: Jackolyn Confer, MD;  Location: [facility];  Service: General;  Laterality: N/A;   PORTACATH PLACEMENT Left 08/15/2015   PORTACATH PLACEMENT Left 08/15/2015   Procedure: INSERTION PORT-A-CATH ;  Surgeon: Fanny Skates, MD;  Location: [facility];  Service: General;  Laterality: Left;   SHOULDER ARTHROSCOPY Bilateral 2009-2011   "bone spurs"   TONSILLECTOMY       OB History    Gravida  2   Para  2   Term      Preterm      AB      Living  3     SAB      TAB      Ectopic      Multiple  1   Live Births               Home Medications    Prior to Admission medications   Medication Sig Start Date End Date Taking? Authorizing Provider  albuterol (VENTOLIN HFA) 108 (90 Base) MCG/ACT inhaler Inhale 2 puffs into the lungs every 6 (six) hours as needed for wheezing or shortness of breath. 08/14/18   Midge Minium, MD  ALPRAZolam Duanne Moron) 1 MG tablet TAKE 1 TABLET BY MOUTH TWICE A DAY AS NEEDED 08/26/18   Midge Minium, MD  amLODipine (NORVASC) 2.5 MG tablet TAKE 1 TABLET BY MOUTH EVERY DAY 09/30/18   Midge Minium, MD  aspirin 81 MG tablet Take 81 mg by mouth daily.    [provider]  atorvastatin (LIPITOR) 20 MG tablet every evening. 07/26/18   [provider]  buPROPion (WELLBUTRIN XL) 150 MG 24 hr tablet TAKE 1 TABLET BY MOUTH EVERY DAY 07/16/18   Midge Minium, MD  citalopram (CELEXA) 20 MG tablet Take 1 tablet (20 mg total) by mouth daily. 05/30/18   Midge Minium, MD  diphenhydrAMINE (BENADRYL) 50 MG tablet Take one tablet (50mg s total) one hour prior to study. 07/27/16   Nicholas Lose, MD  Glycerin-Polysorbate 80 (REFRESH DRY EYE THERAPY OP) Place 1 drop into both eyes daily as needed (dry eyes).    [provider]  letrozole (FEMARA) 2.5 MG tablet TAKE 1 TABLET (2.5 MG TOTAL) BY MOUTH DAILY. 10/02/18   Nicholas Lose, MD  levothyroxine (SYNTHROID, LEVOTHROID) 75 MCG tablet TAKE 1 TABLET BY MOUTH EVERY DAY Patient taking differently: Take 75 mcg by mouth daily before breakfast.  06/05/18   Midge Minium, MD  meloxicam (MOBIC) 15 MG tablet TAKE 1 TABLET  BY MOUTH EVERY DAY Patient taking differently: Take 15 mg by mouth daily.  07/05/17   Midge Minium, MD  methocarbamol (ROBAXIN) 500 MG tablet Take 500 mg by mouth every 6 (six) hours as needed for muscle spasms.  06/21/17   [provider]  metoprolol succinate (TOPROL-XL) 25 MG 24 hr tablet  06/20/18   [provider]  metoprolol tartrate (LOPRESSOR) 25 MG tablet Take 0.5 tablets (12.5 mg total) by mouth 2 (two) times daily. 06/09/18 06/09/19  Elgergawy, Silver Huguenin, MD  nitroGLYCERIN (NITROSTAT) 0.4 MG SL tablet Place 1 tablet (0.4 mg total) under the tongue every 5 (five) minutes as needed. As needed for chest pain Patient taking differently: Place 0.4 mg under the tongue every 5 (five) minutes as needed for chest pain.  11/18/14   Midge Minium, MD  pantoprazole (PROTONIX) 40 MG tablet  06/12/18   [provider]  Polyethyl Glycol-Propyl Glycol (SYSTANE ULTRA) 0.4-0.3 % SOLN Place 1 drop into the right eye 3 (three) times daily as needed (for dry eyes).     [provider]  tiZANidine (ZANAFLEX) 4 MG tablet Take 4 mg by mouth every 8 (eight) hours as needed for muscle spasms.  04/05/16   [provider]    Family History Family History  Problem Relation Age of Onset   Dementia Mother    Stroke Mother    Heart Problems Mother    Dementia  Father    Alcohol abuse Father    Colon cancer Father        dx. 49 or younger   Breast cancer Sister 53       inflammatory   Diverticulitis Sister        maternal half-sister; severe - causing partial colectomy   Breast cancer Sister        maternal half-sister; dx. early 68s   Breast cancer Other 55       niece   Breast cancer Other        niece dx. 54s   Alcohol abuse Brother     Social History Social History   Tobacco Use   Smoking status: Never Smoker   Smokeless tobacco: Never Used  Substance Use Topics   Alcohol use: Not Currently    Alcohol/week: 0.0 standard drinks    Comment: 08/15/2015 "1/2 glass of wine q 2 months"    Drug use: No     Allergies   Contrast media [iodinated diagnostic agents], Dilaudid [hydromorphone hcl], Adhesive [tape], Codeine, and Lactose intolerance (gi)   Review of Systems Review of Systems  Constitutional: Negative for chills and fever.  HENT: Negative for sore throat.   Eyes: Negative for redness.  Respiratory: Negative for cough and shortness of breath.   Cardiovascular: Negative for chest pain.  Gastrointestinal: Positive for abdominal pain. Negative for diarrhea and vomiting.  Genitourinary: Negative for dysuria, hematuria, vaginal bleeding and vaginal discharge.  Musculoskeletal: Negative for back pain and neck pain.  Skin: Negative for rash.  Neurological: Negative for weakness, numbness and headaches.  Hematological: Does not bruise/bleed easily.  Psychiatric/Behavioral: Negative for confusion.     Physical Exam Updated Vital Signs BP 101/66 (BP Location: Left Arm)    Pulse 66    Temp 99.9 F (37.7 C) (Oral)    Resp 18    SpO2 97%   Physical Exam Vitals signs and nursing note reviewed.  Constitutional:      Appearance: Normal appearance. She is well-developed.  HENT:  Head: Atraumatic.     Nose: Nose normal.     Mouth/Throat:     Mouth: Mucous membranes are moist.  Eyes:     General: No scleral  icterus.    Conjunctiva/sclera: Conjunctivae normal.  Neck:     Musculoskeletal: Normal range of motion and neck supple. No neck rigidity or muscular tenderness.     Trachea: No tracheal deviation.  Cardiovascular:     Rate and Rhythm: Normal rate and regular rhythm.     Pulses: Normal pulses.     Heart sounds: Normal heart sounds. No murmur. No friction rub. No gallop.   Pulmonary:     Effort: Pulmonary effort is normal. No respiratory distress.     Breath sounds: Normal breath sounds.  Abdominal:     General: Bowel sounds are normal. There is no distension.     Palpations: Abdomen is soft. There is no mass.     Tenderness: There is abdominal tenderness. There is no guarding or rebound.     Hernia: No hernia is present.     Comments: LLQ tenderness.   Genitourinary:    Comments: No cva tenderness.  Musculoskeletal:        General: No swelling.     Right lower leg: No edema.     Left lower leg: No edema.     Comments: Good passive rom left hip and knee without pain. Femoral and distal pulses palp bil.   Skin:    General: Skin is warm and dry.     Findings: No rash.  Neurological:     Mental Status: She is alert.     Comments: Alert, speech normal. Motor/sens grossly intact bil.   Psychiatric:        Mood and Affect: Mood normal.      ED Treatments / Results  Labs (all labs ordered are listed, but only abnormal results are displayed) Results for orders placed or performed during the hospital encounter of 10/05/18  Comprehensive metabolic panel  Result Value Ref Range   Sodium 137 135 - 145 mmol/L   Potassium 4.6 3.5 - 5.1 mmol/L   Chloride 102 98 - 111 mmol/L   CO2 24 22 - 32 mmol/L   Glucose, Bld 120 (H) 70 - 99 mg/dL   BUN 30 (H) 8 - 23 mg/dL   Creatinine, Ser 1.45 (H) 0.44 - 1.00 mg/dL   Calcium 9.2 8.9 - 10.3 mg/dL   Total Protein 7.4 6.5 - 8.1 g/dL   Albumin 4.0 3.5 - 5.0 g/dL   AST 27 15 - 41 U/L   ALT 22 0 - 44 U/L   Alkaline Phosphatase 102 38 - 126 U/L     Total Bilirubin 1.3 (H) 0.3 - 1.2 mg/dL   GFR calc non Af Amer 36 (L) >60 mL/min   GFR calc Af Amer 42 (L) >60 mL/min   Anion gap 11 5 - 15  Urinalysis, Routine w reflex microscopic  Result Value Ref Range   Color, Urine YELLOW YELLOW   APPearance HAZY (A) CLEAR   Specific Gravity, Urine 1.025 1.005 - 1.030   pH 5.0 5.0 - 8.0   Glucose, UA NEGATIVE NEGATIVE mg/dL   Hgb urine dipstick SMALL (A) NEGATIVE   Bilirubin Urine NEGATIVE NEGATIVE   Ketones, ur NEGATIVE NEGATIVE mg/dL   Protein, ur NEGATIVE NEGATIVE mg/dL   Nitrite NEGATIVE NEGATIVE   Leukocytes,Ua LARGE (A) NEGATIVE   RBC / HPF 0-5 0 - 5 RBC/hpf   WBC, UA >50 (H)  0 - 5 WBC/hpf   Bacteria, UA RARE (A) NONE SEEN   Squamous Epithelial / LPF 0-5 0 - 5  CBC with Differential/Platelet  Result Value Ref Range   WBC 11.4 (H) 4.0 - 10.5 K/uL   RBC 3.94 3.87 - 5.11 MIL/uL   Hemoglobin 11.8 (L) 12.0 - 15.0 g/dL   HCT 37.3 36.0 - 46.0 %   MCV 94.7 80.0 - 100.0 fL   MCH 29.9 26.0 - 34.0 pg   MCHC 31.6 30.0 - 36.0 g/dL   RDW 13.0 11.5 - 15.5 %   Platelets 283 150 - 400 K/uL   nRBC 0.0 0.0 - 0.2 %   Neutrophils Relative % 73 %   Neutro Abs 8.2 (H) 1.7 - 7.7 K/uL   Lymphocytes Relative 16 %   Lymphs Abs 1.8 0.7 - 4.0 K/uL   Monocytes Relative 11 %   Monocytes Absolute 1.3 (H) 0.1 - 1.0 K/uL   Eosinophils Relative 0 %   Eosinophils Absolute 0.0 0.0 - 0.5 K/uL   Basophils Relative 0 %   Basophils Absolute 0.0 0.0 - 0.1 K/uL   Immature Granulocytes 0 %   Abs Immature Granulocytes 0.04 0.00 - 0.07 K/uL   Ct Renal Stone Study  Result Date: 10/05/2018 CLINICAL DATA:  Left lower quadrant abdominal pain and groin pain. Burning with urination. EXAM: CT ABDOMEN AND PELVIS WITHOUT CONTRAST TECHNIQUE: Multidetector CT imaging of the abdomen and pelvis was performed following the standard protocol without IV contrast. COMPARISON:  CT scan dated 07/30/2016 FINDINGS: Lower chest: Previous right mastectomy.  Otherwise normal.  Hepatobiliary: No focal liver abnormality is seen. Status post cholecystectomy. No biliary dilatation. Pancreas: Increased fatty replacement of the pancreas. Spleen: Normal in size without focal abnormality. Adrenals/Urinary Tract: Adrenal glands are normal. Right kidney is normal. Stable cysts on the upper pole of the left kidney. No hydronephrosis. Bladder is normal. Stomach/Bowel: Extensive diverticulosis of the left side of the colon. Stomach and small bowel appear normal. Appendix is normal. Vascular/Lymphatic: Aortic atherosclerosis. No enlarged abdominal or pelvic lymph nodes. Reproductive: Status post hysterectomy. No adnexal masses. Other: No abdominal wall hernia or abnormality. No abdominopelvic ascites. Musculoskeletal: No acute abnormalities. Severe facet arthritis at L4-5 bilaterally and L5-S1 on the right. IMPRESSION: No acute abnormalities. Extensive diverticulosis of the left side of the colon. Specifically, no evidence of renal or ureteral calculi. Electronically Signed   By: Lorriane Shire M.D.   On: 10/05/2018 12:43    EKG    Radiology Ct Renal Stone Study  Result Date: 10/05/2018 CLINICAL DATA:  Left lower quadrant abdominal pain and groin pain. Burning with urination. EXAM: CT ABDOMEN AND PELVIS WITHOUT CONTRAST TECHNIQUE: Multidetector CT imaging of the abdomen and pelvis was performed following the standard protocol without IV contrast. COMPARISON:  CT scan dated 07/30/2016 FINDINGS: Lower chest: Previous right mastectomy.  Otherwise normal. Hepatobiliary: No focal liver abnormality is seen. Status post cholecystectomy. No biliary dilatation. Pancreas: Increased fatty replacement of the pancreas. Spleen: Normal in size without focal abnormality. Adrenals/Urinary Tract: Adrenal glands are normal. Right kidney is normal. Stable cysts on the upper pole of the left kidney. No hydronephrosis. Bladder is normal. Stomach/Bowel: Extensive diverticulosis of the left side of the colon. Stomach  and small bowel appear normal. Appendix is normal. Vascular/Lymphatic: Aortic atherosclerosis. No enlarged abdominal or pelvic lymph nodes. Reproductive: Status post hysterectomy. No adnexal masses. Other: No abdominal wall hernia or abnormality. No abdominopelvic ascites. Musculoskeletal: No acute abnormalities. Severe facet arthritis at L4-5 bilaterally and  L5-S1 on the right. IMPRESSION: No acute abnormalities. Extensive diverticulosis of the left side of the colon. Specifically, no evidence of renal or ureteral calculi. Electronically Signed   By: Lorriane Shire M.D.   On: 10/05/2018 12:43    Procedures Procedures (including critical care time)  Medications Ordered in ED Medications  0.9 %  sodium chloride infusion (has no administration in time range)     Initial Impression / Assessment and Plan / ED Course  I have reviewed the triage vital signs and the nursing notes.  Pertinent labs & imaging results that were available during my care of the patient were reviewed by me and considered in my medical decision making (see chart for details).  Iv ns. Morphine iv. zofran iv.   Labs and imaging ordered.   Reviewed nursing notes and prior charts for additional history.   CT reviewed by me - no acute infection.   Labs reviewed by me - chem normal. Sl elev cr. Iv ns bolus.   ua pending.   Pain improved on recheck. abd soft nt. Po fluids.  ua positive for uti. Ucx. Rocephin iv.  Pain is controlled.  On recheck bp is soft 89/50. Given low/soft bp, Myra Rude, will admit to hospitalist service. Additional ivf. Lactate added to labs.       Final Clinical Impressions(s) / ED Diagnoses   Final diagnoses:  None    ED Discharge Orders    None         Lajean Saver, MD 10/05/18 1504

## 2018-10-05 NOTE — ED Notes (Signed)
Patient transported to CT 

## 2018-10-05 NOTE — H&P (Addendum)
History and Physical    Tracey Evans:[id number] DOB: [birth year] DOA: 10/05/2018  PCP: Midge Minium, MD  Patient coming from: Home  I have personally briefly reviewed patient's old medical records in [place]  Chief Complaint: Left lower quadrant abdominal pain  HPI: Tracey Evans is a 73 y.o. female with medical history significant of anxiety, asthma, diverticulosis, depression, hypertension and several other comorbidities as mentioned below presented to ER with a complaint of left lower quadrant abdominal pain.  According to patient, the pain started around 10 PM last night suddenly.  It has been mostly constant but there have been times when the pain disappears especially when she falls asleep.  She describes pain as crampy, nonradiating with no aggravating or relieving factor and it waxes and wanes anywhere from 5 to 10 out of 10.  She also has hot flashes but no other complaints such as fever, chills, chest pain, shortness of breath, any problem with urination or bowel movement.  No sick contacts or any recent travel.  ED Course: Initially upon presentation to the ED, she was hemodynamically stable and afebrile however after 2 to 3 hours of being in the ED, she became slightly hypotensive and developed low-grade fever of 100.3.  She was eventually diagnosed with UTI.  Due to abdominal pain, CT abdomen and pelvis was done which ruled out any acute pathology such as diverticulitis or any renal stone.  CBC showed mild leukocytosis and BMP showed mild AKI.  She was given a dose of Rocephin in hospital service was consulted to admit the patient for further management.  Review of Systems: As per HPI otherwise 10 point review of systems negative.    Past Medical History:  Diagnosis Date  . Angina at rest [facility])    "occurs whenever it wants to, but worse during agitation"  . Anxiety   . Asthma   . Binge eating disorder   . Breast cancer of lower-outer quadrant of  left female breast ([place]) 07/08/2015   "NEVER HAD LEFT BREAST CANCER" (08/15/2015)  . Cancer of right breast ([place]) 08/15/2015  . Chronic bronchitis ([place])   . Chronic lower back pain   . Complication of anesthesia    had bronc spasms during intubation surgery 2009 on foot-need albuterol inhaler or neb tx preop  . Costochondritis   . Depression   . Diverticular disease   . Fibromyalgia   . GERD (gastroesophageal reflux disease)   . Hot flashes   . Hyperlipidemia   . Hypertension   . Hypothyroidism   . Osteoarthritis    "all over"  . Peptic ulcer disease   . Personal history of chemotherapy   . Shortness of breath dyspnea   . Type II diabetes mellitus ([place])    "diet controlled" (08/15/2015)    Past Surgical History:  Procedure Laterality Date  . ABDOMINAL HYSTERECTOMY     "left my ovaries"  . ANKLE ARTHROSCOPY  01/02/2012   Procedure: ANKLE ARTHROSCOPY;  Surgeon: Colin Rhein, MD;  Location: [facility];  Service: Orthopedics;  Laterality: Right;  right ankle arthroscopy with extensive debridement , dridement and drilling talar dome osteochondral lesion  . ANKLE FRACTURE SURGERY Right 2009   Dr. Rolena Infante  . BREAST BIOPSY  07/2015  . CARDIAC CATHETERIZATION    . CARPAL TUNNEL RELEASE Bilateral   . CATARACT EXTRACTION, BILATERAL    . COLONOSCOPY    . DILATION AND CURETTAGE OF UTERUS     "before hysterectomy"  .  FRACTURE SURGERY    . KNEE ARTHROSCOPY Bilateral   . LAPAROSCOPIC CHOLECYSTECTOMY    . MASTECTOMY COMPLETE / SIMPLE W/ SENTINEL NODE BIOPSY Right 08/15/2015  . MASTECTOMY W/ SENTINEL NODE BIOPSY Right 08/15/2015   Procedure: TOTAL MASTECTOMY WITH SENTINEL LYMPH NODE BIOPSY AND BLUE DYE INJECTION ;  Surgeon: Fanny Skates, MD;  Location: [place];  Service: General;  Laterality: Right;  . PORT-A-CATH REMOVAL N/A 11/12/2016   Procedure: REMOVAL PORT-A-CATH;  Surgeon: Jackolyn Confer, MD;  Location: [facility];  Service: General;  Laterality: N/A;  .  PORTACATH PLACEMENT Left 08/15/2015  . PORTACATH PLACEMENT Left 08/15/2015   Procedure: INSERTION PORT-A-CATH ;  Surgeon: Fanny Skates, MD;  Location: [facility];  Service: General;  Laterality: Left;  . SHOULDER ARTHROSCOPY Bilateral 2009-2011   "bone spurs"  . TONSILLECTOMY       reports that she has never smoked. She has never used smokeless tobacco. She reports previous alcohol use. She reports that she does not use drugs.  Allergies  Allergen Reactions  . Contrast Media [Iodinated Diagnostic Agents] Shortness Of Breath    Patient has SOB, tightness in chest and feels like an elephant is sitting on chest, patient should be  scanned at hospital Per Dr. Alvester Chou  . Dilaudid [Hydromorphone Hcl] Anaphylaxis    Pt stopped breathing  . Adhesive [Tape]     blister  . Codeine Nausea Only    insomnia  . Lactose Intolerance (Gi) Diarrhea    Family History  Problem Relation Age of Onset  . Dementia Mother   . Stroke Mother   . Heart Problems Mother   . Dementia Father   . Alcohol abuse Father   . Colon cancer Father        dx. 44 or younger  . Breast cancer Sister 32       inflammatory  . Diverticulitis Sister        maternal half-sister; severe - causing partial colectomy  . Breast cancer Sister        maternal half-sister; dx. early 49s  . Breast cancer Other 29       niece  . Breast cancer Other        niece dx. 55s  . Alcohol abuse Brother     Prior to Admission medications   Medication Sig Start Date End Date Taking? Authorizing Provider  albuterol (VENTOLIN HFA) 108 (90 Base) MCG/ACT inhaler Inhale 2 puffs into the lungs every 6 (six) hours as needed for wheezing or shortness of breath. 08/14/18  Yes Midge Minium, MD  ALPRAZolam Duanne Moron) 1 MG tablet TAKE 1 TABLET BY MOUTH TWICE A DAY AS NEEDED Patient taking differently: Take 1 mg by mouth 2 (two) times daily as needed for anxiety.  08/26/18  Yes Midge Minium, MD  amLODipine (NORVASC) 2.5 MG tablet TAKE 1 TABLET BY  MOUTH EVERY DAY 09/30/18   Midge Minium, MD  aspirin 81 MG tablet Take 81 mg by mouth daily.    [provider]  atorvastatin (LIPITOR) 20 MG tablet every evening. 07/26/18   [provider]  buPROPion (WELLBUTRIN XL) 150 MG 24 hr tablet TAKE 1 TABLET BY MOUTH EVERY DAY 07/16/18   Midge Minium, MD  cephALEXin (KEFLEX) 500 MG capsule Take 1 capsule (500 mg total) by mouth 4 (four) times daily. 10/05/18   Lajean Saver, MD  citalopram (CELEXA) 20 MG tablet Take 1 tablet (20 mg total) by mouth daily. 05/30/18   Midge Minium,  MD  diphenhydrAMINE (BENADRYL) 50 MG tablet Take one tablet (50mg s total) one hour prior to study. 07/27/16   Nicholas Lose, MD  Glycerin-Polysorbate 80 (REFRESH DRY EYE THERAPY OP) Place 1 drop into both eyes daily as needed (dry eyes).    [provider]  letrozole (FEMARA) 2.5 MG tablet TAKE 1 TABLET (2.5 MG TOTAL) BY MOUTH DAILY. 10/02/18   Nicholas Lose, MD  levothyroxine (SYNTHROID, LEVOTHROID) 75 MCG tablet TAKE 1 TABLET BY MOUTH EVERY DAY Patient taking differently: Take 75 mcg by mouth daily before breakfast.  06/05/18   Midge Minium, MD  meloxicam (MOBIC) 15 MG tablet TAKE 1 TABLET BY MOUTH EVERY DAY Patient taking differently: Take 15 mg by mouth daily.  07/05/17   Midge Minium, MD  metoprolol succinate (TOPROL-XL) 25 MG 24 hr tablet  06/20/18   [provider]  metoprolol tartrate (LOPRESSOR) 25 MG tablet Take 0.5 tablets (12.5 mg total) by mouth 2 (two) times daily. 06/09/18 06/09/19  Elgergawy, Silver Huguenin, MD  nitroGLYCERIN (NITROSTAT) 0.4 MG SL tablet Place 1 tablet (0.4 mg total) under the tongue every 5 (five) minutes as needed. As needed for chest pain Patient taking differently: Place 0.4 mg under the tongue every 5 (five) minutes as needed for chest pain.  11/18/14   Midge Minium, MD  pantoprazole (PROTONIX) 40 MG tablet  06/12/18   [provider]  Polyethyl Glycol-Propyl Glycol (SYSTANE ULTRA)  0.4-0.3 % SOLN Place 1 drop into the right eye 3 (three) times daily as needed (for dry eyes).     [provider]  tiZANidine (ZANAFLEX) 4 MG tablet Take 4 mg by mouth every 8 (eight) hours as needed for muscle spasms.  04/05/16   [provider]    Physical Exam: Vitals:   10/05/18 1300 10/05/18 1330 10/05/18 1346 10/05/18 1430  BP: 114/70 103/66  97/60  Pulse: 76 74  70  Resp: (!) 24 (!) 21  20  Temp:   100.3 F (37.9 C)   TempSrc:   Oral   SpO2: 96% 95%  100%    Constitutional: NAD, calm, comfortable Vitals:   10/05/18 1300 10/05/18 1330 10/05/18 1346 10/05/18 1430  BP: 114/70 103/66  97/60  Pulse: 76 74  70  Resp: (!) 24 (!) 21  20  Temp:   100.3 F (37.9 C)   TempSrc:   Oral   SpO2: 96% 95%  100%   Eyes: PERRL, lids and conjunctivae normal ENMT: Mucous membranes are moist. Posterior pharynx clear of any exudate or lesions.Normal dentition.  Neck: normal, supple, no masses, no thyromegaly Respiratory: clear to auscultation bilaterally, no wheezing, no crackles. Normal respiratory effort. No accessory muscle use.  Cardiovascular: Regular rate and rhythm, no murmurs / rubs / gallops. No extremity edema. 2+ pedal pulses. No carotid bruits.  Abdomen: Left lower quadrant tenderness, no masses palpated. No hepatosplenomegaly. Bowel sounds positive.  Musculoskeletal: no clubbing / cyanosis. No joint deformity upper and lower extremities. Good ROM, no contractures. Normal muscle tone.  Skin: no rashes, lesions, ulcers. No induration Neurologic: CN 2-12 grossly intact. Sensation intact, DTR normal. Strength 5/5 in all 4.  Psychiatric: Normal judgment and insight. Alert and oriented x 3. Normal mood.    Labs on Admission: I have personally reviewed following labs and imaging studies  CBC: Recent Labs  Lab 10/05/18 1153  WBC 11.4*  NEUTROABS 8.2*  HGB 11.8*  HCT 37.3  MCV 94.7  PLT 295   Basic Metabolic Panel: Recent Labs  Lab 10/05/18 1153  NA 137   K 4.6  CL 102  CO2 24  GLUCOSE 120*  BUN 30*  CREATININE 1.45*  CALCIUM 9.2   GFR: CrCl cannot be calculated (Unknown ideal weight.). Liver Function Tests: Recent Labs  Lab 10/05/18 1153  AST 27  ALT 22  ALKPHOS 102  BILITOT 1.3*  PROT 7.4  ALBUMIN 4.0   No results for input(s): LIPASE, AMYLASE in the last 168 hours. No results for input(s): AMMONIA in the last 168 hours. Coagulation Profile: No results for input(s): INR, PROTIME in the last 168 hours. Cardiac Enzymes: No results for input(s): CKTOTAL, CKMB, CKMBINDEX, TROPONINI in the last 168 hours. BNP (last 3 results) No results for input(s): PROBNP in the last 8760 hours. HbA1C: No results for input(s): HGBA1C in the last 72 hours. CBG: No results for input(s): GLUCAP in the last 168 hours. Lipid Profile: No results for input(s): CHOL, HDL, LDLCALC, TRIG, CHOLHDL, LDLDIRECT in the last 72 hours. Thyroid Function Tests: No results for input(s): TSH, T4TOTAL, FREET4, T3FREE, THYROIDAB in the last 72 hours. Anemia Panel: No results for input(s): VITAMINB12, FOLATE, FERRITIN, TIBC, IRON, RETICCTPCT in the last 72 hours. Urine analysis:    Component Value Date/Time   COLORURINE YELLOW 10/05/2018 1352   APPEARANCEUR HAZY (A) 10/05/2018 1352   LABSPEC 1.025 10/05/2018 1352   PHURINE 5.0 10/05/2018 1352   GLUCOSEU NEGATIVE 10/05/2018 1352   HGBUR SMALL (A) 10/05/2018 1352   BILIRUBINUR NEGATIVE 10/05/2018 1352   BILIRUBINUR 1+ 07/08/2017 1020   KETONESUR NEGATIVE 10/05/2018 1352   PROTEINUR NEGATIVE 10/05/2018 1352   UROBILINOGEN 0.2 07/08/2017 1020   UROBILINOGEN 0.2 07/18/2014 1133   NITRITE NEGATIVE 10/05/2018 1352   LEUKOCYTESUR LARGE (A) 10/05/2018 1352    Radiological Exams on Admission: Ct Renal Stone Study  Result Date: 10/05/2018 CLINICAL DATA:  Left lower quadrant abdominal pain and groin pain. Burning with urination. EXAM: CT ABDOMEN AND PELVIS WITHOUT CONTRAST TECHNIQUE: Multidetector CT imaging  of the abdomen and pelvis was performed following the standard protocol without IV contrast. COMPARISON:  CT scan dated 07/30/2016 FINDINGS: Lower chest: Previous right mastectomy.  Otherwise normal. Hepatobiliary: No focal liver abnormality is seen. Status post cholecystectomy. No biliary dilatation. Pancreas: Increased fatty replacement of the pancreas. Spleen: Normal in size without focal abnormality. Adrenals/Urinary Tract: Adrenal glands are normal. Right kidney is normal. Stable cysts on the upper pole of the left kidney. No hydronephrosis. Bladder is normal. Stomach/Bowel: Extensive diverticulosis of the left side of the colon. Stomach and small bowel appear normal. Appendix is normal. Vascular/Lymphatic: Aortic atherosclerosis. No enlarged abdominal or pelvic lymph nodes. Reproductive: Status post hysterectomy. No adnexal masses. Other: No abdominal wall hernia or abnormality. No abdominopelvic ascites. Musculoskeletal: No acute abnormalities. Severe facet arthritis at L4-5 bilaterally and L5-S1 on the right. IMPRESSION: No acute abnormalities. Extensive diverticulosis of the left side of the colon. Specifically, no evidence of renal or ureteral calculi. Electronically Signed   By: Lorriane Shire M.D.   On: 10/05/2018 12:43    Assessment/Plan Active Problems:   Hypothyroidism   Essential hypertension   GERD   DM w/o complication type II (HCC)   Obesity (BMI 30-39.9)   UTI (urinary tract infection)   Hypotension   AKI (acute kidney injury) ([place])    UTI: Positive leukoesterase and UA.  Given Rocephin in the ER.  At this point in time, she does not meet criteria for sepsis however now that she is having low blood pressures so she is  at risk of going into sepsis so we will monitor closely.  I will continue her on normal saline at 125 cc/h.  Continue Rocephin and follow urine culture and tailor antibiotics accordingly.  Asthma: Stable.  Resume home inhalers.  Left lower quadrant abdominal pain:  Patient has history of muscle spasms.  She is on tizanidine, Xanax and another muscle relaxant.  I wonder if this is her muscles are.  CT abdomen negative for any acute pathology.  Resume home medications and monitor.  AKI: Likely prerenal.  Hydrate with IV fluid as mentioned below and repeat labs in the morning.  Electrolytes stable.  Hypotension with history of hypertension: Due to hypotension, I will hold her home dose of amlodipine and blood pressure.  Hydrate through IV and monitor closely.  Type 2 diabetes mellitus: Start on SSI.  DVT prophylaxis: Lovenox Code Status: DNR per patient Family Communication: None present at bedside.  Discussed plan of admission with patient in detail. Disposition Plan: Likely home in next 24 to 48 hours. Consults called: None Admission status: Observation   Darliss Cheney MD [facility] Pager [phone]  If 7PM-7AM, please contact night-coverage [email or web address] Password [facility]  10/05/2018, 3:35 PM

## 2018-10-05 NOTE — Discharge Instructions (Addendum)
It was our pleasure to provide your ER care today - we hope that you feel better.  Your lab tests show a urine infection - take antibiotic as prescribed. Drink plenty of fluids.   From today's lab tests, your kidney function test is mildly elevated (creatinine 1.45) - avoid taking any anti-inflammatory type of pain medication such as mobic, ibuprofen, or naprosyn. Drink plenty of fluids. Follow up with primary care doctor in 1 week.   Your ct scan shows no acute process - note was made of degenerative changes in the lower back/hip area.   You may take acetaminophen as need for pain.   Return to ER if worse, new symptoms, fevers, worsening or severe pain, vomiting, other concern.   You were given pain medication in the ER - no driving for the next 4 hours.

## 2018-10-05 NOTE — ED Notes (Signed)
Patient given water. Reports she will urinate after she drinks the water.

## 2018-10-05 NOTE — ED Notes (Signed)
Bed: WA03 Expected date:  Expected time:  Means of arrival:  Comments: Lower abdominal pain

## 2018-10-05 NOTE — ED Notes (Addendum)
Patient O2 82% on RA after administraion of morphine. Placed on 2L [place]. O2 95% on 2L [place].

## 2018-10-05 NOTE — ED Notes (Signed)
Patient placed on pure wick °

## 2018-10-05 NOTE — ED Triage Notes (Signed)
Pt BIB EMS from home. Pt reports LLQ abdominal and upper groin pain x2 hour. Pt denies N/V/D. Pt reports burning with urination.   BP 120/80 RR 76 95% RA RR 18

## 2018-10-06 DIAGNOSIS — E039 Hypothyroidism, unspecified: Secondary | ICD-10-CM

## 2018-10-06 DIAGNOSIS — N390 Urinary tract infection, site not specified: Secondary | ICD-10-CM | POA: Diagnosis not present

## 2018-10-06 DIAGNOSIS — E119 Type 2 diabetes mellitus without complications: Secondary | ICD-10-CM | POA: Diagnosis not present

## 2018-10-06 DIAGNOSIS — N179 Acute kidney failure, unspecified: Secondary | ICD-10-CM | POA: Diagnosis not present

## 2018-10-06 DIAGNOSIS — K219 Gastro-esophageal reflux disease without esophagitis: Secondary | ICD-10-CM | POA: Diagnosis not present

## 2018-10-06 LAB — CBC
HCT: 36.5 % (ref 36.0–46.0)
Hemoglobin: 11.2 g/dL — ABNORMAL LOW (ref 12.0–15.0)
MCH: 29.8 pg (ref 26.0–34.0)
MCHC: 30.7 g/dL (ref 30.0–36.0)
MCV: 97.1 fL (ref 80.0–100.0)
Platelets: 269 10*3/uL (ref 150–400)
RBC: 3.76 MIL/uL — ABNORMAL LOW (ref 3.87–5.11)
RDW: 13.2 % (ref 11.5–15.5)
WBC: 8 10*3/uL (ref 4.0–10.5)
nRBC: 0 % (ref 0.0–0.2)

## 2018-10-06 LAB — COMPREHENSIVE METABOLIC PANEL
ALT: 20 U/L (ref 0–44)
AST: 21 U/L (ref 15–41)
Albumin: 3.2 g/dL — ABNORMAL LOW (ref 3.5–5.0)
Alkaline Phosphatase: 93 U/L (ref 38–126)
Anion gap: 7 (ref 5–15)
BUN: 19 mg/dL (ref 8–23)
CO2: 22 mmol/L (ref 22–32)
Calcium: 8.5 mg/dL — ABNORMAL LOW (ref 8.9–10.3)
Chloride: 108 mmol/L (ref 98–111)
Creatinine, Ser: 1.08 mg/dL — ABNORMAL HIGH (ref 0.44–1.00)
GFR calc Af Amer: 59 mL/min — ABNORMAL LOW (ref >60)
GFR calc non Af Amer: 51 mL/min — ABNORMAL LOW (ref >60)
Glucose, Bld: 115 mg/dL — ABNORMAL HIGH (ref 70–99)
Potassium: 4.1 mmol/L (ref 3.5–5.1)
Sodium: 137 mmol/L (ref 135–145)
Total Bilirubin: 0.7 mg/dL (ref 0.3–1.2)
Total Protein: 6.3 g/dL — ABNORMAL LOW (ref 6.5–8.1)

## 2018-10-06 LAB — GLUCOSE, CAPILLARY
Glucose-Capillary: 104 mg/dL — ABNORMAL HIGH (ref 70–99)
Glucose-Capillary: 98 mg/dL (ref 70–99)

## 2018-10-06 MED ORDER — HYDRALAZINE HCL 20 MG/ML IJ SOLN: 10.0000 mg | INTRAMUSCULAR | Status: DC | PRN

## 2018-10-06 MED ORDER — PHENOL 1.4 % MT LIQD
1.0000 | OROMUCOSAL | Status: DC | PRN
  Filled 2018-10-06: qty 177

## 2018-10-06 MED ORDER — SENNOSIDES-DOCUSATE SODIUM 8.6-50 MG PO TABS: 2.0000 | ORAL_TABLET | Freq: Every evening | ORAL | Status: DC | PRN

## 2018-10-06 MED ORDER — GUAIFENESIN-DM 100-10 MG/5ML PO SYRP: 5.0000 mL | ORAL_SOLUTION | ORAL | Status: DC | PRN

## 2018-10-06 MED ORDER — LIP MEDEX EX OINT: 1.0000 "application " | TOPICAL_OINTMENT | CUTANEOUS | Status: DC | PRN

## 2018-10-06 MED ORDER — HYDROCORTISONE 1 % EX CREA
1.0000 "application " | TOPICAL_CREAM | Freq: Three times a day (TID) | CUTANEOUS | Status: DC | PRN
  Filled 2018-10-06: qty 28

## 2018-10-06 MED ORDER — ALUM & MAG HYDROXIDE-SIMETH 200-200-20 MG/5ML PO SUSP: 30.0000 mL | ORAL | Status: DC | PRN

## 2018-10-06 MED ORDER — CEFPODOXIME PROXETIL 200 MG PO TABS: 200.0000 mg | ORAL_TABLET | Freq: Two times a day (BID) | ORAL | 0 refills | Status: AC

## 2018-10-06 MED ORDER — SALINE SPRAY 0.65 % NA SOLN
1.0000 | NASAL | Status: DC | PRN
  Filled 2018-10-06: qty 44

## 2018-10-06 MED ORDER — HYDROCORTISONE (PERIANAL) 2.5 % EX CREA
1.0000 "application " | TOPICAL_CREAM | Freq: Four times a day (QID) | CUTANEOUS | Status: DC | PRN
  Filled 2018-10-06: qty 28.35

## 2018-10-06 MED ORDER — POLYVINYL ALCOHOL 1.4 % OP SOLN
1.0000 [drp] | OPHTHALMIC | Status: DC | PRN
  Filled 2018-10-06: qty 15

## 2018-10-06 MED ORDER — MUSCLE RUB 10-15 % EX CREA
1.0000 "application " | TOPICAL_CREAM | CUTANEOUS | Status: DC | PRN
  Filled 2018-10-06: qty 85

## 2018-10-06 MED ORDER — LORATADINE 10 MG PO TABS: 10.0000 mg | ORAL_TABLET | Freq: Every day | ORAL | Status: DC | PRN

## 2018-10-06 MED ORDER — POLYETHYLENE GLYCOL 3350 17 G PO PACK: 17.0000 g | PACK | Freq: Every day | ORAL | Status: DC | PRN

## 2018-10-06 NOTE — Evaluation (Signed)
Physical Therapy Evaluation Patient Details Name: Tracey Evans MRN: [id number] DOB: 11-23-1945 Today's Date: 10/06/2018   History of Present Illness  73 yo female admitted with UTI. Hx of frequent falls, breast cancer, fibromyalgia, dyspnea, DM, chronic LBP, OA, recent surgery (ablation) R LE  Clinical Impression  On eval, pt required Min assist for mobility. She was able to walk ~75 feet with a RW. Pt presents with general weakness, decreased activity tolerance, and impaired gait and balance. She has a history of frequent falls. She c/o 8/10 pain in L LQ and R LE. Discussed d/c plan-pt stated she plans to d/c home today. Per pt, she has some personal tasks that she has to take care of. Encouraged pt to consider staying an additional night to have at least 1 more session of PT before going home. Pt politely declined this recommendation. She stated "Im going home today." Will recommend HHPT f/u and a RW for safe ambulation.     Follow Up Recommendations Home health PT;Supervision for mobility/OOB    Equipment Recommendations  Rolling walker with 5" wheels    Recommendations for Other Services       Precautions / Restrictions Precautions Precautions: Fall Restrictions Weight Bearing Restrictions: No      Mobility  Bed Mobility Overal bed mobility: Needs Assistance Bed Mobility: Supine to Sit     Supine to sit: Supervision;HOB elevated     General bed mobility comments: Increased time and effort. Pt relied on bedrail. Supervision for lines, safety  Transfers Overall transfer level: Needs assistance Equipment used: Rolling walker (2 wheeled) Transfers: Sit to/from Stand Sit to Stand: Min assist         General transfer comment: Multiple attempts unassisted-pt unable. With bed elevated and therapist assist, pt was able to power up. Cues for safety, technique, hand plaement. Assist to rise, control descent.  Ambulation/Gait Ambulation/Gait assistance: Min guard Gait  Distance (Feet): 75 Feet Assistive device: Rolling walker (2 wheeled) Gait Pattern/deviations: Step-to pattern;Step-through pattern;Decreased stride length     General Gait Details: slow gait speed. Close guard for safety.  Stairs            Wheelchair Mobility    Modified Rankin (Stroke Patients Only)       Balance Overall balance assessment: Needs assistance         Standing balance support: Bilateral upper extremity supported Standing balance-Leahy Scale: Poor                               Pertinent Vitals/Pain Pain Assessment: 0-10 Pain Score: 8  Pain Location: L LQ, R LE Pain Descriptors / Indicators: Sore;Aching;Spasm;Sharp Pain Intervention(s): Monitored during session    Home Living Family/patient expects to be discharged to:: Private residence Living Arrangements: Alone   Type of Home: Apartment Home Access: Level entry(but has steps to get from parking lot to sidewalk)       Home Equipment: Gilford Rile - 2 wheels      Prior Function Level of Independence: Independent with assistive device(s)         Comments: uses RW     Hand Dominance        Extremity/Trunk Assessment   Upper Extremity Assessment Upper Extremity Assessment: Generalized weakness         Cervical / Trunk Assessment Cervical / Trunk Assessment: Normal  Communication   Communication: No difficulties  Cognition Arousal/Alertness: Awake/alert Behavior During Therapy: WFL for tasks assessed/performed Overall Cognitive  Status: Within Functional Limits for tasks assessed                                        General Comments      Exercises     Assessment/Plan    PT Assessment Patient needs continued PT services  PT Problem List Decreased strength;Decreased mobility;Decreased range of motion;Decreased activity tolerance;Decreased balance;Decreased knowledge of use of DME;Pain       PT Treatment Interventions DME instruction;Gait  training;Therapeutic exercise;Therapeutic activities;Patient/family education;Functional mobility training;Balance training    PT Goals (Current goals can be found in the Care Plan section)  Acute Rehab PT Goals Patient Stated Goal: less pain. PT Goal Formulation: With patient Time For Goal Achievement: 10/20/18 Potential to Achieve Goals: Good    Frequency Min 3X/week   Barriers to discharge        Co-evaluation               AM-PAC PT "6 Clicks" Mobility  Outcome Measure Help needed turning from your back to your side while in a flat bed without using bedrails?: A Little Help needed moving from lying on your back to sitting on the side of a flat bed without using bedrails?: A Little Help needed moving to and from a bed to a chair (including a wheelchair)?: A Little Help needed standing up from a chair using your arms (e.g., wheelchair or bedside chair)?: A Little Help needed to walk in hospital room?: A Little Help needed climbing 3-5 steps with a railing? : A Little 6 Click Score: 18    End of Session Equipment Utilized During Treatment: Gait belt Activity Tolerance: Patient tolerated treatment well Patient left: in chair;with call bell/phone within reach   PT Visit Diagnosis: History of falling (Z91.81);Repeated falls (R29.6);Other abnormalities of gait and mobility (R26.89);Muscle weakness (generalized) (M62.81)    Time: 7564-3329 PT Time Calculation (min) (ACUTE ONLY): 29 min   Charges:   PT Evaluation $PT Eval Moderate Complexity: 1 Mod PT Treatments $Gait Training: 8-22 mins          Weston Anna, PT Acute Rehabilitation Services Pager: [phone] Office: [phone]

## 2018-10-06 NOTE — TOC Initial Note (Signed)
Transition of Care [facility]) - Initial/Assessment Note    Patient Details  Name: Tracey Evans MRN: [id number] Date of Birth: 01-Jan-1946  Transition of Care [facility]) CM/SW Contact:    Joaquin Courts, RN Phone Number: 10/06/2018, 11:35 AM  Clinical Narrative:     CM spoke with patient. [facility]. [place] rep, Corene Cornea, notified of referral.                Expected Discharge Plan: [place] Barriers to Discharge: No Barriers Identified   Patient Goals and CMS Choice Patient states their goals for this hospitalization and ongoing recovery are:: to get better CMS Medicare.gov Compare Post Acute Care list provided to:: Patient Choice offered to / list presented to : Patient  Expected Discharge Plan and Services Expected Discharge Plan: [place]   Discharge Planning Services: CM Consult Post Acute Care Choice: [place] arrangements for the past 2 months: Apartment Expected Discharge Date: 10/06/18               DME Arranged: N/A DME Agency: NA       HH Arranged: PT, OT [place] Agency: [place] ([facility]) Date HH Agency Contacted: 10/06/18 Time [place]: 1135 Representative spoke with at [place]: [place] Arrangements/Services Living arrangements for the past 2 months: [place] with:: Self Patient language and need for interpreter reviewed:: Yes Do you feel safe going back to the place where you live?: Yes      Need for Family Participation in Patient Care: Yes (Comment) Care giver support system in place?: Yes (comment)   Criminal Activity/Legal Involvement Pertinent to Current Situation/Hospitalization: No - Comment as needed  Activities of Daily Living Home Assistive Devices/Equipment: Cane (specify quad or straight), Walker (specify type) ADL Screening (condition at time of admission) Patient's cognitive ability adequate to  safely complete daily activities?: Yes Is the patient deaf or have difficulty hearing?: No Does the patient have difficulty seeing, even when wearing glasses/contacts?: No Does the patient have difficulty concentrating, remembering, or making decisions?: No Patient able to express need for assistance with ADLs?: Yes Does the patient have difficulty dressing or bathing?: No Independently performs ADLs?: Yes (appropriate for developmental age) Does the patient have difficulty walking or climbing stairs?: Yes Weakness of Legs: Left Weakness of Arms/Hands: None  Permission Sought/Granted                  Emotional Assessment Appearance:: Appears stated age Attitude/Demeanor/Rapport: Engaged Affect (typically observed): Accepting Orientation: : Oriented to Self, Oriented to Place, Oriented to  Time, Oriented to Situation   Psych Involvement: No (comment)  Admission diagnosis:  Acute UTI [N39.0] Low blood pressure reading [R03.1] AKI (acute kidney injury) ([place]) [N17.9] Left lower quadrant abdominal pain [R10.32] Patient Active Problem List   Diagnosis Date Noted  . UTI (urinary tract infection) 10/05/2018  . Hypotension 10/05/2018  . AKI (acute kidney injury) ([place]) 10/05/2018  . Cervical disc disease 05/02/2017  . Anemia associated with nutritional deficiency 01/13/2016  . Syncope 10/05/2015  . Genetic testing 08/21/2015  . Family history of breast cancer in female 07/15/2015  . Family history of colon cancer 07/15/2015  . Breast cancer of lower-outer quadrant of right female breast ([place]) 07/08/2015  . Disturbance in sleep behavior 11/18/2014  . Fibromyalgia 10/04/2014  . Diverticulitis of colon (without mention of hemorrhage)(562.11) 12/01/2013  . Dry mouth 04/29/2013  . Dry  eye 04/29/2013  . Obesity (BMI 30-39.9) 03/11/2013  . Edema 09/04/2012  . Depression 09/04/2012  . Chemotherapy-induced peripheral neuropathy ([place]) 05/12/2012  . OAB (overactive bladder) 04/01/2012   . DM w/o complication type II ([place]) 02/06/2012  . Allergy to dogs 04/19/2011  . Polyarthralgia 09/18/2010  . STRESS INCONTINENCE 04/25/2010  . Asthma 04/11/2010  . VERTIGO 04/11/2010  . Hypothyroidism 01/19/2010  . Hyperlipidemia associated with type 2 diabetes mellitus ([place]) 01/19/2010  . Essential hypertension 01/19/2010  . GERD 01/19/2010  . PEPTIC ULCER DISEASE 01/19/2010  . OSTEOARTHRITIS 01/19/2010  . LOW BACK PAIN 01/19/2010   PCP:  Midge Minium, MD Pharmacy:   [id number] [place], [place] [address] [place] [address] Phone: [phone] Fax: [phone]  [facility] [place], [place] [address] Phone: [phone] Fax: [phone]  [id number] [place], [place] [address] [place] [address] Phone: [phone] Fax: [phone]     Social Determinants of Health (SDOH) Interventions    Readmission Risk Interventions No flowsheet data found.

## 2018-10-06 NOTE — Progress Notes (Signed)

## 2018-10-06 NOTE — Discharge Summary (Signed)
Physician Discharge Summary  Tracey Evans VHQ:[id number] DOB: [birth year] DOA: 10/05/2018  PCP: Midge Minium, MD  Admit date: 10/05/2018 Discharge date: 10/06/2018  Admitted From:Home Disposition: Home  Recommendations for Outpatient Follow-up:  1. Follow up with PCP in 1-2 weeks 2. Please obtain BMP/CBC in one week your next doctors visit.  3. Oral Vantin for 5 days for urinary tract infection 4. Advised oral hydration at home.   Discharge Condition: Stable CODE STATUS: DNR Diet recommendation: Diabetic  Brief/Interim Summary: 73 year old with history of anxiety, asthma, diverticulosis, depression, essential hypertension came to the hospital with complains of lower abdominal pain.  CT of the abdomen pelvis was negative for any acute pathology.  She was found to have mild acute kidney injury and urinary tract infection.  She was started on Rocephin.  The following day she felt much better and her creatinine trended down to baseline of 1.0. During hospitalization she complained of weakness therefore eval by physical therapy which is currently pending but will be discharged today after the evaluation.   Discharge Diagnoses:  Active Problems:   Hypothyroidism   Essential hypertension   GERD   DM w/o complication type II (HCC)   Obesity (BMI 30-39.9)   UTI (urinary tract infection)   Hypotension   AKI (acute kidney injury) ([place])  Acute lower tract urinary tract infection;POA - Culture data is pending.  Patient really wishes to go home, will transition IV Rocephin to 5 more days of oral Vantin.  Advised oral hydration at home. -CT of the abdomen pelvis negative for acute pathology  Generalized weakness - We will get PT evaluation prior to her discharge.  Appropriate arrangements will be made.  Acute kidney injury, prerenal -Baseline creatinine 1.0.  Upon admission it was 1.4 which is trended down to 1.0 after IV fluids.  Essential hypertension -Resume home  meds  Diabetes mellitus type 2 -Does not appear to be in any home medication.  Diet controlled.  Hyperlipidemia -Continue statin  History of depression -Continue citalopram.  GERD -PPI  Hypothyroidism -Continue Synthroid  Consultations:  None  Subjective: Feels better.  Slight lower abdominal discomfort but much improved from yesterday.  Wishes to go home. Tells me she has unfortunate death in her family and has funeral tomorrow.  Discharge Exam: Vitals:   10/05/18 2227 10/06/18 0443  BP: 101/62 (!) 102/54  Pulse: 78 72  Resp:  20  Temp:  99.2 F (37.3 C)  SpO2:  93%   Vitals:   10/05/18 1823 10/05/18 2100 10/05/18 2227 10/06/18 0443  BP:  (!) 82/53 101/62 (!) 102/54  Pulse:  76 78 72  Resp:  16  20  Temp:  99.6 F (37.6 C)  99.2 F (37.3 C)  TempSrc:  Oral  Oral  SpO2:  96%  93%  Weight: 95.2 kg   97.2 kg  Height: 5\' 5"  (1.651 m)       General: Pt is alert, awake, not in acute distress Cardiovascular: RRR, S1/S2 +, no rubs, no gallops Respiratory: CTA bilaterally, no wheezing, no rhonchi Abdominal: Soft, NT, ND, bowel sounds + Extremities: no edema, no cyanosis  Discharge Instructions  Discharge Instructions    Diet - low sodium heart healthy   Complete by: As directed    Increase activity slowly   Complete by: As directed      Allergies as of 10/06/2018      Reactions   Contrast Media [iodinated Diagnostic Agents] Shortness Of Breath   Patient has SOB, tightness in  chest and feels like an elephant is sitting on chest, patient should be  scanned at hospital Per Dr. Alvester Chou   Dilaudid [hydromorphone Hcl] Anaphylaxis   Pt stopped breathing   Adhesive [tape]    blister   Codeine Nausea Only   insomnia   Lactose Intolerance (gi) Diarrhea      Medication List    TAKE these medications   albuterol 108 (90 Base) MCG/ACT inhaler Commonly known as: VENTOLIN HFA Inhale 2 puffs into the lungs every 6 (six) hours as needed for wheezing or shortness  of breath.   ALPRAZolam 1 MG tablet Commonly known as: XANAX TAKE 1 TABLET BY MOUTH TWICE A DAY AS NEEDED What changed: reasons to take this   amLODipine 2.5 MG tablet Commonly known as: NORVASC TAKE 1 TABLET BY MOUTH EVERY DAY   aspirin 81 MG tablet Take 81 mg by mouth daily.   atorvastatin 20 MG tablet Commonly known as: LIPITOR every evening.   buPROPion 150 MG 24 hr tablet Commonly known as: WELLBUTRIN XL TAKE 1 TABLET BY MOUTH EVERY DAY   cefpodoxime 200 MG tablet Commonly known as: VANTIN Take 1 tablet (200 mg total) by mouth 2 (two) times daily for 5 days.   citalopram 20 MG tablet Commonly known as: CELEXA Take 1 tablet (20 mg total) by mouth daily.   diphenhydrAMINE 50 MG tablet Commonly known as: BENADRYL Take one tablet (50mg s total) one hour prior to study.   letrozole 2.5 MG tablet Commonly known as: FEMARA TAKE 1 TABLET (2.5 MG TOTAL) BY MOUTH DAILY.   levothyroxine 75 MCG tablet Commonly known as: SYNTHROID TAKE 1 TABLET BY MOUTH EVERY DAY What changed: when to take this   meloxicam 15 MG tablet Commonly known as: MOBIC TAKE 1 TABLET BY MOUTH EVERY DAY   metoprolol tartrate 25 MG tablet Commonly known as: LOPRESSOR Take 0.5 tablets (12.5 mg total) by mouth 2 (two) times daily.   nitroGLYCERIN 0.4 MG SL tablet Commonly known as: Nitrostat Place 1 tablet (0.4 mg total) under the tongue every 5 (five) minutes as needed. As needed for chest pain What changed:   reasons to take this  additional instructions   pantoprazole 40 MG tablet Commonly known as: PROTONIX Take 40 mg by mouth daily.   REFRESH DRY EYE THERAPY OP Place 1 drop into both eyes 3 (three) times daily.   tiZANidine 4 MG tablet Commonly known as: ZANAFLEX Take 4 mg by mouth every 8 (eight) hours as needed for muscle spasms.       Allergies  Allergen Reactions  . Contrast Media [Iodinated Diagnostic Agents] Shortness Of Breath    Patient has SOB, tightness in chest  and feels like an elephant is sitting on chest, patient should be  scanned at hospital Per Dr. Alvester Chou  . Dilaudid [Hydromorphone Hcl] Anaphylaxis    Pt stopped breathing  . Adhesive [Tape]     blister  . Codeine Nausea Only    insomnia  . Lactose Intolerance (Gi) Diarrhea    You were cared for by a hospitalist during your hospital stay. If you have any questions about your discharge medications or the care you received while you were in the hospital after you are discharged, you can call the unit and asked to speak with the hospitalist on call if the hospitalist that took care of you is not available. Once you are discharged, your primary care physician will handle any further medical issues. Please note that no refills for any discharge  medications will be authorized once you are discharged, as it is imperative that you return to your primary care physician (or establish a relationship with a primary care physician if you do not have one) for your aftercare needs so that they can reassess your need for medications and monitor your lab values.   Procedures/Studies: Ct Renal Stone Study  Result Date: 10/05/2018 CLINICAL DATA:  Left lower quadrant abdominal pain and groin pain. Burning with urination. EXAM: CT ABDOMEN AND PELVIS WITHOUT CONTRAST TECHNIQUE: Multidetector CT imaging of the abdomen and pelvis was performed following the standard protocol without IV contrast. COMPARISON:  CT scan dated 07/30/2016 FINDINGS: Lower chest: Previous right mastectomy.  Otherwise normal. Hepatobiliary: No focal liver abnormality is seen. Status post cholecystectomy. No biliary dilatation. Pancreas: Increased fatty replacement of the pancreas. Spleen: Normal in size without focal abnormality. Adrenals/Urinary Tract: Adrenal glands are normal. Right kidney is normal. Stable cysts on the upper pole of the left kidney. No hydronephrosis. Bladder is normal. Stomach/Bowel: Extensive diverticulosis of the left side of  the colon. Stomach and small bowel appear normal. Appendix is normal. Vascular/Lymphatic: Aortic atherosclerosis. No enlarged abdominal or pelvic lymph nodes. Reproductive: Status post hysterectomy. No adnexal masses. Other: No abdominal wall hernia or abnormality. No abdominopelvic ascites. Musculoskeletal: No acute abnormalities. Severe facet arthritis at L4-5 bilaterally and L5-S1 on the right. IMPRESSION: No acute abnormalities. Extensive diverticulosis of the left side of the colon. Specifically, no evidence of renal or ureteral calculi. Electronically Signed   By: Lorriane Shire M.D.   On: 10/05/2018 12:43      The results of significant diagnostics from this hospitalization (including imaging, microbiology, ancillary and laboratory) are listed below for reference.     Microbiology: Recent Results (from the past 240 hour(s))  SARS Coronavirus 2 (CEPHEID - Performed in [place] hospital lab), Hosp Order     Status: None   Collection Time: 10/05/18  3:19 PM   Specimen: Nasopharyngeal Swab  Result Value Ref Range Status   SARS Coronavirus 2 NEGATIVE NEGATIVE Final    Comment: (NOTE) If result is NEGATIVE SARS-CoV-2 target nucleic acids are NOT DETECTED. The SARS-CoV-2 RNA is generally detectable in upper and lower  respiratory specimens during the acute phase of infection. The lowest  concentration of SARS-CoV-2 viral copies this assay can detect is 250  copies / mL. A negative result does not preclude SARS-CoV-2 infection  and should not be used as the sole basis for treatment or other  patient management decisions.  A negative result may occur with  improper specimen collection / handling, submission of specimen other  than nasopharyngeal swab, presence of viral mutation(s) within the  areas targeted by this assay, and inadequate number of viral copies  (<250 copies / mL). A negative result must be combined with clinical  observations, patient history, and epidemiological  information. If result is POSITIVE SARS-CoV-2 target nucleic acids are DETECTED. The SARS-CoV-2 RNA is generally detectable in upper and lower  respiratory specimens dur ing the acute phase of infection.  Positive  results are indicative of active infection with SARS-CoV-2.  Clinical  correlation with patient history and other diagnostic information is  necessary to determine patient infection status.  Positive results do  not rule out bacterial infection or co-infection with other viruses. If result is PRESUMPTIVE POSTIVE SARS-CoV-2 nucleic acids MAY BE PRESENT.   A presumptive positive result was obtained on the submitted specimen  and confirmed on repeat testing.  While 2019 novel coronavirus  (SARS-CoV-2)  nucleic acids may be present in the submitted sample  additional confirmatory testing may be necessary for epidemiological  and / or clinical management purposes  to differentiate between  SARS-CoV-2 and other Sarbecovirus currently known to infect humans.  If clinically indicated additional testing with an alternate test  methodology [phone]) is advised. The SARS-CoV-2 RNA is generally  detectable in upper and lower respiratory sp ecimens during the acute  phase of infection. The expected result is Negative. Fact Sheet for Patients:  StrictlyIdeas.no Fact Sheet for Healthcare Providers: BankingDealers.co.za This test is not yet approved or cleared by the [place] FDA and has been authorized for detection and/or diagnosis of SARS-CoV-2 by FDA under an Emergency Use Authorization (EUA).  This EUA will remain in effect (meaning this test can be used) for the duration of the COVID-19 declaration under Section 564(b)(1) of the Act, 21 U.S.C. section 360bbb-3(b)(1), unless the authorization is terminated or revoked sooner. Performed at [facility], [place] [address], [place], [place] [address]      Labs: BNP  (last 3 results) No results for input(s): BNP in the last 8760 hours. Basic Metabolic Panel: Recent Labs  Lab 10/05/18 1153 10/06/18 0500  NA 137 137  K 4.6 4.1  CL 102 108  CO2 24 22  GLUCOSE 120* 115*  BUN 30* 19  CREATININE 1.45* 1.08*  CALCIUM 9.2 8.5*  MG 2.1  --    Liver Function Tests: Recent Labs  Lab 10/05/18 1153 10/06/18 0500  AST 27 21  ALT 22 20  ALKPHOS 102 93  BILITOT 1.3* 0.7  PROT 7.4 6.3*  ALBUMIN 4.0 3.2*   No results for input(s): LIPASE, AMYLASE in the last 168 hours. No results for input(s): AMMONIA in the last 168 hours. CBC: Recent Labs  Lab 10/05/18 1153 10/06/18 0500  WBC 11.4* 8.0  NEUTROABS 8.2*  --   HGB 11.8* 11.2*  HCT 37.3 36.5  MCV 94.7 97.1  PLT 283 269   Cardiac Enzymes: No results for input(s): CKTOTAL, CKMB, CKMBINDEX, TROPONINI in the last 168 hours. BNP: Invalid input(s): POCBNP CBG: Recent Labs  Lab 10/05/18 1725 10/05/18 2143 10/06/18 0732  GLUCAP 130* 121* 104*   D-Dimer No results for input(s): DDIMER in the last 72 hours. Hgb A1c No results for input(s): HGBA1C in the last 72 hours. Lipid Profile No results for input(s): CHOL, HDL, LDLCALC, TRIG, CHOLHDL, LDLDIRECT in the last 72 hours. Thyroid function studies No results for input(s): TSH, T4TOTAL, T3FREE, THYROIDAB in the last 72 hours.  Invalid input(s): FREET3 Anemia work up No results for input(s): VITAMINB12, FOLATE, FERRITIN, TIBC, IRON, RETICCTPCT in the last 72 hours. Urinalysis    Component Value Date/Time   COLORURINE YELLOW 10/05/2018 1352   APPEARANCEUR HAZY (A) 10/05/2018 1352   LABSPEC 1.025 10/05/2018 1352   PHURINE 5.0 10/05/2018 1352   GLUCOSEU NEGATIVE 10/05/2018 1352   HGBUR SMALL (A) 10/05/2018 1352   BILIRUBINUR NEGATIVE 10/05/2018 1352   BILIRUBINUR 1+ 07/08/2017 1020   KETONESUR NEGATIVE 10/05/2018 1352   PROTEINUR NEGATIVE 10/05/2018 1352   UROBILINOGEN 0.2 07/08/2017 1020   UROBILINOGEN 0.2 07/18/2014 1133   NITRITE  NEGATIVE 10/05/2018 1352   LEUKOCYTESUR LARGE (A) 10/05/2018 1352   Sepsis Labs Invalid input(s): PROCALCITONIN,  WBC,  LACTICIDVEN Microbiology Recent Results (from the past 240 hour(s))  SARS Coronavirus 2 (CEPHEID - Performed in [place] hospital lab), Hosp Order     Status: None   Collection Time: 10/05/18  3:19 PM  Specimen: Nasopharyngeal Swab  Result Value Ref Range Status   SARS Coronavirus 2 NEGATIVE NEGATIVE Final    Comment: (NOTE) If result is NEGATIVE SARS-CoV-2 target nucleic acids are NOT DETECTED. The SARS-CoV-2 RNA is generally detectable in upper and lower  respiratory specimens during the acute phase of infection. The lowest  concentration of SARS-CoV-2 viral copies this assay can detect is 250  copies / mL. A negative result does not preclude SARS-CoV-2 infection  and should not be used as the sole basis for treatment or other  patient management decisions.  A negative result may occur with  improper specimen collection / handling, submission of specimen other  than nasopharyngeal swab, presence of viral mutation(s) within the  areas targeted by this assay, and inadequate number of viral copies  (<250 copies / mL). A negative result must be combined with clinical  observations, patient history, and epidemiological information. If result is POSITIVE SARS-CoV-2 target nucleic acids are DETECTED. The SARS-CoV-2 RNA is generally detectable in upper and lower  respiratory specimens dur ing the acute phase of infection.  Positive  results are indicative of active infection with SARS-CoV-2.  Clinical  correlation with patient history and other diagnostic information is  necessary to determine patient infection status.  Positive results do  not rule out bacterial infection or co-infection with other viruses. If result is PRESUMPTIVE POSTIVE SARS-CoV-2 nucleic acids MAY BE PRESENT.   A presumptive positive result was obtained on the submitted specimen  and  confirmed on repeat testing.  While 2019 novel coronavirus  (SARS-CoV-2) nucleic acids may be present in the submitted sample  additional confirmatory testing may be necessary for epidemiological  and / or clinical management purposes  to differentiate between  SARS-CoV-2 and other Sarbecovirus currently known to infect humans.  If clinically indicated additional testing with an alternate test  methodology [phone]) is advised. The SARS-CoV-2 RNA is generally  detectable in upper and lower respiratory sp ecimens during the acute  phase of infection. The expected result is Negative. Fact Sheet for Patients:  StrictlyIdeas.no Fact Sheet for Healthcare Providers: BankingDealers.co.za This test is not yet approved or cleared by the [place] FDA and has been authorized for detection and/or diagnosis of SARS-CoV-2 by FDA under an Emergency Use Authorization (EUA).  This EUA will remain in effect (meaning this test can be used) for the duration of the COVID-19 declaration under Section 564(b)(1) of the Act, 21 U.S.C. section 360bbb-3(b)(1), unless the authorization is terminated or revoked sooner. Performed at [facility], [place] [address], [place], [place] [address]      Time coordinating discharge:  I have spent 35 minutes face to face with the patient and on the ward discussing the patients care, assessment, plan and disposition with other care givers. >50% of the time was devoted counseling the patient about the risks and benefits of treatment/Discharge disposition and coordinating care.   SIGNED:   Damita Lack, MD  [facility] 10/06/2018, 10:08 AM   If 7PM-7AM, please contact night-coverage [email or web address]

## 2018-10-07 ENCOUNTER — Telehealth: Payer: Self-pay

## 2018-10-07 DIAGNOSIS — Z7982 Long term (current) use of aspirin: Secondary | ICD-10-CM | POA: Diagnosis not present

## 2018-10-07 DIAGNOSIS — Z853 Personal history of malignant neoplasm of breast: Secondary | ICD-10-CM | POA: Diagnosis not present

## 2018-10-07 DIAGNOSIS — N390 Urinary tract infection, site not specified: Secondary | ICD-10-CM | POA: Diagnosis not present

## 2018-10-07 DIAGNOSIS — E669 Obesity, unspecified: Secondary | ICD-10-CM | POA: Diagnosis not present

## 2018-10-07 DIAGNOSIS — J42 Unspecified chronic bronchitis: Secondary | ICD-10-CM | POA: Diagnosis not present

## 2018-10-07 DIAGNOSIS — J45909 Unspecified asthma, uncomplicated: Secondary | ICD-10-CM | POA: Diagnosis not present

## 2018-10-07 DIAGNOSIS — K5790 Diverticulosis of intestine, part unspecified, without perforation or abscess without bleeding: Secondary | ICD-10-CM | POA: Diagnosis not present

## 2018-10-07 DIAGNOSIS — I10 Essential (primary) hypertension: Secondary | ICD-10-CM | POA: Diagnosis not present

## 2018-10-07 DIAGNOSIS — E119 Type 2 diabetes mellitus without complications: Secondary | ICD-10-CM | POA: Diagnosis not present

## 2018-10-07 DIAGNOSIS — M797 Fibromyalgia: Secondary | ICD-10-CM | POA: Diagnosis not present

## 2018-10-07 LAB — URINE CULTURE: Culture: 40000 — AB

## 2018-10-07 NOTE — Telephone Encounter (Signed)
Transition Care Management Follow-up Telephone Call  Admit date: 10/05/2018 Discharge date: 10/06/2018 Dx: UTI, abdominal pain   How have you been since you were released from the hospital? "I'm doing good"   Do you understand why you were in the hospital? yes   Do you understand the discharge instructions? yes   Where were you discharged to? Home.    Items Reviewed:  Medications reviewed: yes  Allergies reviewed: yes  Dietary changes reviewed: yes  Referrals reviewed: yes. Generalized weakness during hospitalization, PT at home 2/week.    Functional Questionnaire:   Activities of Daily Living (ADLs):   She states they are independent in the following: ambulation, bathing and hygiene, feeding, continence, grooming, toileting and dressing States they require assistance with the following: None.    Any transportation issues/concerns?: no   Any patient concerns? no   Confirmed importance and date/time of follow-up visits scheduled yes  Provider Appointment booked with PCP 10/14/2018 via virtual visit.   Confirmed with patient if condition begins to worsen call PCP or go to the ER.  Patient was given the office number and encouraged to call back with question or concerns.  : yes

## 2018-10-08 ENCOUNTER — Telehealth: Payer: Self-pay | Admitting: Family Medicine

## 2018-10-08 DIAGNOSIS — I10 Essential (primary) hypertension: Secondary | ICD-10-CM | POA: Diagnosis not present

## 2018-10-08 DIAGNOSIS — M797 Fibromyalgia: Secondary | ICD-10-CM | POA: Diagnosis not present

## 2018-10-08 DIAGNOSIS — J45909 Unspecified asthma, uncomplicated: Secondary | ICD-10-CM | POA: Diagnosis not present

## 2018-10-08 DIAGNOSIS — K5790 Diverticulosis of intestine, part unspecified, without perforation or abscess without bleeding: Secondary | ICD-10-CM | POA: Diagnosis not present

## 2018-10-08 DIAGNOSIS — N390 Urinary tract infection, site not specified: Secondary | ICD-10-CM | POA: Diagnosis not present

## 2018-10-08 DIAGNOSIS — E119 Type 2 diabetes mellitus without complications: Secondary | ICD-10-CM | POA: Diagnosis not present

## 2018-10-08 NOTE — Telephone Encounter (Signed)
Verbal Ok has been given to [employer]

## 2018-10-08 NOTE — Telephone Encounter (Signed)
Ok to [employer] orders for PT in PCP absence.

## 2018-10-08 NOTE — Telephone Encounter (Signed)
Home Health Verbal Orders - Caller/Agency: [facility] Callback Number: [phone] Requesting OT/PT/Skilled Nursing/Social Work/Speech Therapy: PT and OT Frequency: PT: 1x for 1wk,  4x for 2wks,  every other 4x OT: TBD

## 2018-10-08 NOTE — Telephone Encounter (Signed)
Please advise on verbal orders   Copied from [place] [phone]. Topic: General - Other >> Oct 08, 2018  8:49 AM Beverley Fiedler wrote: Tracey Evans from [place] [facility] would like verbal orders for  OT 1w5

## 2018-10-13 DIAGNOSIS — E119 Type 2 diabetes mellitus without complications: Secondary | ICD-10-CM | POA: Diagnosis not present

## 2018-10-13 DIAGNOSIS — M797 Fibromyalgia: Secondary | ICD-10-CM | POA: Diagnosis not present

## 2018-10-13 DIAGNOSIS — N390 Urinary tract infection, site not specified: Secondary | ICD-10-CM | POA: Diagnosis not present

## 2018-10-13 DIAGNOSIS — J45909 Unspecified asthma, uncomplicated: Secondary | ICD-10-CM | POA: Diagnosis not present

## 2018-10-13 DIAGNOSIS — K5790 Diverticulosis of intestine, part unspecified, without perforation or abscess without bleeding: Secondary | ICD-10-CM | POA: Diagnosis not present

## 2018-10-13 DIAGNOSIS — I10 Essential (primary) hypertension: Secondary | ICD-10-CM | POA: Diagnosis not present

## 2018-10-14 ENCOUNTER — Ambulatory Visit (INDEPENDENT_AMBULATORY_CARE_PROVIDER_SITE_OTHER): Payer: Medicare Other | Admitting: Family Medicine

## 2018-10-14 ENCOUNTER — Other Ambulatory Visit: Payer: Self-pay

## 2018-10-14 ENCOUNTER — Encounter: Payer: Self-pay | Admitting: Family Medicine

## 2018-10-14 DIAGNOSIS — N179 Acute kidney failure, unspecified: Secondary | ICD-10-CM | POA: Diagnosis not present

## 2018-10-14 DIAGNOSIS — K5790 Diverticulosis of intestine, part unspecified, without perforation or abscess without bleeding: Secondary | ICD-10-CM | POA: Diagnosis not present

## 2018-10-14 DIAGNOSIS — E119 Type 2 diabetes mellitus without complications: Secondary | ICD-10-CM | POA: Diagnosis not present

## 2018-10-14 DIAGNOSIS — J45909 Unspecified asthma, uncomplicated: Secondary | ICD-10-CM | POA: Diagnosis not present

## 2018-10-14 DIAGNOSIS — N390 Urinary tract infection, site not specified: Secondary | ICD-10-CM | POA: Diagnosis not present

## 2018-10-14 DIAGNOSIS — I10 Essential (primary) hypertension: Secondary | ICD-10-CM | POA: Diagnosis not present

## 2018-10-14 DIAGNOSIS — F3340 Major depressive disorder, recurrent, in remission, unspecified: Secondary | ICD-10-CM

## 2018-10-14 DIAGNOSIS — R296 Repeated falls: Secondary | ICD-10-CM | POA: Diagnosis not present

## 2018-10-14 DIAGNOSIS — M797 Fibromyalgia: Secondary | ICD-10-CM | POA: Diagnosis not present

## 2018-10-14 NOTE — Progress Notes (Signed)
Virtual Visit via Video   I connected with patient on 10/14/18 at  9:40 AM EDT by a video enabled telemedicine application and verified that I am speaking with the correct person using two identifiers.  Location patient: Home Location provider: Acupuncturist, Office Persons participating in the virtual visit: Patient, Provider, [place] (Jess B)  I discussed the limitations of evaluation and management by telemedicine and the availability of in person appointments. The patient expressed understanding and agreed to proceed.  Subjective:   HPI:   Hospital f/u- pt was admitted 6/28-29 w/ lower abd pain, mild AKI (Cr peaked at 1.4), and UTI.  She was started on IV Rocephin and then transitioned to PO Vantin.  Cr improved to 1.0 at time of d/c w/ IV fluids.  She was encouraged to increase her fluids.  While she was hospitalized, her sister passed away from [place].  Aunt also passed away recently.  She noted weakness in the hospital and had PT/OT consult due to 3 falls in last 2 weeks.  Home Health PT ordered.  Pt will be moving to [place] to be closer to daughter.  Denies abd pain, N/V, dysuria.  Reviewed past medical, surgical, family and social histories.  Reviewed hospital labs, notes, d/c summary.  ROS:   See pertinent positives and negatives per HPI.  Patient Active Problem List   Diagnosis Date Noted  . UTI (urinary tract infection) 10/05/2018  . Hypotension 10/05/2018  . AKI (acute kidney injury) ([place]) 10/05/2018  . Cervical disc disease 05/02/2017  . Anemia associated with nutritional deficiency 01/13/2016  . Syncope 10/05/2015  . Genetic testing 08/21/2015  . Family history of breast cancer in female 07/15/2015  . Family history of colon cancer 07/15/2015  . Breast cancer of lower-outer quadrant of right female breast ([place]) 07/08/2015  . Disturbance in sleep behavior 11/18/2014  . Fibromyalgia 10/04/2014  . Diverticulitis of colon (without mention of hemorrhage)(562.11)  12/01/2013  . Dry mouth 04/29/2013  . Dry eye 04/29/2013  . Obesity (BMI 30-39.9) 03/11/2013  . Edema 09/04/2012  . Depression 09/04/2012  . Chemotherapy-induced peripheral neuropathy ([place]) 05/12/2012  . OAB (overactive bladder) 04/01/2012  . DM w/o complication type II ([place]) 02/06/2012  . Allergy to dogs 04/19/2011  . Polyarthralgia 09/18/2010  . STRESS INCONTINENCE 04/25/2010  . Asthma 04/11/2010  . VERTIGO 04/11/2010  . Hypothyroidism 01/19/2010  . Hyperlipidemia associated with type 2 diabetes mellitus ([place]) 01/19/2010  . Essential hypertension 01/19/2010  . GERD 01/19/2010  . PEPTIC ULCER DISEASE 01/19/2010  . OSTEOARTHRITIS 01/19/2010  . LOW BACK PAIN 01/19/2010    Social History   Tobacco Use  . Smoking status: Never Smoker  . Smokeless tobacco: Never Used  Substance Use Topics  . Alcohol use: Not Currently    Alcohol/week: 0.0 standard drinks    Comment: 08/15/2015 "1/2 glass of wine q 2 months"     Current Outpatient Medications:  .  albuterol (VENTOLIN HFA) 108 (90 Base) MCG/ACT inhaler, Inhale 2 puffs into the lungs every 6 (six) hours as needed for wheezing or shortness of breath., Disp: 1 Inhaler, Rfl: 3 .  ALPRAZolam (XANAX) 1 MG tablet, TAKE 1 TABLET BY MOUTH TWICE A DAY AS NEEDED (Patient taking differently: Take 1 mg by mouth 2 (two) times daily as needed for anxiety. ), Disp: 60 tablet, Rfl: 1 .  amLODipine (NORVASC) 2.5 MG tablet, TAKE 1 TABLET BY MOUTH EVERY DAY, Disp: 90 tablet, Rfl: 1 .  aspirin 81 MG tablet, Take 81 mg by  mouth daily., Disp: , Rfl:  .  atorvastatin (LIPITOR) 20 MG tablet, every evening., Disp: , Rfl:  .  buPROPion (WELLBUTRIN XL) 150 MG 24 hr tablet, TAKE 1 TABLET BY MOUTH EVERY DAY, Disp: 90 tablet, Rfl: 2 .  citalopram (CELEXA) 20 MG tablet, Take 1 tablet (20 mg total) by mouth daily., Disp: 90 tablet, Rfl: 1 .  diphenhydrAMINE (BENADRYL) 50 MG tablet, Take one tablet (50mg s total) one hour prior to study., Disp: 1 tablet, Rfl: 0 .   Glycerin-Polysorbate 80 (REFRESH DRY EYE THERAPY OP), Place 1 drop into both eyes 3 (three) times daily. , Disp: , Rfl:  .  letrozole (FEMARA) 2.5 MG tablet, TAKE 1 TABLET (2.5 MG TOTAL) BY MOUTH DAILY., Disp: 90 tablet, Rfl: 1 .  levothyroxine (SYNTHROID, LEVOTHROID) 75 MCG tablet, TAKE 1 TABLET BY MOUTH EVERY DAY (Patient taking differently: Take 75 mcg by mouth daily before breakfast. ), Disp: 90 tablet, Rfl: 1 .  meloxicam (MOBIC) 15 MG tablet, TAKE 1 TABLET BY MOUTH EVERY DAY (Patient taking differently: Take 15 mg by mouth daily. ), Disp: 30 tablet, Rfl: 1 .  metoprolol tartrate (LOPRESSOR) 25 MG tablet, Take 0.5 tablets (12.5 mg total) by mouth 2 (two) times daily., Disp: 60 tablet, Rfl: 0 .  nitroGLYCERIN (NITROSTAT) 0.4 MG SL tablet, Place 1 tablet (0.4 mg total) under the tongue every 5 (five) minutes as needed. As needed for chest pain (Patient taking differently: Place 0.4 mg under the tongue every 5 (five) minutes as needed for chest pain. ), Disp: 30 tablet, Rfl: 3 .  pantoprazole (PROTONIX) 40 MG tablet, Take 40 mg by mouth daily. , Disp: , Rfl:  .  tiZANidine (ZANAFLEX) 4 MG tablet, Take 4 mg by mouth every 8 (eight) hours as needed for muscle spasms. , Disp: , Rfl: 0  Allergies  Allergen Reactions  . Contrast Media [Iodinated Diagnostic Agents] Shortness Of Breath    Patient has SOB, tightness in chest and feels like an elephant is sitting on chest, patient should be  scanned at hospital Per Dr. Alvester Chou  . Dilaudid [Hydromorphone Hcl] Anaphylaxis    Pt stopped breathing  . Adhesive [Tape]     blister  . Codeine Nausea Only    insomnia  . Lactose Intolerance (Gi) Diarrhea    Objective:   There were no vitals taken for this visit.  AAOx3, NAD NCAT, EOMI No obvious CN deficits Coloring WNL Pt is able to speak clearly, coherently without shortness of breath or increased work of breathing.  Thought process is linear.  Mood is appropriate.   Assessment and Plan:   AKI-  pt's Cr was 1.0 at time of d/c.  Will repeat BMP to ensure Cr remains stable and that pt is complying w/ increased hydration.  UTI- pt completed her oral abx and is currently asymptomatic.  Depression- pt is struggling w/ recent death of sister but is also excited and optimistic about her move to [place] in the fall.  She will do well to be near family and she knows this.  No changes in tx at this time.  Will follow.  Falls- pt has [facility] working with her.  Her apartment is apparently not handicapped accessible and poses a high risk for falls.  Will get list of concerns from PT and write letter for her to get out of her lease as she was told apt was accessible.   Annye Asa, MD 10/14/2018

## 2018-10-14 NOTE — Progress Notes (Signed)
I have discussed the procedure for the virtual visit with the patient who has given consent to proceed with assessment and treatment.   Pt unable to obtain vitals On PT/OT from hospital, they will be there at noon, will call in vitals.  Davis Gourd, CMA

## 2018-10-15 ENCOUNTER — Ambulatory Visit: Payer: Medicare Other

## 2018-10-16 DIAGNOSIS — I10 Essential (primary) hypertension: Secondary | ICD-10-CM | POA: Diagnosis not present

## 2018-10-16 DIAGNOSIS — J45909 Unspecified asthma, uncomplicated: Secondary | ICD-10-CM | POA: Diagnosis not present

## 2018-10-16 DIAGNOSIS — M797 Fibromyalgia: Secondary | ICD-10-CM | POA: Diagnosis not present

## 2018-10-16 DIAGNOSIS — K5790 Diverticulosis of intestine, part unspecified, without perforation or abscess without bleeding: Secondary | ICD-10-CM | POA: Diagnosis not present

## 2018-10-16 DIAGNOSIS — N390 Urinary tract infection, site not specified: Secondary | ICD-10-CM | POA: Diagnosis not present

## 2018-10-16 DIAGNOSIS — E119 Type 2 diabetes mellitus without complications: Secondary | ICD-10-CM | POA: Diagnosis not present

## 2018-10-17 ENCOUNTER — Other Ambulatory Visit (INDEPENDENT_AMBULATORY_CARE_PROVIDER_SITE_OTHER): Payer: Medicare Other

## 2018-10-17 DIAGNOSIS — N390 Urinary tract infection, site not specified: Secondary | ICD-10-CM

## 2018-10-17 DIAGNOSIS — N179 Acute kidney failure, unspecified: Secondary | ICD-10-CM | POA: Diagnosis not present

## 2018-10-17 LAB — CBC WITH DIFFERENTIAL/PLATELET
Basophils Absolute: 0 10*3/uL (ref 0.0–0.1)
Basophils Relative: 0.6 % (ref 0.0–3.0)
Eosinophils Absolute: 0.1 10*3/uL (ref 0.0–0.7)
Eosinophils Relative: 2 % (ref 0.0–5.0)
HCT: 37.3 % (ref 36.0–46.0)
Hemoglobin: 12.1 g/dL (ref 12.0–15.0)
Lymphocytes Relative: 34.3 % (ref 12.0–46.0)
Lymphs Abs: 2.6 10*3/uL (ref 0.7–4.0)
MCHC: 32.4 g/dL (ref 30.0–36.0)
MCV: 92.2 fl (ref 78.0–100.0)
Monocytes Absolute: 0.8 10*3/uL (ref 0.1–1.0)
Monocytes Relative: 10.6 % (ref 3.0–12.0)
Neutro Abs: 3.9 10*3/uL (ref 1.4–7.7)
Neutrophils Relative %: 52.5 % (ref 43.0–77.0)
Platelets: 410 10*3/uL — ABNORMAL HIGH (ref 150.0–400.0)
RBC: 4.04 Mil/uL (ref 3.87–5.11)
RDW: 13.7 % (ref 11.5–15.5)
WBC: 7.5 10*3/uL (ref 4.0–10.5)

## 2018-10-17 LAB — BASIC METABOLIC PANEL
BUN: 18 mg/dL (ref 6–23)
CO2: 28 mEq/L (ref 19–32)
Calcium: 9.4 mg/dL (ref 8.4–10.5)
Chloride: 104 mEq/L (ref 96–112)
Creatinine, Ser: 1.31 mg/dL — ABNORMAL HIGH (ref 0.40–1.20)
GFR: 48.19 mL/min — ABNORMAL LOW (ref >60.00)
Glucose, Bld: 100 mg/dL — ABNORMAL HIGH (ref 70–99)
Potassium: 3.8 mEq/L (ref 3.5–5.1)
Sodium: 141 mEq/L (ref 135–145)

## 2018-10-20 ENCOUNTER — Other Ambulatory Visit: Payer: Self-pay | Admitting: Family Medicine

## 2018-10-20 DIAGNOSIS — Z885 Allergy status to narcotic agent status: Secondary | ICD-10-CM | POA: Diagnosis not present

## 2018-10-20 DIAGNOSIS — J45909 Unspecified asthma, uncomplicated: Secondary | ICD-10-CM | POA: Diagnosis not present

## 2018-10-20 DIAGNOSIS — E039 Hypothyroidism, unspecified: Secondary | ICD-10-CM | POA: Diagnosis not present

## 2018-10-20 DIAGNOSIS — G894 Chronic pain syndrome: Secondary | ICD-10-CM | POA: Diagnosis not present

## 2018-10-20 DIAGNOSIS — M25562 Pain in left knee: Secondary | ICD-10-CM | POA: Diagnosis not present

## 2018-10-20 DIAGNOSIS — R7989 Other specified abnormal findings of blood chemistry: Secondary | ICD-10-CM

## 2018-10-20 DIAGNOSIS — M1712 Unilateral primary osteoarthritis, left knee: Secondary | ICD-10-CM | POA: Diagnosis not present

## 2018-10-20 DIAGNOSIS — F419 Anxiety disorder, unspecified: Secondary | ICD-10-CM | POA: Diagnosis not present

## 2018-10-20 DIAGNOSIS — Z91041 Radiographic dye allergy status: Secondary | ICD-10-CM | POA: Diagnosis not present

## 2018-10-20 DIAGNOSIS — R799 Abnormal finding of blood chemistry, unspecified: Secondary | ICD-10-CM

## 2018-10-20 DIAGNOSIS — G580 Intercostal neuropathy: Secondary | ICD-10-CM | POA: Diagnosis not present

## 2018-10-20 DIAGNOSIS — F329 Major depressive disorder, single episode, unspecified: Secondary | ICD-10-CM | POA: Diagnosis not present

## 2018-10-20 DIAGNOSIS — I10 Essential (primary) hypertension: Secondary | ICD-10-CM | POA: Diagnosis not present

## 2018-10-20 DIAGNOSIS — Z91048 Other nonmedicinal substance allergy status: Secondary | ICD-10-CM | POA: Diagnosis not present

## 2018-10-21 ENCOUNTER — Encounter: Payer: Self-pay | Admitting: Family Medicine

## 2018-10-21 DIAGNOSIS — E119 Type 2 diabetes mellitus without complications: Secondary | ICD-10-CM | POA: Diagnosis not present

## 2018-10-21 DIAGNOSIS — M797 Fibromyalgia: Secondary | ICD-10-CM | POA: Diagnosis not present

## 2018-10-21 DIAGNOSIS — J45909 Unspecified asthma, uncomplicated: Secondary | ICD-10-CM | POA: Diagnosis not present

## 2018-10-21 DIAGNOSIS — N390 Urinary tract infection, site not specified: Secondary | ICD-10-CM | POA: Diagnosis not present

## 2018-10-21 DIAGNOSIS — I10 Essential (primary) hypertension: Secondary | ICD-10-CM | POA: Diagnosis not present

## 2018-10-21 DIAGNOSIS — K5790 Diverticulosis of intestine, part unspecified, without perforation or abscess without bleeding: Secondary | ICD-10-CM | POA: Diagnosis not present

## 2018-10-22 DIAGNOSIS — K5790 Diverticulosis of intestine, part unspecified, without perforation or abscess without bleeding: Secondary | ICD-10-CM | POA: Diagnosis not present

## 2018-10-22 DIAGNOSIS — M797 Fibromyalgia: Secondary | ICD-10-CM | POA: Diagnosis not present

## 2018-10-22 DIAGNOSIS — N390 Urinary tract infection, site not specified: Secondary | ICD-10-CM | POA: Diagnosis not present

## 2018-10-22 DIAGNOSIS — I10 Essential (primary) hypertension: Secondary | ICD-10-CM | POA: Diagnosis not present

## 2018-10-22 DIAGNOSIS — E119 Type 2 diabetes mellitus without complications: Secondary | ICD-10-CM | POA: Diagnosis not present

## 2018-10-22 DIAGNOSIS — J45909 Unspecified asthma, uncomplicated: Secondary | ICD-10-CM | POA: Diagnosis not present

## 2018-10-23 DIAGNOSIS — M797 Fibromyalgia: Secondary | ICD-10-CM | POA: Diagnosis not present

## 2018-10-23 DIAGNOSIS — J45909 Unspecified asthma, uncomplicated: Secondary | ICD-10-CM | POA: Diagnosis not present

## 2018-10-23 DIAGNOSIS — K5790 Diverticulosis of intestine, part unspecified, without perforation or abscess without bleeding: Secondary | ICD-10-CM | POA: Diagnosis not present

## 2018-10-23 DIAGNOSIS — I10 Essential (primary) hypertension: Secondary | ICD-10-CM | POA: Diagnosis not present

## 2018-10-23 DIAGNOSIS — E119 Type 2 diabetes mellitus without complications: Secondary | ICD-10-CM | POA: Diagnosis not present

## 2018-10-23 DIAGNOSIS — N390 Urinary tract infection, site not specified: Secondary | ICD-10-CM | POA: Diagnosis not present

## 2018-10-27 DIAGNOSIS — M1990 Unspecified osteoarthritis, unspecified site: Secondary | ICD-10-CM | POA: Diagnosis not present

## 2018-10-27 DIAGNOSIS — E039 Hypothyroidism, unspecified: Secondary | ICD-10-CM | POA: Diagnosis not present

## 2018-10-27 DIAGNOSIS — F419 Anxiety disorder, unspecified: Secondary | ICD-10-CM | POA: Diagnosis not present

## 2018-10-27 DIAGNOSIS — Z885 Allergy status to narcotic agent status: Secondary | ICD-10-CM | POA: Diagnosis not present

## 2018-10-27 DIAGNOSIS — Z91041 Radiographic dye allergy status: Secondary | ICD-10-CM | POA: Diagnosis not present

## 2018-10-27 DIAGNOSIS — G580 Intercostal neuropathy: Secondary | ICD-10-CM | POA: Diagnosis not present

## 2018-10-27 DIAGNOSIS — F429 Obsessive-compulsive disorder, unspecified: Secondary | ICD-10-CM | POA: Diagnosis not present

## 2018-10-27 DIAGNOSIS — G894 Chronic pain syndrome: Secondary | ICD-10-CM | POA: Diagnosis not present

## 2018-10-27 DIAGNOSIS — I10 Essential (primary) hypertension: Secondary | ICD-10-CM | POA: Diagnosis not present

## 2018-10-27 DIAGNOSIS — M47814 Spondylosis without myelopathy or radiculopathy, thoracic region: Secondary | ICD-10-CM | POA: Diagnosis not present

## 2018-10-27 DIAGNOSIS — Z91048 Other nonmedicinal substance allergy status: Secondary | ICD-10-CM | POA: Diagnosis not present

## 2018-10-28 DIAGNOSIS — I10 Essential (primary) hypertension: Secondary | ICD-10-CM | POA: Diagnosis not present

## 2018-10-28 DIAGNOSIS — K5790 Diverticulosis of intestine, part unspecified, without perforation or abscess without bleeding: Secondary | ICD-10-CM | POA: Diagnosis not present

## 2018-10-28 DIAGNOSIS — M797 Fibromyalgia: Secondary | ICD-10-CM | POA: Diagnosis not present

## 2018-10-28 DIAGNOSIS — J45909 Unspecified asthma, uncomplicated: Secondary | ICD-10-CM | POA: Diagnosis not present

## 2018-10-28 DIAGNOSIS — E119 Type 2 diabetes mellitus without complications: Secondary | ICD-10-CM | POA: Diagnosis not present

## 2018-10-28 DIAGNOSIS — N390 Urinary tract infection, site not specified: Secondary | ICD-10-CM | POA: Diagnosis not present

## 2018-10-29 DIAGNOSIS — I10 Essential (primary) hypertension: Secondary | ICD-10-CM | POA: Diagnosis not present

## 2018-10-29 DIAGNOSIS — J45909 Unspecified asthma, uncomplicated: Secondary | ICD-10-CM | POA: Diagnosis not present

## 2018-10-29 DIAGNOSIS — K5790 Diverticulosis of intestine, part unspecified, without perforation or abscess without bleeding: Secondary | ICD-10-CM | POA: Diagnosis not present

## 2018-10-29 DIAGNOSIS — M797 Fibromyalgia: Secondary | ICD-10-CM | POA: Diagnosis not present

## 2018-10-29 DIAGNOSIS — E119 Type 2 diabetes mellitus without complications: Secondary | ICD-10-CM | POA: Diagnosis not present

## 2018-10-29 DIAGNOSIS — N390 Urinary tract infection, site not specified: Secondary | ICD-10-CM | POA: Diagnosis not present

## 2018-10-30 DIAGNOSIS — N390 Urinary tract infection, site not specified: Secondary | ICD-10-CM | POA: Diagnosis not present

## 2018-10-30 DIAGNOSIS — I10 Essential (primary) hypertension: Secondary | ICD-10-CM | POA: Diagnosis not present

## 2018-10-30 DIAGNOSIS — E119 Type 2 diabetes mellitus without complications: Secondary | ICD-10-CM | POA: Diagnosis not present

## 2018-10-30 DIAGNOSIS — J45909 Unspecified asthma, uncomplicated: Secondary | ICD-10-CM | POA: Diagnosis not present

## 2018-10-30 DIAGNOSIS — K5790 Diverticulosis of intestine, part unspecified, without perforation or abscess without bleeding: Secondary | ICD-10-CM | POA: Diagnosis not present

## 2018-10-30 DIAGNOSIS — M797 Fibromyalgia: Secondary | ICD-10-CM | POA: Diagnosis not present

## 2018-10-30 NOTE — Assessment & Plan Note (Signed)
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 Adjuvant chemotherapy with [place] 2. Followed by adjuvant antiestrogen therapy with anastrozole 5 years No role of radiation since she had mastectomy. ----------------------------------------------------------------------------------------------------------------------- Priortreatment: Completed 5 cycles ofTCH, chemotherapy discontinued for neuropathy.Completed Herceptin maintenance 09/29/2006  Acute Diverticulitis: ED visit 02/22/16 Shingles:Resolved  Plan: 1.Anastrozole 1 mg daily startedin 03/15/16 discontinued 04/25/2016 because of pain in hands and feet,switched her to tamoxifen therapy. But she stopped taking tamoxifen as well. 3. Started letrozole 4/24/2018stopped July 2019, resumed August 2019  CT abdomen and pelvis 07/30/2016: Normal study  Letrozole toxicities:Patient had profound aches and pains and hot flashes and myalgias for 1 month in July and she stopped it. She felt much better after she stopped it. She would like to resume it today.  Emotional distress/body dysmorphic disorder: Hospitalization for abdominal pain June 2020: CT of the abdomen did not reveal any cause.  Treated for UTI  Breast cancer surveillance: 1.Breast exam 05/06/2018: Benign left breast. Right mastectomy. 2.mammogram 07/22/2017: No evidence of malignancy breast density category B, Next mammogram scheduled for 12/05/2018  Return to clinic in1 year for follow-up.

## 2018-10-31 ENCOUNTER — Encounter: Payer: Self-pay | Admitting: Family Medicine

## 2018-10-31 DIAGNOSIS — I10 Essential (primary) hypertension: Secondary | ICD-10-CM

## 2018-10-31 DIAGNOSIS — J45909 Unspecified asthma, uncomplicated: Secondary | ICD-10-CM

## 2018-10-31 DIAGNOSIS — E119 Type 2 diabetes mellitus without complications: Secondary | ICD-10-CM

## 2018-10-31 DIAGNOSIS — E669 Obesity, unspecified: Secondary | ICD-10-CM

## 2018-10-31 DIAGNOSIS — J42 Unspecified chronic bronchitis: Secondary | ICD-10-CM

## 2018-10-31 DIAGNOSIS — N390 Urinary tract infection, site not specified: Secondary | ICD-10-CM

## 2018-10-31 DIAGNOSIS — Z7982 Long term (current) use of aspirin: Secondary | ICD-10-CM

## 2018-10-31 DIAGNOSIS — K5790 Diverticulosis of intestine, part unspecified, without perforation or abscess without bleeding: Secondary | ICD-10-CM

## 2018-10-31 DIAGNOSIS — M797 Fibromyalgia: Secondary | ICD-10-CM

## 2018-10-31 DIAGNOSIS — Z853 Personal history of malignant neoplasm of breast: Secondary | ICD-10-CM

## 2018-11-03 ENCOUNTER — Ambulatory Visit: Payer: Self-pay | Admitting: *Deleted

## 2018-11-03 DIAGNOSIS — E119 Type 2 diabetes mellitus without complications: Secondary | ICD-10-CM | POA: Diagnosis not present

## 2018-11-03 DIAGNOSIS — M797 Fibromyalgia: Secondary | ICD-10-CM | POA: Diagnosis not present

## 2018-11-03 DIAGNOSIS — N390 Urinary tract infection, site not specified: Secondary | ICD-10-CM | POA: Diagnosis not present

## 2018-11-03 DIAGNOSIS — K5790 Diverticulosis of intestine, part unspecified, without perforation or abscess without bleeding: Secondary | ICD-10-CM | POA: Diagnosis not present

## 2018-11-03 DIAGNOSIS — J45909 Unspecified asthma, uncomplicated: Secondary | ICD-10-CM | POA: Diagnosis not present

## 2018-11-03 DIAGNOSIS — I10 Essential (primary) hypertension: Secondary | ICD-10-CM | POA: Diagnosis not present

## 2018-11-03 NOTE — Telephone Encounter (Signed)
Pt checked BP while on the phone it was 148/96 with a pulse of 67.She is waiting for a call back from Dr. Geralyn Flash office to verify if visit is in office or virtual. She will call back and schedule a BP follow up with [place] if she is not see nin the office tomorrow (dr. Lindi Adie)

## 2018-11-03 NOTE — Progress Notes (Signed)
Patient Care Team: Midge Minium, MD as PCP - General [name], MD as Consulting Physician (Cardiology) Marylynn Pearson, MD as Consulting Physician (Ophthalmology) Nicholas Lose, MD as Consulting Physician (Hematology and Oncology) Kyung Rudd, MD as Consulting Physician (Radiation Oncology) Fanny Skates, MD as Consulting Physician (General Surgery) [facility] (Specialist) Suella Broad, MD as Consulting Physician (Physical Medicine and Rehabilitation) Delice Bison, Charlestine Massed, NP as Nurse Practitioner (Hematology and Oncology)  DIAGNOSIS:    ICD-10-CM   1. 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, heterozygous   08/15/2015 Surgery   Right mastectomy: IDC grade 2, 2.2 cm, with associated DCIS intermediate grade, separate focus high-grade DCIS, 0/4 lymph nodes negative, T2 N0 stage II a, ER 90%, PR 5%, HER-2 negative duration 1.42, Ki-67 60%   09/22/2015 - 01/05/2016 Chemotherapy   Adjuvant chemotherapy with TCH 5 cycles followed by Herceptin maintenance for 1 year   03/15/2016 -  Anti-estrogen oral therapy   Anastrozole then tamoxifen and then Letrozole daily (stopped due to side effects) 11/02/17 restarted on 12/03/17 on Letrozole     CHIEF COMPLIANT: Follow-up of right breast cancer on letrozole  INTERVAL HISTORY: Tracey Evans is a 73 y.o. with above-mentioned history of right breast cancer treated with  mastectomy, adjuvant chemotherapy, and who is currently on anti-estrogen therapy with letrozole. I last saw her 6 months ago. She presents to the clinic today for follow-up.   REVIEW OF SYSTEMS:   Constitutional: Denies fevers, chills or abnormal weight loss Eyes: Denies blurriness of vision Ears, nose, mouth, throat, and face: Denies mucositis or sore throat Respiratory: Denies cough, dyspnea or wheezes Cardiovascular: Denies palpitation, chest discomfort Gastrointestinal: Denies nausea, heartburn or change in bowel habits Skin: Denies abnormal skin rashes Lymphatics: Denies new lymphadenopathy or easy bruising Neurological: Denies numbness, tingling or new weaknesses Behavioral/Psych: Mood is stable, no new changes  Extremities: No lower extremity edema Breast: denies any pain or lumps or nodules in either breasts All other systems were reviewed with the patient and are negative.  I have reviewed the past medical history, past surgical history, social history and family history with the patient and they are unchanged from previous note.  ALLERGIES:  is allergic to contrast media [iodinated diagnostic agents]; dilaudid [hydromorphone hcl]; adhesive [tape]; codeine; and lactose intolerance (gi).  MEDICATIONS:  Current Outpatient Medications  Medication Sig Dispense Refill  . albuterol (VENTOLIN HFA) 108 (90 Base) MCG/ACT inhaler Inhale 2 puffs into the lungs every 6 (six) hours as needed for wheezing or shortness of breath. 1 Inhaler 3  . ALPRAZolam (XANAX) 1 MG tablet TAKE 1 TABLET BY MOUTH TWICE A DAY AS NEEDED (Patient taking differently: Take 1 mg by mouth 2 (two) times daily as needed for anxiety. ) 60 tablet 1  . amLODipine (NORVASC) 2.5 MG tablet TAKE 1 TABLET BY MOUTH EVERY DAY 90 tablet 1  . aspirin 81 MG tablet Take 81 mg by mouth daily.    Marland Kitchen atorvastatin (LIPITOR) 20  MG tablet every evening.    Marland Kitchen buPROPion (WELLBUTRIN XL) 150 MG 24 hr tablet TAKE 1 TABLET BY MOUTH EVERY DAY 90  tablet 2  . citalopram (CELEXA) 20 MG tablet Take 1 tablet (20 mg total) by mouth daily. 90 tablet 1  . diphenhydrAMINE (BENADRYL) 50 MG tablet Take one tablet (49ms total) one hour prior to study. 1 tablet 0  . Glycerin-Polysorbate 80 (REFRESH DRY EYE THERAPY OP) Place 1 drop into both eyes 3 (three) times daily.     .Marland Kitchenletrozole (FEMARA) 2.5 MG tablet TAKE 1 TABLET (2.5 MG TOTAL) BY MOUTH DAILY. 90 tablet 1  . levothyroxine (SYNTHROID, LEVOTHROID) 75 MCG tablet TAKE 1 TABLET BY MOUTH EVERY DAY (Patient taking differently: Take 75 mcg by mouth daily before breakfast. ) 90 tablet 1  . meloxicam (MOBIC) 15 MG tablet TAKE 1 TABLET BY MOUTH EVERY DAY (Patient taking differently: Take 15 mg by mouth daily. ) 30 tablet 1  . metoprolol tartrate (LOPRESSOR) 25 MG tablet Take 0.5 tablets (12.5 mg total) by mouth 2 (two) times daily. 60 tablet 0  . nitroGLYCERIN (NITROSTAT) 0.4 MG SL tablet Place 1 tablet (0.4 mg total) under the tongue every 5 (five) minutes as needed. As needed for chest pain (Patient taking differently: Place 0.4 mg under the tongue every 5 (five) minutes as needed for chest pain. ) 30 tablet 3  . pantoprazole (PROTONIX) 40 MG tablet Take 40 mg by mouth daily.     .Marland KitchentiZANidine (ZANAFLEX) 4 MG tablet Take 4 mg by mouth every 8 (eight) hours as needed for muscle spasms.   0   No current facility-administered medications for this visit.     PHYSICAL EXAMINATION: ECOG PERFORMANCE STATUS: 1 - Symptomatic but completely ambulatory  There were no vitals filed for this visit. There were no vitals filed for this visit.  GENERAL: alert, no distress and comfortable SKIN: skin color, texture, turgor are normal, no rashes or significant lesions EYES: normal, Conjunctiva are pink and non-injected, sclera clear OROPHARYNX: no exudate, no erythema and lips, buccal mucosa, and tongue normal  NECK: supple, thyroid normal size, non-tender, without nodularity LYMPH: no palpable lymphadenopathy in  the cervical, axillary or inguinal LUNGS: clear to auscultation and percussion with normal breathing effort HEART: regular rate & rhythm and no murmurs and no lower extremity edema ABDOMEN: abdomen soft, non-tender and normal bowel sounds MUSCULOSKELETAL: no cyanosis of digits and no clubbing  NEURO: alert & oriented x 3 with fluent speech, no focal motor/sensory deficits EXTREMITIES: No lower extremity edema  LABORATORY DATA:  I have reviewed the data as listed CMP Latest Ref Rng & Units 10/17/2018 10/06/2018 10/05/2018  Glucose 70 - 99 mg/dL 100(H) 115(H) 120(H)  BUN 6 - 23 mg/dL 18 19 30(H)  Creatinine 0.40 - 1.20 mg/dL 1.31(H) 1.08(H) 1.45(H)  Sodium 135 - 145 mEq/L 141 137 137  Potassium 3.5 - 5.1 mEq/L 3.8 4.1 4.6  Chloride 96 - 112 mEq/L 104 108 102  CO2 19 - 32 mEq/L '28 22 24  ' Calcium 8.4 - 10.5 mg/dL 9.4 8.5(L) 9.2  Total Protein 6.5 - 8.1 g/dL - 6.3(L) 7.4  Total Bilirubin 0.3 - 1.2 mg/dL - 0.7 1.3(H)  Alkaline Phos 38 - 126 U/L - 93 102  AST 15 - 41 U/L - 21 27  ALT 0 - 44 U/L - 20 22    Lab Results  Component Value Date   WBC 7.5 10/17/2018   HGB 12.1 10/17/2018   HCT 37.3 10/17/2018  MCV 92.2 10/17/2018   PLT 410.0 (H) 10/17/2018   NEUTROABS 3.9 10/17/2018    ASSESSMENT & PLAN:  Breast cancer of lower-outer quadrant of right female breast ([place]) Right mastectomy 08/15/2015: IDC grade 2, 2.2 cm, with associated DCIS intermediate grade, separate focus high-grade DCIS, 0/4 lymph nodes negative, T2 N0 stage II a, ER 90%, PR 5%, HER-2 negative duration 1.42, Ki-67 60%  Treatment plan: 1. Adjuvant chemotherapy with [place] 2. Followed by adjuvant antiestrogen therapy with anastrozole 5 years No role of radiation since she had mastectomy. ----------------------------------------------------------------------------------------------------------------------- Priortreatment: Completed 5 cycles ofTCH, chemotherapy discontinued for neuropathy.Completed Herceptin  maintenance 09/29/2006  Acute Diverticulitis: ED visit 02/22/16 Shingles:Resolved  Plan: 1.Anastrozole 1 mg daily startedin 03/15/16 discontinued 04/25/2016 because of pain in hands and feet,switched her to tamoxifen therapy. But she stopped taking tamoxifen as well. 3. Started letrozole 4/24/2018stopped July 2019, resumed August 2019  CT abdomen and pelvis 07/30/2016: Normal study  Letrozole toxicities:Patient had profound aches and pains and hot flashes and myalgias for 1 month in July and she stopped it. She felt much better after she stopped it. She would like to resume it today.  Emotional distress/body dysmorphic disorder: Hospitalization for abdominal pain June 2020: CT of the abdomen did not reveal any cause.  Treated for UTI  Breast cancer surveillance: 1.Breast exam 05/06/2018: Benign left breast. Right mastectomy. 2.mammogram 07/22/2017: No evidence of malignancy breast density category B, Next mammogram scheduled for 12/05/2018 Patient is moving to [place] to [place] but she would like to keep her follow-ups here. Return to clinic in1 year for follow-up and we will do mammogram at the same day.    No orders of the defined types were placed in this encounter.  The patient has a good understanding of the overall plan. she agrees with it. she will call with any problems that may develop before the next visit here.  Nicholas Lose, MD 11/04/2018  Julious Oka Dorshimer am acting as scribe for Dr. Nicholas Lose.  I have reviewed the above documentation for accuracy and completeness, and I agree with the above.

## 2018-11-03 NOTE — Telephone Encounter (Signed)
Pt has appt w/ Dr Lindi Adie tomorrow.  If this is in-office, we can see what her BP is at that time.  If not in office, I want her to monitor her BP at home.  I suspect this elevation is stress related.  We can schedule her an in-office appt if she is interested to assess

## 2018-11-03 NOTE — Telephone Encounter (Signed)
Eustaquio Maize is calling to report patient has high BP this morning while she was there to do PT. Patient has just gotten off the phone with family member about another member who they are calling in Hospice for.  Upon arrival BP reading: 166/110 and 164/110. Before leaving: 160/100. Patient is taking all her medication and has no symptoms. Patient is also getting ready to move from her dwelling and she is getting ready for that. Patient has had a lot of changes.   BP now is 158/100 P 66, 152/120 P 64 Call to office for appointment and could not get through- both numbers. Message sent over for review and patient call back. Patient did recheck again while waiting and reports BP- 156/90 P 63  Reason for Disposition . Systolic BP  >= 433 OR Diastolic >= 295  Answer Assessment - Initial Assessment Questions 1. BLOOD PRESSURE: "What is the blood pressure?" "Did you take at least two measurements 5 minutes apart?"     158/100 2. ONSET: "When did you take your blood pressure?"     11:28 3. HOW: "How did you obtain the blood pressure?" (e.g., visiting nurse, automatic home BP monitor)     Automatic cuff 4. HISTORY: "Do you have a history of high blood pressure?"     yes 5. MEDICATIONS: "Are you taking any medications for blood pressure?" "Have you missed any doses recently?"     Yes- no missed doses 6. OTHER SYMPTOMS: "Do you have any symptoms?" (e.g., headache, chest pain, blurred vision, difficulty breathing, weakness)     No symptoms 7. PREGNANCY: "Is there any chance you are pregnant?" "When was your last menstrual period?"     n/a  Protocols used: HIGH BLOOD PRESSURE-A-AH

## 2018-11-03 NOTE — Telephone Encounter (Signed)
FYI

## 2018-11-04 ENCOUNTER — Inpatient Hospital Stay: Payer: Medicare Other | Attending: Hematology and Oncology | Admitting: Hematology and Oncology

## 2018-11-04 ENCOUNTER — Other Ambulatory Visit: Payer: Self-pay

## 2018-11-04 DIAGNOSIS — C50511 Malignant neoplasm of lower-outer quadrant of right female breast: Secondary | ICD-10-CM

## 2018-11-04 DIAGNOSIS — Z170 Estrogen receptor positive status [ER+]: Secondary | ICD-10-CM

## 2018-11-04 DIAGNOSIS — Z79811 Long term (current) use of aromatase inhibitors: Secondary | ICD-10-CM

## 2018-11-04 MED ORDER — LETROZOLE 2.5 MG PO TABS: 2.5000 mg | ORAL_TABLET | Freq: Every day | ORAL | 3 refills | Status: DC

## 2018-11-05 ENCOUNTER — Telehealth: Payer: Self-pay | Admitting: Hematology and Oncology

## 2018-11-05 NOTE — Telephone Encounter (Signed)
I talk with patient regarding schedule  

## 2018-11-10 DIAGNOSIS — H04123 Dry eye syndrome of bilateral lacrimal glands: Secondary | ICD-10-CM | POA: Diagnosis not present

## 2018-11-10 DIAGNOSIS — H40042 Steroid responder, left eye: Secondary | ICD-10-CM | POA: Diagnosis not present

## 2018-11-10 DIAGNOSIS — M25562 Pain in left knee: Secondary | ICD-10-CM | POA: Diagnosis not present

## 2018-11-10 DIAGNOSIS — M549 Dorsalgia, unspecified: Secondary | ICD-10-CM | POA: Diagnosis not present

## 2018-11-10 DIAGNOSIS — M25561 Pain in right knee: Secondary | ICD-10-CM | POA: Diagnosis not present

## 2018-11-10 DIAGNOSIS — G588 Other specified mononeuropathies: Secondary | ICD-10-CM | POA: Diagnosis not present

## 2018-11-10 DIAGNOSIS — G894 Chronic pain syndrome: Secondary | ICD-10-CM | POA: Diagnosis not present

## 2018-11-10 DIAGNOSIS — G8929 Other chronic pain: Secondary | ICD-10-CM | POA: Diagnosis not present

## 2018-11-10 DIAGNOSIS — H20012 Primary iridocyclitis, left eye: Secondary | ICD-10-CM | POA: Diagnosis not present

## 2018-11-15 ENCOUNTER — Other Ambulatory Visit: Payer: Self-pay | Admitting: Family Medicine

## 2018-11-17 NOTE — Telephone Encounter (Signed)
Last OV 10/14/18 Alprazolam last filled 08/26/18 #60 with 1

## 2018-11-25 DIAGNOSIS — I10 Essential (primary) hypertension: Secondary | ICD-10-CM | POA: Diagnosis not present

## 2018-11-25 DIAGNOSIS — M1990 Unspecified osteoarthritis, unspecified site: Secondary | ICD-10-CM | POA: Diagnosis not present

## 2018-11-25 DIAGNOSIS — E785 Hyperlipidemia, unspecified: Secondary | ICD-10-CM | POA: Diagnosis not present

## 2018-11-25 DIAGNOSIS — E039 Hypothyroidism, unspecified: Secondary | ICD-10-CM | POA: Diagnosis not present

## 2018-11-25 DIAGNOSIS — M797 Fibromyalgia: Secondary | ICD-10-CM | POA: Diagnosis not present

## 2018-11-25 DIAGNOSIS — I2510 Atherosclerotic heart disease of native coronary artery without angina pectoris: Secondary | ICD-10-CM | POA: Diagnosis not present

## 2018-12-04 ENCOUNTER — Other Ambulatory Visit: Payer: Self-pay | Admitting: General Practice

## 2018-12-04 MED ORDER — LEVOTHYROXINE SODIUM 75 MCG PO TABS: 75.0000 ug | ORAL_TABLET | Freq: Every day | ORAL | 0 refills | Status: DC

## 2018-12-05 ENCOUNTER — Ambulatory Visit: Payer: Medicare Other | Admitting: Hematology and Oncology

## 2018-12-05 ENCOUNTER — Other Ambulatory Visit: Payer: Self-pay

## 2018-12-05 ENCOUNTER — Ambulatory Visit
Admission: RE | Admit: 2018-12-05 | Discharge: 2018-12-05 | Disposition: A | Discharge status: Home / Self Care | Payer: Medicare Other | Source: Ambulatory Visit | Attending: Hematology and Oncology | Admitting: Hematology and Oncology

## 2018-12-05 DIAGNOSIS — Z1231 Encounter for screening mammogram for malignant neoplasm of breast: Secondary | ICD-10-CM

## 2018-12-11 ENCOUNTER — Ambulatory Visit (INDEPENDENT_AMBULATORY_CARE_PROVIDER_SITE_OTHER): Payer: Medicare Other | Admitting: Family Medicine

## 2018-12-11 ENCOUNTER — Encounter: Payer: Self-pay | Admitting: Family Medicine

## 2018-12-11 ENCOUNTER — Other Ambulatory Visit: Payer: Self-pay

## 2018-12-11 DIAGNOSIS — E785 Hyperlipidemia, unspecified: Secondary | ICD-10-CM | POA: Diagnosis not present

## 2018-12-11 DIAGNOSIS — I10 Essential (primary) hypertension: Secondary | ICD-10-CM | POA: Diagnosis not present

## 2018-12-11 DIAGNOSIS — E1169 Type 2 diabetes mellitus with other specified complication: Secondary | ICD-10-CM

## 2018-12-11 DIAGNOSIS — E119 Type 2 diabetes mellitus without complications: Secondary | ICD-10-CM

## 2018-12-11 DIAGNOSIS — F331 Major depressive disorder, recurrent, moderate: Secondary | ICD-10-CM

## 2018-12-11 NOTE — Progress Notes (Signed)
I have discussed the procedure for the virtual visit with the patient who has given consent to proceed with assessment and treatment.   Pt unable to obtain vitals.   Ikechukwu Cerny L Barbee Mamula, CMA     

## 2018-12-11 NOTE — Progress Notes (Signed)
Virtual Visit via Video   I connected with patient on 12/11/18 at  9:30 AM EDT by a video enabled telemedicine application and verified that I am speaking with the correct person using two identifiers.  Location patient: Home Location provider: Acupuncturist, Office Persons participating in the virtual visit: Patient, Provider, [place] (Jess B)  I discussed the limitations of evaluation and management by telemedicine and the availability of in person appointments. The patient expressed understanding and agreed to proceed.  Subjective:   HPI:   HTN- chronic problem, on Amlodipine 5 mg daily, Metoprolol 25mg  1/2 tab BID.  Denies CP, SOB, HAs, visual changes, edema.  Hyperlipidemia- chronic problem, on Atorvastatin 20mg  daily.  Denies abd pain, N/V.  DM- chronic problem.  Currently diet controlled.  Due for foot exam- unable to do virtually.  UTD on eye exam.  Due for microalbumin since she stopped ACE/ARB.  No longer craving sugar, has changed diet.   Depression- ongoing issue for pt.  Currently on Celexa 20mg  daily.  Decreased appetite, not eating.  Recently moved to [place] to be w/ daughter.  Is still grieving sister's death.  Is glad she has relocated but move was stressful.  'I really like the apartment'.  ROS:   See pertinent positives and negatives per HPI.  Patient Active Problem List   Diagnosis Date Noted  . Recurrent falls 10/14/2018  . UTI (urinary tract infection) 10/05/2018  . Hypotension 10/05/2018  . AKI (acute kidney injury) ([place]) 10/05/2018  . Cervical disc disease 05/02/2017  . Anemia associated with nutritional deficiency 01/13/2016  . Syncope 10/05/2015  . Genetic testing 08/21/2015  . Family history of breast cancer in female 07/15/2015  . Family history of colon cancer 07/15/2015  . Breast cancer of lower-outer quadrant of right female breast ([place]) 07/08/2015  . Disturbance in sleep behavior 11/18/2014  . Fibromyalgia 10/04/2014  . Diverticulitis  of colon (without mention of hemorrhage)(562.11) 12/01/2013  . Dry mouth 04/29/2013  . Dry eye 04/29/2013  . Obesity (BMI 30-39.9) 03/11/2013  . Edema 09/04/2012  . Depression 09/04/2012  . Chemotherapy-induced peripheral neuropathy ([place]) 05/12/2012  . OAB (overactive bladder) 04/01/2012  . DM w/o complication type II ([place]) 02/06/2012  . Allergy to dogs 04/19/2011  . Polyarthralgia 09/18/2010  . STRESS INCONTINENCE 04/25/2010  . Asthma 04/11/2010  . VERTIGO 04/11/2010  . Hypothyroidism 01/19/2010  . Hyperlipidemia associated with type 2 diabetes mellitus ([place]) 01/19/2010  . Essential hypertension 01/19/2010  . GERD 01/19/2010  . PEPTIC ULCER DISEASE 01/19/2010  . OSTEOARTHRITIS 01/19/2010  . LOW BACK PAIN 01/19/2010    Social History   Tobacco Use  . Smoking status: Never Smoker  . Smokeless tobacco: Never Used  Substance Use Topics  . Alcohol use: Not Currently    Alcohol/week: 0.0 standard drinks    Comment: 08/15/2015 "1/2 glass of wine q 2 months"     Current Outpatient Medications:  .  albuterol (VENTOLIN HFA) 108 (90 Base) MCG/ACT inhaler, Inhale 2 puffs into the lungs every 6 (six) hours as needed for wheezing or shortness of breath., Disp: 1 Inhaler, Rfl: 3 .  ALPRAZolam (XANAX) 1 MG tablet, TAKE 1 TABLET BY MOUTH TWICE A DAY AS NEEDED, Disp: 60 tablet, Rfl: 1 .  amLODipine (NORVASC) 2.5 MG tablet, TAKE 1 TABLET BY MOUTH EVERY DAY, Disp: 90 tablet, Rfl: 1 .  amLODipine (NORVASC) 5 MG tablet, Take 5 mg by mouth daily., Disp: , Rfl:  .  aspirin 81 MG tablet, Take 81 mg by  mouth daily., Disp: , Rfl:  .  atorvastatin (LIPITOR) 20 MG tablet, TAKE 1 TABLET BY MOUTH EVERY DAY IN THE EVENING, Disp: 90 tablet, Rfl: 1 .  buPROPion (WELLBUTRIN XL) 150 MG 24 hr tablet, TAKE 1 TABLET BY MOUTH EVERY DAY, Disp: 90 tablet, Rfl: 2 .  citalopram (CELEXA) 20 MG tablet, Take 1 tablet (20 mg total) by mouth daily., Disp: 90 tablet, Rfl: 1 .  diclofenac sodium (VOLTAREN) 1 % GEL, PLACE ONTO  THE SKIN 4 (FOUR) TIMES A DAY AS NEEDED., Disp: , Rfl:  .  diphenhydrAMINE (BENADRYL) 50 MG tablet, Take one tablet (50mg s total) one hour prior to study., Disp: 1 tablet, Rfl: 0 .  Glycerin-Polysorbate 80 (REFRESH DRY EYE THERAPY OP), Place 1 drop into both eyes 3 (three) times daily. , Disp: , Rfl:  .  letrozole (FEMARA) 2.5 MG tablet, Take 1 tablet (2.5 mg total) by mouth daily., Disp: 90 tablet, Rfl: 3 .  levothyroxine (SYNTHROID) 75 MCG tablet, Take 1 tablet (75 mcg total) by mouth daily before breakfast., Disp: 90 tablet, Rfl: 0 .  metoprolol tartrate (LOPRESSOR) 25 MG tablet, Take 0.5 tablets (12.5 mg total) by mouth 2 (two) times daily., Disp: 60 tablet, Rfl: 0 .  nitroGLYCERIN (NITROSTAT) 0.4 MG SL tablet, Place 1 tablet (0.4 mg total) under the tongue every 5 (five) minutes as needed. As needed for chest pain (Patient taking differently: Place 0.4 mg under the tongue every 5 (five) minutes as needed for chest pain. ), Disp: 30 tablet, Rfl: 3 .  pantoprazole (PROTONIX) 40 MG tablet, Take 40 mg by mouth daily. , Disp: , Rfl:  .  tiZANidine (ZANAFLEX) 4 MG tablet, Take 4 mg by mouth every 8 (eight) hours as needed for muscle spasms. , Disp: , Rfl: 0  Allergies  Allergen Reactions  . Contrast Media [Iodinated Diagnostic Agents] Shortness Of Breath    Patient has SOB, tightness in chest and feels like an elephant is sitting on chest, patient should be  scanned at hospital Per Dr. Alvester Chou  . Dilaudid [Hydromorphone Hcl] Anaphylaxis    Pt stopped breathing  . Adhesive [Tape]     blister  . Codeine Nausea Only    insomnia  . Lactose Intolerance (Gi) Diarrhea    Objective:   There were no vitals taken for this visit. AAOx3, NAD NCAT, EOMI No obvious CN deficits Coloring WNL Pt is able to speak clearly, coherently without shortness of breath or increased work of breathing.  Thought process is linear.  Mood is appropriate.   Assessment and Plan:   HTN- chronic problem, unable to check  BP today.  Asymptomatic.  Pt will continue her care locally in [place].  DM- chronic problem, UTD on eye exam.  Due for foot exam and microalbumin but unable to perform as pt moved to [place] on 9/1.  She reports she is not eating sweets- I am very proud of her.  Encouraged her to find local MD in [place] to continue her care.  Hyperlipidemia- chronic problem.  Tolerating statin w/o difficulty.  Has decreased eating due to stress of moving but pt promises she will be aware of this and continue to eat regularly.  Depression- pt feels this is situational w/ her recent move but that overall she is happy w/ her relocation and knows it was the right decision.  No med changes at this time.  Encouraged her to find a new provider locally so they can continue to provide care   Annye Asa,  MD 12/11/2018

## 2018-12-19 DIAGNOSIS — Z23 Encounter for immunization: Secondary | ICD-10-CM | POA: Diagnosis not present

## 2018-12-28 ENCOUNTER — Other Ambulatory Visit: Payer: Self-pay | Admitting: Family Medicine

## 2019-01-15 ENCOUNTER — Other Ambulatory Visit: Payer: Self-pay | Admitting: Family Medicine

## 2019-01-15 NOTE — Telephone Encounter (Signed)
Last OV 12/11/18 Alprazolam last filled 11/17/18 #60 with 1

## 2019-01-26 ENCOUNTER — Telehealth: Payer: Self-pay | Admitting: Family Medicine

## 2019-01-26 MED ORDER — LEVOTHYROXINE SODIUM 75 MCG PO TABS: 75.0000 ug | ORAL_TABLET | Freq: Every day | ORAL | 0 refills | Status: DC

## 2019-01-26 NOTE — Telephone Encounter (Signed)
Medication filled to pharmacy as requested.   

## 2019-01-26 NOTE — Telephone Encounter (Signed)
Pt called in asking for new script of the Levothyroxine 75 MCG. She needs this to go to [facility] on [address] in [facility]   Phone # is [phone]  She said tell Birdie Riddle she misses her and kiss kiss

## 2019-01-26 NOTE — Telephone Encounter (Signed)
I miss her, too!

## 2019-02-28 DIAGNOSIS — M1990 Unspecified osteoarthritis, unspecified site: Secondary | ICD-10-CM | POA: Diagnosis not present

## 2019-02-28 DIAGNOSIS — M545 Low back pain: Secondary | ICD-10-CM | POA: Diagnosis not present

## 2019-03-12 ENCOUNTER — Other Ambulatory Visit: Payer: Self-pay | Admitting: Family Medicine

## 2019-03-12 NOTE — Telephone Encounter (Signed)
Last OV 12/11/18 Alprazolam last filled 01/16/19 #60 with 0

## 2019-03-20 ENCOUNTER — Other Ambulatory Visit: Payer: Self-pay | Admitting: Family Medicine

## 2019-03-20 NOTE — Telephone Encounter (Signed)
Last OV 12/11/18 Alprazolam last filled 03/12/19 #60 with 0

## 2019-04-13 ENCOUNTER — Other Ambulatory Visit: Payer: Self-pay | Admitting: Family Medicine

## 2019-04-14 NOTE — Telephone Encounter (Signed)
Last OV 12/11/18 Alprazolam last filled 12/3/0 #60 with 0

## 2019-04-17 ENCOUNTER — Other Ambulatory Visit: Payer: Self-pay | Admitting: Family Medicine

## 2019-04-20 DIAGNOSIS — F419 Anxiety disorder, unspecified: Secondary | ICD-10-CM | POA: Diagnosis not present

## 2019-05-07 DIAGNOSIS — G8929 Other chronic pain: Secondary | ICD-10-CM | POA: Diagnosis not present

## 2019-05-07 DIAGNOSIS — E785 Hyperlipidemia, unspecified: Secondary | ICD-10-CM | POA: Diagnosis not present

## 2019-05-07 DIAGNOSIS — Z853 Personal history of malignant neoplasm of breast: Secondary | ICD-10-CM | POA: Insufficient documentation

## 2019-05-07 DIAGNOSIS — Z9011 Acquired absence of right breast and nipple: Secondary | ICD-10-CM | POA: Diagnosis not present

## 2019-05-07 DIAGNOSIS — F341 Dysthymic disorder: Secondary | ICD-10-CM | POA: Diagnosis not present

## 2019-05-07 DIAGNOSIS — F329 Major depressive disorder, single episode, unspecified: Secondary | ICD-10-CM | POA: Diagnosis not present

## 2019-05-07 DIAGNOSIS — Z79899 Other long term (current) drug therapy: Secondary | ICD-10-CM | POA: Diagnosis not present

## 2019-05-07 DIAGNOSIS — E039 Hypothyroidism, unspecified: Secondary | ICD-10-CM | POA: Diagnosis not present

## 2019-05-07 DIAGNOSIS — E669 Obesity, unspecified: Secondary | ICD-10-CM | POA: Diagnosis not present

## 2019-05-07 DIAGNOSIS — E119 Type 2 diabetes mellitus without complications: Secondary | ICD-10-CM | POA: Diagnosis not present

## 2019-05-07 DIAGNOSIS — I10 Essential (primary) hypertension: Secondary | ICD-10-CM | POA: Diagnosis not present

## 2019-05-09 ENCOUNTER — Other Ambulatory Visit: Payer: Self-pay | Admitting: Family Medicine

## 2019-06-19 ENCOUNTER — Telehealth: Payer: Self-pay | Admitting: *Deleted

## 2019-06-19 NOTE — Telephone Encounter (Signed)
Received VM from pt.  Attempt x1 to return call, no answer.  LVM to return call to the office.

## 2019-07-08 ENCOUNTER — Other Ambulatory Visit: Payer: Self-pay | Admitting: Family Medicine

## 2019-07-21 ENCOUNTER — Other Ambulatory Visit: Payer: Self-pay | Admitting: Family Medicine

## 2019-08-05 ENCOUNTER — Other Ambulatory Visit: Payer: Self-pay | Admitting: Family Medicine

## 2019-08-05 DIAGNOSIS — G8929 Other chronic pain: Secondary | ICD-10-CM | POA: Diagnosis not present

## 2019-08-05 DIAGNOSIS — E785 Hyperlipidemia, unspecified: Secondary | ICD-10-CM | POA: Diagnosis not present

## 2019-08-05 DIAGNOSIS — F329 Major depressive disorder, single episode, unspecified: Secondary | ICD-10-CM | POA: Diagnosis not present

## 2019-08-05 DIAGNOSIS — I10 Essential (primary) hypertension: Secondary | ICD-10-CM | POA: Diagnosis not present

## 2019-08-05 DIAGNOSIS — E039 Hypothyroidism, unspecified: Secondary | ICD-10-CM | POA: Diagnosis not present

## 2019-08-11 DIAGNOSIS — N3946 Mixed incontinence: Secondary | ICD-10-CM | POA: Diagnosis not present

## 2019-08-11 DIAGNOSIS — Z79899 Other long term (current) drug therapy: Secondary | ICD-10-CM | POA: Diagnosis not present

## 2019-08-11 DIAGNOSIS — R319 Hematuria, unspecified: Secondary | ICD-10-CM | POA: Diagnosis not present

## 2019-08-17 DIAGNOSIS — H52223 Regular astigmatism, bilateral: Secondary | ICD-10-CM | POA: Diagnosis not present

## 2019-08-17 DIAGNOSIS — H524 Presbyopia: Secondary | ICD-10-CM | POA: Diagnosis not present

## 2019-08-17 DIAGNOSIS — H5201 Hypermetropia, right eye: Secondary | ICD-10-CM | POA: Diagnosis not present

## 2019-08-17 DIAGNOSIS — Z961 Presence of intraocular lens: Secondary | ICD-10-CM | POA: Diagnosis not present

## 2019-08-20 DIAGNOSIS — K219 Gastro-esophageal reflux disease without esophagitis: Secondary | ICD-10-CM | POA: Diagnosis not present

## 2019-08-20 DIAGNOSIS — I10 Essential (primary) hypertension: Secondary | ICD-10-CM | POA: Diagnosis not present

## 2019-08-20 DIAGNOSIS — I499 Cardiac arrhythmia, unspecified: Secondary | ICD-10-CM | POA: Diagnosis not present

## 2019-08-20 DIAGNOSIS — J45909 Unspecified asthma, uncomplicated: Secondary | ICD-10-CM | POA: Diagnosis not present

## 2019-08-20 DIAGNOSIS — M797 Fibromyalgia: Secondary | ICD-10-CM | POA: Diagnosis not present

## 2019-09-03 DIAGNOSIS — Z6834 Body mass index (BMI) 34.0-34.9, adult: Secondary | ICD-10-CM | POA: Diagnosis not present

## 2019-09-03 DIAGNOSIS — M797 Fibromyalgia: Secondary | ICD-10-CM | POA: Diagnosis not present

## 2019-09-03 DIAGNOSIS — M170 Bilateral primary osteoarthritis of knee: Secondary | ICD-10-CM | POA: Diagnosis not present

## 2019-09-03 DIAGNOSIS — M1712 Unilateral primary osteoarthritis, left knee: Secondary | ICD-10-CM | POA: Diagnosis not present

## 2019-09-03 DIAGNOSIS — M2550 Pain in unspecified joint: Secondary | ICD-10-CM | POA: Diagnosis not present

## 2019-09-03 DIAGNOSIS — M5416 Radiculopathy, lumbar region: Secondary | ICD-10-CM | POA: Diagnosis not present

## 2019-09-03 DIAGNOSIS — E669 Obesity, unspecified: Secondary | ICD-10-CM | POA: Diagnosis not present

## 2019-09-03 DIAGNOSIS — M25562 Pain in left knee: Secondary | ICD-10-CM | POA: Diagnosis not present

## 2019-09-03 DIAGNOSIS — R208 Other disturbances of skin sensation: Secondary | ICD-10-CM | POA: Diagnosis not present

## 2019-09-03 DIAGNOSIS — R5381 Other malaise: Secondary | ICD-10-CM | POA: Diagnosis not present

## 2019-09-05 DIAGNOSIS — R05 Cough: Secondary | ICD-10-CM | POA: Diagnosis not present

## 2019-09-14 ENCOUNTER — Telehealth: Payer: Self-pay | Admitting: *Deleted

## 2019-09-14 DIAGNOSIS — G8929 Other chronic pain: Secondary | ICD-10-CM | POA: Diagnosis not present

## 2019-09-14 NOTE — Telephone Encounter (Signed)
Received call from pt with complaint of increased bone and joint pain while on Letrozole.  Per MD pt to stop taking Letrozole x 2 weeks to see if symptoms resolve. Pt verbalized understanding and states she will keep the office updated.

## 2019-09-21 ENCOUNTER — Other Ambulatory Visit: Payer: Self-pay | Admitting: Hematology and Oncology

## 2019-09-21 DIAGNOSIS — Z1231 Encounter for screening mammogram for malignant neoplasm of breast: Secondary | ICD-10-CM

## 2019-09-24 DIAGNOSIS — Z79899 Other long term (current) drug therapy: Secondary | ICD-10-CM | POA: Diagnosis not present

## 2019-09-24 DIAGNOSIS — N3946 Mixed incontinence: Secondary | ICD-10-CM | POA: Diagnosis not present

## 2019-09-24 DIAGNOSIS — N3281 Overactive bladder: Secondary | ICD-10-CM | POA: Diagnosis not present

## 2019-10-22 DIAGNOSIS — Z1331 Encounter for screening for depression: Secondary | ICD-10-CM | POA: Diagnosis not present

## 2019-10-22 DIAGNOSIS — Z0000 Encounter for general adult medical examination without abnormal findings: Secondary | ICD-10-CM | POA: Diagnosis not present

## 2019-10-26 DIAGNOSIS — Z23 Encounter for immunization: Secondary | ICD-10-CM | POA: Diagnosis not present

## 2019-10-26 DIAGNOSIS — E119 Type 2 diabetes mellitus without complications: Secondary | ICD-10-CM | POA: Diagnosis not present

## 2019-10-26 DIAGNOSIS — E039 Hypothyroidism, unspecified: Secondary | ICD-10-CM | POA: Diagnosis not present

## 2019-10-26 DIAGNOSIS — I10 Essential (primary) hypertension: Secondary | ICD-10-CM | POA: Diagnosis not present

## 2019-10-26 DIAGNOSIS — E785 Hyperlipidemia, unspecified: Secondary | ICD-10-CM | POA: Diagnosis not present

## 2019-11-02 DIAGNOSIS — Z9011 Acquired absence of right breast and nipple: Secondary | ICD-10-CM | POA: Diagnosis not present

## 2019-11-02 DIAGNOSIS — Q245 Malformation of coronary vessels: Secondary | ICD-10-CM | POA: Diagnosis not present

## 2019-11-02 DIAGNOSIS — Z853 Personal history of malignant neoplasm of breast: Secondary | ICD-10-CM | POA: Diagnosis not present

## 2019-11-02 DIAGNOSIS — I10 Essential (primary) hypertension: Secondary | ICD-10-CM | POA: Diagnosis not present

## 2019-11-02 DIAGNOSIS — E119 Type 2 diabetes mellitus without complications: Secondary | ICD-10-CM | POA: Diagnosis not present

## 2019-11-02 DIAGNOSIS — F329 Major depressive disorder, single episode, unspecified: Secondary | ICD-10-CM | POA: Diagnosis not present

## 2019-11-02 DIAGNOSIS — E785 Hyperlipidemia, unspecified: Secondary | ICD-10-CM | POA: Diagnosis not present

## 2019-11-02 DIAGNOSIS — G8929 Other chronic pain: Secondary | ICD-10-CM | POA: Diagnosis not present

## 2019-11-02 DIAGNOSIS — E669 Obesity, unspecified: Secondary | ICD-10-CM | POA: Diagnosis not present

## 2019-11-02 DIAGNOSIS — E039 Hypothyroidism, unspecified: Secondary | ICD-10-CM | POA: Diagnosis not present

## 2019-11-30 DIAGNOSIS — M5431 Sciatica, right side: Secondary | ICD-10-CM | POA: Diagnosis not present

## 2019-12-06 NOTE — Progress Notes (Signed)
Patient Care Team: Patient, No Pcp Per as PCP - General (General Practice) [name], MD as Consulting Physician (Cardiology) Marylynn Pearson, MD as Consulting Physician (Ophthalmology) Nicholas Lose, MD as Consulting Physician (Hematology and Oncology) Kyung Rudd, MD as Consulting Physician (Radiation Oncology) Fanny Skates, MD as Consulting Physician (General Surgery) [facility], [facility] (Specialist) Suella Broad, MD as Consulting Physician (Physical Medicine and Rehabilitation) Delice Bison, Charlestine Massed, NP as Nurse Practitioner (Hematology and Oncology)  DIAGNOSIS:    ICD-10-CM   1. 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, heterozygous   08/15/2015 Surgery   Right mastectomy: IDC grade 2, 2.2 cm, with associated DCIS intermediate grade, separate focus high-grade DCIS, 0/4 lymph nodes negative, T2 N0 stage II a, ER 90%, PR 5%, HER-2 negative duration 1.42, Ki-67 60%   09/22/2015 - 01/05/2016 Chemotherapy   Adjuvant chemotherapy with TCH 5 cycles followed by Herceptin maintenance for 1 year   03/15/2016 -  Anti-estrogen oral therapy   Anastrozole then tamoxifen and then Letrozole daily (stopped due to side effects) 11/02/17 restarted on 12/03/17 on Letrozole     CHIEF COMPLIANT: Follow-up of right breast cancer on letrozole  INTERVAL HISTORY: Tracey Evans is a 74 y.o. with above-mentioned history of right breast cancer  treated with mastectomy, adjuvant chemotherapy, and who was on anti-estrogen therapy with letrozole.   She stopped letrozole 3 months ago because of muscle aches and pains and is here today to discuss her treatment plan.  Mammogram on 12/05/18 showed no evidence of malignancy bilaterally. She presents to the clinic today for follow-up.   ALLERGIES:  is allergic to contrast media [iodinated diagnostic agents], dilaudid [hydromorphone hcl], adhesive [tape], codeine, and lactose intolerance (gi).  MEDICATIONS:  Current Outpatient Medications  Medication Sig Dispense Refill  . albuterol (VENTOLIN HFA) 108 (90 Base) MCG/ACT inhaler Inhale 2 puffs into the lungs every 6 (six) hours as needed for wheezing or shortness of breath. 1 Inhaler 3  . ALPRAZolam (XANAX) 1 MG tablet TAKE 1 TABLET BY MOUTH TWICE A DAY AS NEEDED 60 tablet 0  . amLODipine (NORVASC) 5 MG tablet Take 5 mg by mouth daily.    Marland Kitchen aspirin 81 MG tablet Take 81 mg by mouth daily.    Marland Kitchen atorvastatin (LIPITOR) 20 MG tablet TAKE 1 TABLET BY MOUTH EVERY DAY IN THE EVENING 90 tablet 1  . buPROPion (WELLBUTRIN XL) 150 MG 24 hr tablet TAKE 1 TABLET BY MOUTH EVERY DAY 90 tablet 2  . citalopram (CELEXA) 20 MG tablet TAKE 1 TABLET BY MOUTH EVERY DAY 90 tablet 1  . diclofenac sodium (VOLTAREN) 1 % GEL PLACE ONTO THE SKIN 4 (FOUR) TIMES A DAY AS NEEDED.    Marland Kitchen diphenhydrAMINE (BENADRYL) 50 MG tablet Take one tablet (41ms total) one hour prior to study. 1 tablet 0  . Glycerin-Polysorbate 80 (REFRESH DRY EYE THERAPY OP) Place 1 drop into both eyes 3 (three) times daily.     .Marland Kitchenletrozole (FEMARA) 2.5 MG tablet  Take 1 tablet (2.5 mg total) by mouth daily. 90 tablet 3  . levothyroxine (SYNTHROID) 75 MCG tablet Take 1 tablet (75 mcg total) by mouth daily before breakfast. 90 tablet 0  . metoprolol tartrate (LOPRESSOR) 25 MG tablet Take 0.5 tablets (12.5 mg total) by mouth 2 (two) times daily. 60 tablet 0  . nitroGLYCERIN (NITROSTAT) 0.4 MG SL tablet Place 1  tablet (0.4 mg total) under the tongue every 5 (five) minutes as needed. As needed for chest pain (Patient taking differently: Place 0.4 mg under the tongue every 5 (five) minutes as needed for chest pain. ) 30 tablet 3  . pantoprazole (PROTONIX) 40 MG tablet Take 40 mg by mouth daily.     Marland Kitchen tiZANidine (ZANAFLEX) 4 MG tablet Take 4 mg by mouth every 8 (eight) hours as needed for muscle spasms.   0   No current facility-administered medications for this visit.    PHYSICAL EXAMINATION: ECOG PERFORMANCE STATUS: 1 - Symptomatic but completely ambulatory  Vitals:   12/07/19 1138  BP: (!) 149/92  Pulse: 85  Resp: 18  Temp: 98 F (36.7 C)  SpO2: 97%   Filed Weights   12/07/19 1138  Weight: 202 lb 9.6 oz (91.9 kg)    BREAST: No palpable masses or nodules in either right or left breasts. No palpable axillary supraclavicular or infraclavicular adenopathy no breast tenderness or nipple discharge. (exam performed in the presence of a chaperone)  LABORATORY DATA:  I have reviewed the data as listed CMP Latest Ref Rng & Units 10/17/2018 10/06/2018 10/05/2018  Glucose 70 - 99 mg/dL 100(H) 115(H) 120(H)  BUN 6 - 23 mg/dL 18 19 30(H)  Creatinine 0.40 - 1.20 mg/dL 1.31(H) 1.08(H) 1.45(H)  Sodium 135 - 145 mEq/L 141 137 137  Potassium 3.5 - 5.1 mEq/L 3.8 4.1 4.6  Chloride 96 - 112 mEq/L 104 108 102  CO2 19 - 32 mEq/L _0 Calcium 8.4 - 10.5 mg/dL 9.4 8.5(L) 9.2  Total Protein 6.5 - 8.1 g/dL - 6.3(L) 7.4  Total Bilirubin 0.3 - 1.2 mg/dL - 0.7 1.3(H)  Alkaline Phos 38 - 126 U/L - 93 102  AST 15 - 41 U/L - 21 27  ALT 0 - 44 U/L - 20 22    Lab Results  Component Value Date   WBC 7.5 10/17/2018   HGB 12.1 10/17/2018   HCT 37.3 10/17/2018   MCV 92.2 10/17/2018   PLT 410.0 (H) 10/17/2018   NEUTROABS 3.9 10/17/2018    ASSESSMENT & PLAN:  Breast cancer of lower-outer quadrant of right female breast ([place]) Right mastectomy 08/15/2015: IDC grade 2, 2.2 cm, with associated DCIS intermediate  grade, separate focus high-grade DCIS, 0/4 lymph nodes negative, T2 N0 stage II a, ER 90%, PR 5%, HER-2 negative duration 1.42, Ki-67 60%  Treatment plan: 1. Adjuvant chemotherapy with [place] 2. Followed by adjuvant antiestrogen therapy with anastrozole 5 years No role of radiation since she had mastectomy. ----------------------------------------------------------------------------------------------------------------------- Priortreatment: Completed 5 cycles ofTCH, chemotherapy discontinued for neuropathy.Completed Herceptin maintenance 09/29/2006  Acute Diverticulitis: ED visit 02/22/16 Shingles:Resolved  Plan: 1.Anastrozole 1 mg daily startedin 03/15/16 discontinued 04/25/2016 because of pain in hands and feet,switched her to tamoxifen therapy. But she stopped taking tamoxifen as well. 3. Started letrozole 4/24/2018stopped July 2019, resumed August 2019  CT abdomen and pelvis 07/30/2016: Normal study  Letrozole toxicities:Discontinued because of muscle aches and pains May 2021. I sent a prescription for exemestane today. If it is covered by her insurance she will start  taking that. If it is not covered by insurance she will start letrozole 1 tablet every other day.  Emotional distress/body dysmorphic disorder: Hospitalization for abdominal pain June 2020: CT of the abdomen did not reveal any cause.   Breast cancer surveillance: 1.Breast exam1/28/2020: Benign left breast. Right mastectomy. 2.mammogram 12/07/2019: Benign  She moved to [place] last year and is thinking about coming back to [place] for next year because she does not like it there.  Return to clinic in1 year for follow-up and we will do mammogram at the same day.    No orders of the defined types were placed in this encounter.  The patient has a good understanding of the overall plan. she agrees with it. she will call with any problems that may develop before the next visit  here.  Total time spent: 20 mins including face to face time and time spent for planning, charting and coordination of care  Nicholas Lose, MD 12/07/2019  I, Cloyde Reams Dorshimer, am acting as scribe for Dr. Nicholas Lose.  I have reviewed the above documentation for accuracy and completeness, and I agree with the above.

## 2019-12-07 ENCOUNTER — Other Ambulatory Visit: Payer: Self-pay

## 2019-12-07 ENCOUNTER — Ambulatory Visit: Payer: Medicare Other | Admitting: Hematology and Oncology

## 2019-12-07 ENCOUNTER — Inpatient Hospital Stay: Payer: Medicare Other | Attending: Hematology and Oncology | Admitting: Hematology and Oncology

## 2019-12-07 ENCOUNTER — Ambulatory Visit
Admission: RE | Admit: 2019-12-07 | Discharge: 2019-12-07 | Disposition: A | Discharge status: Home / Self Care | Payer: Medicare Other | Source: Ambulatory Visit | Attending: Hematology and Oncology | Admitting: Hematology and Oncology

## 2019-12-07 DIAGNOSIS — Z1231 Encounter for screening mammogram for malignant neoplasm of breast: Secondary | ICD-10-CM

## 2019-12-07 DIAGNOSIS — C50511 Malignant neoplasm of lower-outer quadrant of right female breast: Secondary | ICD-10-CM | POA: Diagnosis not present

## 2019-12-07 DIAGNOSIS — Z170 Estrogen receptor positive status [ER+]: Secondary | ICD-10-CM

## 2019-12-07 DIAGNOSIS — Z79811 Long term (current) use of aromatase inhibitors: Secondary | ICD-10-CM | POA: Diagnosis not present

## 2019-12-07 DIAGNOSIS — Z08 Encounter for follow-up examination after completed treatment for malignant neoplasm: Secondary | ICD-10-CM | POA: Diagnosis not present

## 2019-12-07 MED ORDER — EXEMESTANE 25 MG PO TABS: 25.0000 mg | ORAL_TABLET | Freq: Every day | ORAL | 3 refills | Status: DC

## 2019-12-07 NOTE — Assessment & Plan Note (Signed)
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 Adjuvant chemotherapy with [place] 2. Followed by adjuvant antiestrogen therapy with anastrozole 5 years No role of radiation since she had mastectomy. ----------------------------------------------------------------------------------------------------------------------- Priortreatment: Completed 5 cycles ofTCH, chemotherapy discontinued for neuropathy.Completed Herceptin maintenance 09/29/2006  Acute Diverticulitis: ED visit 02/22/16 Shingles:Resolved  Plan: 1.Anastrozole 1 mg daily startedin 03/15/16 discontinued 04/25/2016 because of pain in hands and feet,switched her to tamoxifen therapy. But she stopped taking tamoxifen as well. 3. Started letrozole 4/24/2018stopped July 2019, resumed August 2019  CT abdomen and pelvis 07/30/2016: Normal study  Letrozole toxicities:Patient had profound aches and pains and hot flashes and myalgias for 1 month in July and she stopped it. She felt much better after she stopped it. She would like to resume it today.  Emotional distress/body dysmorphic disorder: Hospitalization for abdominal pain June 2020: CT of the abdomen did not reveal any cause.  Treated for UTI  Breast cancer surveillance: 1.Breast exam1/28/2020: Benign left breast. Right mastectomy. 2.mammogram 12/07/2019:   Patient is moving to [place] to [place]   Return to clinic in1 year for follow-up and we will do mammogram at the same day.

## 2019-12-08 ENCOUNTER — Telehealth: Payer: Self-pay | Admitting: Hematology and Oncology

## 2019-12-08 ENCOUNTER — Ambulatory Visit: Payer: Medicare Other | Admitting: Hematology and Oncology

## 2019-12-08 NOTE — Telephone Encounter (Signed)
Scheduled appt per 8/30 los. Pt confirmed appt date and time.

## 2019-12-15 ENCOUNTER — Telehealth: Payer: Self-pay

## 2019-12-15 NOTE — Telephone Encounter (Signed)
RN spoke with patient regarding medication, exemestane.    Pt started taking medication and has now developed scalp irritation and and "bumps."  Pt reports scalp is sensitive to touch, denies any new hair products.  Per patient only change is medication, exemestane.    Per MD recommendations, patient to hold medication X 2 weeks.  Benadryl 25mg  PO today to help with scalp irritation.  Pt to report changes back to clinic is 2 weeks.  Pt verbalized understanding and agreement.

## 2019-12-21 DIAGNOSIS — Z23 Encounter for immunization: Secondary | ICD-10-CM | POA: Diagnosis not present

## 2019-12-29 ENCOUNTER — Telehealth: Payer: Self-pay

## 2019-12-29 NOTE — Telephone Encounter (Signed)
Called to inform patient that MD said it's ok to take Exemestane every other day.  Patient to report back to office if any symptoms return or get worse.

## 2019-12-29 NOTE — Telephone Encounter (Signed)
Patient called to report in after being off Exemestane for 2 weeks.  She says that the blisters on her head have gone away but she is still experiencing some itching and sensitivity.  Neuropathy in hands/feet have gotten worse but nothing that she can't tolerate at this time.  Patient says that if she is to go back on Exemestane she cannot take it every day.  Will inform MD.

## 2019-12-31 ENCOUNTER — Encounter: Payer: Self-pay | Admitting: Hematology and Oncology

## 2020-01-07 DIAGNOSIS — Z79899 Other long term (current) drug therapy: Secondary | ICD-10-CM | POA: Diagnosis not present

## 2020-01-07 DIAGNOSIS — N3281 Overactive bladder: Secondary | ICD-10-CM | POA: Diagnosis not present

## 2020-01-07 DIAGNOSIS — N3943 Post-void dribbling: Secondary | ICD-10-CM | POA: Diagnosis not present

## 2020-01-07 DIAGNOSIS — R3914 Feeling of incomplete bladder emptying: Secondary | ICD-10-CM | POA: Diagnosis not present

## 2020-01-07 DIAGNOSIS — R82998 Other abnormal findings in urine: Secondary | ICD-10-CM | POA: Diagnosis not present

## 2020-01-07 DIAGNOSIS — R39198 Other difficulties with micturition: Secondary | ICD-10-CM | POA: Diagnosis not present

## 2020-01-25 ENCOUNTER — Encounter: Payer: Self-pay | Admitting: Hematology and Oncology

## 2020-01-25 DIAGNOSIS — M545 Low back pain, unspecified: Secondary | ICD-10-CM | POA: Diagnosis not present

## 2020-01-25 DIAGNOSIS — M170 Bilateral primary osteoarthritis of knee: Secondary | ICD-10-CM | POA: Diagnosis not present

## 2020-01-25 DIAGNOSIS — G8929 Other chronic pain: Secondary | ICD-10-CM | POA: Diagnosis not present

## 2020-01-25 DIAGNOSIS — M797 Fibromyalgia: Secondary | ICD-10-CM | POA: Diagnosis not present

## 2020-01-26 ENCOUNTER — Telehealth: Payer: Self-pay | Admitting: Hematology and Oncology

## 2020-01-26 NOTE — Telephone Encounter (Signed)
Scheduled appt per 10/19 sch msg  - pt is aware of appt date and time   

## 2020-02-02 NOTE — Progress Notes (Signed)
Patient Care Team: Patient, No Pcp Per as PCP - General (General Practice) [name], MD as Consulting Physician (Cardiology) Marylynn Pearson, MD as Consulting Physician (Ophthalmology) Nicholas Lose, MD as Consulting Physician (Hematology and Oncology) Kyung Rudd, MD as Consulting Physician (Radiation Oncology) Fanny Skates, MD as Consulting Physician (General Surgery) [facility], [facility] (Specialist) Suella Broad, MD as Consulting Physician (Physical Medicine and Rehabilitation) Delice Bison, Charlestine Massed, NP as Nurse Practitioner (Hematology and Oncology)  DIAGNOSIS:    ICD-10-CM   1. 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, heterozygous   08/15/2015 Surgery   Right mastectomy: IDC grade 2, 2.2 cm, with associated DCIS intermediate grade, separate focus high-grade DCIS, 0/4 lymph nodes negative, T2 N0 stage II a, ER 90%, PR 5%, HER-2 negative duration 1.42, Ki-67 60%   09/22/2015 - 01/05/2016 Chemotherapy   Adjuvant chemotherapy with TCH 5 cycles followed by Herceptin maintenance for 1 year   03/15/2016 -  Anti-estrogen oral therapy   Anastrozole then tamoxifen and then Letrozole daily (stopped due to side effects) 11/02/17 restarted on 12/03/17 on Letrozole     CHIEF COMPLIANT: Follow-up of right breast cancer on exemestane  INTERVAL HISTORY: Tracey Evans is a 74 y.o. with above-mentioned history of right breast cancer  treated with mastectomy, adjuvant chemotherapy, and who was on anti-estrogen therapy with letrozole but recently switched to exemestane. Mammogram on 12/07/19 showed no evidence of malignancy bilaterally. She presents to the clinic todayfor follow-up.    ALLERGIES:  is allergic to contrast media [iodinated diagnostic agents], dilaudid [hydromorphone hcl], adhesive [tape], codeine, and lactose intolerance (gi).  MEDICATIONS:  Current Outpatient Medications  Medication Sig Dispense Refill  . albuterol (VENTOLIN HFA) 108 (90 Base) MCG/ACT inhaler Inhale 2 puffs into the lungs every 6 (six) hours as needed for wheezing or shortness of breath. 1 Inhaler 3  . amLODipine (NORVASC) 5 MG tablet Take 5 mg by mouth daily.    Marland Kitchen aspirin 81 MG tablet Take 81 mg by mouth daily.    Marland Kitchen atorvastatin (LIPITOR) 20 MG tablet TAKE 1 TABLET BY MOUTH EVERY DAY IN THE EVENING 90 tablet 1  . diclofenac sodium (VOLTAREN) 1 % GEL PLACE ONTO THE SKIN 4 (FOUR) TIMES A DAY AS NEEDED.    Marland Kitchen diphenhydrAMINE (BENADRYL) 50 MG tablet Take one tablet (86ms total) one hour prior to study. 1 tablet 0  . exemestane (AROMASIN) 25 MG tablet Take 1 tablet (25 mg total) by mouth daily after breakfast. 90 tablet 3  . Glycerin-Polysorbate 80 (REFRESH DRY EYE THERAPY OP) Place 1 drop into both eyes 3 (three) times daily.     .Marland Kitchenletrozole (FEMARA) 2.5 MG tablet Take 1 tablet (2.5 mg total) by mouth daily. 90 tablet 3  . levothyroxine (SYNTHROID) 75 MCG tablet Take 1 tablet (75 mcg total) by mouth daily before breakfast. 90 tablet 0  . metoprolol tartrate (LOPRESSOR) 25 MG tablet Take 0.5 tablets (12.5 mg total) by mouth 2 (two) times daily. 60 tablet  0  . nitroGLYCERIN (NITROSTAT) 0.4 MG SL tablet Place 1 tablet (0.4 mg total) under the tongue every 5 (five) minutes as needed. As needed for chest pain (Patient taking differently: Place 0.4 mg under the tongue every 5 (five) minutes as needed for chest pain. ) 30 tablet 3  . pantoprazole  (PROTONIX) 40 MG tablet Take 40 mg by mouth daily.      No current facility-administered medications for this visit.    PHYSICAL EXAMINATION: ECOG PERFORMANCE STATUS: 1 - Symptomatic but completely ambulatory  Vitals:   02/03/20 1326  BP: (!) 153/89  Pulse: 70  Resp: 17  Temp: 98 F (36.7 C)  SpO2: 95%   Filed Weights   02/03/20 1326  Weight: 202 lb 12.8 oz (92 kg)    LABORATORY DATA:  I have reviewed the data as listed CMP Latest Ref Rng & Units 10/17/2018 10/06/2018 10/05/2018  Glucose 70 - 99 mg/dL 100(H) 115(H) 120(H)  BUN 6 - 23 mg/dL 18 19 30(H)  Creatinine 0.40 - 1.20 mg/dL 1.31(H) 1.08(H) 1.45(H)  Sodium 135 - 145 mEq/L 141 137 137  Potassium 3.5 - 5.1 mEq/L 3.8 4.1 4.6  Chloride 96 - 112 mEq/L 104 108 102  CO2 19 - 32 mEq/L _0 Calcium 8.4 - 10.5 mg/dL 9.4 8.5(L) 9.2  Total Protein 6.5 - 8.1 g/dL - 6.3(L) 7.4  Total Bilirubin 0.3 - 1.2 mg/dL - 0.7 1.3(H)  Alkaline Phos 38 - 126 U/L - 93 102  AST 15 - 41 U/L - 21 27  ALT 0 - 44 U/L - 20 22    Lab Results  Component Value Date   WBC 7.5 10/17/2018   HGB 12.1 10/17/2018   HCT 37.3 10/17/2018   MCV 92.2 10/17/2018   PLT 410.0 (H) 10/17/2018   NEUTROABS 3.9 10/17/2018    ASSESSMENT & PLAN:  Breast cancer of lower-outer quadrant of right female breast ([place]) Right mastectomy 08/15/2015: IDC grade 2, 2.2 cm, with associated DCIS intermediate grade, separate focus high-grade DCIS, 0/4 lymph nodes negative, T2 N0 stage II a, ER 90%, PR 5%, HER-2 negative duration 1.42, Ki-67 60%  Treatment plan: 1. Adjuvant chemotherapy with [place] 2. Followed by adjuvant antiestrogen therapy with anastrozole 5 years No role of radiation since she had mastectomy. ----------------------------------------------------------------------------------------------------------------------- Priortreatment: Completed 5 cycles ofTCH, chemotherapy discontinued for neuropathy.Completed Herceptin maintenance 09/29/2006  Acute  Diverticulitis: ED visit 02/22/16 Shingles:Resolved  Plan: 1.Anastrozole 1 mg daily startedin 03/15/16 discontinued 04/25/2016 because of pain in hands and feet,switched her to tamoxifen therapy. But she stopped taking tamoxifen as well. 3. Started letrozole 4/24/2018stopped July 2019, resumed August 2019  CT abdomen and pelvis 07/30/2016: Normal study  Letrozole toxicities:Discontinued because of muscle aches and pains May 2021.  Switched to exemestane, she could not tolerate that either. She wants to take exemestane once or twice a week.  Emotional distress/body dysmorphic disorder:Hospitalization for abdominal pain June 2020: CT of the abdomen did not reveal any cause.   Breast cancer surveillance: 1.Breast exam10/27/2021: Benign left breast. Right mastectomy. 2.mammogram 12/07/2019: Benign  She moved to [facility] last year and is thinking about coming back to [place] for next year because she does not like it there.  Return to clinic in1 year for follow-up     No orders of the defined types were placed in this encounter.  The patient has a good understanding of the overall plan. she agrees with it. she will call with any problems that may develop before the next  visit here.  Total time spent: 20 mins including face to face time and time spent for planning, charting and coordination of care  Nicholas Lose, MD 02/03/2020  I, Cloyde Reams Dorshimer, am acting as scribe for Dr. Nicholas Lose.  I have reviewed the above documentation for accuracy and completeness, and I agree with the above.

## 2020-02-02 NOTE — Assessment & Plan Note (Signed)
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 Adjuvant chemotherapy with [place] 2. Followed by adjuvant antiestrogen therapy with anastrozole 5 years No role of radiation since she had mastectomy. ----------------------------------------------------------------------------------------------------------------------- Priortreatment: Completed 5 cycles ofTCH, chemotherapy discontinued for neuropathy.Completed Herceptin maintenance 09/29/2006  Acute Diverticulitis: ED visit 02/22/16 Shingles:Resolved  Plan: 1.Anastrozole 1 mg daily startedin 03/15/16 discontinued 04/25/2016 because of pain in hands and feet,switched her to tamoxifen therapy. But she stopped taking tamoxifen as well. 3. Started letrozole 4/24/2018stopped July 2019, resumed August 2019  CT abdomen and pelvis 07/30/2016: Normal study  Letrozole toxicities:Discontinued because of muscle aches and pains May 2021. I sent a prescription for exemestane today. If it is covered by her insurance she will start taking that. If it is not covered by insurance she will start letrozole 1 tablet every other day.  Emotional distress/body dysmorphic disorder:Hospitalization for abdominal pain June 2020: CT of the abdomen did not reveal any cause.   Breast cancer surveillance: 1.Breast exam10/27/2021: Benign left breast. Right mastectomy. 2.mammogram 12/07/2019: Benign  She moved to [facility] last year and is thinking about coming back to [place] for next year because she does not like it there.  Return to clinic in1 year for follow-up

## 2020-02-03 ENCOUNTER — Inpatient Hospital Stay: Payer: Medicare Other | Attending: Hematology and Oncology | Admitting: Hematology and Oncology

## 2020-02-03 ENCOUNTER — Other Ambulatory Visit: Payer: Self-pay

## 2020-02-03 DIAGNOSIS — Z170 Estrogen receptor positive status [ER+]: Secondary | ICD-10-CM | POA: Diagnosis not present

## 2020-02-03 DIAGNOSIS — Z79811 Long term (current) use of aromatase inhibitors: Secondary | ICD-10-CM | POA: Diagnosis not present

## 2020-02-03 DIAGNOSIS — C50511 Malignant neoplasm of lower-outer quadrant of right female breast: Secondary | ICD-10-CM | POA: Diagnosis not present

## 2020-02-03 DIAGNOSIS — Z7982 Long term (current) use of aspirin: Secondary | ICD-10-CM | POA: Diagnosis not present

## 2020-02-03 DIAGNOSIS — Z9221 Personal history of antineoplastic chemotherapy: Secondary | ICD-10-CM | POA: Diagnosis not present

## 2020-02-03 DIAGNOSIS — Z9011 Acquired absence of right breast and nipple: Secondary | ICD-10-CM | POA: Insufficient documentation

## 2020-02-03 DIAGNOSIS — Z79899 Other long term (current) drug therapy: Secondary | ICD-10-CM | POA: Insufficient documentation

## 2020-02-03 MED ORDER — EXEMESTANE 25 MG PO TABS: 25.0000 mg | ORAL_TABLET | ORAL | 3 refills | Status: DC

## 2020-02-08 ENCOUNTER — Telehealth: Payer: Self-pay | Admitting: Hematology and Oncology

## 2020-02-08 NOTE — Telephone Encounter (Signed)
Scheduled per 10/27 los. Pt will receive an updated appt calendar per next visit appt notes

## 2020-02-14 DIAGNOSIS — M48061 Spinal stenosis, lumbar region without neurogenic claudication: Secondary | ICD-10-CM | POA: Diagnosis not present

## 2020-02-14 DIAGNOSIS — M5126 Other intervertebral disc displacement, lumbar region: Secondary | ICD-10-CM | POA: Diagnosis not present

## 2020-02-14 DIAGNOSIS — M47816 Spondylosis without myelopathy or radiculopathy, lumbar region: Secondary | ICD-10-CM | POA: Diagnosis not present

## 2020-02-14 DIAGNOSIS — M47817 Spondylosis without myelopathy or radiculopathy, lumbosacral region: Secondary | ICD-10-CM | POA: Diagnosis not present

## 2020-02-14 DIAGNOSIS — M5431 Sciatica, right side: Secondary | ICD-10-CM | POA: Diagnosis not present

## 2020-02-14 DIAGNOSIS — M9953 Intervertebral disc stenosis of neural canal of lumbar region: Secondary | ICD-10-CM | POA: Diagnosis not present

## 2020-02-16 DIAGNOSIS — M5459 Other low back pain: Secondary | ICD-10-CM | POA: Diagnosis not present

## 2020-02-16 DIAGNOSIS — G8929 Other chronic pain: Secondary | ICD-10-CM | POA: Diagnosis not present

## 2020-02-16 DIAGNOSIS — M79604 Pain in right leg: Secondary | ICD-10-CM | POA: Diagnosis not present

## 2020-02-25 DIAGNOSIS — M797 Fibromyalgia: Secondary | ICD-10-CM | POA: Diagnosis not present

## 2020-03-03 DIAGNOSIS — I10 Essential (primary) hypertension: Secondary | ICD-10-CM | POA: Diagnosis not present

## 2020-03-03 DIAGNOSIS — I209 Angina pectoris, unspecified: Secondary | ICD-10-CM | POA: Diagnosis not present

## 2020-04-06 DIAGNOSIS — M47816 Spondylosis without myelopathy or radiculopathy, lumbar region: Secondary | ICD-10-CM | POA: Diagnosis not present

## 2020-04-13 DIAGNOSIS — M25561 Pain in right knee: Secondary | ICD-10-CM | POA: Diagnosis not present

## 2020-04-13 DIAGNOSIS — M47816 Spondylosis without myelopathy or radiculopathy, lumbar region: Secondary | ICD-10-CM | POA: Insufficient documentation

## 2020-04-13 DIAGNOSIS — M791 Myalgia, unspecified site: Secondary | ICD-10-CM | POA: Insufficient documentation

## 2020-04-13 DIAGNOSIS — G8929 Other chronic pain: Secondary | ICD-10-CM | POA: Diagnosis not present

## 2020-04-13 DIAGNOSIS — M25551 Pain in right hip: Secondary | ICD-10-CM | POA: Diagnosis not present

## 2020-04-13 DIAGNOSIS — M25562 Pain in left knee: Secondary | ICD-10-CM | POA: Diagnosis not present

## 2020-04-13 DIAGNOSIS — M25552 Pain in left hip: Secondary | ICD-10-CM | POA: Diagnosis not present

## 2020-04-13 DIAGNOSIS — M79604 Pain in right leg: Secondary | ICD-10-CM | POA: Diagnosis not present

## 2020-05-05 DIAGNOSIS — I10 Essential (primary) hypertension: Secondary | ICD-10-CM | POA: Diagnosis not present

## 2020-05-05 DIAGNOSIS — K219 Gastro-esophageal reflux disease without esophagitis: Secondary | ICD-10-CM | POA: Diagnosis not present

## 2020-05-05 DIAGNOSIS — R1319 Other dysphagia: Secondary | ICD-10-CM | POA: Diagnosis not present

## 2020-05-05 DIAGNOSIS — F32A Depression, unspecified: Secondary | ICD-10-CM | POA: Diagnosis not present

## 2020-05-05 DIAGNOSIS — E669 Obesity, unspecified: Secondary | ICD-10-CM | POA: Diagnosis not present

## 2020-05-05 DIAGNOSIS — N3281 Overactive bladder: Secondary | ICD-10-CM | POA: Diagnosis not present

## 2020-05-05 DIAGNOSIS — Z853 Personal history of malignant neoplasm of breast: Secondary | ICD-10-CM | POA: Diagnosis not present

## 2020-05-05 DIAGNOSIS — E785 Hyperlipidemia, unspecified: Secondary | ICD-10-CM | POA: Diagnosis not present

## 2020-05-05 DIAGNOSIS — E039 Hypothyroidism, unspecified: Secondary | ICD-10-CM | POA: Diagnosis not present

## 2020-05-05 DIAGNOSIS — G894 Chronic pain syndrome: Secondary | ICD-10-CM | POA: Diagnosis not present

## 2020-05-05 DIAGNOSIS — E119 Type 2 diabetes mellitus without complications: Secondary | ICD-10-CM | POA: Diagnosis not present

## 2020-06-01 DIAGNOSIS — G8929 Other chronic pain: Secondary | ICD-10-CM | POA: Diagnosis not present

## 2020-06-01 DIAGNOSIS — M545 Low back pain, unspecified: Secondary | ICD-10-CM | POA: Diagnosis not present

## 2020-06-01 DIAGNOSIS — M791 Myalgia, unspecified site: Secondary | ICD-10-CM | POA: Diagnosis not present

## 2020-06-01 DIAGNOSIS — M47816 Spondylosis without myelopathy or radiculopathy, lumbar region: Secondary | ICD-10-CM | POA: Diagnosis not present

## 2020-06-16 ENCOUNTER — Other Ambulatory Visit: Payer: Self-pay | Admitting: Hematology and Oncology

## 2020-06-16 DIAGNOSIS — Z1231 Encounter for screening mammogram for malignant neoplasm of breast: Secondary | ICD-10-CM

## 2020-06-29 DIAGNOSIS — M47817 Spondylosis without myelopathy or radiculopathy, lumbosacral region: Secondary | ICD-10-CM | POA: Diagnosis not present

## 2020-06-29 DIAGNOSIS — M791 Myalgia, unspecified site: Secondary | ICD-10-CM | POA: Diagnosis not present

## 2020-06-29 DIAGNOSIS — M79642 Pain in left hand: Secondary | ICD-10-CM | POA: Diagnosis not present

## 2020-06-29 DIAGNOSIS — M79641 Pain in right hand: Secondary | ICD-10-CM | POA: Diagnosis not present

## 2020-08-26 DIAGNOSIS — G629 Polyneuropathy, unspecified: Secondary | ICD-10-CM | POA: Insufficient documentation

## 2020-08-26 DIAGNOSIS — F32A Depression, unspecified: Secondary | ICD-10-CM | POA: Diagnosis not present

## 2020-08-26 DIAGNOSIS — M47816 Spondylosis without myelopathy or radiculopathy, lumbar region: Secondary | ICD-10-CM | POA: Diagnosis not present

## 2020-08-29 DIAGNOSIS — H04123 Dry eye syndrome of bilateral lacrimal glands: Secondary | ICD-10-CM | POA: Diagnosis not present

## 2020-10-21 DIAGNOSIS — Z20822 Contact with and (suspected) exposure to covid-19: Secondary | ICD-10-CM | POA: Diagnosis not present

## 2020-11-07 DIAGNOSIS — K5712 Diverticulitis of small intestine without perforation or abscess without bleeding: Secondary | ICD-10-CM | POA: Diagnosis not present

## 2020-11-07 DIAGNOSIS — R8281 Pyuria: Secondary | ICD-10-CM | POA: Diagnosis not present

## 2020-11-09 DIAGNOSIS — N3281 Overactive bladder: Secondary | ICD-10-CM | POA: Diagnosis not present

## 2020-11-09 DIAGNOSIS — E785 Hyperlipidemia, unspecified: Secondary | ICD-10-CM | POA: Diagnosis not present

## 2020-11-09 DIAGNOSIS — G894 Chronic pain syndrome: Secondary | ICD-10-CM | POA: Diagnosis not present

## 2020-11-09 DIAGNOSIS — K219 Gastro-esophageal reflux disease without esophagitis: Secondary | ICD-10-CM | POA: Diagnosis not present

## 2020-11-09 DIAGNOSIS — E119 Type 2 diabetes mellitus without complications: Secondary | ICD-10-CM | POA: Diagnosis not present

## 2020-11-09 DIAGNOSIS — G629 Polyneuropathy, unspecified: Secondary | ICD-10-CM | POA: Diagnosis not present

## 2020-11-09 DIAGNOSIS — I10 Essential (primary) hypertension: Secondary | ICD-10-CM | POA: Diagnosis not present

## 2020-11-09 DIAGNOSIS — E039 Hypothyroidism, unspecified: Secondary | ICD-10-CM | POA: Diagnosis not present

## 2020-11-09 DIAGNOSIS — F32A Depression, unspecified: Secondary | ICD-10-CM | POA: Diagnosis not present

## 2020-11-09 DIAGNOSIS — Z853 Personal history of malignant neoplasm of breast: Secondary | ICD-10-CM | POA: Diagnosis not present

## 2020-11-12 DIAGNOSIS — E039 Hypothyroidism, unspecified: Secondary | ICD-10-CM | POA: Diagnosis not present

## 2020-11-12 DIAGNOSIS — Z743 Need for continuous supervision: Secondary | ICD-10-CM | POA: Diagnosis not present

## 2020-11-12 DIAGNOSIS — Z90710 Acquired absence of both cervix and uterus: Secondary | ICD-10-CM | POA: Diagnosis not present

## 2020-11-12 DIAGNOSIS — K5730 Diverticulosis of large intestine without perforation or abscess without bleeding: Secondary | ICD-10-CM | POA: Diagnosis not present

## 2020-11-12 DIAGNOSIS — E119 Type 2 diabetes mellitus without complications: Secondary | ICD-10-CM | POA: Diagnosis not present

## 2020-11-12 DIAGNOSIS — N390 Urinary tract infection, site not specified: Secondary | ICD-10-CM | POA: Diagnosis not present

## 2020-11-12 DIAGNOSIS — I2510 Atherosclerotic heart disease of native coronary artery without angina pectoris: Secondary | ICD-10-CM | POA: Diagnosis not present

## 2020-11-12 DIAGNOSIS — I10 Essential (primary) hypertension: Secondary | ICD-10-CM | POA: Diagnosis not present

## 2020-11-12 DIAGNOSIS — K922 Gastrointestinal hemorrhage, unspecified: Secondary | ICD-10-CM | POA: Diagnosis not present

## 2020-11-12 DIAGNOSIS — K921 Melena: Secondary | ICD-10-CM | POA: Diagnosis not present

## 2020-11-12 DIAGNOSIS — I951 Orthostatic hypotension: Secondary | ICD-10-CM | POA: Diagnosis not present

## 2020-11-12 DIAGNOSIS — K5792 Diverticulitis of intestine, part unspecified, without perforation or abscess without bleeding: Secondary | ICD-10-CM | POA: Diagnosis not present

## 2020-11-12 DIAGNOSIS — K5733 Diverticulitis of large intestine without perforation or abscess with bleeding: Secondary | ICD-10-CM | POA: Diagnosis not present

## 2020-11-12 DIAGNOSIS — D62 Acute posthemorrhagic anemia: Secondary | ICD-10-CM | POA: Diagnosis not present

## 2020-11-12 DIAGNOSIS — Z9049 Acquired absence of other specified parts of digestive tract: Secondary | ICD-10-CM | POA: Diagnosis not present

## 2020-11-30 DIAGNOSIS — K5731 Diverticulosis of large intestine without perforation or abscess with bleeding: Secondary | ICD-10-CM | POA: Diagnosis not present

## 2020-12-06 NOTE — Progress Notes (Signed)
Patient Care Team: Rhetta Mura, MD as PCP - General (Internal Medicine) [name], MD as Consulting Physician (Cardiology) Marylynn Pearson, MD as Consulting Physician (Ophthalmology) Nicholas Lose, MD as Consulting Physician (Hematology and Oncology) Kyung Rudd, MD as Consulting Physician (Radiation Oncology) Fanny Skates, MD as Consulting Physician (General Surgery) [facility], [facility] (Specialist) Suella Broad, MD as Consulting Physician (Physical Medicine and Rehabilitation) Delice Bison, Charlestine Massed, NP as Nurse Practitioner (Hematology and Oncology)  DIAGNOSIS:    ICD-10-CM   1. 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 ([id number]) variant of uncertain significance, heterozygous   08/15/2015 Surgery   Right mastectomy: IDC grade 2, 2.2 cm, with associated DCIS intermediate grade, separate focus high-grade DCIS, 0/4 lymph nodes negative, T2 N0 stage II a, ER 90%, PR 5%, HER-2 negative duration 1.42, Ki-67 60%   09/22/2015 - 01/05/2016 Chemotherapy   Adjuvant chemotherapy with TCH 5 cycles followed by Herceptin maintenance for 1 year   03/15/2016 -  Anti-estrogen oral therapy   Anastrozole then tamoxifen and then Letrozole daily (stopped due to side effects) 11/02/17 restarted on 12/03/17 on Letrozole     CHIEF COMPLIANT: Follow-up of right breast cancer   INTERVAL HISTORY: Tracey Evans is a 75 y.o. with above-mentioned history of right breast cancer treated  with mastectomy, adjuvant chemotherapy, and who was on anti-estrogen therapy with letrozole but recently switched to exemestane which was completed last year. She presents to the clinic today for follow-up.  She is moving back to [place] from [place].  She was hospitalized in August 16 [place] with respiratory failure and was resuscitated several times according to her.  She has fully recovered and decided to move back to [place].  She denies any lumps or nodules in the breast.  ALLERGIES:  is allergic to contrast media [iodinated diagnostic agents], dilaudid [hydromorphone hcl], adhesive [tape], codeine, and lactose intolerance (gi).  MEDICATIONS:  Current Outpatient Medications  Medication Sig Dispense Refill   gabapentin (NEURONTIN) 300 MG capsule Take 1 capsule (300 mg total) by mouth 3 (three) times daily.     magnesium oxide (MAG-OX) 400 (240 Mg) MG tablet Take 1 tablet (400 mg total) by mouth daily.     albuterol (VENTOLIN HFA) 108 (90 Base) MCG/ACT inhaler Inhale 2 puffs into the lungs every 6 (six) hours as needed for wheezing or shortness of breath. 1 Inhaler 3   amLODipine (NORVASC) 5 MG tablet Take 5 mg by mouth daily.     aspirin 81 MG tablet Take 81 mg by mouth daily.     Glycerin-Polysorbate 80 (REFRESH DRY EYE THERAPY OP) Place 1 drop into both eyes 3 (three) times daily.      levothyroxine (SYNTHROID) 75 MCG tablet Take 1 tablet (75 mcg total) by mouth daily before breakfast. 90 tablet 0   metoprolol tartrate (LOPRESSOR) 25 MG tablet Take 0.5 tablets (12.5 mg total) by mouth 2 (two) times daily. 60 tablet 0   nitroGLYCERIN (NITROSTAT) 0.4 MG SL tablet Place 1 tablet (0.4  mg total) under the tongue every 5 (five) minutes as needed. As needed for chest pain (Patient taking differently: Place 0.4 mg under the tongue every 5 (five) minutes as needed for chest pain. ) 30 tablet 3   pantoprazole (PROTONIX) 40 MG tablet Take 40 mg by mouth daily.      No current  facility-administered medications for this visit.    PHYSICAL EXAMINATION: ECOG PERFORMANCE STATUS: 1 - Symptomatic but completely ambulatory  Vitals:   12/07/20 1120  BP: 128/85  Pulse: 69  Resp: 18  Temp: 97.6 F (36.4 C)  SpO2: 98%   Filed Weights   12/07/20 1120  Weight: 180 lb 14.4 oz (82.1 kg)    BREAST: No palpable masses or nodules in either right or left breasts. No palpable axillary supraclavicular or infraclavicular adenopathy no breast tenderness or nipple discharge. (exam performed in the presence of a chaperone)  LABORATORY DATA:  I have reviewed the data as listed CMP Latest Ref Rng & Units 10/17/2018 10/06/2018 10/05/2018  Glucose 70 - 99 mg/dL 100(H) 115(H) 120(H)  BUN 6 - 23 mg/dL 18 19 30(H)  Creatinine 0.40 - 1.20 mg/dL 1.31(H) 1.08(H) 1.45(H)  Sodium 135 - 145 mEq/L 141 137 137  Potassium 3.5 - 5.1 mEq/L 3.8 4.1 4.6  Chloride 96 - 112 mEq/L 104 108 102  CO2 19 - 32 mEq/L _0 Calcium 8.4 - 10.5 mg/dL 9.4 8.5(L) 9.2  Total Protein 6.5 - 8.1 g/dL - 6.3(L) 7.4  Total Bilirubin 0.3 - 1.2 mg/dL - 0.7 1.3(H)  Alkaline Phos 38 - 126 U/L - 93 102  AST 15 - 41 U/L - 21 27  ALT 0 - 44 U/L - 20 22    Lab Results  Component Value Date   WBC 7.5 10/17/2018   HGB 12.1 10/17/2018   HCT 37.3 10/17/2018   MCV 92.2 10/17/2018   PLT 410.0 (H) 10/17/2018   NEUTROABS 3.9 10/17/2018    ASSESSMENT & PLAN:  Breast cancer of lower-outer quadrant of right female breast ([name]) Right mastectomy 08/15/2015: IDC grade 2, 2.2 cm, with associated DCIS intermediate grade, separate focus high-grade DCIS, 0/4 lymph nodes negative, T2 N0 stage II a, ER 90%, PR 5%, HER-2 negative duration 1.42, Ki-67 60%   Treatment plan: 1. Adjuvant chemotherapy with [place] 2. Followed by adjuvant antiestrogen therapy with anastrozole 5 years No role of radiation since she had  mastectomy. ----------------------------------------------------------------------------------------------------------------------- Prior treatment: Completed 5 cycles of TCH, chemotherapy discontinued for neuropathy. Completed Herceptin maintenance 09/29/2006    Acute Diverticulitis: ED visit 02/22/16 Shingles: Resolved   Plan: 1.Anastrozole 1 mg daily started in 03/15/16 discontinued 04/25/2016 because of pain in hands and feet, switched her to tamoxifen therapy. But she stopped taking tamoxifen as well. 3. Started letrozole 07/31/2016 stopped July 2019, resumed August 2019   CT abdomen and pelvis 07/30/2016: Normal study   Letrozole toxicities: Discontinued because of muscle aches and pains May 2021. I sent a prescription for exemestane today. If it is covered by her insurance she will start taking that. If it is not covered by insurance she will start letrozole 1 tablet every other day.   Emotional distress/body dysmorphic disorder: Hospitalization for abdominal pain June 2020: CT of the abdomen did not reveal any cause.    Breast cancer surveillance: 1.  Breast exam 12/07/2020: Benign left breast.  Right mastectomy. 2. mammogram  12/07/2020: Results are pending   She moved to [facility] last year and is currently in the process of  moving back to [place].  Recent hospitalization: She has had multiple complications including cardiopulmonary arrest and she managed to recover from all of that and get better.  Return to clinic in 1 year for follow-up     No orders of the defined types were placed in this encounter.  The patient has a good understanding of the overall plan. she agrees with it. she will call with any problems that may develop before the next visit here.  Total time spent: 20 mins including face to face time and time spent for planning, charting and coordination of care  Rulon Eisenmenger, MD, MPH 12/07/2020  I, Thana Ates, am acting as scribe for Dr. Nicholas Lose.  I have reviewed the above documentation for accuracy and completeness, and I agree with the above.

## 2020-12-07 ENCOUNTER — Inpatient Hospital Stay: Payer: Medicare Other | Attending: Hematology and Oncology | Admitting: Hematology and Oncology

## 2020-12-07 ENCOUNTER — Ambulatory Visit
Admission: RE | Admit: 2020-12-07 | Discharge: 2020-12-07 | Disposition: A | Discharge status: Home / Self Care | Payer: Medicare Other | Source: Ambulatory Visit | Attending: Hematology and Oncology | Admitting: Hematology and Oncology

## 2020-12-07 ENCOUNTER — Other Ambulatory Visit: Payer: Self-pay

## 2020-12-07 DIAGNOSIS — Z170 Estrogen receptor positive status [ER+]: Secondary | ICD-10-CM

## 2020-12-07 DIAGNOSIS — C50511 Malignant neoplasm of lower-outer quadrant of right female breast: Secondary | ICD-10-CM | POA: Insufficient documentation

## 2020-12-07 DIAGNOSIS — Z1231 Encounter for screening mammogram for malignant neoplasm of breast: Secondary | ICD-10-CM

## 2020-12-07 DIAGNOSIS — Z79811 Long term (current) use of aromatase inhibitors: Secondary | ICD-10-CM | POA: Insufficient documentation

## 2020-12-07 MED ORDER — MAGNESIUM OXIDE -MG SUPPLEMENT 400 (240 MG) MG PO TABS: 400.0000 mg | ORAL_TABLET | Freq: Every day | ORAL | Status: DC

## 2020-12-07 MED ORDER — GABAPENTIN 300 MG PO CAPS: 300.0000 mg | ORAL_CAPSULE | Freq: Three times a day (TID) | ORAL | Status: DC

## 2020-12-07 NOTE — Assessment & Plan Note (Signed)
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 Adjuvant chemotherapy with [place] 2. Followed by adjuvant antiestrogen therapy with anastrozole 5 years No role of radiation since she had mastectomy. ----------------------------------------------------------------------------------------------------------------------- Priortreatment: Completed 5 cycles ofTCH, chemotherapy discontinued for neuropathy.Completed Herceptin maintenance 09/29/2006  Acute Diverticulitis: ED visit 02/22/16 Shingles:Resolved  Plan: 1.Anastrozole 1 mg daily startedin 03/15/16 discontinued 04/25/2016 because of pain in hands and feet,switched her to tamoxifen therapy. But she stopped taking tamoxifen as well. 3. Started letrozole 4/24/2018stopped July 2019, resumed August 2019  CT abdomen and pelvis 07/30/2016: Normal study  Letrozole toxicities:Discontinued because of muscle aches and pains May 2021. I sent a prescription for exemestane today. If it is covered by her insurance she will start taking that. If it is not covered by insurance she will start letrozole 1 tablet every other day.  Emotional distress/body dysmorphic disorder:Hospitalization for abdominal pain June 2020: CT of the abdomen did not reveal any cause.   Breast cancer surveillance: 1.Breast exam8/31/2022: Benign left breast. Right mastectomy. 2.mammogram  12/07/2020: Results are pending  She moved to [facility] last year and is currently in the process of moving back to [place].  Recent hospitalization: She has had multiple complications including cardiopulmonary arrest and she managed to recover from all of that and get better.  Return to clinic in1 year for follow-up

## 2020-12-27 ENCOUNTER — Telehealth: Payer: Self-pay | Admitting: Hematology and Oncology

## 2020-12-27 NOTE — Telephone Encounter (Signed)
Sch per 8/30 los, left message

## 2020-12-28 ENCOUNTER — Encounter: Payer: Self-pay | Admitting: Gastroenterology

## 2020-12-28 ENCOUNTER — Ambulatory Visit (INDEPENDENT_AMBULATORY_CARE_PROVIDER_SITE_OTHER): Payer: Medicare Other | Admitting: Gastroenterology

## 2020-12-28 VITALS — BP 140/82 | HR 63 | Ht 64.0 in | Wt 182.1 lb

## 2020-12-28 DIAGNOSIS — Z800 Family history of malignant neoplasm of digestive organs: Secondary | ICD-10-CM | POA: Diagnosis not present

## 2020-12-28 DIAGNOSIS — Z8719 Personal history of other diseases of the digestive system: Secondary | ICD-10-CM | POA: Diagnosis not present

## 2020-12-28 NOTE — Patient Instructions (Signed)
If you are age 75 or older, your body mass index should be between 23-30. Your Body mass index is 31.26 kg/m. If this is out of the aforementioned range listed, please consider follow up with your Primary Care Provider. __________________________________________________________  The [place] [facility] providers would like to encourage you to use [facility] to communicate with providers for non-urgent requests or questions.  Due to long hold times on the telephone, sending your provider a message by [facility] may be a faster and more efficient way to get a response.  Please allow 48 business hours for a response.  Please remember that this is for non-urgent requests.   Once you have re-established with Cardiology, please contact the office to schedule a follow up.  Thank you for entrusting me with your care and choosing [facility].  [name], PA-C

## 2020-12-28 NOTE — Progress Notes (Signed)
12/28/2020 Tracey Evans [id number] 02-17-1946   HISTORY OF PRESENT ILLNESS: This is a 75 year old female who was previously a patient of Dr. Buel Ream known to him only for colonoscopy in 2012 at which time she only had severe diverticulosis throughout the colon, no polyps.  She does have a family history of colon cancer in her father so repeat was recommended at 5 years.  In 2017, however, she was diagnosed with breast cancer and colonoscopy got postponed.  Then she moved to [place] near her daughter for 2 years.  She just relocated back to [place] permanently at the end of August.  She is here for follow-up after a hospital stay in early August.  She had a large diverticular bleed.  She ended up receiving 6 units of packed red blood cells, 6 units of FFP, and platelets.  She ended up coding while she was there.  CTA was negative for active bleeding.  She ended up having flexible sigmoidoscopy and that showed diverticulosis and blood in the sigmoid colon.  Bleeding resolved.  She was eventually discharged and she has had no further bleeding since that time.  Repeat hemoglobin on 8/24 was normal at 13.9 g.   Past Medical History:  Diagnosis Date   Angina at rest [facility])    "occurs whenever it wants to, but worse during agitation"   Anxiety    Asthma    Binge eating disorder    Breast cancer of lower-outer quadrant of left female breast ([place]) 07/08/2015   "NEVER HAD LEFT BREAST CANCER" (08/15/2015)   Cancer of right breast ([place]) 08/15/2015   Chronic bronchitis ([facility])    Chronic lower back pain    Complication of anesthesia    had bronc spasms during intubation surgery 2009 on foot-need albuterol inhaler or neb tx preop   Costochondritis    Depression    Diverticular disease    Fibromyalgia    GERD (gastroesophageal reflux disease)    Hot flashes    Hyperlipidemia    Hypertension    Hypothyroidism    Osteoarthritis    "all over"   Peptic ulcer disease    Personal history of  chemotherapy    Shortness of breath dyspnea    Type II diabetes mellitus ([place])    "diet controlled" (08/15/2015)   Past Surgical History:  Procedure Laterality Date   ABDOMINAL HYSTERECTOMY     "left my ovaries"   ANKLE ARTHROSCOPY  01/02/2012   Procedure: ANKLE ARTHROSCOPY;  Surgeon: Colin Rhein, MD;  Location: [place];  Service: Orthopedics;  Laterality: Right;  right ankle arthroscopy with extensive debridement , dridement and drilling talar dome osteochondral lesion   ANKLE FRACTURE SURGERY Right 2009   Dr. Rolena Infante   BREAST BIOPSY  07/2015   CARDIAC CATHETERIZATION     CARPAL TUNNEL RELEASE Bilateral    CATARACT EXTRACTION, BILATERAL     COLONOSCOPY     DILATION AND CURETTAGE OF UTERUS     "before hysterectomy"   FRACTURE SURGERY     KNEE ARTHROSCOPY Bilateral    LAPAROSCOPIC CHOLECYSTECTOMY     MASTECTOMY COMPLETE / SIMPLE W/ SENTINEL NODE BIOPSY Right 08/15/2015   MASTECTOMY W/ SENTINEL NODE BIOPSY Right 08/15/2015   Procedure: TOTAL MASTECTOMY WITH SENTINEL LYMPH NODE BIOPSY AND BLUE DYE INJECTION ;  Surgeon: Fanny Skates, MD;  Location: [facility];  Service: General;  Laterality: Right;   PORT-A-CATH REMOVAL N/A 11/12/2016   Procedure: REMOVAL PORT-A-CATH;  Surgeon: Jackolyn Confer, MD;  Location: [facility];  Service: General;  Laterality: N/A;   PORTACATH PLACEMENT Left 08/15/2015   PORTACATH PLACEMENT Left 08/15/2015   Procedure: INSERTION PORT-A-CATH ;  Surgeon: Fanny Skates, MD;  Location: [facility];  Service: General;  Laterality: Left;   SHOULDER ARTHROSCOPY Bilateral 2009-2011   "bone spurs"   TONSILLECTOMY      reports that she has never smoked. She has never used smokeless tobacco. She reports that she does not currently use alcohol. She reports that she does not use drugs. family history includes Alcohol abuse in her brother and father; Breast cancer in her sister and another family member; Breast cancer (age of onset: 10) in an other family  member; Breast cancer (age of onset: 65) in her sister; Colon cancer in her father; Dementia in her father and mother; Diverticulitis in her sister; Heart Problems in her mother; Stroke in her mother. Allergies  Allergen Reactions   Contrast Media [Iodinated Diagnostic Agents] Shortness Of Breath    Patient has SOB, tightness in chest and feels like an elephant is sitting on chest, patient should be  scanned at hospital Per Dr. Alvester Chou   Dilaudid [Hydromorphone Hcl] Anaphylaxis    Pt stopped breathing   Adhesive [Tape]     blister   Codeine Nausea Only    insomnia   Lactose Intolerance (Gi) Diarrhea      Outpatient Encounter Medications as of 12/28/2020  Medication Sig   albuterol (VENTOLIN HFA) 108 (90 Base) MCG/ACT inhaler Inhale 2 puffs into the lungs every 6 (six) hours as needed for wheezing or shortness of breath.   amLODipine (NORVASC) 5 MG tablet Take 5 mg by mouth daily.   aspirin 81 MG tablet Take 81 mg by mouth daily.   gabapentin (NEURONTIN) 300 MG capsule Take 1 capsule (300 mg total) by mouth 3 (three) times daily.   Glycerin-Polysorbate 80 (REFRESH DRY EYE THERAPY OP) Place 1 drop into both eyes 3 (three) times daily.    levothyroxine (SYNTHROID) 75 MCG tablet Take 1 tablet (75 mcg total) by mouth daily before breakfast.   magnesium oxide (MAG-OX) 400 (240 Mg) MG tablet Take 1 tablet (400 mg total) by mouth daily.   metoprolol tartrate (LOPRESSOR) 25 MG tablet Take 0.5 tablets (12.5 mg total) by mouth 2 (two) times daily.   nitroGLYCERIN (NITROSTAT) 0.4 MG SL tablet Place 1 tablet (0.4 mg total) under the tongue every 5 (five) minutes as needed. As needed for chest pain (Patient taking differently: Place 0.4 mg under the tongue every 5 (five) minutes as needed for chest pain. )   pantoprazole (PROTONIX) 40 MG tablet Take 40 mg by mouth daily.    No facility-administered encounter medications on file as of 12/28/2020.     REVIEW OF SYSTEMS  : All other systems reviewed and  negative except where noted in the History of Present Illness.   PHYSICAL EXAM: Ht 5\' 4"  (1.626 m)   Wt 182 lb 2 oz (82.6 kg)   BMI 31.26 kg/m  General: Well developed AA female in no acute distress Head: Normocephalic and atraumatic Eyes:  Sclerae anicteric, conjunctiva pink. Ears: Normal auditory acuity Lungs: Clear throughout to auscultation; no W/R/R. Heart: Regular rate and rhythm; no M/R/G. Abdomen: Soft, non-distended.  BS present.  Non-tender. Musculoskeletal: Symmetrical with no gross deformities  Skin: No lesions on visible extremities Extremities: No edema  Neurological: Alert oriented x 4, grossly non-focal Psychological:  Alert and cooperative. Normal mood and affect  ASSESSMENT AND PLAN: *FH  of colon cancer in her father:  Last colonoscopy was 2012. *Diverticular bleed: Had a large diverticular bleed in early August in [place].  Received 6 units of packed red blood cells, 6 units of FFP and platelets.  CTA was negative.  Flex sig with diverticulosis and blood seen.  Repeat hemoglobin on 8/24 was normal at 13.9 g.  She has had no further bleeding.  **Patient needs colonoscopy, but she coded during her hospitalization for her diverticular bleed.  It was most likely due to hypovolemic state, etc., but she plans to reestablish care with her cardiologist here and I would really like for her to see them and have clearance prior to sedating her.  She will call back to be seen and schedule colonoscopy once she is seen and cleared by cardiology.   CC:  No ref. provider found

## 2020-12-29 NOTE — Progress Notes (Signed)
____________________________________________________________  Attending physician addendum:  Thank you for sending this case to me. I have reviewed the entire note and agree with the plan.  [facility] PCP note from 11/30/20 reviewed - details hospitalization for diverticular bleed. I agree with a cardiology evaluation , though the large volume diverticular bleeding appears to have caused the hemodynamic instability rather than a primary cardiac event.  Wilfrid Lund, MD  ____________________________________________________________

## 2020-12-30 DIAGNOSIS — E785 Hyperlipidemia, unspecified: Secondary | ICD-10-CM | POA: Diagnosis not present

## 2020-12-30 DIAGNOSIS — I2510 Atherosclerotic heart disease of native coronary artery without angina pectoris: Secondary | ICD-10-CM | POA: Diagnosis not present

## 2020-12-30 DIAGNOSIS — I10 Essential (primary) hypertension: Secondary | ICD-10-CM | POA: Diagnosis not present

## 2021-01-20 DIAGNOSIS — M25561 Pain in right knee: Secondary | ICD-10-CM | POA: Diagnosis not present

## 2021-01-20 DIAGNOSIS — Z23 Encounter for immunization: Secondary | ICD-10-CM | POA: Diagnosis not present

## 2021-01-20 DIAGNOSIS — M79609 Pain in unspecified limb: Secondary | ICD-10-CM | POA: Diagnosis not present

## 2021-01-20 DIAGNOSIS — M1611 Unilateral primary osteoarthritis, right hip: Secondary | ICD-10-CM | POA: Diagnosis not present

## 2021-01-20 DIAGNOSIS — M1711 Unilateral primary osteoarthritis, right knee: Secondary | ICD-10-CM | POA: Diagnosis not present

## 2021-01-20 DIAGNOSIS — M25551 Pain in right hip: Secondary | ICD-10-CM | POA: Diagnosis not present

## 2021-01-24 DIAGNOSIS — H40042 Steroid responder, left eye: Secondary | ICD-10-CM | POA: Diagnosis not present

## 2021-01-24 DIAGNOSIS — H20013 Primary iridocyclitis, bilateral: Secondary | ICD-10-CM | POA: Diagnosis not present

## 2021-01-24 DIAGNOSIS — H04123 Dry eye syndrome of bilateral lacrimal glands: Secondary | ICD-10-CM | POA: Diagnosis not present

## 2021-01-24 LAB — HM DIABETES EYE EXAM

## 2021-01-25 ENCOUNTER — Other Ambulatory Visit: Payer: Self-pay

## 2021-01-25 ENCOUNTER — Ambulatory Visit (INDEPENDENT_AMBULATORY_CARE_PROVIDER_SITE_OTHER): Payer: Medicare Other | Admitting: Family Medicine

## 2021-01-25 ENCOUNTER — Encounter: Payer: Self-pay | Admitting: Family Medicine

## 2021-01-25 VITALS — BP 124/70 | HR 61 | Temp 98.2°F | Resp 16 | Ht 64.0 in | Wt 185.4 lb

## 2021-01-25 DIAGNOSIS — E039 Hypothyroidism, unspecified: Secondary | ICD-10-CM

## 2021-01-25 DIAGNOSIS — I10 Essential (primary) hypertension: Secondary | ICD-10-CM

## 2021-01-25 DIAGNOSIS — E119 Type 2 diabetes mellitus without complications: Secondary | ICD-10-CM

## 2021-01-25 DIAGNOSIS — F331 Major depressive disorder, recurrent, moderate: Secondary | ICD-10-CM | POA: Diagnosis not present

## 2021-01-25 DIAGNOSIS — Z8719 Personal history of other diseases of the digestive system: Secondary | ICD-10-CM | POA: Diagnosis not present

## 2021-01-25 DIAGNOSIS — G894 Chronic pain syndrome: Secondary | ICD-10-CM

## 2021-01-25 DIAGNOSIS — E785 Hyperlipidemia, unspecified: Secondary | ICD-10-CM

## 2021-01-25 DIAGNOSIS — E1169 Type 2 diabetes mellitus with other specified complication: Secondary | ICD-10-CM | POA: Diagnosis not present

## 2021-01-25 DIAGNOSIS — M797 Fibromyalgia: Secondary | ICD-10-CM | POA: Diagnosis not present

## 2021-01-25 LAB — TSH: TSH: 5.18 u[IU]/mL (ref 0.35–5.50)

## 2021-01-25 NOTE — Progress Notes (Signed)
Subjective:  Patient ID: Tracey Evans, female    DOB: 02-23-1946  Age: 75 y.o. MRN: [id number]  CC:  Chief Complaint  Patient presents with   New Patient (Initial Visit)    Pt here for establish care, reports needs okay for full colonoscopy due to GI bleed, reports this comes from GI     HPI BREELY PANIK presents for  New patient here to establish care.  Most recently followed by [facility].  [place].  Previously a patient of Dr. Birdie Riddle, last seen in September 2020. Went to [place] for health issues - closer to daughter there. Health issues worse in weather in [place], and sister here locally.  History of breast cancer, hypertension, hyperlipidemia, diabetes, depression,  hypothyroidism, asthma,Overactive bladder/stress incontinence, GERD, PUD, diverticulosis with diverticular bleed, anemia.  GI: History of diverticular bleed, GERD, PUD as above.  Plan for colonoscopy with gastroenterology -   Takes Protonix 40 mg daily.  Had GI bleed August 6th - diverticular bleed, 6 units blood, 6 units FFP and platelets for transfusion. She did code during that hospitalization ( 4 times per patient). CTA negative for active bleeding. Flex sig with diverticulosis and blood in sigmoid colon. Bleeding resolved, no new bleeding since hospitalization.  HGB normal at 13.9 on 11/30/20. Saw [name] on 12/28/20 Prior pt of Dr. Sharlett Iles, colonoscopy with severe diverticulosis in 2012. Repeat colonoscopy postponed d/t breast CA in 2017.  Plan for colonoscopy, but cardiac clearance for sedation recommended. She has seen Dr. Terrence Dupont since and cleared for colonoscopy, with plan to stop aspirin.   Hypothyroidism: Lab Results  Component Value Date   TSH 5.18 01/25/2021  Synthroid 75 mcg daily. No missed doses.  Last tsh 5.64 on 11/09/20. Plan for repeat testing.  History of breast cancer, right breast, 2017.  Oncologist Dr. Lindi Adie in 2020.  Status  postmastectomy, adjuvant chemotherapy then anastrozole, followed by tamoxifen, followed by letrozole, then off. Has continued to follow up with Dr. Lindi Adie.   Hypertension: With history of atherosclerotic heart disease, previously followed by Dr. Terrence Dupont amlodipine 5 mg daily, metoprolol 25 mg daily. BP Readings from Last 3 Encounters:  01/25/21 124/70  12/28/20 140/82  12/07/20 128/85   Lab Results  Component Value Date   CREATININE 1.31 (H) 10/17/2018   Diabetes: Diet controlled. Binge eating with agitation in past.  Last A1c: 6.1 on 11/09/20. Improved. Some binge eating past few weeks. Frustrated with arthritis pain and difficulty getting treatment. [facility] for [facility] pain. Has sen Dr. Nelva Bush in past.  Hx of fibromyalgia. Pain mgt in [place] gave option of higher dosing of gabapentin.  Neuropathy from chemo - takes gabapentin 300mg  tid - wears off. Did not tolerate cymbalta.  Microalbumin: normal 05/05/20.   Lab Results  Component Value Date   HGBA1C 6.6 (H) 09/03/2018   HGBA1C 6.5 04/30/2018   HGBA1C 6.5 07/08/2017   Lab Results  Component Value Date   MICROALBUR 1.3 04/30/2018   LDLCALC 102 (H) 04/30/2018   CREATININE 1.31 (H) 10/17/2018    Depression: Declines meds at this time.  Counseling in past.  Pain contributing to her symptoms and frustration.   Depression screen [facility] 2/9 01/25/2021 12/11/2018 10/14/2018 09/03/2018 06/24/2018  Decreased Interest 3 3 3  0 0  Down, Depressed, Hopeless 2 3 3  0 0  PHQ - 2 Score 5 6 6  0 0  Altered sleeping 3 2 0 - 0  Tired, decreased energy 3 1 0 - 0  Change in appetite 3 3 0 - 0  Feeling bad or failure about yourself  0 1 0 - 0  Trouble concentrating 0 1 0 - 0  Moving slowly or fidgety/restless 0 0 0 - 0  Suicidal thoughts 0 0 0 - 0  PHQ-9 Score 14 14 6  - 0  Difficult doing work/chores - Somewhat difficult Somewhat difficult - Not difficult at all  Some recent data might be hidden          History Patient Active  Problem List   Diagnosis Date Noted   History of GI diverticular bleed 12/28/2020   Recurrent falls 10/14/2018   UTI (urinary tract infection) 10/05/2018   Hypotension 10/05/2018   AKI (acute kidney injury) ([place]) 10/05/2018   Cervical disc disease 05/02/2017   Anemia associated with nutritional deficiency 01/13/2016   Syncope 10/05/2015   Genetic testing 08/21/2015   Family history of breast cancer in female 07/15/2015   Family history of colon cancer 07/15/2015   Breast cancer of lower-outer quadrant of right female breast ([place]) 07/08/2015   Disturbance in sleep behavior 11/18/2014   Fibromyalgia 10/04/2014   Diverticulitis of colon (without mention of hemorrhage)(562.11) 12/01/2013   Dry mouth 04/29/2013   Dry eye 04/29/2013   Obesity (BMI 30-39.9) 03/11/2013   Edema 09/04/2012   Depression 09/04/2012   Chemotherapy-induced peripheral neuropathy ([place]) 05/12/2012   OAB (overactive bladder) 19/14/7829   DM w/o complication type II ([place]) 02/06/2012   Allergy to dogs 04/19/2011   Polyarthralgia 09/18/2010   STRESS INCONTINENCE 04/25/2010   Asthma 04/11/2010   VERTIGO 04/11/2010   Hypothyroidism 01/19/2010   Hyperlipidemia associated with type 2 diabetes mellitus ([place]) 01/19/2010   Essential hypertension 01/19/2010   GERD 01/19/2010   PEPTIC ULCER DISEASE 01/19/2010   OSTEOARTHRITIS 01/19/2010   LOW BACK PAIN 01/19/2010   Past Medical History:  Diagnosis Date   Angina at rest [facility])    "occurs whenever it wants to, but worse during agitation"   Anxiety    Asthma    Binge eating disorder    Breast cancer of lower-outer quadrant of left female breast ([place]) 07/08/2015   "NEVER HAD LEFT BREAST CANCER" (08/15/2015)   Cancer of right breast ([place]) 08/15/2015   Chronic bronchitis (HCC)    Chronic lower back pain    Complication of anesthesia    had bronc spasms during intubation surgery 2009 on foot-need albuterol inhaler or neb tx preop   Costochondritis    Depression     Diverticular disease    Fibromyalgia    GERD (gastroesophageal reflux disease)    Hot flashes    Hyperlipidemia    Hypertension    Hypothyroidism    Osteoarthritis    "all over"   Peptic ulcer disease    Personal history of chemotherapy    Shortness of breath dyspnea    Type II diabetes mellitus ([place])    "diet controlled" (08/15/2015)   Past Surgical History:  Procedure Laterality Date   ABDOMINAL HYSTERECTOMY     "left my ovaries"   ANKLE ARTHROSCOPY  01/02/2012   Procedure: ANKLE ARTHROSCOPY;  Surgeon: Colin Rhein, MD;  Location: [facility];  Service: Orthopedics;  Laterality: Right;  right ankle arthroscopy with extensive debridement , dridement and drilling talar dome osteochondral lesion   ANKLE FRACTURE SURGERY Right 2009   Dr. Rolena Infante   BREAST BIOPSY  07/2015   CARDIAC CATHETERIZATION     CARPAL TUNNEL RELEASE Bilateral    CATARACT EXTRACTION, BILATERAL  COLONOSCOPY     DILATION AND CURETTAGE OF UTERUS     "before hysterectomy"   FRACTURE SURGERY     KNEE ARTHROSCOPY Bilateral    LAPAROSCOPIC CHOLECYSTECTOMY     MASTECTOMY COMPLETE / SIMPLE W/ SENTINEL NODE BIOPSY Right 08/15/2015   MASTECTOMY W/ SENTINEL NODE BIOPSY Right 08/15/2015   Procedure: TOTAL MASTECTOMY WITH SENTINEL LYMPH NODE BIOPSY AND BLUE DYE INJECTION ;  Surgeon: Fanny Skates, MD;  Location: [facility];  Service: General;  Laterality: Right;   PORT-A-CATH REMOVAL N/A 11/12/2016   Procedure: REMOVAL PORT-A-CATH;  Surgeon: Jackolyn Confer, MD;  Location: [facility];  Service: General;  Laterality: N/A;   PORTACATH PLACEMENT Left 08/15/2015   PORTACATH PLACEMENT Left 08/15/2015   Procedure: INSERTION PORT-A-CATH ;  Surgeon: Fanny Skates, MD;  Location: [facility];  Service: General;  Laterality: Left;   SHOULDER ARTHROSCOPY Bilateral 2009-2011   "bone spurs"   TONSILLECTOMY     Allergies  Allergen Reactions   Contrast Media [Iodinated Diagnostic Agents] Shortness Of Breath     Patient has SOB, tightness in chest and feels like an elephant is sitting on chest, patient should be  scanned at hospital Per Dr. Alvester Chou   Dilaudid [Hydromorphone Hcl] Anaphylaxis    Pt stopped breathing   Adhesive [Tape]     blister   Codeine Nausea Only    insomnia   Lactose Intolerance (Gi) Diarrhea   Prior to Admission medications   Medication Sig Start Date End Date Taking? Authorizing Provider  albuterol (VENTOLIN HFA) 108 (90 Base) MCG/ACT inhaler Inhale 2 puffs into the lungs every 6 (six) hours as needed for wheezing or shortness of breath. 08/14/18  Yes Midge Minium, MD  amLODipine (NORVASC) 5 MG tablet Take 5 mg by mouth daily. 11/30/18  Yes [provider]  aspirin 81 MG tablet Take 81 mg by mouth daily.   Yes [provider]  gabapentin (NEURONTIN) 300 MG capsule Take 1 capsule (300 mg total) by mouth 3 (three) times daily. 12/07/20  Yes Nicholas Lose, MD  levothyroxine (SYNTHROID) 75 MCG tablet Take 1 tablet (75 mcg total) by mouth daily before breakfast. 01/26/19  Yes Midge Minium, MD  magnesium oxide (MAG-OX) 400 (240 Mg) MG tablet Take 1 tablet (400 mg total) by mouth daily. Patient taking differently: Take 400 mg by mouth 2 (two) times daily. 12/07/20  Yes Nicholas Lose, MD  nitroGLYCERIN (NITROSTAT) 0.4 MG SL tablet Place 1 tablet (0.4 mg total) under the tongue every 5 (five) minutes as needed. As needed for chest pain 11/18/14  Yes Midge Minium, MD  pantoprazole (PROTONIX) 40 MG tablet Take 40 mg by mouth daily.  06/12/18  Yes [provider]  Glycerin-Polysorbate 80 (REFRESH DRY EYE THERAPY OP) Place 1 drop into both eyes 3 (three) times daily. Patient not taking: Reported on 01/25/2021    [provider]  metoprolol tartrate (LOPRESSOR) 25 MG tablet Take 0.5 tablets (12.5 mg total) by mouth 2 (two) times daily. Patient taking differently: Take 25 mg by mouth daily. 06/09/18 12/28/20  Elgergawy, Silver Huguenin, MD   Social History    Socioeconomic History   Marital status: Single    Spouse name: Not on file   Number of children: Not on file   Years of education: Not on file   Highest education level: Not on file  Occupational History   Occupation: retired  Tobacco Use   Smoking status: Never   Smokeless tobacco: Never  Vaping Use  Vaping Use: Never used  Substance and Sexual Activity   Alcohol use: Not Currently    Alcohol/week: 0.0 standard drinks    Comment: 08/15/2015 "1/2 glass of wine q 2 months"    Drug use: No   Sexual activity: Not Currently    Comment: 1st intercourse- 14, partners- 4, divorced  Other Topics Concern   Not on file  Social History Narrative   Not on file   Social Determinants of Health   Financial Resource Strain: Not on file  Food Insecurity: Not on file  Transportation Needs: Not on file  Physical Activity: Not on file  Stress: Not on file  Social Connections: Not on file  Intimate Partner Violence: Not on file    Review of Systems Per HPI.   Objective:   Vitals:   01/25/21 0919  BP: 124/70  Pulse: 61  Resp: 16  Temp: 98.2 F (36.8 C)  TempSrc: Temporal  SpO2: 100%  Weight: 185 lb 6.4 oz (84.1 kg)  Height: 5\' 4"  (1.626 m)     Physical Exam Vitals reviewed.  Constitutional:      Appearance: Normal appearance. She is well-developed.  HENT:     Head: Normocephalic and atraumatic.  Eyes:     Conjunctiva/sclera: Conjunctivae normal.     Pupils: Pupils are equal, round, and reactive to light.  Neck:     Vascular: No carotid bruit.  Cardiovascular:     Rate and Rhythm: Normal rate and regular rhythm.     Heart sounds: Normal heart sounds.  Pulmonary:     Effort: Pulmonary effort is normal.     Breath sounds: Normal breath sounds.  Abdominal:     General: There is no distension.     Palpations: Abdomen is soft. There is no mass or pulsatile mass.     Tenderness: There is no abdominal tenderness. There is no guarding or rebound.  Musculoskeletal:      Right lower leg: No edema.     Left lower leg: No edema.  Skin:    General: Skin is warm and dry.  Neurological:     Mental Status: She is alert and oriented to person, place, and time.  Psychiatric:        Mood and Affect: Mood normal.        Behavior: Behavior normal.     50 minutes spent during visit, including chart review, counseling and assimilation of information, exam, discussion of plan, and chart completion.    Assessment & Plan:  Tracey Evans is a 75 y.o. female . History of GI diverticular bleed  -Plan for follow-up colonoscopy be with local gastroenterologist.  I do not see any apparent contraindications based on her current visit and symptoms to proceeding with that testing, as she reports cardiology eval has been given.  Previous codes in the hospital likely related to her hypovolemic state.  Essential hypertension  -Stable, continue same regimen, ongoing follow-up with cardiology.  Acquired hypothyroidism Hypothyroidism, unspecified type - Plan: TSH  -Updated TSH ordered.  Stable.    Hyperlipidemia associated with type 2 diabetes mellitus ([place]) Type 2 diabetes mellitus without complication, without long-term current use of insulin (HCC)  -A1c stable on recent testing.  No med changes at this time.  Fibromyalgia Moderate episode of recurrent major depressive disorder (HCC) Chronic pain syndrome  -Denies specific treatment for depression, RTC precautions given.  Likely component of chronic pain.  Option of slight higher dose of gabapentin but recommended she discuss with her physical  medicine and rehab specialist and Ortho.  Option of pain management referral.  No orders of the defined types were placed in this encounter.  Patient Instructions  I do not see any restrictions from my standpoint for colonoscopy.  Try increasing gabapentin to 2 pills at night, stay at 1 pill on other doses. Follow up in next month to decide on further changes or can refer you  to pain management if needed (or can follow up with Dr. Nelva Bush).  Let me know if you would like to consider medication for depression, but see info below. Treating chronic pain may also help.  I will recheck thyroid test. Thanks for coming in today.   Managing Depression, Adult Depression is a mental health condition that affects your thoughts, feelings, and actions. Being diagnosed with depression can bring you relief if you did not know why you have felt or behaved a certain way. It could also leave you feeling overwhelmed with uncertainty about your future. Preparing yourself to manage your symptoms can help you feel more positive about your future. How to manage lifestyle changes Managing stress Stress is your body's reaction to life changes and events, both good and bad. Stress can add to your feelings of depression. Learning to manage your stress can help lessen your feelings of depression. Try some of the following approaches to reducing your stress (stress reduction techniques): Listen to music that you enjoy and that inspires you. Try using a meditation app or take a meditation class. Develop a practice that helps you connect with your spiritual self. Walk in nature, pray, or go to a place of worship. Do some deep breathing. To do this, inhale slowly through your nose. Pause at the top of your inhale for a few seconds and then exhale slowly, letting your muscles relax. Practice yoga to help relax and work your muscles. Choose a stress reduction technique that suits your lifestyle and personality. These techniques take time and practice to develop. Set aside 5-15 minutes a day to do them. Therapists can offer training in these techniques. Other things you can do to manage stress include: Keeping a stress diary. Knowing your limits and saying no when you think something is too much. Paying attention to how you react to certain situations. You may not be able to control everything, but you can  change your reaction. Adding humor to your life by watching funny films or TV shows. Making time for activities that you enjoy and that relax you.  Medicines Medicines, such as antidepressants, are often a part of treatment for depression. Talk with your pharmacist or health care provider about all the medicines, supplements, and herbal products that you take, their possible side effects, and what medicines and other products are safe to take together. Make sure to report any side effects you may have to your health care provider. Relationships Your health care provider may suggest family therapy, couples therapy, or individual therapy as part of your treatment. How to recognize changes Everyone responds differently to treatment for depression. As you recover from depression, you may start to: Have more interest in doing activities. Feel less hopeless. Have more energy. Overeat less often, or have a better appetite. Have better mental focus. It is important to recognize if your depression is not getting better or is getting worse. The symptoms you had in the beginning may return, such as: Tiredness (fatigue) or low energy. Eating too much or too little. Sleeping too much or too little. Feeling restless, agitated,  or hopeless. Trouble focusing or making decisions. Unexplained physical complaints. Feeling irritable, angry, or aggressive. If you or your family members notice these symptoms coming back, let your health care provider know right away. Follow these instructions at home: Activity  Try to get some form of exercise each day, such as walking, biking, swimming, or lifting weights. Practice stress reduction techniques. Engage your mind by taking a class or doing some volunteer work. Lifestyle Get the right amount and quality of sleep. Cut down on using caffeine, tobacco, alcohol, and other potentially harmful substances. Eat a healthy diet that includes plenty of vegetables,  fruits, whole grains, low-fat dairy products, and lean protein. Do not eat a lot of foods that are high in solid fats, added sugars, or salt (sodium). General instructions Take over-the-counter and prescription medicines only as told by your health care provider. Keep all follow-up visits as told by your health care provider. This is important. Where to find support Talking to others Friends and family members can be sources of support and guidance. Talk to trusted friends or family members about your condition. Explain your symptoms to them, and let them know that you are working with a health care provider to treat your depression. Tell friends and family members how they also can be helpful. Finances Find appropriate mental health providers that fit with your financial situation. Talk with your health care provider about options to get reduced prices on your medicines. Where to find more information You can find support in your area from: Anxiety and Depression Association of America (ADAA): www.adaa.org Mental Health America: www.mentalhealthamerica.net [employer] on Mental Illness: www.nami.org Contact a health care provider if: You stop taking your antidepressant medicines, and you have any of these symptoms: Nausea. Headache. Light-headedness. Chills and body aches. Not being able to sleep (insomnia). You or your friends and family think your depression is getting worse. Get help right away if: You have thoughts of hurting yourself or others. If you ever feel like you may hurt yourself or others, or have thoughts about taking your own life, get help right away. Go to your nearest emergency department or: Call your local emergency services (911 in the [place] Call a suicide crisis helpline, such as the [place] at 585-443-6813. This is open 24 hours a day in the [place] Text the Crisis Text Line at 878-036-7079 (in the [place] Summary If you are diagnosed  with depression, preparing yourself to manage your symptoms is a good way to feel positive about your future. Work with your health care provider on a management plan that includes stress reduction techniques, medicines (if applicable), therapy, and healthy lifestyle habits. Keep talking with your health care provider about how your treatment is working. If you have thoughts about taking your own life, call a suicide crisis helpline or text a crisis text line. This information is not intended to replace advice given to you by your health care provider. Make sure you discuss any questions you have with your health care provider. Document Revised: 02/04/2019 Document Reviewed: 02/04/2019 Elsevier Patient Education  [place].   Chronic Pain, Adult Chronic pain is a type of pain that lasts or keeps coming back for at least 3-6 months. You may have headaches, pain in the abdomen, or pain in other areas of the body. Chronic pain may be related to an illness, such as fibromyalgia or complex regional pain syndrome. Chronic pain may also be related to an injury or a health condition.  Sometimes, the cause of chronic pain is not known. Chronic pain can make it hard for you to do daily activities. If not treated, chronic pain can lead to anxiety and depression. Treatment depends on the cause and severity of your pain. You may need to work with a pain specialist to come up with a treatment plan. The plan may include medicine, counseling, and physical therapy. Many people benefit from a combination of two or more types of treatment to control their pain. Follow these instructions at home: Medicines Take over-the-counter and prescription medicines only as told by your health care provider. Ask your health care provider if the medicine prescribed to you: Requires you to avoid driving or using machinery. Can cause constipation. You may need to take these actions to prevent or treat constipation: Drink  enough fluid to keep your urine pale yellow. Take over-the-counter or prescription medicines. Eat foods that are high in fiber, such as beans, whole grains, and fresh fruits and vegetables. Limit foods that are high in fat and processed sugars, such as fried or sweet foods. Treatment plan Follow your treatment plan as told by your health care provider. This may include: Gentle, regular exercise. Eating a healthy diet that includes foods such as vegetables, fruits, fish, and lean meats. Cognitive or behavioral therapy that changes the way you think or act in response to the pain. This may help improve how you feel. Working with a physical therapist. Meditation, yoga, acupuncture, or massage therapy. Aroma, color, light, or sound therapy. Local electrical stimulation. The electrical pulses help to relieve pain by temporarily stopping the nerve impulses that cause you to feel pain. Injections. These deliver numbing or pain-relieving medicines into the spine or the area of pain.  Lifestyle  Ask your health care provider whether you should keep a pain diary. Your health care provider will tell you what information to write in the diary. This may include when you have pain, what the pain feels like, and how medicines and other behaviors or treatments help to reduce the pain. Consider talking with a mental health care provider about how to manage chronic pain. Consider joining a chronic pain support group. Try to control or lower your stress levels. Talk with your health care provider about ways to do this. General instructions Learn as much as you can about how to manage your chronic pain. Ask your health care provider if an intensive pain rehabilitation program or a chronic pain specialist would be helpful. Check your pain level as told by your health care provider. Ask your health care provider if you should use a pain scale. It is up to you to get the results of any tests that were done. Ask  your health care provider, or the department that is doing the tests, when your results will be ready. Keep all follow-up visits as told by your health care provider. This is important. Contact a health care provider if: Your pain gets worse, or you have new pain. You have trouble sleeping. You have trouble doing your normal activities. Your pain is not controlled with treatment. You have side effects from pain medicine. You feel weak. You notice any other changes that show that your condition is getting worse. Get help right away if: You lose feeling or have numbness in your body. You lose control of bowel or bladder function. Your pain suddenly gets much worse. You develop shaking or chills. You develop confusion. You develop chest pain. You have trouble breathing or shortness of  breath. You pass out. You have thoughts about hurting yourself or others. If you ever feel like you may hurt yourself or others, or have thoughts about taking your own life, get help right away. Go to your nearest emergency department or: Call your local emergency services (911 in the [place] Call a suicide crisis helpline, such as the [facility] at 910-061-5234. This is open 24 hours a day in the [place] Text the Crisis Text Line at (774) 086-3218 (in the [place].). Summary Chronic pain is a type of pain that lasts or keeps coming back for at least 3-6 months. Chronic pain may be related to an illness, injury, or other health condition. Sometimes, the cause of chronic pain is not known. Treatment depends on the cause and severity of your pain. Many people benefit from a combination of two or more types of treatment to control their pain. Follow your treatment plan as told by your health care provider. This information is not intended to replace advice given to you by your health care provider. Make sure you discuss any questions you have with your health care provider. Document Revised:  12/11/2018 Document Reviewed: 12/11/2018 Elsevier Patient Education  2022 [place],   Merri Ray, MD [facility] 01/27/21 1:25 PM

## 2021-01-25 NOTE — Patient Instructions (Addendum)
I do not see any restrictions from my standpoint for colonoscopy.  Try increasing gabapentin to 2 pills at night, stay at 1 pill on other doses. Follow up in next month to decide on further changes or can refer you to pain management if needed (or can follow up with Dr. Nelva Bush).  Let me know if you would like to consider medication for depression, but see info below. Treating chronic pain may also help.  I will recheck thyroid test. Thanks for coming in today.   Managing Depression, Adult Depression is a mental health condition that affects your thoughts, feelings, and actions. Being diagnosed with depression can bring you relief if you did not know why you have felt or behaved a certain way. It could also leave you feeling overwhelmed with uncertainty about your future. Preparing yourself to manage your symptoms can help you feel more positive about your future. How to manage lifestyle changes Managing stress Stress is your body's reaction to life changes and events, both good and bad. Stress can add to your feelings of depression. Learning to manage your stress can help lessen your feelings of depression. Try some of the following approaches to reducing your stress (stress reduction techniques): Listen to music that you enjoy and that inspires you. Try using a meditation app or take a meditation class. Develop a practice that helps you connect with your spiritual self. Walk in nature, pray, or go to a place of worship. Do some deep breathing. To do this, inhale slowly through your nose. Pause at the top of your inhale for a few seconds and then exhale slowly, letting your muscles relax. Practice yoga to help relax and work your muscles. Choose a stress reduction technique that suits your lifestyle and personality. These techniques take time and practice to develop. Set aside 5-15 minutes a day to do them. Therapists can offer training in these techniques. Other things you can do to manage stress  include: Keeping a stress diary. Knowing your limits and saying no when you think something is too much. Paying attention to how you react to certain situations. You may not be able to control everything, but you can change your reaction. Adding humor to your life by watching funny films or TV shows. Making time for activities that you enjoy and that relax you.  Medicines Medicines, such as antidepressants, are often a part of treatment for depression. Talk with your pharmacist or health care provider about all the medicines, supplements, and herbal products that you take, their possible side effects, and what medicines and other products are safe to take together. Make sure to report any side effects you may have to your health care provider. Relationships Your health care provider may suggest family therapy, couples therapy, or individual therapy as part of your treatment. How to recognize changes Everyone responds differently to treatment for depression. As you recover from depression, you may start to: Have more interest in doing activities. Feel less hopeless. Have more energy. Overeat less often, or have a better appetite. Have better mental focus. It is important to recognize if your depression is not getting better or is getting worse. The symptoms you had in the beginning may return, such as: Tiredness (fatigue) or low energy. Eating too much or too little. Sleeping too much or too little. Feeling restless, agitated, or hopeless. Trouble focusing or making decisions. Unexplained physical complaints. Feeling irritable, angry, or aggressive. If you or your family members notice these symptoms coming back, let your health  care provider know right away. Follow these instructions at home: Activity  Try to get some form of exercise each day, such as walking, biking, swimming, or lifting weights. Practice stress reduction techniques. Engage your mind by taking a class or doing  some volunteer work. Lifestyle Get the right amount and quality of sleep. Cut down on using caffeine, tobacco, alcohol, and other potentially harmful substances. Eat a healthy diet that includes plenty of vegetables, fruits, whole grains, low-fat dairy products, and lean protein. Do not eat a lot of foods that are high in solid fats, added sugars, or salt (sodium). General instructions Take over-the-counter and prescription medicines only as told by your health care provider. Keep all follow-up visits as told by your health care provider. This is important. Where to find support Talking to others Friends and family members can be sources of support and guidance. Talk to trusted friends or family members about your condition. Explain your symptoms to them, and let them know that you are working with a health care provider to treat your depression. Tell friends and family members how they also can be helpful. Finances Find appropriate mental health providers that fit with your financial situation. Talk with your health care provider about options to get reduced prices on your medicines. Where to find more information You can find support in your area from: Anxiety and Depression Association of America (ADAA): www.adaa.org Mental Health America: www.mentalhealthamerica.net [employer] on Mental Illness: www.nami.org Contact a health care provider if: You stop taking your antidepressant medicines, and you have any of these symptoms: Nausea. Headache. Light-headedness. Chills and body aches. Not being able to sleep (insomnia). You or your friends and family think your depression is getting worse. Get help right away if: You have thoughts of hurting yourself or others. If you ever feel like you may hurt yourself or others, or have thoughts about taking your own life, get help right away. Go to your nearest emergency department or: Call your local emergency services (911 in the  [place] Call a suicide crisis helpline, such as the [facility] at 934-395-3792. This is open 24 hours a day in the [place] Text the Crisis Text Line at 6206288553 (in the [place].). Summary If you are diagnosed with depression, preparing yourself to manage your symptoms is a good way to feel positive about your future. Work with your health care provider on a management plan that includes stress reduction techniques, medicines (if applicable), therapy, and healthy lifestyle habits. Keep talking with your health care provider about how your treatment is working. If you have thoughts about taking your own life, call a suicide crisis helpline or text a crisis text line. This information is not intended to replace advice given to you by your health care provider. Make sure you discuss any questions you have with your health care provider. Document Revised: 02/04/2019 Document Reviewed: 02/04/2019 Elsevier Patient Education  [place].   Chronic Pain, Adult Chronic pain is a type of pain that lasts or keeps coming back for at least 3-6 months. You may have headaches, pain in the abdomen, or pain in other areas of the body. Chronic pain may be related to an illness, such as fibromyalgia or complex regional pain syndrome. Chronic pain may also be related to an injury or a health condition. Sometimes, the cause of chronic pain is not known. Chronic pain can make it hard for you to do daily activities. If not treated, chronic pain can lead to  anxiety and depression. Treatment depends on the cause and severity of your pain. You may need to work with a pain specialist to come up with a treatment plan. The plan may include medicine, counseling, and physical therapy. Many people benefit from a combination of two or more types of treatment to control their pain. Follow these instructions at home: Medicines Take over-the-counter and prescription medicines only as told by your health care  provider. Ask your health care provider if the medicine prescribed to you: Requires you to avoid driving or using machinery. Can cause constipation. You may need to take these actions to prevent or treat constipation: Drink enough fluid to keep your urine pale yellow. Take over-the-counter or prescription medicines. Eat foods that are high in fiber, such as beans, whole grains, and fresh fruits and vegetables. Limit foods that are high in fat and processed sugars, such as fried or sweet foods. Treatment plan Follow your treatment plan as told by your health care provider. This may include: Gentle, regular exercise. Eating a healthy diet that includes foods such as vegetables, fruits, fish, and lean meats. Cognitive or behavioral therapy that changes the way you think or act in response to the pain. This may help improve how you feel. Working with a physical therapist. Meditation, yoga, acupuncture, or massage therapy. Aroma, color, light, or sound therapy. Local electrical stimulation. The electrical pulses help to relieve pain by temporarily stopping the nerve impulses that cause you to feel pain. Injections. These deliver numbing or pain-relieving medicines into the spine or the area of pain.  Lifestyle  Ask your health care provider whether you should keep a pain diary. Your health care provider will tell you what information to write in the diary. This may include when you have pain, what the pain feels like, and how medicines and other behaviors or treatments help to reduce the pain. Consider talking with a mental health care provider about how to manage chronic pain. Consider joining a chronic pain support group. Try to control or lower your stress levels. Talk with your health care provider about ways to do this. General instructions Learn as much as you can about how to manage your chronic pain. Ask your health care provider if an intensive pain rehabilitation program or a chronic  pain specialist would be helpful. Check your pain level as told by your health care provider. Ask your health care provider if you should use a pain scale. It is up to you to get the results of any tests that were done. Ask your health care provider, or the department that is doing the tests, when your results will be ready. Keep all follow-up visits as told by your health care provider. This is important. Contact a health care provider if: Your pain gets worse, or you have new pain. You have trouble sleeping. You have trouble doing your normal activities. Your pain is not controlled with treatment. You have side effects from pain medicine. You feel weak. You notice any other changes that show that your condition is getting worse. Get help right away if: You lose feeling or have numbness in your body. You lose control of bowel or bladder function. Your pain suddenly gets much worse. You develop shaking or chills. You develop confusion. You develop chest pain. You have trouble breathing or shortness of breath. You pass out. You have thoughts about hurting yourself or others. If you ever feel like you may hurt yourself or others, or have thoughts about taking your  own life, get help right away. Go to your nearest emergency department or: Call your local emergency services (911 in the [place] Call a suicide crisis helpline, such as the [facility] at (646)716-7162. This is open 24 hours a day in the [place] Text the Crisis Text Line at (346) 062-2345 (in the [place].). Summary Chronic pain is a type of pain that lasts or keeps coming back for at least 3-6 months. Chronic pain may be related to an illness, injury, or other health condition. Sometimes, the cause of chronic pain is not known. Treatment depends on the cause and severity of your pain. Many people benefit from a combination of two or more types of treatment to control their pain. Follow your treatment plan as told by  your health care provider. This information is not intended to replace advice given to you by your health care provider. Make sure you discuss any questions you have with your health care provider. Document Revised: 12/11/2018 Document Reviewed: 12/11/2018 Elsevier Patient Education  [place].

## 2021-02-01 DIAGNOSIS — M25561 Pain in right knee: Secondary | ICD-10-CM | POA: Diagnosis not present

## 2021-02-02 ENCOUNTER — Ambulatory Visit: Payer: Medicare Other | Admitting: Hematology and Oncology

## 2021-02-03 ENCOUNTER — Encounter: Payer: Self-pay | Admitting: Internal Medicine

## 2021-02-07 ENCOUNTER — Telehealth: Payer: Self-pay | Admitting: Family Medicine

## 2021-02-07 ENCOUNTER — Other Ambulatory Visit: Payer: Self-pay

## 2021-02-07 DIAGNOSIS — E039 Hypothyroidism, unspecified: Secondary | ICD-10-CM

## 2021-02-07 DIAGNOSIS — K219 Gastro-esophageal reflux disease without esophagitis: Secondary | ICD-10-CM

## 2021-02-07 DIAGNOSIS — I10 Essential (primary) hypertension: Secondary | ICD-10-CM

## 2021-02-07 DIAGNOSIS — M797 Fibromyalgia: Secondary | ICD-10-CM

## 2021-02-07 MED ORDER — LEVOTHYROXINE SODIUM 75 MCG PO TABS: 75.0000 ug | ORAL_TABLET | Freq: Every day | ORAL | 0 refills | Status: DC

## 2021-02-07 MED ORDER — METOPROLOL TARTRATE 25 MG PO TABS: 12.5000 mg | ORAL_TABLET | Freq: Two times a day (BID) | ORAL | 0 refills | Status: DC

## 2021-02-07 MED ORDER — AMLODIPINE BESYLATE 5 MG PO TABS: 5.0000 mg | ORAL_TABLET | Freq: Every day | ORAL | 3 refills | Status: DC

## 2021-02-07 MED ORDER — PANTOPRAZOLE SODIUM 40 MG PO TBEC: 40.0000 mg | DELAYED_RELEASE_TABLET | Freq: Every day | ORAL | 3 refills | Status: DC

## 2021-02-07 MED ORDER — GABAPENTIN 300 MG PO CAPS: 300.0000 mg | ORAL_CAPSULE | Freq: Three times a day (TID) | ORAL | 3 refills | Status: DC

## 2021-02-07 NOTE — Telephone Encounter (Signed)
Called patient to verify what medications were needed to be filled. Patient stated that she needed Levothyroxine, Amlodipine, Metoprolol, Protonix, Gabapentin. All 5 have been filled and sent to patient pharmacy.

## 2021-02-07 NOTE — Telephone Encounter (Signed)
..  Caller name:  Aizah Gehlhausen  Caller callback #: [phone]  Encourage patient to contact the pharmacy for refills or they can request refills through [facility]  (Please schedule appointment if patient has not been seen in over a year)  MEDICATION NAME & DOSE:  Patient states that it is all 5 of her medications.  Notes/Comments from patient:These were prescribed previously from her other doctor in [place].  WHAT PHARMACY WOULD THEY LIKE THIS SENT TO:   Please notify patient: It takes 48-72 hours to process rx refill requests Ask patient to call pharmacy to ensure rx is ready before heading there.   (CLINICAL TO FILL OR ROUTE PER PROTOCOLS)

## 2021-02-08 DIAGNOSIS — M25561 Pain in right knee: Secondary | ICD-10-CM | POA: Diagnosis not present

## 2021-02-17 DIAGNOSIS — M25561 Pain in right knee: Secondary | ICD-10-CM | POA: Diagnosis not present

## 2021-02-21 DIAGNOSIS — M25561 Pain in right knee: Secondary | ICD-10-CM | POA: Diagnosis not present

## 2021-02-23 DIAGNOSIS — M542 Cervicalgia: Secondary | ICD-10-CM | POA: Diagnosis not present

## 2021-02-27 ENCOUNTER — Ambulatory Visit (INDEPENDENT_AMBULATORY_CARE_PROVIDER_SITE_OTHER): Payer: Medicare Other | Admitting: Family Medicine

## 2021-02-27 VITALS — BP 148/74 | HR 71 | Temp 98.2°F | Resp 16 | Ht 64.0 in | Wt 189.4 lb

## 2021-02-27 DIAGNOSIS — R0982 Postnasal drip: Secondary | ICD-10-CM

## 2021-02-27 DIAGNOSIS — G894 Chronic pain syndrome: Secondary | ICD-10-CM | POA: Diagnosis not present

## 2021-02-27 DIAGNOSIS — M797 Fibromyalgia: Secondary | ICD-10-CM | POA: Diagnosis not present

## 2021-02-27 DIAGNOSIS — J45909 Unspecified asthma, uncomplicated: Secondary | ICD-10-CM | POA: Diagnosis not present

## 2021-02-27 DIAGNOSIS — R7303 Prediabetes: Secondary | ICD-10-CM | POA: Diagnosis not present

## 2021-02-27 DIAGNOSIS — Z8719 Personal history of other diseases of the digestive system: Secondary | ICD-10-CM | POA: Diagnosis not present

## 2021-02-27 MED ORDER — ALBUTEROL SULFATE HFA 108 (90 BASE) MCG/ACT IN AERS: 2.0000 | INHALATION_SPRAY | Freq: Four times a day (QID) | RESPIRATORY_TRACT | 1 refills | Status: DC | PRN

## 2021-02-27 NOTE — Patient Instructions (Addendum)
Keep follow up with Dr. Alvan Dame and Dr. Nelva Bush as planned. If you would like to meet with pain management at some point we would be happy to refer you.   Cut back on spicy food as that may be worsening airway issues.  Try flonase over the counter (using correct technique )  for drainage.  Albuterol inhaler if needed, but if you require that daily or continued nighttime symptoms in the next 2 weeks - please be seen.  Follow up in 3 months for recheck labs. Sooner if needed.  Take care.

## 2021-02-27 NOTE — Progress Notes (Signed)
Subjective:  Patient ID: Tracey Evans, female    DOB: 12-Apr-1945  Age: 75 y.o. MRN: [id number]  CC:  Chief Complaint  Patient presents with   Pain     HPI Tracey Evans presents for  Follow-up from October 19 visit, new patient to establish care with me at that time with other concerns addressed.  Previous patient of Dr. Birdie Riddle.  History of diverticular bleed hospitalized with unresponsiveness/code in the hospital thought to be due to the gastrointestinal bleed and hypovolemic state.  Discussed last visit with plan for follow-up with local gastroenterologist. Plan for colonoscopy 12/22.  No melena/hematochezia.   Fibromyalgia, chronic pain syndrome, with history of depression Also discussed last visit.  Declined specific treatment for depression at that time, likely component of chronic pain.  Option of slightly higher dose of gabapentin at 2 per night, continue 1 during the day and am. Planned for her discussed with her physical medicine and rehab specialist (Dr. Nelva Bush), as well as orthopedics.  Pain management referral option also given. Neuropathy pain is better at higher dose. Arthritis pain min change.  Dr. Alvan Dame has her in PT through December.  Seen at ortho work-in clinic due to neck issue - on prednisone - everything including arthritis pain feels better. Multiple areas of arthritis.  Considering seeing Dr. Nelva Bush again after upcoming appt with Dr. Alvan Dame.  Does not want to take any extra pills if possible, does not want to take any other pills for pain.  Discussed positive depression screening.  Denies feeling depression - just agitated. Denies suicidal ideation. No homicidal plans.  Does not want to take any more meds, did not tolerate Cymbalta in past (hallucinations).  Had flu vaccine, covid booster, and shingles vaccine 01/25/21.    Depression screen [facility] 2/9 02/27/2021 01/25/2021 12/11/2018 10/14/2018 09/03/2018  Decreased Interest 3 3 3 3  0  Down, Depressed,  Hopeless 2 2 3 3  0  PHQ - 2 Score 5 5 6 6  0  Altered sleeping 3 3 2  0 -  Tired, decreased energy 3 3 1  0 -  Change in appetite 3 3 3  0 -  Feeling bad or failure about yourself  0 0 1 0 -  Trouble concentrating 0 0 1 0 -  Moving slowly or fidgety/restless 0 0 0 0 -  Suicidal thoughts 0 0 0 0 -  PHQ-9 Score 14 14 14 6  -  Difficult doing work/chores - - Somewhat difficult Somewhat difficult -  Some recent data might be hidden   Asthma: Has nebulizer at home if needed, albuterol as needed - needs refill Asthma worse recently.  [place] in neighborhood.  Wheezing, some dyspnea past 2 weeks. Out of albuterol. No change in breathing with prednisone.  Waking up with deep breaths at night at times. Cough - and feels better. Feels like in back of throat.  No recent heartburn. On protonix daily.  Some postnasal drip past weeks. Does not like using nasal sprays. Worse with spicy food.   PreDiabetes: Reports this was due to dietary indiscretion in past.  A1C 6.1 on 11/09/20.  Lab Results  Component Value Date   HGBA1C 6.6 (H) 09/03/2018   HGBA1C 6.5 04/30/2018   HGBA1C 6.5 07/08/2017   Lab Results  Component Value Date   MICROALBUR 1.3 04/30/2018   LDLCALC 102 (H) 04/30/2018   CREATININE 1.31 (H) 10/17/2018    History Patient Active Problem List   Diagnosis Date Noted   History of GI diverticular bleed 12/28/2020  Recurrent falls 10/14/2018   UTI (urinary tract infection) 10/05/2018   Hypotension 10/05/2018   AKI (acute kidney injury) ([place]) 10/05/2018   Cervical disc disease 05/02/2017   Anemia associated with nutritional deficiency 01/13/2016   Syncope 10/05/2015   Genetic testing 08/21/2015   Family history of breast cancer in female 07/15/2015   Family history of colon cancer 07/15/2015   Breast cancer of lower-outer quadrant of right female breast ([place]) 07/08/2015   Disturbance in sleep behavior 11/18/2014   Fibromyalgia 10/04/2014   Diverticulitis of colon (without  mention of hemorrhage)(562.11) 12/01/2013   Dry mouth 04/29/2013   Dry eye 04/29/2013   Obesity (BMI 30-39.9) 03/11/2013   Edema 09/04/2012   Depression 09/04/2012   Chemotherapy-induced peripheral neuropathy ([place]) 05/12/2012   OAB (overactive bladder) 67/61/9509   DM w/o complication type II ([place]) 02/06/2012   Allergy to dogs 04/19/2011   Polyarthralgia 09/18/2010   STRESS INCONTINENCE 04/25/2010   Asthma 04/11/2010   VERTIGO 04/11/2010   Hypothyroidism 01/19/2010   Hyperlipidemia associated with type 2 diabetes mellitus ([place]) 01/19/2010   Essential hypertension 01/19/2010   GERD 01/19/2010   PEPTIC ULCER DISEASE 01/19/2010   OSTEOARTHRITIS 01/19/2010   LOW BACK PAIN 01/19/2010   Past Medical History:  Diagnosis Date   Angina at rest [facility])    "occurs whenever it wants to, but worse during agitation"   Anxiety    Asthma    Binge eating disorder    Breast cancer of lower-outer quadrant of left female breast ([place]) 07/08/2015   "NEVER HAD LEFT BREAST CANCER" (08/15/2015)   Cancer of right breast ([place]) 08/15/2015   Chronic bronchitis (H[facility])    Chronic lower back pain    Complication of anesthesia    had bronc spasms during intubation surgery 2009 on foot-need albuterol inhaler or neb tx preop   Costochondritis    Depression    Diverticular disease    Fibromyalgia    GERD (gastroesophageal reflux disease)    Hot flashes    Hyperlipidemia    Hypertension    Hypothyroidism    Osteoarthritis    "all over"   Peptic ulcer disease    Personal history of chemotherapy    Shortness of breath dyspnea    Type II diabetes mellitus ([place])    "diet controlled" (08/15/2015)   Past Surgical History:  Procedure Laterality Date   ABDOMINAL HYSTERECTOMY     "left my ovaries"   ANKLE ARTHROSCOPY  01/02/2012   Procedure: ANKLE ARTHROSCOPY;  Surgeon: Colin Rhein, MD;  Location: [place];  Service: Orthopedics;  Laterality: Right;  right ankle arthroscopy with extensive  debridement , dridement and drilling talar dome osteochondral lesion   ANKLE FRACTURE SURGERY Right 2009   Dr. Rolena Infante   BREAST BIOPSY  07/2015   CARDIAC CATHETERIZATION     CARPAL TUNNEL RELEASE Bilateral    CATARACT EXTRACTION, BILATERAL     COLONOSCOPY     DILATION AND CURETTAGE OF UTERUS     "before hysterectomy"   FRACTURE SURGERY     KNEE ARTHROSCOPY Bilateral    LAPAROSCOPIC CHOLECYSTECTOMY     MASTECTOMY COMPLETE / SIMPLE W/ SENTINEL NODE BIOPSY Right 08/15/2015   MASTECTOMY W/ SENTINEL NODE BIOPSY Right 08/15/2015   Procedure: TOTAL MASTECTOMY WITH SENTINEL LYMPH NODE BIOPSY AND BLUE DYE INJECTION ;  Surgeon: Fanny Skates, MD;  Location: [place];  Service: General;  Laterality: Right;   PORT-A-CATH REMOVAL N/A 11/12/2016   Procedure: REMOVAL PORT-A-CATH;  Surgeon: Jackolyn Confer, MD;  Location: [place];  Service: General;  Laterality: N/A;   PORTACATH PLACEMENT Left 08/15/2015   PORTACATH PLACEMENT Left 08/15/2015   Procedure: INSERTION PORT-A-CATH ;  Surgeon: Fanny Skates, MD;  Location: [place];  Service: General;  Laterality: Left;   SHOULDER ARTHROSCOPY Bilateral 2009-2011   "bone spurs"   TONSILLECTOMY     Allergies  Allergen Reactions   Contrast Media [Iodinated Diagnostic Agents] Shortness Of Breath    Patient has SOB, tightness in chest and feels like an elephant is sitting on chest, patient should be  scanned at hospital Per Dr. Alvester Chou   Dilaudid [Hydromorphone Hcl] Anaphylaxis    Pt stopped breathing   Adhesive [Tape]     blister   Codeine Nausea Only    insomnia   Cymbalta [Duloxetine Hcl] Other (See Comments)    Hallucinations.    Lactose Intolerance (Gi) Diarrhea   Lyrica [Pregabalin] Other (See Comments)    Mood swings.    Prior to Admission medications   Medication Sig Start Date End Date Taking? Authorizing Provider  albuterol (VENTOLIN HFA) 108 (90 Base) MCG/ACT inhaler Inhale 2 puffs into the lungs every 6 (six) hours as needed for  wheezing or shortness of breath. 08/14/18  Yes Midge Minium, MD  amLODipine (NORVASC) 5 MG tablet Take 1 tablet (5 mg total) by mouth daily. 02/07/21  Yes Wendie Agreste, MD  aspirin 81 MG tablet Take 81 mg by mouth daily.   Yes [provider]  gabapentin (NEURONTIN) 300 MG capsule Take 1 capsule (300 mg total) by mouth 3 (three) times daily. 02/07/21  Yes Wendie Agreste, MD  Glycerin-Polysorbate 80 (REFRESH DRY EYE THERAPY OP) Place 1 drop into both eyes 3 (three) times daily.   Yes [provider]  levothyroxine (SYNTHROID) 75 MCG tablet Take 1 tablet (75 mcg total) by mouth daily before breakfast. 02/07/21  Yes Wendie Agreste, MD  magnesium oxide (MAG-OX) 400 (240 Mg) MG tablet Take 1 tablet (400 mg total) by mouth daily. Patient taking differently: Take 400 mg by mouth 2 (two) times daily. 12/07/20  Yes Nicholas Lose, MD  metoprolol tartrate (LOPRESSOR) 25 MG tablet Take 0.5 tablets (12.5 mg total) by mouth 2 (two) times daily. 02/07/21 02/07/22 Yes Wendie Agreste, MD  nitroGLYCERIN (NITROSTAT) 0.4 MG SL tablet Place 1 tablet (0.4 mg total) under the tongue every 5 (five) minutes as needed. As needed for chest pain 11/18/14  Yes Midge Minium, MD  pantoprazole (PROTONIX) 40 MG tablet Take 1 tablet (40 mg total) by mouth daily. 02/07/21  Yes Wendie Agreste, MD   Social History   Socioeconomic History   Marital status: Single    Spouse name: Not on file   Number of children: Not on file   Years of education: Not on file   Highest education level: Not on file  Occupational History   Occupation: retired  Tobacco Use   Smoking status: Never   Smokeless tobacco: Never  Vaping Use   Vaping Use: Never used  Substance and Sexual Activity   Alcohol use: Not Currently    Alcohol/week: 0.0 standard drinks    Comment: 08/15/2015 "1/2 glass of wine q 2 months"    Drug use: No   Sexual activity: Not Currently    Comment: 1st intercourse- 14, partners- 42,  divorced  Other Topics Concern   Not on file  Social History Narrative   Not on file   Social Determinants of Health  Financial Resource Strain: Not on file  Food Insecurity: Not on file  Transportation Needs: Not on file  Physical Activity: Not on file  Stress: Not on file  Social Connections: Not on file  Intimate Partner Violence: Not on file    Review of Systems Per HPI.  Objective:   Vitals:   02/27/21 1101 02/27/21 1221  BP: (!) 142/78 (!) 148/74  Pulse: 71   Resp: 16   Temp: 98.2 F (36.8 C)   TempSrc: Temporal   SpO2: 95%   Weight: 189 lb 6.4 oz (85.9 kg)   Height: 5\' 4"  (1.626 m)    BP Readings from Last 3 Encounters:  02/27/21 (!) 148/74  01/25/21 124/70  12/28/20 140/82   Physical Exam Vitals reviewed.  Constitutional:      Appearance: Normal appearance. She is well-developed.  HENT:     Head: Normocephalic and atraumatic.  Eyes:     Conjunctiva/sclera: Conjunctivae normal.     Pupils: Pupils are equal, round, and reactive to light.  Neck:     Vascular: No carotid bruit.  Cardiovascular:     Rate and Rhythm: Normal rate and regular rhythm.     Heart sounds: Normal heart sounds.  Pulmonary:     Effort: Pulmonary effort is normal. No respiratory distress.     Breath sounds: Normal breath sounds. No stridor. No wheezing or rales.  Abdominal:     Palpations: Abdomen is soft. There is no pulsatile mass.     Tenderness: There is no abdominal tenderness.  Musculoskeletal:     Right lower leg: No edema.     Left lower leg: No edema.  Skin:    General: Skin is warm and dry.  Neurological:     Mental Status: She is alert and oriented to person, place, and time.  Psychiatric:        Mood and Affect: Mood normal.        Behavior: Behavior normal.        Thought Content: Thought content normal.    Assessment & Plan:  Tracey Evans is a 75 y.o. female . Uncomplicated asthma, unspecified asthma severity, unspecified whether persistent - Plan:  albuterol (VENTOLIN HFA) 108 (90 Base) MCG/ACT inhaler  -Refilled albuterol, if persistent or frequent need RTC precautions discussed for other medications.  Fibromyalgia Chronic pain syndrome  -Denies depression, reports positive symptoms above due to irritation.  She declines medication for treatment.  Did not tolerate Cymbalta previously.  Would like to continue follow-up with her physical medicine rehab/orthopedic specialist.  Option of pain management referral in the future if needed.  History of GI diverticular bleed  -Denies new signs or symptoms of bleeding, has ongoing follow-up with gastroenterology.  Postnasal drip  -Flonase nasal spray discussed, some upper airway issues may be related to reflux, avoid spicy foods.  Correct technique for use of Flonase nasal spray was discussed.  Prediabetes  -Watch diet, recheck labs in 3 months.  Meds ordered this encounter  Medications   albuterol (VENTOLIN HFA) 108 (90 Base) MCG/ACT inhaler    Sig: Inhale 2 puffs into the lungs every 6 (six) hours as needed for wheezing or shortness of breath.    Dispense:  1 each    Refill:  1   Patient Instructions  Keep follow up with Dr. Alvan Dame and Dr. Nelva Bush as planned. If you would like to meet with pain management at some point we would be happy to refer you.   Cut back on spicy food  as that may be worsening airway issues.  Try flonase over the counter (using correct technique )  for drainage.  Albuterol inhaler if needed, but if you require that daily or continued nighttime symptoms in the next 2 weeks - please be seen.  Follow up in 3 months for recheck labs. Sooner if needed.  Take care.       Signed,   Merri Ray, MD [place], [facility] 03/01/21 10:39 AM

## 2021-03-01 ENCOUNTER — Encounter: Payer: Self-pay | Admitting: Family Medicine

## 2021-03-10 DIAGNOSIS — M1611 Unilateral primary osteoarthritis, right hip: Secondary | ICD-10-CM | POA: Diagnosis not present

## 2021-03-10 DIAGNOSIS — M1711 Unilateral primary osteoarthritis, right knee: Secondary | ICD-10-CM | POA: Diagnosis not present

## 2021-03-13 ENCOUNTER — Telehealth: Payer: Self-pay | Admitting: Gastroenterology

## 2021-03-13 NOTE — Telephone Encounter (Deleted)
The pt returned call and states she was seen by [facility] [place] Tel: [phone]   I spoke with the cardiology office and the pt was seen and records to be faxed to attention [name].

## 2021-03-13 NOTE — Telephone Encounter (Signed)
Inbound call from patient states she is experiencing swollen intestine, bloating/ She does not know what is causing and is afraid it can cause a GI Bleed.

## 2021-03-13 NOTE — Telephone Encounter (Addendum)
The pt has complaints of bloating and water brash when bending over.  She has a history of diverticular bleed in 8/22 at [facility].  She has no rectal bleeding, abd pain, nausea or other GI complaints.  The pt reveals that she has been under a great deal of stress with two family members who are in a nursing home dying.  She has also been eating "a whole bag of twix candy bars a day". I did advise her to stop the chocolate and we discussed how stress can play a role in causing reflux to worsen.  She is taking protonix 40 mg once daily.  I recommended she take pepcid at bedtime. She has a recall colon scheduled for late December with Dr Hilarie Fredrickson.  (Looks like it should have been with Dr Danis-supervising?-last colon was with Dr Sharlett Iles) Not sure if you want her to have an EGD as well. However, I do not see where she was seen by cardiology- She was last seen in September of this year and was asked to follow up with cardiology see below:    **Patient needs colonoscopy, but she coded during her hospitalization for her diverticular bleed.  It was most likely due to hypovolemic state, etc., but she plans to reestablish care with her cardiologist here and I would really like for her to see them and have clearance prior to sedating her.  She will call back to be seen and schedule colonoscopy once she is seen and cleared by cardiology.      I did call the cardiology office and they will fax the recent office not to attn [name] PA

## 2021-03-14 ENCOUNTER — Telehealth: Payer: Self-pay

## 2021-03-14 NOTE — Telephone Encounter (Signed)
I also do not know why the patient was put on Dr. Hilarie Fredrickson schedule.  Please see separate nursing telephone note from yesterday regarding patient's upper GI symptoms.  I do not think the patient currently needs an upper endoscopy. However, the patient was to see cardiology after her September office visit with [place].  The chart note indicates that she was seen by a cardiology practice (presumably outside of [facility] health), and that a note is being faxed to [facility]'s attention.  That note needs to be located and reviewed by physician prior to this patient's procedure. I am out of the office this week managing the inpatient consult service, returning Tuesday, December 13.  -HD

## 2021-03-14 NOTE — Telephone Encounter (Signed)
[name] and Dr. Hilarie Fredrickson:  Pt has bronchospasms during intubation in 2009.  Has had 2 surgeries since without issues.  Please review and advise.  Thank you

## 2021-03-14 NOTE — Telephone Encounter (Signed)
This is a patient of [name] -- he supervised when she was seen by an APP earlier this year. I see that she is on my schedule, do you know if this was an error or due to lack of availability for [name]? Thanks [employer]

## 2021-03-14 NOTE — Telephone Encounter (Signed)
Dr. Hilarie Fredrickson:  I am not sure why this patient was initially assigned to you with [name] having reviewed the chart.  However, [name] has nothing available that week she is scheduled with you.

## 2021-03-14 NOTE — Telephone Encounter (Signed)
We will await reply from anesthesia

## 2021-03-15 NOTE — Telephone Encounter (Signed)
I spoke with the pt and explained that her procedure was scheduled in error with Dr Hilarie Fredrickson.  I let her know that Tracey Evans will review her cardiology note and we will contact her to set up the colon with Dr Loletha Carrow.  The pt was upset because of the changes but agreed to reschedule when cardiology note is reviewed.

## 2021-03-16 DIAGNOSIS — Z20822 Contact with and (suspected) exposure to covid-19: Secondary | ICD-10-CM | POA: Diagnosis not present

## 2021-03-16 NOTE — Telephone Encounter (Signed)
  She is cleared for anesthesia at [facility] with the pt and we have scheduled her for previsit (03/30/21) and colon (04/19/21).  She is very upset that her original appt was cancelled due to being with the wrong provider (Dr Hilarie Fredrickson).  I tried to explain the process to her and why we needed to reschedule with Dr Loletha Carrow.  She told me " I don't want to hear any more about this I am through the roof upset right now, just schedule me".  She was given the appt information.

## 2021-03-24 DIAGNOSIS — I10 Essential (primary) hypertension: Secondary | ICD-10-CM | POA: Diagnosis not present

## 2021-03-24 DIAGNOSIS — I2510 Atherosclerotic heart disease of native coronary artery without angina pectoris: Secondary | ICD-10-CM | POA: Diagnosis not present

## 2021-03-24 DIAGNOSIS — E039 Hypothyroidism, unspecified: Secondary | ICD-10-CM | POA: Diagnosis not present

## 2021-03-24 DIAGNOSIS — E785 Hyperlipidemia, unspecified: Secondary | ICD-10-CM | POA: Diagnosis not present

## 2021-03-28 ENCOUNTER — Telehealth: Payer: Self-pay

## 2021-03-28 NOTE — Telephone Encounter (Signed)
Bri see the message from [place] below.

## 2021-03-28 NOTE — Telephone Encounter (Signed)
Noted on PV chart ?

## 2021-03-28 NOTE — Telephone Encounter (Signed)
Janett Billow do you remember if the notes from this patients cardiologist was sent to be scanned?

## 2021-03-28 NOTE — Telephone Encounter (Signed)
SECURE CHAT message sent to [name]- will you please find out if we have received a cardiology clearance for this patient- she is scheduled for a PV on 12/22. We do not have any paper work or documentation staying she has clearance from cardiology for this procedure.  Thank you   [name]'s response on a secure chat was =Look at the 12/5 phone note.   [name] reviewed the notes and cleared her for the [place].  They sent it in a bubble chat and I had to screen snip it and add it to the chart.    [name]- will you please locate the cardiology notation clearing this patient for [place] and provide a copy for Pre Visit, as I cannot locate this documentation that was requested by [name], PA and [name].

## 2021-03-30 ENCOUNTER — Ambulatory Visit (AMBULATORY_SURGERY_CENTER): Payer: Medicare Other | Admitting: *Deleted

## 2021-03-30 ENCOUNTER — Other Ambulatory Visit: Payer: Self-pay

## 2021-03-30 ENCOUNTER — Encounter: Payer: Medicare Other | Admitting: Internal Medicine

## 2021-03-30 VITALS — Ht 64.5 in | Wt 180.0 lb

## 2021-03-30 DIAGNOSIS — Z8719 Personal history of other diseases of the digestive system: Secondary | ICD-10-CM

## 2021-03-30 DIAGNOSIS — Z800 Family history of malignant neoplasm of digestive organs: Secondary | ICD-10-CM

## 2021-03-30 MED ORDER — NA SULFATE-K SULFATE-MG SULF 17.5-3.13-1.6 GM/177ML PO SOLN: 1.0000 | Freq: Once | ORAL | 0 refills | Status: AC

## 2021-03-30 NOTE — Progress Notes (Signed)
No egg or soy allergy known to patient  No issues known to pt with past sedation with any surgeries or procedures Patient denies ever being told they had issues or difficulty with intubation  No FH of Malignant Hyperthermia Pt is not on diet pills Pt is not on  home 02  Pt is not on blood thinners  Pt denies issues with constipation  No A fib or A flutter  Pt is fully vaccinated  for Covid  day , Code to Pharmacy and  NO PA's for preps discussed with pt In PV today  Discussed with pt there will be an out-of-pocket cost for prep and that varies from $0 to 70 +  dollars - pt verbalized understanding   Due to the COVID-19 pandemic we are asking patients to follow certain guidelines in PV and the [place]   Pt aware of COVID protocols and [facility] guidelines   PV completed over the phone. Pt verified name, DOB, address and insurance during PV today.  Pt mailed instruction packet with copy of consent form to read and not return, and instructions.   Pt encouraged to call with questions or issues.  If pt has My chart, procedure instructions sent via My Chart

## 2021-04-01 ENCOUNTER — Other Ambulatory Visit: Payer: Self-pay | Admitting: Family Medicine

## 2021-04-01 DIAGNOSIS — I10 Essential (primary) hypertension: Secondary | ICD-10-CM

## 2021-04-05 ENCOUNTER — Other Ambulatory Visit: Payer: Self-pay | Admitting: Family Medicine

## 2021-04-05 DIAGNOSIS — K219 Gastro-esophageal reflux disease without esophagitis: Secondary | ICD-10-CM

## 2021-04-05 DIAGNOSIS — E039 Hypothyroidism, unspecified: Secondary | ICD-10-CM

## 2021-04-18 ENCOUNTER — Telehealth: Payer: Self-pay | Admitting: Gastroenterology

## 2021-04-18 NOTE — Telephone Encounter (Signed)
Patient called and stated that for the last 5 days she has had diarrhea. Has a colonoscopy tomorrow and is concerned. Seeking advice, Please advise.

## 2021-04-18 NOTE — Telephone Encounter (Signed)
Pt called in complaining of loose stools for the last 4-5 days. No pain or any other symptoms. She has not been around anyone sick, and no changes to medication or diet. She has tried eating foods like toast & eggs, however still having diarrhea. She is scheduled for a colonoscopy tomorrow 1/11 with [name]. Please advise.

## 2021-04-18 NOTE — Telephone Encounter (Signed)
Per PA, pt is okay to proceed with colonoscopy tomorrow as planned as long as pt is afebrile & feels well. Pt is aware, and is comfortable proceeding with plan.

## 2021-04-19 ENCOUNTER — Other Ambulatory Visit: Payer: Self-pay

## 2021-04-19 ENCOUNTER — Encounter: Payer: Self-pay | Admitting: Gastroenterology

## 2021-04-19 ENCOUNTER — Ambulatory Visit (AMBULATORY_SURGERY_CENTER): Payer: Medicare Other | Admitting: Gastroenterology

## 2021-04-19 VITALS — BP 166/92 | HR 67 | Temp 97.8°F | Resp 17 | Ht 64.0 in | Wt 180.0 lb

## 2021-04-19 DIAGNOSIS — K5140 Inflammatory polyps of colon without complications: Secondary | ICD-10-CM | POA: Diagnosis not present

## 2021-04-19 DIAGNOSIS — D122 Benign neoplasm of ascending colon: Secondary | ICD-10-CM | POA: Diagnosis not present

## 2021-04-19 DIAGNOSIS — Z1211 Encounter for screening for malignant neoplasm of colon: Secondary | ICD-10-CM

## 2021-04-19 DIAGNOSIS — Z800 Family history of malignant neoplasm of digestive organs: Secondary | ICD-10-CM

## 2021-04-19 MED ORDER — SODIUM CHLORIDE 0.9 % IV SOLN: 500.0000 mL | INTRAVENOUS | Status: DC

## 2021-04-19 NOTE — Progress Notes (Signed)
A and O x3. Report to RN. Tolerated MAC anesthesia well.

## 2021-04-19 NOTE — Patient Instructions (Signed)
Thank you for letting us take care of your healthcare needs today. Please see handouts given to you on Diverticulosis and Polyps.   YOU HAD AN ENDOSCOPIC PROCEDURE TODAY AT [facility]:   Refer to the procedure report that was given to you for any specific questions about what was found during the examination.  If the procedure report does not answer your questions, please call your gastroenterologist to clarify.  If you requested that your care partner not be given the details of your procedure findings, then the procedure report has been included in a sealed envelope for you to review at your convenience later.  YOU SHOULD EXPECT: Some feelings of bloating in the abdomen. Passage of more gas than usual.  Walking can help get rid of the air that was put into your GI tract during the procedure and reduce the bloating. If you had a lower endoscopy (such as a colonoscopy or flexible sigmoidoscopy) you may notice spotting of blood in your stool or on the toilet paper. If you underwent a bowel prep for your procedure, you may not have a normal bowel movement for a few days.  Please Note:  You might notice some irritation and congestion in your nose or some drainage.  This is from the oxygen used during your procedure.  There is no need for concern and it should clear up in a day or so.  SYMPTOMS TO REPORT IMMEDIATELY:  Following lower endoscopy (colonoscopy or flexible sigmoidoscopy):  Excessive amounts of blood in the stool  Significant tenderness or worsening of abdominal pains  Swelling of the abdomen that is new, acute  Fever of 100F or higher  For urgent or emergent issues, a gastroenterologist can be reached at any hour by calling [phone]. Do not use MyChart messaging for urgent concerns.    DIET:  We do recommend a small meal at first, but then you may proceed to your regular diet.  Drink plenty of fluids but you should avoid alcoholic beverages for 24  hours.  ACTIVITY:  You should plan to take it easy for the rest of today and you should NOT DRIVE or use heavy machinery until tomorrow (because of the sedation medicines used during the test).    FOLLOW UP: Our staff will call the number listed on your records 48-72 hours following your procedure to check on you and address any questions or concerns that you may have regarding the information given to you following your procedure. If we do not reach you, we will leave a message.  We will attempt to reach you two times.  During this call, we will ask if you have developed any symptoms of COVID 19. If you develop any symptoms (ie: fever, flu-like symptoms, shortness of breath, cough etc.) before then, please call [phone].  If you test positive for Covid 19 in the 2 weeks post procedure, please call and report this information to [place].    If any biopsies were taken you will be contacted by phone or by letter within the next 1-3 weeks.  Please call us at [phone] if you have not heard about the biopsies in 3 weeks.    SIGNATURES/CONFIDENTIALITY: You and/or your care partner have signed paperwork which will be entered into your electronic medical record.  These signatures attest to the fact that that the information above on your After Visit Summary has been reviewed and is understood.  Full responsibility of the confidentiality of this discharge information lies with  you and/or your care-partner.  °

## 2021-04-19 NOTE — Progress Notes (Signed)
Called to room to assist during endoscopic procedure.  Patient ID and intended procedure confirmed with present staff. Received instructions for my participation in the procedure from the performing physician.  

## 2021-04-19 NOTE — Op Note (Signed)
[facility] Patient Name: Tracey Evans Procedure Date: 04/19/2021 10:12 AM MRN: [id number] Endoscopist: Mallie Mussel L. Loletha Carrow , MD Age: 76 Referring MD:  Date of Birth: Feb 22, 1946 Gender: Female Account #: [id number] Procedure:                Colonoscopy Indications:              Screening in patient at increased risk: Colorectal                            cancer in father 44 or older                           No polyps last colonoscopy 2012 Medicines:                Monitored Anesthesia Care Procedure:                Pre-Anesthesia Assessment:                           - Prior to the procedure, a History and Physical                            was performed, and patient medications and                            allergies were reviewed. The patient's tolerance of                            previous anesthesia was also reviewed. The risks                            and benefits of the procedure and the sedation                            options and risks were discussed with the patient.                            All questions were answered, and informed consent                            was obtained. Prior Anticoagulants: The patient has                            taken no previous anticoagulant or antiplatelet                            agents. ASA Grade Assessment: III - A patient with                            severe systemic disease. After reviewing the risks                            and benefits, the patient was deemed in  satisfactory condition to undergo the procedure.                           After obtaining informed consent, the colonoscope                            was passed under direct vision. Throughout the                            procedure, the patient's blood pressure, pulse, and                            oxygen saturations were monitored continuously. The                            Olympus CF-HQ190L [id number]) Colonoscope  was                            introduced through the anus and advanced to the the                            cecum, identified by appendiceal orifice and                            ileocecal valve. The colonoscopy was performed                            without difficulty. The patient tolerated the                            procedure well. The quality of the bowel                            preparation was good. The ileocecal valve,                            appendiceal orifice, and rectum were photographed. Scope In: 10:22:53 AM Scope Out: 10:41:39 AM Scope Withdrawal Time: 0 hours 13 minutes 51 seconds  Total Procedure Duration: 0 hours 18 minutes 46 seconds  Findings:                 The perianal and digital rectal examinations were                            normal.                           Many diverticula were found in the entire colon.                           An 8-10 mm polyp was found in the distal ascending                            colon. The polyp was pedunculated. The polyp was  removed with a hot snare. Resection and retrieval                            were complete. (removed en bloc, then cut with cold                            snare for retrieval)                           The exam was otherwise without abnormality on                            direct and retroflexion views. Complications:            No immediate complications. Estimated Blood Loss:     Estimated blood loss was minimal. Estimated blood                            loss: none. Impression:               - Diverticulosis in the entire examined colon.                           - One 10 mm polyp in the distal ascending colon,                            removed with a hot snare. Resected and retrieved.                           - The examination was otherwise normal on direct                            and retroflexion views. Recommendation:           - Patient has a contact  number available for                            emergencies. The signs and symptoms of potential                            delayed complications were discussed with the                            patient. Return to normal activities tomorrow.                            Written discharge instructions were provided to the                            patient.                           - Resume previous diet.                           - Continue present medications.                           -  Await pathology results.                           - Repeat colonoscopy may be recommended for                            surveillance (if adenomatous or serrated). That                            will be determined after pathology results from                            today's exam become available for review. Herberto Ledwell L. Loletha Carrow, MD 04/19/2021 10:50:54 AM This report has been signed electronically.

## 2021-04-19 NOTE — Progress Notes (Signed)
Pt's states no medical or surgical changes since previsit or office visit. 

## 2021-04-19 NOTE — Progress Notes (Signed)
History and Physical:  This patient presents for endoscopic testing for: Encounter Diagnosis  Name Primary?   FH: colon cancer Yes  Father over age 76 - patient reports Dx uncertain  Patient denies chronic abdominal pain, rectal bleeding, constipation or diarrhea. Had a severe diverticular bleed summer 2022 (see [facility] clinic note for details)   ROS: Patient denies chest pain or cough   Past Medical History: Past Medical History:  Diagnosis Date   Angina at rest [facility])    "occurs whenever it wants to, but worse during agitation"   Anxiety    Asthma    Binge eating disorder    Breast cancer of lower-outer quadrant of left female breast ([place]) 07/08/2015   "NEVER HAD LEFT BREAST CANCER" (08/15/2015)   Cancer of right breast ([place]) 08/15/2015   Chronic bronchitis ([place])    Chronic lower back pain    Complication of anesthesia    had bronc spasms during intubation surgery 2009 on foot-need albuterol inhaler or neb tx preop   Costochondritis    Depression    Diverticular disease    11-2020 bleed   Fibromyalgia    GERD (gastroesophageal reflux disease)    Hot flashes    Hyperlipidemia    Hypertension    Hypothyroidism    Osteoarthritis    "all over"   Peptic ulcer disease    Personal history of chemotherapy 2017   Shortness of breath dyspnea    Type II diabetes mellitus ([place])    "diet controlled" (08/15/2015)     Past Surgical History: Past Surgical History:  Procedure Laterality Date   ABDOMINAL HYSTERECTOMY     "left my ovaries"   ANKLE ARTHROSCOPY  01/02/2012   Procedure: ANKLE ARTHROSCOPY;  Surgeon: Colin Rhein, MD;  Location: [facility];  Service: Orthopedics;  Laterality: Right;  right ankle arthroscopy with extensive debridement , dridement and drilling talar dome osteochondral lesion   ANKLE FRACTURE SURGERY Right 2009   Dr. Rolena Infante   BREAST BIOPSY  07/2015   CARDIAC CATHETERIZATION     CARPAL TUNNEL RELEASE Bilateral    CATARACT EXTRACTION,  BILATERAL     COLONOSCOPY     DILATION AND CURETTAGE OF UTERUS     "before hysterectomy"   FRACTURE SURGERY     KNEE ARTHROSCOPY Bilateral    LAPAROSCOPIC CHOLECYSTECTOMY     MASTECTOMY COMPLETE / SIMPLE W/ SENTINEL NODE BIOPSY Right 08/15/2015   MASTECTOMY W/ SENTINEL NODE BIOPSY Right 08/15/2015   Procedure: TOTAL MASTECTOMY WITH SENTINEL LYMPH NODE BIOPSY AND BLUE DYE INJECTION ;  Surgeon: Fanny Skates, MD;  Location: [facility];  Service: General;  Laterality: Right;   PORT-A-CATH REMOVAL N/A 11/12/2016   Procedure: REMOVAL PORT-A-CATH;  Surgeon: Jackolyn Confer, MD;  Location: [facility];  Service: General;  Laterality: N/A;   PORTACATH PLACEMENT Left 08/15/2015   PORTACATH PLACEMENT Left 08/15/2015   Procedure: INSERTION PORT-A-CATH ;  Surgeon: Fanny Skates, MD;  Location: [facility];  Service: General;  Laterality: Left;   SHOULDER ARTHROSCOPY Bilateral 2009-2011   "bone spurs"   TONSILLECTOMY      Allergies: Allergies  Allergen Reactions   Contrast Media [Iodinated Contrast Media] Shortness Of Breath    Patient has SOB, tightness in chest and feels like an elephant is sitting on chest, patient should be  scanned at hospital Per Dr. Alvester Chou   Dilaudid [Hydromorphone Hcl] Anaphylaxis    Pt stopped breathing   Adhesive [Tape]     blister   Codeine Nausea  Only    insomnia   Cymbalta [Duloxetine Hcl] Other (See Comments)    Hallucinations.    Lactose Intolerance (Gi) Diarrhea   Latex     REACTION: burns skin- Latex tape only    Nsaids    Lyrica [Pregabalin] Other (See Comments)    Mood swings.     Outpatient Meds: Current Outpatient Medications  Medication Sig Dispense Refill   albuterol (VENTOLIN HFA) 108 (90 Base) MCG/ACT inhaler Inhale 2 puffs into the lungs every 6 (six) hours as needed for wheezing or shortness of breath. 1 each 1   amLODipine (NORVASC) 2.5 MG tablet Take 2.5 mg by mouth daily.     aspirin 81 MG tablet Take 81 mg by mouth daily.     atorvastatin  (LIPITOR) 20 MG tablet Take 20 mg by mouth daily.     Glycerin-Polysorbate 80 (REFRESH DRY EYE THERAPY OP) Place 1 drop into both eyes 3 (three) times daily.     levothyroxine (SYNTHROID) 75 MCG tablet Take 1 tablet (75 mcg total) by mouth daily before breakfast. 90 tablet 0   magnesium oxide (MAG-OX) 400 (240 Mg) MG tablet Take 1 tablet (400 mg total) by mouth daily. (Patient taking differently: Take 400 mg by mouth 2 (two) times daily.)     metoprolol tartrate (LOPRESSOR) 25 MG tablet TAKE 0.5 TABLETS BY MOUTH 2 TIMES DAILY. 60 tablet 0   pantoprazole (PROTONIX) 40 MG tablet Take 1 tablet (40 mg total) by mouth daily. 30 tablet 3   diclofenac Sodium (VOLTAREN) 1 % GEL Place onto the skin.     gabapentin (NEURONTIN) 300 MG capsule Take 1 capsule (300 mg total) by mouth 3 (three) times daily. (Patient taking differently: Take 300 mg by mouth 3 (three) times daily. 1 qam, 1 at 130 pm, 2 at bedtime) 90 capsule 3   metoprolol succinate (TOPROL-XL) 25 MG 24 hr tablet Take 25 mg by mouth daily.     nitroGLYCERIN (NITROSTAT) 0.4 MG SL tablet Place 1 tablet (0.4 mg total) under the tongue every 5 (five) minutes as needed. As needed for chest pain 30 tablet 3   tiZANidine (ZANAFLEX) 2 MG tablet tizanidine 2 mg tablet     Current Facility-Administered Medications  Medication Dose Route Frequency Provider Last Rate Last Admin   0.9 %  sodium chloride infusion  500 mL Intravenous Continuous Danis, Estill Cotta III, MD          ___________________________________________________________________ Objective   Exam:  BP (!) 155/69    Pulse 65    Temp 97.8 F (36.6 C) (Temporal)    Resp 14    Ht 5\' 4"  (1.626 m)    Wt 180 lb (81.6 kg)    SpO2 99%    BMI 30.90 kg/m   CV: RRR without murmur, S1/S2 Resp: clear to auscultation bilaterally, normal RR and effort noted GI: soft, no tenderness, with active bowel sounds.   Assessment: Encounter Diagnosis  Name Primary?   FH: colon cancer Yes      Plan: Colonoscopy  The benefits and risks of the planned procedure were described in detail with the patient or (when appropriate) their health care proxy.  Risks were outlined as including, but not limited to, bleeding, infection, perforation, adverse medication reaction leading to cardiac or pulmonary decompensation, pancreatitis (if ERCP).  The limitation of incomplete mucosal visualization was also discussed.  No guarantees or warranties were given.    The patient is appropriate for an endoscopic procedure in the ambulatory setting.   -  Wilfrid Lund, MD

## 2021-04-21 ENCOUNTER — Telehealth: Payer: Self-pay

## 2021-04-21 NOTE — Telephone Encounter (Signed)
°  Follow up Call-  Call back number 04/19/2021  Post procedure Call Back phone  # [phone]  Permission to leave phone message Yes  Some recent data might be hidden     Patient questions:  Do you have a fever, pain , or abdominal swelling? No. Pain Score  0 *  Have you tolerated food without any problems? Yes.    Have you been able to return to your normal activities? Yes.    Do you have any questions about your discharge instructions: Diet   No. Medications  No. Follow up visit  No.  Do you have questions or concerns about your Care? No.  Actions: * If pain score is 4 or above: No action needed, pain <4.

## 2021-04-24 ENCOUNTER — Encounter: Payer: Self-pay | Admitting: Gastroenterology

## 2021-04-28 ENCOUNTER — Other Ambulatory Visit: Payer: Self-pay

## 2021-04-28 ENCOUNTER — Ambulatory Visit
Admission: EM | Admit: 2021-04-28 | Discharge: 2021-04-28 | Disposition: A | Discharge status: Home / Self Care | Payer: Medicare Other | Attending: Emergency Medicine | Admitting: Emergency Medicine

## 2021-04-28 DIAGNOSIS — L509 Urticaria, unspecified: Secondary | ICD-10-CM | POA: Diagnosis not present

## 2021-04-28 DIAGNOSIS — F430 Acute stress reaction: Secondary | ICD-10-CM

## 2021-04-28 MED ORDER — METHYLPREDNISOLONE 4 MG PO TBPK: ORAL_TABLET | ORAL | 0 refills | Status: DC

## 2021-04-28 MED ORDER — METHYLPREDNISOLONE SODIUM SUCC 125 MG IJ SOLR
125.0000 mg | Freq: Once | INTRAMUSCULAR | Status: AC
  Administered 2021-04-28: 125 mg via INTRAMUSCULAR

## 2021-04-28 NOTE — ED Provider Notes (Signed)
[facility]    CSN: [id number] Arrival date & time: 04/28/21  1031    HISTORY   Chief Complaint  Patient presents with   Pruritis   HPI Tracey Evans is a 76 y.o. female. Pt reports 2 days ago she began having hives and itching of her hands and BLE.  Patient states 2 days ago, she was on the phone with a customer service representative who was very rude to her, states this made her very upset.  Patient states that while she was on the phone call, she did not feel "the blood and heat rising to my head".  Patient states she does not believe that she ate or used  anything that could have caused the itching. Pt denies having any SOB, cough, nasal congestion or itching in throat.   Past Medical History:  Diagnosis Date   Angina at rest [facility])    "occurs whenever it wants to, but worse during agitation"   Anxiety    Asthma    Binge eating disorder    Breast cancer of lower-outer quadrant of left female breast ([place]) 07/08/2015   "NEVER HAD LEFT BREAST CANCER" (08/15/2015)   Cancer of right breast ([place]) 08/15/2015   Chronic bronchitis (HCC)    Chronic lower back pain    Complication of anesthesia    had bronc spasms during intubation surgery 2009 on foot-need albuterol inhaler or neb tx preop   Costochondritis    Depression    Diverticular disease    11-2020 bleed   Fibromyalgia    GERD (gastroesophageal reflux disease)    Hot flashes    Hyperlipidemia    Hypertension    Hypothyroidism    Osteoarthritis    "all over"   Peptic ulcer disease    Personal history of chemotherapy 2017   Shortness of breath dyspnea    Type II diabetes mellitus ([place])    "diet controlled" (08/15/2015)   Patient Active Problem List   Diagnosis Date Noted   History of GI diverticular bleed 12/28/2020   Recurrent falls 10/14/2018   UTI (urinary tract infection) 10/05/2018   Hypotension 10/05/2018   AKI (acute kidney injury) ([place]) 10/05/2018   Cervical disc disease 05/02/2017   Anemia  associated with nutritional deficiency 01/13/2016   Syncope 10/05/2015   Genetic testing 08/21/2015   Family history of breast cancer in female 07/15/2015   Family history of colon cancer 07/15/2015   Breast cancer of lower-outer quadrant of right female breast ([place]) 07/08/2015   Disturbance in sleep behavior 11/18/2014   Fibromyalgia 10/04/2014   Diverticulitis of colon (without mention of hemorrhage)(562.11) 12/01/2013   Dry mouth 04/29/2013   Dry eye 04/29/2013   Obesity (BMI 30-39.9) 03/11/2013   Edema 09/04/2012   Depression 09/04/2012   Chemotherapy-induced peripheral neuropathy ([place]) 05/12/2012   OAB (overactive bladder) 29/52/8413   DM w/o complication type II ([place]) 02/06/2012   Allergy to dogs 04/19/2011   Polyarthralgia 09/18/2010   STRESS INCONTINENCE 04/25/2010   Asthma 04/11/2010   VERTIGO 04/11/2010   Hypothyroidism 01/19/2010   Hyperlipidemia associated with type 2 diabetes mellitus ([place]) 01/19/2010   Essential hypertension 01/19/2010   GERD 01/19/2010   PEPTIC ULCER DISEASE 01/19/2010   OSTEOARTHRITIS 01/19/2010   LOW BACK PAIN 01/19/2010   Past Surgical History:  Procedure Laterality Date   ABDOMINAL HYSTERECTOMY     "left my ovaries"   ANKLE ARTHROSCOPY  01/02/2012   Procedure: ANKLE ARTHROSCOPY;  Surgeon: Colin Rhein, MD;  Location: [facility];  Service: Orthopedics;  Laterality: Right;  right ankle arthroscopy with extensive debridement , dridement and drilling talar dome osteochondral lesion   ANKLE FRACTURE SURGERY Right 2009   Dr. Rolena Infante   BREAST BIOPSY  07/2015   CARDIAC CATHETERIZATION     CARPAL TUNNEL RELEASE Bilateral    CATARACT EXTRACTION, BILATERAL     COLONOSCOPY     DILATION AND CURETTAGE OF UTERUS     "before hysterectomy"   FRACTURE SURGERY     KNEE ARTHROSCOPY Bilateral    LAPAROSCOPIC CHOLECYSTECTOMY     MASTECTOMY COMPLETE / SIMPLE W/ SENTINEL NODE BIOPSY Right 08/15/2015   MASTECTOMY W/ SENTINEL NODE BIOPSY Right  08/15/2015   Procedure: TOTAL MASTECTOMY WITH SENTINEL LYMPH NODE BIOPSY AND BLUE DYE INJECTION ;  Surgeon: Fanny Skates, MD;  Location: [place];  Service: General;  Laterality: Right;   PORT-A-CATH REMOVAL N/A 11/12/2016   Procedure: REMOVAL PORT-A-CATH;  Surgeon: Jackolyn Confer, MD;  Location: [place];  Service: General;  Laterality: N/A;   PORTACATH PLACEMENT Left 08/15/2015   PORTACATH PLACEMENT Left 08/15/2015   Procedure: INSERTION PORT-A-CATH ;  Surgeon: Fanny Skates, MD;  Location: [place];  Service: General;  Laterality: Left;   SHOULDER ARTHROSCOPY Bilateral 2009-2011   "bone spurs"   TONSILLECTOMY     OB History     Gravida  2   Para  2   Term      Preterm      AB      Living  3      SAB      IAB      Ectopic      Multiple  1   Live Births             Home Medications    Prior to Admission medications   Medication Sig Start Date End Date Taking? Authorizing Provider  albuterol (VENTOLIN HFA) 108 (90 Base) MCG/ACT inhaler Inhale 2 puffs into the lungs every 6 (six) hours as needed for wheezing or shortness of breath. 02/27/21   Wendie Agreste, MD  amLODipine (NORVASC) 2.5 MG tablet Take 2.5 mg by mouth daily. 01/09/21   [provider]  aspirin 81 MG tablet Take 81 mg by mouth daily.    [provider]  atorvastatin (LIPITOR) 20 MG tablet Take 20 mg by mouth daily.    [provider]  diclofenac Sodium (VOLTAREN) 1 % GEL Place onto the skin. 02/22/15   [provider]  gabapentin (NEURONTIN) 300 MG capsule Take 1 capsule (300 mg total) by mouth 3 (three) times daily. Patient taking differently: Take 300 mg by mouth 3 (three) times daily. 1 qam, 1 at 130 pm, 2 at bedtime 02/07/21   Wendie Agreste, MD  Glycerin-Polysorbate 80 (REFRESH DRY EYE THERAPY OP) Place 1 drop into both eyes 3 (three) times daily.    [provider]  levothyroxine (SYNTHROID) 75 MCG tablet Take 1 tablet (75 mcg total) by  mouth daily before breakfast. 02/07/21   Wendie Agreste, MD  magnesium oxide (MAG-OX) 400 (240 Mg) MG tablet Take 1 tablet (400 mg total) by mouth daily. Patient taking differently: Take 400 mg by mouth 2 (two) times daily. 12/07/20   Nicholas Lose, MD  metoprolol succinate (TOPROL-XL) 25 MG 24 hr tablet Take 25 mg by mouth daily. 01/09/21   [provider]  metoprolol tartrate (LOPRESSOR) 25 MG tablet TAKE 0.5 TABLETS BY MOUTH 2 TIMES DAILY. 04/04/21   Carlota Raspberry,  Ranell Patrick, MD  nitroGLYCERIN (NITROSTAT) 0.4 MG SL tablet Place 1 tablet (0.4 mg total) under the tongue every 5 (five) minutes as needed. As needed for chest pain 11/18/14   Midge Minium, MD  pantoprazole (PROTONIX) 40 MG tablet Take 1 tablet (40 mg total) by mouth daily. 02/07/21   Wendie Agreste, MD  tiZANidine (ZANAFLEX) 2 MG tablet tizanidine 2 mg tablet 09/10/20   [provider]    Family History Family History  Problem Relation Age of Onset   Colon polyps Mother    Dementia Mother    Stroke Mother    Heart Problems Mother    Dementia Father    Alcohol abuse Father    Colon cancer Father        dx. 83 or younger-pt states never confirmed   Breast cancer Sister 60       inflammatory   Diverticulitis Sister        maternal half-sister; severe - causing partial colectomy   Breast cancer Sister        maternal half-sister; dx. early 77s   Alcohol abuse Brother    Colon polyps Brother    Breast cancer Other 58       niece   Breast cancer Other        niece dx. 57s   Esophageal cancer Neg Hx    Stomach cancer Neg Hx    Rectal cancer Neg Hx    Social History Social History   Tobacco Use   Smoking status: Never   Smokeless tobacco: Never  Vaping Use   Vaping Use: Never used  Substance Use Topics   Alcohol use: Not Currently    Alcohol/week: 0.0 standard drinks    Comment: 08/15/2015 "1/2 glass of wine q 2 months"    Drug use: No   Allergies   Contrast media [iodinated contrast media],  Dilaudid [hydromorphone hcl], Adhesive [tape], Codeine, Cymbalta [duloxetine hcl], Lactose intolerance (gi), Latex, Nsaids, and Lyrica [pregabalin]  Review of Systems Review of Systems Pertinent findings noted in history of present illness.   Physical Exam Triage Vital Signs ED Triage Vitals  Enc Vitals Group     BP 02/03/21 0827 (!) 147/82     Pulse Rate 02/03/21 0827 72     Resp 02/03/21 0827 18     Temp 02/03/21 0827 98.3 F (36.8 C)     Temp Source 02/03/21 0827 Oral     SpO2 02/03/21 0827 98 %     Weight --      Height --      Head Circumference --      Peak Flow --      Pain Score 02/03/21 0826 5     Pain Loc --      Pain Edu? --      Excl. in [place]? --   No data found.  Updated Vital Signs BP 128/85 (BP Location: Left Arm)    Pulse 68    Temp 98 F (36.7 C) (Oral)    Resp 18    SpO2 96%   Physical Exam Vitals and nursing note reviewed.  Constitutional:      General: She is not in acute distress.    Appearance: Normal appearance. She is not ill-appearing.  HENT:     Head: Normocephalic and atraumatic.  Eyes:     General: Lids are normal.        Right eye: No discharge.        Left eye:  No discharge.     Extraocular Movements: Extraocular movements intact.     Conjunctiva/sclera: Conjunctivae normal.     Right eye: Right conjunctiva is not injected.     Left eye: Left conjunctiva is not injected.  Neck:     Trachea: Trachea and phonation normal.  Cardiovascular:     Rate and Rhythm: Normal rate and regular rhythm.     Pulses: Normal pulses.     Heart sounds: Normal heart sounds. No murmur heard.   No friction rub. No gallop.  Pulmonary:     Effort: Pulmonary effort is normal. No accessory muscle usage, prolonged expiration or respiratory distress.     Breath sounds: Normal breath sounds. No stridor, decreased air movement or transmitted upper airway sounds. No decreased breath sounds, wheezing, rhonchi or rales.  Chest:     Chest wall: No tenderness.   Musculoskeletal:        General: Normal range of motion.     Cervical back: Normal range of motion and neck supple. Normal range of motion.  Lymphadenopathy:     Cervical: No cervical adenopathy.  Skin:    General: Skin is warm and dry.     Findings: Rash (Urticarial, both lower extremities to mid calf and both upper extremities to mid forearm) present. No erythema.  Neurological:     General: No focal deficit present.     Mental Status: She is alert and oriented to person, place, and time.  Psychiatric:        Mood and Affect: Mood normal.        Behavior: Behavior normal.    Visual Acuity Right Eye Distance:   Left Eye Distance:   Bilateral Distance:    Right Eye Near:   Left Eye Near:    Bilateral Near:     UC Couse / Diagnostics / Procedures:    EKG  Radiology No results found.  Procedures Procedures (including critical care time)  UC Diagnoses / Final Clinical Impressions(s)   I have reviewed the triage vital signs and the nursing notes.  Pertinent labs & imaging results that were available during my care of the patient were reviewed by me and considered in my medical decision making (see chart for details).    Final diagnoses:  Hives  Acute stress reaction   Patient states she has been taking Benadryl but it makes her sleepy.  Patient provided with an injection of methylprednisolone in the office and a Medrol Dosepak.  Return precautions advised.  Patient states this is happened before but has been over 20 years ago.  ED Prescriptions     Medication Sig Dispense Auth. Provider   methylPREDNISolone (MEDROL DOSEPAK) 4 MG TBPK tablet Take 24 mg on day 1, 20 mg on day 2, 16 mg on day 3, 12 mg on day 4, 8 mg on day 5, 4 mg on day 6. 21 tablet Lynden Oxford Scales, PA-C      PDMP not reviewed this encounter.  Pending results:  Labs Reviewed - No data to display  Medications Ordered in UC: Medications  methylPREDNISolone sodium succinate (SOLU-MEDROL)  125 mg/2 mL injection 125 mg (has no administration in time range)    Disposition Upon Discharge:  Condition: stable for discharge home Home: take medications as prescribed; routine discharge instructions as discussed; follow up as advised.  Patient presented with an acute illness with associated systemic symptoms and significant discomfort requiring urgent management. In my opinion, this is a condition that a prudent lay person (  someone who possesses an average knowledge of health and medicine) may potentially expect to result in complications if not addressed urgently such as respiratory distress, impairment of bodily function or dysfunction of bodily organs.   Routine symptom specific, illness specific and/or disease specific instructions were discussed with the patient and/or caregiver at length.   As such, the patient has been evaluated and assessed, work-up was performed and treatment was provided in alignment with urgent care protocols and evidence based medicine.  Patient/parent/caregiver has been advised that the patient may require follow up for further testing and treatment if the symptoms continue in spite of treatment, as clinically indicated and appropriate.  If the patient was tested for COVID-19, Influenza and/or RSV, then the patient/parent/guardian was advised to isolate at home pending the results of his/her diagnostic coronavirus test and potentially longer if theyre positive. I have also advised pt that if his/her COVID-19 test returns positive, it's recommended to self-isolate for at least 10 days after symptoms first appeared AND until fever-free for 24 hours without fever reducer AND other symptoms have improved or resolved. Discussed self-isolation recommendations as well as instructions for household member/close contacts as per the [facility] and [place] DHHS, and also gave patient the [place] packet with this information.  Patient/parent/caregiver has been advised to return to the [facility]  or PCP in 3-5 days if no better; to PCP or the Emergency Department if new signs and symptoms develop, or if the current signs or symptoms continue to change or worsen for further workup, evaluation and treatment as clinically indicated and appropriate  The patient will follow up with their current PCP if and as advised. If the patient does not currently have a PCP we will assist them in obtaining one.   The patient may need specialty follow up if the symptoms continue, in spite of conservative treatment and management, for further workup, evaluation, consultation and treatment as clinically indicated and appropriate.   Patient/parent/caregiver verbalized understanding and agreement of plan as discussed.  All questions were addressed during visit.  Please see discharge instructions below for further details of plan.  Discharge Instructions:   Discharge Instructions      Thank you for visiting urgent care today.  I am happy to be of help to you.    Please see the list below for recommended medications, dosages and frequencies to provide relief of your current symptoms:     Methylprednisolone IM (Solu-Medrol):  To quickly address your significant skin inflammation, you were provided with an injection of methylprednisolone in the office today.  You should continue to feel the full benefit of the steroid for the next 4 to 6 hours.    Methylprednisolone (Medrol Dosepak): This is a steroid taper that that we will keep your skin inflammation calm while you slowly decrease the amount of steroid in your system.  You are welcome to continue taking Benadryl and using topical hydrocortisone if you feel these are useful for you, they can both be safely used along with methylprednisolone.     Please follow-up within the next 3 to 5 days either with your primary care provider or urgent care if your symptoms do not resolve.  If you do not have a primary care provider, we will assist you in finding  one.       This office note has been dictated using Museum/gallery curator.  Unfortunately, and despite my best efforts, this method of dictation can sometimes lead to occasional typographical or grammatical errors.  I apologize in advance if this occurs.     Lynden Oxford Scales, PA-C 04/28/21 1110

## 2021-04-28 NOTE — Discharge Instructions (Signed)
Thank you for visiting urgent care today.  I am happy to be of help to you.    Please see the list below for recommended medications, dosages and frequencies to provide relief of your current symptoms:     Methylprednisolone IM (Solu-Medrol):  To quickly address your significant skin inflammation, you were provided with an injection of methylprednisolone in the office today.  You should continue to feel the full benefit of the steroid for the next 4 to 6 hours.    Methylprednisolone (Medrol Dosepak): This is a steroid taper that that we will keep your skin inflammation calm while you slowly decrease the amount of steroid in your system.  You are welcome to continue taking Benadryl and using topical hydrocortisone if you feel these are useful for you, they can both be safely used along with methylprednisolone.     Please follow-up within the next 3 to 5 days either with your primary care provider or urgent care if your symptoms do not resolve.  If you do not have a primary care provider, we will assist you in finding one.

## 2021-04-28 NOTE — ED Triage Notes (Addendum)
Pt reports 2 days ago she began having hives and itching to hands and BLE. Patient states she did not eat/ use anything that could have caused the itching. Pt denies having any SOB and denies itching in throat.

## 2021-05-04 ENCOUNTER — Other Ambulatory Visit: Payer: Self-pay | Admitting: Family Medicine

## 2021-05-04 DIAGNOSIS — E039 Hypothyroidism, unspecified: Secondary | ICD-10-CM

## 2021-05-07 ENCOUNTER — Other Ambulatory Visit: Payer: Self-pay | Admitting: Family Medicine

## 2021-05-07 DIAGNOSIS — I10 Essential (primary) hypertension: Secondary | ICD-10-CM

## 2021-05-08 ENCOUNTER — Ambulatory Visit
Admission: EM | Admit: 2021-05-08 | Discharge: 2021-05-08 | Disposition: A | Discharge status: Home / Self Care | Payer: Medicare Other | Attending: Family Medicine | Admitting: Family Medicine

## 2021-05-08 ENCOUNTER — Other Ambulatory Visit: Payer: Self-pay

## 2021-05-08 DIAGNOSIS — L309 Dermatitis, unspecified: Secondary | ICD-10-CM | POA: Diagnosis not present

## 2021-05-08 MED ORDER — PREDNISONE 20 MG PO TABS: 40.0000 mg | ORAL_TABLET | Freq: Every day | ORAL | 0 refills | Status: AC

## 2021-05-08 NOTE — Discharge Instructions (Signed)
Try cetirizine/Zyrtec daily instead of benadryl.  Take prednisone 20 mg 2 daily for 5 days.  Call your doctor for a sooner appointment.  Try white safeguard, ivory soap or dove for bath soap.  Try low allergen laundry detergent, like Tide Free, All free, or arm and hammer

## 2021-05-08 NOTE — ED Triage Notes (Signed)
Pt reports over a week ago she began having itching to bilateral arms, neck, and BLE.

## 2021-05-08 NOTE — ED Provider Notes (Signed)
[facility]    CSN: [id number] Arrival date & time: 05/08/21  4098      History   Chief Complaint Chief Complaint  Patient presents with   Rash    HPI Tracey Evans is a 76 y.o. female.    Rash  Here again for itching and rash. 1/20 was seen for itching and hives and rx'd medrol pack. It improved while on the steroids, and then symptoms came back the next day. No fever or chills. No cough and no URI symptoms. No tongue swelling and no lip swelling.   Past Medical History:  Diagnosis Date   Angina at rest [facility])    "occurs whenever it wants to, but worse during agitation"   Anxiety    Asthma    Binge eating disorder    Breast cancer of lower-outer quadrant of left female breast ([place]) 07/08/2015   "NEVER HAD LEFT BREAST CANCER" (08/15/2015)   Cancer of right breast ([place]) 08/15/2015   Chronic bronchitis (HCC)    Chronic lower back pain    Complication of anesthesia    had bronc spasms during intubation surgery 2009 on foot-need albuterol inhaler or neb tx preop   Costochondritis    Depression    Diverticular disease    11-2020 bleed   Fibromyalgia    GERD (gastroesophageal reflux disease)    Hot flashes    Hyperlipidemia    Hypertension    Hypothyroidism    Osteoarthritis    "all over"   Peptic ulcer disease    Personal history of chemotherapy 2017   Shortness of breath dyspnea    Type II diabetes mellitus ([place])    "diet controlled" (08/15/2015)    Patient Active Problem List   Diagnosis Date Noted   History of GI diverticular bleed 12/28/2020   Recurrent falls 10/14/2018   UTI (urinary tract infection) 10/05/2018   Hypotension 10/05/2018   AKI (acute kidney injury) ([place]) 10/05/2018   Cervical disc disease 05/02/2017   Anemia associated with nutritional deficiency 01/13/2016   Syncope 10/05/2015   Genetic testing 08/21/2015   Family history of breast cancer in female 07/15/2015   Family history of colon cancer 07/15/2015   Breast cancer of  lower-outer quadrant of right female breast ([place]) 07/08/2015   Disturbance in sleep behavior 11/18/2014   Fibromyalgia 10/04/2014   Diverticulitis of colon (without mention of hemorrhage)(562.11) 12/01/2013   Dry mouth 04/29/2013   Dry eye 04/29/2013   Obesity (BMI 30-39.9) 03/11/2013   Edema 09/04/2012   Depression 09/04/2012   Chemotherapy-induced peripheral neuropathy ([place]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/12/2012   OAB (overactive bladder) 11/91/4782   DM w/o complication type II ([place]) 02/06/2012   Allergy to dogs 04/19/2011   Polyarthralgia 09/18/2010   STRESS INCONTINENCE 04/25/2010   Asthma 04/11/2010   VERTIGO 04/11/2010   Hypothyroidism 01/19/2010   Hyperlipidemia associated with type 2 diabetes mellitus ([place]) 01/19/2010   Essential hypertension 01/19/2010   GERD 01/19/2010   PEPTIC ULCER DISEASE 01/19/2010   OSTEOARTHRITIS 01/19/2010   LOW BACK PAIN 01/19/2010    Past Surgical History:  Procedure Laterality Date   ABDOMINAL HYSTERECTOMY     "left my ovaries"   ANKLE ARTHROSCOPY  01/02/2012   Procedure: ANKLE ARTHROSCOPY;  Surgeon: Colin Rhein, MD;  Location: [place];  Service: Orthopedics;  Laterality: Right;  right ankle arthroscopy with extensive debridement , dridement and drilling talar dome osteochondral lesion   ANKLE FRACTURE SURGERY Right 2009   Dr. Rolena Infante   BREAST BIOPSY  07/2015   CARDIAC CATHETERIZATION     CARPAL TUNNEL RELEASE Bilateral    CATARACT EXTRACTION, BILATERAL     COLONOSCOPY     DILATION AND CURETTAGE OF UTERUS     "before hysterectomy"   FRACTURE SURGERY     KNEE ARTHROSCOPY Bilateral    LAPAROSCOPIC CHOLECYSTECTOMY     MASTECTOMY COMPLETE / SIMPLE W/ SENTINEL NODE BIOPSY Right 08/15/2015   MASTECTOMY W/ SENTINEL NODE BIOPSY Right 08/15/2015   Procedure: TOTAL MASTECTOMY WITH SENTINEL LYMPH NODE BIOPSY AND BLUE DYE INJECTION ;  Surgeon: Fanny Skates, MD;  Location: [place];  Service: General;  Laterality: Right;   PORT-A-CATH REMOVAL N/A 11/12/2016    Procedure: REMOVAL PORT-A-CATH;  Surgeon: Jackolyn Confer, MD;  Location: [place];  Service: General;  Laterality: N/A;   PORTACATH PLACEMENT Left 08/15/2015   PORTACATH PLACEMENT Left 08/15/2015   Procedure: INSERTION PORT-A-CATH ;  Surgeon: Fanny Skates, MD;  Location: [place];  Service: General;  Laterality: Left;   SHOULDER ARTHROSCOPY Bilateral 2009-2011   "bone spurs"   TONSILLECTOMY      OB History     Gravida  2   Para  2   Term      Preterm      AB      Living  3      SAB      IAB      Ectopic      Multiple  1   Live Births               Home Medications    Prior to Admission medications   Medication Sig Start Date End Date Taking? Authorizing Provider  predniSONE (DELTASONE) 20 MG tablet Take 2 tablets (40 mg total) by mouth daily with breakfast for 5 days. 05/08/21 05/13/21 Yes Fayetta Sorenson, Gwenlyn Perking, MD  albuterol (VENTOLIN HFA) 108 (90 Base) MCG/ACT inhaler Inhale 2 puffs into the lungs every 6 (six) hours as needed for wheezing or shortness of breath. 02/27/21   Wendie Agreste, MD  amLODipine (NORVASC) 2.5 MG tablet Take 2.5 mg by mouth daily. 01/09/21   [provider]  aspirin 81 MG tablet Take 81 mg by mouth daily.    [provider]  atorvastatin (LIPITOR) 20 MG tablet Take 20 mg by mouth daily.    [provider]  diclofenac Sodium (VOLTAREN) 1 % GEL Place onto the skin. 02/22/15   [provider]  gabapentin (NEURONTIN) 300 MG capsule Take 1 capsule (300 mg total) by mouth 3 (three) times daily. Patient taking differently: Take 300 mg by mouth 3 (three) times daily. 1 qam, 1 at 130 pm, 2 at bedtime 02/07/21   Wendie Agreste, MD  Glycerin-Polysorbate 80 (REFRESH DRY EYE THERAPY OP) Place 1 drop into both eyes 3 (three) times daily.    [provider]  levothyroxine (SYNTHROID) 75 MCG tablet TAKE 1 TABLET BY MOUTH DAILY BEFORE BREAKFAST. 05/04/21   Midge Minium, MD  magnesium oxide  (MAG-OX) 400 (240 Mg) MG tablet Take 1 tablet (400 mg total) by mouth daily. Patient taking differently: Take 400 mg by mouth 2 (two) times daily. 12/07/20   Nicholas Lose, MD  metoprolol succinate (TOPROL-XL) 25 MG 24 hr tablet Take 25 mg by mouth daily. 01/09/21   [provider]  metoprolol tartrate (LOPRESSOR) 25 MG tablet TAKE 0.5 TABLETS BY MOUTH 2 TIMES DAILY. 04/04/21   Wendie Agreste, MD  nitroGLYCERIN (NITROSTAT) 0.4 MG SL tablet Place 1  tablet (0.4 mg total) under the tongue every 5 (five) minutes as needed. As needed for chest pain 11/18/14   Midge Minium, MD  pantoprazole (PROTONIX) 40 MG tablet Take 1 tablet (40 mg total) by mouth daily. 02/07/21   Wendie Agreste, MD  tiZANidine (ZANAFLEX) 2 MG tablet tizanidine 2 mg tablet 09/10/20   [provider]    Family History Family History  Problem Relation Age of Onset   Colon polyps Mother    Dementia Mother    Stroke Mother    Heart Problems Mother    Dementia Father    Alcohol abuse Father    Colon cancer Father        dx. 65 or younger-pt states never confirmed   Breast cancer Sister 18       inflammatory   Diverticulitis Sister        maternal half-sister; severe - causing partial colectomy   Breast cancer Sister        maternal half-sister; dx. early 28s   Alcohol abuse Brother    Colon polyps Brother    Breast cancer Other 103       niece   Breast cancer Other        niece dx. 66s   Esophageal cancer Neg Hx    Stomach cancer Neg Hx    Rectal cancer Neg Hx     Social History Social History   Tobacco Use   Smoking status: Never   Smokeless tobacco: Never  Vaping Use   Vaping Use: Never used  Substance Use Topics   Alcohol use: Not Currently    Alcohol/week: 0.0 standard drinks    Comment: 08/15/2015 "1/2 glass of wine q 2 months"    Drug use: No     Allergies   Contrast media [iodinated contrast media], Dilaudid [hydromorphone hcl], Adhesive [tape], Codeine, Cymbalta [duloxetine  hcl], Lactose intolerance (gi), Latex, Nsaids, and Lyrica [pregabalin]   Review of Systems Review of Systems  Skin:  Positive for rash.    Physical Exam Triage Vital Signs ED Triage Vitals [05/08/21 0844]  Enc Vitals Group     BP 138/90     Pulse Rate 70     Resp 18     Temp 99.1 F (37.3 C)     Temp Source Oral     SpO2 97 %     Weight      Height      Head Circumference      Peak Flow      Pain Score 0     Pain Loc      Pain Edu?      Excl. in [place]?    No data found.  Updated Vital Signs BP 138/90 (BP Location: Right Arm)    Pulse 70    Temp 99.1 F (37.3 C) (Oral)    Resp 18    SpO2 97%   Visual Acuity Right Eye Distance:   Left Eye Distance:   Bilateral Distance:    Right Eye Near:   Left Eye Near:    Bilateral Near:     Physical Exam Constitutional:      General: She is not in acute distress.    Appearance: She is not toxic-appearing.  HENT:     Nose: Nose normal.     Mouth/Throat:     Mouth: Mucous membranes are moist.     Pharynx: No oropharyngeal exudate or posterior oropharyngeal erythema.  Eyes:     Extraocular  Movements: Extraocular movements intact.     Pupils: Pupils are equal, round, and reactive to light.  Cardiovascular:     Rate and Rhythm: Normal rate and regular rhythm.     Heart sounds: No murmur heard. Pulmonary:     Effort: Pulmonary effort is normal.     Breath sounds: Normal breath sounds. No wheezing.  Musculoskeletal:     Cervical back: Neck supple.  Lymphadenopathy:     Cervical: No cervical adenopathy.  Skin:    Capillary Refill: Capillary refill takes less than 2 seconds.     Coloration: Skin is not jaundiced or pale.  Neurological:     General: No focal deficit present.     Mental Status: She is alert and oriented to person, place, and time.  Psychiatric:        Behavior: Behavior normal.     UC Treatments / Results  Labs (all labs ordered are listed, but only abnormal results are displayed) Labs Reviewed - No  data to display  EKG   Radiology No results found.  Procedures Procedures (including critical care time)  Medications Ordered in UC Medications - No data to display  Initial Impression / Assessment and Plan / UC Course  I have reviewed the triage vital signs and the nursing notes.  Pertinent labs & imaging results that were available during my care of the patient were reviewed by me and considered in my medical decision making (see chart for details).     Will do prednisone and change antihistamine. Final Clinical Impressions(s) / UC Diagnoses   Final diagnoses:  Dermatitis     Discharge Instructions      Try cetirizine/Zyrtec daily instead of benadryl.  Take prednisone 20 mg 2 daily for 5 days.  Call your doctor for a sooner appointment.  Try white safeguard, ivory soap or dove for bath soap.  Try low allergen laundry detergent, like Tide Free, All free, or arm and hammer     ED Prescriptions     Medication Sig Dispense Auth. Provider   predniSONE (DELTASONE) 20 MG tablet Take 2 tablets (40 mg total) by mouth daily with breakfast for 5 days. 10 tablet Windy Carina Gwenlyn Perking, MD      PDMP not reviewed this encounter.   Barrett Henle, MD 05/08/21 [phone]

## 2021-05-09 ENCOUNTER — Ambulatory Visit (INDEPENDENT_AMBULATORY_CARE_PROVIDER_SITE_OTHER): Payer: Medicare Other | Admitting: Family Medicine

## 2021-05-09 ENCOUNTER — Encounter: Payer: Self-pay | Admitting: Family Medicine

## 2021-05-09 VITALS — BP 124/78 | HR 84 | Temp 97.7°F | Resp 16 | Wt 194.2 lb

## 2021-05-09 DIAGNOSIS — S20469A Insect bite (nonvenomous) of unspecified back wall of thorax, initial encounter: Secondary | ICD-10-CM | POA: Diagnosis not present

## 2021-05-09 DIAGNOSIS — W57XXXA Bitten or stung by nonvenomous insect and other nonvenomous arthropods, initial encounter: Secondary | ICD-10-CM | POA: Diagnosis not present

## 2021-05-09 MED ORDER — TRIAMCINOLONE ACETONIDE 0.1 % EX OINT: 1.0000 "application " | TOPICAL_OINTMENT | Freq: Two times a day (BID) | CUTANEOUS | 1 refills | Status: DC

## 2021-05-09 MED ORDER — HYDROXYZINE HCL 10 MG PO TABS: 10.0000 mg | ORAL_TABLET | Freq: Three times a day (TID) | ORAL | 0 refills | Status: DC | PRN

## 2021-05-09 NOTE — Progress Notes (Signed)
° °  Subjective:    Patient ID: Tracey Evans, female    DOB: 1945/08/25, 76 y.o.   MRN: [id number]  HPI Hives- pt was seen at [facility] yesterday and prescribed Prednisone 40mg  daily x5 days.  Prior to that was seen 1/20 and she had the itching a week prior to that.  Pt developed new lesions on her way here- upper back.  Previous areas were ankles and forearms.  Denies itching in groin or abdomen.  Also has itching in scalp.  Has not seen any fleas.  She did get a 'new' chair from someone that had pets.  Stressors include- Sister has had a number of recent and serious health issues at her nursing home.  Pt filed a complaint w/ the state and they came in last week to investigate.  Sister is now completely bed bound and may lose her leg.  Sister has now lost her will to live.   Review of Systems For ROS see HPI   This visit occurred during the SARS-CoV-2 public health emergency.  Safety protocols were in place, including screening questions prior to the visit, additional usage of staff PPE, and extensive cleaning of exam room while observing appropriate contact time as indicated for disinfecting solutions.      Objective:   Physical Exam Vitals reviewed.  Constitutional:      General: She is not in acute distress.    Appearance: Normal appearance. She is not ill-appearing.  HENT:     Head: Normocephalic and atraumatic.  Eyes:     Extraocular Movements: Extraocular movements intact.     Conjunctiva/sclera: Conjunctivae normal.     Pupils: Pupils are equal, round, and reactive to light.  Skin:    General: Skin is warm and dry.     Comments: Pt w/ multiple bites on central upper back.  Has scabbed lesions on forearms and ankles bilaterally.  + excoriations.  Neurological:     General: No focal deficit present.     Mental Status: She is alert and oriented to person, place, and time.  Psychiatric:        Mood and Affect: Mood normal.        Behavior: Behavior normal.        Thought Content:  Thought content normal.          Assessment & Plan:  Insect bites- new.  Areas are not consistent w/ hives although the Prednisone will help w/ her itching.  Continue Zyrtec.  Add Hydroxyzine and Triamcinolone prn itching.  Encouraged her to check her car after transporting 'new' chair as she reports the areas on her back just appeared on the way to her appt today.  Pt will check car, chair, and bedding.  If no improvement, she is to let me know.  Pt expressed understanding and is in agreement w/ plan.

## 2021-05-09 NOTE — Patient Instructions (Addendum)
Follow up as scheduled so we can recheck diabetes and make sure everything is up to date CONTINUE the Prednisone as directed- 2 tabs daily at breakfast USE the Triamcinolone ointment twice daily ADD the Hydroxyzine to help w/ itching You can continue the daily Zyrtec (Cetirizine) Treat the car to make sure there's nothing biting you Call with any questions or concerns Hang in there!!!

## 2021-05-12 ENCOUNTER — Encounter: Payer: Self-pay | Admitting: Family Medicine

## 2021-05-15 ENCOUNTER — Encounter: Payer: Self-pay | Admitting: Family Medicine

## 2021-05-22 NOTE — [facility] Notes (Signed)
New Patient Note  RE: Tracey Evans MRN: [id number] DOB: 22-Jul-1945 Date of Office Visit: 05/23/2021  Consult requested by: No ref. provider found Primary care provider: Midge Minium, MD  Chief Complaint: Pruritis  History of Present Illness: I had the pleasure of seeing Tracey Evans for initial evaluation at the [facility] on 05/23/2021. She is a 76 y.o. female, who is self-referred here for the evaluation of itching.  Itching started about 4 weeks ago. Mainly occurs on her lower legs, arms and upper back. She noted some "rash" with this but sometimes she is just itchy with no rash.  No ecchymosis upon resolution but has some excoriation marks.  Associated symptoms include: none.  Frequency of episodes: daily. Suspected triggers are unknown but concerned about apple and peanut butter allergy. She eliminated from her diet but still itching.  Denies any fevers, chills, changes in medications, foods, personal care products or recent infections. She has tried the following therapies: prednisone, benadryl, zyrtec, hydroxyzine with some benefit while taking the medications. She does not like to take a lot of medications as she needs to be not drowsy during the day as she is taking care of her sister at the nursing home.   Systemic steroids yes. Currently on no daily antihistamines.  Previous work up includes: no recent bloodwork. Previous history of pruritus/rash/hives: 35 years ago had 6 weeks of urticaria which was due to increased stress. Patient's sister is in a nursing home and has been under increased stress.   Patient is up to date with the following cancer screening tests: physical exam, colonoscopy. Patient has breast cancer which is under remission.   Past work up includes: 40+ years ago which was positive to wheat, apples and pine tree but she was tolerating them with no issues.  Dietary History: patient has been eating other foods  including milk, eggs, shellfish, fish, soy, wheat, meats, fruits and vegetables. Avoiding tree nuts, sesame due to diverticulosis issues.   She reports reading labels and avoiding peanuts and apples in diet completely.   Currently using Tide detergent, downy fabric softener, soft soap.  05/08/2021 UC visit: "Here again for itching and rash. 1/20 was seen for itching and hives and rx'd medrol pack. It improved while on the steroids, and then symptoms came back the next day. No fever or chills. No cough and no URI symptoms. No tongue swelling and no lip swelling. "  04/28/2021 UC visit: "Tracey Evans is a 76 y.o. female. Pt reports 2 days ago she began having hives and itching of her hands and BLE.  Patient states 2 days ago, she was on the phone with a customer service representative who was very rude to her, states this made her very upset.  Patient states that while she was on the phone call, she did not feel "the blood and heat rising to my head".  Patient states she does not believe that she ate or used  anything that could have caused the itching. Pt denies having any SOB, cough, nasal congestion or itching in throat."  Assessment and Plan: Tracey Evans is a 76 y.o. female with: Pruritus Daily pruritus x 4 weeks. Sometimes has associated rash and sometimes doesn't. Initially concerned about apple and peanut butter allergy but still having symptoms with avoidance. Denies any changes in diet, meds, personal care products. Patient moved to [facility] in August and has increased stress. History of hives 35 years ago which was due to increased stress.  Medical history significant for breast cancer in remission, hypothyroidism, htn, hlp, neuropathy. Does not like to take medications if not needed. Discussed with patient that pruritus has a long list of differential diagnosis. The most common cause is usually xerosis. In her case, I doubt it's due to any food allergies.  If you notice that it gets worse after  eating apples and peanut butter then avoid. I will check some basic food allergies via bloodwork.  Take hydroxyzine 10mg  every 8 hours as needed for itching as it was helping you before. If it makes you drowsy let us know.  See below for proper skin care Make sure you are using fragrance free and dye free products. Avoid the following potential triggers: alcohol, tight clothing, NSAIDs, hot showers and getting overheated. Use triamcinolone 0.1% ointment twice a day as needed for rash flares. Do not use on the face, neck, armpits or groin area. Do not use more than 3 weeks in a row.  Unable to skin test today due to recent antihistamine intake. Get bloodwork to rule out other etiologies.   History of urticaria History of hives 35 years ago due to stress.  Other allergic rhinitis Moved to [place] in the fall 2022 and noticed increased rhinorrhea. On AIT 40+ years ago with good benefit. Unable to skin test today due to recent antihistamine intake. Get bloodwork and will make additional recommendations based on results. The antihistamine recommended as above should also help with these symptoms.   Other asthma Uses albuterol 1-2 times per week and more when singing. May use albuterol rescue inhaler 2 puffs every 4 to 6 hours as needed for shortness of breath, chest tightness, coughing, and wheezing. Monitor frequency of use.  Get spirometry at next visit.  Other adverse food reactions, not elsewhere classified, subsequent encounter Had positive skin testing 40+ years ago to wheat and apples per patient report. Denies having any clinical reactions. Denies eczema history. Unable to skin test today due to recent antihistamine intake. Get bloodwork.  Return in about 4 weeks (around 06/20/2021).  Meds ordered this encounter  Medications   hydrOXYzine (ATARAX) 10 MG tablet    Sig: Take 1 tablet (10 mg total) by mouth 3 (three) times daily as needed for itching.    Dispense:  30 tablet    Refill:   1   Lab Orders         Allergens w/Total IgE Area 2         Alpha-Gal Panel         ANA w/Reflex         C3 and C4         CBC with Differential/Platelet         Chronic Urticaria         Comprehensive metabolic panel         C-reactive protein         Sedimentation rate         Thyroid Cascade Profile         Tryptase         Protein Electrophoresis, Urine Rflx.         Protein electrophoresis, serum         Food Allergy Profile         Apple IgE      Other allergy screening: Asthma: yes Using albuterol 1-2 times per week due to shortness of breath. During singing patient takes it about 3 times per day on Sundays.   Rhino  conjunctivitis: yes Patient was on AIT 40+ years ago with good benefit. Since moved to [place] she has been having some rhinorrhea.  Medication allergy: yes Hymenoptera allergy: no Eczema:no History of recurrent infections suggestive of immunodeficency: no  Diagnostics: Skin Testing: Deferred due to recent antihistamines use.   Past Medical History: Patient Active Problem List   Diagnosis Date Noted   Pruritus 05/23/2021   Other allergic rhinitis 05/23/2021   History of urticaria 05/23/2021   Other adverse food reactions, not elsewhere classified, subsequent encounter 05/23/2021   History of GI diverticular bleed 12/28/2020   Hypomagnesemia 11/02/2020   Neuropathy 08/26/2020   Lumbar facet arthropathy 04/13/2020   Myalgia 04/13/2020   Personal history of breast cancer 05/07/2019   Recurrent falls 10/14/2018   UTI (urinary tract infection) 10/05/2018   Hypotension 10/05/2018   Cervical disc disease 05/02/2017   Anemia associated with nutritional deficiency 01/13/2016   Syncope 10/05/2015   Genetic testing 08/21/2015   Family history of breast cancer in female 07/15/2015   Family history of colon cancer 07/15/2015   Breast cancer of lower-outer quadrant of right female breast ([place]) 07/08/2015   Disturbance in sleep behavior 11/18/2014    Fibromyalgia 10/04/2014   Diverticulitis of colon (without mention of hemorrhage)(562.11) 12/01/2013   Dry mouth 04/29/2013   Dry eye 04/29/2013   Obesity (BMI 30-39.9) 03/11/2013   Edema 09/04/2012   Depression 09/04/2012   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   OAB (overactive bladder) 33/35/4562   DM w/o complication type II ([place]) 02/06/2012   Allergy to dogs 04/19/2011   Polyarthralgia 09/18/2010   STRESS INCONTINENCE 04/25/2010   Other asthma 04/11/2010   VERTIGO 04/11/2010   Hypothyroidism 01/19/2010   Hyperlipidemia associated with type 2 diabetes mellitus ([place]) 01/19/2010   Essential hypertension 01/19/2010   GERD 01/19/2010   PEPTIC ULCER DISEASE 01/19/2010   OSTEOARTHRITIS 01/19/2010   LOW BACK PAIN 01/19/2010   Past Medical History:  Diagnosis Date   Angina at rest [facility])    "occurs whenever it wants to, but worse during agitation"   Anxiety    Asthma    Binge eating disorder    Breast cancer of lower-outer quadrant of left female breast ([place]) 07/08/2015   "NEVER HAD LEFT BREAST CANCER" (08/15/2015)   Cancer of right breast ([place]) 08/15/2015   Chronic bronchitis ([facility])    Chronic lower back pain    Complication of anesthesia    had bronc spasms during intubation surgery 2009 on foot-need albuterol inhaler or neb tx preop   Costochondritis    Depression    Diverticular disease    11-2020 bleed   Fibromyalgia    GERD (gastroesophageal reflux disease)    Hot flashes    Hyperlipidemia    Hypertension    Hypothyroidism    Osteoarthritis    "all over"   Peptic ulcer disease    Personal history of chemotherapy 2017   Shortness of breath dyspnea    Type II diabetes mellitus ([place])    "diet controlled" (08/15/2015)   Past Surgical History: Past Surgical History:  Procedure Laterality Date   ABDOMINAL HYSTERECTOMY     "left my ovaries"   ANKLE ARTHROSCOPY  01/02/2012   Procedure: ANKLE ARTHROSCOPY;  Surgeon: Colin Rhein, MD;  Location:  [facility];  Service: Orthopedics;  Laterality: Right;  right ankle arthroscopy with extensive debridement , dridement and drilling talar dome osteochondral lesion   ANKLE FRACTURE SURGERY Right 2009   Dr. Rolena Infante   BREAST BIOPSY  07/2015   CARDIAC CATHETERIZATION     CARPAL TUNNEL RELEASE Bilateral    CATARACT EXTRACTION, BILATERAL     COLONOSCOPY     DILATION AND CURETTAGE OF UTERUS     "before hysterectomy"   FRACTURE SURGERY     KNEE ARTHROSCOPY Bilateral    LAPAROSCOPIC CHOLECYSTECTOMY     MASTECTOMY COMPLETE / SIMPLE W/ SENTINEL NODE BIOPSY Right 08/15/2015   MASTECTOMY W/ SENTINEL NODE BIOPSY Right 08/15/2015   Procedure: TOTAL MASTECTOMY WITH SENTINEL LYMPH NODE BIOPSY AND BLUE DYE INJECTION ;  Surgeon: Fanny Skates, MD;  Location: [facility];  Service: General;  Laterality: Right;   PORT-A-CATH REMOVAL N/A 11/12/2016   Procedure: REMOVAL PORT-A-CATH;  Surgeon: Jackolyn Confer, MD;  Location: [facility];  Service: General;  Laterality: N/A;   PORTACATH PLACEMENT Left 08/15/2015   PORTACATH PLACEMENT Left 08/15/2015   Procedure: INSERTION PORT-A-CATH ;  Surgeon: Fanny Skates, MD;  Location: [facility];  Service: General;  Laterality: Left;   SHOULDER ARTHROSCOPY Bilateral 2009-2011   "bone spurs"   TONSILLECTOMY     Medication List:  Current Outpatient Medications  Medication Sig Dispense Refill   albuterol (VENTOLIN HFA) 108 (90 Base) MCG/ACT inhaler Inhale 2 puffs into the lungs every 6 (six) hours as needed for wheezing or shortness of breath. 1 each 1   amLODipine (NORVASC) 2.5 MG tablet Take 2.5 mg by mouth daily.     aspirin 81 MG tablet Take 81 mg by mouth daily.     atorvastatin (LIPITOR) 20 MG tablet Take 20 mg by mouth daily.     diclofenac Sodium (VOLTAREN) 1 % GEL Place onto the skin.     gabapentin (NEURONTIN) 300 MG capsule Take 1 capsule (300 mg total) by mouth 3 (three) times daily. (Patient taking differently: Take 300 mg by mouth 3 (three) times  daily. 1 qam, 1 at 130 pm, 2 at bedtime) 90 capsule 3   Glycerin-Polysorbate 80 (REFRESH DRY EYE THERAPY OP) Place 1 drop into both eyes 3 (three) times daily.     hydrOXYzine (ATARAX) 10 MG tablet Take 1 tablet (10 mg total) by mouth 3 (three) times daily as needed for itching. 30 tablet 1   levothyroxine (SYNTHROID) 75 MCG tablet TAKE 1 TABLET BY MOUTH DAILY BEFORE BREAKFAST. 90 tablet 1   magnesium oxide (MAG-OX) 400 (240 Mg) MG tablet Take 1 tablet (400 mg total) by mouth daily. (Patient taking differently: Take 400 mg by mouth 2 (two) times daily.)     metoprolol succinate (TOPROL-XL) 25 MG 24 hr tablet Take 25 mg by mouth daily.     metoprolol tartrate (LOPRESSOR) 25 MG tablet TAKE 0.5 TABLETS BY MOUTH 2 TIMES DAILY. 60 tablet 0   nitroGLYCERIN (NITROSTAT) 0.4 MG SL tablet Place 1 tablet (0.4 mg total) under the tongue every 5 (five) minutes as needed. As needed for chest pain 30 tablet 3   pantoprazole (PROTONIX) 40 MG tablet Take 1 tablet (40 mg total) by mouth daily. 30 tablet 3   tiZANidine (ZANAFLEX) 2 MG tablet      triamcinolone ointment (KENALOG) 0.1 % Apply 1 application topically 2 (two) times daily. 90 g 1   No current facility-administered medications for this visit.   Allergies: Allergies  Allergen Reactions   Contrast Media [Iodinated Contrast Media] Shortness Of Breath    Patient has SOB, tightness in chest and feels like an elephant is sitting on chest, patient should be  scanned at hospital Per Dr. Alvester Chou   Dilaudid [  Hydromorphone Hcl] Anaphylaxis    Pt stopped breathing   Adhesive [Tape]     blister   Codeine Nausea Only    insomnia   Cymbalta [Duloxetine Hcl] Other (See Comments)    Hallucinations.    Lactose Intolerance (Gi) Diarrhea   Latex     REACTION: burns skin- Latex tape only    Nsaids    Lyrica [Pregabalin] Other (See Comments)    Mood swings.    Social History: Social History   Socioeconomic History   Marital status: Single    Spouse name: Not  on file   Number of children: Not on file   Years of education: Not on file   Highest education level: Not on file  Occupational History   Occupation: retired  Tobacco Use   Smoking status: Never   Smokeless tobacco: Never  Vaping Use   Vaping Use: Never used  Substance and Sexual Activity   Alcohol use: Not Currently    Alcohol/week: 0.0 standard drinks    Comment: 08/15/2015 "1/2 glass of wine q 2 months"    Drug use: No   Sexual activity: Not Currently    Comment: 1st intercourse- 14, partners- 74, divorced  Other Topics Concern   Not on file  Social History Narrative   Not on file   Social Determinants of Health   Financial Resource Strain: Not on file  Food Insecurity: Not on file  Transportation Needs: Not on file  Physical Activity: Not on file  Stress: Not on file  Social Connections: Not on file   Lives in an apartment. Smoking: denies Occupation: retired  Programme researcher, broadcasting/film/video HistoryFreight forwarder in the house: yes Carpet in the family room: yes Carpet in the bedroom: yes Heating: electric Cooling: central Pet: no  Family History: Family History  Problem Relation Age of Onset   Colon polyps Mother    Dementia Mother    Stroke Mother    Heart Problems Mother    Dementia Father    Alcohol abuse Father    Colon cancer Father        dx. 33 or younger-pt states never confirmed   Breast cancer Sister 56       inflammatory   Diverticulitis Sister        maternal half-sister; severe - causing partial colectomy   Breast cancer Sister        maternal half-sister; dx. early 26s   Alcohol abuse Brother    Colon polyps Brother    Breast cancer Other 71       niece   Breast cancer Other        niece dx. 91s   Esophageal cancer Neg Hx    Stomach cancer Neg Hx    Rectal cancer Neg Hx    Problem                               Relation Eczema                                Sisters   Review of Systems  Constitutional:  Negative for appetite change,  chills, fever and unexpected weight change.  HENT:  Negative for congestion and rhinorrhea.   Eyes:  Negative for itching.       Dry eyes  Respiratory:  Negative for cough, chest tightness, shortness of breath and wheezing.   Cardiovascular:  Negative for chest pain.  Gastrointestinal:  Negative for abdominal pain.  Genitourinary:  Negative for difficulty urinating.  Skin:  Positive for rash.       itching  Neurological:  Negative for headaches.   Objective: BP 110/60    Pulse 70    Temp 98 F (36.7 C) (Temporal)    Resp 16    Ht 5' 5.5" (1.664 m)    Wt 196 lb (88.9 kg)    SpO2 99%    BMI 32.12 kg/m  Body mass index is 32.12 kg/m. Physical Exam Vitals and nursing note reviewed.  Constitutional:      Appearance: Normal appearance. She is well-developed.  HENT:     Head: Normocephalic and atraumatic.     Right Ear: Tympanic membrane and external ear normal.     Left Ear: Tympanic membrane and external ear normal.     Nose: Nose normal.     Mouth/Throat:     Mouth: Mucous membranes are moist.     Pharynx: Oropharynx is clear.  Eyes:     Conjunctiva/sclera: Conjunctivae normal.  Cardiovascular:     Rate and Rhythm: Normal rate and regular rhythm.     Heart sounds: Normal heart sounds. No murmur heard.   No friction rub. No gallop.  Pulmonary:     Effort: Pulmonary effort is normal.     Breath sounds: Normal breath sounds. No wheezing, rhonchi or rales.  Musculoskeletal:     Cervical back: Neck supple.  Skin:    General: Skin is warm.     Findings: Rash present.     Comments: One erythematous patch on left lower extremity. Skin is moist. Few excoriations marks on left forearm. Patient actively itching throughout the visit.   Neurological:     Mental Status: She is alert and oriented to person, place, and time.  Psychiatric:        Behavior: Behavior normal.  The plan was reviewed with the patient/family, and all questions/concerned were addressed.  It was my pleasure to  see Tracey Evans today and participate in her care. Please feel free to contact me with any questions or concerns.  Sincerely,  Rexene Alberts, DO Allergy & Immunology  [facility] office: [place] office: [phone]

## 2021-05-23 ENCOUNTER — Encounter: Payer: Self-pay | Admitting: Allergy

## 2021-05-23 ENCOUNTER — Other Ambulatory Visit: Payer: Self-pay

## 2021-05-23 ENCOUNTER — Ambulatory Visit (INDEPENDENT_AMBULATORY_CARE_PROVIDER_SITE_OTHER): Payer: Medicare Other | Admitting: Allergy

## 2021-05-23 VITALS — BP 110/60 | HR 70 | Temp 98.0°F | Resp 16 | Ht 65.5 in | Wt 196.0 lb

## 2021-05-23 DIAGNOSIS — T781XXD Other adverse food reactions, not elsewhere classified, subsequent encounter: Secondary | ICD-10-CM | POA: Insufficient documentation

## 2021-05-23 DIAGNOSIS — J45998 Other asthma: Secondary | ICD-10-CM | POA: Diagnosis not present

## 2021-05-23 DIAGNOSIS — L299 Pruritus, unspecified: Secondary | ICD-10-CM | POA: Insufficient documentation

## 2021-05-23 DIAGNOSIS — Z872 Personal history of diseases of the skin and subcutaneous tissue: Secondary | ICD-10-CM | POA: Insufficient documentation

## 2021-05-23 DIAGNOSIS — J3089 Other allergic rhinitis: Secondary | ICD-10-CM | POA: Diagnosis not present

## 2021-05-23 MED ORDER — HYDROXYZINE HCL 10 MG PO TABS: 10.0000 mg | ORAL_TABLET | Freq: Three times a day (TID) | ORAL | 1 refills | Status: DC | PRN

## 2021-05-23 NOTE — Assessment & Plan Note (Signed)
Had positive skin testing 40+ years ago to wheat and apples per patient report. Denies having any clinical reactions. Denies eczema history.  Unable to skin test today due to recent antihistamine intake.  Get bloodwork.

## 2021-05-23 NOTE — Assessment & Plan Note (Addendum)
·   History of hives 35 years ago due to stress.

## 2021-05-23 NOTE — Assessment & Plan Note (Addendum)
Daily pruritus x 4 weeks. Sometimes has associated rash and sometimes doesn't. Initially concerned about apple and peanut butter allergy but still having symptoms with avoidance. Denies any changes in diet, meds, personal care products. Patient moved to [facility] in August and has increased stress. History of hives 35 years ago which was due to increased stress. Medical history significant for breast cancer in remission, hypothyroidism, htn, hlp, neuropathy. Does not like to take medications if not needed.  Discussed with patient that pruritus has a long list of differential diagnosis. The most common cause is usually xerosis. In her case, I doubt it's due to any food allergies.  o If you notice that it gets worse after eating apples and peanut butter then avoid. o I will check some basic food allergies via bloodwork.   Take hydroxyzine 10mg  every 8 hours as needed for itching as it was helping you before. o If it makes you drowsy let us know.   See below for proper skin care o Make sure you are using fragrance free and dye free products.  Avoid the following potential triggers: alcohol, tight clothing, NSAIDs, hot showers and getting overheated.  Use triamcinolone 0.1% ointment twice a day as needed for rash flares. Do not use on the face, neck, armpits or groin area. Do not use more than 3 weeks in a row.   Unable to skin test today due to recent antihistamine intake.  Get bloodwork to rule out other etiologies.

## 2021-05-23 NOTE — Assessment & Plan Note (Signed)
Moved to [place] in the fall 2022 and noticed increased rhinorrhea. On AIT 40+ years ago with good benefit.  Unable to skin test today due to recent antihistamine intake.  Get bloodwork and will make additional recommendations based on results.  The antihistamine recommended as above should also help with these symptoms.

## 2021-05-23 NOTE — Assessment & Plan Note (Signed)
Uses albuterol 1-2 times per week and more when singing. May use albuterol rescue inhaler 2 puffs every 4 to 6 hours as needed for shortness of breath, chest tightness, coughing, and wheezing. Monitor frequency of use.   Get spirometry at next visit.

## 2021-05-23 NOTE — Patient Instructions (Addendum)
Itching: Not sure what's causing your itching.  Unlikely to be related to anything you are eating. If you notice that it gets worse after eating apples and peanut butter then avoid. I will check some basic food allergies via bloodwork.  Take hydroxyzine 10mg  every 8 hours as needed for itching as it was helping you before. If it makes you drowsy let us know.  See below for proper skin care Make sure you are using fragrance free and dye free products. Avoid the following potential triggers: alcohol, tight clothing, NSAIDs, hot showers and getting overheated. Use triamcinolone 0.1% ointment twice a day as needed for rash flares. Do not use on the face, neck, armpits or groin area. Do not use more than 3 weeks in a row.   Get bloodwork:  We are ordering labs, so please allow 1-2 weeks for the results to come back. With the newly implemented Cures Act, the labs might be visible to you at the same time that they become visible to me. However, I will not address the results until all of the results are back, so please be patient.    Follow up in 1 months or sooner if needed.    Skin care recommendations  Bath time: Always use lukewarm water. AVOID very hot or cold water. Keep bathing time to 5-10 minutes. Do NOT use bubble bath. Use a mild soap and use just enough to wash the dirty areas. Do NOT scrub skin vigorously.  After bathing, pat dry your skin with a towel. Do NOT rub or scrub the skin.  Moisturizers and prescriptions:  ALWAYS apply moisturizers immediately after bathing (within 3 minutes). This helps to lock-in moisture. Use the moisturizer several times a day over the whole body. Good summer moisturizers include: Aveeno, CeraVe, Cetaphil. Good winter moisturizers include: Aquaphor, Vaseline, Cerave, Cetaphil, Eucerin, Vanicream. When using moisturizers along with medications, the moisturizer should be applied about one hour after applying the medication to prevent diluting effect  of the medication or moisturize around where you applied the medications. When not using medications, the moisturizer can be continued twice daily as maintenance.  Laundry and clothing: Avoid laundry products with added color or perfumes. Use unscented hypo-allergenic laundry products such as Tide free, Cheer free & gentle, and All free and clear.  If the skin still seems dry or sensitive, you can try double-rinsing the clothes. Avoid tight or scratchy clothing such as wool. Do not use fabric softeners or dyer sheets.

## 2021-05-26 ENCOUNTER — Telehealth: Payer: Self-pay | Admitting: Gastroenterology

## 2021-05-26 NOTE — Telephone Encounter (Signed)
Patient called this morning stating she has had diarrhea ever since she had her colonoscopy and has tried everything she knows to get it to stop without any result.  She is asking for some advice as to what she can do.  Please call patient and advise.

## 2021-05-26 NOTE — Telephone Encounter (Signed)
Spoke with pt. Pt states she has had explosive loose mushy stool since her colonoscopy on 04/19/21. Pt denies having any pain or fever. Pt reports that she is going 3-4x per day. Pt also reports she is experiencing urgency and has been incontinent of stool once about 2 weeks ago. Pt has not taken any medication to stop diarrhea because she wasn't sure if it was the right thing to do.   Dr. Lorenso Courier as DOD please advise.

## 2021-05-26 NOTE — Telephone Encounter (Signed)
Spoke with pt and gave her Dr. Libby Maw recommendations. Scheduled pt for appt with Vicie Mutters, PA on 05/31/21 at 1:30 pm. Pt verbalized understanding and had no other concerns at end of call.

## 2021-05-27 ENCOUNTER — Other Ambulatory Visit: Payer: Self-pay | Admitting: Family Medicine

## 2021-05-27 DIAGNOSIS — I10 Essential (primary) hypertension: Secondary | ICD-10-CM

## 2021-05-27 DIAGNOSIS — K219 Gastro-esophageal reflux disease without esophagitis: Secondary | ICD-10-CM

## 2021-05-29 MED ORDER — METOPROLOL TARTRATE 25 MG PO TABS: ORAL_TABLET | ORAL | 0 refills | Status: DC

## 2021-05-29 NOTE — Progress Notes (Signed)
05/31/2021 ANDRES ESCANDON [id number] 01-21-1946   ASSESSMENT AND PLAN:   Benign neoplasm of ascending colon 04/19/2021 colonoscopy with [name] screening, history of colorectal cancer father Diverticulosis throughout, one benign inflammatory 10 mm polyp distal ascending colon-no repeat colonoscopy due to age  Peptic ulcer she reports symptoms are currently well controlled, and denies breakthrough reflux, burning in chest, hoarseness or cough. Lifestyle changes discussed, avoid NSAIDS Continue current medications  Diarrhea of presumed infectious origin -     DG Abd 1 View; Future -     Wheat Dextrin (BENEFIBER) POWD; Use as directed, daily -     Clostridium difficile Toxin B, Qualitative, Real-Time PCR; Future -     GI Profile, Stool, PCR; Future -     Magnesium; Future-will get with other labs from [place] [facility] - pending CBC, thyroid from [place] [facility] for recent urticaria - will add on magnesium as she is taking high doses of magnesium since August 2022, could be contributing, discussed increasing fiber - will get get stool samples to rule out infection    Future Appointments  Date Time Provider [place]  06/05/2021  8:30 AM Midge Minium, MD [facility]  08/17/2021 11:30 AM Garnet Sierras, DO [facility] None  12/06/2021 11:30 AM Nicholas Lose, MD [facility] None    History of Present Illness:  76 y.o. female  with a past medical history of type 2 diabetes, hyperlipidemia, history of GI diverticular bleed, family history of colon cancer, history of right breast cancer 2017, anemia, GERD and peptic ulcer disease and others listed below, known to [name] returns to clinic today for evaluation of diarrhea. Patient had colonoscopy 04/19/2021, states since that time has been having loose mushy stools 3-4 times daily. Has had urgency with the stools, 1 episode of fecal incontinence. Started on Imodium on 05/26/2021 and here for follow-up.  States since she  had the colonoscopy, would have 3 or more chocolate pudding BM's a day, has increase AB noises.  She has no AB pain.  She had imodium 2 x only and states having less stools.  She is on magnesium 800 mg a day since August 2022, 400 mg twice a day.  She has been on prednisone 4-5 weeks ago for hives, Had Amoxicillin in August 2022.   Patient denies blood in stool, fever, chills. Denies close contacts ill, recent camping, recent travel.  Has a 3 kids, boy and girl twin in 75's, and daughter. Not around here.   Previous GI history: 04/19/2021 colonoscopy with [name] screening, history of colorectal cancer father Diverticulosis throughout, one benign inflammatory 10 mm polyp distal ascending colon-no repeat colonoscopy due to age last colonoscopy 2012  CBC  10/17/2018  HGB 12.1 MCV 92.2 without evidence of anemia WBC 7.5 Platelets 410.0 Kidney function 10/17/2018  BUN 18 Cr 1.31  GFR 51  Potassium 3.8   LFTs 10/06/2018  AST 21 ALT 20 albumin 3.2 Alkphos 93 TBili 0.7 06/08/2018 LIPASE 31 01/25/2021 TSH 5.18  Current Medications:   Current Outpatient Medications (Endocrine & Metabolic):    levothyroxine (SYNTHROID) 75 MCG tablet, TAKE 1 TABLET BY MOUTH DAILY BEFORE BREAKFAST.  Current Outpatient Medications (Cardiovascular):    amLODipine (NORVASC) 2.5 MG tablet, Take 2.5 mg by mouth daily.   atorvastatin (LIPITOR) 20 MG tablet, Take 20 mg by mouth daily.   metoprolol tartrate (LOPRESSOR) 25 MG tablet, TAKE 0.5 TABLETS BY MOUTH 2 TIMES DAILY.   nitroGLYCERIN (NITROSTAT) 0.4 MG SL tablet, Place 1 tablet (  0.4 mg total) under the tongue every 5 (five) minutes as needed. As needed for chest pain (Patient not taking: Reported on 05/31/2021)  Current Outpatient Medications (Respiratory):    albuterol (VENTOLIN HFA) 108 (90 Base) MCG/ACT inhaler, Inhale 2 puffs into the lungs every 6 (six) hours as needed for wheezing or shortness of breath.  Current Outpatient Medications (Analgesics):     aspirin 81 MG tablet, Take 81 mg by mouth daily.   Current Outpatient Medications (Other):    diclofenac Sodium (VOLTAREN) 1 % GEL, Place onto the skin.   gabapentin (NEURONTIN) 300 MG capsule, Take 1 capsule (300 mg total) by mouth 3 (three) times daily. (Patient taking differently: Take 300 mg by mouth 3 (three) times daily. 1 qam, 1 at 130 pm, 2 at bedtime)   Glycerin-Polysorbate 80 (REFRESH DRY EYE THERAPY OP), Place 1 drop into both eyes 3 (three) times daily.   hydrOXYzine (ATARAX) 10 MG tablet, Take 1 tablet (10 mg total) by mouth 3 (three) times daily as needed for itching.   magnesium oxide (MAG-OX) 400 (240 Mg) MG tablet, Take 1 tablet (400 mg total) by mouth daily. (Patient taking differently: Take 400 mg by mouth 2 (two) times daily.)   tiZANidine (ZANAFLEX) 2 MG tablet,    triamcinolone ointment (KENALOG) 0.1 %, Apply 1 application topically 2 (two) times daily.   Wheat Dextrin (BENEFIBER) POWD, Use as directed, daily   omeprazole (PRILOSEC) 20 MG capsule, Take 1 capsule (20 mg total) by mouth daily. (Patient not taking: Reported on 05/31/2021)  Surgical History:  She  has a past surgical history that includes Tonsillectomy; Shoulder arthroscopy (Bilateral, 2009-2011); Colonoscopy; Ankle fracture surgery (Right, 2009); Ankle arthroscopy (01/02/2012); Knee arthroscopy (Bilateral); Carpal tunnel release (Bilateral); Cardiac catheterization; Mastectomy complete / simple w/ sentinel node biopsy (Right, 08/15/2015); Laparoscopic cholecystectomy; Fracture surgery; Dilation and curettage of uterus; Abdominal hysterectomy; Portacath placement (Left, 08/15/2015); Breast biopsy (07/2015); Mastectomy w/ sentinel node biopsy (Right, 08/15/2015); Portacath placement (Left, 08/15/2015); Port-a-cath removal (N/A, 11/12/2016); and Cataract extraction, bilateral. Family History:  Her family history includes Alcohol abuse in her brother and father; Breast cancer in her sister and another family member; Breast cancer  (age of onset: 59) in an other family member; Breast cancer (age of onset: 72) in her sister; Colon cancer in her father; Colon polyps in her brother and mother; Dementia in her father and mother; Diverticulitis in her sister; Heart Problems in her mother; Stroke in her mother. Social History:   reports that she has never smoked. She has never used smokeless tobacco. She reports that she does not currently use alcohol. She reports that she does not use drugs.  Current Medications, Allergies, Past Medical History, Past Surgical History, Family History and Social History were reviewed in [employer] record.  Physical Exam: BP 100/66 (BP Location: Left Arm, Patient Position: Sitting, Cuff Size: Normal)    Pulse 72    Ht 5\' 4"  (1.626 m) Comment: height measured without shoes   Wt 196 lb 8 oz (89.1 kg)    BMI 33.73 kg/m  General:   Pleasant, well developed female in no acute distress Eyes: sclerae anicteric,conjunctive pink  Heart:  regular rate and rhythm Pulm: Clear anteriorly; no wheezing Abdomen:  Soft, Obese AB, skin exam normal, Normal bowel sounds.  no  tenderness . Without guarding and Without rebound, without hepatomegaly. Extremities:  Without edema. Peripheral pulses intact.  Neurologic:  Alert and  oriented x4;  grossly normal neurologically. Skin:   Dry and  intact without significant lesions or rashes. Psychiatric: Demonstrates good judgement and reason without abnormal affect or behaviors.  Vladimir Crofts, PA-C 05/31/21

## 2021-05-31 ENCOUNTER — Encounter: Payer: Self-pay | Admitting: Physician Assistant

## 2021-05-31 ENCOUNTER — Other Ambulatory Visit: Payer: Self-pay

## 2021-05-31 ENCOUNTER — Ambulatory Visit (INDEPENDENT_AMBULATORY_CARE_PROVIDER_SITE_OTHER)
Admission: RE | Admit: 2021-05-31 | Discharge: 2021-05-31 | Disposition: A | Discharge status: Home / Self Care | Payer: Medicare Other | Source: Ambulatory Visit | Attending: Physician Assistant | Admitting: Physician Assistant

## 2021-05-31 ENCOUNTER — Ambulatory Visit (INDEPENDENT_AMBULATORY_CARE_PROVIDER_SITE_OTHER): Payer: Medicare Other | Admitting: Physician Assistant

## 2021-05-31 VITALS — BP 100/66 | HR 72 | Ht 64.0 in | Wt 196.5 lb

## 2021-05-31 DIAGNOSIS — R197 Diarrhea, unspecified: Secondary | ICD-10-CM | POA: Diagnosis not present

## 2021-05-31 DIAGNOSIS — D122 Benign neoplasm of ascending colon: Secondary | ICD-10-CM

## 2021-05-31 DIAGNOSIS — K279 Peptic ulcer, site unspecified, unspecified as acute or chronic, without hemorrhage or perforation: Secondary | ICD-10-CM | POA: Diagnosis not present

## 2021-05-31 DIAGNOSIS — K5900 Constipation, unspecified: Secondary | ICD-10-CM | POA: Diagnosis not present

## 2021-05-31 MED ORDER — BENEFIBER PO POWD: ORAL | 0 refills | Status: DC

## 2021-05-31 NOTE — Patient Instructions (Addendum)
If you are age 76 or older, your body mass index should be between 23-30. Your Body mass index is 33.73 kg/m. If this is out of the aforementioned range listed, please consider follow up with your Primary Care Provider.  If you are age 79 or younger, your body mass index should be between 19-25. Your Body mass index is 33.73 kg/m. If this is out of the aformentioned range listed, please consider follow up with your Primary Care Provider.   Your provider has requested that you go to the basement level for lab work before leaving today. Press "B" on the elevator. The lab is located at the first door on the left as you exit the elevator.  Your provider has requested that you have an abdominal x ray before leaving today. Please go to the basement floor to our Radiology department for the test.    The [place] [facility] providers would like to encourage you to use [facility] to communicate with providers for non-urgent requests or questions.  Due to long hold times on the telephone, sending your provider a message by [facility] may be a faster and more efficient way to get a response.  Please allow 48 business hours for a response.  Please remember that this is for non-urgent requests.    FIBER SUPPLEMENT  Benefiber or Citracel is good for constipation/diarrhea/irritable bowel syndrome, it helps with weight loss and can help lower your bad cholesterol. Please do 1 TBSP in the morning in water, coffee, or tea. It can take up to a month before you can see a difference with your bowel movements. It is cheapest from [employer], [employer], [employer].   Ways to prevent diarrhea with magnesium:  1) Don't take all your magnesium at the same time, have 2-3 smaller doses through out the day 2) Try taking your magnesium with high fiber meals.  3) If this does not help, take the magnesium on an empty stomach. Fiber for some people can bind the magnesium too well and prevent absorption in your gut.  4) Lastly try different types of  magnesium. Most people are taking magnesium citrate, you can also try dimalate capsules which are slow release. You can also find magnesium lotions/sprays for the skin that bypass the gut. Another one that has good absorption is ReMag (pico-iconic magnesium formula), this has great cellular absorption so less of a laxative effect. You can find these type at health food stores or online.   Fiber Chart In the chart below you can look up how much fiber you are getting in an average day.  If you are not getting enough fiber, you should add a fiber supplement to your diet.  Examples of this include Metamucil, FiberCon and Citrucel.  These can be purchased at your local grocery store or pharmacy.      [email or web address]-guerrero.[email or web address]  Due to recent changes in healthcare laws, you may see the results of your imaging and laboratory studies on MyChart before your provider has had a chance to review them.  We understand that in some cases there may be results that are confusing or concerning to you. Not all laboratory results come back in the same time frame and the provider may be waiting for multiple results in order to interpret others.  Please give [place] 48 hours in order for your provider to thoroughly review all the results before contacting the office for clarification of your results.    It was a pleasure to see you today!  Thank you for  trusting me with your gastrointestinal care!    Vicie Mutters , PA-C

## 2021-06-01 DIAGNOSIS — L299 Pruritus, unspecified: Secondary | ICD-10-CM | POA: Diagnosis not present

## 2021-06-01 DIAGNOSIS — T781XXD Other adverse food reactions, not elsewhere classified, subsequent encounter: Secondary | ICD-10-CM | POA: Diagnosis not present

## 2021-06-01 DIAGNOSIS — J3089 Other allergic rhinitis: Secondary | ICD-10-CM | POA: Diagnosis not present

## 2021-06-01 DIAGNOSIS — Z872 Personal history of diseases of the skin and subcutaneous tissue: Secondary | ICD-10-CM | POA: Diagnosis not present

## 2021-06-02 NOTE — Progress Notes (Signed)
____________________________________________________________  Attending physician addendum:  Thank you for sending this case to me. I have reviewed the entire note and agree with the plan.  Yes, should rule out infectious cause.  If negative, there should not otherwise be a reason for a colonoscopy to precipitate diarrhea. Wonder if the diarrhea could be somehow related to her recent onset urticaria, which began about a week after the colonoscopy.  Wilfrid Lund, MD  ____________________________________________________________

## 2021-06-03 ENCOUNTER — Other Ambulatory Visit: Payer: Self-pay | Admitting: Family Medicine

## 2021-06-03 DIAGNOSIS — I10 Essential (primary) hypertension: Secondary | ICD-10-CM

## 2021-06-05 ENCOUNTER — Ambulatory Visit: Payer: Medicare Other | Admitting: Family Medicine

## 2021-06-08 ENCOUNTER — Encounter: Payer: Self-pay | Admitting: Family Medicine

## 2021-06-08 DIAGNOSIS — M5451 Vertebrogenic low back pain: Secondary | ICD-10-CM | POA: Diagnosis not present

## 2021-06-08 LAB — PROTEIN ELECTROPHORESIS, SERUM
A/G Ratio: 1.2 (ref 0.7–1.7)
Albumin ELP: 3.8 g/dL (ref 2.9–4.4)
Alpha 1: 0.2 g/dL (ref 0.0–0.4)
Alpha 2: 0.8 g/dL (ref 0.4–1.0)
Beta: 1 g/dL (ref 0.7–1.3)
Gamma Globulin: 1.1 g/dL (ref 0.4–1.8)
Globulin, Total: 3.1 g/dL (ref 2.2–3.9)

## 2021-06-08 LAB — CBC WITH DIFFERENTIAL/PLATELET
Basophils Absolute: 0 10*3/uL (ref 0.0–0.2)
Basos: 1 %
EOS (ABSOLUTE): 0.2 10*3/uL (ref 0.0–0.4)
Eos: 5 %
Hematocrit: 43.1 % (ref 34.0–46.6)
Hemoglobin: 14 g/dL (ref 11.1–15.9)
Immature Grans (Abs): 0 10*3/uL (ref 0.0–0.1)
Immature Granulocytes: 0 %
Lymphocytes Absolute: 2.5 10*3/uL (ref 0.7–3.1)
Lymphs: 47 %
MCH: 29.7 pg (ref 26.6–33.0)
MCHC: 32.5 g/dL (ref 31.5–35.7)
MCV: 92 fL (ref 79–97)
Monocytes Absolute: 0.4 10*3/uL (ref 0.1–0.9)
Monocytes: 8 %
Neutrophils Absolute: 2 10*3/uL (ref 1.4–7.0)
Neutrophils: 39 %
Platelets: 321 10*3/uL (ref 150–450)
RBC: 4.71 x10E6/uL (ref 3.77–5.28)
RDW: 13.1 % (ref 11.7–15.4)
WBC: 5.2 10*3/uL (ref 3.4–10.8)

## 2021-06-08 LAB — COMPREHENSIVE METABOLIC PANEL
ALT: 20 IU/L (ref 0–32)
AST: 21 IU/L (ref 0–40)
Albumin/Globulin Ratio: 1.8 (ref 1.2–2.2)
Albumin: 4.4 g/dL (ref 3.7–4.7)
Alkaline Phosphatase: 139 IU/L — ABNORMAL HIGH (ref 44–121)
BUN/Creatinine Ratio: 17 (ref 12–28)
BUN: 15 mg/dL (ref 8–27)
Bilirubin Total: 0.3 mg/dL (ref 0.0–1.2)
CO2: 24 mmol/L (ref 20–29)
Calcium: 9.7 mg/dL (ref 8.7–10.3)
Chloride: 108 mmol/L — ABNORMAL HIGH (ref 96–106)
Creatinine, Ser: 0.9 mg/dL (ref 0.57–1.00)
Globulin, Total: 2.5 g/dL (ref 1.5–4.5)
Glucose: 107 mg/dL — ABNORMAL HIGH (ref 70–99)
Potassium: 4.8 mmol/L (ref 3.5–5.2)
Sodium: 147 mmol/L — ABNORMAL HIGH (ref 134–144)
Total Protein: 6.9 g/dL (ref 6.0–8.5)
eGFR: 67 mL/min/{1.73_m2} (ref >59)

## 2021-06-08 LAB — THYROID CASCADE PROFILE: TSH: 1.88 u[IU]/mL (ref 0.450–4.500)

## 2021-06-08 LAB — PROTEIN ELECTROPHORESIS, URINE REFLEX
Albumin ELP, Urine: 31.9 %
Alpha-1-Globulin, U: 5.4 %
Alpha-2-Globulin, U: 14.4 %
Beta Globulin, U: 31.1 %
Gamma Globulin, U: 17.2 %
Protein, Ur: 25.6 mg/dL

## 2021-06-08 LAB — ALLERGENS W/TOTAL IGE AREA 2

## 2021-06-08 LAB — ALLERGEN, APPLE F49: Allergen Apple, IgE: <0.1 kU/L

## 2021-06-08 LAB — FOOD ALLERGY PROFILE
Allergen Corn, IgE: <0.1 kU/L
Clam IgE: <0.1 kU/L
Codfish IgE: <0.1 kU/L
Egg White IgE: <0.1 kU/L
Milk IgE: <0.1 kU/L
Peanut IgE: <0.1 kU/L
Scallop IgE: <0.1 kU/L
Sesame Seed IgE: <0.1 kU/L
Shrimp IgE: <0.1 kU/L
Soybean IgE: <0.1 kU/L
Walnut IgE: <0.1 kU/L
Wheat IgE: <0.1 kU/L

## 2021-06-08 LAB — ALPHA-GAL PANEL
Allergen Lamb IgE: <0.1 kU/L
Beef IgE: <0.1 kU/L
IgE (Immunoglobulin E), Serum: 252 IU/mL (ref 6–495)
O215-IgE Alpha-Gal: <0.1 kU/L
Pork IgE: <0.1 kU/L

## 2021-06-08 LAB — ANA W/REFLEX: Anti Nuclear Antibody (ANA): NEGATIVE

## 2021-06-08 LAB — C-REACTIVE PROTEIN: CRP: 3 mg/L (ref 0–10)

## 2021-06-08 LAB — C3 AND C4
Complement C3, Serum: 179 mg/dL — ABNORMAL HIGH (ref 82–167)
Complement C4, Serum: 45 mg/dL — ABNORMAL HIGH (ref 12–38)

## 2021-06-08 LAB — TRYPTASE: Tryptase: 10.3 ug/L (ref 2.2–13.2)

## 2021-06-08 LAB — SEDIMENTATION RATE: Sed Rate: 6 mm/hr (ref 0–40)

## 2021-06-08 LAB — CHRONIC URTICARIA: cu index: 3.1 (ref <10)

## 2021-06-13 ENCOUNTER — Ambulatory Visit (INDEPENDENT_AMBULATORY_CARE_PROVIDER_SITE_OTHER): Payer: Medicare Other | Admitting: Family Medicine

## 2021-06-13 ENCOUNTER — Encounter: Payer: Self-pay | Admitting: Family Medicine

## 2021-06-13 VITALS — BP 124/80 | HR 61 | Temp 97.3°F | Resp 16 | Wt 201.4 lb

## 2021-06-13 DIAGNOSIS — I10 Essential (primary) hypertension: Secondary | ICD-10-CM | POA: Diagnosis not present

## 2021-06-13 DIAGNOSIS — E1169 Type 2 diabetes mellitus with other specified complication: Secondary | ICD-10-CM

## 2021-06-13 DIAGNOSIS — G620 Drug-induced polyneuropathy: Secondary | ICD-10-CM | POA: Diagnosis not present

## 2021-06-13 DIAGNOSIS — E785 Hyperlipidemia, unspecified: Secondary | ICD-10-CM

## 2021-06-13 DIAGNOSIS — T451X5A Adverse effect of antineoplastic and immunosuppressive drugs, initial encounter: Secondary | ICD-10-CM | POA: Diagnosis not present

## 2021-06-13 DIAGNOSIS — E119 Type 2 diabetes mellitus without complications: Secondary | ICD-10-CM | POA: Diagnosis not present

## 2021-06-13 DIAGNOSIS — M797 Fibromyalgia: Secondary | ICD-10-CM | POA: Diagnosis not present

## 2021-06-13 DIAGNOSIS — U071 COVID-19: Secondary | ICD-10-CM | POA: Diagnosis not present

## 2021-06-13 LAB — BASIC METABOLIC PANEL
BUN: 16 mg/dL (ref 6–23)
CO2: 31 mEq/L (ref 19–32)
Calcium: 9.4 mg/dL (ref 8.4–10.5)
Chloride: 102 mEq/L (ref 96–112)
Creatinine, Ser: 0.83 mg/dL (ref 0.40–1.20)
GFR: 68.98 mL/min (ref >60.00)
Glucose, Bld: 67 mg/dL — ABNORMAL LOW (ref 70–99)
Potassium: 3.8 mEq/L (ref 3.5–5.1)
Sodium: 140 mEq/L (ref 135–145)

## 2021-06-13 LAB — LIPID PANEL
Cholesterol: 136 mg/dL (ref 0–200)
HDL: 61.9 mg/dL (ref >39.00)
LDL Cholesterol: 52 mg/dL (ref 0–99)
NonHDL: 73.95
Total CHOL/HDL Ratio: 2
Triglycerides: 108 mg/dL (ref 0.0–149.0)
VLDL: 21.6 mg/dL (ref 0.0–40.0)

## 2021-06-13 LAB — HEPATIC FUNCTION PANEL
ALT: 11 U/L (ref 0–35)
AST: 13 U/L (ref 0–37)
Albumin: 4.3 g/dL (ref 3.5–5.2)
Alkaline Phosphatase: 124 U/L — ABNORMAL HIGH (ref 39–117)
Bilirubin, Direct: 0.1 mg/dL (ref 0.0–0.3)
Total Bilirubin: 0.4 mg/dL (ref 0.2–1.2)
Total Protein: 6.6 g/dL (ref 6.0–8.3)

## 2021-06-13 LAB — CBC WITH DIFFERENTIAL/PLATELET
Basophils Absolute: 0.1 10*3/uL (ref 0.0–0.1)
Basophils Relative: 0.5 % (ref 0.0–3.0)
Eosinophils Absolute: 0.1 10*3/uL (ref 0.0–0.7)
Eosinophils Relative: 0.7 % (ref 0.0–5.0)
HCT: 42.4 % (ref 36.0–46.0)
Hemoglobin: 13.8 g/dL (ref 12.0–15.0)
Lymphocytes Relative: 26.3 % (ref 12.0–46.0)
Lymphs Abs: 3 10*3/uL (ref 0.7–4.0)
MCHC: 32.5 g/dL (ref 30.0–36.0)
MCV: 92.3 fl (ref 78.0–100.0)
Monocytes Absolute: 1 10*3/uL (ref 0.1–1.0)
Monocytes Relative: 8.6 % (ref 3.0–12.0)
Neutro Abs: 7.2 10*3/uL (ref 1.4–7.7)
Neutrophils Relative %: 63.9 % (ref 43.0–77.0)
Platelets: 323 10*3/uL (ref 150.0–400.0)
RBC: 4.6 Mil/uL (ref 3.87–5.11)
RDW: 14.6 % (ref 11.5–15.5)
WBC: 11.3 10*3/uL — ABNORMAL HIGH (ref 4.0–10.5)

## 2021-06-13 LAB — TSH: TSH: 2.02 u[IU]/mL (ref 0.35–5.50)

## 2021-06-13 LAB — HEMOGLOBIN A1C: Hgb A1c MFr Bld: 6.8 % — ABNORMAL HIGH (ref 4.6–6.5)

## 2021-06-13 MED ORDER — GABAPENTIN 300 MG PO CAPS: ORAL_CAPSULE | ORAL | 3 refills | Status: DC

## 2021-06-13 NOTE — Assessment & Plan Note (Signed)
Chronic problem.  Tolerating Lipitor w/o difficulty.  Check labs.  Adjust meds prn  

## 2021-06-13 NOTE — Assessment & Plan Note (Signed)
Ongoing issue.  New prescription sent to reflect increased nightly dose.  Handicap placard form completed. ?

## 2021-06-13 NOTE — Assessment & Plan Note (Signed)
Chronic problem.  Well controlled today on Amlodipine and Metoprolol.  Asymptomatic.  No med changes at this time. ?

## 2021-06-13 NOTE — Patient Instructions (Signed)
Follow up in 3-4 months to recheck diabetes ?We'll notify you of your lab results and make any changes if needed ?Continue to work on healthy, low carb diet ?RESTART the Gabapentin ?Call with any questions or concerns ?Stay Safe!  Stay Healthy! ?Safe Travels!!! ?

## 2021-06-13 NOTE — Assessment & Plan Note (Signed)
Pt's BMI 34.57 and coupled w/ her other medical issues, this qualifies as morbidly obese.  Stressed need for low carb diet.  Will follow. ?

## 2021-06-13 NOTE — Assessment & Plan Note (Addendum)
Chronic problem.  Attempting to control w/ diet.  UTD on foot exam, eye exam.  Check labs.  Determine if medication is needed. ?

## 2021-06-13 NOTE — Progress Notes (Signed)
? ?  Subjective:  ? ? Patient ID: Tracey Evans, female    DOB: 12/14/1945, 76 y.o.   MRN: [id number] ? ?HPI ?HTN- chronic problem, on Metoprolol 12.'5mg'$  BID, Amlodipine 2.'5mg'$  daily w/ good control.  No CP, SOB, HAs, visual changes, edema. ? ?Hyperlipidemia- chronic problem, on Lipitor '20mg'$  daily.  No abd pain, N/V. ? ?DM- chronic problem.  Currently attempting to control w/ diet.  UTD on eye exam, foot exam.  Known neuropathy.  Denies symptomatic lows. ? ?Morbid obesity- pt has gained 5 lbs since last visit due to continued use of Prednisone.  BMI now 34.57. ? ?Neuropathy- pt requesting Handicap Placard.  This is pt's 'biggest concern'.  Pt ran out of gabapentin 1-2 weeks ago.  She reports she was doing well when taking 1 in the morning, 1 in the afternoon, and 2 at night. ? ? ?Review of Systems ?For ROS see HPI  ? ?This visit occurred during the SARS-CoV-2 public health emergency.  Safety protocols were in place, including screening questions prior to the visit, additional usage of staff PPE, and extensive cleaning of exam room while observing appropriate contact time as indicated for disinfecting solutions.   ?   ?Objective:  ? Physical Exam ?Vitals reviewed.  ?Constitutional:   ?   General: She is not in acute distress. ?   Appearance: Normal appearance. She is well-developed. She is not ill-appearing.  ?HENT:  ?   Head: Normocephalic and atraumatic.  ?Eyes:  ?   Conjunctiva/sclera: Conjunctivae normal.  ?   Pupils: Pupils are equal, round, and reactive to light.  ?Neck:  ?   Thyroid: No thyromegaly.  ?Cardiovascular:  ?   Rate and Rhythm: Normal rate and regular rhythm.  ?   Pulses: Normal pulses.  ?   Heart sounds: Normal heart sounds. No murmur heard. ?Pulmonary:  ?   Effort: Pulmonary effort is normal. No respiratory distress.  ?   Breath sounds: Normal breath sounds.  ?Abdominal:  ?   General: There is no distension.  ?   Palpations: Abdomen is soft.  ?   Tenderness: There is no abdominal tenderness.   ?Musculoskeletal:  ?   Cervical back: Normal range of motion and neck supple.  ?   Right lower leg: No edema.  ?   Left lower leg: No edema.  ?Lymphadenopathy:  ?   Cervical: No cervical adenopathy.  ?Skin: ?   General: Skin is warm and dry.  ?Neurological:  ?   General: No focal deficit present.  ?   Mental Status: She is alert and oriented to person, place, and time.  ?Psychiatric:     ?   Mood and Affect: Mood normal.     ?   Behavior: Behavior normal.  ? ? ? ? ? ?   ?Assessment & Plan:  ? ? ?

## 2021-06-16 DIAGNOSIS — I10 Essential (primary) hypertension: Secondary | ICD-10-CM | POA: Diagnosis not present

## 2021-06-16 DIAGNOSIS — I2510 Atherosclerotic heart disease of native coronary artery without angina pectoris: Secondary | ICD-10-CM | POA: Diagnosis not present

## 2021-06-16 DIAGNOSIS — E039 Hypothyroidism, unspecified: Secondary | ICD-10-CM | POA: Diagnosis not present

## 2021-06-16 DIAGNOSIS — E785 Hyperlipidemia, unspecified: Secondary | ICD-10-CM | POA: Diagnosis not present

## 2021-06-24 DIAGNOSIS — Z20822 Contact with and (suspected) exposure to covid-19: Secondary | ICD-10-CM | POA: Diagnosis not present

## 2021-06-27 DIAGNOSIS — Z20822 Contact with and (suspected) exposure to covid-19: Secondary | ICD-10-CM | POA: Diagnosis not present

## 2021-06-30 DIAGNOSIS — Z20822 Contact with and (suspected) exposure to covid-19: Secondary | ICD-10-CM | POA: Diagnosis not present

## 2021-07-15 ENCOUNTER — Encounter: Payer: Self-pay | Admitting: Family Medicine

## 2021-07-17 ENCOUNTER — Other Ambulatory Visit: Payer: Self-pay

## 2021-07-17 DIAGNOSIS — I10 Essential (primary) hypertension: Secondary | ICD-10-CM

## 2021-07-17 MED ORDER — METOPROLOL TARTRATE 25 MG PO TABS: ORAL_TABLET | ORAL | 0 refills | Status: DC

## 2021-07-23 ENCOUNTER — Other Ambulatory Visit: Payer: Self-pay | Admitting: Family Medicine

## 2021-07-23 DIAGNOSIS — I10 Essential (primary) hypertension: Secondary | ICD-10-CM

## 2021-07-24 DIAGNOSIS — Z20822 Contact with and (suspected) exposure to covid-19: Secondary | ICD-10-CM | POA: Diagnosis not present

## 2021-08-03 DIAGNOSIS — Z20822 Contact with and (suspected) exposure to covid-19: Secondary | ICD-10-CM | POA: Diagnosis not present

## 2021-08-08 DIAGNOSIS — M5451 Vertebrogenic low back pain: Secondary | ICD-10-CM | POA: Diagnosis not present

## 2021-08-09 ENCOUNTER — Other Ambulatory Visit: Payer: Self-pay | Admitting: Family Medicine

## 2021-08-09 DIAGNOSIS — I10 Essential (primary) hypertension: Secondary | ICD-10-CM

## 2021-08-10 ENCOUNTER — Telehealth: Payer: Self-pay | Admitting: Family Medicine

## 2021-08-10 ENCOUNTER — Other Ambulatory Visit: Payer: Self-pay

## 2021-08-10 MED ORDER — AMLODIPINE BESYLATE 2.5 MG PO TABS: 2.5000 mg | ORAL_TABLET | Freq: Every day | ORAL | 1 refills | Status: DC

## 2021-08-10 NOTE — Telephone Encounter (Signed)
Amlodipine 2.'5mg'$  daily is on her med list.  Can refill #90, 1 refill ?

## 2021-08-10 NOTE — Telephone Encounter (Signed)
Caller Phone Number [phone] ?Patient Name Tracey Evans ?Patient DOB 07/16/1945 ?Call Type Message Only Information Provided ?Reason for Call Request for General Office Information ?Initial Comment Caller states that her refill was denied. Amlodipine. She has been out for 2 months ?Additional Comment Wants to know if she is supposed to be taking it ?Disp. Time Disposition Final User ?08/09/2021 5:10:38 PM General Information Provided Yes Juline Patch ?Call Closed By: Juline Patch ?Transaction Date/Time: 08/09/2021 5:06:03 PM (ET ?

## 2021-08-12 DIAGNOSIS — Z20822 Contact with and (suspected) exposure to covid-19: Secondary | ICD-10-CM | POA: Diagnosis not present

## 2021-08-14 DIAGNOSIS — Z20822 Contact with and (suspected) exposure to covid-19: Secondary | ICD-10-CM | POA: Diagnosis not present

## 2021-08-15 ENCOUNTER — Other Ambulatory Visit: Payer: Self-pay | Admitting: Family Medicine

## 2021-08-15 DIAGNOSIS — K219 Gastro-esophageal reflux disease without esophagitis: Secondary | ICD-10-CM

## 2021-08-17 ENCOUNTER — Ambulatory Visit: Payer: Medicare Other | Admitting: Allergy

## 2021-08-23 ENCOUNTER — Telehealth: Payer: Self-pay

## 2021-08-23 NOTE — Telephone Encounter (Signed)
Spoke with patient regarding stool sample & she said she thought we had changed our mind since she didn't receive stool kit in office, not realizing she had to go to lab to pick it up. She said the diarrhea is resolving & would follow up as needed. ?

## 2021-08-29 DIAGNOSIS — M545 Low back pain, unspecified: Secondary | ICD-10-CM | POA: Diagnosis not present

## 2021-08-29 DIAGNOSIS — M5136 Other intervertebral disc degeneration, lumbar region: Secondary | ICD-10-CM | POA: Diagnosis not present

## 2021-09-06 ENCOUNTER — Other Ambulatory Visit: Payer: Self-pay | Admitting: Family Medicine

## 2021-09-06 DIAGNOSIS — K219 Gastro-esophageal reflux disease without esophagitis: Secondary | ICD-10-CM

## 2021-09-07 ENCOUNTER — Other Ambulatory Visit: Payer: Self-pay

## 2021-09-07 ENCOUNTER — Telehealth: Payer: Self-pay | Admitting: Family Medicine

## 2021-09-07 DIAGNOSIS — I10 Essential (primary) hypertension: Secondary | ICD-10-CM

## 2021-09-07 DIAGNOSIS — K219 Gastro-esophageal reflux disease without esophagitis: Secondary | ICD-10-CM

## 2021-09-07 MED ORDER — OMEPRAZOLE 20 MG PO CPDR: 20.0000 mg | DELAYED_RELEASE_CAPSULE | Freq: Every day | ORAL | 0 refills | Status: DC

## 2021-09-07 MED ORDER — METOPROLOL TARTRATE 25 MG PO TABS: ORAL_TABLET | ORAL | 0 refills | Status: DC

## 2021-09-07 NOTE — Telephone Encounter (Signed)
Pt called in asking for refills on the omeprazole and the metoprolol tartrate can be sent into the [facility] on [address]

## 2021-09-07 NOTE — Telephone Encounter (Signed)
Medication has been sent to the pharmacy. 

## 2021-09-11 ENCOUNTER — Telehealth: Payer: Self-pay | Admitting: Family Medicine

## 2021-09-11 NOTE — Telephone Encounter (Signed)
She should be fine without the medication until it is time for a refill

## 2021-09-11 NOTE — Telephone Encounter (Signed)
Called and made patient aware she will call if she has any questions or concerns.

## 2021-09-11 NOTE — Telephone Encounter (Signed)
PT stats that she suppose to take 1/2 tab of metoprolol 25 mg. PT stats she been taking the whole tab, thinking they are 1/2 tab. PT stats insurance will not pay for another refill. PT says  she feels fine  but will she be okay without medication for month.

## 2021-09-12 ENCOUNTER — Telehealth: Payer: Self-pay | Admitting: Family Medicine

## 2021-09-12 DIAGNOSIS — F4542 Pain disorder with related psychological factors: Secondary | ICD-10-CM | POA: Diagnosis not present

## 2021-09-12 NOTE — Telephone Encounter (Signed)
Left message for patient to call back and schedule Medicare Annual Wellness Visit (AWV).   Please offer to do virtually or by telephone.  Left office number and my jabber [phone].  Last AWV:08/16/2016  Please schedule at anytime with Nurse Health Advisor.

## 2021-09-14 ENCOUNTER — Ambulatory Visit (INDEPENDENT_AMBULATORY_CARE_PROVIDER_SITE_OTHER): Payer: Medicare Other

## 2021-09-14 DIAGNOSIS — Z780 Asymptomatic menopausal state: Secondary | ICD-10-CM | POA: Diagnosis not present

## 2021-09-14 DIAGNOSIS — Z0000 Encounter for general adult medical examination without abnormal findings: Secondary | ICD-10-CM

## 2021-09-14 NOTE — Progress Notes (Signed)
Subjective:   Tracey Evans is a 76 y.o. female who presents for Medicare Annual (Subsequent) preventive examination.   I connected with Blane Ohara today by telephone and verified that I am speaking with the correct person using two identifiers. Location patient: home Location provider: work Persons participating in the virtual visit: patient, provider.   I discussed the limitations, risks, security and privacy concerns of performing an evaluation and management service by telephone and the availability of in person appointments. I also discussed with the patient that there may be a patient responsible charge related to this service. The patient expressed understanding and verbally consented to this telephonic visit.    Interactive audio and video telecommunications were attempted between this provider and patient, however failed, due to patient having technical difficulties OR patient did not have access to video capability.  We continued and completed visit with audio only.    Review of Systems     Cardiac Risk Factors include: advanced age (>29mn, >>39women)     Objective:    Today's Vitals   There is no height or weight on file to calculate BMI.     09/14/2021    9:39 AM 10/05/2018    6:06 PM 10/05/2018   11:36 AM 06/08/2018    1:04 PM 04/28/2017    4:35 PM 03/27/2017    2:45 PM 02/22/2017    9:26 AM  Advanced Directives  Does Patient Have a Medical Advance Directive? Yes Yes Yes Yes No Yes No  Type of AParamed[place]of [place] will Living will [place] Station[place] will Healthcare Power of [place] will   Does patient want to make changes to medical advance directive?  Yes (Inpatient - patient defers changing a medical advance directive at this time - Information given)    No - Patient declined   Copy of HHavrein Chart? No - copy requested     Yes   Would patient  like information on creating a medical advance directive?       No - Patient declined    Current Medications (verified) Outpatient Encounter Medications as of 09/14/2021  Medication Sig   albuterol (VENTOLIN HFA) 108 (90 Base) MCG/ACT inhaler Inhale 2 puffs into the lungs every 6 (six) hours as needed for wheezing or shortness of breath.   amLODipine (NORVASC) 2.5 MG tablet Take 1 tablet (2.5 mg total) by mouth daily.   aspirin 81 MG tablet Take 81 mg by mouth daily.   atorvastatin (LIPITOR) 20 MG tablet Take 20 mg by mouth daily.   diclofenac Sodium (VOLTAREN) 1 % GEL Place onto the skin.   gabapentin (NEURONTIN) 300 MG capsule Take 1 tab in the morning, 1 in the afternoon, and 2 tabs before bed   Glycerin-Polysorbate 80 (REFRESH DRY EYE THERAPY OP) Place 1 drop into both eyes 3 (three) times daily.   hydrOXYzine (ATARAX) 10 MG tablet Take 1 tablet (10 mg total) by mouth 3 (three) times daily as needed for itching.   levothyroxine (SYNTHROID) 75 MCG tablet TAKE 1 TABLET BY MOUTH DAILY BEFORE BREAKFAST.   magnesium oxide (MAG-OX) 400 (240 Mg) MG tablet Take 1 tablet (400 mg total) by mouth daily. (Patient taking differently: Take 400 mg by mouth 2 (two) times daily.)   metoprolol tartrate (LOPRESSOR) 25 MG tablet TAKE 1/2 TABLET BY MOUTH TWICE A DAY   omeprazole (PRILOSEC) 20 MG capsule Take 1 capsule (20 mg total) by  mouth daily.   triamcinolone ointment (KENALOG) 0.1 % Apply 1 application topically 2 (two) times daily.   Wheat Dextrin (BENEFIBER) POWD Use as directed, daily   tiZANidine (ZANAFLEX) 2 MG tablet  (Patient not taking: Reported on 09/14/2021)   No facility-administered encounter medications on file as of 09/14/2021.    Allergies (verified) Contrast media [iodinated contrast media], Dilaudid [hydromorphone hcl], Adhesive [tape], Codeine, Cymbalta [duloxetine hcl], Lactose intolerance (gi), Latex, Nsaids, and Lyrica [pregabalin]   History: Past Medical History:  Diagnosis Date    Angina at rest [facility])    "occurs whenever it wants to, but worse during agitation"   Anxiety    Asthma    Binge eating disorder    Breast cancer of lower-outer quadrant of left female breast ([place]) 07/08/2015   "NEVER HAD LEFT BREAST CANCER" (08/15/2015)   Cancer of right breast ([place]) 08/15/2015   Chronic bronchitis ([place])    Chronic lower back pain    Complication of anesthesia    had bronc spasms during intubation surgery 2009 on foot-need albuterol inhaler or neb tx preop   Costochondritis    Depression    Diverticular disease    11-2020 bleed   Fibromyalgia    GERD (gastroesophageal reflux disease)    Hot flashes    Hyperlipidemia    Hypertension    Hypothyroidism    Osteoarthritis    "all over"   Peptic ulcer disease    Personal history of chemotherapy 2017   Shortness of breath dyspnea    Type II diabetes mellitus ([place])    "diet controlled" (08/15/2015)   Past Surgical History:  Procedure Laterality Date   ABDOMINAL HYSTERECTOMY     "left my ovaries"   ANKLE ARTHROSCOPY  01/02/2012   Procedure: ANKLE ARTHROSCOPY;  Surgeon: Colin Rhein, MD;  Location: [place];  Service: Orthopedics;  Laterality: Right;  right ankle arthroscopy with extensive debridement , dridement and drilling talar dome osteochondral lesion   ANKLE FRACTURE SURGERY Right 2009   Dr. Rolena Infante   BREAST BIOPSY  07/2015   CARDIAC CATHETERIZATION     CARPAL TUNNEL RELEASE Bilateral    CATARACT EXTRACTION, BILATERAL     COLONOSCOPY     DILATION AND CURETTAGE OF UTERUS     "before hysterectomy"   FRACTURE SURGERY     KNEE ARTHROSCOPY Bilateral    LAPAROSCOPIC CHOLECYSTECTOMY     MASTECTOMY COMPLETE / SIMPLE W/ SENTINEL NODE BIOPSY Right 08/15/2015   MASTECTOMY W/ SENTINEL NODE BIOPSY Right 08/15/2015   Procedure: TOTAL MASTECTOMY WITH SENTINEL LYMPH NODE BIOPSY AND BLUE DYE INJECTION ;  Surgeon: Fanny Skates, MD;  Location: [place];  Service: General;  Laterality: Right;   PORT-A-CATH REMOVAL  N/A 11/12/2016   Procedure: REMOVAL PORT-A-CATH;  Surgeon: Jackolyn Confer, MD;  Location: [place];  Service: General;  Laterality: N/A;   PORTACATH PLACEMENT Left 08/15/2015   PORTACATH PLACEMENT Left 08/15/2015   Procedure: INSERTION PORT-A-CATH ;  Surgeon: Fanny Skates, MD;  Location: [place];  Service: General;  Laterality: Left;   SHOULDER ARTHROSCOPY Bilateral 2009-2011   "bone spurs"   TONSILLECTOMY     Family History  Problem Relation Age of Onset   Colon polyps Mother    Dementia Mother    Stroke Mother    Heart Problems Mother    Dementia Father    Alcohol abuse Father    Colon cancer Father        dx. 58 or younger-pt states never confirmed  Breast cancer Sister 48       inflammatory   Diverticulitis Sister        maternal half-sister; severe - causing partial colectomy   Breast cancer Sister        maternal half-sister; dx. early 11s   Alcohol abuse Brother    Colon polyps Brother    Breast cancer Other 29       niece   Breast cancer Other        niece dx. 61s   Esophageal cancer Neg Hx    Stomach cancer Neg Hx    Rectal cancer Neg Hx    Social History   Socioeconomic History   Marital status: Single    Spouse name: Not on file   Number of children: 3   Years of education: Not on file   Highest education level: Not on file  Occupational History   Occupation: retired  Tobacco Use   Smoking status: Never   Smokeless tobacco: Never  Vaping Use   Vaping Use: Never used  Substance and Sexual Activity   Alcohol use: Not Currently    Alcohol/week: 0.0 standard drinks of alcohol    Comment: 08/15/2015 "1/2 glass of wine q 2 months"    Drug use: No   Sexual activity: Not Currently    Comment: 1st intercourse- 14, partners- 64, divorced  Other Topics Concern   Not on file  Social History Narrative   Not on file   Social Determinants of Health   Financial Resource Strain: Low Risk  (09/14/2021)   Overall Financial Resource Strain (CARDIA)     Difficulty of Paying Living Expenses: Not hard at all  Food Insecurity: No Food Insecurity (09/14/2021)   Hunger Vital Sign    Worried About Running Out of Food in the Last Year: Never true    [place] in the Last Year: Never true  Transportation Needs: No Transportation Needs (09/14/2021)   PRAPARE - Hydrologist (Medical): No    Lack of Transportation (Non-Medical): No  Physical Activity: Inactive (09/14/2021)   Exercise Vital Sign    Days of Exercise per Week: 0 days    Minutes of Exercise per Session: 0 min  Stress: No Stress Concern Present (09/14/2021)   [place]    Feeling of Stress : Not at all  Social Connections: Moderately Integrated (09/14/2021)   Social Connection and Isolation Panel [NHANES]    Frequency of Communication with Friends and Family: Three times a week    Frequency of Social Gatherings with Friends and Family: Three times a week    Attends Religious Services: More than 4 times per year    Active Member of Clubs or Organizations: Yes    Attends Archivist Meetings: 1 to 4 times per year    Marital Status: Divorced    Tobacco Counseling Counseling given: Not Answered   Clinical Intake:  Pre-visit preparation completed: Yes  Pain : No/denies pain     Nutritional Risks: None Diabetes: No  How often do you need to have someone help you when you read instructions, pamphlets, or other written materials from your doctor or pharmacy?: 1 - Never What is the last grade level you completed in school?: college  Diabetic?no   Interpreter Needed?: No  Information entered by :: [place] of Daily Living    09/14/2021    9:40 AM  In your present  state of health, do you have any difficulty performing the following activities:  Hearing? 0  Vision? 0  Difficulty concentrating or making decisions? 0  Walking or climbing stairs? 0   Dressing or bathing? 0  Doing errands, shopping? 0  Preparing Food and eating ? N  Using the Toilet? N  In the past six months, have you accidently leaked urine? N  Do you have problems with loss of bowel control? N  Managing your Medications? N  Managing your Finances? N  Housekeeping or managing your Housekeeping? N    Patient Care Team: Midge Minium, MD as PCP - General (Family Medicine) [name], MD as Consulting Physician (Cardiology) Marylynn Pearson, MD as Consulting Physician (Ophthalmology) Nicholas Lose, MD as Consulting Physician (Hematology and Oncology) Kyung Rudd, MD as Consulting Physician (Radiation Oncology) Fanny Skates, MD as Consulting Physician (General Surgery) Ortho, [place] (Specialist) Suella Broad, MD as Consulting Physician (Physical Medicine and Rehabilitation) Delice Bison, Charlestine Massed, NP as Nurse Practitioner (Hematology and Oncology)  Indicate any recent Medical Services you may have received from other than [facility] providers in the past year (date may be approximate).     Assessment:   This is a routine wellness examination for Tracey Evans.  Hearing/Vision screen Vision Screening - Comments:: Annual eye exams wear glasses   Dietary issues and exercise activities discussed: Current Exercise Habits: The patient does not participate in regular exercise at present, Exercise limited by: orthopedic condition(s)   Goals Addressed               This Visit's Progress     patient states (pt-stated)   On track     See a therapist that specializes in cancer/neuropathy. Will discuss with nurse navigator.        Depression Screen    09/14/2021    9:40 AM 09/14/2021    9:38 AM 06/13/2021    8:13 AM 05/09/2021   11:29 AM 02/27/2021   11:04 AM 01/25/2021    9:24 AM 12/11/2018    9:24 AM  PHQ 2/9 Scores  PHQ - 2 Score 0 0 0 0 '5 5 6  '$ PHQ- 9 Score   '5 4 14 14 14    '$ Fall Risk    09/14/2021    9:41 AM 06/13/2021    8:13 AM 05/09/2021   11:29 AM  02/27/2021   11:04 AM 01/25/2021    9:23 AM  Fall Risk   Falls in the past year? 0 0 0 0 0  Number falls in past yr: 0   0 0  Injury with Fall? 0   0 0  Risk for fall due to :  No Fall Risks No Fall Risks No Fall Risks No Fall Risks  Follow up Falls evaluation completed Falls evaluation completed Falls evaluation completed Falls evaluation completed Falls evaluation completed  Comment uses cane /walker        FALL RISK PREVENTION PERTAINING TO THE HOME:  Any stairs in or around the home? No  If so, are there any without handrails? No  Home free of loose throw rugs in walkways, pet beds, electrical cords, etc? Yes  Adequate lighting in your home to reduce risk of falls? Yes   ASSISTIVE DEVICES UTILIZED TO PREVENT FALLS:  Life alert? No  Use of a cane, walker or w/c? No  Grab bars in the bathroom? No  Shower chair or bench in shower? No  Elevated toilet seat or a handicapped toilet? No   Cognitive Function:  Normal  cognitive status assessed by telephone conversation by this Nurse Health Advisor. No abnormalities found.        Immunizations Immunization History  Administered Date(s) Administered   Fluad Quad(high Dose 65+) 12/19/2018   Influenza Split 01/23/2012   Influenza, High Dose Seasonal PF 01/06/2014   Influenza,inj,Quad PF,6+ Mos 01/16/2013, 12/09/2014, 02/09/2016, 03/22/2017   Influenza,inj,quad, With Preservative 01/07/2018   Influenza-Unspecified 12/21/2019   PFIZER(Purple Top)SARS-COV-2 Vaccination 07/02/2019, 07/23/2019   Pfizer Covid-19 Vaccine Bivalent Booster 76yr & up 01/20/2021   Pneumococcal Conjugate-13 04/21/2015   Pneumococcal Polysaccharide-23 10/22/2011   Tdap 01/16/2013, 10/26/2019   Zoster Recombinat (Shingrix) 01/20/2021, 04/05/2021    TDAP status: Up to date  Flu Vaccine status: Up to date  Pneumococcal vaccine status: Up to date  Covid-19 vaccine status: Completed vaccines  Qualifies for Shingles Vaccine? Yes   Zostavax completed  Yes   Shingrix Completed?: Yes  Screening Tests Health Maintenance  Topic Date Due   COVID-19 Vaccine (4 - Booster for Pfizer series) 03/17/2021   INFLUENZA VACCINE  11/07/2021   HEMOGLOBIN A1C  12/14/2021   OPHTHALMOLOGY EXAM  01/24/2022   FOOT EXAM  01/25/2022   TETANUS/TDAP  10/25/2029   Pneumonia Vaccine 76 Years old  Completed   DEXA SCAN  Completed   Hepatitis C Screening  Completed   Zoster Vaccines- Shingrix  Completed   HPV VACCINES  Aged Out   COLONOSCOPY (Pts 45-438yrInsurance coverage will need to be confirmed)  Discontinued    Health Maintenance  Health Maintenance Due  Topic Date Due   COVID-19 Vaccine (4 - Booster for PfFerguseries) 03/17/2021    Colorectal cancer screening: No longer required.   Mammogram status: No longer required due to age.  Bone Density status: Ordered 09/14/2021. Pt provided with contact info and advised to call to schedule appt.  Lung Cancer Screening: (Low Dose CT Chest recommended if Age 76-80ears, 30 pack-year currently smoking OR have quit w/in 15years.) does not qualify.   Lung Cancer Screening Referral: n/a  Additional Screening:  Hepatitis C Screening: does not qualify;   Vision Screening: Recommended annual ophthalmology exams for early detection of glaucoma and other disorders of the eye. Is the patient up to date with their annual eye exam?  Yes  Who is the provider or what is the name of the office in which the patient attends annual eye exams? Dr. WhLynnae PrudeIf pt is not established with a provider, would they like to be referred to a provider to establish care? No .   Dental Screening: Recommended annual dental exams for proper oral hygiene  Community Resource Referral / Chronic Care Management: CRR required this visit?  No   CCM required this visit?  No      Plan:     I have personally reviewed and noted the following in the patient's chart:   Medical and social history Use of alcohol, tobacco or  illicit drugs  Current medications and supplements including opioid prescriptions.  Functional ability and status Nutritional status Physical activity Advanced directives List of other physicians Hospitalizations, surgeries, and ER visits in previous 12 months Vitals Screenings to include cognitive, depression, and falls Referrals and appointments  In addition, I have reviewed and discussed with patient certain preventive protocols, quality metrics, and best practice recommendations. A written personalized care plan for preventive services as well as general preventive health recommendations were provided to patient.     LaRandel PiggLPN   6/04/13/7827 Nurse Notes: none

## 2021-09-14 NOTE — Patient Instructions (Signed)
Tracey Evans , Thank you for taking time to come for your Medicare Wellness Visit. I appreciate your ongoing commitment to your health goals. Please review the following plan we discussed and let me know if I can assist you in the future.   Screening recommendations/referrals: Colonoscopy: no longer required  Mammogram: no longer required  Bone Density: referral 09/14/2021 Recommended yearly ophthalmology/optometry visit for glaucoma screening and checkup Recommended yearly dental visit for hygiene and checkup  Vaccinations: Influenza vaccine: completed  Pneumococcal vaccine: completed  Tdap vaccine: 10/26/2019 Shingles vaccine: completed     Advanced directives: yes  Conditions/risks identified: none   Next appointment: none    Preventive Care 31 Years and Older, Female Preventive care refers to lifestyle choices and visits with your health care provider that can promote health and wellness. What does preventive care include? A yearly physical exam. This is also called an annual well check. Dental exams once or twice a year. Routine eye exams. Ask your health care provider how often you should have your eyes checked. Personal lifestyle choices, including: Daily care of your teeth and gums. Regular physical activity. Eating a healthy diet. Avoiding tobacco and drug use. Limiting alcohol use. Practicing safe sex. Taking low-dose aspirin every day. Taking vitamin and mineral supplements as recommended by your health care provider. What happens during an annual well check? The services and screenings done by your health care provider during your annual well check will depend on your age, overall health, lifestyle risk factors, and family history of disease. Counseling  Your health care provider may ask you questions about your: Alcohol use. Tobacco use. Drug use. Emotional well-being. Home and relationship well-being. Sexual activity. Eating habits. History of  falls. Memory and ability to understand (cognition). Work and work Statistician. Reproductive health. Screening  You may have the following tests or measurements: Height, weight, and BMI. Blood pressure. Lipid and cholesterol levels. These may be checked every 5 years, or more frequently if you are over 28 years old. Skin check. Lung cancer screening. You may have this screening every year starting at age 22 if you have a 30-pack-year history of smoking and currently smoke or have quit within the past 15 years. Fecal occult blood test (FOBT) of the stool. You may have this test every year starting at age 42. Flexible sigmoidoscopy or colonoscopy. You may have a sigmoidoscopy every 5 years or a colonoscopy every 10 years starting at age 68. Hepatitis C blood test. Hepatitis B blood test. Sexually transmitted disease (STD) testing. Diabetes screening. This is done by checking your blood sugar (glucose) after you have not eaten for a while (fasting). You may have this done every 1-3 years. Bone density scan. This is done to screen for osteoporosis. You may have this done starting at age 43. Mammogram. This may be done every 1-2 years. Talk to your health care provider about how often you should have regular mammograms. Talk with your health care provider about your test results, treatment options, and if necessary, the need for more tests. Vaccines  Your health care provider may recommend certain vaccines, such as: Influenza vaccine. This is recommended every year. Tetanus, diphtheria, and acellular pertussis (Tdap, Td) vaccine. You may need a Td booster every 10 years. Zoster vaccine. You may need this after age 7. Pneumococcal 13-valent conjugate (PCV13) vaccine. One dose is recommended after age 28. Pneumococcal polysaccharide (PPSV23) vaccine. One dose is recommended after age 40. Talk to your health care provider about which screenings and vaccines  you need and how often you need  them. This information is not intended to replace advice given to you by your health care provider. Make sure you discuss any questions you have with your health care provider. Document Released: 04/22/2015 Document Revised: 12/14/2015 Document Reviewed: 01/25/2015 Elsevier Interactive Patient Education  2017 [place] Prevention in the Home Falls can cause injuries. They can happen to people of all ages. There are many things you can do to make your home safe and to help prevent falls. What can I do on the outside of my home? Regularly fix the edges of walkways and driveways and fix any cracks. Remove anything that might make you trip as you walk through a door, such as a raised step or threshold. Trim any bushes or trees on the path to your home. Use bright outdoor lighting. Clear any walking paths of anything that might make someone trip, such as rocks or tools. Regularly check to see if handrails are loose or broken. Make sure that both sides of any steps have handrails. Any raised decks and porches should have guardrails on the edges. Have any leaves, snow, or ice cleared regularly. Use sand or salt on walking paths during winter. Clean up any spills in your garage right away. This includes oil or grease spills. What can I do in the bathroom? Use night lights. Install grab bars by the toilet and in the tub and shower. Do not use towel bars as grab bars. Use non-skid mats or decals in the tub or shower. If you need to sit down in the shower, use a plastic, non-slip stool. Keep the floor dry. Clean up any water that spills on the floor as soon as it happens. Remove soap buildup in the tub or shower regularly. Attach bath mats securely with double-sided non-slip rug tape. Do not have throw rugs and other things on the floor that can make you trip. What can I do in the bedroom? Use night lights. Make sure that you have a light by your bed that is easy to reach. Do not use  any sheets or blankets that are too big for your bed. They should not hang down onto the floor. Have a firm chair that has side arms. You can use this for support while you get dressed. Do not have throw rugs and other things on the floor that can make you trip. What can I do in the kitchen? Clean up any spills right away. Avoid walking on wet floors. Keep items that you use a lot in easy-to-reach places. If you need to reach something above you, use a strong step stool that has a grab bar. Keep electrical cords out of the way. Do not use floor polish or wax that makes floors slippery. If you must use wax, use non-skid floor wax. Do not have throw rugs and other things on the floor that can make you trip. What can I do with my stairs? Do not leave any items on the stairs. Make sure that there are handrails on both sides of the stairs and use them. Fix handrails that are broken or loose. Make sure that handrails are as long as the stairways. Check any carpeting to make sure that it is firmly attached to the stairs. Fix any carpet that is loose or worn. Avoid having throw rugs at the top or bottom of the stairs. If you do have throw rugs, attach them to the floor with carpet tape. Make sure  that you have a light switch at the top of the stairs and the bottom of the stairs. If you do not have them, ask someone to add them for you. What else can I do to help prevent falls? Wear shoes that: Do not have high heels. Have rubber bottoms. Are comfortable and fit you well. Are closed at the toe. Do not wear sandals. If you use a stepladder: Make sure that it is fully opened. Do not climb a closed stepladder. Make sure that both sides of the stepladder are locked into place. Ask someone to hold it for you, if possible. Clearly mark and make sure that you can see: Any grab bars or handrails. First and last steps. Where the edge of each step is. Use tools that help you move around (mobility aids)  if they are needed. These include: Canes. Walkers. Scooters. Crutches. Turn on the lights when you go into a dark area. Replace any light bulbs as soon as they burn out. Set up your furniture so you have a clear path. Avoid moving your furniture around. If any of your floors are uneven, fix them. If there are any pets around you, be aware of where they are. Review your medicines with your doctor. Some medicines can make you feel dizzy. This can increase your chance of falling. Ask your doctor what other things that you can do to help prevent falls. This information is not intended to replace advice given to you by your health care provider. Make sure you discuss any questions you have with your health care provider. Document Released: 01/20/2009 Document Revised: 09/01/2015 Document Reviewed: 04/30/2014 Elsevier Interactive Patient Education  2017 [employer].

## 2021-09-15 DIAGNOSIS — I2510 Atherosclerotic heart disease of native coronary artery without angina pectoris: Secondary | ICD-10-CM | POA: Diagnosis not present

## 2021-09-15 DIAGNOSIS — I10 Essential (primary) hypertension: Secondary | ICD-10-CM | POA: Diagnosis not present

## 2021-09-15 DIAGNOSIS — E039 Hypothyroidism, unspecified: Secondary | ICD-10-CM | POA: Diagnosis not present

## 2021-09-15 DIAGNOSIS — E785 Hyperlipidemia, unspecified: Secondary | ICD-10-CM | POA: Diagnosis not present

## 2021-09-24 ENCOUNTER — Other Ambulatory Visit: Payer: Self-pay | Admitting: Family Medicine

## 2021-09-24 DIAGNOSIS — I10 Essential (primary) hypertension: Secondary | ICD-10-CM

## 2021-09-26 ENCOUNTER — Encounter: Payer: Self-pay | Admitting: Family Medicine

## 2021-09-26 ENCOUNTER — Ambulatory Visit (INDEPENDENT_AMBULATORY_CARE_PROVIDER_SITE_OTHER): Payer: Medicare Other | Admitting: Family Medicine

## 2021-09-26 VITALS — BP 128/78 | HR 74 | Temp 98.1°F | Resp 16 | Ht 64.0 in | Wt 196.2 lb

## 2021-09-26 DIAGNOSIS — E119 Type 2 diabetes mellitus without complications: Secondary | ICD-10-CM | POA: Diagnosis not present

## 2021-09-26 DIAGNOSIS — I10 Essential (primary) hypertension: Secondary | ICD-10-CM

## 2021-09-26 LAB — BASIC METABOLIC PANEL
BUN: 17 mg/dL (ref 6–23)
CO2: 28 mEq/L (ref 19–32)
Calcium: 10 mg/dL (ref 8.4–10.5)
Chloride: 104 mEq/L (ref 96–112)
Creatinine, Ser: 0.84 mg/dL (ref 0.40–1.20)
GFR: 67.86 mL/min (ref >60.00)
Glucose, Bld: 108 mg/dL — ABNORMAL HIGH (ref 70–99)
Potassium: 4.4 mEq/L (ref 3.5–5.1)
Sodium: 141 mEq/L (ref 135–145)

## 2021-09-26 LAB — HEMOGLOBIN A1C: Hgb A1c MFr Bld: 6.5 % (ref 4.6–6.5)

## 2021-09-26 LAB — MICROALBUMIN / CREATININE URINE RATIO
Creatinine,U: 75.9 mg/dL
Microalb Creat Ratio: 1.2 mg/g (ref 0.0–30.0)
Microalb, Ur: 0.9 mg/dL (ref 0.0–1.9)

## 2021-09-26 NOTE — Patient Instructions (Signed)
Follow up in 3-4 months to recheck sugar and cholesterol We'll notify you of your lab results and make any changes if needed Keep up the good work on low carb/low sugar diet- you're doing great! Call with any questions or concerns Hang in there!!!

## 2021-09-26 NOTE — Progress Notes (Signed)
   Subjective:    Patient ID: Tracey Evans, female    DOB: 02/05/1946, 76 y.o.   MRN: [id number]  HPI HTN- chronic problem, on Amlodipine '5mg'$  daily.  Pt has stopped Metoprolol.  No CP, SOB, HAs, visual changes, edema.  DM- chronic problem, currently diet controlled.  Last A1C 6.8%.  UTD on eye exam, foot exam.  Due for microalbumin.  Denies symptomatic lows.  + chronic neuropathy.  Obesity- pt is down 5 lbs since last visit.  BMI now 33.69  She is not able to exercise due to chronic back pain- has upcoming surgery for spinal stimulator.     Review of Systems For ROS see HPI     Objective:   Physical Exam Vitals reviewed.  Constitutional:      General: She is not in acute distress.    Appearance: Normal appearance. She is well-developed. She is not ill-appearing.  HENT:     Head: Normocephalic and atraumatic.  Eyes:     Conjunctiva/sclera: Conjunctivae normal.     Pupils: Pupils are equal, round, and reactive to light.  Neck:     Thyroid: No thyromegaly.  Cardiovascular:     Rate and Rhythm: Normal rate and regular rhythm.     Pulses: Normal pulses.     Heart sounds: Normal heart sounds. No murmur heard. Pulmonary:     Effort: Pulmonary effort is normal. No respiratory distress.     Breath sounds: Normal breath sounds.  Abdominal:     General: There is no distension.     Palpations: Abdomen is soft.     Tenderness: There is no abdominal tenderness.  Musculoskeletal:     Cervical back: Normal range of motion and neck supple.     Right lower leg: No edema.     Left lower leg: No edema.  Lymphadenopathy:     Cervical: No cervical adenopathy.  Skin:    General: Skin is warm and dry.  Neurological:     General: No focal deficit present.     Mental Status: She is alert and oriented to person, place, and time.  Psychiatric:        Mood and Affect: Mood normal.        Behavior: Behavior normal.          Assessment & Plan:

## 2021-09-27 ENCOUNTER — Telehealth: Payer: Self-pay

## 2021-09-27 NOTE — Assessment & Plan Note (Signed)
Chronic problem.  Currently well controlled on Amlodipine '5mg'$  daily.  Reports Cards stopped her Metoprolol.  Currently asymptomatic.  No med changes at this time

## 2021-09-27 NOTE — Telephone Encounter (Signed)
Informed pt of lab results  

## 2021-09-27 NOTE — Assessment & Plan Note (Signed)
Improving!  Pt is down 5 lbs and BMI is now 33.69  She is out of the morbidly obese range!  Encouraged her to continue a low carb diet and exercise as able (although she is limited due to chronic back pain).  Will continue to follow.

## 2021-09-27 NOTE — Assessment & Plan Note (Signed)
Chronic problem.  Currently diet controlled.  UTD on eye exam, foot exam.  microalbumin ordered.  Applauded her recent weight loss and encouraged low carb diet.  Check labs and determine if medication is needed.

## 2021-09-27 NOTE — Telephone Encounter (Signed)
-----   Message from Midge Minium, MD sent at 09/27/2021  7:26 AM EDT ----- Labs look great!  No changes at this time

## 2021-09-28 ENCOUNTER — Other Ambulatory Visit: Payer: Self-pay | Admitting: Family Medicine

## 2021-09-28 DIAGNOSIS — Z1231 Encounter for screening mammogram for malignant neoplasm of breast: Secondary | ICD-10-CM

## 2021-09-30 ENCOUNTER — Other Ambulatory Visit: Payer: Self-pay | Admitting: Family Medicine

## 2021-09-30 DIAGNOSIS — E039 Hypothyroidism, unspecified: Secondary | ICD-10-CM

## 2021-09-30 DIAGNOSIS — K219 Gastro-esophageal reflux disease without esophagitis: Secondary | ICD-10-CM

## 2021-11-06 ENCOUNTER — Other Ambulatory Visit: Payer: Self-pay | Admitting: Family Medicine

## 2021-11-06 DIAGNOSIS — M797 Fibromyalgia: Secondary | ICD-10-CM

## 2021-11-11 DIAGNOSIS — M25562 Pain in left knee: Secondary | ICD-10-CM | POA: Diagnosis not present

## 2021-11-11 DIAGNOSIS — M5136 Other intervertebral disc degeneration, lumbar region: Secondary | ICD-10-CM | POA: Diagnosis not present

## 2021-11-28 DIAGNOSIS — M25561 Pain in right knee: Secondary | ICD-10-CM | POA: Diagnosis not present

## 2021-12-06 ENCOUNTER — Ambulatory Visit: Payer: Medicare Other | Admitting: Hematology and Oncology

## 2021-12-06 NOTE — Progress Notes (Signed)
Patient Care Team: Midge Minium, MD as PCP - General (Family Medicine) [name], MD as Consulting Physician (Cardiology) Marylynn Pearson, MD as Consulting Physician (Ophthalmology) Nicholas Lose, MD as Consulting Physician (Hematology and Oncology) Kyung Rudd, MD as Consulting Physician (Radiation Oncology) Fanny Skates, MD as Consulting Physician (General Surgery) Manson Passey, Emerge (Specialist) Suella Broad, MD as Consulting Physician (Physical Medicine and Rehabilitation) Delice Bison, Charlestine Massed, NP as Nurse Practitioner (Hematology and Oncology)  DIAGNOSIS:  Encounter Diagnosis  Name Primary?   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, heterozygous   08/15/2015 Surgery   Right mastectomy: IDC grade 2, 2.2 cm, with associated DCIS intermediate grade, separate focus high-grade DCIS, 0/4 lymph nodes negative, T2 N0 stage II a, ER 90%, PR 5%, HER-2 negative duration 1.42, Ki-67 60%   09/22/2015 - 01/05/2016 Chemotherapy   Adjuvant chemotherapy with TCH 5 cycles followed by Herceptin maintenance for 1 year   03/15/2016 -  Anti-estrogen oral therapy   Anastrozole then tamoxifen and then Letrozole daily (stopped due to side effects) 11/02/17 restarted on 12/03/17 on Letrozole     CHIEF COMPLIANT: Follow-up of right breast cancer     INTERVAL HISTORY: Tracey Evans is a  76 y.o. with above-mentioned history of right breast cancer  treated with mastectomy, adjuvant chemotherapy, and who was on anti-estrogen therapy with letrozole but recently switched to exemestane which was completed last year. She presents to the clinic today for follow-up. She states that she is having a time of her life. She complains of pulling when she lifts her right arm. She does walk for exercise.   ALLERGIES:  is allergic to contrast media [iodinated contrast media], dilaudid [hydromorphone hcl], adhesive [tape], codeine, cymbalta [duloxetine hcl], lactose intolerance (gi), latex, nsaids, and lyrica [pregabalin].  MEDICATIONS:  Current Outpatient Medications  Medication Sig Dispense Refill   albuterol (VENTOLIN HFA) 108 (90 Base) MCG/ACT inhaler Inhale 2 puffs into the lungs every 6 (six) hours as needed for wheezing or shortness of breath. 1 each 1   amLODipine (NORVASC) 5 MG tablet Take 5 mg by mouth daily.     aspirin 81 MG tablet Take 81 mg by mouth daily.     atorvastatin (LIPITOR) 20 MG tablet Take 20 mg by mouth daily.     diclofenac Sodium (VOLTAREN) 1 % GEL Place onto the skin.     gabapentin (NEURONTIN) 300 MG capsule TAKE 1 TAB IN THE MORNING, 1 IN THE AFTERNOON, AND 2 TABS BEFORE BED 120 capsule 3   Glycerin-Polysorbate 80 (REFRESH DRY EYE THERAPY OP) Place 1 drop into both eyes 3 (three) times daily.     hydrOXYzine (ATARAX) 10 MG tablet Take 1 tablet (10 mg total) by mouth 3 (three) times daily as needed for itching. 30 tablet 1   levothyroxine (SYNTHROID) 75 MCG tablet TAKE 1 TABLET BY MOUTH EVERY DAY BEFORE BREAKFAST 90 tablet 1   magnesium oxide (MAG-OX) 400 (240 Mg) MG tablet Take 1 tablet (400 mg total) by mouth daily. (Patient  taking differently: Take 400 mg by mouth 2 (two) times daily.)     omeprazole (PRILOSEC) 20 MG capsule TAKE 1 CAPSULE BY MOUTH EVERY DAY 90 capsule 0   triamcinolone ointment (KENALOG) 0.1 % Apply 1 application topically 2 (two) times daily. 90 g 1   Wheat Dextrin (BENEFIBER) POWD Use as directed, daily   0   No current facility-administered medications for this visit.    PHYSICAL EXAMINATION: ECOG PERFORMANCE STATUS: 1 - Symptomatic but completely ambulatory  Vitals:   12/08/21 1134  BP: 116/86  Pulse: 72  Resp: 15  Temp: (!) 97.3 F (36.3 C)  SpO2: 97%   Filed Weights   12/08/21 1134  Weight: 183 lb (83 kg)     LABORATORY DATA:  I have reviewed the data as listed    Latest Ref Rng & Units 09/26/2021    9:44 AM 06/13/2021    9:01 AM 06/01/2021   10:07 AM  CMP  Glucose 70 - 99 mg/dL 108  67  107   BUN 6 - 23 mg/dL _0 Creatinine 0.40 - 1.20 mg/dL 0.84  0.83  0.90   Sodium 135 - 145 mEq/L 141  140  147   Potassium 3.5 - 5.1 mEq/L 4.4  3.8  4.8   Chloride 96 - 112 mEq/L 104  102  108   CO2 19 - 32 mEq/L _1 Calcium 8.4 - 10.5 mg/dL 10.0  9.4  9.7   Total Protein 6.0 - 8.3 g/dL  6.6  6.9   Total Bilirubin 0.2 - 1.2 mg/dL  0.4  0.3   Alkaline Phos 39 - 117 U/L  124  139   AST 0 - 37 U/L  13  21   ALT 0 - 35 U/L  11  20     Lab Results  Component Value Date   WBC 11.3 (H) 06/13/2021   HGB 13.8 06/13/2021   HCT 42.4 06/13/2021   MCV 92.3 06/13/2021   PLT 323.0 06/13/2021   NEUTROABS 7.2 06/13/2021    ASSESSMENT & PLAN:  Breast cancer of lower-outer quadrant of right female breast ([place]) Right mastectomy 08/15/2015: IDC grade 2, 2.2 cm, with associated DCIS intermediate grade, separate focus high-grade DCIS, 0/4 lymph nodes negative, T2 N0 stage II a, ER 90%, PR 5%, HER-2 negative duration 1.42, Ki-67 60%   Treatment plan: 1. Adjuvant chemotherapy with [place] 2. Followed by adjuvant antiestrogen therapy with anastrozole 5 years No role of radiation since she had mastectomy. ----------------------------------------------------------------------------------------------------------------------- Prior treatment: Completed 5 cycles of TCH, chemotherapy discontinued for neuropathy. Completed Herceptin maintenance 09/29/2006    Acute Diverticulitis: ED  visit 02/22/16 Shingles: Resolved   Plan: 1.Anastrozole 1 mg daily started in 03/15/16 discontinued 04/25/2016 because of pain in hands and feet, switched her to tamoxifen therapy. But she stopped taking tamoxifen as well. 3. Started letrozole 07/31/2016 stopped July 2019, resumed August 2019 4.  Tried exemestane but discontinued in 2022 because of rash   CT abdomen and pelvis 07/30/2016: Normal study      Emotional distress/body dysmorphic disorder: Hospitalization for abdominal pain June 2020: CT of the abdomen did not reveal any cause.    Breast cancer surveillance: 1.  Breast exam 12/08/2021: Benign left breast.  Right mastectomy. 2. mammogram left breast 12/08/2021: Benign   She has moved back to [place] last year and has been very happy with everything    No orders of the defined types were  placed in this encounter.  The patient has a good understanding of the overall plan. she agrees with it. she will call with any problems that may develop before the next visit here. Total time spent: 30 mins including face to face time and time spent for planning, charting and co-ordination of care   Harriette Ohara, MD 12/08/21    I Gardiner Coins am scribing for Dr. Lindi Adie  I have reviewed the above documentation for accuracy and completeness, and I agree with the above.

## 2021-12-08 ENCOUNTER — Inpatient Hospital Stay: Payer: Medicare Other | Attending: Hematology and Oncology | Admitting: Hematology and Oncology

## 2021-12-08 ENCOUNTER — Ambulatory Visit
Admission: RE | Admit: 2021-12-08 | Discharge: 2021-12-08 | Disposition: A | Discharge status: Home / Self Care | Payer: Medicare Other | Source: Ambulatory Visit | Attending: Family Medicine | Admitting: Family Medicine

## 2021-12-08 DIAGNOSIS — C50511 Malignant neoplasm of lower-outer quadrant of right female breast: Secondary | ICD-10-CM | POA: Diagnosis not present

## 2021-12-08 DIAGNOSIS — Z9221 Personal history of antineoplastic chemotherapy: Secondary | ICD-10-CM | POA: Insufficient documentation

## 2021-12-08 DIAGNOSIS — Z170 Estrogen receptor positive status [ER+]: Secondary | ICD-10-CM | POA: Insufficient documentation

## 2021-12-08 DIAGNOSIS — Z9011 Acquired absence of right breast and nipple: Secondary | ICD-10-CM | POA: Insufficient documentation

## 2021-12-08 DIAGNOSIS — Z1231 Encounter for screening mammogram for malignant neoplasm of breast: Secondary | ICD-10-CM

## 2021-12-08 DIAGNOSIS — Z79811 Long term (current) use of aromatase inhibitors: Secondary | ICD-10-CM | POA: Insufficient documentation

## 2021-12-08 NOTE — Assessment & Plan Note (Addendum)
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 Adjuvant chemotherapy with [place] 2. Followed by adjuvant antiestrogen therapy with anastrozole 5 years No role of radiation since she had mastectomy. ----------------------------------------------------------------------------------------------------------------------- Priortreatment: Completed 5 cycles ofTCH, chemotherapy discontinued for neuropathy.Completed Herceptin maintenance 09/29/2006  Acute Diverticulitis: ED visit 02/22/16 Shingles:Resolved  Plan: 1.Anastrozole 1 mg daily startedin 03/15/16 discontinued 04/25/2016 because of pain in hands and feet,switched her to tamoxifen therapy. But she stopped taking tamoxifen as well. 3. Started letrozole 4/24/2018stopped July 2019, resumed August 2019 4.  Tried exemestane but discontinued in 2022 because of rash  CT abdomen and pelvis 07/30/2016: Normal study    Emotional distress/body dysmorphic disorder:Hospitalization for abdominal pain June 2020: CT of the abdomen did not reveal any cause.   Breast cancer surveillance: 1.Breast exam9/04/2021: Benign left breast. Right mastectomy. 2.mammogramleft breast 12/08/2021: Benign  She has moved back to [place] last year and has been very happy with everything

## 2021-12-15 DIAGNOSIS — E039 Hypothyroidism, unspecified: Secondary | ICD-10-CM | POA: Diagnosis not present

## 2021-12-15 DIAGNOSIS — E785 Hyperlipidemia, unspecified: Secondary | ICD-10-CM | POA: Diagnosis not present

## 2021-12-15 DIAGNOSIS — I10 Essential (primary) hypertension: Secondary | ICD-10-CM | POA: Diagnosis not present

## 2021-12-15 DIAGNOSIS — I2510 Atherosclerotic heart disease of native coronary artery without angina pectoris: Secondary | ICD-10-CM | POA: Diagnosis not present

## 2021-12-18 DIAGNOSIS — Z23 Encounter for immunization: Secondary | ICD-10-CM | POA: Diagnosis not present

## 2021-12-18 DIAGNOSIS — M545 Low back pain, unspecified: Secondary | ICD-10-CM | POA: Diagnosis not present

## 2021-12-27 ENCOUNTER — Telehealth: Payer: Self-pay

## 2021-12-27 ENCOUNTER — Encounter: Payer: Self-pay | Admitting: Family Medicine

## 2021-12-27 ENCOUNTER — Other Ambulatory Visit: Payer: Self-pay

## 2021-12-27 ENCOUNTER — Ambulatory Visit (INDEPENDENT_AMBULATORY_CARE_PROVIDER_SITE_OTHER): Payer: Medicare Other | Admitting: Family Medicine

## 2021-12-27 VITALS — BP 102/70 | HR 73 | Temp 97.2°F | Resp 16 | Ht 64.0 in | Wt 189.4 lb

## 2021-12-27 DIAGNOSIS — T1490XA Injury, unspecified, initial encounter: Secondary | ICD-10-CM | POA: Diagnosis not present

## 2021-12-27 DIAGNOSIS — E6609 Other obesity due to excess calories: Secondary | ICD-10-CM

## 2021-12-27 DIAGNOSIS — E1169 Type 2 diabetes mellitus with other specified complication: Secondary | ICD-10-CM

## 2021-12-27 DIAGNOSIS — E119 Type 2 diabetes mellitus without complications: Secondary | ICD-10-CM

## 2021-12-27 DIAGNOSIS — Z6832 Body mass index (BMI) 32.0-32.9, adult: Secondary | ICD-10-CM | POA: Diagnosis not present

## 2021-12-27 DIAGNOSIS — E875 Hyperkalemia: Secondary | ICD-10-CM

## 2021-12-27 DIAGNOSIS — E785 Hyperlipidemia, unspecified: Secondary | ICD-10-CM | POA: Diagnosis not present

## 2021-12-27 LAB — HEPATIC FUNCTION PANEL
ALT: 22 U/L (ref 0–35)
AST: 13 U/L (ref 0–37)
Albumin: 3.9 g/dL (ref 3.5–5.2)
Alkaline Phosphatase: 103 U/L (ref 39–117)
Bilirubin, Direct: 0.1 mg/dL (ref 0.0–0.3)
Total Bilirubin: 0.5 mg/dL (ref 0.2–1.2)
Total Protein: 6.7 g/dL (ref 6.0–8.3)

## 2021-12-27 LAB — BASIC METABOLIC PANEL
BUN: 15 mg/dL (ref 6–23)
CO2: 32 mEq/L (ref 19–32)
Calcium: 9.7 mg/dL (ref 8.4–10.5)
Chloride: 105 mEq/L (ref 96–112)
Creatinine, Ser: 0.92 mg/dL (ref 0.40–1.20)
GFR: 60.73 mL/min (ref >60.00)
Glucose, Bld: 97 mg/dL (ref 70–99)
Potassium: 5.7 mEq/L — ABNORMAL HIGH (ref 3.5–5.1)
Sodium: 143 mEq/L (ref 135–145)

## 2021-12-27 LAB — CBC WITH DIFFERENTIAL/PLATELET
Basophils Absolute: 0 10*3/uL (ref 0.0–0.1)
Basophils Relative: 0.4 % (ref 0.0–3.0)
Eosinophils Absolute: 0.2 10*3/uL (ref 0.0–0.7)
Eosinophils Relative: 2.3 % (ref 0.0–5.0)
HCT: 43.1 % (ref 36.0–46.0)
Hemoglobin: 13.9 g/dL (ref 12.0–15.0)
Lymphocytes Relative: 32 % (ref 12.0–46.0)
Lymphs Abs: 2.9 10*3/uL (ref 0.7–4.0)
MCHC: 32.3 g/dL (ref 30.0–36.0)
MCV: 93.9 fl (ref 78.0–100.0)
Monocytes Absolute: 0.8 10*3/uL (ref 0.1–1.0)
Monocytes Relative: 8.2 % (ref 3.0–12.0)
Neutro Abs: 5.2 10*3/uL (ref 1.4–7.7)
Neutrophils Relative %: 57.1 % (ref 43.0–77.0)
Platelets: 271 10*3/uL (ref 150.0–400.0)
RBC: 4.59 Mil/uL (ref 3.87–5.11)
RDW: 14.9 % (ref 11.5–15.5)
WBC: 9.1 10*3/uL (ref 4.0–10.5)

## 2021-12-27 LAB — LIPID PANEL
Cholesterol: 134 mg/dL (ref 0–200)
HDL: 54.3 mg/dL (ref >39.00)
LDL Cholesterol: 64 mg/dL (ref 0–99)
NonHDL: 80.16
Total CHOL/HDL Ratio: 2
Triglycerides: 81 mg/dL (ref 0.0–149.0)
VLDL: 16.2 mg/dL (ref 0.0–40.0)

## 2021-12-27 LAB — HEMOGLOBIN A1C: Hgb A1c MFr Bld: 6.7 % — ABNORMAL HIGH (ref 4.6–6.5)

## 2021-12-27 LAB — TSH: TSH: 2.98 u[IU]/mL (ref 0.35–5.50)
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
## 2021-12-27 NOTE — Assessment & Plan Note (Signed)
Chronic problem.  Last A1C 6.5 and currently diet controlled.  UTD on eye exam, foot exam, and microalbumin.  Currently asymptomatic.  Check labs and start meds prn.
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
## 2021-12-27 NOTE — Assessment & Plan Note (Signed)
Chronic problem.  Currently on Lipitor 26m daily w/o difficulty.  Check labs.  Adjust meds prn

## 2021-12-27 NOTE — Assessment & Plan Note (Signed)
Deteriorated.  Pt has been stress eating w/ sister's nursing home placement and has gained 6 lbs.  Encouraged low carb/low sugar diet and regular physical activity.  Will continue to follow.

## 2021-12-27 NOTE — Telephone Encounter (Signed)
Informed pt of lab results . Order for recheck on potassium is in and lab only visit is made

## 2021-12-27 NOTE — Telephone Encounter (Signed)
-----   Message from Midge Minium, MD sent at 12/27/2021  3:29 PM EDT ----- A1C is up slightly- try and limit your stress eating Your potassium is a little high- make sure you are drinking plenty of water and at least temporarily decrease your intake of bananas, citrus, leafy greens.  We'll recheck this at a lab only visit on Friday (dx- hyperkalemia) Remainder of labs look great!

## 2021-12-29 ENCOUNTER — Other Ambulatory Visit (INDEPENDENT_AMBULATORY_CARE_PROVIDER_SITE_OTHER): Payer: Medicare Other

## 2021-12-29 DIAGNOSIS — E875 Hyperkalemia: Secondary | ICD-10-CM

## 2021-12-29 LAB — POTASSIUM: Potassium: 6 mEq/L — ABNORMAL HIGH (ref 3.5–5.1)

## 2021-12-30 ENCOUNTER — Emergency Department (HOSPITAL_BASED_OUTPATIENT_CLINIC_OR_DEPARTMENT_OTHER)
Admission: EM | Admit: 2021-12-30 | Discharge: 2021-12-30 | Disposition: A | Discharge status: Home / Self Care | Payer: Medicare Other | Attending: Emergency Medicine | Admitting: Emergency Medicine

## 2021-12-30 ENCOUNTER — Encounter (HOSPITAL_BASED_OUTPATIENT_CLINIC_OR_DEPARTMENT_OTHER): Payer: Self-pay | Admitting: Emergency Medicine

## 2021-12-30 ENCOUNTER — Other Ambulatory Visit: Payer: Self-pay

## 2021-12-30 DIAGNOSIS — I10 Essential (primary) hypertension: Secondary | ICD-10-CM | POA: Insufficient documentation

## 2021-12-30 DIAGNOSIS — Z79899 Other long term (current) drug therapy: Secondary | ICD-10-CM | POA: Insufficient documentation

## 2021-12-30 DIAGNOSIS — J45909 Unspecified asthma, uncomplicated: Secondary | ICD-10-CM | POA: Diagnosis not present

## 2021-12-30 DIAGNOSIS — E119 Type 2 diabetes mellitus without complications: Secondary | ICD-10-CM | POA: Insufficient documentation

## 2021-12-30 DIAGNOSIS — L03213 Periorbital cellulitis: Secondary | ICD-10-CM

## 2021-12-30 DIAGNOSIS — Z91040 Latex allergy status: Secondary | ICD-10-CM | POA: Insufficient documentation

## 2021-12-30 DIAGNOSIS — Z7982 Long term (current) use of aspirin: Secondary | ICD-10-CM | POA: Insufficient documentation

## 2021-12-30 DIAGNOSIS — H5712 Ocular pain, left eye: Secondary | ICD-10-CM | POA: Diagnosis present

## 2021-12-30 MED ORDER — ERYTHROMYCIN 5 MG/GM OP OINT: TOPICAL_OINTMENT | OPHTHALMIC | 0 refills | Status: DC

## 2021-12-30 MED ORDER — TETRACAINE HCL 0.5 % OP SOLN
2.0000 [drp] | Freq: Once | OPHTHALMIC | Status: AC
  Administered 2021-12-30: 2 [drp] via OPHTHALMIC
  Filled 2021-12-30: qty 4

## 2021-12-30 MED ORDER — AMOXICILLIN-POT CLAVULANATE 875-125 MG PO TABS: 1.0000 | ORAL_TABLET | Freq: Two times a day (BID) | ORAL | 0 refills | Status: DC

## 2021-12-30 MED ORDER — FLUORESCEIN SODIUM 1 MG OP STRP
1.0000 | ORAL_STRIP | Freq: Once | OPHTHALMIC | Status: AC
  Administered 2021-12-30: 1 via OPHTHALMIC
  Filled 2021-12-30: qty 1

## 2021-12-30 NOTE — ED Provider Notes (Signed)
[facility] EMERGENCY DEPARTMENT Provider Note   CSN: [id number] Arrival date & time: 12/30/21  1937     History  Chief Complaint  Patient presents with   Eye Pain    Tracey Evans is a 76 y.o. female.   Eye Pain   76 year old female presents emergency department with complaints of left eye pain.  Patient states the pain has gradually gotten worse since onset a couple days ago.  She notes some increased intensity of eye pain with light.  Notes some feelings of foreign body sensation.  She states that her upper eyelid is extremely tender to palpation.  Denies visual deficits.  Patient states she wears glasses at baseline but no contacts.  She has a history of similar occurrence approximately 3 years ago.  Denies any direct trauma or known foreign body/substance in affected eye.  Denies fever, chills, night sweats, pain with eye movements.  Denies prior diagnosis of glaucoma.  Past medical history significant for diabetes, hypertension, hyperlipidemia, chronic bronchitis, asthma, right breast malignancy, left breast malignancy,  Home Medications Prior to Admission medications   Medication Sig Start Date End Date Taking? Authorizing Provider  amoxicillin-clavulanate (AUGMENTIN) 875-125 MG tablet Take 1 tablet by mouth every 12 (twelve) hours. 12/30/21  Yes Dion Saucier A, PA  erythromycin ophthalmic ointment Place a 1/2 inch ribbon of ointment into the lower eyelid. 12/30/21  Yes Dion Saucier A, PA  albuterol (VENTOLIN HFA) 108 (90 Base) MCG/ACT inhaler Inhale 2 puffs into the lungs every 6 (six) hours as needed for wheezing or shortness of breath. 02/27/21   Wendie Agreste, MD  amLODipine (NORVASC) 5 MG tablet Take 5 mg by mouth daily.    [provider]  aspirin 81 MG tablet Take 81 mg by mouth daily.    [provider]  atorvastatin (LIPITOR) 20 MG tablet Take 20 mg by mouth daily.    [provider]  gabapentin (NEURONTIN) 300 MG  capsule TAKE 1 TAB IN THE MORNING, 1 IN THE AFTERNOON, AND 2 TABS BEFORE BED 11/07/21   Midge Minium, MD  Glycerin-Polysorbate 80 (REFRESH DRY EYE THERAPY OP) Place 1 drop into both eyes 3 (three) times daily.    [provider]  levothyroxine (SYNTHROID) 75 MCG tablet TAKE 1 TABLET BY MOUTH EVERY DAY BEFORE BREAKFAST 10/02/21   Midge Minium, MD  magnesium oxide (MAG-OX) 400 (240 Mg) MG tablet Take 1 tablet (400 mg total) by mouth daily. Patient taking differently: Take 400 mg by mouth 2 (two) times daily. 12/07/20   Nicholas Lose, MD  omeprazole (PRILOSEC) 20 MG capsule TAKE 1 CAPSULE BY MOUTH EVERY DAY 10/02/21   Midge Minium, MD  triamcinolone ointment (KENALOG) 0.1 % Apply 1 application topically 2 (two) times daily. 05/09/21 05/09/22  Midge Minium, MD  Wheat Dext[facility]) POWD Use as directed, daily 05/31/21   Vladimir Crofts, PA-C      Allergies    Contrast media [iodinated contrast media], Dilaudid [hydromorphone hcl], Adhesive [tape], Codeine, Cymbalta [duloxetine hcl], Lactose intolerance (gi), Latex, Nsaids, and Lyrica [pregabalin]    Review of Systems   Review of Systems  Eyes:  Positive for pain.    Physical Exam Updated Vital Signs BP (!) 132/92   Pulse 86   Temp 98.2 F (36.8 C) (Oral)   Resp 16   SpO2 98%  Physical Exam Vitals and nursing note reviewed.  Constitutional:      General: She is not in acute distress.  Appearance: She is well-developed.  HENT:     Head: Normocephalic and atraumatic.  Eyes:     General: Lids are everted, no foreign bodies appreciated. Vision grossly intact. Gaze aligned appropriately.     Intraocular pressure: Left eye pressure is 17 mmHg. Measurements were taken using a handheld tonometer.    Extraocular Movements:     Right eye: Normal extraocular motion and no nystagmus.     Left eye: Normal extraocular motion and no nystagmus.     Conjunctiva/sclera: Conjunctivae normal.     Comments: Patient  has erythematic left conjunctiva with no obvious purulent drainage.  Pupils equal round reactive to light bilaterally.  Patient has no pain with EOMs.  Patient has extreme tenderness to palpation of the left upper lid with secondary swelling.  Mild overlying erythematous skin changes.  Eyelids not proptotic in appearance.  No obvious chalazion/hordeolum formation.  Fluorescein uptake showed small linear abrasion noted on the inferior of left cornea.  Seidel sign negative.  Cardiovascular:     Rate and Rhythm: Normal rate and regular rhythm.     Heart sounds: No murmur heard. Pulmonary:     Effort: Pulmonary effort is normal. No respiratory distress.     Breath sounds: Normal breath sounds.  Abdominal:     Palpations: Abdomen is soft.     Tenderness: There is no abdominal tenderness.  Musculoskeletal:        General: No swelling.     Cervical back: Neck supple.  Skin:    General: Skin is warm and dry.     Capillary Refill: Capillary refill takes less than 2 seconds.  Neurological:     Mental Status: She is alert.  Psychiatric:        Mood and Affect: Mood normal.     ED Results / Procedures / Treatments   Labs (all labs ordered are listed, but only abnormal results are displayed) Labs Reviewed - No data to display  EKG None  Radiology No results found.  Procedures Procedures    Medications Ordered in ED Medications  fluorescein ophthalmic strip 1 strip (1 strip Both Eyes Given by Other 12/30/21 0928)  tetracaine (PONTOCAINE) 0.5 % ophthalmic solution 2 drop (2 drops Both Eyes Given by Other 12/30/21 4580)    ED Course/ Medical Decision Making/ A&P                           Medical Decision Making Risk Prescription drug management.   This patient presents to the ED for concern of eye pain, this involves an extensive number of treatment options, and is a complaint that carries with it a high risk of complications and morbidity.  The differential diagnosis includes  glaucoma, postseptal cellulitis, preseptal cellulitis, globe perforation, foreign body retention, corneal abrasion, corneal ulcer   Co morbidities that complicate the patient evaluation  See HPI   Additional history obtained:  Additional history obtained from EMR   Lab Tests:  N/a   Imaging Studies ordered:  N/a   Cardiac Monitoring: / EKG:  The patient was maintained on a cardiac monitor.  I personally viewed and interpreted the cardiac monitored which showed an underlying rhythm of: Sinus rhythm   Consultations Obtained:  N/a   Problem List / ED Course / Critical interventions / Medication management  Eye pain I ordered medication including tetracaine for topical anesthetic as well as fluorescein strip for eye staining   Reevaluation of the patient after these medicines  showed that the patient improved I have reviewed the patients home medicines and have made adjustments as needed   Social Determinants of Health:  Denies tobacco, illicit drug use   Test / Admission - Considered:  Preseptal cellulitis Vitals signs significant for mild hypertension with a blood pressure 132/92.  Recommend close follow-up with PCP regarding elevation of blood pressure.. Otherwise within normal range and stable throughout visit. Imaging studies considered the patient presentation fits most with preseptal cellulitis.  No concern for orbital cellulitis given no pain with EOMs, nonproptotic nature and reassuring physical exam.  Patient will prescribe oral antibiotics of Augmentin as well as topical antibiotics of erythromycin given possible corneal abrasion noted on physical exam.  Patient recommended close follow-up with the ophthalmologist/optometrist in 2 to 3 days for reevaluation.  Treatment plan was discussed with patient and she knowledge understanding was agreeable to said plan. Worrisome signs and symptoms were discussed with the patient, and the patient acknowledged  understanding to return to the ED if noticed. Patient was stable upon discharge.          Final Clinical Impression(s) / ED Diagnoses Final diagnoses:  Preseptal cellulitis of left upper eyelid    Rx / DC Orders ED Discharge Orders          Ordered    amoxicillin-clavulanate (AUGMENTIN) 875-125 MG tablet  Every 12 hours        12/30/21 1045    erythromycin ophthalmic ointment        12/30/21 [place], [place] A, [place] 12/30/21 1048    [place], [place] K, DO 12/30/21 1303

## 2021-12-30 NOTE — ED Notes (Signed)
ED Provider at bedside. 

## 2021-12-30 NOTE — Discharge Instructions (Addendum)
Note the work-up today was overall consistent with something called preseptal cellulitis or bacterial infection of your upper eyelid.  We will treat this with oral antibiotics as well as an ointment to place on your eye.  Please take the oral antibiotics twice daily for the next 7 days.  Also use antibiotic ointment as described.  I recommend close follow-up with your eye doctor in 2 to 3 days for reevaluation of your symptoms.  Please hesitate to return to the emergency department the worrisome signs and symptoms we discussed become apparent.

## 2021-12-30 NOTE — ED Triage Notes (Signed)
Pt reports L eye pain that began the day before yesterday, but gradually got worse. No known injury. Pain worse with light. Has had similar symptoms before but not sure what her diagnosis was.

## 2022-01-01 ENCOUNTER — Telehealth: Payer: Self-pay

## 2022-01-01 ENCOUNTER — Other Ambulatory Visit: Payer: Self-pay

## 2022-01-01 ENCOUNTER — Telehealth: Payer: Self-pay | Admitting: Family Medicine

## 2022-01-01 DIAGNOSIS — M791 Myalgia, unspecified site: Secondary | ICD-10-CM | POA: Diagnosis not present

## 2022-01-01 DIAGNOSIS — E875 Hyperkalemia: Secondary | ICD-10-CM

## 2022-01-01 DIAGNOSIS — M5451 Vertebrogenic low back pain: Secondary | ICD-10-CM | POA: Diagnosis not present

## 2022-01-01 DIAGNOSIS — M25561 Pain in right knee: Secondary | ICD-10-CM | POA: Diagnosis not present

## 2022-01-01 DIAGNOSIS — M25562 Pain in left knee: Secondary | ICD-10-CM | POA: Diagnosis not present

## 2022-01-01 MED ORDER — LISINOPRIL-HYDROCHLOROTHIAZIDE 10-12.5 MG PO TABS: 1.0000 | ORAL_TABLET | Freq: Every day | ORAL | 3 refills | Status: DC

## 2022-01-01 NOTE — Telephone Encounter (Signed)
Chief Complaint Lab Result (Critical or Stat) Call Type Lab Send to RN Reason for Call Report lab results Initial Comment Caller states he has critical results Translation No Nurse Assessment Nurse: Glean Salvo, RN, Magda Paganini Date/Time Eilene Ghazi Time): 12/29/2021 5:06:17 PM Is there an on-call provider listed? ---Yes Please list name of person reporting value (Lab Employee) and a contact number. ---Cleda [facility] [phone] Please document the following items: Lab name Lab value (read back to lab to verify) Reference range for lab value Date and time blood was drawn Collect time of birth for bilirubin results ---Potassium 6.0 - no hemolysis Please collect the patient contact information from the lab. (name, phone number and address) ---Blane Ohara [phone] Disp. Time Eilene Ghazi Time) Disposition Final User 12/29/2021 5:14:00 PM Attempt made - message left Rebecca Eaton 12/29/2021 5:34:50 PM Attempt made - message left Rebecca Eaton 12/29/2021 6:19:28 PM Clinical Call Yes Glean Salvo RN, Magda Paganini Final Disposition 12/29/2021 6:19:28 PM Clinical Call Yes Glean Salvo, RN, Magda Paganini PLEASE NOTE: All timestamps contained within this report are represented as [place] Standard Time. CONFIDENTIALTY NOTICE: This fax transmission is intended only for the addressee. It contains information that is legally privileged, confidential or otherwise protected from use or disclosure. If you are not the intended recipient, you are strictly prohibited from reviewing, disclosing, copying using or disseminating any of this information or taking any action in reliance on or regarding this information. If you have received this fax in error, please notify us immediately by telephone so that we can arrange for its return to [place]. Phone: [phone], Toll-Free: [phone], Fax: [phone] Page: 2 of 2 Call Id: [id number] Comments User: Beulah Gandy, RN Date/Time Eilene Ghazi Time): 12/29/2021 6:08:00 PM Pt denies  potassium supplement or ACE inhibitors User: Beulah Gandy, RN Date/Time Eilene Ghazi Time): 12/29/2021 6:17:56 PM Advised on increased fluid intake and decreased potassium intake

## 2022-01-01 NOTE — Telephone Encounter (Signed)
-----   Message from Midge Minium, MD sent at 12/31/2021  7:20 PM EDT ----- Potassium is even higher than last check.  You are not on any prescription medications that would cause high potassium, but please make sure you aren't taking any supplements that contain potassium.  I want to add HCTZ 12.'5mg'$  daily (#30, 3 refills) to address your mildly elevated BP in the ER and also decrease your potassium.  We will repeat your BMP (dx hyperkalemia) at a lab visit on Wed or Thursday.

## 2022-01-01 NOTE — Telephone Encounter (Signed)
I will call pt upon my return to the office this afternoon

## 2022-01-01 NOTE — Telephone Encounter (Signed)
Spoke w/ pt and advised of lab results . Pt is coming in on Wed for lab only visit  to repeat BMP some one had placed the lab order already . HCTZ has been sent to pharmacy

## 2022-01-01 NOTE — Telephone Encounter (Signed)
Orders placed for future and pt is scheduled Wed morning lab only

## 2022-01-02 DIAGNOSIS — H04123 Dry eye syndrome of bilateral lacrimal glands: Secondary | ICD-10-CM | POA: Diagnosis not present

## 2022-01-02 DIAGNOSIS — H40042 Steroid responder, left eye: Secondary | ICD-10-CM | POA: Diagnosis not present

## 2022-01-02 DIAGNOSIS — H20022 Recurrent acute iridocyclitis, left eye: Secondary | ICD-10-CM | POA: Diagnosis not present

## 2022-01-03 ENCOUNTER — Other Ambulatory Visit (INDEPENDENT_AMBULATORY_CARE_PROVIDER_SITE_OTHER): Payer: Medicare Other

## 2022-01-03 DIAGNOSIS — E875 Hyperkalemia: Secondary | ICD-10-CM | POA: Diagnosis not present

## 2022-01-03 LAB — POTASSIUM: Potassium: 5.3 mEq/L — ABNORMAL HIGH (ref 3.5–5.1)

## 2022-01-03 NOTE — Progress Notes (Signed)
Spoke w/ pt and advised of lab results  

## 2022-01-05 ENCOUNTER — Telehealth: Payer: Self-pay | Admitting: Family Medicine

## 2022-01-05 NOTE — Telephone Encounter (Signed)
Pt called; states that since starting fluid pills (lisinopril), has been experiencing "very bad" headaches, specifically pain in the back of her head and around her temples; wondering if this is a side effect of the lisinopril.

## 2022-01-05 NOTE — Telephone Encounter (Signed)
Spoke w/ pt and advised her that Lisinopril typically does not cause those symptoms . I suggested she call her eye dr as he started her on new eye drops

## 2022-01-08 DIAGNOSIS — Z23 Encounter for immunization: Secondary | ICD-10-CM | POA: Diagnosis not present

## 2022-01-18 ENCOUNTER — Encounter: Payer: Self-pay | Admitting: Family Medicine

## 2022-01-22 ENCOUNTER — Encounter: Payer: Self-pay | Admitting: Family Medicine

## 2022-01-22 ENCOUNTER — Ambulatory Visit (INDEPENDENT_AMBULATORY_CARE_PROVIDER_SITE_OTHER): Payer: Medicare Other | Admitting: Family Medicine

## 2022-01-22 VITALS — BP 128/82 | HR 71 | Temp 98.2°F | Resp 17 | Ht 64.0 in | Wt 188.4 lb

## 2022-01-22 DIAGNOSIS — R29898 Other symptoms and signs involving the musculoskeletal system: Secondary | ICD-10-CM | POA: Diagnosis not present

## 2022-01-22 NOTE — Patient Instructions (Signed)
Follow up as needed or as scheduled We'll call you with your neurology referral to assess your weakness Call with any questions or concerns Stay Safe!  Stay Healthy! Happy Birthday!!!

## 2022-01-22 NOTE — Progress Notes (Signed)
   Subjective:    Patient ID: Tracey Evans, female    DOB: 1945-10-30, 76 y.o.   MRN: [id number]  HPI Muscle weakness- sxs started ~6 months ago.  Sxs are bilateral.  Worsening over time.  Pt reports weakness in holding her phone or tablet for extended periods of time.  Also notes that when she is trying to type she has weakness holding her wrists above the screen and her hands will drop down and fingers start touching wrong keys.  Pt reports she has never dropped anything.  Pt reports she is 'very conscious of it'.  She is starting to accommodate the weakness by changing some of her routine- watering plants, etc.  Denies numbness/tingling.     Review of Systems For ROS see HPI     Objective:   Physical Exam Vitals reviewed.  Constitutional:      General: She is not in acute distress.    Appearance: Normal appearance. She is not ill-appearing.  HENT:     Head: Normocephalic and atraumatic.  Cardiovascular:     Pulses: Normal pulses.  Skin:    General: Skin is warm and dry.  Neurological:     General: No focal deficit present.     Mental Status: She is alert and oriented to person, place, and time.     Cranial Nerves: No cranial nerve deficit.     Motor: No weakness.     Comments: Grip strength 5/5 bilaterally  Psychiatric:        Mood and Affect: Mood normal.        Behavior: Behavior normal.        Thought Content: Thought content normal.           Assessment & Plan:   Weakness of both hands- new.  Unclear if this is hand weakness, wrist weakness, or some other issue.  She has hx of carpal tunnel surgery bilaterally and she is wondering if this is related to that.  Given that she has no numbness/tingling/burning it doesn't seem consistent w/ carpal tunnel.  Will refer to Neuro for a complete evaluation and possibly hand specialist if needed.  Pt expressed understanding and is in agreement w/ plan.

## 2022-01-25 ENCOUNTER — Encounter: Payer: Self-pay | Admitting: Family Medicine

## 2022-01-25 NOTE — Telephone Encounter (Signed)
Patient needs an appointment should I have her stop the lisinopril until she sees you?

## 2022-01-25 NOTE — Telephone Encounter (Signed)
Patient was advised and did schedule for tomorrow morning

## 2022-01-26 ENCOUNTER — Ambulatory Visit (INDEPENDENT_AMBULATORY_CARE_PROVIDER_SITE_OTHER): Payer: Medicare Other | Admitting: Family Medicine

## 2022-01-26 ENCOUNTER — Encounter: Payer: Self-pay | Admitting: Family Medicine

## 2022-01-26 VITALS — BP 130/78 | HR 84 | Temp 97.7°F | Resp 16 | Ht 64.0 in | Wt 189.5 lb

## 2022-01-26 DIAGNOSIS — R42 Dizziness and giddiness: Secondary | ICD-10-CM | POA: Diagnosis not present

## 2022-01-26 NOTE — Patient Instructions (Addendum)
Follow up as needed or as scheduled STOP the Lisinopril HCTZ Continue to drink LOTS of water If you continue to have post nasal drip- add Claritin or Zyrtec (generic is just fine) for the allergy component Call with any questions or concerns Stay Safe!  Stay Healthy!! Elbert Ewings in there!!!

## 2022-01-26 NOTE — Progress Notes (Signed)
   Subjective:    Patient ID: Tracey Evans, female    DOB: Dec 05, 1945, 76 y.o.   MRN: [id number]  HPI Dizziness- pt reports sxs started when she began the Lisinopril HCTZ.  Pt reports sxs resolve when she doesn't take the pill.  Last took dose yesterday.  Since pt did not take the medication today, she feels ok.  Has also developed a cough since starting medication.   Review of Systems For ROS see HPI     Objective:   Physical Exam Vitals reviewed.  Constitutional:      General: She is not in acute distress.    Appearance: Normal appearance. She is not ill-appearing.  HENT:     Head: Normocephalic and atraumatic.  Eyes:     Extraocular Movements: Extraocular movements intact.     Conjunctiva/sclera: Conjunctivae normal.     Pupils: Pupils are equal, round, and reactive to light.  Cardiovascular:     Rate and Rhythm: Normal rate and regular rhythm.     Pulses: Normal pulses.  Pulmonary:     Effort: Pulmonary effort is normal. No respiratory distress.     Breath sounds: Normal breath sounds. No wheezing or rhonchi.     Comments: + cough Musculoskeletal:     Cervical back: Normal range of motion and neck supple.  Lymphadenopathy:     Cervical: No cervical adenopathy.  Neurological:     General: No focal deficit present.     Mental Status: She is alert and oriented to person, place, and time.  Psychiatric:        Mood and Affect: Mood normal.        Behavior: Behavior normal.        Thought Content: Thought content normal.           Assessment & Plan:   Dizziness- new.  Upon reviewing pt's recent labs and medication adjustments, I suspect her dizziness is related to the Lisinopril HCTZ that was started on 9/25.  It is likely dropping her BP too low and causing some dehydration.  Will stop medication, increase water intake.  The cough is likely an ACE cough and should resolve now that medication has been d/c'd.  Pt expressed understanding and is in agreement w/  plan.

## 2022-01-30 ENCOUNTER — Other Ambulatory Visit: Payer: Self-pay

## 2022-01-30 DIAGNOSIS — H40042 Steroid responder, left eye: Secondary | ICD-10-CM | POA: Diagnosis not present

## 2022-01-30 DIAGNOSIS — H20022 Recurrent acute iridocyclitis, left eye: Secondary | ICD-10-CM | POA: Diagnosis not present

## 2022-01-30 DIAGNOSIS — H04123 Dry eye syndrome of bilateral lacrimal glands: Secondary | ICD-10-CM | POA: Diagnosis not present

## 2022-01-30 NOTE — Progress Notes (Signed)
D/C lisinopril-HCTZ per provider.

## 2022-02-03 ENCOUNTER — Encounter: Payer: Self-pay | Admitting: Family Medicine

## 2022-02-22 MED ORDER — MELOXICAM 15 MG PO TABS: 15.0000 mg | ORAL_TABLET | Freq: Every day | ORAL | 3 refills | Status: DC

## 2022-03-04 ENCOUNTER — Other Ambulatory Visit: Payer: Self-pay | Admitting: Family Medicine

## 2022-03-04 DIAGNOSIS — K219 Gastro-esophageal reflux disease without esophagitis: Secondary | ICD-10-CM

## 2022-03-05 ENCOUNTER — Other Ambulatory Visit: Payer: Self-pay | Admitting: Family Medicine

## 2022-03-05 DIAGNOSIS — E039 Hypothyroidism, unspecified: Secondary | ICD-10-CM

## 2022-03-08 ENCOUNTER — Ambulatory Visit (INDEPENDENT_AMBULATORY_CARE_PROVIDER_SITE_OTHER): Payer: Medicare Other | Admitting: Diagnostic Neuroimaging

## 2022-03-08 ENCOUNTER — Encounter: Payer: Self-pay | Admitting: Diagnostic Neuroimaging

## 2022-03-08 VITALS — BP 135/87 | HR 68 | Ht 64.0 in | Wt 194.0 lb

## 2022-03-08 DIAGNOSIS — R252 Cramp and spasm: Secondary | ICD-10-CM | POA: Diagnosis not present

## 2022-03-08 NOTE — Progress Notes (Signed)
[facility]  PATIENT: Tracey Evans DOB: 1945/04/26  REFERRING CLINICIAN: Midge Minium, MD HISTORY FROM: patient  REASON FOR VISIT: new consult    HISTORICAL  CHIEF COMPLAINT:  Chief Complaint  Patient presents with   Extremity Weakness    RM 7 alone Pt is well, has been having hand weakness for about 6 months. No other symptoms     HISTORY OF PRESENT ILLNESS:   76 year old female here for evaluation of intermittent finger problem.  For past excess patient notes that when she is using her phone, tablet or doing crafts sometimes her fingers will intermittently contract and flex.  Sometimes she actually hits a key in her computer when she is typing.  This can happen after 1 to 5 minutes of activity.  Other times she does not have any problem.    Patient has history of bilateral carpal tunnel syndrome, status post bilateral carpal tunnel surgeries in the 1990s.  She had good result from this.  No recurrence of symptoms.  Also had breast cancer status post chemotherapy in 2017, with resultant mild numbness in fingers and hands and feet.    REVIEW OF SYSTEMS: Full 14 system review of systems performed and negative with exception of: as per HPI.  ALLERGIES: Allergies  Allergen Reactions   Contrast Media [Iodinated Contrast Media] Shortness Of Breath    Patient has SOB, tightness in chest and feels like an elephant is sitting on chest, patient should be  scanned at hospital Per Dr. Alvester Chou   Dilaudid [Hydromorphone Hcl] Anaphylaxis    Pt stopped breathing   Adhesive [Tape]     blister   Codeine Nausea Only    insomnia   Cymbalta [Duloxetine Hcl] Other (See Comments)    Hallucinations.    Lactose Intolerance (Gi) Diarrhea   Latex     REACTION: burns skin- Latex tape only    Nsaids    Lyrica [Pregabalin] Other (See Comments)    Mood swings.     HOME MEDICATIONS: Outpatient Medications Prior to Visit  Medication Sig Dispense Refill    albuterol (VENTOLIN HFA) 108 (90 Base) MCG/ACT inhaler Inhale 2 puffs into the lungs every 6 (six) hours as needed for wheezing or shortness of breath. 1 each 1   amLODipine (NORVASC) 5 MG tablet Take 5 mg by mouth daily.     aspirin 81 MG tablet Take 81 mg by mouth daily.     atorvastatin (LIPITOR) 20 MG tablet Take 20 mg by mouth daily.     gabapentin (NEURONTIN) 300 MG capsule TAKE 1 TAB IN THE MORNING, 1 IN THE AFTERNOON, AND 2 TABS BEFORE BED 120 capsule 3   Glycerin-Polysorbate 80 (REFRESH DRY EYE THERAPY OP) Place 1 drop into both eyes 3 (three) times daily.     levothyroxine (SYNTHROID) 75 MCG tablet TAKE 1 TABLET BY MOUTH EVERY DAY BEFORE BREAKFAST 90 tablet 1   Magnesium 400 MG CAPS Take by mouth 2 (two) times daily.     meloxicam (MOBIC) 15 MG tablet Take 1 tablet (15 mg total) by mouth daily. 30 tablet 3   omeprazole (PRILOSEC) 20 MG capsule TAKE 1 CAPSULE BY MOUTH EVERY DAY 90 capsule 0   Wheat Dextrin (BENEFIBER) POWD Use as directed, daily  0   erythromycin ophthalmic ointment Place a 1/2 inch ribbon of ointment into the lower eyelid. 3.5 g 0   triamcinolone ointment (KENALOG) 0.1 % Apply 1 application topically 2 (two) times daily. 90 g 1   No  facility-administered medications prior to visit.    PAST MEDICAL HISTORY: Past Medical History:  Diagnosis Date   Angina at rest    "occurs whenever it wants to, but worse during agitation"   Anxiety    Asthma    Binge eating disorder    Breast cancer of lower-outer quadrant of left female breast ([place]) 07/08/2015   "NEVER HAD LEFT BREAST CANCER" (08/15/2015)   Cancer of right breast ([place]) 08/15/2015   Chronic bronchitis ([facility])    Chronic lower back pain    Complication of anesthesia    had bronc spasms during intubation surgery 2009 on foot-need albuterol inhaler or neb tx preop   Costochondritis    Depression    Diverticular disease    11-2020 bleed   Fibromyalgia    GERD (gastroesophageal reflux disease)    Hot flashes     Hyperlipidemia    Hypertension    Hypothyroidism    Osteoarthritis    "all over"   Peptic ulcer disease    Personal history of chemotherapy 2017   Shortness of breath dyspnea    Type II diabetes mellitus ([place])    "diet controlled" (08/15/2015)    PAST SURGICAL HISTORY: Past Surgical History:  Procedure Laterality Date   ABDOMINAL HYSTERECTOMY     "left my ovaries"   ANKLE ARTHROSCOPY  01/02/2012   Procedure: ANKLE ARTHROSCOPY;  Surgeon: Colin Rhein, MD;  Location: [facility];  Service: Orthopedics;  Laterality: Right;  right ankle arthroscopy with extensive debridement , dridement and drilling talar dome osteochondral lesion   ANKLE FRACTURE SURGERY Right 2009   Dr. Rolena Infante   BREAST BIOPSY  07/2015   CARDIAC CATHETERIZATION     CARPAL TUNNEL RELEASE Bilateral    CATARACT EXTRACTION, BILATERAL     COLONOSCOPY     DILATION AND CURETTAGE OF UTERUS     "before hysterectomy"   FRACTURE SURGERY     KNEE ARTHROSCOPY Bilateral    LAPAROSCOPIC CHOLECYSTECTOMY     MASTECTOMY COMPLETE / SIMPLE W/ SENTINEL NODE BIOPSY Right 08/15/2015   MASTECTOMY W/ SENTINEL NODE BIOPSY Right 08/15/2015   Procedure: TOTAL MASTECTOMY WITH SENTINEL LYMPH NODE BIOPSY AND BLUE DYE INJECTION ;  Surgeon: Fanny Skates, MD;  Location: [facility];  Service: General;  Laterality: Right;   PORT-A-CATH REMOVAL N/A 11/12/2016   Procedure: REMOVAL PORT-A-CATH;  Surgeon: Jackolyn Confer, MD;  Location: [facility];  Service: General;  Laterality: N/A;   PORTACATH PLACEMENT Left 08/15/2015   PORTACATH PLACEMENT Left 08/15/2015   Procedure: INSERTION PORT-A-CATH ;  Surgeon: Fanny Skates, MD;  Location: [facility];  Service: General;  Laterality: Left;   SHOULDER ARTHROSCOPY Bilateral 2009-2011   "bone spurs"   TONSILLECTOMY      FAMILY HISTORY: Family History  Problem Relation Age of Onset   Colon polyps Mother    Dementia Mother    Stroke Mother    Heart Problems Mother    Dementia Father     Alcohol abuse Father    Colon cancer Father        dx. 38 or younger-pt states never confirmed   Breast cancer Sister 56       inflammatory   Diverticulitis Sister        maternal half-sister; severe - causing partial colectomy   Breast cancer Sister        maternal half-sister; dx. early 56s   Alcohol abuse Brother    Colon polyps Brother    Breast cancer Other 6  niece   Breast cancer Other        niece dx. 60s   Esophageal cancer Neg Hx    Stomach cancer Neg Hx    Rectal cancer Neg Hx     SOCIAL HISTORY: Social History   Socioeconomic History   Marital status: Single    Spouse name: Not on file   Number of children: 3   Years of education: Not on file   Highest education level: Not on file  Occupational History   Occupation: retired  Tobacco Use   Smoking status: Never   Smokeless tobacco: Never  Vaping Use   Vaping Use: Never used  Substance and Sexual Activity   Alcohol use: Not Currently    Alcohol/week: 0.0 standard drinks of alcohol    Comment: 08/15/2015 "1/2 glass of wine q 2 months"    Drug use: No   Sexual activity: Not Currently    Comment: 1st intercourse- 14, partners- 48, divorced  Other Topics Concern   Not on file  Social History Narrative   Not on file   Social Determinants of Health   Financial Resource Strain: Low Risk  (09/14/2021)   Overall Financial Resource Strain (CARDIA)    Difficulty of Paying Living Expenses: Not hard at all  Food Insecurity: No Food Insecurity (09/14/2021)   Hunger Vital Sign    Worried About Running Out of Food in the Last Year: Never true    [place] in the Last Year: Never true  Transportation Needs: No Transportation Needs (09/14/2021)   PRAPARE - Hydrologist (Medical): No    Lack of Transportation (Non-Medical): No  Physical Activity: Inactive (09/14/2021)   Exercise Vital Sign    Days of Exercise per Week: 0 days    Minutes of Exercise per Session: 0 min  Stress: No  Stress Concern Present (09/14/2021)   [place]    Feeling of Stress : Not at all  Social Connections: Moderately Integrated (09/14/2021)   Social Connection and Isolation Panel [NHANES]    Frequency of Communication with Friends and Family: Three times a week    Frequency of Social Gatherings with Friends and Family: Three times a week    Attends Religious Services: More than 4 times per year    Active Member of Clubs or Organizations: Yes    Attends Archivist Meetings: 1 to 4 times per year    Marital Status: Divorced  Intimate Partner Violence: Not At Risk (09/14/2021)   Humiliation, Afraid, Rape, and Kick questionnaire    Fear of Current or Ex-Partner: No    Emotionally Abused: No    Physically Abused: No    Sexually Abused: No     PHYSICAL EXAM  GENERAL EXAM/CONSTITUTIONAL: Vitals:  Vitals:   03/08/22 1440  BP: 135/87  Pulse: 68  Weight: 194 lb (88 kg)  Height: '5\' 4"'$  (1.626 m)   Body mass index is 33.3 kg/m. Wt Readings from Last 3 Encounters:  03/08/22 194 lb (88 kg)  01/26/22 189 lb 8 oz (86 kg)  01/22/22 188 lb 6 oz (85.4 kg)   Patient is in no distress; well developed, nourished and groomed; neck is supple  CARDIOVASCULAR: Examination of carotid arteries is normal; no carotid bruits Regular rate and rhythm, no murmurs Examination of peripheral vascular system by observation and palpation is normal  EYES: Ophthalmoscopic exam of optic discs and posterior segments is normal; no papilledema  or hemorrhages No results found.  MUSCULOSKELETAL: Gait, strength, tone, movements noted in Neurologic exam below  NEUROLOGIC: MENTAL STATUS:      No data to display         awake, alert, oriented to person, place and time recent and remote memory intact normal attention and concentration language fluent, comprehension intact, naming intact fund of knowledge appropriate  CRANIAL NERVE:   2nd - no papilledema on fundoscopic exam 2nd, 3rd, 4th, 6th - pupils equal and reactive to light, visual fields full to confrontation, extraocular muscles intact, no nystagmus 5th - facial sensation symmetric 7th - facial strength symmetric 8th - hearing intact 9th - palate elevates symmetrically, uvula midline 11th - shoulder shrug symmetric 12th - tongue protrusion midline  MOTOR:  normal bulk and tone, full strength in the BUE, BLE  SENSORY:  normal and symmetric to light touch, temperature, vibration  COORDINATION:  finger-nose-finger, fine finger movements normal  REFLEXES:  deep tendon reflexes TRACE and symmetric  GAIT/STATION:  narrow based gait     DIAGNOSTIC DATA (LABS, IMAGING, TESTING) - I reviewed patient records, labs, notes, testing and imaging myself where available.  Lab Results  Component Value Date   WBC 9.1 12/27/2021   HGB 13.9 12/27/2021   HCT 43.1 12/27/2021   MCV 93.9 12/27/2021   PLT 271.0 12/27/2021      Component Value Date/Time   NA 143 12/27/2021 0949   NA 147 (H) 06/01/2021 1007   NA 143 11/30/2016 0904   K 5.3 (H) 01/03/2022 0807   K 4.2 11/30/2016 0904   CL 105 12/27/2021 0949   CO2 32 12/27/2021 0949   CO2 25 11/30/2016 0904   GLUCOSE 97 12/27/2021 0949   GLUCOSE 127 11/30/2016 0904   BUN 15 12/27/2021 0949   BUN 15 06/01/2021 1007   BUN 17.3 11/30/2016 0904   CREATININE 0.92 12/27/2021 0949   CREATININE 1.1 11/30/2016 0904   CALCIUM 9.7 12/27/2021 0949   CALCIUM 9.7 11/30/2016 0904   PROT 6.7 12/27/2021 0949   PROT 6.9 06/01/2021 1007   PROT 6.7 11/30/2016 0904   ALBUMIN 3.9 12/27/2021 0949   ALBUMIN 4.4 06/01/2021 1007   ALBUMIN 3.6 11/30/2016 0904   AST 13 12/27/2021 0949   AST 16 11/30/2016 0904   ALT 22 12/27/2021 0949   ALT 21 11/30/2016 0904   ALKPHOS 103 12/27/2021 0949   ALKPHOS 102 11/30/2016 0904   BILITOT 0.5 12/27/2021 0949   BILITOT 0.3 06/01/2021 1007   BILITOT 0.49 11/30/2016 0904   GFRNONAA 51  (L) 10/06/2018 0500   GFRAA 59 (L) 10/06/2018 0500   Lab Results  Component Value Date   CHOL 134 12/27/2021   HDL 54.30 12/27/2021   LDLCALC 64 12/27/2021   TRIG 81.0 12/27/2021   CHOLHDL 2 12/27/2021   Lab Results  Component Value Date   HGBA1C 6.7 (H) 12/27/2021   Lab Results  Component Value Date   VITAMINB12 1,699 (H) 01/13/2016   Lab Results  Component Value Date   TSH 2.98 12/27/2021      ASSESSMENT AND PLAN  76 y.o. year old female here with:  Dx:  1. Hand cramp     PLAN:  INTERMITTENT HAND / FINGER MYOCLONUS --> task specific while using phone, tablet, keyboard, crafting - ddx: task specific dystonia, peripheral neuropathy (patient has history of bilateral carpal tunnel syndrome and surgeries from 1990's; also chemo-neuropathy from 2017), essential tremor, arthritis associated weakness - consider MRI brain, cervical spine (low yield given normal  neuro exam today) - monitor for now; may follow up with me anytime as needed if symptoms worsen  Return for pending if symptoms worsen or fail to improve.    Penni Bombard, MD 88/89/1694, 5:03 PM Certified in Neurology, Neurophysiology and Neuroimaging  [facility] [address] [place],  [address] [phone]

## 2022-03-09 ENCOUNTER — Encounter: Payer: Self-pay | Admitting: Family Medicine

## 2022-03-09 DIAGNOSIS — Z780 Asymptomatic menopausal state: Secondary | ICD-10-CM

## 2022-03-10 ENCOUNTER — Other Ambulatory Visit: Payer: Self-pay | Admitting: Family Medicine

## 2022-03-10 DIAGNOSIS — M797 Fibromyalgia: Secondary | ICD-10-CM

## 2022-03-14 ENCOUNTER — Ambulatory Visit
Admission: RE | Admit: 2022-03-14 | Discharge: 2022-03-14 | Disposition: A | Discharge status: Home / Self Care | Payer: Medicare Other | Source: Ambulatory Visit | Attending: Family Medicine | Admitting: Family Medicine

## 2022-03-14 DIAGNOSIS — Z780 Asymptomatic menopausal state: Secondary | ICD-10-CM

## 2022-03-16 DIAGNOSIS — I10 Essential (primary) hypertension: Secondary | ICD-10-CM | POA: Diagnosis not present

## 2022-03-16 DIAGNOSIS — E785 Hyperlipidemia, unspecified: Secondary | ICD-10-CM | POA: Diagnosis not present

## 2022-03-16 DIAGNOSIS — E039 Hypothyroidism, unspecified: Secondary | ICD-10-CM | POA: Diagnosis not present

## 2022-03-16 DIAGNOSIS — I2510 Atherosclerotic heart disease of native coronary artery without angina pectoris: Secondary | ICD-10-CM | POA: Diagnosis not present

## 2022-03-23 ENCOUNTER — Ambulatory Visit: Payer: Medicare Other | Admitting: Family Medicine

## 2022-03-25 DIAGNOSIS — M5136 Other intervertebral disc degeneration, lumbar region: Secondary | ICD-10-CM | POA: Diagnosis not present

## 2022-03-25 DIAGNOSIS — M25561 Pain in right knee: Secondary | ICD-10-CM | POA: Diagnosis not present

## 2022-03-27 ENCOUNTER — Ambulatory Visit (INDEPENDENT_AMBULATORY_CARE_PROVIDER_SITE_OTHER): Payer: Medicare Other | Admitting: Family Medicine

## 2022-03-27 ENCOUNTER — Encounter: Payer: Self-pay | Admitting: Family Medicine

## 2022-03-27 VITALS — BP 120/80 | HR 72 | Temp 97.7°F | Resp 17 | Ht 64.0 in | Wt 190.5 lb

## 2022-03-27 DIAGNOSIS — E119 Type 2 diabetes mellitus without complications: Secondary | ICD-10-CM

## 2022-03-27 DIAGNOSIS — R109 Unspecified abdominal pain: Secondary | ICD-10-CM

## 2022-03-27 LAB — HEPATIC FUNCTION PANEL
ALT: 15 U/L (ref 0–35)
AST: 18 U/L (ref 0–37)
Albumin: 4.2 g/dL (ref 3.5–5.2)
Alkaline Phosphatase: 99 U/L (ref 39–117)
Bilirubin, Direct: 0.2 mg/dL (ref 0.0–0.3)
Total Bilirubin: 0.9 mg/dL (ref 0.2–1.2)
Total Protein: 7.2 g/dL (ref 6.0–8.3)

## 2022-03-27 LAB — BASIC METABOLIC PANEL
BUN: 15 mg/dL (ref 6–23)
CO2: 30 mEq/L (ref 19–32)
Calcium: 9.8 mg/dL (ref 8.4–10.5)
Chloride: 104 mEq/L (ref 96–112)
Creatinine, Ser: 0.84 mg/dL (ref 0.40–1.20)
GFR: 67.62 mL/min (ref >60.00)
Glucose, Bld: 94 mg/dL (ref 70–99)
Potassium: 4.8 mEq/L (ref 3.5–5.1)
Sodium: 140 mEq/L (ref 135–145)

## 2022-03-27 LAB — CBC WITH DIFFERENTIAL/PLATELET
Basophils Absolute: 0.1 10*3/uL (ref 0.0–0.1)
Basophils Relative: 1.3 % (ref 0.0–3.0)
Eosinophils Absolute: 0.2 10*3/uL (ref 0.0–0.7)
Eosinophils Relative: 3.4 % (ref 0.0–5.0)
HCT: 41.6 % (ref 36.0–46.0)
Hemoglobin: 14 g/dL (ref 12.0–15.0)
Lymphocytes Relative: 36.2 % (ref 12.0–46.0)
Lymphs Abs: 2.2 10*3/uL (ref 0.7–4.0)
MCHC: 33.7 g/dL (ref 30.0–36.0)
MCV: 92.1 fl (ref 78.0–100.0)
Monocytes Absolute: 0.4 10*3/uL (ref 0.1–1.0)
Monocytes Relative: 6.9 % (ref 3.0–12.0)
Neutro Abs: 3.2 10*3/uL (ref 1.4–7.7)
Neutrophils Relative %: 52.2 % (ref 43.0–77.0)
Platelets: 330 10*3/uL (ref 150.0–400.0)
RBC: 4.52 Mil/uL (ref 3.87–5.11)
RDW: 13.9 % (ref 11.5–15.5)
WBC: 6 10*3/uL (ref 4.0–10.5)

## 2022-03-27 LAB — HEMOGLOBIN A1C: Hgb A1c MFr Bld: 6.5 % (ref 4.6–6.5)

## 2022-03-27 NOTE — Patient Instructions (Signed)
Follow up in 3-4 months to recheck sugar, blood pressure, and cholesterol We'll notify you of your lab results and make any changes if needed Try a heating pad for pain relief- this appears to be muscular Tylenol (Acetaminophen) as needed for pain Resume your water and benefiber Call with any questions or concerns Stay Safe!  Stay Healthy! Happy Holidays!!!

## 2022-03-27 NOTE — Progress Notes (Signed)
   Subjective:    Patient ID: Tracey Evans, female    DOB: [birth year], 76 y.o.   MRN: [id number]  HPI DM- chronic problem, currently attempting to control w/ diet.  Last A1C 6.7%.  UTD on microalbumin.  UTD on eye exam.  Due for foot exam today.  No CP, SOB, HA's, visual changes.  No numbness/tingling of hands or feet above baseline.  L sided pain- sxs started last week.  L sided just anterior to mid-axillary line.  TTP.  No N/V.  Pt is going to [place] tonight to spend the holidays w/ her daughter.  Pt admits to poor water intake but denies constipation.  Feels BM's have been normal but since Thanksgiving she has been back and forth to [place] multiple times and has not been taking her benefiber and not eating regularly.  Pain is constant.  Pt reports she has been doing extensive laundry, tearing down boxes, has done a lot of bending and stooping.   Review of Systems For ROS see HPI     Objective:   Physical Exam Vitals reviewed.  Constitutional:      General: She is not in acute distress.    Appearance: Normal appearance. She is well-developed. She is not ill-appearing.  HENT:     Head: Normocephalic and atraumatic.  Eyes:     Conjunctiva/sclera: Conjunctivae normal.     Pupils: Pupils are equal, round, and reactive to light.  Neck:     Thyroid: No thyromegaly.  Cardiovascular:     Rate and Rhythm: Normal rate and regular rhythm.     Heart sounds: Normal heart sounds. No murmur heard. Pulmonary:     Effort: Pulmonary effort is normal. No respiratory distress.     Breath sounds: Normal breath sounds.  Abdominal:     General: There is no distension.     Palpations: Abdomen is soft.     Tenderness: There is abdominal tenderness (L sided, under lower ribs, just anterior to mid-axillary line). There is no guarding or rebound.  Musculoskeletal:     Cervical back: Normal range of motion and neck supple.  Lymphadenopathy:     Cervical: No cervical adenopathy.  Skin:     General: Skin is warm and dry.  Neurological:     Mental Status: She is alert and oriented to person, place, and time.  Psychiatric:        Behavior: Behavior normal.           Assessment & Plan:   L sided abd pain- new.  Pt's sxs are more consistent w/ constipation vs musculoskeletal etiology rather than GI bleed.  Denies blood in stool, no dark or tarry stools.  Denies SOB or dizziness.  Has not been taking her benefiber regularly, not drinking adequate water.  Has been doing a lot of traveling and doing a lot of bending/stooping, laundry, and breaking down moving boxes which is much different than her usual activity level.  Check labs to r/o anemia, bleed, metabolic abnormality.  Reviewed supportive care and red flags that should prompt return.  Pt expressed understanding and is in agreement w/ plan.

## 2022-03-28 ENCOUNTER — Ambulatory Visit: Payer: Medicare Other | Admitting: Family Medicine

## 2022-03-28 ENCOUNTER — Telehealth: Payer: Self-pay

## 2022-03-28 NOTE — Telephone Encounter (Signed)
Pt seen results Via my chart  

## 2022-03-28 NOTE — Telephone Encounter (Signed)
-----   Message from Midge Minium, MD sent at 03/27/2022  9:18 PM EST ----- Labs look great!  No changes at this time

## 2022-04-22 NOTE — Assessment & Plan Note (Signed)
Chronic problem.  Currently attempting to control w/ diet.  Last A1C 6.7%.  Foot exam done today.  UTD on eye exam, microalbumin.  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  Check labs.  Start meds prn.

## 2022-04-24 ENCOUNTER — Telehealth: Payer: Self-pay

## 2022-04-24 NOTE — Telephone Encounter (Signed)
Received forms from [employer] [facility] placed in Dr Birdie Riddle to be signed folder

## 2022-04-26 ENCOUNTER — Other Ambulatory Visit: Payer: Self-pay | Admitting: Family Medicine

## 2022-04-26 DIAGNOSIS — K219 Gastro-esophageal reflux disease without esophagitis: Secondary | ICD-10-CM

## 2022-04-27 ENCOUNTER — Ambulatory Visit (INDEPENDENT_AMBULATORY_CARE_PROVIDER_SITE_OTHER): Payer: Medicare Other | Admitting: Family Medicine

## 2022-04-27 ENCOUNTER — Encounter: Payer: Self-pay | Admitting: Family Medicine

## 2022-04-27 VITALS — BP 126/80 | HR 81 | Temp 97.4°F | Resp 17 | Ht 64.0 in | Wt 191.0 lb

## 2022-04-27 DIAGNOSIS — R5383 Other fatigue: Secondary | ICD-10-CM | POA: Diagnosis not present

## 2022-04-27 DIAGNOSIS — K219 Gastro-esophageal reflux disease without esophagitis: Secondary | ICD-10-CM

## 2022-04-27 DIAGNOSIS — F331 Major depressive disorder, recurrent, moderate: Secondary | ICD-10-CM

## 2022-04-27 LAB — TSH: TSH: 2 u[IU]/mL (ref 0.35–5.50)

## 2022-04-27 LAB — HEPATIC FUNCTION PANEL
ALT: 13 U/L (ref 0–35)
AST: 17 U/L (ref 0–37)
Albumin: 4.3 g/dL (ref 3.5–5.2)
Alkaline Phosphatase: 108 U/L (ref 39–117)
Bilirubin, Direct: 0.1 mg/dL (ref 0.0–0.3)
Total Bilirubin: 0.5 mg/dL (ref 0.2–1.2)
Total Protein: 7.1 g/dL (ref 6.0–8.3)

## 2022-04-27 LAB — CBC WITH DIFFERENTIAL/PLATELET
Basophils Absolute: 0.1 10*3/uL (ref 0.0–0.1)
Basophils Relative: 0.8 % (ref 0.0–3.0)
Eosinophils Absolute: 0.2 10*3/uL (ref 0.0–0.7)
Eosinophils Relative: 3.3 % (ref 0.0–5.0)
HCT: 43.3 % (ref 36.0–46.0)
Hemoglobin: 14.1 g/dL (ref 12.0–15.0)
Lymphocytes Relative: 32.7 % (ref 12.0–46.0)
Lymphs Abs: 2.3 10*3/uL (ref 0.7–4.0)
MCHC: 32.6 g/dL (ref 30.0–36.0)
MCV: 92.9 fl (ref 78.0–100.0)
Monocytes Absolute: 0.6 10*3/uL (ref 0.1–1.0)
Monocytes Relative: 8.3 % (ref 3.0–12.0)
Neutro Abs: 3.9 10*3/uL (ref 1.4–7.7)
Neutrophils Relative %: 54.9 % (ref 43.0–77.0)
Platelets: 290 10*3/uL (ref 150.0–400.0)
RBC: 4.66 Mil/uL (ref 3.87–5.11)
RDW: 14.3 % (ref 11.5–15.5)
WBC: 7.1 10*3/uL (ref 4.0–10.5)

## 2022-04-27 LAB — BASIC METABOLIC PANEL
BUN: 15 mg/dL (ref 6–23)
CO2: 31 mEq/L (ref 19–32)
Calcium: 9.7 mg/dL (ref 8.4–10.5)
Chloride: 106 mEq/L (ref 96–112)
Creatinine, Ser: 1 mg/dL (ref 0.40–1.20)
GFR: 54.82 mL/min — ABNORMAL LOW (ref >60.00)
Glucose, Bld: 102 mg/dL — ABNORMAL HIGH (ref 70–99)
Potassium: 4.9 mEq/L (ref 3.5–5.1)
Sodium: 145 mEq/L (ref 135–145)

## 2022-04-27 MED ORDER — BUPROPION HCL ER (XL) 150 MG PO TB24: 150.0000 mg | ORAL_TABLET | Freq: Every day | ORAL | 3 refills | Status: DC

## 2022-04-27 MED ORDER — OMEPRAZOLE 20 MG PO CPDR: DELAYED_RELEASE_CAPSULE | ORAL | 3 refills | Status: DC

## 2022-04-27 NOTE — Progress Notes (Signed)
   Subjective:    Patient ID: Tracey Evans, female    DOB: 26-Jul-1945, 77 y.o.   MRN: [id number]  HPI Fatigue- 'i don't have any energy'.  Sxs started ~2-3 months ago.  Pt is under considerable stress w/ ill sister and is now financially supporting daughter after she lost her job.  Pt reports she wants to do things, 'but I don't.  I just sit there'.  'i don't want to be back on meds' but is willing to consider this as she knows she is overwhelmed.  Denies CP, SOB above baseline, abd pain, N/V.   Review of Systems For ROS see HPI     Objective:   Physical Exam Vitals reviewed.  Constitutional:      General: She is not in acute distress.    Appearance: Normal appearance. She is well-developed. She is obese. She is not ill-appearing.  HENT:     Head: Normocephalic and atraumatic.  Eyes:     Conjunctiva/sclera: Conjunctivae normal.     Pupils: Pupils are equal, round, and reactive to light.  Neck:     Thyroid: No thyromegaly.  Cardiovascular:     Rate and Rhythm: Normal rate and regular rhythm.     Heart sounds: Normal heart sounds. No murmur heard. Pulmonary:     Effort: Pulmonary effort is normal. No respiratory distress.     Breath sounds: Normal breath sounds.  Abdominal:     General: There is no distension.     Palpations: Abdomen is soft.     Tenderness: There is no abdominal tenderness.  Musculoskeletal:     Cervical back: Normal range of motion and neck supple.  Lymphadenopathy:     Cervical: No cervical adenopathy.  Skin:    General: Skin is warm and dry.  Neurological:     Mental Status: She is alert and oriented to person, place, and time.  Psychiatric:        Behavior: Behavior normal.           Assessment & Plan:  Fatigue- new.  Will check labs to r/o underlying metabolic cause, but I suspect this is mostly mood related.  After a considerable discussion, pt is willing to restart Wellbutrin.  Will follow closely.

## 2022-04-27 NOTE — Telephone Encounter (Signed)
I spoke to the pt and she states she has not requested any knee brace or anything from any medical supply .

## 2022-04-27 NOTE — Patient Instructions (Addendum)
Follow up in 1 month to recheck energy and mood START the Bupropion (Wellbutrin) daily Use the Trazodone at night for sleep Call with any questions or concerns Stay Safe!  Stay Healthy! Hang in there!!!

## 2022-04-27 NOTE — Telephone Encounter (Signed)
Please call pt and verify she wants this prior to faxing.  I don't want this to be a scam

## 2022-04-30 NOTE — Assessment & Plan Note (Signed)
Deteriorated.  Sister is in a nursing home and not doing well and daughter just lost her job.  Pt now finds herself trying to financially support her on a very limited/fixed income.  Admits she is overwhelmed.  After long discussion, she admits that her fatigue and lack of motivation is mood related.  Will restart Wellbutrin '150mg'$  daily.  Pt expressed understanding and is in agreement w/ plan.

## 2022-04-30 NOTE — Assessment & Plan Note (Signed)
Refill provided on Omeprazole

## 2022-05-11 DIAGNOSIS — M170 Bilateral primary osteoarthritis of knee: Secondary | ICD-10-CM | POA: Diagnosis not present

## 2022-05-15 ENCOUNTER — Encounter: Payer: Self-pay | Admitting: Family Medicine

## 2022-05-16 ENCOUNTER — Encounter: Payer: Self-pay | Admitting: Family Medicine

## 2022-05-16 NOTE — Telephone Encounter (Signed)
Pt is asking if dehydration could cause hot flashes compared to the Wellbutrin

## 2022-05-18 ENCOUNTER — Other Ambulatory Visit: Payer: Self-pay | Admitting: Family Medicine

## 2022-05-18 DIAGNOSIS — M797 Fibromyalgia: Secondary | ICD-10-CM

## 2022-05-18 DIAGNOSIS — K219 Gastro-esophageal reflux disease without esophagitis: Secondary | ICD-10-CM

## 2022-05-18 DIAGNOSIS — E039 Hypothyroidism, unspecified: Secondary | ICD-10-CM

## 2022-05-18 MED ORDER — LEVOTHYROXINE SODIUM 75 MCG PO TABS: ORAL_TABLET | ORAL | 0 refills | Status: DC

## 2022-05-18 MED ORDER — OMEPRAZOLE 20 MG PO CPDR: DELAYED_RELEASE_CAPSULE | ORAL | 0 refills | Status: DC

## 2022-05-18 MED ORDER — ATORVASTATIN CALCIUM 20 MG PO TABS: 20.0000 mg | ORAL_TABLET | Freq: Every day | ORAL | 0 refills | Status: DC

## 2022-05-18 MED ORDER — AMLODIPINE BESYLATE 5 MG PO TABS: 5.0000 mg | ORAL_TABLET | Freq: Every day | ORAL | 0 refills | Status: DC

## 2022-05-18 MED ORDER — GABAPENTIN 300 MG PO CAPS: ORAL_CAPSULE | ORAL | 0 refills | Status: DC

## 2022-05-18 NOTE — Telephone Encounter (Addendum)
Encourage patient to contact the pharmacy for refills or they can request refills through [facility]  (Please schedule appointment if patient has not been seen in over a year)  Last ov was 04/17/22  WHAT PHARMACY WOULD THEY LIKE THIS SENT TO:  [facility] [address] [place] [address]  MEDICATION NAME & DOSE:amLODipine (NORVASC) 5 MG tablet, atorvastatin (LIPITOR) 20 MG tablet, gabapentin (NEURONTIN) 300 MG capsule, levothyroxine (SYNTHROID) 75 MCG tablet and omeprazole (PRILOSEC) 20 MG capsule.  NOTES/COMMENTS FROM PATIENT: Patient called stating that the train station lost her lounge. Patient medications was inside the lounge. Patient states that he had two weeks worth of medication inside her luggage. Patient want her medication to be sent to this pharmacy in [place].       [place] office please notify patient: It takes 48-72 hours to process rx refill requests Ask patient to call pharmacy to ensure rx is ready before heading there.

## 2022-05-18 NOTE — Telephone Encounter (Signed)
Pt is asking for meds sent to out of state pharmacy due to lost luggage  Please advise

## 2022-05-19 ENCOUNTER — Encounter: Payer: Self-pay | Admitting: Family Medicine

## 2022-05-19 ENCOUNTER — Other Ambulatory Visit: Payer: Self-pay | Admitting: Family Medicine

## 2022-05-21 NOTE — Telephone Encounter (Signed)
Should we send short script bupropion or is she okay until she returns? Sent message to determine when she returns due to the weekend

## 2022-05-28 ENCOUNTER — Ambulatory Visit: Payer: Medicare Other | Admitting: Family Medicine

## 2022-06-05 ENCOUNTER — Encounter: Payer: Self-pay | Admitting: Family Medicine

## 2022-06-05 ENCOUNTER — Ambulatory Visit (INDEPENDENT_AMBULATORY_CARE_PROVIDER_SITE_OTHER): Payer: Medicare Other | Admitting: Family Medicine

## 2022-06-05 VITALS — BP 112/78 | HR 78 | Temp 98.7°F | Resp 17 | Ht 64.0 in | Wt 188.2 lb

## 2022-06-05 DIAGNOSIS — F331 Major depressive disorder, recurrent, moderate: Secondary | ICD-10-CM

## 2022-06-05 NOTE — Patient Instructions (Signed)
Follow up as scheduled or as needed Switch the Wellbutrin to night to see if this gives you more morning energy You can't fix everything!!!  So don't kill yourself trying!!! Call with any questions or concerns Stay Safe!  Stay Healthy! Happy Spring!!!

## 2022-06-05 NOTE — Progress Notes (Unsigned)
   Subjective:    Patient ID: Tracey Evans, female    DOB: 04/11/1945, 77 y.o.   MRN: [id number]  HPI Depression- at last visit we added Wellbutrin.  Pt reports mood is better than last visit.  Feels medication is working, 'just not fast enough'.  Pt reports energy is better.  Pt has restarted some of her activities.  Feels that it takes at least 4-5 hrs for medication to start working after she takes it in the morning.  Would like to wake up ready to go.   Review of Systems For ROS see HPI     Objective:   Physical Exam Vitals reviewed.  Constitutional:      General: She is not in acute distress.    Appearance: Normal appearance. She is not ill-appearing.  HENT:     Head: Normocephalic and atraumatic.  Skin:    General: Skin is warm and dry.  Neurological:     General: No focal deficit present.     Mental Status: She is alert and oriented to person, place, and time.  Psychiatric:        Mood and Affect: Mood normal.        Behavior: Behavior normal.        Thought Content: Thought content normal.           Assessment & Plan:

## 2022-06-06 NOTE — Assessment & Plan Note (Signed)
Improved w/ addition of Wellbutrin.  Only complaint is that it takes 4-5 hrs for med to start working in the morning after she takes it.  Will move med to evening to see if she wakes w/ more energy and motivation.  Pt expressed understanding and is in agreement w/ plan.

## 2022-06-08 DIAGNOSIS — R109 Unspecified abdominal pain: Secondary | ICD-10-CM | POA: Diagnosis not present

## 2022-06-09 ENCOUNTER — Other Ambulatory Visit: Payer: Self-pay | Admitting: Family Medicine

## 2022-06-10 ENCOUNTER — Other Ambulatory Visit: Payer: Self-pay | Admitting: Family Medicine

## 2022-06-10 DIAGNOSIS — K219 Gastro-esophageal reflux disease without esophagitis: Secondary | ICD-10-CM

## 2022-06-15 DIAGNOSIS — I2510 Atherosclerotic heart disease of native coronary artery without angina pectoris: Secondary | ICD-10-CM | POA: Diagnosis not present

## 2022-06-15 DIAGNOSIS — I10 Essential (primary) hypertension: Secondary | ICD-10-CM | POA: Diagnosis not present

## 2022-06-15 DIAGNOSIS — E039 Hypothyroidism, unspecified: Secondary | ICD-10-CM | POA: Diagnosis not present

## 2022-06-15 DIAGNOSIS — E785 Hyperlipidemia, unspecified: Secondary | ICD-10-CM | POA: Diagnosis not present

## 2022-06-15 DIAGNOSIS — R109 Unspecified abdominal pain: Secondary | ICD-10-CM | POA: Diagnosis not present

## 2022-07-09 ENCOUNTER — Other Ambulatory Visit: Payer: Self-pay | Admitting: Family Medicine

## 2022-07-09 DIAGNOSIS — M797 Fibromyalgia: Secondary | ICD-10-CM

## 2022-07-10 ENCOUNTER — Ambulatory Visit (INDEPENDENT_AMBULATORY_CARE_PROVIDER_SITE_OTHER): Payer: Medicare Other | Admitting: Family Medicine

## 2022-07-10 ENCOUNTER — Encounter: Payer: Self-pay | Admitting: Family Medicine

## 2022-07-10 VITALS — BP 128/80 | HR 77 | Temp 97.9°F | Resp 16 | Ht 64.0 in | Wt 190.0 lb

## 2022-07-10 DIAGNOSIS — I10 Essential (primary) hypertension: Secondary | ICD-10-CM | POA: Diagnosis not present

## 2022-07-10 DIAGNOSIS — E1169 Type 2 diabetes mellitus with other specified complication: Secondary | ICD-10-CM

## 2022-07-10 DIAGNOSIS — E785 Hyperlipidemia, unspecified: Secondary | ICD-10-CM | POA: Diagnosis not present

## 2022-07-10 DIAGNOSIS — E119 Type 2 diabetes mellitus without complications: Secondary | ICD-10-CM

## 2022-07-10 LAB — TSH: TSH: 1.4 u[IU]/mL (ref 0.35–5.50)

## 2022-07-10 LAB — CBC WITH DIFFERENTIAL/PLATELET
Basophils Absolute: 0 10*3/uL (ref 0.0–0.1)
Basophils Relative: 0.6 % (ref 0.0–3.0)
Eosinophils Absolute: 0.3 10*3/uL (ref 0.0–0.7)
Eosinophils Relative: 4.6 % (ref 0.0–5.0)
HCT: 44.7 % (ref 36.0–46.0)
Hemoglobin: 14.6 g/dL (ref 12.0–15.0)
Lymphocytes Relative: 30.7 % (ref 12.0–46.0)
Lymphs Abs: 2.3 10*3/uL (ref 0.7–4.0)
MCHC: 32.7 g/dL (ref 30.0–36.0)
MCV: 92.9 fl (ref 78.0–100.0)
Monocytes Absolute: 0.6 10*3/uL (ref 0.1–1.0)
Monocytes Relative: 7.8 % (ref 3.0–12.0)
Neutro Abs: 4.1 10*3/uL (ref 1.4–7.7)
Neutrophils Relative %: 56.3 % (ref 43.0–77.0)
Platelets: 291 10*3/uL (ref 150.0–400.0)
RBC: 4.81 Mil/uL (ref 3.87–5.11)
RDW: 15.6 % — ABNORMAL HIGH (ref 11.5–15.5)
WBC: 7.3 10*3/uL (ref 4.0–10.5)

## 2022-07-10 LAB — HEMOGLOBIN A1C: Hgb A1c MFr Bld: 6.6 % — ABNORMAL HIGH (ref 4.6–6.5)

## 2022-07-10 LAB — HEPATIC FUNCTION PANEL
ALT: 22 U/L (ref 0–35)
AST: 26 U/L (ref 0–37)
Albumin: 4.3 g/dL (ref 3.5–5.2)
Alkaline Phosphatase: 101 U/L (ref 39–117)
Bilirubin, Direct: 0.1 mg/dL (ref 0.0–0.3)
Total Bilirubin: 0.6 mg/dL (ref 0.2–1.2)
Total Protein: 7 g/dL (ref 6.0–8.3)

## 2022-07-10 LAB — BASIC METABOLIC PANEL
BUN: 14 mg/dL (ref 6–23)
CO2: 28 mEq/L (ref 19–32)
Calcium: 9.9 mg/dL (ref 8.4–10.5)
Chloride: 105 mEq/L (ref 96–112)
Creatinine, Ser: 0.99 mg/dL (ref 0.40–1.20)
GFR: 55.41 mL/min — ABNORMAL LOW (ref >60.00)
Glucose, Bld: 89 mg/dL (ref 70–99)
Potassium: 5 mEq/L (ref 3.5–5.1)
Sodium: 140 mEq/L (ref 135–145)

## 2022-07-10 LAB — LIPID PANEL
Cholesterol: 129 mg/dL (ref 0–200)
HDL: 54 mg/dL (ref >39.00)
LDL Cholesterol: 57 mg/dL (ref 0–99)
NonHDL: 74.55
Total CHOL/HDL Ratio: 2
Triglycerides: 90 mg/dL (ref 0.0–149.0)
VLDL: 18 mg/dL (ref 0.0–40.0)

## 2022-07-10 MED ORDER — GABAPENTIN 300 MG PO CAPS: ORAL_CAPSULE | ORAL | 0 refills | Status: DC

## 2022-07-10 NOTE — Telephone Encounter (Signed)
Gabapentin 300 mg LOV: 06/05/22 Last Refill:05/18/22 Upcoming appt: 07/10/22

## 2022-07-10 NOTE — Assessment & Plan Note (Signed)
Chronic problem.  Currently on Amlodipine 5mg  daily w/ good control.  Asymptomatic.  Will continue to follow.

## 2022-07-10 NOTE — Assessment & Plan Note (Signed)
Chronic problem.  On Lipitor 20mg daily w/o difficulty.  Check labs.  Adjust meds prn  ?

## 2022-07-10 NOTE — Telephone Encounter (Signed)
[facility] [address]

## 2022-07-10 NOTE — Patient Instructions (Signed)
Follow up in 3-4 months to recheck diabetes We'll notify you of your lab results and make any changes if needed Continue to work on healthy diet and regular exercise- you're doing great! Call with any questions or concerns Stay Safe!  Stay Healthy! Happy Spring!!

## 2022-07-10 NOTE — Telephone Encounter (Signed)
Please verify pharmacy w/ pt as she was seen here this morning and is not currently in [place]

## 2022-07-10 NOTE — Progress Notes (Signed)
   Subjective:    Patient ID: Tracey Evans, female    DOB: 12/17/1945, 77 y.o.   MRN: [id number]  HPI DM- chronic problem, currently diet controlled.  Last A1C 6.5%  UTD on foot exam, eye exam, microalbumin.  Denies symptomatic lows.  No change in neuropathy.  HTN- chronic problem, on Amlodipine 5mg  daily w/ good control.  Denies CP, SOB, HA's, visual changes, edema.  Hyperlipidemia- chronic problem, on Lipitor 20mg  daily.  Denies abd pain, N/V.     Review of Systems For ROS see HPI     Objective:   Physical Exam Vitals reviewed.  Constitutional:      General: She is not in acute distress.    Appearance: Normal appearance. She is well-developed. She is obese. She is not ill-appearing.  HENT:     Head: Normocephalic and atraumatic.  Eyes:     Conjunctiva/sclera: Conjunctivae normal.     Pupils: Pupils are equal, round, and reactive to light.  Neck:     Thyroid: No thyromegaly.  Cardiovascular:     Rate and Rhythm: Normal rate and regular rhythm.     Pulses: Normal pulses.     Heart sounds: Normal heart sounds. No murmur heard. Pulmonary:     Effort: Pulmonary effort is normal. No respiratory distress.     Breath sounds: Normal breath sounds.  Abdominal:     General: There is no distension.     Palpations: Abdomen is soft.     Tenderness: There is no abdominal tenderness.  Musculoskeletal:     Cervical back: Normal range of motion and neck supple.     Right lower leg: No edema.     Left lower leg: No edema.  Lymphadenopathy:     Cervical: No cervical adenopathy.  Skin:    General: Skin is warm and dry.  Neurological:     Mental Status: She is alert and oriented to person, place, and time.  Psychiatric:        Behavior: Behavior normal.           Assessment & Plan:

## 2022-07-10 NOTE — Assessment & Plan Note (Signed)
Chronic problem.  Currently diet controlled.  Pt reports she has decreased her cookie and cake intake.  Applauded her efforts.  UTD on foot exam, eye exam, microalbumin.  Check labs and determine if medication is needed.

## 2022-07-11 ENCOUNTER — Telehealth: Payer: Self-pay

## 2022-07-11 NOTE — Telephone Encounter (Signed)
-----   Message from Midge Minium, MD sent at 07/11/2022  7:33 AM EDT ----- Labs look great!  No changes at this time

## 2022-07-11 NOTE — Telephone Encounter (Signed)
Pt seen results Via my chart  

## 2022-08-01 ENCOUNTER — Other Ambulatory Visit: Payer: Self-pay | Admitting: Family Medicine

## 2022-08-01 NOTE — Telephone Encounter (Signed)
Patient is requesting a refill of the following medications: Requested Prescriptions   Pending Prescriptions Disp Refills   meloxicam (MOBIC) 15 MG tablet [Pharmacy Med Name: MELOXICAM 15 MG TABLET] 30 tablet 3    Sig: Take 1 tablet (15 mg total) by mouth daily.    Date of patient request: 08/01/22 Last office visit: 07/10/22 Date of last refill: 02/22/22 Last refill amount: 30

## 2022-08-02 ENCOUNTER — Ambulatory Visit
Admission: EM | Admit: 2022-08-02 | Discharge: 2022-08-02 | Disposition: A | Discharge status: Home / Self Care | Payer: Medicare Other | Attending: Nurse Practitioner | Admitting: Nurse Practitioner

## 2022-08-02 DIAGNOSIS — R051 Acute cough: Secondary | ICD-10-CM | POA: Diagnosis not present

## 2022-08-02 DIAGNOSIS — Z1152 Encounter for screening for COVID-19: Secondary | ICD-10-CM | POA: Diagnosis not present

## 2022-08-02 DIAGNOSIS — J069 Acute upper respiratory infection, unspecified: Secondary | ICD-10-CM | POA: Insufficient documentation

## 2022-08-02 MED ORDER — BENZONATATE 100 MG PO CAPS
100.0000 mg | ORAL_CAPSULE | Freq: Three times a day (TID) | ORAL | 0 refills | Status: DC
Start: 2022-08-02 — End: 2022-09-19

## 2022-08-02 NOTE — ED Triage Notes (Signed)
Pt c/o sore throat and nasal congestion x3days. States sore throat is better but now coughing with chest congestion. States using hot steam vapor with no relief. Denies SOB

## 2022-08-02 NOTE — Discharge Instructions (Signed)
The clinic will contact you with results of the COVID test done today if it is positive Start Tessalon as needed for cough Rest and fluids Use your albuterol inhaler if needed for any wheezing or shortness of breath Follow-up with your PCP if your symptoms do not improve Please go to the emergency room if you develop any worsening symptoms

## 2022-08-02 NOTE — ED Provider Notes (Signed)
[facility]    CSN: [id number] Arrival date & time: 08/02/22  1023      History   Chief Complaint Chief Complaint  Patient presents with   Cough    HPI Tracey Evans is a 77 y.o. female  presents for evaluation of URI symptoms for 3 days. Patient reports associated symptoms of cough congestion. Denies N/V/D, fevers, ear pain, sore throat, body aches, shortness of breath. Patient does have a hx of asthma.  She has an albuterol inhaler but has not needed to use since symptom onset.  No known sick contacts.  Pt has taken nothing OTC for symptoms. Pt has no other concerns at this time.    Cough   Past Medical History:  Diagnosis Date   Angina at rest    "occurs whenever it wants to, but worse during agitation"   Anxiety    Asthma    Binge eating disorder    Breast cancer of lower-outer quadrant of left female breast 07/08/2015   "NEVER HAD LEFT BREAST CANCER" (08/15/2015)   Cancer of right breast 08/15/2015   Chronic bronchitis    Chronic lower back pain    Complication of anesthesia    had bronc spasms during intubation surgery 2009 on foot-need albuterol inhaler or neb tx preop   Costochondritis    Depression    Diverticular disease    11-2020 bleed   Fibromyalgia    GERD (gastroesophageal reflux disease)    Hot flashes    Hyperlipidemia    Hypertension    Hypothyroidism    Osteoarthritis    "all over"   Peptic ulcer disease    Personal history of chemotherapy 2017   Shortness of breath dyspnea    Type II diabetes mellitus    "diet controlled" (08/15/2015)    Patient Active Problem List   Diagnosis Date Noted   Trauma in childhood 12/27/2021   Pruritus 05/23/2021   Other allergic rhinitis 05/23/2021   History of urticaria 05/23/2021   Other adverse food reactions, not elsewhere classified, subsequent encounter 05/23/2021   History of GI diverticular bleed 12/28/2020   Hypomagnesemia 11/02/2020   Lumbar facet arthropathy 04/13/2020   Myalgia  04/13/2020   Personal history of breast cancer 05/07/2019   Recurrent falls 10/14/2018   Hypotension 10/05/2018   Cervical disc disease 05/02/2017   Anemia associated with nutritional deficiency 01/13/2016   Syncope 10/05/2015   Genetic testing 08/21/2015   Family history of breast cancer in female 07/15/2015   Family history of colon cancer 07/15/2015   Breast cancer of lower-outer quadrant of right female breast 07/08/2015   Disturbance in sleep behavior 11/18/2014   Fibromyalgia 10/04/2014   Diverticulitis of colon (without mention of hemorrhage)(562.11) 12/01/2013   Dry mouth 04/29/2013   Dry eye 04/29/2013   Edema 09/04/2012   Depression 09/04/2012   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   OAB (overactive bladder) 04/01/2012   DM w/o complication type II 02/06/2012   Allergy to dogs 04/19/2011   Polyarthralgia 09/18/2010   Obesity 07/25/2010   STRESS INCONTINENCE 04/25/2010   Other asthma 04/11/2010   VERTIGO 04/11/2010   Hypothyroidism 01/19/2010   Hyperlipidemia associated with type 2 diabetes mellitus 01/19/2010   Essential hypertension 01/19/2010   GERD 01/19/2010   Peptic ulcer 01/19/2010   OSTEOARTHRITIS 01/19/2010   LOW BACK PAIN 01/19/2010    Past Surgical History:  Procedure Laterality Date   ABDOMINAL HYSTERECTOMY     "left my ovaries"   ANKLE ARTHROSCOPY  01/02/2012  ProceduSherri RadRTHROSCOPY;  Surgeon: Paul A Bednarz, MD;  Location: [facility];  Service: Orthopedics;  Laterality: Right;  right ankle arthroscopy with extensive debridement , dridement and drilling talar dome osteochondral lesion   ANKLE FRACTURE SURGERY Right 2009   Dr. Shon Baton   BREAST BIOPSY  07/2015   CARDIAC CATHETERIZATION     CARPAL TUNNEL RELEASE Bilateral    CATARACT EXTRACTION, BILATERAL     COLONOSCOPY     DILATION AND CURETTAGE OF UTERUS     "before hysterectomy"   FRACTURE SURGERY     KNEE ARTHROSCOPY Bilateral    LAPAROSCOPIC  CHOLECYSTECTOMY     MASTECTOMY COMPLETE / SIMPLE W/ SENTINEL NODE BIOPSY Right 08/15/2015   MASTECTOMY W/ SENTINEL NODE BIOPSY Right 08/15/2015   Procedure: TOTAL MASTECTOMY WITH SENTINEL LYMPH NODE BIOPSY AND BLUE DYE INJECTION ;  Surgeon: Claud Kelp, MD;  Location: [facility];  Service: General;  Laterality: Right;   PORT-A-CATH REMOVAL N/A 11/12/2016   Procedure: REMOVAL PORT-A-CATH;  Surgeon: Avel Peace, MD;  Location: [facility];  Service: General;  Laterality: N/A;   PORTACATH PLACEMENT Left 08/15/2015   PORTACATH PLACEMENT Left 08/15/2015   Procedure: INSERTION PORT-A-CATH ;  Surgeon: Claud Kelp, MD;  Location: [facility];  Service: General;  Laterality: Left;   SHOULDER ARTHROSCOPY Bilateral 2009-2011   "bone spurs"   TONSILLECTOMY      OB History     Gravida  2   Para  2   Term      Preterm      AB      Living  3      SAB      IAB      Ectopic      Multiple  1   Live Births               Home Medications    Prior to Admission medications   Medication Sig Start Date End Date Taking? Authorizing Provider  benzonatate (TESSALON) 100 MG capsule Take 1 capsule (100 mg total) by mouth every 8 (eight) hours. 08/02/22  Yes Radford Pax, NP  albuterol (VENTOLIN HFA) 108 (90 Base) MCG/ACT inhaler Inhale 2 puffs into the lungs every 6 (six) hours as needed for wheezing or shortness of breath. 02/27/21   Shade Flood, MD  amLODipine (NORVASC) 5 MG tablet Take 1 tablet (5 mg total) by mouth daily. 05/18/22   Sheliah Hatch, MD  aspirin 81 MG tablet Take 81 mg by mouth daily.    [provider]  atorvastatin (LIPITOR) 20 MG tablet TAKE 1 TABLET BY MOUTH EVERY DAY 06/11/22   Sheliah Hatch, MD  buPROPion (WELLBUTRIN XL) 150 MG 24 hr tablet TAKE 1 TABLET BY MOUTH EVERY DAY 05/21/22   Sheliah Hatch, MD  gabapentin (NEURONTIN) 300 MG capsule TAKE 1 CAPSULE IN THE MORNING , 1 IN AFTERNOON AND 2 CAPSULES BEFORE BED 07/10/22   Sheliah Hatch, MD  Glycerin-Polysorbate 80 (REFRESH DRY EYE THERAPY OP) Place 1 drop into both eyes 3 (three) times daily.    [provider]  levothyroxine (SYNTHROID) 75 MCG tablet TAKE 1 TABLET BY MOUTH EVERY DAY BEFORE BREAKFAST 05/18/22   Sheliah Hatch, MD  Magnesium 400 MG CAPS Take by mouth 2 (two) times daily.    [provider]  meloxicam (MOBIC) 15 MG tablet TAKE 1 TABLET (15 MG TOTAL) BY MOUTH DAILY. 08/01/22   Sheliah Hatch, MD  omeprazole (PRILOSEC)  20 MG capsule TAKE 1 CAPSULE BY MOUTH EVERY DAY 06/11/22   Sheliah Hatch, MD  traZODone (DESYREL) 50 MG tablet Take 50 mg by mouth at bedtime. 03/25/22   [provider]  Wheat Dextrin [facility]) POWD Use as directed, daily 05/31/21   Doree Albee, PA-C    Family History Family History  Problem Relation Age of Onset   Colon polyps Mother    Dementia Mother    Stroke Mother    Heart Problems Mother    Dementia Father    Alcohol abuse Father    Colon cancer Father        dx. 74 or younger-pt states never confirmed   Breast cancer Sister 61       inflammatory   Diverticulitis Sister        maternal half-sister; severe - causing partial colectomy   Breast cancer Sister        maternal half-sister; dx. early 77s   Alcohol abuse Brother    Colon polyps Brother    Breast cancer Other 67       niece   Breast cancer Other        niece dx. 50s   Esophageal cancer Neg Hx    Stomach cancer Neg Hx    Rectal cancer Neg Hx     Social History Social History   Tobacco Use   Smoking status: Never   Smokeless tobacco: Never  Vaping Use   Vaping Use: Never used  Substance Use Topics   Alcohol use: Not Currently    Alcohol/week: 0.0 standard drinks of alcohol    Comment: 08/15/2015 "1/2 glass of wine q 2 months"    Drug use: No     Allergies   Contrast media [iodinated contrast media], Dilaudid [hydromorphone hcl], Adhesive [tape], Codeine, Cymbalta [duloxetine hcl], Lactose  intolerance (gi), Latex, Nsaids, and Lyrica [pregabalin]   Review of Systems Review of Systems  HENT:  Positive for congestion.   Respiratory:  Positive for cough.      Physical Exam Triage Vital Signs ED Triage Vitals [08/02/22 1038]  Enc Vitals Group     BP (!) 146/82     Pulse Rate 81     Resp 18     Temp 98.7 F (37.1 C)     Temp Source Oral     SpO2 96 %     Weight      Height      Head Circumference      Peak Flow      Pain Score 8     Pain Loc      Pain Edu?      Excl. in GC?    No data found.  Updated Vital Signs BP (!) 146/82 (BP Location: Left Arm)   Pulse 81   Temp 98.7 F (37.1 C) (Oral)   Resp 18   SpO2 96%   Visual Acuity Right Eye Distance:   Left Eye Distance:   Bilateral Distance:    Right Eye Near:   Left Eye Near:    Bilateral Near:     Physical Exam Vitals and nursing note reviewed.  Constitutional:      General: She is not in acute distress.    Appearance: She is well-developed. She is not ill-appearing.  HENT:     Head: Normocephalic and atraumatic.     Right Ear: Tympanic membrane and ear canal normal.     Left Ear: Tympanic membrane and ear canal normal.  Nose: Congestion present.     Right Sinus: No maxillary sinus tenderness or frontal sinus tenderness.     Left Sinus: No maxillary sinus tenderness or frontal sinus tenderness.     Mouth/Throat:     Mouth: Mucous membranes are moist.     Pharynx: Oropharynx is clear. Uvula midline. No oropharyngeal exudate or posterior oropharyngeal erythema.     Tonsils: No tonsillar exudate or tonsillar abscesses.  Eyes:     Conjunctiva/sclera: Conjunctivae normal.     Pupils: Pupils are equal, round, and reactive to light.  Cardiovascular:     Rate and Rhythm: Normal rate and regular rhythm.     Heart sounds: Normal heart sounds.  Pulmonary:     Effort: Pulmonary effort is normal.     Breath sounds: Normal breath sounds.  Musculoskeletal:     Cervical back: Normal range of  motion and neck supple.  Lymphadenopathy:     Cervical: No cervical adenopathy.  Skin:    General: Skin is warm and dry.  Neurological:     General: No focal deficit present.     Mental Status: She is alert and oriented to person, place, and time.  Psychiatric:        Mood and Affect: Mood normal.        Behavior: Behavior normal.      UC Treatments / Results  Labs (all labs ordered are listed, but only abnormal results are displayed) Labs Reviewed  SARS CORONAVIRUS 2 (TAT 6-24 HRS)   Recent Results (from the past 2160 hour(s))  Hemoglobin A1c     Status: Abnormal   Collection Time: 07/10/22  9:34 AM  Result Value Ref Range   Hgb A1c MFr Bld 6.6 (H) 4.6 - 6.5 %    Comment: Glycemic Control Guidelines for People with Diabetes:Non Diabetic:  <6%Goal of Therapy: <7%Additional Action Suggested:  >8%   Lipid panel     Status: None   Collection Time: 07/10/22  9:34 AM  Result Value Ref Range   Cholesterol 129 0 - 200 mg/dL    Comment: ATP III Classification       Desirable:  < 200 mg/dL               Borderline High:  200 - 239 mg/dL          High:  > = 696 mg/dL   Triglycerides 29.5 0.0 - 149.0 mg/dL    Comment: Normal:  <284 mg/dLBorderline High:  150 - 199 mg/dL   HDL 13.24 >40.10 mg/dL   VLDL 27.2 0.0 - 53.6 mg/dL   LDL Cholesterol 57 0 - 99 mg/dL   Total CHOL/HDL Ratio 2     Comment:                Men          Women1/2 Average Risk     3.4          3.3Average Risk          5.0          4.42X Average Risk          9.6          7.13X Average Risk          15.0          11.0                       NonHDL 74.55     Comment: NOTE:  Non-HDL goal should be 30 mg/dL higher than patient's LDL goal (i.e. LDL goal of < 70 mg/dL, would have non-HDL goal of < 100 mg/dL)  Basic metabolic panel     Status: Abnormal   Collection Time: 07/10/22  9:34 AM  Result Value Ref Range   Sodium 140 135 - 145 mEq/L   Potassium 5.0 3.5 - 5.1 mEq/L   Chloride 105 96 - 112 mEq/L   CO2 28 19 - 32  mEq/L   Glucose, Bld 89 70 - 99 mg/dL   BUN 14 6 - 23 mg/dL   Creatinine, Ser 8.11 0.40 - 1.20 mg/dL   GFR 91.47 (L) >82.95 mL/min    Comment: Calculated using the CKD-EPI Creatinine Equation (2021)   Calcium 9.9 8.4 - 10.5 mg/dL  TSH     Status: None   Collection Time: 07/10/22  9:34 AM  Result Value Ref Range   TSH 1.40 0.35 - 5.50 uIU/mL  Hepatic function panel     Status: None   Collection Time: 07/10/22  9:34 AM  Result Value Ref Range   Total Bilirubin 0.6 0.2 - 1.2 mg/dL   Bilirubin, Direct 0.1 0.0 - 0.3 mg/dL   Alkaline Phosphatase 101 39 - 117 U/L   AST 26 0 - 37 U/L   ALT 22 0 - 35 U/L   Total Protein 7.0 6.0 - 8.3 g/dL   Albumin 4.3 3.5 - 5.2 g/dL  CBC with Differential/Platelet     Status: Abnormal   Collection Time: 07/10/22  9:34 AM  Result Value Ref Range   WBC 7.3 4.0 - 10.5 K/uL   RBC 4.81 3.87 - 5.11 Mil/uL   Hemoglobin 14.6 12.0 - 15.0 g/dL   HCT 62.1 30.8 - 65.7 %   MCV 92.9 78.0 - 100.0 fl   MCHC 32.7 30.0 - 36.0 g/dL   RDW 84.6 (H) 96.2 - 95.2 %   Platelets 291.0 150.0 - 400.0 K/uL   Neutrophils Relative % 56.3 43.0 - 77.0 %   Lymphocytes Relative 30.7 12.0 - 46.0 %   Monocytes Relative 7.8 3.0 - 12.0 %   Eosinophils Relative 4.6 0.0 - 5.0 %   Basophils Relative 0.6 0.0 - 3.0 %   Neutro Abs 4.1 1.4 - 7.7 K/uL   Lymphs Abs 2.3 0.7 - 4.0 K/uL   Monocytes Absolute 0.6 0.1 - 1.0 K/uL   Eosinophils Absolute 0.3 0.0 - 0.7 K/uL   Basophils Absolute 0.0 0.0 - 0.1 K/uL    EKG   Radiology No results found.  Procedures Procedures (including critical care time)  Medications Ordered in UC Medications - No data to display  Initial Impression / Assessment and Plan / UC Course  I have reviewed the triage vital signs and the nursing notes.  Pertinent labs & imaging results that were available during my care of the patient were reviewed by me and considered in my medical decision making (see chart for details).     Reviewed exam and symptoms with  patient.  No red flags.  Reviewed recent lab work as well as recent office visit with PCP on 4/2.  COVID PCR and will contact if positive Discussed viral illness and symptomatic treatment Tessalon as needed for cough Rest and fluids PCP follow-up if symptoms do not improve ER precautions reviewed and patient verbalized understanding Final Clinical Impressions(s) / UC Diagnoses   Final diagnoses:  Acute cough  Viral upper respiratory illness     Discharge Instructions  The clinic will contact you with results of the COVID test done today if it is positive Start Tessalon as needed for cough Rest and fluids Use your albuterol inhaler if needed for any wheezing or shortness of breath Follow-up with your PCP if your symptoms do not improve Please go to the emergency room if you develop any worsening symptoms   ED Prescriptions     Medication Sig Dispense Auth. Provider   benzonatate (TESSALON) 100 MG capsule Take 1 capsule (100 mg total) by mouth every 8 (eight) hours. 21 capsule Radford Pax, NP      PDMP not reviewed this encounter.   Radford Pax, NP 08/02/22 1052

## 2022-08-03 LAB — SARS CORONAVIRUS 2 (TAT 6-24 HRS): SARS Coronavirus 2: NEGATIVE

## 2022-08-06 ENCOUNTER — Telehealth: Payer: Self-pay | Admitting: Family Medicine

## 2022-08-07 ENCOUNTER — Encounter: Payer: Self-pay | Admitting: Family Medicine

## 2022-08-07 ENCOUNTER — Ambulatory Visit (INDEPENDENT_AMBULATORY_CARE_PROVIDER_SITE_OTHER): Payer: Medicare Other | Admitting: Family Medicine

## 2022-08-07 VITALS — BP 124/74 | HR 86 | Temp 97.8°F | Resp 17 | Ht 64.0 in | Wt 190.0 lb

## 2022-08-07 DIAGNOSIS — J40 Bronchitis, not specified as acute or chronic: Secondary | ICD-10-CM

## 2022-08-07 MED ORDER — CETIRIZINE HCL 10 MG PO TABS
10.0000 mg | ORAL_TABLET | Freq: Every day | ORAL | 3 refills | Status: DC
Start: 2022-08-07 — End: 2022-09-19

## 2022-08-07 MED ORDER — AZITHROMYCIN 250 MG PO TABS
ORAL_TABLET | ORAL | 0 refills | Status: DC
Start: 2022-08-07 — End: 2022-09-19

## 2022-08-07 NOTE — Progress Notes (Signed)
   Subjective:    Patient ID: Tracey Evans, female    DOB: 1945-06-21, 77 y.o.   MRN: [id number]  HPI Cough- sxs started 'over a week ago'.  Went to UC and dx'd w/ URI- started on Tessalon which is helping to break it up but not resolving it.  Cough is now productive of green sputum.  Has nasal congestion and 'it burns'.  No fever.  No ear pain.  Denies SOB or wheezing.  Pt feels sxs are worsening rather than improving.  + hx of DM.     Review of Systems For ROS see HPI     Objective:   Physical Exam Vitals reviewed.  Constitutional:      General: She is not in acute distress.    Appearance: Normal appearance. She is well-developed. She is not ill-appearing.  HENT:     Head: Normocephalic and atraumatic.     Right Ear: Tympanic membrane and ear canal normal.     Left Ear: Tympanic membrane and ear canal normal.     Nose: Congestion present. No rhinorrhea.     Comments: No TTP over frontal or maxillary sinuses    Mouth/Throat:     Pharynx: No oropharyngeal exudate or posterior oropharyngeal erythema.     Comments: Copious PND Eyes:     Conjunctiva/sclera: Conjunctivae normal.     Pupils: Pupils are equal, round, and reactive to light.  Cardiovascular:     Rate and Rhythm: Normal rate and regular rhythm.     Heart sounds: Normal heart sounds. No murmur heard. Pulmonary:     Effort: Pulmonary effort is normal. No respiratory distress.     Breath sounds: Normal breath sounds. No wheezing.     Comments: Wet, hacking cough Musculoskeletal:     Cervical back: Normal range of motion and neck supple.  Lymphadenopathy:     Cervical: No cervical adenopathy.  Skin:    General: Skin is warm and dry.  Neurological:     General: No focal deficit present.     Mental Status: She is alert and oriented to person, place, and time.  Psychiatric:        Mood and Affect: Mood normal.        Behavior: Behavior normal.        Thought Content: Thought content normal.            Assessment & Plan:  Bronchitis- new.  Given pt's age and underlying diabetes, along w/ the fact that her sxs are worsening after 10 days, will tx w/ Azithromycin.  Reviewed supportive care and red flags that should prompt return.  Pt expressed understanding and is in agreement w/ plan.

## 2022-08-07 NOTE — Patient Instructions (Signed)
Follow up as needed or as scheduled START the Azithromycin as directed- take w/ food START Cetirizine daily for any possible allergy component ADD Delsym (or store brand equivalent) to the Tessalon Drink LOTS of fluids REST! Call with any questions or concerns Hang in there!!!

## 2022-09-10 ENCOUNTER — Other Ambulatory Visit: Payer: Self-pay | Admitting: Family Medicine

## 2022-09-10 DIAGNOSIS — K219 Gastro-esophageal reflux disease without esophagitis: Secondary | ICD-10-CM

## 2022-09-10 DIAGNOSIS — M797 Fibromyalgia: Secondary | ICD-10-CM

## 2022-09-10 NOTE — Telephone Encounter (Signed)
Pt aware of lab results 

## 2022-09-10 NOTE — Telephone Encounter (Signed)
Gabapentin 300 mg LOV: 08/07/22 Last Refill:07/10/22 Upcoming appt: 10/09/22

## 2022-09-14 DIAGNOSIS — I10 Essential (primary) hypertension: Secondary | ICD-10-CM | POA: Diagnosis not present

## 2022-09-14 DIAGNOSIS — I2510 Atherosclerotic heart disease of native coronary artery without angina pectoris: Secondary | ICD-10-CM | POA: Diagnosis not present

## 2022-09-14 DIAGNOSIS — E039 Hypothyroidism, unspecified: Secondary | ICD-10-CM | POA: Diagnosis not present

## 2022-09-14 DIAGNOSIS — E782 Mixed hyperlipidemia: Secondary | ICD-10-CM | POA: Diagnosis not present

## 2022-09-18 ENCOUNTER — Encounter: Payer: Self-pay | Admitting: Family Medicine

## 2022-09-19 ENCOUNTER — Encounter: Payer: Self-pay | Admitting: Family Medicine

## 2022-09-19 ENCOUNTER — Ambulatory Visit (INDEPENDENT_AMBULATORY_CARE_PROVIDER_SITE_OTHER): Payer: Medicare Other | Admitting: Family Medicine

## 2022-09-19 VITALS — BP 118/78 | HR 72 | Temp 97.9°F | Resp 17 | Ht 64.0 in | Wt 188.4 lb

## 2022-09-19 DIAGNOSIS — M797 Fibromyalgia: Secondary | ICD-10-CM | POA: Diagnosis not present

## 2022-09-19 DIAGNOSIS — R232 Flushing: Secondary | ICD-10-CM

## 2022-09-19 DIAGNOSIS — R61 Generalized hyperhidrosis: Secondary | ICD-10-CM | POA: Diagnosis not present

## 2022-09-19 LAB — CBC WITH DIFFERENTIAL/PLATELET
Basophils Absolute: 0 10*3/uL (ref 0.0–0.1)
Basophils Relative: 0.6 % (ref 0.0–3.0)
Eosinophils Absolute: 0.2 10*3/uL (ref 0.0–0.7)
Eosinophils Relative: 3.8 % (ref 0.0–5.0)
HCT: 43.5 % (ref 36.0–46.0)
Hemoglobin: 13.9 g/dL (ref 12.0–15.0)
Lymphocytes Relative: 34 % (ref 12.0–46.0)
Lymphs Abs: 1.9 10*3/uL (ref 0.7–4.0)
MCHC: 32 g/dL (ref 30.0–36.0)
MCV: 94.2 fl (ref 78.0–100.0)
Monocytes Absolute: 0.4 10*3/uL (ref 0.1–1.0)
Monocytes Relative: 6.6 % (ref 3.0–12.0)
Neutro Abs: 3.1 10*3/uL (ref 1.4–7.7)
Neutrophils Relative %: 55 % (ref 43.0–77.0)
Platelets: 274 10*3/uL (ref 150.0–400.0)
RBC: 4.62 Mil/uL (ref 3.87–5.11)
RDW: 14.2 % (ref 11.5–15.5)
WBC: 5.7 10*3/uL (ref 4.0–10.5)

## 2022-09-19 LAB — HEPATIC FUNCTION PANEL
ALT: 22 U/L (ref 0–35)
AST: 24 U/L (ref 0–37)
Albumin: 4.2 g/dL (ref 3.5–5.2)
Alkaline Phosphatase: 96 U/L (ref 39–117)
Bilirubin, Direct: 0.2 mg/dL (ref 0.0–0.3)
Total Bilirubin: 0.7 mg/dL (ref 0.2–1.2)
Total Protein: 6.9 g/dL (ref 6.0–8.3)

## 2022-09-19 LAB — BASIC METABOLIC PANEL
BUN: 15 mg/dL (ref 6–23)
CO2: 29 mEq/L (ref 19–32)
Calcium: 9.5 mg/dL (ref 8.4–10.5)
Chloride: 106 mEq/L (ref 96–112)
Creatinine, Ser: 0.94 mg/dL (ref 0.40–1.20)
GFR: 58.88 mL/min — ABNORMAL LOW (ref >60.00)
Glucose, Bld: 110 mg/dL — ABNORMAL HIGH (ref 70–99)
Potassium: 4.7 mEq/L (ref 3.5–5.1)
Sodium: 141 mEq/L (ref 135–145)

## 2022-09-19 LAB — TSH: TSH: 1.08 u[IU]/mL (ref 0.35–5.50)

## 2022-09-19 MED ORDER — GABAPENTIN 300 MG PO CAPS
ORAL_CAPSULE | ORAL | 3 refills | Status: DC
Start: 2022-09-19 — End: 2023-04-16

## 2022-09-19 NOTE — Patient Instructions (Signed)
Follow up as needed or as scheduled We'll notify you of your lab results and make any changes if needed Go back to taking the Gabapentin 4x/day- I sent in a new prescription Wear your shortie pajamas Monitor your hot flashes- try and keep a log of when they occur and how long they last Call with any questions or concerns Hang in there!!

## 2022-09-19 NOTE — Progress Notes (Signed)
   Subjective:    Patient ID: Tracey Evans, female    DOB: [birth year], 77 y.o.   MRN: [id number]  HPI Hot flashes- pt reports waking up 'soaking wet'.  Pt reports sxs will occur 'all day'.  No particular trigger.  Last A1C 6.6% and TSH 1.4  Hot flash will just come like a wave.  Pt has been skipping doses of gabapentin due to decreased quantity.  Sxs started 2-3 weeks ago.  No unexplained weight loss.  No chronic cough.  No urinary symptoms.  Pt notes sweats are worse when wearing synthetic fabrics.  Does 'fine' when wearing 'shortie pajamas'.     Review of Systems For ROS see HPI     Objective:   Physical Exam Vitals reviewed.  Constitutional:      General: She is not in acute distress.    Appearance: Normal appearance. She is well-developed. She is not ill-appearing.  HENT:     Head: Normocephalic and atraumatic.  Eyes:     Conjunctiva/sclera: Conjunctivae normal.     Pupils: Pupils are equal, round, and reactive to light.  Neck:     Thyroid: No thyromegaly.  Cardiovascular:     Rate and Rhythm: Normal rate and regular rhythm.     Pulses: Normal pulses.     Heart sounds: Normal heart sounds. No murmur heard. Pulmonary:     Effort: Pulmonary effort is normal. No respiratory distress.     Breath sounds: Normal breath sounds.  Abdominal:     General: There is no distension.     Palpations: Abdomen is soft.     Tenderness: There is no abdominal tenderness.  Musculoskeletal:     Cervical back: Normal range of motion and neck supple.     Right lower leg: No edema.     Left lower leg: No edema.  Lymphadenopathy:     Cervical: No cervical adenopathy.  Skin:    General: Skin is warm and dry.  Neurological:     General: No focal deficit present.     Mental Status: She is alert and oriented to person, place, and time.  Psychiatric:        Mood and Affect: Mood normal.        Behavior: Behavior normal.        Thought Content: Thought content normal.            Assessment & Plan:  Night sweats/hot flashes- new.  Pt reports this started 2-3 weeks ago.  Reviewed her med list with her as there are multiple things that could cause her sxs.  Amlodipine can cause flushing and sweats but she has been on this for years.  Withdrawal from Wellbutrin and Gabapentin can also cause sweats.  Pt reports the quantity on her Gabapentin has not corresponded w/ the directions so she has been skipping doses to make it last.  This is the most likely cause of her sxs.  Will also check labs to assess for metabolic cause.  Talked about the possibility of hypoglycemia and encouraged her to eat regularly.  Less likely would be malignant or infectious process but cannot entirely rule those out.  Gabapentin quantity adjusted and she will try and log her sxs.  Pt expressed understanding and is in agreement w/ plan.

## 2022-09-20 ENCOUNTER — Telehealth: Payer: Self-pay

## 2022-09-20 NOTE — Telephone Encounter (Signed)
-----   Message from Sheliah Hatch, MD sent at 09/20/2022  7:17 AM EDT ----- Labs look great!  No obvious cause of hot flashes based on results.

## 2022-09-26 DIAGNOSIS — M170 Bilateral primary osteoarthritis of knee: Secondary | ICD-10-CM | POA: Diagnosis not present

## 2022-09-29 ENCOUNTER — Encounter (HOSPITAL_COMMUNITY): Payer: Self-pay | Admitting: Internal Medicine

## 2022-09-29 ENCOUNTER — Other Ambulatory Visit: Payer: Self-pay

## 2022-09-29 ENCOUNTER — Emergency Department (HOSPITAL_COMMUNITY): Payer: Medicare Other

## 2022-09-29 ENCOUNTER — Inpatient Hospital Stay (HOSPITAL_COMMUNITY)
Admission: EM | Admit: 2022-09-29 | Discharge: 2022-10-03 | DRG: 392 | Disposition: A | Discharge status: Home / Self Care | Payer: Medicare Other | Attending: Student | Admitting: Student

## 2022-09-29 DIAGNOSIS — M797 Fibromyalgia: Secondary | ICD-10-CM | POA: Diagnosis present

## 2022-09-29 DIAGNOSIS — E739 Lactose intolerance, unspecified: Secondary | ICD-10-CM | POA: Diagnosis present

## 2022-09-29 DIAGNOSIS — J4489 Other specified chronic obstructive pulmonary disease: Secondary | ICD-10-CM | POA: Diagnosis present

## 2022-09-29 DIAGNOSIS — Z9011 Acquired absence of right breast and nipple: Secondary | ICD-10-CM | POA: Diagnosis not present

## 2022-09-29 DIAGNOSIS — F32A Depression, unspecified: Secondary | ICD-10-CM | POA: Diagnosis present

## 2022-09-29 DIAGNOSIS — I10 Essential (primary) hypertension: Secondary | ICD-10-CM | POA: Diagnosis present

## 2022-09-29 DIAGNOSIS — K5792 Diverticulitis of intestine, part unspecified, without perforation or abscess without bleeding: Secondary | ICD-10-CM | POA: Diagnosis present

## 2022-09-29 DIAGNOSIS — Z91040 Latex allergy status: Secondary | ICD-10-CM

## 2022-09-29 DIAGNOSIS — R109 Unspecified abdominal pain: Secondary | ICD-10-CM | POA: Diagnosis not present

## 2022-09-29 DIAGNOSIS — F411 Generalized anxiety disorder: Secondary | ICD-10-CM | POA: Diagnosis present

## 2022-09-29 DIAGNOSIS — Z853 Personal history of malignant neoplasm of breast: Secondary | ICD-10-CM

## 2022-09-29 DIAGNOSIS — E669 Obesity, unspecified: Secondary | ICD-10-CM | POA: Diagnosis present

## 2022-09-29 DIAGNOSIS — K5712 Diverticulitis of small intestine without perforation or abscess without bleeding: Secondary | ICD-10-CM | POA: Diagnosis not present

## 2022-09-29 DIAGNOSIS — K5700 Diverticulitis of small intestine with perforation and abscess without bleeding: Principal | ICD-10-CM | POA: Diagnosis present

## 2022-09-29 DIAGNOSIS — E039 Hypothyroidism, unspecified: Secondary | ICD-10-CM | POA: Diagnosis present

## 2022-09-29 DIAGNOSIS — Z8711 Personal history of peptic ulcer disease: Secondary | ICD-10-CM | POA: Diagnosis not present

## 2022-09-29 DIAGNOSIS — N281 Cyst of kidney, acquired: Secondary | ICD-10-CM | POA: Diagnosis not present

## 2022-09-29 DIAGNOSIS — Z79899 Other long term (current) drug therapy: Secondary | ICD-10-CM | POA: Diagnosis not present

## 2022-09-29 DIAGNOSIS — K219 Gastro-esophageal reflux disease without esophagitis: Secondary | ICD-10-CM | POA: Diagnosis present

## 2022-09-29 DIAGNOSIS — Z79890 Hormone replacement therapy (postmenopausal): Secondary | ICD-10-CM | POA: Diagnosis not present

## 2022-09-29 DIAGNOSIS — I2510 Atherosclerotic heart disease of native coronary artery without angina pectoris: Secondary | ICD-10-CM | POA: Diagnosis present

## 2022-09-29 DIAGNOSIS — Z90710 Acquired absence of both cervix and uterus: Secondary | ICD-10-CM | POA: Diagnosis not present

## 2022-09-29 DIAGNOSIS — Z6832 Body mass index (BMI) 32.0-32.9, adult: Secondary | ICD-10-CM

## 2022-09-29 DIAGNOSIS — Z803 Family history of malignant neoplasm of breast: Secondary | ICD-10-CM

## 2022-09-29 DIAGNOSIS — Z9221 Personal history of antineoplastic chemotherapy: Secondary | ICD-10-CM | POA: Diagnosis not present

## 2022-09-29 DIAGNOSIS — E860 Dehydration: Secondary | ICD-10-CM | POA: Diagnosis present

## 2022-09-29 DIAGNOSIS — E119 Type 2 diabetes mellitus without complications: Secondary | ICD-10-CM | POA: Diagnosis present

## 2022-09-29 DIAGNOSIS — Z7982 Long term (current) use of aspirin: Secondary | ICD-10-CM

## 2022-09-29 DIAGNOSIS — E785 Hyperlipidemia, unspecified: Secondary | ICD-10-CM | POA: Diagnosis present

## 2022-09-29 DIAGNOSIS — Z791 Long term (current) use of non-steroidal anti-inflammatories (NSAID): Secondary | ICD-10-CM

## 2022-09-29 DIAGNOSIS — Z9842 Cataract extraction status, left eye: Secondary | ICD-10-CM

## 2022-09-29 DIAGNOSIS — Z91041 Radiographic dye allergy status: Secondary | ICD-10-CM | POA: Diagnosis not present

## 2022-09-29 DIAGNOSIS — Z888 Allergy status to other drugs, medicaments and biological substances status: Secondary | ICD-10-CM

## 2022-09-29 DIAGNOSIS — Z9841 Cataract extraction status, right eye: Secondary | ICD-10-CM

## 2022-09-29 DIAGNOSIS — Z885 Allergy status to narcotic agent status: Secondary | ICD-10-CM

## 2022-09-29 LAB — CBC WITH DIFFERENTIAL/PLATELET
Abs Immature Granulocytes: 0.06 10*3/uL (ref 0.00–0.07)
Basophils Absolute: 0 10*3/uL (ref 0.0–0.1)
Basophils Relative: 0 %
Eosinophils Absolute: 0 10*3/uL (ref 0.0–0.5)
Eosinophils Relative: 0 %
HCT: 43.6 % (ref 36.0–46.0)
Hemoglobin: 13.9 g/dL (ref 12.0–15.0)
Immature Granulocytes: 1 %
Lymphocytes Relative: 12 %
Lymphs Abs: 1.4 10*3/uL (ref 0.7–4.0)
MCH: 30.5 pg (ref 26.0–34.0)
MCHC: 31.9 g/dL (ref 30.0–36.0)
MCV: 95.8 fL (ref 80.0–100.0)
Monocytes Absolute: 0.9 10*3/uL (ref 0.1–1.0)
Monocytes Relative: 8 %
Neutro Abs: 9.2 10*3/uL — ABNORMAL HIGH (ref 1.7–7.7)
Neutrophils Relative %: 79 %
Platelets: 289 10*3/uL (ref 150–400)
RBC: 4.55 MIL/uL (ref 3.87–5.11)
RDW: 13.6 % (ref 11.5–15.5)
WBC: 11.6 10*3/uL — ABNORMAL HIGH (ref 4.0–10.5)
nRBC: 0 % (ref 0.0–0.2)

## 2022-09-29 LAB — COMPREHENSIVE METABOLIC PANEL
ALT: 25 U/L (ref 0–44)
AST: 23 U/L (ref 15–41)
Albumin: 3.9 g/dL (ref 3.5–5.0)
Alkaline Phosphatase: 98 U/L (ref 38–126)
Anion gap: 8 (ref 5–15)
BUN: 15 mg/dL (ref 8–23)
CO2: 27 mmol/L (ref 22–32)
Calcium: 8.9 mg/dL (ref 8.9–10.3)
Chloride: 101 mmol/L (ref 98–111)
Creatinine, Ser: 1.01 mg/dL — ABNORMAL HIGH (ref 0.44–1.00)
GFR, Estimated: 58 mL/min — ABNORMAL LOW (ref >60)
Glucose, Bld: 99 mg/dL (ref 70–99)
Potassium: 4 mmol/L (ref 3.5–5.1)
Sodium: 136 mmol/L (ref 135–145)
Total Bilirubin: 1.1 mg/dL (ref 0.3–1.2)
Total Protein: 7.2 g/dL (ref 6.5–8.1)

## 2022-09-29 LAB — URINALYSIS, W/ REFLEX TO CULTURE (INFECTION SUSPECTED)
Bilirubin Urine: NEGATIVE
Glucose, UA: NEGATIVE mg/dL
Ketones, ur: NEGATIVE mg/dL
Nitrite: NEGATIVE
Protein, ur: NEGATIVE mg/dL
Specific Gravity, Urine: 1.021 (ref 1.005–1.030)
pH: 6 (ref 5.0–8.0)

## 2022-09-29 LAB — ABO/RH: ABO/RH(D): O POS

## 2022-09-29 LAB — TYPE AND SCREEN
ABO/RH(D): O POS
Antibody Screen: NEGATIVE

## 2022-09-29 LAB — TROPONIN I (HIGH SENSITIVITY): Troponin I (High Sensitivity): 3 ng/L (ref <18)

## 2022-09-29 LAB — LIPASE, BLOOD: Lipase: 24 U/L (ref 11–51)

## 2022-09-29 MED ORDER — PIPERACILLIN-TAZOBACTAM 3.375 G IVPB 30 MIN: 3.3750 g | Freq: Three times a day (TID) | INTRAVENOUS | Status: DC

## 2022-09-29 MED ORDER — TRAZODONE HCL 50 MG PO TABS
50.0000 mg | ORAL_TABLET | Freq: Every day | ORAL | Status: DC
  Administered 2022-09-29 – 2022-10-02 (×4): 50 mg via ORAL
  Filled 2022-09-29 (×4): qty 1

## 2022-09-29 MED ORDER — AMLODIPINE BESYLATE 5 MG PO TABS
5.0000 mg | ORAL_TABLET | Freq: Every day | ORAL | Status: DC
  Administered 2022-09-29 – 2022-10-03 (×5): 5 mg via ORAL
  Filled 2022-09-29 (×5): qty 1

## 2022-09-29 MED ORDER — MELOXICAM 15 MG PO TABS: 15.0000 mg | ORAL_TABLET | Freq: Every day | ORAL | Status: DC

## 2022-09-29 MED ORDER — ONDANSETRON HCL 4 MG PO TABS: 4.0000 mg | ORAL_TABLET | Freq: Four times a day (QID) | ORAL | Status: DC | PRN

## 2022-09-29 MED ORDER — PANTOPRAZOLE SODIUM 40 MG PO TBEC
40.0000 mg | DELAYED_RELEASE_TABLET | Freq: Every day | ORAL | Status: DC
  Administered 2022-09-29 – 2022-10-03 (×5): 40 mg via ORAL
  Filled 2022-09-29 (×5): qty 1

## 2022-09-29 MED ORDER — FENTANYL CITRATE PF 50 MCG/ML IJ SOSY
50.0000 ug | PREFILLED_SYRINGE | Freq: Once | INTRAMUSCULAR | Status: AC
  Administered 2022-09-29: 50 ug via INTRAVENOUS
  Filled 2022-09-29: qty 1

## 2022-09-29 MED ORDER — ASPIRIN 81 MG PO TBEC
81.0000 mg | DELAYED_RELEASE_TABLET | Freq: Every day | ORAL | Status: DC
  Administered 2022-09-29 – 2022-10-03 (×5): 81 mg via ORAL
  Filled 2022-09-29 (×5): qty 1

## 2022-09-29 MED ORDER — ONDANSETRON HCL 4 MG/2ML IJ SOLN
4.0000 mg | Freq: Once | INTRAMUSCULAR | Status: AC
  Administered 2022-09-29: 4 mg via INTRAVENOUS
  Filled 2022-09-29: qty 2

## 2022-09-29 MED ORDER — LACTATED RINGERS IV SOLN: INTRAVENOUS | Status: DC

## 2022-09-29 MED ORDER — BARIUM SULFATE 2 % PO SUSP
900.0000 mL | Freq: Once | ORAL | Status: AC
  Administered 2022-09-29: 900 mL via ORAL

## 2022-09-29 MED ORDER — OXYCODONE HCL 5 MG PO TABS
5.0000 mg | ORAL_TABLET | ORAL | Status: DC | PRN
  Administered 2022-09-29 – 2022-09-30 (×2): 5 mg via ORAL
  Filled 2022-09-29 (×2): qty 1

## 2022-09-29 MED ORDER — SODIUM CHLORIDE 0.9 % IV BOLUS
500.0000 mL | Freq: Once | INTRAVENOUS | Status: AC
  Administered 2022-09-29: 500 mL via INTRAVENOUS

## 2022-09-29 MED ORDER — LEVOTHYROXINE SODIUM 50 MCG PO TABS
75.0000 ug | ORAL_TABLET | Freq: Every day | ORAL | Status: DC
  Administered 2022-09-30 – 2022-10-03 (×4): 75 ug via ORAL
  Filled 2022-09-29 (×4): qty 1

## 2022-09-29 MED ORDER — PIPERACILLIN-TAZOBACTAM 3.375 G IVPB 30 MIN
3.3750 g | Freq: Once | INTRAVENOUS | Status: AC
  Administered 2022-09-29: 3.375 g via INTRAVENOUS
  Filled 2022-09-29: qty 50

## 2022-09-29 MED ORDER — ACETAMINOPHEN 325 MG PO TABS
650.0000 mg | ORAL_TABLET | Freq: Four times a day (QID) | ORAL | Status: DC | PRN
  Administered 2022-10-01 – 2022-10-02 (×3): 650 mg via ORAL
  Filled 2022-09-29 (×3): qty 2

## 2022-09-29 MED ORDER — ONDANSETRON HCL 4 MG/2ML IJ SOLN
4.0000 mg | Freq: Four times a day (QID) | INTRAMUSCULAR | Status: DC | PRN
  Administered 2022-09-29: 4 mg via INTRAVENOUS
  Filled 2022-09-29: qty 2

## 2022-09-29 MED ORDER — PIPERACILLIN-TAZOBACTAM 3.375 G IVPB
3.3750 g | Freq: Three times a day (TID) | INTRAVENOUS | Status: DC
  Administered 2022-09-29 – 2022-10-03 (×12): 3.375 g via INTRAVENOUS
  Filled 2022-09-29 (×12): qty 50

## 2022-09-29 MED ORDER — BUPROPION HCL ER (XL) 150 MG PO TB24
150.0000 mg | ORAL_TABLET | Freq: Every day | ORAL | Status: DC
  Administered 2022-09-29 – 2022-10-03 (×5): 150 mg via ORAL
  Filled 2022-09-29 (×5): qty 1

## 2022-09-29 MED ORDER — ATORVASTATIN CALCIUM 20 MG PO TABS
20.0000 mg | ORAL_TABLET | Freq: Every day | ORAL | Status: DC
  Administered 2022-09-29 – 2022-10-03 (×5): 20 mg via ORAL
  Filled 2022-09-29 (×5): qty 1

## 2022-09-29 MED ORDER — ACETAMINOPHEN 650 MG RE SUPP: 650.0000 mg | Freq: Four times a day (QID) | RECTAL | Status: DC | PRN

## 2022-09-29 NOTE — ED Notes (Signed)
ED TO INPATIENT HANDOFF REPORT  ED Nurse Name and Phone #: Arnetha Gula Name/Age/Gender Tracey Evans 78 y.o. female Room/Bed: WA18/WA18  Code Status   Code Status: Full Code  Home/SNF/Other Home Patient oriented to: self, place, time, and situation Is this baseline? Yes   Triage Complete: Triage complete  Chief Complaint Diverticulitis [K57.92]  Triage Note No notes on file   Allergies Allergies  Allergen Reactions   Contrast Media [Iodinated Contrast Media] Shortness Of Breath    Patient has SOB, tightness in chest and feels like an elephant is sitting on chest, patient should be  scanned at hospital Per Dr. Gery Pray   Dilaudid [Hydromorphone Hcl] Anaphylaxis    Pt stopped breathing   Adhesive [Tape]     blister   Codeine Nausea Only    insomnia   Cymbalta [Duloxetine Hcl] Other (See Comments)    Hallucinations.    Lactose Intolerance (Gi) Diarrhea   Latex     REACTION: burns skin- Latex tape only    Nsaids    Lyrica [Pregabalin] Other (See Comments)    Mood swings.     Level of Care/Admitting Diagnosis ED Disposition     ED Disposition  Admit   Condition  --   Comment  Hospital Area: [facility] [[id number]]  Level of Care: Med-Surg [16]  May place patient in observation at [facility] or [facility] if equivalent level of care is available:: No  Covid Evaluation: Asymptomatic - no recent exposure (last 10 days) testing not required  Diagnosis: Diverticulitis [638756]  Admitting Physician: Alan Mulder [[id number]]  Attending Physician: Alan Mulder [[id number]]          B Medical/Surgery History Past Medical History:  Diagnosis Date   Angina at rest    "occurs whenever it wants to, but worse during agitation"   Anxiety    Asthma    Binge eating disorder    Breast cancer of lower-outer quadrant of left female breast (HCC) 07/08/2015   "NEVER HAD LEFT BREAST CANCER" (08/15/2015)   Cancer of right breast (HCC)  08/15/2015   Chronic bronchitis (HCC)    Chronic lower back pain    Complication of anesthesia    had bronc spasms during intubation surgery 2009 on foot-need albuterol inhaler or neb tx preop   Costochondritis    Depression    Diverticular disease    11-2020 bleed   Fibromyalgia    GERD (gastroesophageal reflux disease)    Hot flashes    Hyperlipidemia    Hypertension    Hypothyroidism    Osteoarthritis    "all over"   Peptic ulcer disease    Personal history of chemotherapy 2017   Shortness of breath dyspnea    Type II diabetes mellitus (HCC)    "diet controlled" (08/15/2015)   Past Surgical History:  Procedure Laterality Date   ABDOMINAL HYSTERECTOMY     "left my ovaries"   ANKLE ARTHROSCOPY  01/02/2012   Procedure: ANKLE ARTHROSCOPY;  Surgeon: Sherri Rad, MD;  Location: [facility];  Service: Orthopedics;  Laterality: Right;  right ankle arthroscopy with extensive debridement , dridement and drilling talar dome osteochondral lesion   ANKLE FRACTURE SURGERY Right 2009   Dr. Shon Baton   BREAST BIOPSY  07/2015   CARDIAC CATHETERIZATION     CARPAL TUNNEL RELEASE Bilateral    CATARACT EXTRACTION, BILATERAL     COLONOSCOPY     DILATION AND CURETTAGE OF UTERUS     "before  hysterectomy"   FRACTURE SURGERY     KNEE ARTHROSCOPY Bilateral    LAPAROSCOPIC CHOLECYSTECTOMY     MASTECTOMY COMPLETE / SIMPLE W/ SENTINEL NODE BIOPSY Right 08/15/2015   MASTECTOMY W/ SENTINEL NODE BIOPSY Right 08/15/2015   Procedure: TOTAL MASTECTOMY WITH SENTINEL LYMPH NODE BIOPSY AND BLUE DYE INJECTION ;  Surgeon: Claud Kelp, MD;  Location: [facility] OR;  Service: General;  Laterality: Right;   PORT-A-CATH REMOVAL N/A 11/12/2016   Procedure: REMOVAL PORT-A-CATH;  Surgeon: Avel Peace, MD;  Location: [facility];  Service: General;  Laterality: N/A;   PORTACATH PLACEMENT Left 08/15/2015   PORTACATH PLACEMENT Left 08/15/2015   Procedure: INSERTION PORT-A-CATH ;  Surgeon: Claud Kelp, MD;  Location: [facility] OR;  Service: General;  Laterality: Left;   SHOULDER ARTHROSCOPY Bilateral 2009-2011   "bone spurs"   TONSILLECTOMY       A IV Location/Drains/Wounds Patient Lines/Drains/Airways Status     Active Line/Drains/Airways     Name Placement date Placement time Site Days   Peripheral IV 09/29/22 20 G Left Antecubital 09/29/22  1213  Antecubital  less than 1            Intake/Output Last 24 hours No intake or output data in the 24 hours ending 09/29/22 1551  Labs/Imaging Results for orders placed or performed during the hospital encounter of 09/29/22 (from the past 48 hour(s))  Comprehensive metabolic panel     Status: Abnormal   Collection Time: 09/29/22 12:22 PM  Result Value Ref Range   Sodium 136 135 - 145 mmol/L   Potassium 4.0 3.5 - 5.1 mmol/L   Chloride 101 98 - 111 mmol/L   CO2 27 22 - 32 mmol/L   Glucose, Bld 99 70 - 99 mg/dL    Comment: Glucose reference range applies only to samples taken after fasting for at least 8 hours.   BUN 15 8 - 23 mg/dL   Creatinine, Ser 4.40 (H) 0.44 - 1.00 mg/dL   Calcium 8.9 8.9 - 10.2 mg/dL   Total Protein 7.2 6.5 - 8.1 g/dL   Albumin 3.9 3.5 - 5.0 g/dL   AST 23 15 - 41 U/L   ALT 25 0 - 44 U/L   Alkaline Phosphatase 98 38 - 126 U/L   Total Bilirubin 1.1 0.3 - 1.2 mg/dL   GFR, Estimated 58 (L) >60 mL/min    Comment: (NOTE) Calculated using the CKD-EPI Creatinine Equation (2021)    Anion gap 8 5 - 15    Comment: Performed at [facility], [address], [place], [place] [address]  CBC with Differential     Status: Abnormal   Collection Time: 09/29/22 12:22 PM  Result Value Ref Range   WBC 11.6 (H) 4.0 - 10.5 K/uL   RBC 4.55 3.87 - 5.11 MIL/uL   Hemoglobin 13.9 12.0 - 15.0 g/dL   HCT 64.4 03.4 - 74.2 %   MCV 95.8 80.0 - 100.0 fL   MCH 30.5 26.0 - 34.0 pg   MCHC 31.9 30.0 - 36.0 g/dL   RDW 59.5 63.8 - 75.6 %   Platelets 289 150 - 400 K/uL   nRBC 0.0 0.0 - 0.2 %   Neutrophils  Relative % 79 %   Neutro Abs 9.2 (H) 1.7 - 7.7 K/uL   Lymphocytes Relative 12 %   Lymphs Abs 1.4 0.7 - 4.0 K/uL   Monocytes Relative 8 %   Monocytes Absolute 0.9 0.1 - 1.0 K/uL   Eosinophils Relative 0 %  Eosinophils Absolute 0.0 0.0 - 0.5 K/uL   Basophils Relative 0 %   Basophils Absolute 0.0 0.0 - 0.1 K/uL   Immature Granulocytes 1 %   Abs Immature Granulocytes 0.06 0.00 - 0.07 K/uL    Comment: Performed at [facility], [address] [place], [place] [address]  Troponin I (High Sensitivity)     Status: None   Collection Time: 09/29/22 12:22 PM  Result Value Ref Range   Troponin I (High Sensitivity) 3 <18 ng/L    Comment: (NOTE) Elevated high sensitivity troponin I (hsTnI) values and significant  changes across serial measurements may suggest ACS but many other  chronic and acute conditions are known to elevate hsTnI results.  Refer to the "Links" section for chest pain algorithms and additional  guidance. Performed at [facility], [address] [place], [place] [address]   Lipase, blood     Status: None   Collection Time: 09/29/22 12:22 PM  Result Value Ref Range   Lipase 24 11 - 51 U/L    Comment: Performed at [facility], [address] [place], [place] [address]  ABO/Rh     Status: None   Collection Time: 09/29/22 12:22 PM  Result Value Ref Range   ABO/RH(D)      O POS Performed at [facility], [address], [place], [place] [address]   Type and screen [facility]     Status: None   Collection Time: 09/29/22 12:48 PM  Result Value Ref Range   ABO/RH(D) O POS    Antibody Screen NEG    Sample Expiration      10/02/2022,2359 Performed at [facility], [address], [place], [place] [address]    CT ABDOMEN PELVIS WO CONTRAST  Result Date: 09/29/2022 CLINICAL DATA:  Abdominal pain. EXAM: CT ABDOMEN AND PELVIS WITHOUT CONTRAST TECHNIQUE:  Multidetector CT imaging of the abdomen and pelvis was performed following the standard protocol without IV contrast. RADIATION DOSE REDUCTION: This exam was performed according to the departmental dose-optimization program which includes automated exposure control, adjustment of the mA and/or kV according to patient size and/or use of iterative reconstruction technique. COMPARISON:  10/05/2018 FINDINGS: Lower chest: No acute abnormality. Hepatobiliary: No focal liver abnormality is seen. Status post cholecystectomy. No biliary dilatation. Pancreas: Unremarkable. No pancreatic ductal dilatation or surrounding inflammatory changes. Spleen: Normal in size without focal abnormality. Adrenals/Urinary Tract: Normal adrenal glands. Left kidney cysts are identified within the upper pole. The largest measures 2.5 cm. No follow-up imaging recommended. No nephrolithiasis or obstructive uropathy. Urinary bladder appears normal. Stomach/Bowel: Stomach appears normal. The appendix is visualized and appears normal. Distal small bowel diverticulosis identified. Acute small bowel diverticulitis is identified with signs of focally contained perforation. Adjacent to the small bowel is a collection of extraluminal gas measuring 2.3 x 2.0 cm, image 57/2. There is surrounding inflammatory fat stranding noted. No signs of free perforation identified. No discrete abscess identified at this time. Diffuse colonic diverticulosis is also identified without signs of acute diverticulitis. No signs of bowel obstruction. Vascular/Lymphatic: Aortic atherosclerosis. No signs of abdominopelvic adenopathy. Reproductive: Status post hysterectomy. No adnexal masses. Other: No significant free fluid.  No abscess identified. Musculoskeletal: No acute or significant osseous findings. Signs of previous right mastectomy. IMPRESSION: 1. Examination is positive for acute small bowel diverticulitis with signs of focally contained perforation. No signs of free  perforation identified. No discrete abscess identified at this time. 2. Diffuse  colonic diverticulosis without signs of acute diverticulitis. 3.  Aortic Atherosclerosis (ICD10-I70.0). Electronically Signed   By: Signa Kell M.D.   On: 09/29/2022 14:52    Pending Labs Unresulted Labs (From admission, onward)     Start     Ordered   09/30/22 0500  CBC  Tomorrow morning,   R        09/29/22 1533   09/30/22 0500  Basic metabolic panel  Tomorrow morning,   R        09/29/22 1533   09/29/22 1222  Urinalysis, w/ Reflex to Culture (Infection Suspected) -Urine, Clean Catch  Once,   URGENT       Question:  Specimen Source  Answer:  Urine, Clean Catch   09/29/22 1222            Vitals/Pain Today's Vitals   09/29/22 1510 09/29/22 1530 09/29/22 1540 09/29/22 1550  BP: 113/63 110/68    Pulse: 89 89 91 91  Resp: 12 20 16  (!) 22  Temp:    98.1 F (36.7 C)  TempSrc:      SpO2: 93% 90% 92% 93%  Weight:      Height:      PainSc:        Isolation Precautions No active isolations  Medications Medications  aspirin EC tablet 81 mg (has no administration in time range)  meloxicam (MOBIC) tablet 15 mg (has no administration in time range)  amLODipine (NORVASC) tablet 5 mg (has no administration in time range)  atorvastatin (LIPITOR) tablet 20 mg (has no administration in time range)  buPROPion (WELLBUTRIN XL) 24 hr tablet 150 mg (has no administration in time range)  traZODone (DESYREL) tablet 50 mg (has no administration in time range)  levothyroxine (SYNTHROID) tablet 75 mcg (has no administration in time range)  pantoprazole (PROTONIX) EC tablet 40 mg (has no administration in time range)  acetaminophen (TYLENOL) tablet 650 mg (has no administration in time range)    Or  acetaminophen (TYLENOL) suppository 650 mg (has no administration in time range)  oxyCODONE (Oxy IR/ROXICODONE) immediate release tablet 5 mg (has no administration in time range)  ondansetron (ZOFRAN) tablet 4 mg  (has no administration in time range)    Or  ondansetron (ZOFRAN) injection 4 mg (has no administration in time range)  fentaNYL (SUBLIMAZE) injection 50 mcg (50 mcg Intravenous Given 09/29/22 1228)  sodium chloride 0.9 % bolus 500 mL (0 mLs Intravenous Stopped 09/29/22 1500)  barium (READI-CAT 2) 2 % suspension 900 mL (900 mLs Oral Given 09/29/22 1236)  ondansetron (ZOFRAN) injection 4 mg (4 mg Intravenous Given 09/29/22 1452)  piperacillin-tazobactam (ZOSYN) IVPB 3.375 g (0 g Intravenous Stopped 09/29/22 1551)    Mobility walks     Focused Assessments Cardiac Assessment Handoff:    Lab Results  Component Value Date   TROPONINI <0.03 06/09/2018   Lab Results  Component Value Date   DDIMER 0.31 12/31/2013   Does the Patient currently have chest pain? No    R Recommendations: See Admitting Provider Note  Report given to:   Additional Notes:

## 2022-09-29 NOTE — ED Provider Notes (Signed)
Tracey Evans Provider Note   CSN: [id number] Arrival date & time: 09/29/22  1144     History  Chief Complaint  Patient presents with   Abdominal Pain    Tracey Evans is a 77 y.o. female.  HPI 77 year old female presents with abdominal pain.  Started yesterday morning after eating and drinking breakfast.  Has diffuse, severe pain throughout her abdomen as well as bloating.  No fevers, vomiting, diarrhea, blood in the stool or black stools.  However this feels exactly like when she had a GI bleed 2 years ago.  Home Medications Prior to Admission medications   Medication Sig Start Date End Date Taking? Authorizing Provider  albuterol (VENTOLIN HFA) 108 (90 Base) MCG/ACT inhaler Inhale 2 puffs into the lungs every 6 (six) hours as needed for wheezing or shortness of breath. 02/27/21   Shade Flood, MD  amLODipine (NORVASC) 5 MG tablet Take 1 tablet (5 mg total) by mouth daily. 05/18/22   Sheliah Hatch, MD  aspirin 81 MG tablet Take 81 mg by mouth daily.    [provider]  atorvastatin (LIPITOR) 20 MG tablet TAKE 1 TABLET BY MOUTH EVERY DAY 06/11/22   Sheliah Hatch, MD  buPROPion (WELLBUTRIN XL) 150 MG 24 hr tablet TAKE 1 TABLET BY MOUTH EVERY DAY 05/21/22   Sheliah Hatch, MD  gabapentin (NEURONTIN) 300 MG capsule TAKE 1 CAPSULE IN THE MORNING , 1 IN AFTERNOON AND 2 CAPSULES BEFORE BED 09/19/22   Sheliah Hatch, MD  Glycerin-Polysorbate 80 (REFRESH DRY EYE THERAPY OP) Place 1 drop into both eyes 3 (three) times daily.    [provider]  levothyroxine (SYNTHROID) 75 MCG tablet TAKE 1 TABLET BY MOUTH EVERY DAY BEFORE BREAKFAST 05/18/22   Sheliah Hatch, MD  Magnesium 400 MG CAPS Take by mouth 2 (two) times daily.    [provider]  meloxicam (MOBIC) 15 MG tablet TAKE 1 TABLET (15 MG TOTAL) BY MOUTH DAILY. 08/01/22   Sheliah Hatch, MD  omeprazole (PRILOSEC) 20 MG capsule TAKE 1  CAPSULE BY MOUTH EVERY DAY 09/10/22   Sheliah Hatch, MD  traZODone (DESYREL) 50 MG tablet Take 50 mg by mouth at bedtime. 03/25/22   [provider]  Wheat Dextrin (BENEFIBER) POWD Use as directed, daily 05/31/21   Doree Albee, PA-C      Allergies    Contrast media [iodinated contrast media], Dilaudid [hydromorphone hcl], Adhesive [tape], Codeine, Cymbalta [duloxetine hcl], Lactose intolerance (gi), Latex, Nsaids, and Lyrica [pregabalin]    Review of Systems   Review of Systems  Constitutional:  Negative for fever.  Gastrointestinal:  Positive for abdominal distention and abdominal pain. Negative for blood in stool, diarrhea, nausea and vomiting.    Physical Exam Updated Vital Signs BP 130/81 (BP Location: Left Arm)   Pulse 87   Temp 97.8 F (36.6 C) (Oral)   Resp 16   Ht 5\' 4"  (1.626 m)   Wt 85.4 kg   SpO2 100%   BMI 32.33 kg/m  Physical Exam Vitals and nursing note reviewed.  Constitutional:      General: She is not in acute distress.    Appearance: She is well-developed. She is not ill-appearing or diaphoretic.  HENT:     Head: Normocephalic and atraumatic.  Cardiovascular:     Rate and Rhythm: Normal rate and regular rhythm.     Heart sounds: Normal heart sounds.  Pulmonary:  Effort: Pulmonary effort is normal.     Breath sounds: Normal breath sounds.  Abdominal:     Palpations: Abdomen is soft.     Tenderness: There is generalized abdominal tenderness (worst in lower abdomen).  Skin:    General: Skin is warm and dry.  Neurological:     Mental Status: She is alert.     ED Results / Procedures / Treatments   Labs (all labs ordered are listed, but only abnormal results are displayed) Labs Reviewed  COMPREHENSIVE METABOLIC PANEL - Abnormal; Notable for the following components:      Result Value   Creatinine, Ser 1.01 (*)    GFR, Estimated 58 (*)    All other components within normal limits  CBC WITH DIFFERENTIAL/PLATELET - Abnormal;  Notable for the following components:   WBC 11.6 (*)    Neutro Abs 9.2 (*)    All other components within normal limits  LIPASE, BLOOD  URINALYSIS, W/ REFLEX TO CULTURE (INFECTION SUSPECTED)  TYPE AND SCREEN  ABO/RH  TROPONIN I (HIGH SENSITIVITY)    EKG EKG Interpretation  Date/Time:  Saturday September 29 2022 13:14:03 EDT Ventricular Rate:  76 PR Interval:  171 QRS Duration: 90 QT Interval:  357 QTC Calculation: 402 R Axis:   21 Text Interpretation: Sinus rhythm Low voltage, precordial leads Abnormal R-wave progression, early transition similar to Mar 2020 Confirmed by Pricilla Loveless [phone]) on 09/29/2022 1:54:11 PM  Radiology CT ABDOMEN PELVIS WO CONTRAST  Result Date: 09/29/2022 CLINICAL DATA:  Abdominal pain. EXAM: CT ABDOMEN AND PELVIS WITHOUT CONTRAST TECHNIQUE: Multidetector CT imaging of the abdomen and pelvis was performed following the standard protocol without IV contrast. RADIATION DOSE REDUCTION: This exam was performed according to the departmental dose-optimization program which includes automated exposure control, adjustment of the mA and/or kV according to patient size and/or use of iterative reconstruction technique. COMPARISON:  10/05/2018 FINDINGS: Lower chest: No acute abnormality. Hepatobiliary: No focal liver abnormality is seen. Status post cholecystectomy. No biliary dilatation. Pancreas: Unremarkable. No pancreatic ductal dilatation or surrounding inflammatory changes. Spleen: Normal in size without focal abnormality. Adrenals/Urinary Tract: Normal adrenal glands. Left kidney cysts are identified within the upper pole. The largest measures 2.5 cm. No follow-up imaging recommended. No nephrolithiasis or obstructive uropathy. Urinary bladder appears normal. Stomach/Bowel: Stomach appears normal. The appendix is visualized and appears normal. Distal small bowel diverticulosis identified. Acute small bowel diverticulitis is identified with signs of focally contained  perforation. Adjacent to the small bowel is a collection of extraluminal gas measuring 2.3 x 2.0 cm, image 57/2. There is surrounding inflammatory fat stranding noted. No signs of free perforation identified. No discrete abscess identified at this time. Diffuse colonic diverticulosis is also identified without signs of acute diverticulitis. No signs of bowel obstruction. Vascular/Lymphatic: Aortic atherosclerosis. No signs of abdominopelvic adenopathy. Reproductive: Status post hysterectomy. No adnexal masses. Other: No significant free fluid.  No abscess identified. Musculoskeletal: No acute or significant osseous findings. Signs of previous right mastectomy. IMPRESSION: 1. Examination is positive for acute small bowel diverticulitis with signs of focally contained perforation. No signs of free perforation identified. No discrete abscess identified at this time. 2. Diffuse colonic diverticulosis without signs of acute diverticulitis. 3.  Aortic Atherosclerosis (ICD10-I70.0). Electronically Signed   By: Signa Kell M.D.   On: 09/29/2022 14:52    Procedures .Critical Care  Performed by: Pricilla Loveless, MD Authorized by: Pricilla Loveless, MD   Critical care provider statement:    Critical care time (minutes):  30  Critical care time was exclusive of:  Separately billable procedures and treating other patients   Critical care was necessary to treat or prevent imminent or life-threatening deterioration of the following conditions:  Sepsis   Critical care was time spent personally by me on the following activities:  Development of treatment plan with patient or surrogate, discussions with consultants, evaluation of patient's response to treatment, examination of patient, ordering and review of laboratory studies, ordering and review of radiographic studies, ordering and performing treatments and interventions, pulse oximetry, re-evaluation of patient's condition and review of old charts     Medications  Ordered in ED Medications  piperacillin-tazobactam (ZOSYN) IVPB 3.375 g (has no administration in time range)  fentaNYL (SUBLIMAZE) injection 50 mcg (50 mcg Intravenous Given 09/29/22 1228)  sodium chloride 0.9 % bolus 500 mL (0 mLs Intravenous Stopped 09/29/22 1500)  barium (READI-CAT 2) 2 % suspension 900 mL (900 mLs Oral Given 09/29/22 1236)  ondansetron (ZOFRAN) injection 4 mg (4 mg Intravenous Given 09/29/22 1452)    ED Course/ Medical Decision Making/ A&P                             Medical Decision Making Amount and/or Complexity of Data Reviewed Labs: ordered.    Details: Mild leukocytosis. Radiology: ordered and independent interpretation performed.    Details: Acute diverticulitis with microperforation  Risk Prescription drug management. Decision regarding hospitalization.   Patient was given IV fentanyl for pain.  Also started on some fluids.  CT shows acute diverticulitis, which she seemed to have in 2022 at an outside hospital.  This time she is appears to have a microperforation and will need admission for monitoring, pain control, IV antibiotics.  Discussed CT images with radiology, Dr. Bradly Chris.  I have consulted the hospitalist and general surgery as well. Dr. Magnus Ivan will see in consultation. Recommends bowel rest, antibiotics.         Final Clinical Impression(s) / ED Diagnoses Final diagnoses:  Acute diverticulitis    Rx / DC Orders ED Discharge Orders     None         Pricilla Loveless, MD 09/29/22 [phone]

## 2022-09-29 NOTE — H&P (Signed)
History and Physical    Tracey Evans GNF:[id number] DOB: [birth year] DOA: 09/29/2022  PCP: Sheliah Hatch, MD   Chief Complaint: abd pain  HPI: Tracey Evans is a 77 y.o. female with medical history significant of history of chest pain, asthma, hyperlipidemia, hypertension who presented to the emergency department with severe abdominal pain.  Patient reports history of 2 prior episodes of diverticulitis as well as a GI bleed from an ulcer.  Since then she has had normal bowel moods however she acutely developed severe pain in her abdomen.  She presents emergency department she was found to be afebrile hemodynamically stable.  Labs were taken which showed sodium 136, creatinine 1.01, WBC 11.6, hemoglobin 13.9, troponin 3, lipase 24.  CT abdomen pelvis shows acute small bowel diverticulitis with signs of focal contained perforation and diffuse colonic diverticulosis.  Surgery was consulted in the emergency department and patient preferred conservative therapy.  Patient was admitted to hospital service for further management.  She was started on IV Zosyn.   Review of Systems: Review of Systems  All other systems reviewed and are negative.    As per HPI otherwise 10 point review of systems negative.   Allergies  Allergen Reactions   Contrast Media [Iodinated Contrast Media] Shortness Of Breath    Patient has SOB, tightness in chest and feels like an elephant is sitting on chest, patient should be  scanned at hospital Per Dr. Gery Pray   Dilaudid [Hydromorphone Hcl] Anaphylaxis    Pt stopped breathing   Adhesive [Tape]     blister   Codeine Nausea Only    insomnia   Cymbalta [Duloxetine Hcl] Other (See Comments)    Hallucinations.    Lactose Intolerance (Gi) Diarrhea   Latex     REACTION: burns skin- Latex tape only    Nsaids    Lyrica [Pregabalin] Other (See Comments)    Mood swings.     Past Medical History:  Diagnosis Date   Angina at rest    "occurs whenever it  wants to, but worse during agitation"   Anxiety    Asthma    Binge eating disorder    Breast cancer of lower-outer quadrant of left female breast (HCC) 07/08/2015   "NEVER HAD LEFT BREAST CANCER" (08/15/2015)   Cancer of right breast (HCC) 08/15/2015   Chronic bronchitis (HCC)    Chronic lower back pain    Complication of anesthesia    had bronc spasms during intubation surgery 2009 on foot-need albuterol inhaler or neb tx preop   Costochondritis    Depression    Diverticular disease    11-2020 bleed   Fibromyalgia    GERD (gastroesophageal reflux disease)    Hot flashes    Hyperlipidemia    Hypertension    Hypothyroidism    Osteoarthritis    "all over"   Peptic ulcer disease    Personal history of chemotherapy 2017   Shortness of breath dyspnea    Type II diabetes mellitus (HCC)    "diet controlled" (08/15/2015)    Past Surgical History:  Procedure Laterality Date   ABDOMINAL HYSTERECTOMY     "left my ovaries"   ANKLE ARTHROSCOPY  01/02/2012   Procedure: ANKLE ARTHROSCOPY;  Surgeon: Sherri Rad, MD;  Location: [facility];  Service: Orthopedics;  Laterality: Right;  right ankle arthroscopy with extensive debridement , dridement and drilling talar dome osteochondral lesion   ANKLE FRACTURE SURGERY Right 2009   Dr. Shon Baton   BREAST BIOPSY  07/2015   CARDIAC CATHETERIZATION     CARPAL TUNNEL RELEASE Bilateral    CATARACT EXTRACTION, BILATERAL     COLONOSCOPY     DILATION AND CURETTAGE OF UTERUS     "before hysterectomy"   FRACTURE SURGERY     KNEE ARTHROSCOPY Bilateral    LAPAROSCOPIC CHOLECYSTECTOMY     MASTECTOMY COMPLETE / SIMPLE W/ SENTINEL NODE BIOPSY Right 08/15/2015   MASTECTOMY W/ SENTINEL NODE BIOPSY Right 08/15/2015   Procedure: TOTAL MASTECTOMY WITH SENTINEL LYMPH NODE BIOPSY AND BLUE DYE INJECTION ;  Surgeon: Claud Kelp, MD;  Location: [facility];  Service: General;  Laterality: Right;   PORT-A-CATH REMOVAL N/A 11/12/2016   Procedure: REMOVAL  PORT-A-CATH;  Surgeon: Avel Peace, MD;  Location: [facility];  Service: General;  Laterality: N/A;   PORTACATH PLACEMENT Left 08/15/2015   PORTACATH PLACEMENT Left 08/15/2015   Procedure: INSERTION PORT-A-CATH ;  Surgeon: Claud Kelp, MD;  Location: [facility];  Service: General;  Laterality: Left;   SHOULDER ARTHROSCOPY Bilateral 2009-2011   "bone spurs"   TONSILLECTOMY       reports that she has never smoked. She has never used smokeless tobacco. She reports that she does not currently use alcohol. She reports that she does not use drugs.  Family History  Problem Relation Age of Onset   Colon polyps Mother    Dementia Mother    Stroke Mother    Heart Problems Mother    Dementia Father    Alcohol abuse Father    Colon cancer Father        dx. 74 or younger-pt states never confirmed   Breast cancer Sister 36       inflammatory   Diverticulitis Sister        maternal half-sister; severe - causing partial colectomy   Breast cancer Sister        maternal half-sister; dx. early 98s   Alcohol abuse Brother    Colon polyps Brother    Breast cancer Other 56       niece   Breast cancer Other        niece dx. 50s   Esophageal cancer Neg Hx    Stomach cancer Neg Hx    Rectal cancer Neg Hx     Prior to Admission medications   Medication Sig Start Date End Date Taking? Authorizing Provider  albuterol (VENTOLIN HFA) 108 (90 Base) MCG/ACT inhaler Inhale 2 puffs into the lungs every 6 (six) hours as needed for wheezing or shortness of breath. 02/27/21  Yes Shade Flood, MD  amLODipine (NORVASC) 5 MG tablet Take 1 tablet (5 mg total) by mouth daily. 05/18/22  Yes Sheliah Hatch, MD  aspirin 81 MG tablet Take 81 mg by mouth daily.   Yes [provider]  atorvastatin (LIPITOR) 20 MG tablet TAKE 1 TABLET BY MOUTH EVERY DAY 06/11/22  Yes Sheliah Hatch, MD  buPROPion (WELLBUTRIN XL) 150 MG 24 hr tablet TAKE 1 TABLET BY MOUTH EVERY DAY 05/21/22  Yes Sheliah Hatch, MD  gabapentin (NEURONTIN) 300 MG capsule TAKE 1 CAPSULE IN THE MORNING , 1 IN AFTERNOON AND 2 CAPSULES BEFORE BED Patient taking differently: Take 300-600 mg by mouth 3 (three) times daily. TAKE 1 CAPSULE IN THE MORNING , 1 IN AFTERNOON AND 2 CAPSULES BEFORE BED 09/19/22  Yes Sheliah Hatch, MD  Glycerin-Polysorbate 80 (REFRESH DRY EYE THERAPY OP) Place 1 drop into both eyes 3 (three) times daily.   Yes [provider]  levothyroxine (SYNTHROID) 75 MCG tablet TAKE 1 TABLET BY MOUTH EVERY DAY BEFORE BREAKFAST Patient taking differently: Take 75 mcg by mouth daily before breakfast. 05/18/22  Yes Sheliah Hatch, MD  Magnesium 400 MG CAPS Take by mouth 2 (two) times daily.   Yes [provider]  meloxicam (MOBIC) 15 MG tablet TAKE 1 TABLET (15 MG TOTAL) BY MOUTH DAILY. 08/01/22  Yes Sheliah Hatch, MD  omeprazole (PRILOSEC) 20 MG capsule TAKE 1 CAPSULE BY MOUTH EVERY DAY Patient taking differently: Take 20 mg by mouth daily. 09/10/22  Yes Sheliah Hatch, MD  traZODone (DESYREL) 50 MG tablet Take 50 mg by mouth at bedtime. 03/25/22  Yes [provider]  Wheat Dextrin (BENEFIBER) POWD Use as directed, daily 05/31/21  Yes Doree Albee, [place]    Physical Exam: Vitals:   09/29/22 1530 09/29/22 1540 09/29/22 1550 09/29/22 1655  BP: 110/68   (!) 145/72  Pulse: 89 91 91 89  Resp: 20 16 (!) 22   Temp:   98.1 F (36.7 C) 99.1 F (37.3 C)  TempSrc:    Oral  SpO2: 90% 92% 93% 95%  Weight:      Height:       Physical Exam Constitutional:      General: She is not in acute distress.    Appearance: She is normal weight.  HENT:     Head: Normocephalic.     Mouth/Throat:     Mouth: Mucous membranes are moist.  Cardiovascular:     Rate and Rhythm: Normal rate and regular rhythm.     Heart sounds: Normal heart sounds.  Pulmonary:     Effort: Pulmonary effort is normal.     Breath sounds: Normal breath sounds.  Abdominal:     General:  Abdomen is flat. There is no distension.     Palpations: Abdomen is soft.  Genitourinary:    Rectum: Normal.  Skin:    General: Skin is warm.  Neurological:     Mental Status: She is alert.        Labs on Admission: I have personally reviewed the patients's labs and imaging studies.  Assessment/Plan Active Problems:   Diverticulitis   # Acute small bowel diverticulitis, POA, active - Patient had 2 prior episodes of diverticulitis - Patient has small bowel diverticulitis on CT with evidence of microperforation - Patient nontoxic-appearing with mild abdominal pain  Plan: Continue IV Zosyn Continue IV fluids Advance diet as tolerated Appreciate surgical recommendations  # Hypertension-continue amlodipine  # Generalized anxiety-continue Wellbutrin  # GERD-continue Protonix  # CAD-continue aspirin  # Hyperlipidemia-continue Lipitor  # Hypothyroidism-continue levothyroxine  Admission status: Observation Med-Surg  Certification: The appropriate patient status for this patient is OBSERVATION. Observation status is judged to be reasonable and necessary in order to provide the required intensity of service to ensure the patient's safety. The patient's presenting symptoms, physical exam findings, and initial radiographic and laboratory data in the context of their medical condition is felt to place them at decreased risk for further clinical deterioration. Furthermore, it is anticipated that the patient will be medically stable for discharge from the hospital within 2 midnights of admission.     Alan Mulder MD [facility] If 7PM-7AM, please contact night-coverage [email or web address]  09/29/2022, 5:08 PM

## 2022-09-29 NOTE — Consult Note (Signed)
Reason for Consult: Small bowel diverticulitis with microperforation Referring Physician: Dr. Alan Mulder  CAROLYN SYLVIA is an 77 y.o. female.  HPI: This is a 77 year old female who presents with abdominal pain.  She reports it started yesterday after breakfast.  She describes severe pain throughout her abdomen.  She describes it as sharp and intermittent.  She said it was similar to pain she had when she had a GI bleed from an ulcer several years ago.  She denies nausea or vomiting.  Bowel movements have been normal.  She has not had a previous history of of small bowel diverticulitis per her report.  Her pain has improved since arrival  Past Medical History:  Diagnosis Date   Angina at rest    "occurs whenever it wants to, but worse during agitation"   Anxiety    Asthma    Binge eating disorder    Breast cancer of lower-outer quadrant of left female breast (HCC) 07/08/2015   "NEVER HAD LEFT BREAST CANCER" (08/15/2015)   Cancer of right breast (HCC) 08/15/2015   Chronic bronchitis (HCC)    Chronic lower back pain    Complication of anesthesia    had bronc spasms during intubation surgery 2009 on foot-need albuterol inhaler or neb tx preop   Costochondritis    Depression    Diverticular disease    11-2020 bleed   Fibromyalgia    GERD (gastroesophageal reflux disease)    Hot flashes    Hyperlipidemia    Hypertension    Hypothyroidism    Osteoarthritis    "all over"   Peptic ulcer disease    Personal history of chemotherapy 2017   Shortness of breath dyspnea    Type II diabetes mellitus (HCC)    "diet controlled" (08/15/2015)    Past Surgical History:  Procedure Laterality Date   ABDOMINAL HYSTERECTOMY     "left my ovaries"   ANKLE ARTHROSCOPY  01/02/2012   Procedure: ANKLE ARTHROSCOPY;  Surgeon: Sherri Rad, MD;  Location: [facility];  Service: Orthopedics;  Laterality: Right;  right ankle arthroscopy with extensive debridement , dridement and drilling  talar dome osteochondral lesion   ANKLE FRACTURE SURGERY Right 2009   Dr. Shon Baton   BREAST BIOPSY  07/2015   CARDIAC CATHETERIZATION     CARPAL TUNNEL RELEASE Bilateral    CATARACT EXTRACTION, BILATERAL     COLONOSCOPY     DILATION AND CURETTAGE OF UTERUS     "before hysterectomy"   FRACTURE SURGERY     KNEE ARTHROSCOPY Bilateral    LAPAROSCOPIC CHOLECYSTECTOMY     MASTECTOMY COMPLETE / SIMPLE W/ SENTINEL NODE BIOPSY Right 08/15/2015   MASTECTOMY W/ SENTINEL NODE BIOPSY Right 08/15/2015   Procedure: TOTAL MASTECTOMY WITH SENTINEL LYMPH NODE BIOPSY AND BLUE DYE INJECTION ;  Surgeon: Claud Kelp, MD;  Location: [facility];  Service: General;  Laterality: Right;   PORT-A-CATH REMOVAL N/A 11/12/2016   Procedure: REMOVAL PORT-A-CATH;  Surgeon: Avel Peace, MD;  Location: [facility];  Service: General;  Laterality: N/A;   PORTACATH PLACEMENT Left 08/15/2015   PORTACATH PLACEMENT Left 08/15/2015   Procedure: INSERTION PORT-A-CATH ;  Surgeon: Claud Kelp, MD;  Location: [facility];  Service: General;  Laterality: Left;   SHOULDER ARTHROSCOPY Bilateral 2009-2011   "bone spurs"   TONSILLECTOMY      Family History  Problem Relation Age of Onset   Colon polyps Mother    Dementia Mother    Stroke Mother    Heart  Problems Mother    Dementia Father    Alcohol abuse Father    Colon cancer Father        dx. 74 or younger-pt states never confirmed   Breast cancer Sister 31       inflammatory   Diverticulitis Sister        maternal half-sister; severe - causing partial colectomy   Breast cancer Sister        maternal half-sister; dx. early 14s   Alcohol abuse Brother    Colon polyps Brother    Breast cancer Other 53       niece   Breast cancer Other        niece dx. 50s   Esophageal cancer Neg Hx    Stomach cancer Neg Hx    Rectal cancer Neg Hx     Social History:  reports that she has never smoked. She has never used smokeless tobacco. She reports that she does not currently  use alcohol. She reports that she does not use drugs.  Allergies:  Allergies  Allergen Reactions   Contrast Media [Iodinated Contrast Media] Shortness Of Breath    Patient has SOB, tightness in chest and feels like an elephant is sitting on chest, patient should be  scanned at hospital Per Dr. Gery Pray   Dilaudid [Hydromorphone Hcl] Anaphylaxis    Pt stopped breathing   Adhesive [Tape]     blister   Codeine Nausea Only    insomnia   Cymbalta [Duloxetine Hcl] Other (See Comments)    Hallucinations.    Lactose Intolerance (Gi) Diarrhea   Latex     REACTION: burns skin- Latex tape only    Nsaids    Lyrica [Pregabalin] Other (See Comments)    Mood swings.     Medications: I have reviewed the patient's current medications.  Results for orders placed or performed during the hospital encounter of 09/29/22 (from the past 48 hour(s))  Comprehensive metabolic panel     Status: Abnormal   Collection Time: 09/29/22 12:22 PM  Result Value Ref Range   Sodium 136 135 - 145 mmol/L   Potassium 4.0 3.5 - 5.1 mmol/L   Chloride 101 98 - 111 mmol/L   CO2 27 22 - 32 mmol/L   Glucose, Bld 99 70 - 99 mg/dL    Comment: Glucose reference range applies only to samples taken after fasting for at least 8 hours.   BUN 15 8 - 23 mg/dL   Creatinine, Ser 7.82 (H) 0.44 - 1.00 mg/dL   Calcium 8.9 8.9 - 95.6 mg/dL   Total Protein 7.2 6.5 - 8.1 g/dL   Albumin 3.9 3.5 - 5.0 g/dL   AST 23 15 - 41 U/L   ALT 25 0 - 44 U/L   Alkaline Phosphatase 98 38 - 126 U/L   Total Bilirubin 1.1 0.3 - 1.2 mg/dL   GFR, Estimated 58 (L) >60 mL/min    Comment: (NOTE) Calculated using the CKD-EPI Creatinine Equation (2021)    Anion gap 8 5 - 15    Comment: Performed at [facility], [address], [place], [place] [address]  CBC with Differential     Status: Abnormal   Collection Time: 09/29/22 12:22 PM  Result Value Ref Range   WBC 11.6 (H) 4.0 - 10.5 K/uL   RBC 4.55 3.87 - 5.11 MIL/uL   Hemoglobin  13.9 12.0 - 15.0 g/dL   HCT 65.7 84.6 - 96.2 %   MCV 95.8 80.0 - 100.0  fL   MCH 30.5 26.0 - 34.0 pg   MCHC 31.9 30.0 - 36.0 g/dL   RDW 01.0 27.2 - 53.6 %   Platelets 289 150 - 400 K/uL   nRBC 0.0 0.0 - 0.2 %   Neutrophils Relative % 79 %   Neutro Abs 9.2 (H) 1.7 - 7.7 K/uL   Lymphocytes Relative 12 %   Lymphs Abs 1.4 0.7 - 4.0 K/uL   Monocytes Relative 8 %   Monocytes Absolute 0.9 0.1 - 1.0 K/uL   Eosinophils Relative 0 %   Eosinophils Absolute 0.0 0.0 - 0.5 K/uL   Basophils Relative 0 %   Basophils Absolute 0.0 0.0 - 0.1 K/uL   Immature Granulocytes 1 %   Abs Immature Granulocytes 0.06 0.00 - 0.07 K/uL    Comment: Performed at [facility], [address] [place], [place] [address]  Troponin I (High Sensitivity)     Status: None   Collection Time: 09/29/22 12:22 PM  Result Value Ref Range   Troponin I (High Sensitivity) 3 <18 ng/L    Comment: (NOTE) Elevated high sensitivity troponin I (hsTnI) values and significant  changes across serial measurements may suggest ACS but many other  chronic and acute conditions are known to elevate hsTnI results.  Refer to the "Links" section for chest pain algorithms and additional  guidance. Performed at [facility], [address], [place], [place] [address]   Lipase, blood     Status: None   Collection Time: 09/29/22 12:22 PM  Result Value Ref Range   Lipase 24 11 - 51 U/L    Comment: Performed at [facility], [address] [place], [place] [address]  ABO/Rh     Status: None   Collection Time: 09/29/22 12:22 PM  Result Value Ref Range   ABO/RH(D)      O POS Performed at [facility], [address], [place], [place] [address]   Type and screen [facility]     Status: None   Collection Time: 09/29/22 12:48 PM  Result Value Ref Range   ABO/RH(D) O POS    Antibody Screen NEG    Sample Expiration      10/02/2022,2359 Performed at  [facility], [address] [place], [place] [address]     CT ABDOMEN PELVIS WO CONTRAST  Result Date: 09/29/2022 CLINICAL DATA:  Abdominal pain. EXAM: CT ABDOMEN AND PELVIS WITHOUT CONTRAST TECHNIQUE: Multidetector CT imaging of the abdomen and pelvis was performed following the standard protocol without IV contrast. RADIATION DOSE REDUCTION: This exam was performed according to the departmental dose-optimization program which includes automated exposure control, adjustment of the mA and/or kV according to patient size and/or use of iterative reconstruction technique. COMPARISON:  10/05/2018 FINDINGS: Lower chest: No acute abnormality. Hepatobiliary: No focal liver abnormality is seen. Status post cholecystectomy. No biliary dilatation. Pancreas: Unremarkable. No pancreatic ductal dilatation or surrounding inflammatory changes. Spleen: Normal in size without focal abnormality. Adrenals/Urinary Tract: Normal adrenal glands. Left kidney cysts are identified within the upper pole. The largest measures 2.5 cm. No follow-up imaging recommended. No nephrolithiasis or obstructive uropathy. Urinary bladder appears normal. Stomach/Bowel: Stomach appears normal. The appendix is visualized and appears normal. Distal small bowel diverticulosis identified. Acute small bowel diverticulitis is identified with signs of focally contained perforation. Adjacent to the small bowel is a collection of extraluminal gas measuring 2.3 x 2.0 cm, image 57/2. There is surrounding inflammatory fat stranding noted.  No signs of free perforation identified. No discrete abscess identified at this time. Diffuse colonic diverticulosis is also identified without signs of acute diverticulitis. No signs of bowel obstruction. Vascular/Lymphatic: Aortic atherosclerosis. No signs of abdominopelvic adenopathy. Reproductive: Status post hysterectomy. No adnexal masses. Other: No significant free fluid.  No abscess identified.  Musculoskeletal: No acute or significant osseous findings. Signs of previous right mastectomy. IMPRESSION: 1. Examination is positive for acute small bowel diverticulitis with signs of focally contained perforation. No signs of free perforation identified. No discrete abscess identified at this time. 2. Diffuse colonic diverticulosis without signs of acute diverticulitis. 3.  Aortic Atherosclerosis (ICD10-I70.0). Electronically Signed   By: Signa Kell M.D.   On: 09/29/2022 14:52    Review of Systems  Constitutional:  Negative for chills and fever.  Respiratory:  Negative for shortness of breath.   Cardiovascular:  Negative for chest pain.  Genitourinary:  Negative for dysuria.   Blood pressure 110/68, pulse 91, temperature 98.1 F (36.7 C), resp. rate (!) 22, height 5\' 4"  (1.626 m), weight 85.4 kg, SpO2 93 %. Physical Exam Constitutional:      Appearance: She is well-developed. She is not ill-appearing.  HENT:     Head: Normocephalic and atraumatic.  Eyes:     General: No scleral icterus. Cardiovascular:     Rate and Rhythm: Normal rate and regular rhythm.  Pulmonary:     Effort: Pulmonary effort is normal. No respiratory distress.     Breath sounds: Normal breath sounds.  Abdominal:     Comments: Abdomen is obese.  There is some tenderness with guarding in the right lower quadrant and mid abdomen.  She does not have frank peritonitis  Skin:    General: Skin is warm and dry.  Neurological:     General: No focal deficit present.     Mental Status: She is alert and oriented to person, place, and time.  Psychiatric:        Mood and Affect: Mood is not anxious.        Behavior: Behavior normal.     Assessment/Plan: Small bowel diverticulitis with microperforation  I have reviewed the CT scan of her abdomen and pelvis.  I discussed the diagnosis with her.  We discussed that attempt at conservative management with bowel rest and IV antibiotics.  We also discussed the potential  need for surgical intervention.  She would like to proceed conservatively if possible.  I believe we can hold on nasogastric tube insertion.  We will follow her abdominal exam closely.  Should she acutely worsen, she will require a laparotomy.  Hopefully this will improve without the need for surgical intervention.  We will consider repeat CT scan in a few days if she remains stable but if pain persist, fever develops, or white blood count increases. We will follow her closely.  She agrees with the current plans  Complex medical decision making  Abigail Miyamoto 09/29/2022, 4:30 PM

## 2022-09-29 NOTE — ED Notes (Signed)
No sticks right arm, previous breast cancer. Pink bracelet applied. Fall risk bracelet applied also.

## 2022-09-30 DIAGNOSIS — E860 Dehydration: Secondary | ICD-10-CM | POA: Diagnosis present

## 2022-09-30 DIAGNOSIS — I2510 Atherosclerotic heart disease of native coronary artery without angina pectoris: Secondary | ICD-10-CM | POA: Diagnosis present

## 2022-09-30 DIAGNOSIS — K219 Gastro-esophageal reflux disease without esophagitis: Secondary | ICD-10-CM | POA: Diagnosis present

## 2022-09-30 DIAGNOSIS — Z9221 Personal history of antineoplastic chemotherapy: Secondary | ICD-10-CM | POA: Diagnosis not present

## 2022-09-30 DIAGNOSIS — E669 Obesity, unspecified: Secondary | ICD-10-CM | POA: Diagnosis present

## 2022-09-30 DIAGNOSIS — I10 Essential (primary) hypertension: Secondary | ICD-10-CM | POA: Diagnosis present

## 2022-09-30 DIAGNOSIS — F32A Depression, unspecified: Secondary | ICD-10-CM | POA: Diagnosis present

## 2022-09-30 DIAGNOSIS — E039 Hypothyroidism, unspecified: Secondary | ICD-10-CM | POA: Diagnosis present

## 2022-09-30 DIAGNOSIS — K5792 Diverticulitis of intestine, part unspecified, without perforation or abscess without bleeding: Secondary | ICD-10-CM | POA: Diagnosis present

## 2022-09-30 DIAGNOSIS — E785 Hyperlipidemia, unspecified: Secondary | ICD-10-CM | POA: Diagnosis present

## 2022-09-30 DIAGNOSIS — E119 Type 2 diabetes mellitus without complications: Secondary | ICD-10-CM | POA: Diagnosis present

## 2022-09-30 DIAGNOSIS — M797 Fibromyalgia: Secondary | ICD-10-CM | POA: Diagnosis present

## 2022-09-30 DIAGNOSIS — Z7982 Long term (current) use of aspirin: Secondary | ICD-10-CM | POA: Diagnosis not present

## 2022-09-30 DIAGNOSIS — Z803 Family history of malignant neoplasm of breast: Secondary | ICD-10-CM | POA: Diagnosis not present

## 2022-09-30 DIAGNOSIS — E739 Lactose intolerance, unspecified: Secondary | ICD-10-CM | POA: Diagnosis present

## 2022-09-30 DIAGNOSIS — Z79890 Hormone replacement therapy (postmenopausal): Secondary | ICD-10-CM | POA: Diagnosis not present

## 2022-09-30 DIAGNOSIS — Z91041 Radiographic dye allergy status: Secondary | ICD-10-CM | POA: Diagnosis not present

## 2022-09-30 DIAGNOSIS — Z853 Personal history of malignant neoplasm of breast: Secondary | ICD-10-CM | POA: Diagnosis not present

## 2022-09-30 DIAGNOSIS — Z79899 Other long term (current) drug therapy: Secondary | ICD-10-CM | POA: Diagnosis not present

## 2022-09-30 DIAGNOSIS — Z90710 Acquired absence of both cervix and uterus: Secondary | ICD-10-CM | POA: Diagnosis not present

## 2022-09-30 DIAGNOSIS — F411 Generalized anxiety disorder: Secondary | ICD-10-CM | POA: Diagnosis present

## 2022-09-30 DIAGNOSIS — K5700 Diverticulitis of small intestine with perforation and abscess without bleeding: Secondary | ICD-10-CM | POA: Diagnosis present

## 2022-09-30 DIAGNOSIS — Z8711 Personal history of peptic ulcer disease: Secondary | ICD-10-CM | POA: Diagnosis not present

## 2022-09-30 DIAGNOSIS — J4489 Other specified chronic obstructive pulmonary disease: Secondary | ICD-10-CM | POA: Diagnosis present

## 2022-09-30 DIAGNOSIS — Z9011 Acquired absence of right breast and nipple: Secondary | ICD-10-CM | POA: Diagnosis not present

## 2022-09-30 LAB — CBC
HCT: 35.3 % — ABNORMAL LOW (ref 36.0–46.0)
Hemoglobin: 11.2 g/dL — ABNORMAL LOW (ref 12.0–15.0)
MCH: 30.5 pg (ref 26.0–34.0)
MCHC: 31.7 g/dL (ref 30.0–36.0)
MCV: 96.2 fL (ref 80.0–100.0)
Platelets: 225 10*3/uL (ref 150–400)
RBC: 3.67 MIL/uL — ABNORMAL LOW (ref 3.87–5.11)
RDW: 13.5 % (ref 11.5–15.5)
WBC: 12.2 10*3/uL — ABNORMAL HIGH (ref 4.0–10.5)
nRBC: 0 % (ref 0.0–0.2)

## 2022-09-30 LAB — BASIC METABOLIC PANEL
Anion gap: 6 (ref 5–15)
BUN: 15 mg/dL (ref 8–23)
CO2: 25 mmol/L (ref 22–32)
Calcium: 8 mg/dL — ABNORMAL LOW (ref 8.9–10.3)
Chloride: 103 mmol/L (ref 98–111)
Creatinine, Ser: 1.11 mg/dL — ABNORMAL HIGH (ref 0.44–1.00)
GFR, Estimated: 52 mL/min — ABNORMAL LOW (ref >60)
Glucose, Bld: 120 mg/dL — ABNORMAL HIGH (ref 70–99)
Potassium: 3.8 mmol/L (ref 3.5–5.1)
Sodium: 134 mmol/L — ABNORMAL LOW (ref 135–145)

## 2022-09-30 LAB — GLUCOSE, CAPILLARY: Glucose-Capillary: 118 mg/dL — ABNORMAL HIGH (ref 70–99)

## 2022-09-30 MED ORDER — IPRATROPIUM-ALBUTEROL 0.5-2.5 (3) MG/3ML IN SOLN: 3.0000 mL | RESPIRATORY_TRACT | Status: DC | PRN

## 2022-09-30 MED ORDER — METOPROLOL TARTRATE 5 MG/5ML IV SOLN: 5.0000 mg | INTRAVENOUS | Status: DC | PRN

## 2022-09-30 MED ORDER — TRAZODONE HCL 50 MG PO TABS: 50.0000 mg | ORAL_TABLET | Freq: Every evening | ORAL | Status: DC | PRN

## 2022-09-30 MED ORDER — GUAIFENESIN 100 MG/5ML PO LIQD: 5.0000 mL | ORAL | Status: DC | PRN

## 2022-09-30 MED ORDER — GABAPENTIN 300 MG PO CAPS
600.0000 mg | ORAL_CAPSULE | Freq: Every day | ORAL | Status: DC
  Administered 2022-09-30 – 2022-10-02 (×3): 600 mg via ORAL
  Filled 2022-09-30 (×3): qty 2

## 2022-09-30 MED ORDER — SENNOSIDES-DOCUSATE SODIUM 8.6-50 MG PO TABS: 1.0000 | ORAL_TABLET | Freq: Every evening | ORAL | Status: DC | PRN

## 2022-09-30 MED ORDER — GABAPENTIN 300 MG PO CAPS
300.0000 mg | ORAL_CAPSULE | Freq: Two times a day (BID) | ORAL | Status: DC
  Administered 2022-10-01 – 2022-10-03 (×6): 300 mg via ORAL
  Filled 2022-09-30 (×6): qty 1

## 2022-09-30 MED ORDER — HYDRALAZINE HCL 20 MG/ML IJ SOLN: 10.0000 mg | INTRAMUSCULAR | Status: DC | PRN

## 2022-09-30 NOTE — Progress Notes (Signed)
PROGRESS NOTE    Tracey Evans  ZOX:[id number] DOB: 05/06/1945 DOA: 09/29/2022 PCP: Sheliah Hatch, MD   Brief Narrative:  77 y.o. female with medical history significant of history of chest pain, asthma, hyperlipidemia, hypertension who presented to the emergency department with severe abdominal pain.  CT scan shows evidence of acute small bowel diverticulitis with focal perforation.  General surgery consulted, started on IV antibiotic.   Assessment & Plan:  Active Problems:   Diverticulitis     Acute small bowel diverticulitis with focal perforation - CT scan reviewed.  General surgery consulted.  Overall Tracey Evans is nontoxic-appearing.  Conservative management with IV fluids, Zosyn.  Advance diet as tolerated.  Essential hypertension - Norvasc, IV as needed  Generalized anxiety - Wellbutrin  GERD - PPI  History of CAD Hyperlipidemia -Currently chest pain-free.  Aspirin and statin  Hypothyroidism - Synthroid      DVT prophylaxis: SCDs Start: 09/29/22 1531 Code Status: Full code Family Communication: Daughter at bedside Poor oral tolerance.  Continue hospital stay       Diet Orders (From admission, onward)     Start     Ordered   09/30/22 0810  Diet clear liquid Room service appropriate? Yes; Fluid consistency: Thin  Diet effective now       Question Answer Comment  Room service appropriate? Yes   Fluid consistency: Thin      09/30/22 0809            Subjective: Still has poor oral tolerance with abdominal pain.  She tried to eat regular diet but that induced her abdominal pain   Examination:  General exam: Appears calm and comfortable  Respiratory system: Clear to auscultation. Respiratory effort normal. Cardiovascular system: S1 & S2 heard, RRR. No JVD, murmurs, rubs, gallops or clicks. No pedal edema. Gastrointestinal system: Abdomen is still slightly tender to deep palpation on the left side. Central nervous system: Alert and  oriented. No focal neurological deficits. Extremities: Symmetric 5 x 5 power. Skin: No rashes, lesions or ulcers Psychiatry: Judgement and insight appear normal. Mood & affect appropriate.  Objective: Vitals:   09/29/22 1655 09/29/22 2026 09/30/22 0107 09/30/22 0524  BP: (!) 145/72 97/60 (!) 88/53 108/62  Pulse: 89 88 80 80  Resp:  17 16 17   Temp: 99.1 F (37.3 C) 99.6 F (37.6 C) 99.3 F (37.4 C) 100.2 F (37.9 C)  TempSrc: Oral Oral Oral Oral  SpO2: 95% 90% 92% 91%  Weight:      Height:        Intake/Output Summary (Last 24 hours) at 09/30/2022 0814 Last data filed at 09/29/2022 2032 Gross per 24 hour  Intake --  Output 100 ml  Net -100 ml   Filed Weights   09/29/22 1154  Weight: 85.4 kg    Scheduled Meds:  amLODipine  5 mg Oral Daily   aspirin EC  81 mg Oral Daily   atorvastatin  20 mg Oral q1800   buPROPion  150 mg Oral Daily   levothyroxine  75 mcg Oral Q0600   pantoprazole  40 mg Oral Daily   traZODone  50 mg Oral QHS   Continuous Infusions:  lactated ringers 100 mL/hr at 09/30/22 0159   piperacillin-tazobactam (ZOSYN)  IV 3.375 g (09/30/22 0110)    Nutritional status     Body mass index is 32.33 kg/m.  Data Reviewed:   CBC: Recent Labs  Lab 09/29/22 1222  WBC 11.6*  NEUTROABS 9.2*  HGB 13.9  HCT 43.6  MCV 95.8  PLT 289   Basic Metabolic Panel: Recent Labs  Lab 09/29/22 1222  NA 136  K 4.0  CL 101  CO2 27  GLUCOSE 99  BUN 15  CREATININE 1.01*  CALCIUM 8.9   GFR: Estimated Creatinine Clearance: 50.1 mL/min (A) (by C-G formula based on SCr of 1.01 mg/dL (H)). Liver Function Tests: Recent Labs  Lab 09/29/22 1222  AST 23  ALT 25  ALKPHOS 98  BILITOT 1.1  PROT 7.2  ALBUMIN 3.9   Recent Labs  Lab 09/29/22 1222  LIPASE 24   No results for input(s): "AMMONIA" in the last 168 hours. Coagulation Profile: No results for input(s): "INR", "PROTIME" in the last 168 hours. Cardiac Enzymes: No results for input(s): "CKTOTAL",  "CKMB", "CKMBINDEX", "TROPONINI" in the last 168 hours. BNP (last 3 results) No results for input(s): "PROBNP" in the last 8760 hours. HbA1C: No results for input(s): "HGBA1C" in the last 72 hours. CBG: No results for input(s): "GLUCAP" in the last 168 hours. Lipid Profile: No results for input(s): "CHOL", "HDL", "LDLCALC", "TRIG", "CHOLHDL", "LDLDIRECT" in the last 72 hours. Thyroid Function Tests: No results for input(s): "TSH", "T4TOTAL", "FREET4", "T3FREE", "THYROIDAB" in the last 72 hours. Anemia Panel: No results for input(s): "VITAMINB12", "FOLATE", "FERRITIN", "TIBC", "IRON", "RETICCTPCT" in the last 72 hours. Sepsis Labs: No results for input(s): "PROCALCITON", "LATICACIDVEN" in the last 168 hours.  No results found for this or any previous visit (from the past 240 hour(s)).       Radiology Studies: CT ABDOMEN PELVIS WO CONTRAST  Result Date: 09/29/2022 CLINICAL DATA:  Abdominal pain. EXAM: CT ABDOMEN AND PELVIS WITHOUT CONTRAST TECHNIQUE: Multidetector CT imaging of the abdomen and pelvis was performed following the standard protocol without IV contrast. RADIATION DOSE REDUCTION: This exam was performed according to the departmental dose-optimization program which includes automated exposure control, adjustment of the mA and/or kV according to Tracey Evans size and/or use of iterative reconstruction technique. COMPARISON:  10/05/2018 FINDINGS: Lower chest: No acute abnormality. Hepatobiliary: No focal liver abnormality is seen. Status post cholecystectomy. No biliary dilatation. Pancreas: Unremarkable. No pancreatic ductal dilatation or surrounding inflammatory changes. Spleen: Normal in size without focal abnormality. Adrenals/Urinary Tract: Normal adrenal glands. Left kidney cysts are identified within the upper pole. The largest measures 2.5 cm. No follow-up imaging recommended. No nephrolithiasis or obstructive uropathy. Urinary bladder appears normal. Stomach/Bowel: Stomach appears  normal. The appendix is visualized and appears normal. Distal small bowel diverticulosis identified. Acute small bowel diverticulitis is identified with signs of focally contained perforation. Adjacent to the small bowel is a collection of extraluminal gas measuring 2.3 x 2.0 cm, image 57/2. There is surrounding inflammatory fat stranding noted. No signs of free perforation identified. No discrete abscess identified at this time. Diffuse colonic diverticulosis is also identified without signs of acute diverticulitis. No signs of bowel obstruction. Vascular/Lymphatic: Aortic atherosclerosis. No signs of abdominopelvic adenopathy. Reproductive: Status post hysterectomy. No adnexal masses. Other: No significant free fluid.  No abscess identified. Musculoskeletal: No acute or significant osseous findings. Signs of previous right mastectomy. IMPRESSION: 1. Examination is positive for acute small bowel diverticulitis with signs of focally contained perforation. No signs of free perforation identified. No discrete abscess identified at this time. 2. Diffuse colonic diverticulosis without signs of acute diverticulitis. 3.  Aortic Atherosclerosis (ICD10-I70.0). Electronically Signed   By: Signa Kell M.D.   On: 09/29/2022 14:52           LOS: 0 days   Time spent= 35 mins  Bakari Nikolai Joline Maxcy, MD [facility]  If 7PM-7AM, please contact night-coverage  09/30/2022, 8:14 AM

## 2022-09-30 NOTE — Progress Notes (Signed)
Subjective/Chief Complaint: Was feeling less pain until regular diet started   Objective: Vital signs in last 24 hours: Temp:  [97.8 F (36.6 C)-100.2 F (37.9 C)] 100.2 F (37.9 C) (06/23 0524) Pulse Rate:  [75-91] 80 (06/23 0524) Resp:  [12-22] 17 (06/23 0524) BP: (88-145)/(53-82) 108/62 (06/23 0524) SpO2:  [90 %-100 %] 91 % (06/23 0524) Weight:  [85.4 kg] 85.4 kg (06/22 1154) Last BM Date : 09/29/22  Intake/Output from previous day: 06/22 0701 - 06/23 0700 In: -  Out: 100 [Urine:100] Intake/Output this shift: No intake/output data recorded.  Exam: Awake and alert Abdomen soft, tenderness less but still with guarding  Lab Results:  Recent Labs    09/29/22 1222  WBC 11.6*  HGB 13.9  HCT 43.6  PLT 289   BMET Recent Labs    09/29/22 1222  NA 136  K 4.0  CL 101  CO2 27  GLUCOSE 99  BUN 15  CREATININE 1.01*  CALCIUM 8.9   PT/INR No results for input(s): "LABPROT", "INR" in the last 72 hours. ABG No results for input(s): "PHART", "HCO3" in the last 72 hours.  Invalid input(s): "PCO2", "PO2"  Studies/Results: CT ABDOMEN PELVIS WO CONTRAST  Result Date: 09/29/2022 CLINICAL DATA:  Abdominal pain. EXAM: CT ABDOMEN AND PELVIS WITHOUT CONTRAST TECHNIQUE: Multidetector CT imaging of the abdomen and pelvis was performed following the standard protocol without IV contrast. RADIATION DOSE REDUCTION: This exam was performed according to the departmental dose-optimization program which includes automated exposure control, adjustment of the mA and/or kV according to patient size and/or use of iterative reconstruction technique. COMPARISON:  10/05/2018 FINDINGS: Lower chest: No acute abnormality. Hepatobiliary: No focal liver abnormality is seen. Status post cholecystectomy. No biliary dilatation. Pancreas: Unremarkable. No pancreatic ductal dilatation or surrounding inflammatory changes. Spleen: Normal in size without focal abnormality. Adrenals/Urinary Tract: Normal  adrenal glands. Left kidney cysts are identified within the upper pole. The largest measures 2.5 cm. No follow-up imaging recommended. No nephrolithiasis or obstructive uropathy. Urinary bladder appears normal. Stomach/Bowel: Stomach appears normal. The appendix is visualized and appears normal. Distal small bowel diverticulosis identified. Acute small bowel diverticulitis is identified with signs of focally contained perforation. Adjacent to the small bowel is a collection of extraluminal gas measuring 2.3 x 2.0 cm, image 57/2. There is surrounding inflammatory fat stranding noted. No signs of free perforation identified. No discrete abscess identified at this time. Diffuse colonic diverticulosis is also identified without signs of acute diverticulitis. No signs of bowel obstruction. Vascular/Lymphatic: Aortic atherosclerosis. No signs of abdominopelvic adenopathy. Reproductive: Status post hysterectomy. No adnexal masses. Other: No significant free fluid.  No abscess identified. Musculoskeletal: No acute or significant osseous findings. Signs of previous right mastectomy. IMPRESSION: 1. Examination is positive for acute small bowel diverticulitis with signs of focally contained perforation. No signs of free perforation identified. No discrete abscess identified at this time. 2. Diffuse colonic diverticulosis without signs of acute diverticulitis. 3.  Aortic Atherosclerosis (ICD10-I70.0). Electronically Signed   By: Signa Kell M.D.   On: 09/29/2022 14:52    Anti-infectives: Anti-infectives (From admission, onward)    Start     Dose/Rate Route Frequency Ordered Stop   09/29/22 2200  piperacillin-tazobactam (ZOSYN) IVPB 3.375 g  Status:  Discontinued        3.375 g 100 mL/hr over 30 Minutes Intravenous Every 8 hours 09/29/22 1718 09/29/22 1722   09/29/22 1815  piperacillin-tazobactam (ZOSYN) IVPB 3.375 g        3.375 g 12.5 mL/hr over  240 Minutes Intravenous Every 8 hours 09/29/22 1722     09/29/22  1500  piperacillin-tazobactam (ZOSYN) IVPB 3.375 g        3.375 g 100 mL/hr over 30 Minutes Intravenous  Once 09/29/22 1457 09/29/22 1551       Assessment/Plan: Small bowel diverticulitis with microperforation  Labs pending this morning As this is a small bowel perforation, I have backed her back down to clear liquids and would not advance further for now Continue IV antibiotics   LOS: 0 days    Abigail Miyamoto 09/30/2022

## 2022-10-01 ENCOUNTER — Encounter (HOSPITAL_COMMUNITY): Payer: Self-pay | Admitting: Internal Medicine

## 2022-10-01 DIAGNOSIS — K5792 Diverticulitis of intestine, part unspecified, without perforation or abscess without bleeding: Secondary | ICD-10-CM | POA: Diagnosis not present

## 2022-10-01 LAB — BASIC METABOLIC PANEL
Anion gap: 9 (ref 5–15)
BUN: 10 mg/dL (ref 8–23)
CO2: 25 mmol/L (ref 22–32)
Calcium: 8.5 mg/dL — ABNORMAL LOW (ref 8.9–10.3)
Chloride: 104 mmol/L (ref 98–111)
Creatinine, Ser: 0.95 mg/dL (ref 0.44–1.00)
GFR, Estimated: >60 mL/min (ref >60)
Glucose, Bld: 104 mg/dL — ABNORMAL HIGH (ref 70–99)
Potassium: 4.2 mmol/L (ref 3.5–5.1)
Sodium: 138 mmol/L (ref 135–145)

## 2022-10-01 LAB — CBC
HCT: 35 % — ABNORMAL LOW (ref 36.0–46.0)
Hemoglobin: 11.1 g/dL — ABNORMAL LOW (ref 12.0–15.0)
MCH: 30.7 pg (ref 26.0–34.0)
MCHC: 31.7 g/dL (ref 30.0–36.0)
MCV: 96.7 fL (ref 80.0–100.0)
Platelets: 234 10*3/uL (ref 150–400)
RBC: 3.62 MIL/uL — ABNORMAL LOW (ref 3.87–5.11)
RDW: 13.4 % (ref 11.5–15.5)
WBC: 11.9 10*3/uL — ABNORMAL HIGH (ref 4.0–10.5)
nRBC: 0 % (ref 0.0–0.2)

## 2022-10-01 LAB — GLUCOSE, CAPILLARY
Glucose-Capillary: 102 mg/dL — ABNORMAL HIGH (ref 70–99)
Glucose-Capillary: 112 mg/dL — ABNORMAL HIGH (ref 70–99)
Glucose-Capillary: 93 mg/dL (ref 70–99)
Glucose-Capillary: 94 mg/dL (ref 70–99)

## 2022-10-01 LAB — MAGNESIUM: Magnesium: 2 mg/dL (ref 1.7–2.4)

## 2022-10-01 NOTE — Progress Notes (Signed)
Subjective: CC: Feeling much better. Reports her pain was previously a 10/10 and generalized. Now her pain is 3/10, located in her supraumbilical to left mid abdomen. Pain is not worsened by coughing like it was on presentation. Tolerating cld without n/v or worsening pain. Passing flatus. Last bm 6/22.   Reports last colonoscopy was in Nov 2023. I am getting an error when trying to look at this report. She reports it was normal other than 1 polyp that was benign.   Follows with Dr. Sharyn Lull of cardiology. Reports she can walk 2-3 blocks without cp or sob. Ambulation most limited by knee pain. She is not on blood thinners.  Hx of Lap chole and hysterectomy.   Objective: Vital signs in last 24 hours: Temp:  [98.3 F (36.8 C)-99.5 F (37.5 C)] 99.5 F (37.5 C) (06/24 0414) Pulse Rate:  [78-82] 80 (06/24 0414) Resp:  [10-18] 18 (06/24 0414) BP: (104-146)/(63-92) 104/63 (06/24 0414) SpO2:  [90 %-94 %] 90 % (06/24 0414) Last BM Date : 09/29/22  Intake/Output from previous day: 06/23 0701 - 06/24 0700 In: 2423.4 [P.O.:320; I.V.:1867.7; IV Piggyback:235.6] Out: -  Intake/Output this shift: No intake/output data recorded.  PE: Gen:  Alert, NAD, pleasant Abd: Soft, question mild distension, mild suprapubic and left mid abdominal ttp without rigidity or guarding. +BS  Lab Results:  Recent Labs    09/30/22 1014 10/01/22 0459  WBC 12.2* 11.9*  HGB 11.2* 11.1*  HCT 35.3* 35.0*  PLT 225 234   BMET Recent Labs    09/30/22 1014 10/01/22 0459  NA 134* 138  K 3.8 4.2  CL 103 104  CO2 25 25  GLUCOSE 120* 104*  BUN 15 10  CREATININE 1.11* 0.95  CALCIUM 8.0* 8.5*   PT/INR No results for input(s): "LABPROT", "INR" in the last 72 hours. CMP     Component Value Date/Time   NA 138 10/01/2022 0459   NA 147 (H) 06/01/2021 1007   NA 143 11/30/2016 0904   K 4.2 10/01/2022 0459   K 4.2 11/30/2016 0904   CL 104 10/01/2022 0459   CO2 25 10/01/2022 0459   CO2 25  11/30/2016 0904   GLUCOSE 104 (H) 10/01/2022 0459   GLUCOSE 127 11/30/2016 0904   BUN 10 10/01/2022 0459   BUN 15 06/01/2021 1007   BUN 17.3 11/30/2016 0904   CREATININE 0.95 10/01/2022 0459   CREATININE 1.1 11/30/2016 0904   CALCIUM 8.5 (L) 10/01/2022 0459   CALCIUM 9.7 11/30/2016 0904   PROT 7.2 09/29/2022 1222   PROT 6.9 06/01/2021 1007   PROT 6.7 11/30/2016 0904   ALBUMIN 3.9 09/29/2022 1222   ALBUMIN 4.4 06/01/2021 1007   ALBUMIN 3.6 11/30/2016 0904   AST 23 09/29/2022 1222   AST 16 11/30/2016 0904   ALT 25 09/29/2022 1222   ALT 21 11/30/2016 0904   ALKPHOS 98 09/29/2022 1222   ALKPHOS 102 11/30/2016 0904   BILITOT 1.1 09/29/2022 1222   BILITOT 0.3 06/01/2021 1007   BILITOT 0.49 11/30/2016 0904   GFRNONAA >60 10/01/2022 0459   GFRAA 59 (L) 10/06/2018 0500   Lipase     Component Value Date/Time   LIPASE 24 09/29/2022 1222    Studies/Results: CT ABDOMEN PELVIS WO CONTRAST  Result Date: 09/29/2022 CLINICAL DATA:  Abdominal pain. EXAM: CT ABDOMEN AND PELVIS WITHOUT CONTRAST TECHNIQUE: Multidetector CT imaging of the abdomen and pelvis was performed following the standard protocol without IV contrast. RADIATION DOSE REDUCTION: This exam was  performed according to the departmental dose-optimization program which includes automated exposure control, adjustment of the mA and/or kV according to patient size and/or use of iterative reconstruction technique. COMPARISON:  10/05/2018 FINDINGS: Lower chest: No acute abnormality. Hepatobiliary: No focal liver abnormality is seen. Status post cholecystectomy. No biliary dilatation. Pancreas: Unremarkable. No pancreatic ductal dilatation or surrounding inflammatory changes. Spleen: Normal in size without focal abnormality. Adrenals/Urinary Tract: Normal adrenal glands. Left kidney cysts are identified within the upper pole. The largest measures 2.5 cm. No follow-up imaging recommended. No nephrolithiasis or obstructive uropathy. Urinary  bladder appears normal. Stomach/Bowel: Stomach appears normal. The appendix is visualized and appears normal. Distal small bowel diverticulosis identified. Acute small bowel diverticulitis is identified with signs of focally contained perforation. Adjacent to the small bowel is a collection of extraluminal gas measuring 2.3 x 2.0 cm, image 57/2. There is surrounding inflammatory fat stranding noted. No signs of free perforation identified. No discrete abscess identified at this time. Diffuse colonic diverticulosis is also identified without signs of acute diverticulitis. No signs of bowel obstruction. Vascular/Lymphatic: Aortic atherosclerosis. No signs of abdominopelvic adenopathy. Reproductive: Status post hysterectomy. No adnexal masses. Other: No significant free fluid.  No abscess identified. Musculoskeletal: No acute or significant osseous findings. Signs of previous right mastectomy. IMPRESSION: 1. Examination is positive for acute small bowel diverticulitis with signs of focally contained perforation. No signs of free perforation identified. No discrete abscess identified at this time. 2. Diffuse colonic diverticulosis without signs of acute diverticulitis. 3.  Aortic Atherosclerosis (ICD10-I70.0). Electronically Signed   By: Signa Kell M.D.   On: 09/29/2022 14:52    Anti-infectives: Anti-infectives (From admission, onward)    Start     Dose/Rate Route Frequency Ordered Stop   09/29/22 2200  piperacillin-tazobactam (ZOSYN) IVPB 3.375 g  Status:  Discontinued        3.375 g 100 mL/hr over 30 Minutes Intravenous Every 8 hours 09/29/22 1718 09/29/22 1722   09/29/22 1815  piperacillin-tazobactam (ZOSYN) IVPB 3.375 g        3.375 g 12.5 mL/hr over 240 Minutes Intravenous Every 8 hours 09/29/22 1722     09/29/22 1500  piperacillin-tazobactam (ZOSYN) IVPB 3.375 g        3.375 g 100 mL/hr over 30 Minutes Intravenous  Once 09/29/22 1457 09/29/22 1551        Assessment/Plan Small bowel  diverticulitis with contained perforation - CT 6/22 w/ small bowel diverticulitis with contained perforation (collection of extraluminal gas measuring 2.3 x 2.0 cm, with surrounding inflammatory fat stranding noted). No signs of free perforation identified. No discrete abscess identified at this time. No significant free fluid  - HDS. Afebrile without tachcyardia or hypotension. WBC downtrending at 11.9. Cr improving. Symptomatically improving. Exam without peritonitis. No indication for emergency surgery  - Will continue with conservative management given improvement as noted above. Cont abx. Adv to FLD today. If patient was to worsen, low threshold for OR  - We will follow with you  FEN - FLD, IVF per [facility] VTE - SCDs, okay for chem ppx from our standpoint ID - Zosyn  Hx HTN Hx CAD - NM scan 2020 w/ No reversible ischemia or infarction, normal left ventricular wall motion, LV EF 66% and low non invasive risk stratification. Reports she can walk 2-3 blocks without cp or sob.  Hx Hypothyroidism Hx R breast CA s/p mastectomy, chemo, and anti-estrogen tx followed by Dr. Pamelia Hoit. Reports she is now off anti-estrogen tx   I reviewed nursing notes, hospitalist  notes, last 24 h vitals and pain scores, last 48 h intake and output, last 24 h labs and trends, and last 24 h imaging results.   LOS: 1 day    Jacinto Halim , [facility] 10/01/2022, 7:39 AM Please see Amion for pager number during day hours 7:00am-4:30pm

## 2022-10-01 NOTE — TOC CM/SW Note (Signed)
Transition of Care [facility]) - Inpatient Brief Assessment   Patient Details  Name: Tracey Evans MRN: [id number] Date of Birth: 1945/06/20  [facility] CM/SW Contact:    Armanda Heritage, RN Phone Number: 10/01/2022, 10:49 AM   Clinical Narrative: Patient from home, has pcp, and remains on room air.  No TOC needs have been identified at this time.  Anticipate dc to home once medically stable.   Transition of Care Asessment: Insurance and Status: Insurance coverage has been reviewed Patient has primary care physician: Yes Home environment has been reviewed: from home Prior level of function:: independent Prior/Current Home Services: No current home services Social Determinants of Health Reivew: SDOH reviewed no interventions necessary Readmission risk has been reviewed: Yes Transition of care needs: no transition of care needs at this time

## 2022-10-01 NOTE — Progress Notes (Signed)
PROGRESS NOTE    Tracey Evans  ZOX:[id number] DOB: December 11, 1945 DOA: 09/29/2022 PCP: Sheliah Hatch, MD   Brief Narrative:  77 y.o. female with medical history significant of history of chest pain, asthma, hyperlipidemia, hypertension who presented to the emergency department with severe abdominal pain.  CT scan shows evidence of acute small bowel diverticulitis with focal perforation.  General surgery consulted, started on IV antibiotic.   Assessment & Plan:  Principal Problem:   Acute diverticulitis Active Problems:   Diverticulitis     Acute small bowel diverticulitis with focal perforation - CT scan reviewed.  General surgery following, overall nontoxic-appearing.  Planning to advance diet to full liquids.  Continue Zosyn  Essential hypertension - Norvasc, IV as needed  Generalized anxiety - Wellbutrin  GERD - PPI  History of CAD Hyperlipidemia -Currently chest pain-free.  Aspirin and statin  Hypothyroidism - Synthroid      DVT prophylaxis: SCDs Start: 09/29/22 1531 Code Status: Full code Family Communication: Daughter at bedside Poor oral tolerance.  Continue hospital stay       Diet Orders (From admission, onward)     Start     Ordered   10/01/22 0801  Diet full liquid Fluid consistency: Thin  Diet effective now       Question:  Fluid consistency:  Answer:  Thin   10/01/22 0800            Subjective: Doing slightly better compared to yesterday.  Less nausea and vomiting  Examination: Constitutional: Not in acute distress Respiratory: Clear to auscultation bilaterally Cardiovascular: Normal sinus rhythm, no rubs Abdomen: Nontender nondistended good bowel sounds Musculoskeletal: No edema noted Skin: No rashes seen Neurologic: CN 2-12 grossly intact.  And nonfocal Psychiatric: Normal judgment and insight. Alert and oriented x 3. Normal mood.  Objective: Vitals:   09/30/22 0923 09/30/22 1300 09/30/22 2116 10/01/22 0414  BP: (!)  143/69 (!) 146/92 110/72 104/63  Pulse: 78 82 78 80  Resp:  10 16 18   Temp: 98.3 F (36.8 C) 98.5 F (36.9 C) 98.4 F (36.9 C) 99.5 F (37.5 C)  TempSrc: Oral Oral Oral Oral  SpO2: 91% 94% 93% 90%  Weight:      Height:        Intake/Output Summary (Last 24 hours) at 10/01/2022 0817 Last data filed at 10/01/2022 0536 Gross per 24 hour  Intake 2423.36 ml  Output --  Net 2423.36 ml   Filed Weights   09/29/22 1154  Weight: 85.4 kg    Scheduled Meds:  amLODipine  5 mg Oral Daily   aspirin EC  81 mg Oral Daily   atorvastatin  20 mg Oral q1800   buPROPion  150 mg Oral Daily   gabapentin  300 mg Oral BID   gabapentin  600 mg Oral QHS   levothyroxine  75 mcg Oral Q0600   pantoprazole  40 mg Oral Daily   traZODone  50 mg Oral QHS   Continuous Infusions:  lactated ringers 100 mL/hr at 10/01/22 0636   piperacillin-tazobactam (ZOSYN)  IV 12.5 mL/hr at 10/01/22 0536    Nutritional status     Body mass index is 32.33 kg/m.  Data Reviewed:   CBC: Recent Labs  Lab 09/29/22 1222 09/30/22 1014 10/01/22 0459  WBC 11.6* 12.2* 11.9*  NEUTROABS 9.2*  --   --   HGB 13.9 11.2* 11.1*  HCT 43.6 35.3* 35.0*  MCV 95.8 96.2 96.7  PLT 289 225 234   Basic Metabolic Panel: Recent Labs  Lab 09/29/22 1222 09/30/22 1014 10/01/22 0459  NA 136 134* 138  K 4.0 3.8 4.2  CL 101 103 104  CO2 27 25 25   GLUCOSE 99 120* 104*  BUN 15 15 10   CREATININE 1.01* 1.11* 0.95  CALCIUM 8.9 8.0* 8.5*  MG  --   --  2.0   GFR: Estimated Creatinine Clearance: 53.3 mL/min (by C-G formula based on SCr of 0.95 mg/dL). Liver Function Tests: Recent Labs  Lab 09/29/22 1222  AST 23  ALT 25  ALKPHOS 98  BILITOT 1.1  PROT 7.2  ALBUMIN 3.9   Recent Labs  Lab 09/29/22 1222  LIPASE 24   No results for input(s): "AMMONIA" in the last 168 hours. Coagulation Profile: No results for input(s): "INR", "PROTIME" in the last 168 hours. Cardiac Enzymes: No results for input(s): "CKTOTAL", "CKMB",  "CKMBINDEX", "TROPONINI" in the last 168 hours. BNP (last 3 results) No results for input(s): "PROBNP" in the last 8760 hours. HbA1C: No results for input(s): "HGBA1C" in the last 72 hours. CBG: Recent Labs  Lab 09/30/22 1319 10/01/22 0022 10/01/22 0525  GLUCAP 118* 102* 93   Lipid Profile: No results for input(s): "CHOL", "HDL", "LDLCALC", "TRIG", "CHOLHDL", "LDLDIRECT" in the last 72 hours. Thyroid Function Tests: No results for input(s): "TSH", "T4TOTAL", "FREET4", "T3FREE", "THYROIDAB" in the last 72 hours. Anemia Panel: No results for input(s): "VITAMINB12", "FOLATE", "FERRITIN", "TIBC", "IRON", "RETICCTPCT" in the last 72 hours. Sepsis Labs: No results for input(s): "PROCALCITON", "LATICACIDVEN" in the last 168 hours.  No results found for this or any previous visit (from the past 240 hour(s)).       Radiology Studies: CT ABDOMEN PELVIS WO CONTRAST  Result Date: 09/29/2022 CLINICAL DATA:  Abdominal pain. EXAM: CT ABDOMEN AND PELVIS WITHOUT CONTRAST TECHNIQUE: Multidetector CT imaging of the abdomen and pelvis was performed following the standard protocol without IV contrast. RADIATION DOSE REDUCTION: This exam was performed according to the departmental dose-optimization program which includes automated exposure control, adjustment of the mA and/or kV according to patient size and/or use of iterative reconstruction technique. COMPARISON:  10/05/2018 FINDINGS: Lower chest: No acute abnormality. Hepatobiliary: No focal liver abnormality is seen. Status post cholecystectomy. No biliary dilatation. Pancreas: Unremarkable. No pancreatic ductal dilatation or surrounding inflammatory changes. Spleen: Normal in size without focal abnormality. Adrenals/Urinary Tract: Normal adrenal glands. Left kidney cysts are identified within the upper pole. The largest measures 2.5 cm. No follow-up imaging recommended. No nephrolithiasis or obstructive uropathy. Urinary bladder appears normal.  Stomach/Bowel: Stomach appears normal. The appendix is visualized and appears normal. Distal small bowel diverticulosis identified. Acute small bowel diverticulitis is identified with signs of focally contained perforation. Adjacent to the small bowel is a collection of extraluminal gas measuring 2.3 x 2.0 cm, image 57/2. There is surrounding inflammatory fat stranding noted. No signs of free perforation identified. No discrete abscess identified at this time. Diffuse colonic diverticulosis is also identified without signs of acute diverticulitis. No signs of bowel obstruction. Vascular/Lymphatic: Aortic atherosclerosis. No signs of abdominopelvic adenopathy. Reproductive: Status post hysterectomy. No adnexal masses. Other: No significant free fluid.  No abscess identified. Musculoskeletal: No acute or significant osseous findings. Signs of previous right mastectomy. IMPRESSION: 1. Examination is positive for acute small bowel diverticulitis with signs of focally contained perforation. No signs of free perforation identified. No discrete abscess identified at this time. 2. Diffuse colonic diverticulosis without signs of acute diverticulitis. 3.  Aortic Atherosclerosis (ICD10-I70.0). Electronically Signed   By: Signa Kell M.D.   On:  09/29/2022 14:52           LOS: 1 day   Time spent= 35 mins    Lasonja Lakins Joline Maxcy, MD [facility]  If 7PM-7AM, please contact night-coverage  10/01/2022, 8:17 AM

## 2022-10-02 DIAGNOSIS — K5792 Diverticulitis of intestine, part unspecified, without perforation or abscess without bleeding: Secondary | ICD-10-CM | POA: Diagnosis not present

## 2022-10-02 LAB — BASIC METABOLIC PANEL
Anion gap: 10 (ref 5–15)
BUN: 8 mg/dL (ref 8–23)
CO2: 25 mmol/L (ref 22–32)
Calcium: 8.8 mg/dL — ABNORMAL LOW (ref 8.9–10.3)
Chloride: 105 mmol/L (ref 98–111)
Creatinine, Ser: 0.99 mg/dL (ref 0.44–1.00)
GFR, Estimated: 59 mL/min — ABNORMAL LOW (ref >60)
Glucose, Bld: 94 mg/dL (ref 70–99)
Potassium: 4.2 mmol/L (ref 3.5–5.1)
Sodium: 140 mmol/L (ref 135–145)

## 2022-10-02 LAB — CBC
HCT: 35.2 % — ABNORMAL LOW (ref 36.0–46.0)
Hemoglobin: 11.4 g/dL — ABNORMAL LOW (ref 12.0–15.0)
MCH: 31.2 pg (ref 26.0–34.0)
MCHC: 32.4 g/dL (ref 30.0–36.0)
MCV: 96.4 fL (ref 80.0–100.0)
Platelets: 251 10*3/uL (ref 150–400)
RBC: 3.65 MIL/uL — ABNORMAL LOW (ref 3.87–5.11)
RDW: 13.2 % (ref 11.5–15.5)
WBC: 9 10*3/uL (ref 4.0–10.5)
nRBC: 0 % (ref 0.0–0.2)

## 2022-10-02 LAB — GLUCOSE, CAPILLARY
Glucose-Capillary: 111 mg/dL — ABNORMAL HIGH (ref 70–99)
Glucose-Capillary: 84 mg/dL (ref 70–99)

## 2022-10-02 LAB — MAGNESIUM: Magnesium: 1.9 mg/dL (ref 1.7–2.4)

## 2022-10-02 MED ORDER — DEXTROSE-SODIUM CHLORIDE 5-0.45 % IV SOLN: INTRAVENOUS | Status: DC

## 2022-10-02 MED ORDER — SODIUM CHLORIDE 0.9 % IV SOLN: INTRAVENOUS | Status: DC

## 2022-10-02 MED ORDER — POLYETHYLENE GLYCOL 3350 17 G PO PACK
17.0000 g | PACK | Freq: Two times a day (BID) | ORAL | Status: DC
  Administered 2022-10-02: 17 g via ORAL
  Filled 2022-10-02: qty 1

## 2022-10-02 MED ORDER — DOCUSATE SODIUM 50 MG/5ML PO LIQD
100.0000 mg | Freq: Two times a day (BID) | ORAL | Status: DC
  Administered 2022-10-02: 100 mg via ORAL
  Filled 2022-10-02: qty 10

## 2022-10-02 MED ORDER — MAGNESIUM CITRATE PO SOLN
0.5000 | Freq: Once | ORAL | Status: AC
  Administered 2022-10-02: 0.5 via ORAL
  Filled 2022-10-02: qty 296

## 2022-10-02 MED ORDER — MAGNESIUM HYDROXIDE 400 MG/5ML PO SUSP
30.0000 mL | Freq: Once | ORAL | Status: DC
  Filled 2022-10-02: qty 30

## 2022-10-02 MED ORDER — LACTATED RINGERS IV SOLN: INTRAVENOUS | Status: DC

## 2022-10-02 NOTE — Progress Notes (Signed)
Subjective: CC: Reports her supraumbilical to left mid abdominal pain has continued to improve. She didn't eat a lot of FLD yesterday (1/4 trays) as her abdomen would feel "sore" after. No n/v with po intake. Would have some nausea and dizziness with ambulation. No flatus or bm. Only prn medications yesterday taken were Tylenol - she reports this was for a HA and not for abdominal pain.   Updated her daughter over the phone.   Objective: Vital signs in last 24 hours: Temp:  [97.6 F (36.4 C)-97.9 F (36.6 C)] 97.6 F (36.4 C) (06/25 0551) Pulse Rate:  [64-73] 64 (06/25 1017) Resp:  [18-20] 20 (06/25 0551) BP: (90-119)/(57-80) 117/80 (06/25 1017) SpO2:  [93 %-98 %] 96 % (06/25 1017) Last BM Date : 09/29/22  Intake/Output from previous day: 06/24 0701 - 06/25 0700 In: 60.4 [IV Piggyback:60.4] Out: -  Intake/Output this shift: No intake/output data recorded.  PE: Gen:  Alert, NAD, pleasant Abd: Soft, stable mild distension, mild suprapubic and left mid abdominal ttp without rigidity or guarding - stable to slightly improved from yesterday +BS  Lab Results:  Recent Labs    10/01/22 0459 10/02/22 0545  WBC 11.9* 9.0  HGB 11.1* 11.4*  HCT 35.0* 35.2*  PLT 234 251    BMET Recent Labs    10/01/22 0459 10/02/22 0545  NA 138 140  K 4.2 4.2  CL 104 105  CO2 25 25  GLUCOSE 104* 94  BUN 10 8  CREATININE 0.95 0.99  CALCIUM 8.5* 8.8*    PT/INR No results for input(s): "LABPROT", "INR" in the last 72 hours. CMP     Component Value Date/Time   NA 140 10/02/2022 0545   NA 147 (H) 06/01/2021 1007   NA 143 11/30/2016 0904   K 4.2 10/02/2022 0545   K 4.2 11/30/2016 0904   CL 105 10/02/2022 0545   CO2 25 10/02/2022 0545   CO2 25 11/30/2016 0904   GLUCOSE 94 10/02/2022 0545   GLUCOSE 127 11/30/2016 0904   BUN 8 10/02/2022 0545   BUN 15 06/01/2021 1007   BUN 17.3 11/30/2016 0904   CREATININE 0.99 10/02/2022 0545   CREATININE 1.1 11/30/2016 0904   CALCIUM  8.8 (L) 10/02/2022 0545   CALCIUM 9.7 11/30/2016 0904   PROT 7.2 09/29/2022 1222   PROT 6.9 06/01/2021 1007   PROT 6.7 11/30/2016 0904   ALBUMIN 3.9 09/29/2022 1222   ALBUMIN 4.4 06/01/2021 1007   ALBUMIN 3.6 11/30/2016 0904   AST 23 09/29/2022 1222   AST 16 11/30/2016 0904   ALT 25 09/29/2022 1222   ALT 21 11/30/2016 0904   ALKPHOS 98 09/29/2022 1222   ALKPHOS 102 11/30/2016 0904   BILITOT 1.1 09/29/2022 1222   BILITOT 0.3 06/01/2021 1007   BILITOT 0.49 11/30/2016 0904   GFRNONAA 59 (L) 10/02/2022 0545   GFRAA 59 (L) 10/06/2018 0500   Lipase     Component Value Date/Time   LIPASE 24 09/29/2022 1222    Studies/Results: No results found.  Anti-infectives: Anti-infectives (From admission, onward)    Start     Dose/Rate Route Frequency Ordered Stop   09/29/22 2200  piperacillin-tazobactam (ZOSYN) IVPB 3.375 g  Status:  Discontinued        3.375 g 100 mL/hr over 30 Minutes Intravenous Every 8 hours 09/29/22 1718 09/29/22 1722   09/29/22 1815  piperacillin-tazobactam (ZOSYN) IVPB 3.375 g        3.375 g 12.5 mL/hr over 240 Minutes  Intravenous Every 8 hours 09/29/22 1722     09/29/22 1500  piperacillin-tazobactam (ZOSYN) IVPB 3.375 g        3.375 g 100 mL/hr over 30 Minutes Intravenous  Once 09/29/22 1457 09/29/22 1551        Assessment/Plan Small bowel diverticulitis with contained perforation - CT 6/22 w/ small bowel diverticulitis with contained perforation (collection of extraluminal gas measuring 2.3 x 2.0 cm, with surrounding inflammatory fat stranding noted). No signs of free perforation identified. No discrete abscess identified at this time. No significant free fluid  - HDS. Afebrile without tachcyardia or hypotension. WBC normalized.  Exam without peritonitis. No indication for emergency surgery  - Will continue with conservative management for now. Hold at FLD today. Cont abx. If patient was to worsen, low threshold for OR  - We will follow with you  FEN -  FLD, IVF per [facility] VTE - SCDs, okay for chem ppx from our standpoint ID - Zosyn  Hx HTN Hx CAD - NM scan 2020 w/ No reversible ischemia or infarction, normal left ventricular wall motion, LV EF 66% and low non invasive risk stratification. Reports she can walk 2-3 blocks without cp or sob.  Hx Hypothyroidism Hx R breast CA s/p mastectomy, chemo, and anti-estrogen tx followed by Dr. Pamelia Hoit. Reports she is now off anti-estrogen tx   I reviewed nursing notes, hospitalist notes, last 24 h vitals and pain scores, last 48 h intake and output, last 24 h labs and trends, and last 24 h imaging results.   LOS: 2 days    Jacinto Halim , [facility] 10/02/2022, 10:37 AM Please see Amion for pager number during day hours 7:00am-4:30pm

## 2022-10-02 NOTE — Progress Notes (Signed)
Mobility Specialist - Progress Note   10/02/22 1515  Mobility  Activity Ambulated independently in hallway  Level of Assistance Independent  Assistive Device None  Distance Ambulated (ft) 600 ft  Activity Response Tolerated well  Mobility Referral Yes  $Mobility charge 1 Mobility  Mobility Specialist Stop Time (ACUTE ONLY) 0314   Pt received in recliner and agreeable to mobility. No complaints during session. Pt to recliner after session with all needs met.    [facility]

## 2022-10-02 NOTE — Progress Notes (Signed)
Mobility Specialist - Progress Note   10/02/22 1140  Mobility  Activity Ambulated independently in hallway  Level of Assistance Independent  Assistive Device None  Distance Ambulated (ft) 1100 ft  Activity Response Tolerated well  Mobility Referral Yes  $Mobility charge 1 Mobility  Mobility Specialist Start Time (ACUTE ONLY) 1123  Mobility Specialist Stop Time (ACUTE ONLY) 1139  Mobility Specialist Time Calculation (min) (ACUTE ONLY) 16 min   Pt received in recliner and agreeable to mobility. No complaints during session. Pt to recliner after session with all needs met.    [facility]

## 2022-10-02 NOTE — Progress Notes (Signed)
PROGRESS NOTE    Tracey Evans  WUJ:[id number] DOB: May 21, 1945 DOA: 09/29/2022 PCP: Sheliah Hatch, MD   Brief Narrative:  77 y.o. female with medical history significant of history of chest pain, asthma, hyperlipidemia, hypertension who presented to the emergency department with severe abdominal pain.  CT scan shows evidence of acute small bowel diverticulitis with focal perforation.  General surgery consulted, started on IV antibiotic.  Currently slowly advancing diet as tolerated.   Assessment & Plan:  Principal Problem:   Acute diverticulitis Active Problems:   Diverticulitis     Acute small bowel diverticulitis with focal perforation - CT scan reviewed.  Overall nontoxic-appearing.  continue Zosyn, advance diet as tolerated.  Patient is very slow to improve therefore I discussed with general surgery to advance diet as they feel appropriate.  IV fluids because some report of dizziness due to dehydration  Essential hypertension - Norvasc, IV as needed  Generalized anxiety - Wellbutrin  GERD - PPI  History of CAD Hyperlipidemia -Currently chest pain-free.  Aspirin and statin  Hypothyroidism - Synthroid    DVT prophylaxis: SCDs Start: 09/29/22 1531 Code Status: Full code Family Communication:  Poor oral tolerance.  Continue hospital stay       Diet Orders (From admission, onward)     Start     Ordered   10/01/22 0801  Diet full liquid Fluid consistency: Thin  Diet effective now       Question:  Fluid consistency:  Answer:  Thin   10/01/22 0800            Subjective: Had some clears yesterday and still having abdominal pain.  Otherwise overall comfortable Some reports of dizziness secondary to dehydration.  Will give her IV fluids Examination: Constitutional: Not in acute distress Respiratory: Clear to auscultation bilaterally Cardiovascular: Normal sinus rhythm, no rubs Abdomen: Slightly tender to deep palpation.  Positive bowel  sounds Musculoskeletal: No edema noted Skin: No rashes seen Neurologic: CN 2-12 grossly intact.  And nonfocal Psychiatric: Normal judgment and insight. Alert and oriented x 3. Normal mood.  Objective: Vitals:   10/01/22 0834 10/01/22 1322 10/01/22 2022 10/02/22 0551  BP: 104/73 (!) 90/57 119/71 103/66  Pulse:  68 73 67  Resp:   18 20  Temp:  97.9 F (36.6 C) 97.6 F (36.4 C) 97.6 F (36.4 C)  TempSrc:  Oral Oral   SpO2:  93% 98% 98%  Weight:      Height:        Intake/Output Summary (Last 24 hours) at 10/02/2022 0906 Last data filed at 10/01/2022 1600 Gross per 24 hour  Intake 60.35 ml  Output --  Net 60.35 ml   Filed Weights   09/29/22 1154  Weight: 85.4 kg    Scheduled Meds:  amLODipine  5 mg Oral Daily   aspirin EC  81 mg Oral Daily   atorvastatin  20 mg Oral q1800   buPROPion  150 mg Oral Daily   gabapentin  300 mg Oral BID   gabapentin  600 mg Oral QHS   levothyroxine  75 mcg Oral Q0600   pantoprazole  40 mg Oral Daily   traZODone  50 mg Oral QHS   Continuous Infusions:  piperacillin-tazobactam (ZOSYN)  IV 3.375 g (10/02/22 0211)    Nutritional status     Body mass index is 32.33 kg/m.  Data Reviewed:   CBC: Recent Labs  Lab 09/29/22 1222 09/30/22 1014 10/01/22 0459 10/02/22 0545  WBC 11.6* 12.2* 11.9* 9.0  NEUTROABS  9.2*  --   --   --   HGB 13.9 11.2* 11.1* 11.4*  HCT 43.6 35.3* 35.0* 35.2*  MCV 95.8 96.2 96.7 96.4  PLT 289 225 234 251   Basic Metabolic Panel: Recent Labs  Lab 09/29/22 1222 09/30/22 1014 10/01/22 0459 10/02/22 0545  NA 136 134* 138 140  K 4.0 3.8 4.2 4.2  CL 101 103 104 105  CO2 27 25 25 25   GLUCOSE 99 120* 104* 94  BUN 15 15 10 8   CREATININE 1.01* 1.11* 0.95 0.99  CALCIUM 8.9 8.0* 8.5* 8.8*  MG  --   --  2.0 1.9   GFR: Estimated Creatinine Clearance: 51.1 mL/min (by C-G formula based on SCr of 0.99 mg/dL). Liver Function Tests: Recent Labs  Lab 09/29/22 1222  AST 23  ALT 25  ALKPHOS 98  BILITOT 1.1   PROT 7.2  ALBUMIN 3.9   Recent Labs  Lab 09/29/22 1222  LIPASE 24   No results for input(s): "AMMONIA" in the last 168 hours. Coagulation Profile: No results for input(s): "INR", "PROTIME" in the last 168 hours. Cardiac Enzymes: No results for input(s): "CKTOTAL", "CKMB", "CKMBINDEX", "TROPONINI" in the last 168 hours. BNP (last 3 results) No results for input(s): "PROBNP" in the last 8760 hours. HbA1C: No results for input(s): "HGBA1C" in the last 72 hours. CBG: Recent Labs  Lab 10/01/22 0022 10/01/22 0525 10/01/22 1150 10/01/22 2342 10/02/22 0548  GLUCAP 102* 93 112* 94 84   Lipid Profile: No results for input(s): "CHOL", "HDL", "LDLCALC", "TRIG", "CHOLHDL", "LDLDIRECT" in the last 72 hours. Thyroid Function Tests: No results for input(s): "TSH", "T4TOTAL", "FREET4", "T3FREE", "THYROIDAB" in the last 72 hours. Anemia Panel: No results for input(s): "VITAMINB12", "FOLATE", "FERRITIN", "TIBC", "IRON", "RETICCTPCT" in the last 72 hours. Sepsis Labs: No results for input(s): "PROCALCITON", "LATICACIDVEN" in the last 168 hours.  No results found for this or any previous visit (from the past 240 hour(s)).       Radiology Studies: No results found.         LOS: 2 days   Time spent= 35 mins    Mishaal Lansdale Joline Maxcy, MD [facility]  If 7PM-7AM, please contact night-coverage  10/02/2022, 9:06 AM

## 2022-10-03 DIAGNOSIS — K5792 Diverticulitis of intestine, part unspecified, without perforation or abscess without bleeding: Secondary | ICD-10-CM | POA: Diagnosis not present

## 2022-10-03 LAB — BASIC METABOLIC PANEL
Anion gap: 6 (ref 5–15)
BUN: 7 mg/dL — ABNORMAL LOW (ref 8–23)
CO2: 26 mmol/L (ref 22–32)
Calcium: 8.7 mg/dL — ABNORMAL LOW (ref 8.9–10.3)
Chloride: 107 mmol/L (ref 98–111)
Creatinine, Ser: 0.97 mg/dL (ref 0.44–1.00)
GFR, Estimated: >60 mL/min (ref >60)
Glucose, Bld: 94 mg/dL (ref 70–99)
Potassium: 4.1 mmol/L (ref 3.5–5.1)
Sodium: 139 mmol/L (ref 135–145)

## 2022-10-03 LAB — CBC
HCT: 34.7 % — ABNORMAL LOW (ref 36.0–46.0)
Hemoglobin: 11.3 g/dL — ABNORMAL LOW (ref 12.0–15.0)
MCH: 30.8 pg (ref 26.0–34.0)
MCHC: 32.6 g/dL (ref 30.0–36.0)
MCV: 94.6 fL (ref 80.0–100.0)
Platelets: 296 10*3/uL (ref 150–400)
RBC: 3.67 MIL/uL — ABNORMAL LOW (ref 3.87–5.11)
RDW: 13 % (ref 11.5–15.5)
WBC: 9.3 10*3/uL (ref 4.0–10.5)
nRBC: 0 % (ref 0.0–0.2)

## 2022-10-03 LAB — GLUCOSE, CAPILLARY
Glucose-Capillary: 93 mg/dL (ref 70–99)
Glucose-Capillary: 108 mg/dL — ABNORMAL HIGH (ref 70–99)
Glucose-Capillary: 89 mg/dL (ref 70–99)

## 2022-10-03 LAB — MAGNESIUM: Magnesium: 2 mg/dL (ref 1.7–2.4)

## 2022-10-03 MED ORDER — POLYETHYLENE GLYCOL 3350 17 GM/SCOOP PO POWD: 17.0000 g | Freq: Two times a day (BID) | ORAL | 1 refills | Status: AC | PRN

## 2022-10-03 MED ORDER — POLYETHYLENE GLYCOL 3350 17 G PO PACK
17.0000 g | PACK | Freq: Every day | ORAL | Status: DC
  Administered 2022-10-03: 17 g via ORAL
  Filled 2022-10-03: qty 1

## 2022-10-03 MED ORDER — DOCUSATE SODIUM 50 MG/5ML PO LIQD: 100.0000 mg | Freq: Two times a day (BID) | ORAL | Status: DC | PRN

## 2022-10-03 MED ORDER — SODIUM CHLORIDE 0.9 % IV SOLN: INTRAVENOUS | Status: DC | PRN

## 2022-10-03 MED ORDER — AMOXICILLIN-POT CLAVULANATE 875-125 MG PO TABS: 1.0000 | ORAL_TABLET | Freq: Two times a day (BID) | ORAL | 0 refills | Status: DC

## 2022-10-03 NOTE — Progress Notes (Signed)
Mobility Specialist - Progress Note   10/03/22 1212  Mobility  Activity Ambulated independently in hallway  Level of Assistance Independent  Assistive Device None  Distance Ambulated (ft) 600 ft  Activity Response Tolerated well  Mobility Referral Yes  $Mobility charge 1 Mobility  Mobility Specialist Start Time (ACUTE ONLY) 1158  Mobility Specialist Stop Time (ACUTE ONLY) 1211  Mobility Specialist Time Calculation (min) (ACUTE ONLY) 13 min   Pt received in recliner and agreeable to mobility. No complaints during session. Pt to recliner after session with all needs met.    [facility]

## 2022-10-03 NOTE — Discharge Summary (Signed)
Physician Discharge Summary  Tracey Evans MWU:[id number] DOB: Sep 02, 1945 DOA: 09/29/2022  PCP: Sheliah Hatch, MD  Admit date: 09/29/2022 Discharge date: 10/03/2022 Admitted From: Home Disposition: Home. Recommendations for Outpatient Follow-up:  Follow up with PCP in 1 week Check CMP and CBC at follow-up Please follow up on the following pending results: None  Home Health: Not indicated Equipment/Devices: Not indicated  Discharge Condition: Stable CODE STATUS: Full code  Follow-up Information     Sheliah Hatch, MD. Schedule an appointment as soon as possible for a visit in 1 week(s).   Specialty: Family Medicine Contact information: [address] [place] [address] [phone]                 Hospital course 77 year old F with PMH of diverticulitis, asthma, hyperlipidemia, hypertension and obesity presenting to ED with severe abdominal pain and admitted for distal small bowel diverticulitis with signs of focally contained perforation but no discrete abscess as noted on CT abdomen and pelvis.  General surgery consulted.  Patient was started on IV Zosyn and bowel rest Patient improved with conservative management with IV antibiotics and bowel rest.  She tolerated diet advancement.   On the day of discharge, patient tolerated soft diet and cleared for discharge on p.o. Augmentin for 4 more days for a total of 8 days.  Advised to continue low fiber residue diet for about a week or 2 and then slowly increasing fiber after that.  Patient reports colonoscopy in 12/2021.  See individual problem list below for more.   Problems addressed during this hospitalization Principal Problem:   Acute diverticulitis Active Problems:   Diverticulitis              Time spent 35 minutes  Vital signs Vitals:   10/02/22 2132 10/03/22 0556 10/03/22 1031 10/03/22 1308  BP: 129/85 115/68 110/71 118/89  Pulse: 69 64 65 69  Temp: 98 F (36.7 C) 98.2 F  (36.8 C) (!) 97.5 F (36.4 C) 98 F (36.7 C)  Resp: 18 18 16    Height:      Weight:      SpO2: 98% 95% 97% 98%  TempSrc: Oral Oral Oral Oral  BMI (Calculated):         Discharge exam  GENERAL: No apparent distress.  Nontoxic. HEENT: MMM.  Vision and hearing grossly intact.  NECK: Supple.  No apparent JVD.  RESP:  No IWOB.  Fair aeration bilaterally. CVS:  RRR. Heart sounds normal.  ABD/GI/GU: BS+. Abd soft, NTND.  MSK/EXT:  Moves extremities. No apparent deformity. No edema.  SKIN: no apparent skin lesion or wound NEURO: Awake and alert. Oriented appropriately.  No apparent focal neuro deficit. PSYCH: Calm. Normal affect.   Discharge Instructions Discharge Instructions     Diet - low sodium heart healthy   Complete by: As directed    Discharge instructions   Complete by: As directed    It has been a pleasure taking care of you!  You were hospitalized due to acute diverticulitis for which you have been treated with IV antibiotics.  Your symptoms improved.  We are discharging you more antibiotics to complete treatment course.  It is very important that you complete the whole course of antibiotics.  Continue soft low fiber residue diet for about a week or 2 and slowly increase dietary fiber after that.  Follow-up with your primary care doctor in 1 to 2 weeks or sooner if needed.   Take care,  Increase activity slowly   Complete by: As directed       Allergies as of 10/03/2022       Reactions   Contrast Media [iodinated Contrast Media] Shortness Of Breath   Patient has SOB, tightness in chest and feels like an elephant is sitting on chest, patient should be  scanned at hospital Per Dr. Gery Pray   Dilaudid [hydromorphone Hcl] Anaphylaxis   Pt stopped breathing   Adhesive [tape]    blister   Codeine Nausea Only   insomnia   Cymbalta [duloxetine Hcl] Other (See Comments)   Hallucinations.    Lactose Intolerance (gi) Diarrhea   Latex    REACTION: burns skin- Latex tape  only    Nsaids    Lyrica [pregabalin] Other (See Comments)   Mood swings.         Medication List     TAKE these medications    albuterol 108 (90 Base) MCG/ACT inhaler Commonly known as: VENTOLIN HFA Inhale 2 puffs into the lungs every 6 (six) hours as needed for wheezing or shortness of breath.   amLODipine 5 MG tablet Commonly known as: NORVASC Take 1 tablet (5 mg total) by mouth daily.   amoxicillin-clavulanate 875-125 MG tablet Commonly known as: AUGMENTIN Take 1 tablet by mouth 2 (two) times daily.   aspirin 81 MG tablet Take 81 mg by mouth daily.   atorvastatin 20 MG tablet Commonly known as: LIPITOR TAKE 1 TABLET BY MOUTH EVERY DAY   Benefiber Powd Use as directed, daily   buPROPion 150 MG 24 hr tablet Commonly known as: WELLBUTRIN XL TAKE 1 TABLET BY MOUTH EVERY DAY   gabapentin 300 MG capsule Commonly known as: NEURONTIN TAKE 1 CAPSULE IN THE MORNING , 1 IN AFTERNOON AND 2 CAPSULES BEFORE BED What changed:  how much to take how to take this when to take this   levothyroxine 75 MCG tablet Commonly known as: SYNTHROID TAKE 1 TABLET BY MOUTH EVERY DAY BEFORE BREAKFAST What changed:  how much to take how to take this when to take this additional instructions   Magnesium 400 MG Caps Take by mouth 2 (two) times daily.   meloxicam 15 MG tablet Commonly known as: MOBIC TAKE 1 TABLET (15 MG TOTAL) BY MOUTH DAILY.   omeprazole 20 MG capsule Commonly known as: PRILOSEC TAKE 1 CAPSULE BY MOUTH EVERY DAY What changed: how much to take   polyethylene glycol powder 17 GM/SCOOP powder Commonly known as: MiraLax Take 17 g by mouth 2 (two) times daily as needed for mild constipation.   REFRESH DRY EYE THERAPY OP Place 1 drop into both eyes 3 (three) times daily.   traZODone 50 MG tablet Commonly known as: DESYREL Take 50 mg by mouth at bedtime.        Consultations: General surgery  Procedures/Studies:   CT ABDOMEN PELVIS WO  CONTRAST  Result Date: 09/29/2022 CLINICAL DATA:  Abdominal pain. EXAM: CT ABDOMEN AND PELVIS WITHOUT CONTRAST TECHNIQUE: Multidetector CT imaging of the abdomen and pelvis was performed following the standard protocol without IV contrast. RADIATION DOSE REDUCTION: This exam was performed according to the departmental dose-optimization program which includes automated exposure control, adjustment of the mA and/or kV according to patient size and/or use of iterative reconstruction technique. COMPARISON:  10/05/2018 FINDINGS: Lower chest: No acute abnormality. Hepatobiliary: No focal liver abnormality is seen. Status post cholecystectomy. No biliary dilatation. Pancreas: Unremarkable. No pancreatic ductal dilatation or surrounding inflammatory changes. Spleen: Normal in size without focal abnormality. Adrenals/Urinary  Tract: Normal adrenal glands. Left kidney cysts are identified within the upper pole. The largest measures 2.5 cm. No follow-up imaging recommended. No nephrolithiasis or obstructive uropathy. Urinary bladder appears normal. Stomach/Bowel: Stomach appears normal. The appendix is visualized and appears normal. Distal small bowel diverticulosis identified. Acute small bowel diverticulitis is identified with signs of focally contained perforation. Adjacent to the small bowel is a collection of extraluminal gas measuring 2.3 x 2.0 cm, image 57/2. There is surrounding inflammatory fat stranding noted. No signs of free perforation identified. No discrete abscess identified at this time. Diffuse colonic diverticulosis is also identified without signs of acute diverticulitis. No signs of bowel obstruction. Vascular/Lymphatic: Aortic atherosclerosis. No signs of abdominopelvic adenopathy. Reproductive: Status post hysterectomy. No adnexal masses. Other: No significant free fluid.  No abscess identified. Musculoskeletal: No acute or significant osseous findings. Signs of previous right mastectomy. IMPRESSION: 1.  Examination is positive for acute small bowel diverticulitis with signs of focally contained perforation. No signs of free perforation identified. No discrete abscess identified at this time. 2. Diffuse colonic diverticulosis without signs of acute diverticulitis. 3.  Aortic Atherosclerosis (ICD10-I70.0). Electronically Signed   By: Signa Kell M.D.   On: 09/29/2022 14:52       The results of significant diagnostics from this hospitalization (including imaging, microbiology, ancillary and laboratory) are listed below for reference.     Microbiology: No results found for this or any previous visit (from the past 240 hour(s)).   Labs:  CBC: Recent Labs  Lab 09/29/22 1222 09/30/22 1014 10/01/22 0459 10/02/22 0545 10/03/22 0822  WBC 11.6* 12.2* 11.9* 9.0 9.3  NEUTROABS 9.2*  --   --   --   --   HGB 13.9 11.2* 11.1* 11.4* 11.3*  HCT 43.6 35.3* 35.0* 35.2* 34.7*  MCV 95.8 96.2 96.7 96.4 94.6  PLT 289 225 234 251 296   BMP &GFR Recent Labs  Lab 09/29/22 1222 09/30/22 1014 10/01/22 0459 10/02/22 0545 10/03/22 0822  NA 136 134* 138 140 139  K 4.0 3.8 4.2 4.2 4.1  CL 101 103 104 105 107  CO2 27 25 25 25 26   GLUCOSE 99 120* 104* 94 94  BUN 15 15 10 8  7*  CREATININE 1.01* 1.11* 0.95 0.99 0.97  CALCIUM 8.9 8.0* 8.5* 8.8* 8.7*  MG  --   --  2.0 1.9 2.0   Estimated Creatinine Clearance: 52.2 mL/min (by C-G formula based on SCr of 0.97 mg/dL). Liver & Pancreas: Recent Labs  Lab 09/29/22 1222  AST 23  ALT 25  ALKPHOS 98  BILITOT 1.1  PROT 7.2  ALBUMIN 3.9   Recent Labs  Lab 09/29/22 1222  LIPASE 24   No results for input(s): "AMMONIA" in the last 168 hours. Diabetic: No results for input(s): "HGBA1C" in the last 72 hours. Recent Labs  Lab 10/02/22 0548 10/02/22 1125 10/02/22 2354 10/03/22 0555 10/03/22 1123  GLUCAP 84 93 111* 108* 89   Cardiac Enzymes: No results for input(s): "CKTOTAL", "CKMB", "CKMBINDEX", "TROPONINI" in the last 168 hours. No results  for input(s): "PROBNP" in the last 8760 hours. Coagulation Profile: No results for input(s): "INR", "PROTIME" in the last 168 hours. Thyroid Function Tests: No results for input(s): "TSH", "T4TOTAL", "FREET4", "T3FREE", "THYROIDAB" in the last 72 hours. Lipid Profile: No results for input(s): "CHOL", "HDL", "LDLCALC", "TRIG", "CHOLHDL", "LDLDIRECT" in the last 72 hours. Anemia Panel: No results for input(s): "VITAMINB12", "FOLATE", "FERRITIN", "TIBC", "IRON", "RETICCTPCT" in the last 72 hours. Urine analysis:  Component Value Date/Time   COLORURINE YELLOW 09/29/2022 1730   APPEARANCEUR CLEAR 09/29/2022 1730   LABSPEC 1.021 09/29/2022 1730   PHURINE 6.0 09/29/2022 1730   GLUCOSEU NEGATIVE 09/29/2022 1730   HGBUR SMALL (A) 09/29/2022 1730   BILIRUBINUR NEGATIVE 09/29/2022 1730   BILIRUBINUR 1+ 07/08/2017 1020   KETONESUR NEGATIVE 09/29/2022 1730   PROTEINUR NEGATIVE 09/29/2022 1730   UROBILINOGEN 0.2 07/08/2017 1020   UROBILINOGEN 0.2 07/18/2014 1133   NITRITE NEGATIVE 09/29/2022 1730   LEUKOCYTESUR SMALL (A) 09/29/2022 1730   Sepsis Labs: Invalid input(s): "PROCALCITONIN", "LACTICIDVEN"   SIGNED:  Almon Hercules, MD  [facility] 10/03/2022, 5:45 PM

## 2022-10-03 NOTE — Progress Notes (Signed)
Subjective: CC: Abdominal pain resolved. Tolerating FLD without n/v. Passing flatus. Diarrhea yesterday. No bloody bm or melena. No PRN pain meds since yesterday at 1019am.   Objective: Vital signs in last 24 hours: Temp:  [97.5 F (36.4 C)-98.2 F (36.8 C)] 98.2 F (36.8 C) (06/26 0556) Pulse Rate:  [64-69] 64 (06/26 0556) Resp:  [18] 18 (06/26 0556) BP: (113-129)/(68-95) 115/68 (06/26 0556) SpO2:  [95 %-98 %] 95 % (06/26 0556) Last BM Date : 10/02/22  Intake/Output from previous day: 06/25 0701 - 06/26 0700 In: 911.1 [I.V.:711.1; IV Piggyback:200] Out: -  Intake/Output this shift: No intake/output data recorded.  PE: Gen:  Alert, NAD, pleasant Abd: Soft, ND, NT,+BS  Lab Results:  Recent Labs    10/01/22 0459 10/02/22 0545  WBC 11.9* 9.0  HGB 11.1* 11.4*  HCT 35.0* 35.2*  PLT 234 251    BMET Recent Labs    10/01/22 0459 10/02/22 0545  NA 138 140  K 4.2 4.2  CL 104 105  CO2 25 25  GLUCOSE 104* 94  BUN 10 8  CREATININE 0.95 0.99  CALCIUM 8.5* 8.8*    PT/INR No results for input(s): "LABPROT", "INR" in the last 72 hours. CMP     Component Value Date/Time   NA 140 10/02/2022 0545   NA 147 (H) 06/01/2021 1007   NA 143 11/30/2016 0904   K 4.2 10/02/2022 0545   K 4.2 11/30/2016 0904   CL 105 10/02/2022 0545   CO2 25 10/02/2022 0545   CO2 25 11/30/2016 0904   GLUCOSE 94 10/02/2022 0545   GLUCOSE 127 11/30/2016 0904   BUN 8 10/02/2022 0545   BUN 15 06/01/2021 1007   BUN 17.3 11/30/2016 0904   CREATININE 0.99 10/02/2022 0545   CREATININE 1.1 11/30/2016 0904   CALCIUM 8.8 (L) 10/02/2022 0545   CALCIUM 9.7 11/30/2016 0904   PROT 7.2 09/29/2022 1222   PROT 6.9 06/01/2021 1007   PROT 6.7 11/30/2016 0904   ALBUMIN 3.9 09/29/2022 1222   ALBUMIN 4.4 06/01/2021 1007   ALBUMIN 3.6 11/30/2016 0904   AST 23 09/29/2022 1222   AST 16 11/30/2016 0904   ALT 25 09/29/2022 1222   ALT 21 11/30/2016 0904   ALKPHOS 98 09/29/2022 1222   ALKPHOS 102  11/30/2016 0904   BILITOT 1.1 09/29/2022 1222   BILITOT 0.3 06/01/2021 1007   BILITOT 0.49 11/30/2016 0904   GFRNONAA 59 (L) 10/02/2022 0545   GFRAA 59 (L) 10/06/2018 0500   Lipase     Component Value Date/Time   LIPASE 24 09/29/2022 1222    Studies/Results: No results found.  Anti-infectives: Anti-infectives (From admission, onward)    Start     Dose/Rate Route Frequency Ordered Stop   09/29/22 2200  piperacillin-tazobactam (ZOSYN) IVPB 3.375 g  Status:  Discontinued        3.375 g 100 mL/hr over 30 Minutes Intravenous Every 8 hours 09/29/22 1718 09/29/22 1722   09/29/22 1815  piperacillin-tazobactam (ZOSYN) IVPB 3.375 g        3.375 g 12.5 mL/hr over 240 Minutes Intravenous Every 8 hours 09/29/22 1722     09/29/22 1500  piperacillin-tazobactam (ZOSYN) IVPB 3.375 g        3.375 g 100 mL/hr over 30 Minutes Intravenous  Once 09/29/22 1457 09/29/22 1551        Assessment/Plan Small bowel diverticulitis with contained perforation - CT 6/22 w/ small bowel diverticulitis with contained perforation (collection of extraluminal gas measuring 2.3  x 2.0 cm, with surrounding inflammatory fat stranding noted). No signs of free perforation identified. No discrete abscess identified at this time. No significant free fluid  - HDS. Afebrile without tachcyardia or hypotension. WBC normalized.  Exam without peritonitis. Symptoms have now resolved. No indication for emergency surgery  - Adv diet. If tolerates soft diet without any recurrence of abdominal pain, nausea or vomiting she is okay for discharge from our standpoint (later today vs tomorrow). Will discuss with MD duration of abx recommended.  - We will follow with you  FEN - Soft diet, IVF per [facility] VTE - SCDs, okay for chem ppx from our standpoint ID - Zosyn  Hx HTN Hx CAD - NM scan 2020 w/ No reversible ischemia or infarction, normal left ventricular wall motion, LV EF 66% and low non invasive risk stratification. Reports she can  walk 2-3 blocks without cp or sob.  Hx Hypothyroidism Hx R breast CA s/p mastectomy, chemo, and anti-estrogen tx followed by Dr. Pamelia Hoit. Reports she is now off anti-estrogen tx   I reviewed nursing notes, hospitalist notes, last 24 h vitals and pain scores, last 48 h intake and output, last 24 h labs and trends, and last 24 h imaging results.   LOS: 3 days    Jacinto Halim , [facility] Surgery 10/03/2022, 8:24 AM Please see Amion for pager number during day hours 7:00am-4:30pm

## 2022-10-04 ENCOUNTER — Telehealth: Payer: Self-pay

## 2022-10-04 NOTE — Transitions of Care (Post Inpatient/ED Visit) (Signed)
   10/04/2022  Name: Tracey CORRIHER MRN: [id number] DOB: [birth year]  Today's TOC FU Call Status: Today's TOC FU Call Status:: Unsuccessul Call (1st Attempt) Unsuccessful Call (1st Attempt) Date: 10/04/22  Attempted to reach the patient regarding the most recent Inpatient/ED visit.  Follow Up Plan: Additional outreach attempts will be made to reach the patient to complete the Transitions of Care (Post Inpatient/ED visit) call.      Antionette Fairy, RN,BSN,CCM [facility] Care Management Care Management Community Coordinator Direct Phone: [phone] Toll Free: [phone] Fax: [phone]

## 2022-10-04 NOTE — Transitions of Care (Post Inpatient/ED Visit) (Signed)
10/04/2022  Name: Tracey Evans MRN: [id number] DOB: [birth year]  Today's TOC FU Call Status: Today's TOC FU Call Status:: Successful TOC FU Call Competed TOC FU Call Complete Date: 10/04/22 (Incoming call from pt returning RN CM call.)  Transition Care Management Follow-up Telephone Call Date of Discharge: 10/03/22 Discharge Facility: [facility]) Type of Discharge: Inpatient Admission Primary Inpatient Discharge Diagnosis:: 'acute diverticulitis" How have you been since you were released from the hospital?: Better (pt pleased to report how well she is doing. No GI issues since coming home. Appetite good. LBM was 2days ago.) Any questions or concerns?: No  Items Reviewed: Did you receive and understand the discharge instructions provided?: Yes Medications obtained,verified, and reconciled?: Yes (Medications Reviewed) (Pt states she is not taking Miralax-prefers to take Benefiber that she was taking prior to admission) Any new allergies since your discharge?: No Dietary orders reviewed?: Yes Type of Diet Ordered:: soft low fiber residue diet-adv as tolerated-low salt/heart healthy Do you have support at home?: Yes People in Home: alone, child(ren), adult Name of Support/Comfort Primary Source: daughter-Kim  Medications Reviewed Today: Medications Reviewed Today     Reviewed by Charlyn Minerva, RN (Registered Nurse) on 10/04/22 at 1202  Med List Status: <None>   Medication Order Taking? Sig Documenting Provider Last Dose Status Informant  albuterol (VENTOLIN HFA) 108 (90 Base) MCG/ACT inhaler [id number] Yes Inhale 2 puffs into the lungs every 6 (six) hours as needed for wheezing or shortness of breath. Shade Flood, MD Taking Active Self, Pharmacy Records  amLODipine [facility]) 5 MG tablet [id number] Yes Take 1 tablet (5 mg total) by mouth daily. Sheliah Hatch, MD Taking Active Self, Pharmacy Records  amoxicillin-clavulanate (AUGMENTIN) 875-125 MG tablet  [id number] Yes Take 1 tablet by mouth 2 (two) times daily. Diamantina Monks, MD Taking Active   aspirin 81 MG tablet [id number] Yes Take 81 mg by mouth daily. [provider] Taking Active Self, Pharmacy Records  atorvastatin (LIPITOR) 20 MG tablet [id number] Yes TAKE 1 TABLET BY MOUTH EVERY DAY Sheliah Hatch, MD Taking Active Self, Pharmacy Records  buPROPion (WELLBUTRIN XL) 150 MG 24 hr tablet [id number] Yes TAKE 1 TABLET BY MOUTH EVERY DAY Sheliah Hatch, MD Taking Active Self, Pharmacy Records  gabapentin (NEURONTIN) 300 MG capsule [id number] Yes TAKE 1 CAPSULE IN THE MORNING , 1 IN AFTERNOON AND 2 CAPSULES BEFORE BED  Patient taking differently: Take 300-600 mg by mouth 3 (three) times daily. TAKE 1 CAPSULE IN THE MORNING , 1 IN AFTERNOON AND 2 CAPSULES BEFORE BED   Sheliah Hatch, MD Taking Active Self, Pharmacy Records  Glycerin-Polysorbate 80 [facility] DRY EYE THERAPY OP) [id number] Yes Place 1 drop into both eyes 3 (three) times daily. [provider] Taking Active Self, Pharmacy Records  levothyroxine (SYNTHROID) 75 MCG tablet [id number] Yes TAKE 1 TABLET BY MOUTH EVERY DAY BEFORE BREAKFAST  Patient taking differently: Take 75 mcg by mouth daily before breakfast.   Sheliah Hatch, MD Taking Active Self, Pharmacy Records  Magnesium 400 MG CAPS [id number] Yes Take by mouth 2 (two) times daily. [provider] Taking Active Self, Pharmacy Records  meloxicam (MOBIC) 15 MG tablet [id number] Yes TAKE 1 TABLET (15 MG TOTAL) BY MOUTH DAILY. Sheliah Hatch, MD Taking Active Self, Pharmacy Records  omeprazole (PRILOSEC) 20 MG capsule [id number] Yes TAKE 1 CAPSULE BY MOUTH EVERY DAY  Patient taking differently: Take 20 mg by mouth daily.   Sheliah Hatch, MD Taking Active Self, Pharmacy  Records  polyethylene glycol powder (MIRALAX) 17 GM/SCOOP powder [id number]  Take 17 g by mouth 2 (two) times daily as needed for mild constipation. Almon Hercules, MD   Active   traZODone (DESYREL) 50 MG tablet [id number] Yes Take 50 mg by mouth at bedtime. [provider] Taking Active Self, Pharmacy Records  Wheat Dextrin [facility]) POWD [id number] Yes Use as directed, daily  Patient taking differently: Use as directed, pt taking TID   Doree Albee, PA-C Taking Active Self, Pharmacy Records            Home Care and Equipment/Supplies: Were Home Health Services Ordered?: NA Any new equipment or medical supplies ordered?: NA  Functional Questionnaire: Do you need assistance with bathing/showering or dressing?: No Do you need assistance with meal preparation?: No Do you need assistance with eating?: No Do you have difficulty maintaining continence: No Do you need assistance with getting out of bed/getting out of a chair/moving?: No Do you have difficulty managing or taking your medications?: No  Follow up appointments reviewed: PCP Follow-up appointment confirmed?: Yes Date of PCP follow-up appointment?: 10/09/22 Follow-up Provider: Dr. Beverely Low Specialist [facility] Follow-up appointment confirmed?: NA Do you need transportation to your follow-up appointment?: No Do you understand care options if your condition(s) worsen?: Yes-patient verbalized understanding  SDOH Interventions Today    Flowsheet Row Most Recent Value  SDOH Interventions   Food Insecurity Interventions Intervention Not Indicated      TOC Interventions Today    Flowsheet Row Most Recent Value  TOC Interventions   TOC Interventions Discussed/Reviewed TOC Interventions Discussed, Arranged PCP follow up within 7 days/Care Guide scheduled  [pt already had appt with PCP on 10/09/22-visit type changed to hosp f/u]      Interventions Today    Flowsheet Row Most Recent Value  General Interventions   General Interventions Discussed/Reviewed General Interventions Discussed, Doctor Visits  Doctor Visits Discussed/Reviewed Doctor Visits Discussed, PCP  PCP/Specialist  Visits Compliance with follow-up visit  Education Interventions   Education Provided Provided Education  Provided Verbal Education On Nutrition, Medication, When to see the doctor, Other  [bowel regimen]  Nutrition Interventions   Nutrition Discussed/Reviewed Nutrition Discussed, Adding fruits and vegetables, Increasing proteins, Decreasing salt, Decreasing fats, Fluid intake  Pharmacy Interventions   Pharmacy Dicussed/Reviewed Medications and their functions, Pharmacy Topics Discussed  Safety Interventions   Safety Discussed/Reviewed Safety Discussed       Alessandra Grout [facility] Care Management Care Management Community Coordinator Direct Phone: [phone] Toll Free: [phone] Fax: [phone]

## 2022-10-05 ENCOUNTER — Other Ambulatory Visit: Payer: Self-pay | Admitting: Family Medicine

## 2022-10-05 DIAGNOSIS — E039 Hypothyroidism, unspecified: Secondary | ICD-10-CM

## 2022-10-09 ENCOUNTER — Inpatient Hospital Stay: Payer: Medicare Other | Admitting: Family Medicine

## 2022-10-10 ENCOUNTER — Ambulatory Visit (INDEPENDENT_AMBULATORY_CARE_PROVIDER_SITE_OTHER): Payer: Medicare Other | Admitting: *Deleted

## 2022-10-10 DIAGNOSIS — Z0000 Encounter for general adult medical examination without abnormal findings: Secondary | ICD-10-CM

## 2022-10-10 NOTE — Progress Notes (Signed)
Subjective:   Tracey Evans is a 77 y.o. female who presents for Medicare Annual (Subsequent) preventive examination.  Visit Complete: Virtual  I connected with  Tracey Evans on 10/10/22 by a audio enabled telemedicine application and verified that I am speaking with the correct person using two identifiers.  Patient Location: Home  Provider Location: Home Office  I discussed the limitations of evaluation and management by telemedicine. The patient expressed understanding and agreed to proceed.   Review of Systems     Cardiac Risk Factors include: advanced age (>40men, >74 women);family history of premature cardiovascular disease;hypertension     Objective:    Today's Vitals   There is no height or weight on file to calculate BMI.     10/10/2022    8:47 AM 09/29/2022    7:01 PM 09/29/2022   11:55 AM 12/30/2021    8:30 AM 09/14/2021    9:39 AM 10/05/2018    6:06 PM 10/05/2018   11:36 AM  Advanced Directives  Does Patient Have a Medical Advance Directive? Yes  No Yes Yes Yes Yes  Type of Clinical research associate of [place];Living will Healthcare Power of [place];Living will Living will Healthcare Power of [place];Living will  Does patient want to make changes to medical advance directive?      Yes (Inpatient - patient defers changing a medical advance directive at this time - Information given)   Copy of Healthcare Power of Attorney in Chart? No - copy requested    No - copy requested    Would patient like information on creating a medical advance directive?  No - Patient declined         Current Medications (verified) Outpatient Encounter Medications as of 10/10/2022  Medication Sig   albuterol (VENTOLIN HFA) 108 (90 Base) MCG/ACT inhaler Inhale 2 puffs into the lungs every 6 (six) hours as needed for wheezing or shortness of breath.   amLODipine (NORVASC) 5 MG tablet Take 1 tablet (5 mg total) by mouth daily.   aspirin 81  MG tablet Take 81 mg by mouth daily.   atorvastatin (LIPITOR) 20 MG tablet TAKE 1 TABLET BY MOUTH EVERY DAY   buPROPion (WELLBUTRIN XL) 150 MG 24 hr tablet TAKE 1 TABLET BY MOUTH EVERY DAY   gabapentin (NEURONTIN) 300 MG capsule TAKE 1 CAPSULE IN THE MORNING , 1 IN AFTERNOON AND 2 CAPSULES BEFORE BED (Patient taking differently: Take 300-600 mg by mouth 3 (three) times daily. TAKE 1 CAPSULE IN THE MORNING , 1 IN AFTERNOON AND 2 CAPSULES BEFORE BED)   Glycerin-Polysorbate 80 (REFRESH DRY EYE THERAPY OP) Place 1 drop into both eyes 3 (three) times daily.   levothyroxine (SYNTHROID) 75 MCG tablet TAKE 1 TABLET BY MOUTH EVERY DAY BEFORE BREAKFAST   Magnesium 400 MG CAPS Take by mouth 2 (two) times daily.   meloxicam (MOBIC) 15 MG tablet TAKE 1 TABLET (15 MG TOTAL) BY MOUTH DAILY.   omeprazole (PRILOSEC) 20 MG capsule TAKE 1 CAPSULE BY MOUTH EVERY DAY (Patient taking differently: Take 20 mg by mouth daily.)   polyethylene glycol powder (MIRALAX) 17 GM/SCOOP powder Take 17 g by mouth 2 (two) times daily as needed for mild constipation.   traZODone (DESYREL) 50 MG tablet Take 50 mg by mouth at bedtime.   Wheat Dextrin (BENEFIBER) POWD Use as directed, daily (Patient taking differently: Use as directed, pt taking TID)   amoxicillin-clavulanate (AUGMENTIN) 875-125 MG tablet Take 1 tablet by mouth  2 (two) times daily. (Patient not taking: Reported on 10/10/2022)   No facility-administered encounter medications on file as of 10/10/2022.    Allergies (verified) Contrast media [iodinated contrast media], Dilaudid [hydromorphone hcl], Adhesive [tape], Codeine, Cymbalta [duloxetine hcl], Lactose intolerance (gi), Latex, Nsaids, and Lyrica [pregabalin]   History: Past Medical History:  Diagnosis Date   Angina at rest    "occurs whenever it wants to, but worse during agitation"   Anxiety    Asthma    Binge eating disorder    Breast cancer of lower-outer quadrant of left female breast (HCC) 07/08/2015    "NEVER HAD LEFT BREAST CANCER" (08/15/2015)   Cancer of right breast (HCC) 08/15/2015   Chronic bronchitis (HCC)    Chronic lower back pain    Complication of anesthesia    had bronc spasms during intubation surgery 2009 on foot-need albuterol inhaler or neb tx preop   Costochondritis    Depression    Diverticular disease    11-2020 bleed   Fibromyalgia    GERD (gastroesophageal reflux disease)    Hot flashes    Hyperlipidemia    Hypertension    Hypothyroidism    Osteoarthritis    "all over"   Peptic ulcer disease    Personal history of chemotherapy 2017   Shortness of breath dyspnea    Type II diabetes mellitus (HCC)    "diet controlled" (08/15/2015)   Past Surgical History:  Procedure Laterality Date   ABDOMINAL HYSTERECTOMY     "left my ovaries"   ANKLE ARTHROSCOPY  01/02/2012   Procedure: ANKLE ARTHROSCOPY;  Surgeon: Sherri Rad, MD;  Location: [facility];  Service: Orthopedics;  Laterality: Right;  right ankle arthroscopy with extensive debridement , dridement and drilling talar dome osteochondral lesion   ANKLE FRACTURE SURGERY Right 2009   Dr. Shon Baton   BREAST BIOPSY  07/2015   CARDIAC CATHETERIZATION     CARPAL TUNNEL RELEASE Bilateral    CATARACT EXTRACTION, BILATERAL     COLONOSCOPY     DILATION AND CURETTAGE OF UTERUS     "before hysterectomy"   FRACTURE SURGERY     KNEE ARTHROSCOPY Bilateral    LAPAROSCOPIC CHOLECYSTECTOMY     MASTECTOMY COMPLETE / SIMPLE W/ SENTINEL NODE BIOPSY Right 08/15/2015   MASTECTOMY W/ SENTINEL NODE BIOPSY Right 08/15/2015   Procedure: TOTAL MASTECTOMY WITH SENTINEL LYMPH NODE BIOPSY AND BLUE DYE INJECTION ;  Surgeon: Claud Kelp, MD;  Location: [facility];  Service: General;  Laterality: Right;   PORT-A-CATH REMOVAL N/A 11/12/2016   Procedure: REMOVAL PORT-A-CATH;  Surgeon: Avel Peace, MD;  Location: [facility] [place];  Service: General;  Laterality: N/A;   PORTACATH PLACEMENT Left 08/15/2015   PORTACATH PLACEMENT  Left 08/15/2015   Procedure: INSERTION PORT-A-CATH ;  Surgeon: Claud Kelp, MD;  Location: [facility];  Service: General;  Laterality: Left;   SHOULDER ARTHROSCOPY Bilateral 2009-2011   "bone spurs"   TONSILLECTOMY     Family History  Problem Relation Age of Onset   Colon polyps Mother    Dementia Mother    Stroke Mother    Heart Problems Mother    Dementia Father    Alcohol abuse Father    Colon cancer Father        dx. 74 or younger-pt states never confirmed   Breast cancer Sister 29       inflammatory   Diverticulitis Sister        maternal half-sister; severe - causing partial colectomy  Breast cancer Sister        maternal half-sister; dx. early 79s   Alcohol abuse Brother    Colon polyps Brother    Breast cancer Other 45       niece   Breast cancer Other        niece dx. 50s   Esophageal cancer Neg Hx    Stomach cancer Neg Hx    Rectal cancer Neg Hx    Social History   Socioeconomic History   Marital status: Single    Spouse name: Not on file   Number of children: 3   Years of education: Not on file   Highest education level: Not on file  Occupational History   Occupation: retired  Tobacco Use   Smoking status: Never   Smokeless tobacco: Never  Vaping Use   Vaping Use: Never used  Substance and Sexual Activity   Alcohol use: Not Currently    Alcohol/week: 0.0 standard drinks of alcohol    Comment: 08/15/2015 "1/2 glass of wine q 2 months"    Drug use: No   Sexual activity: Not Currently    Comment: 1st intercourse- 14, partners- 4, divorced  Other Topics Concern   Not on file  Social History Narrative   Not on file   Social Determinants of Health   Financial Resource Strain: Low Risk  (10/10/2022)   Overall Financial Resource Strain (CARDIA)    Difficulty of Paying Living Expenses: Not hard at all  Food Insecurity: No Food Insecurity (10/10/2022)   Hunger Vital Sign    Worried About Running Out of Food in the Last Year: Never true    Ran Out of Food in  the Last Year: Never true  Transportation Needs: No Transportation Needs (10/10/2022)   PRAPARE - Administrator, Civil Service (Medical): No    Lack of Transportation (Non-Medical): No  Physical Activity: Insufficiently Active (10/10/2022)   Exercise Vital Sign    Days of Exercise per Week: 3 days    Minutes of Exercise per Session: 20 min  Stress: No Stress Concern Present (10/10/2022)   [employer] of Occupational Health - Occupational Stress Questionnaire    Feeling of Stress : Not at all  Social Connections: Unknown (10/10/2022)   Social Connection and Isolation Panel [NHANES]    Frequency of Communication with Friends and Family: More than three times a week    Frequency of Social Gatherings with Friends and Family: Three times a week    Attends Religious Services: More than 4 times per year    Active Member of Clubs or Organizations: Yes    Attends Engineer, structural: More than 4 times per year    Marital Status: Not on file    Tobacco Counseling Counseling given: Not Answered   Clinical Intake:     Pain : No/denies pain     Diabetes: No  How often do you need to have someone help you when you read instructions, pamphlets, or other written materials from your doctor or pharmacy?: 1 - Never     Information entered by :: Remi Haggard LPN   Activities of Daily Living    10/10/2022    8:53 AM 09/29/2022    5:10 PM  In your present state of health, do you have any difficulty performing the following activities:  Hearing? 0   Vision? 0   Difficulty concentrating or making decisions? 0   Walking or climbing stairs? 1   Dressing or bathing?  0   Doing errands, shopping? 0 0  Preparing Food and eating ? N   Using the Toilet? N   In the past six months, have you accidently leaked urine? Y   Do you have problems with loss of bowel control? N   Managing your Medications? N   Managing your Finances? N     Patient Care Team: Sheliah Hatch,  MD as PCP - General (Family Medicine) Rinaldo Cloud, MD as Consulting Physician (Cardiology) Chalmers Guest, MD as Consulting Physician (Ophthalmology) Serena Croissant, MD as Consulting Physician (Hematology and Oncology) Dorothy Puffer, MD as Consulting Physician (Radiation Oncology) Claud Kelp, MD as Consulting Physician (General Surgery) Orth[facility], [facility] (Specialist) Sheran Luz, MD as Consulting Physician (Physical Medicine and Rehabilitation) Axel Filler, Larna Daughters, NP as Nurse Practitioner (Hematology and Oncology)  Indicate any recent Medical Services you may have received from other than [facility] providers in the past year (date may be approximate).     Assessment:   This is a routine wellness examination for Rayel.  Hearing/Vision screen Hearing Screening - Comments:: No trouble hearing Vision Screening - Comments:: Up to date [facility]  Dietary issues and exercise activities discussed:     Goals Addressed   None    Depression Screen    10/10/2022    8:52 AM 09/19/2022    8:25 AM 07/10/2022    9:03 AM 06/05/2022    9:24 AM 04/27/2022    7:47 AM 03/27/2022   11:07 AM 01/26/2022   10:46 AM  PHQ 2/9 Scores  PHQ - 2 Score 0 0 0 0 3 0 0  PHQ- 9 Score 1 0 0 3 8 6  0    Fall Risk    10/10/2022    8:45 AM 09/19/2022    8:25 AM 07/10/2022    9:03 AM 06/05/2022    9:24 AM 04/27/2022    7:48 AM  Fall Risk   Falls in the past year? 0 0 0 0 0  Number falls in past yr: 0 0 0 0 0  Injury with Fall? 0 0 0 0 0  Risk for fall due to :  No Fall Risks No Fall Risks No Fall Risks No Fall Risks  Follow up Falls evaluation completed;Education provided;Falls prevention discussed Falls evaluation completed Falls evaluation completed Falls evaluation completed Falls evaluation completed    MEDICARE RISK AT HOME:  Medicare Risk at Home - 10/10/22 0845     Any stairs in or around the home? No    If so, are there any without handrails? No    Home free of loose throw rugs in walkways, pet  beds, electrical cords, etc? Yes    Adequate lighting in your home to reduce risk of falls? Yes    Life alert? No    Use of a cane, walker or w/c? No    Grab bars in the bathroom? No    Shower chair or bench in shower? No    Elevated toilet seat or a handicapped toilet? No             TIMED UP AND GO:  Was the test performed?  No    Cognitive Function:        10/10/2022    8:49 AM  6CIT Screen  What Year? 0 points  What month? 0 points  What time? 0 points  Count back from 20 4 points  Months in reverse 2 points  Repeat phrase 0 points  Total Score 6 points  Immunizations Immunization History  Administered Date(s) Administered   Fluad Quad(high Dose 65+) 12/19/2018   Influenza Split 01/23/2012   Influenza, High Dose Seasonal PF 01/06/2014   Influenza,inj,Quad PF,6+ Mos 01/16/2013, 12/09/2014, 02/09/2016, 03/22/2017   Influenza,inj,quad, With Preservative 01/07/2018   Influenza,trivalent, recombinat, inj, PF 12/20/2021   Influenza-Unspecified 12/21/2019   Moderna Sars-Covid-2 Vaccination 01/08/2022   PFIZER(Purple Top)SARS-COV-2 Vaccination 07/02/2019, 07/23/2019   Pfizer Covid-19 Vaccine Bivalent Booster 57yrs & up 01/20/2021   Pneumococcal Conjugate-13 04/21/2015   Pneumococcal Polysaccharide-23 10/22/2011   Tdap 01/16/2013, 10/26/2019   Zoster Recombinant(Shingrix) 01/20/2021, 04/05/2021    TDAP status: Up to date  Flu Vaccine status: Up to date  Pneumococcal vaccine status: Up to date  Covid-19 vaccine status: Information provided on how to obtain vaccines.   Qualifies for Shingles Vaccine? No   Zostavax completed Yes   Shingrix Completed?: Yes  Screening Tests Health Maintenance  Topic Date Due   Diabetic kidney evaluation - Urine ACR  09/27/2022   INFLUENZA VACCINE  11/08/2022   OPHTHALMOLOGY EXAM  11/30/2022   HEMOGLOBIN A1C  01/09/2023   FOOT EXAM  03/28/2023   Diabetic kidney evaluation - eGFR measurement  10/03/2023   Medicare Annual  Wellness (AWV)  10/10/2023   DTaP/Tdap/Td (3 - Td or Tdap) 10/25/2029   Pneumonia Vaccine 86+ Years old  Completed   DEXA SCAN  Completed   Hepatitis C Screening  Completed   Zoster Vaccines- Shingrix  Completed   HPV VACCINES  Aged Out   Colonoscopy  Discontinued   COVID-19 Vaccine  Discontinued    Health Maintenance  Health Maintenance Due  Topic Date Due   Diabetic kidney evaluation - Urine ACR  09/27/2022    Colorectal cancer screening: No longer required.   Mammogram status: Completed  . Repeat every year  Bone Density status: Completed 2023. Results reflect: Bone density results: NORMAL. Repeat every 2 years.  Lung Cancer Screening: (Low Dose CT Chest recommended if Age 60-80 years, 20 pack-year currently smoking OR have quit w/in 15years.) does not qualify.   Lung Cancer Screening Referral:   Additional Screening:  Hepatitis C Screening: does not qualify; Completed 2019  Vision Screening: Recommended annual ophthalmology exams for early detection of glaucoma and other disorders of the eye. Is the patient up to date with their annual eye exam?  Yes  Who is the provider or what is the name of the office in which the patient attends annual eye exams? [facility] If pt is not established with a provider, would they like to be referred to a provider to establish care? No .   Dental Screening: Recommended annual dental exams for proper oral hygiene    Community Resource Referral / Chronic Care Management: CRR required this visit?  No   CCM required this visit?  No     Plan:     I have personally reviewed and noted the following in the patient's chart:   Medical and social history Use of alcohol, tobacco or illicit drugs  Current medications and supplements including opioid prescriptions. Patient is not currently taking opioid prescriptions. Functional ability and status Nutritional status Physical activity Advanced directives List of other  physicians Hospitalizations, surgeries, and ER visits in previous 12 months Vitals Screenings to include cognitive, depression, and falls Referrals and appointments  In addition, I have reviewed and discussed with patient certain preventive protocols, quality metrics, and best practice recommendations. A written personalized care plan for preventive services as well as general preventive health recommendations were provided to  patient.     Remi Haggard, LPN   04/14/1094   After Visit Summary: (MyChart) Due to this being a telephonic visit, the after visit summary with patients personalized plan was offered to patient via MyChart   Nurse Notes:

## 2022-10-10 NOTE — Patient Instructions (Signed)
Tracey Evans , Thank you for taking time to come for your Medicare Wellness Visit. I appreciate your ongoing commitment to your health goals. Please review the following plan we discussed and let me know if I can assist you in the future.   Screening recommendations/referrals: Colonoscopy: no longer required Mammogram: up to date Bone Density: up to date Recommended yearly ophthalmology/optometry visit for glaucoma screening and checkup Recommended yearly dental visit for hygiene and checkup  Vaccinations: Influenza vaccine: up to date Pneumococcal vaccine: up to date Tdap vaccine: up to date Shingles vaccine: up to date     Preventive Care 65 Years and Older, Female Preventive care refers to lifestyle choices and visits with your health care provider that can promote health and wellness. What does preventive care include? A yearly physical exam. This is also called an annual well check. Dental exams once or twice a year. Routine eye exams. Ask your health care provider how often you should have your eyes checked. Personal lifestyle choices, including: Daily care of your teeth and gums. Regular physical activity. Eating a healthy diet. Avoiding tobacco and drug use. Limiting alcohol use. Practicing safe sex. Taking low-dose aspirin every day. Taking vitamin and mineral supplements as recommended by your health care provider. What happens during an annual well check? The services and screenings done by your health care provider during your annual well check will depend on your age, overall health, lifestyle risk factors, and family history of disease. Counseling  Your health care provider may ask you questions about your: Alcohol use. Tobacco use. Drug use. Emotional well-being. Home and relationship well-being. Sexual activity. Eating habits. History of falls. Memory and ability to understand (cognition). Work and work Astronomer. Reproductive health. Screening  You  may have the following tests or measurements: Height, weight, and BMI. Blood pressure. Lipid and cholesterol levels. These may be checked every 5 years, or more frequently if you are over 46 years old. Skin check. Lung cancer screening. You may have this screening every year starting at age 52 if you have a 30-pack-year history of smoking and currently smoke or have quit within the past 15 years. Fecal occult blood test (FOBT) of the stool. You may have this test every year starting at age 61. Flexible sigmoidoscopy or colonoscopy. You may have a sigmoidoscopy every 5 years or a colonoscopy every 10 years starting at age 58. Hepatitis C blood test. Hepatitis B blood test. Sexually transmitted disease (STD) testing. Diabetes screening. This is done by checking your blood sugar (glucose) after you have not eaten for a while (fasting). You may have this done every 1-3 years. Bone density scan. This is done to screen for osteoporosis. You may have this done starting at age 52. Mammogram. This may be done every 1-2 years. Talk to your health care provider about how often you should have regular mammograms. Talk with your health care provider about your test results, treatment options, and if necessary, the need for more tests. Vaccines  Your health care provider may recommend certain vaccines, such as: Influenza vaccine. This is recommended every year. Tetanus, diphtheria, and acellular pertussis (Tdap, Td) vaccine. You may need a Td booster every 10 years. Zoster vaccine. You may need this after age 37. Pneumococcal 13-valent conjugate (PCV13) vaccine. One dose is recommended after age 40. Pneumococcal polysaccharide (PPSV23) vaccine. One dose is recommended after age 53. Talk to your health care provider about which screenings and vaccines you need and how often you need them. This information  is not intended to replace advice given to you by your health care provider. Make sure you discuss any  questions you have with your health care provider. Document Released: 04/22/2015 Document Revised: 12/14/2015 Document Reviewed: 01/25/2015 Elsevier Interactive Patient Education  2017 [employer].  Fall Prevention in the Home Falls can cause injuries. They can happen to people of all ages. There are many things you can do to make your home safe and to help prevent falls. What can I do on the outside of my home? Regularly fix the edges of walkways and driveways and fix any cracks. Remove anything that might make you trip as you walk through a door, such as a raised step or threshold. Trim any bushes or trees on the path to your home. Use bright outdoor lighting. Clear any walking paths of anything that might make someone trip, such as rocks or tools. Regularly check to see if handrails are loose or broken. Make sure that both sides of any steps have handrails. Any raised decks and porches should have guardrails on the edges. Have any leaves, snow, or ice cleared regularly. Use sand or salt on walking paths during winter. Clean up any spills in your garage right away. This includes oil or grease spills. What can I do in the bathroom? Use night lights. Install grab bars by the toilet and in the tub and shower. Do not use towel bars as grab bars. Use non-skid mats or decals in the tub or shower. If you need to sit down in the shower, use a plastic, non-slip stool. Keep the floor dry. Clean up any water that spills on the floor as soon as it happens. Remove soap buildup in the tub or shower regularly. Attach bath mats securely with double-sided non-slip rug tape. Do not have throw rugs and other things on the floor that can make you trip. What can I do in the bedroom? Use night lights. Make sure that you have a light by your bed that is easy to reach. Do not use any sheets or blankets that are too big for your bed. They should not hang down onto the floor. Have a firm chair that has side  arms. You can use this for support while you get dressed. Do not have throw rugs and other things on the floor that can make you trip. What can I do in the kitchen? Clean up any spills right away. Avoid walking on wet floors. Keep items that you use a lot in easy-to-reach places. If you need to reach something above you, use a strong step stool that has a grab bar. Keep electrical cords out of the way. Do not use floor polish or wax that makes floors slippery. If you must use wax, use non-skid floor wax. Do not have throw rugs and other things on the floor that can make you trip. What can I do with my stairs? Do not leave any items on the stairs. Make sure that there are handrails on both sides of the stairs and use them. Fix handrails that are broken or loose. Make sure that handrails are as long as the stairways. Check any carpeting to make sure that it is firmly attached to the stairs. Fix any carpet that is loose or worn. Avoid having throw rugs at the top or bottom of the stairs. If you do have throw rugs, attach them to the floor with carpet tape. Make sure that you have a light switch at the top of  the stairs and the bottom of the stairs. If you do not have them, ask someone to add them for you. What else can I do to help prevent falls? Wear shoes that: Do not have high heels. Have rubber bottoms. Are comfortable and fit you well. Are closed at the toe. Do not wear sandals. If you use a stepladder: Make sure that it is fully opened. Do not climb a closed stepladder. Make sure that both sides of the stepladder are locked into place. Ask someone to hold it for you, if possible. Clearly mark and make sure that you can see: Any grab bars or handrails. First and last steps. Where the edge of each step is. Use tools that help you move around (mobility aids) if they are needed. These include: Canes. Walkers. Scooters. Crutches. Turn on the lights when you go into a dark area.  Replace any light bulbs as soon as they burn out. Set up your furniture so you have a clear path. Avoid moving your furniture around. If any of your floors are uneven, fix them. If there are any pets around you, be aware of where they are. Review your medicines with your doctor. Some medicines can make you feel dizzy. This can increase your chance of falling. Ask your doctor what other things that you can do to help prevent falls. This information is not intended to replace advice given to you by your health care provider. Make sure you discuss any questions you have with your health care provider. Document Released: 01/20/2009 Document Revised: 09/01/2015 Document Reviewed: 04/30/2014 Elsevier Interactive Patient Education  2017 [employer].

## 2022-10-16 DIAGNOSIS — H40042 Steroid responder, left eye: Secondary | ICD-10-CM | POA: Diagnosis not present

## 2022-10-16 DIAGNOSIS — H20022 Recurrent acute iridocyclitis, left eye: Secondary | ICD-10-CM | POA: Diagnosis not present

## 2022-10-16 DIAGNOSIS — H04123 Dry eye syndrome of bilateral lacrimal glands: Secondary | ICD-10-CM | POA: Diagnosis not present

## 2022-10-19 ENCOUNTER — Ambulatory Visit (INDEPENDENT_AMBULATORY_CARE_PROVIDER_SITE_OTHER): Payer: Medicare Other | Admitting: Family Medicine

## 2022-10-19 ENCOUNTER — Encounter: Payer: Self-pay | Admitting: Family Medicine

## 2022-10-19 VITALS — BP 120/82 | HR 80 | Temp 98.0°F | Resp 18 | Ht 64.0 in | Wt 184.0 lb

## 2022-10-19 DIAGNOSIS — K5732 Diverticulitis of large intestine without perforation or abscess without bleeding: Secondary | ICD-10-CM | POA: Diagnosis not present

## 2022-10-19 DIAGNOSIS — F331 Major depressive disorder, recurrent, moderate: Secondary | ICD-10-CM | POA: Diagnosis not present

## 2022-10-19 LAB — CBC WITH DIFFERENTIAL/PLATELET
Basophils Absolute: 0.1 10*3/uL (ref 0.0–0.1)
Basophils Relative: 1.1 % (ref 0.0–3.0)
Eosinophils Absolute: 0.3 10*3/uL (ref 0.0–0.7)
Eosinophils Relative: 6.4 % — ABNORMAL HIGH (ref 0.0–5.0)
HCT: 42 % (ref 36.0–46.0)
Hemoglobin: 13.7 g/dL (ref 12.0–15.0)
Lymphocytes Relative: 31.8 % (ref 12.0–46.0)
Lymphs Abs: 1.6 10*3/uL (ref 0.7–4.0)
MCHC: 32.7 g/dL (ref 30.0–36.0)
MCV: 94.8 fl (ref 78.0–100.0)
Monocytes Absolute: 0.3 10*3/uL (ref 0.1–1.0)
Monocytes Relative: 6.7 % (ref 3.0–12.0)
Neutro Abs: 2.8 10*3/uL (ref 1.4–7.7)
Neutrophils Relative %: 54 % (ref 43.0–77.0)
Platelets: 297 10*3/uL (ref 150.0–400.0)
RBC: 4.43 Mil/uL (ref 3.87–5.11)
RDW: 14.2 % (ref 11.5–15.5)
WBC: 5.1 10*3/uL (ref 4.0–10.5)

## 2022-10-19 LAB — BASIC METABOLIC PANEL
BUN: 18 mg/dL (ref 6–23)
CO2: 31 mEq/L (ref 19–32)
Calcium: 9.9 mg/dL (ref 8.4–10.5)
Chloride: 103 mEq/L (ref 96–112)
Creatinine, Ser: 0.99 mg/dL (ref 0.40–1.20)
GFR: 55.3 mL/min — ABNORMAL LOW (ref >60.00)
Glucose, Bld: 112 mg/dL — ABNORMAL HIGH (ref 70–99)
Potassium: 4.2 mEq/L (ref 3.5–5.1)
Sodium: 141 mEq/L (ref 135–145)

## 2022-10-19 LAB — HEPATIC FUNCTION PANEL
ALT: 17 U/L (ref 0–35)
AST: 19 U/L (ref 0–37)
Albumin: 4.3 g/dL (ref 3.5–5.2)
Alkaline Phosphatase: 94 U/L (ref 39–117)
Bilirubin, Direct: 0.2 mg/dL (ref 0.0–0.3)
Total Bilirubin: 0.7 mg/dL (ref 0.2–1.2)
Total Protein: 6.8 g/dL (ref 6.0–8.3)

## 2022-10-19 MED ORDER — BUPROPION HCL ER (XL) 300 MG PO TB24: 300.0000 mg | ORAL_TABLET | Freq: Every day | ORAL | 1 refills | Status: DC

## 2022-10-19 MED ORDER — ATORVASTATIN CALCIUM 20 MG PO TABS: 20.0000 mg | ORAL_TABLET | Freq: Every day | ORAL | 1 refills | Status: DC

## 2022-10-19 MED ORDER — BUPROPION HCL ER (XL) 150 MG PO TB24: 150.0000 mg | ORAL_TABLET | Freq: Every day | ORAL | 2 refills | Status: DC

## 2022-10-19 MED ORDER — AMLODIPINE BESYLATE 5 MG PO TABS: 5.0000 mg | ORAL_TABLET | Freq: Every day | ORAL | 0 refills | Status: DC

## 2022-10-19 NOTE — Assessment & Plan Note (Signed)
Resolved.  Reviewed hospital H&P, consults, progress notes, labs, images, and d/c summary.  Pt has completed her course of Augmentin and reports feeling well.  She is able to eat and drink w/o difficulty.  Will repeat the CBC and CMP as requested in the D/C summary.

## 2022-10-19 NOTE — Progress Notes (Signed)
   Subjective:    Patient ID: Tracey Evans, female    DOB: 15-Apr-1945, 77 y.o.   MRN: [id number]  HPI Hospital f/u- pt was admitted 6/22-6/26 with severe abdominal pain.  CT showed diverticulitis w/ focally contained perforation.  She was started on IV Zosyn and bowel rest.  Thankfully sxs improved w/ conservative management and she was able to advance diet.  She was switched to Augmentin for 4 additional days for a total of 8 days abx.    D/c summary recommended CMP and CBC today.  Pt reports feeling 'fine'.  Reports feeling being able to eat w/o difficulty.  No pain.  Depression- pt reports she is again struggling w/ sxs.  'i sit and look at what I need to do but have no desire to do it'.  Currently on Wellbutrin 150mg  daily.  Review of Systems For ROS see HPI     Objective:   Physical Exam Vitals reviewed.  Constitutional:      General: She is not in acute distress.    Appearance: Normal appearance. She is well-developed. She is not ill-appearing.  HENT:     Head: Normocephalic and atraumatic.  Eyes:     Conjunctiva/sclera: Conjunctivae normal.     Pupils: Pupils are equal, round, and reactive to light.  Neck:     Thyroid: No thyromegaly.  Cardiovascular:     Rate and Rhythm: Normal rate and regular rhythm.     Heart sounds: Normal heart sounds. No murmur heard. Pulmonary:     Effort: Pulmonary effort is normal. No respiratory distress.     Breath sounds: Normal breath sounds.  Abdominal:     General: There is no distension.     Palpations: Abdomen is soft.     Tenderness: There is no abdominal tenderness. There is no guarding or rebound.  Musculoskeletal:     Cervical back: Normal range of motion and neck supple.  Lymphadenopathy:     Cervical: No cervical adenopathy.  Skin:    General: Skin is warm and dry.  Neurological:     General: No focal deficit present.     Mental Status: She is alert and oriented to person, place, and time.  Psychiatric:        Mood  and Affect: Mood normal.        Behavior: Behavior normal.        Thought Content: Thought content normal.           Assessment & Plan:

## 2022-10-19 NOTE — Patient Instructions (Signed)
Follow up in 3 months to recheck sugar We'll notify you of your lab results and make any changes if needed INCREASE the Wellbutrin to 300mg  daily- 2 of what you have at home and 1 of the new prescription Do 1/2 Gatorade 1/2 water Call with any questions or concerns Stay Safe!  Stay Healthy! Have a great summer!!

## 2022-10-19 NOTE — Assessment & Plan Note (Signed)
Deteriorated.  Will increase Wellbutrin to 300mg  daily and monitor for improvement.  Pt expressed understanding and is in agreement w/ plan.

## 2022-10-22 ENCOUNTER — Telehealth: Payer: Self-pay

## 2022-10-22 NOTE — Telephone Encounter (Signed)
-----   Message from Neena Rhymes sent at 10/22/2022  7:35 AM EDT ----- Labs look good!  No changes at this time

## 2022-10-22 NOTE — Telephone Encounter (Signed)
Pt is aware of lab results.

## 2022-10-24 ENCOUNTER — Other Ambulatory Visit: Payer: Self-pay | Admitting: Family Medicine

## 2022-10-24 DIAGNOSIS — Z1231 Encounter for screening mammogram for malignant neoplasm of breast: Secondary | ICD-10-CM

## 2022-11-13 ENCOUNTER — Other Ambulatory Visit: Payer: Self-pay | Admitting: Family Medicine

## 2022-12-04 ENCOUNTER — Telehealth: Payer: Self-pay | Admitting: Hematology and Oncology

## 2022-12-04 NOTE — Telephone Encounter (Signed)
 Rescheduled appointment per provider PAL. Left voicemail with appointment details.

## 2022-12-11 ENCOUNTER — Ambulatory Visit: Payer: Medicare Other | Admitting: Hematology and Oncology

## 2022-12-11 ENCOUNTER — Ambulatory Visit
Admission: RE | Admit: 2022-12-11 | Discharge: 2022-12-11 | Disposition: A | Discharge status: Home / Self Care | Payer: Medicare Other | Source: Ambulatory Visit | Attending: Family Medicine | Admitting: Family Medicine

## 2022-12-11 DIAGNOSIS — Z1231 Encounter for screening mammogram for malignant neoplasm of breast: Secondary | ICD-10-CM | POA: Diagnosis not present

## 2022-12-13 ENCOUNTER — Other Ambulatory Visit (INDEPENDENT_AMBULATORY_CARE_PROVIDER_SITE_OTHER): Payer: Medicare Other

## 2022-12-13 ENCOUNTER — Ambulatory Visit (INDEPENDENT_AMBULATORY_CARE_PROVIDER_SITE_OTHER): Payer: Medicare Other | Admitting: Family Medicine

## 2022-12-13 ENCOUNTER — Encounter: Payer: Self-pay | Admitting: Family Medicine

## 2022-12-13 VITALS — BP 124/70 | HR 71 | Temp 97.9°F | Resp 18 | Ht 64.0 in | Wt 180.5 lb

## 2022-12-13 DIAGNOSIS — R413 Other amnesia: Secondary | ICD-10-CM

## 2022-12-13 DIAGNOSIS — E119 Type 2 diabetes mellitus without complications: Secondary | ICD-10-CM | POA: Diagnosis not present

## 2022-12-13 LAB — CBC WITH DIFFERENTIAL/PLATELET
Basophils Absolute: 0.1 10*3/uL (ref 0.0–0.1)
Basophils Relative: 0.9 % (ref 0.0–3.0)
Eosinophils Absolute: 0.2 10*3/uL (ref 0.0–0.7)
Eosinophils Relative: 2.8 % (ref 0.0–5.0)
HCT: 44.4 % (ref 36.0–46.0)
Hemoglobin: 14.3 g/dL (ref 12.0–15.0)
Lymphocytes Relative: 34.4 % (ref 12.0–46.0)
Lymphs Abs: 2.2 10*3/uL (ref 0.7–4.0)
MCHC: 32.1 g/dL (ref 30.0–36.0)
MCV: 94 fl (ref 78.0–100.0)
Monocytes Absolute: 0.5 10*3/uL (ref 0.1–1.0)
Monocytes Relative: 7 % (ref 3.0–12.0)
Neutro Abs: 3.6 10*3/uL (ref 1.4–7.7)
Neutrophils Relative %: 54.9 % (ref 43.0–77.0)
Platelets: 290 10*3/uL (ref 150.0–400.0)
RBC: 4.72 Mil/uL (ref 3.87–5.11)
RDW: 14.3 % (ref 11.5–15.5)
WBC: 6.5 10*3/uL (ref 4.0–10.5)

## 2022-12-13 LAB — HEMOGLOBIN A1C: Hgb A1c MFr Bld: 6.4 % (ref 4.6–6.5)

## 2022-12-13 NOTE — Patient Instructions (Signed)
Follow up as needed or as scheduled We'll notify you of your lab results and make any changes if needed Keep up the good work!  You look great! I don't think you are having memory issues- I think you're functioning on overload Try and stay present when having a conversation to limit repeating yourself Call with any questions or concerns Hang in there!!!

## 2022-12-13 NOTE — Progress Notes (Signed)
   Subjective:    Patient ID: Tracey Evans, female    DOB: March 09, 1946, 77 y.o.   MRN: [id number]  HPI Memory issues- pt reports she will put things down and not be able to find them in plain sight.  Denies forgetfulness.  Pt has been under considerable stress.  Reports she will repeat things or lose her train of thought.  DM- chronic problem.  Attempting to control w/ diet and exercise.  Not currently on medication.  UTD on foot exam.  Due for eye exam and microalbumin.  Currently on Lipitor for CVD protection.     Review of Systems For ROS see HPI     Objective:   Physical Exam Vitals reviewed.  Constitutional:      General: She is not in acute distress.    Appearance: Normal appearance. She is well-developed. She is not ill-appearing.  HENT:     Head: Normocephalic and atraumatic.  Eyes:     Conjunctiva/sclera: Conjunctivae normal.     Pupils: Pupils are equal, round, and reactive to light.  Neck:     Thyroid: No thyromegaly.  Cardiovascular:     Rate and Rhythm: Normal rate and regular rhythm.     Heart sounds: Normal heart sounds. No murmur heard. Pulmonary:     Effort: Pulmonary effort is normal. No respiratory distress.     Breath sounds: Normal breath sounds.  Abdominal:     General: There is no distension.     Palpations: Abdomen is soft.     Tenderness: There is no abdominal tenderness.  Musculoskeletal:     Cervical back: Normal range of motion and neck supple.     Right lower leg: No edema.     Left lower leg: No edema.  Lymphadenopathy:     Cervical: No cervical adenopathy.  Skin:    General: Skin is warm and dry.  Neurological:     General: No focal deficit present.     Mental Status: She is alert and oriented to person, place, and time.  Psychiatric:        Mood and Affect: Mood normal.        Behavior: Behavior normal.        Thought Content: Thought content normal.           Assessment & Plan:  Memory change- new.  pt's memory is fine  and she is speaking to me very fluently and w/o difficulty.  We discussed that she's been functioning on fight or flight mode for so long that she's just overwhelmed.  She's not forgetting, she's not focused on the conversation she is having- her brain is 2 steps ahead- which is why she's repeating herself.  Encouraged her to settle into her new routine, try and be more present in the moment, and if things don't improve, we will investigate further.  Total time spent w/ pt 29 minutes.  Pt expressed understanding and is in agreement w/ plan.

## 2022-12-13 NOTE — Assessment & Plan Note (Signed)
Ongoing issue.  Pt is attempting to control w/ diet and exercise and is not currently on medication.  UTD on foot exam.  Will get microalbumin today.  Encouraged her to schedule eye exam.  Check labs.  Adjust meds prn

## 2022-12-14 ENCOUNTER — Telehealth: Payer: Self-pay

## 2022-12-14 LAB — BASIC METABOLIC PANEL
BUN: 17 mg/dL (ref 6–23)
CO2: 31 meq/L (ref 19–32)
Calcium: 10 mg/dL (ref 8.4–10.5)
Chloride: 103 meq/L (ref 96–112)
Creatinine, Ser: 1.16 mg/dL (ref 0.40–1.20)
GFR: 45.67 mL/min — ABNORMAL LOW (ref >60.00)
Glucose, Bld: 102 mg/dL — ABNORMAL HIGH (ref 70–99)
Potassium: 4.6 meq/L (ref 3.5–5.1)
Sodium: 141 meq/L (ref 135–145)

## 2022-12-14 LAB — HEPATIC FUNCTION PANEL
ALT: 17 U/L (ref 0–35)
AST: 17 U/L (ref 0–37)
Albumin: 4.2 g/dL (ref 3.5–5.2)
Alkaline Phosphatase: 99 U/L (ref 39–117)
Bilirubin, Direct: 0.1 mg/dL (ref 0.0–0.3)
Total Bilirubin: 0.7 mg/dL (ref 0.2–1.2)
Total Protein: 6.8 g/dL (ref 6.0–8.3)

## 2022-12-14 LAB — LIPID PANEL
Cholesterol: 126 mg/dL (ref 0–200)
HDL: 54.4 mg/dL (ref >39.00)
LDL Cholesterol: 55 mg/dL (ref 0–99)
NonHDL: 71.45
Total CHOL/HDL Ratio: 2
Triglycerides: 83 mg/dL (ref 0.0–149.0)
VLDL: 16.6 mg/dL (ref 0.0–40.0)

## 2022-12-14 LAB — MICROALBUMIN / CREATININE URINE RATIO
Creatinine,U: 57.1 mg/dL
Microalb Creat Ratio: 1.2 mg/g (ref 0.0–30.0)
Microalb, Ur: <0.7 mg/dL (ref 0.0–1.9)

## 2022-12-14 LAB — TSH: TSH: 1.38 u[IU]/mL (ref 0.35–5.50)

## 2022-12-14 NOTE — Telephone Encounter (Signed)
-----   Message from Neena Rhymes sent at 12/14/2022  3:07 PM EDT ----- Labs look great!  No changes at this time

## 2022-12-14 NOTE — Telephone Encounter (Signed)
Pt is aware of lab results.

## 2022-12-20 ENCOUNTER — Encounter: Payer: Self-pay | Admitting: Family Medicine

## 2022-12-24 ENCOUNTER — Ambulatory Visit (INDEPENDENT_AMBULATORY_CARE_PROVIDER_SITE_OTHER): Payer: Medicare Other | Admitting: Family Medicine

## 2022-12-24 ENCOUNTER — Encounter: Payer: Self-pay | Admitting: Family Medicine

## 2022-12-24 VITALS — BP 118/76 | HR 86 | Temp 98.1°F | Ht 64.0 in | Wt 177.8 lb

## 2022-12-24 DIAGNOSIS — R059 Cough, unspecified: Secondary | ICD-10-CM | POA: Diagnosis not present

## 2022-12-24 DIAGNOSIS — U071 COVID-19: Secondary | ICD-10-CM | POA: Diagnosis not present

## 2022-12-24 LAB — POC COVID19 BINAXNOW: SARS Coronavirus 2 Ag: POSITIVE — AB

## 2022-12-24 MED ORDER — BENZONATATE 100 MG PO CAPS: 100.0000 mg | ORAL_CAPSULE | Freq: Three times a day (TID) | ORAL | 0 refills | Status: DC | PRN

## 2022-12-24 MED ORDER — NIRMATRELVIR/RITONAVIR (PAXLOVID) TABLET (RENAL DOSING): 2.0000 | ORAL_TABLET | Freq: Two times a day (BID) | ORAL | 0 refills | Status: AC

## 2022-12-24 NOTE — Patient Instructions (Signed)
Follow up as needed or as scheduled Start the Paxlovid to help decrease the severity and duration of COVID.  (You don't have to take this, but if you do, make sure you start it in the first 5 days) HOLD the Atorvastatin (Lipitor) if you take the Paxlovid.  Restart once you are done with the antiviral medication Drink LOTS of fluids USE the cough pills as needed ADD Robitussin or Delsym as needed for cough Call with any questions or concerns Hang in there!

## 2022-12-24 NOTE — Progress Notes (Signed)
Subjective:    Patient ID: Tracey Evans, female    DOB: Feb 26, 1946, 77 y.o.   MRN: [id number]  HPI Cough- pt's daughter recently had COVID and they share a 1 bedroom apartment.  Sxs started Saturday.  + weakness, body aches.  No HA.  Cough is wet but not productive.     Review of Systems For ROS see HPI     Objective:   Physical Exam Vitals reviewed.  Constitutional:      General: She is not in acute distress.    Appearance: Normal appearance. She is not ill-appearing.  HENT:     Head: Normocephalic and atraumatic.     Nose: Nose normal. No congestion.  Eyes:     Extraocular Movements: Extraocular movements intact.     Conjunctiva/sclera: Conjunctivae normal.  Cardiovascular:     Rate and Rhythm: Normal rate and regular rhythm.  Pulmonary:     Effort: Pulmonary effort is normal. No respiratory distress.     Breath sounds: Normal breath sounds. No wheezing or rhonchi.  Musculoskeletal:     Cervical back: Neck supple.  Lymphadenopathy:     Cervical: No cervical adenopathy.  Skin:    General: Skin is warm and dry.  Neurological:     General: No focal deficit present.     Mental Status: She is alert and oriented to person, place, and time.  Psychiatric:        Mood and Affect: Mood normal.        Behavior: Behavior normal.        Thought Content: Thought content normal.           Assessment & Plan:  COVID- new.  Pt's daughter just recently had COVID and pt developed cough on Saturday.  In office test immediately +.  Pt frustrated b/c she says w/ exception of cough she feels 'fine'.  Will start Tessalon prn since pt is allergic to codeine.  Paxlovid prescription written along w/ instructions to hold statin if she decides to take medication.  Reviewed supportive care and red flags that should prompt return.  Pt expressed understanding and is in agreement w/ plan.

## 2022-12-25 ENCOUNTER — Telehealth: Payer: Self-pay

## 2022-12-25 NOTE — Telephone Encounter (Signed)
Pt called to report she tested positive for COVID 12/25/22. Pt reports sx onset 9/15. She was prescribed Paxlovid 12/25/22. Advised she would need to r/s her appt for 9/20 for 21 days from sx onset. Message sent to scheduling.

## 2022-12-26 ENCOUNTER — Telehealth: Payer: Self-pay | Admitting: Hematology and Oncology

## 2022-12-28 ENCOUNTER — Inpatient Hospital Stay: Payer: Medicare Other | Admitting: Hematology and Oncology

## 2023-01-18 ENCOUNTER — Other Ambulatory Visit: Payer: Self-pay

## 2023-01-18 ENCOUNTER — Encounter: Payer: Self-pay | Admitting: *Deleted

## 2023-01-18 ENCOUNTER — Inpatient Hospital Stay: Payer: Medicare Other | Attending: Hematology and Oncology | Admitting: Hematology and Oncology

## 2023-01-18 ENCOUNTER — Emergency Department (HOSPITAL_COMMUNITY)
Admission: EM | Admit: 2023-01-18 | Discharge: 2023-01-18 | Disposition: A | Discharge status: Home / Self Care | Payer: Medicare Other

## 2023-01-18 ENCOUNTER — Emergency Department (HOSPITAL_COMMUNITY): Payer: Medicare Other

## 2023-01-18 VITALS — BP 132/81 | HR 84 | Temp 97.2°F | Resp 18 | Ht 64.0 in | Wt 175.7 lb

## 2023-01-18 DIAGNOSIS — Z170 Estrogen receptor positive status [ER+]: Secondary | ICD-10-CM | POA: Diagnosis not present

## 2023-01-18 DIAGNOSIS — S025XXA Fracture of tooth (traumatic), initial encounter for closed fracture: Secondary | ICD-10-CM | POA: Diagnosis not present

## 2023-01-18 DIAGNOSIS — E1165 Type 2 diabetes mellitus with hyperglycemia: Secondary | ICD-10-CM | POA: Diagnosis not present

## 2023-01-18 DIAGNOSIS — Z79899 Other long term (current) drug therapy: Secondary | ICD-10-CM | POA: Insufficient documentation

## 2023-01-18 DIAGNOSIS — C50511 Malignant neoplasm of lower-outer quadrant of right female breast: Secondary | ICD-10-CM

## 2023-01-18 DIAGNOSIS — W19XXXA Unspecified fall, initial encounter: Secondary | ICD-10-CM | POA: Diagnosis not present

## 2023-01-18 DIAGNOSIS — R8289 Other abnormal findings on cytological and histological examination of urine: Secondary | ICD-10-CM | POA: Insufficient documentation

## 2023-01-18 DIAGNOSIS — S0993XA Unspecified injury of face, initial encounter: Secondary | ICD-10-CM

## 2023-01-18 DIAGNOSIS — S0081XA Abrasion of other part of head, initial encounter: Secondary | ICD-10-CM | POA: Diagnosis not present

## 2023-01-18 DIAGNOSIS — Z7982 Long term (current) use of aspirin: Secondary | ICD-10-CM | POA: Diagnosis not present

## 2023-01-18 DIAGNOSIS — S0990XA Unspecified injury of head, initial encounter: Secondary | ICD-10-CM | POA: Diagnosis not present

## 2023-01-18 DIAGNOSIS — N390 Urinary tract infection, site not specified: Secondary | ICD-10-CM | POA: Insufficient documentation

## 2023-01-18 DIAGNOSIS — R55 Syncope and collapse: Secondary | ICD-10-CM | POA: Insufficient documentation

## 2023-01-18 DIAGNOSIS — Z91040 Latex allergy status: Secondary | ICD-10-CM | POA: Insufficient documentation

## 2023-01-18 LAB — URINALYSIS, ROUTINE W REFLEX MICROSCOPIC
Bacteria, UA: NONE SEEN
Bilirubin Urine: NEGATIVE
Glucose, UA: NEGATIVE mg/dL
Ketones, ur: NEGATIVE mg/dL
Nitrite: NEGATIVE
Protein, ur: 100 mg/dL — AB
Specific Gravity, Urine: 1.03 (ref 1.005–1.030)
WBC, UA: >50 WBC/hpf (ref 0–5)
pH: 5 (ref 5.0–8.0)

## 2023-01-18 LAB — CBC
HCT: 41.5 % (ref 36.0–46.0)
Hemoglobin: 13.1 g/dL (ref 12.0–15.0)
MCH: 30.5 pg (ref 26.0–34.0)
MCHC: 31.6 g/dL (ref 30.0–36.0)
MCV: 96.7 fL (ref 80.0–100.0)
Platelets: 235 10*3/uL (ref 150–400)
RBC: 4.29 MIL/uL (ref 3.87–5.11)
RDW: 14.6 % (ref 11.5–15.5)
WBC: 7.5 10*3/uL (ref 4.0–10.5)
nRBC: 0 % (ref 0.0–0.2)

## 2023-01-18 LAB — BASIC METABOLIC PANEL
Anion gap: 9 (ref 5–15)
BUN: 20 mg/dL (ref 8–23)
CO2: 27 mmol/L (ref 22–32)
Calcium: 9.3 mg/dL (ref 8.9–10.3)
Chloride: 105 mmol/L (ref 98–111)
Creatinine, Ser: 0.96 mg/dL (ref 0.44–1.00)
GFR, Estimated: >60 mL/min (ref >60)
Glucose, Bld: 113 mg/dL — ABNORMAL HIGH (ref 70–99)
Potassium: 4.5 mmol/L (ref 3.5–5.1)
Sodium: 141 mmol/L (ref 135–145)

## 2023-01-18 LAB — CBG MONITORING, ED: Glucose-Capillary: 101 mg/dL — ABNORMAL HIGH (ref 70–99)

## 2023-01-18 MED ORDER — CEPHALEXIN 500 MG PO CAPS: 500.0000 mg | ORAL_CAPSULE | Freq: Four times a day (QID) | ORAL | 0 refills | Status: DC

## 2023-01-18 MED ORDER — ACETAMINOPHEN 325 MG PO TABS
650.0000 mg | ORAL_TABLET | Freq: Once | ORAL | Status: AC
  Administered 2023-01-18: 650 mg via ORAL
  Filled 2023-01-18: qty 2

## 2023-01-18 MED ORDER — MAGIC MOUTHWASH: 5.0000 mL | Freq: Three times a day (TID) | ORAL | 0 refills | Status: DC

## 2023-01-18 NOTE — ED Provider Notes (Signed)
[place] EMERGENCY DEPARTMENT AT [facility] Provider Note   CSN: [id number] Arrival date & time: 01/18/23  1149     History  Chief Complaint  Patient presents with   Dizziness   Fall    Tracey Evans is a 77 y.o. female with medical problems, chronic low back pain, GERD, type 2 diabetes, pulmonary cancer of right breast.  Patient presents to ED for evaluation of dizziness and resultant fall.  Patient reports that she had a appointment at cancer center today.  States that she was walking back to her car when she felt as if "my legs were going faster than I could keep up with".  She states that when she needs medicine she felt lightheaded and dizzy.  Reports that the lightheadedness and dizziness caused her to fall landing on her right side striking right side of her face/head on concrete.  She denies blood thinning medication.  She denies loss of consciousness or blacking out.  States that she was aware the entire event.  Denies preceding chest pain or shortness of breath.  Denies any recent fevers, nausea, vomiting, diarrhea, abdominal pain, one-sided weakness or numbness.  Reports that she currently feels back to her baseline.  Her top lip is swollen however denies that she feels as if any teeth are loose or out of place.   Dizziness Fall       Home Medications Prior to Admission medications   Medication Sig Start Date End Date Taking? Authorizing Provider  cephALEXin (KEFLEX) 500 MG capsule Take 1 capsule (500 mg total) by mouth 4 (four) times daily. 01/18/23  Yes Al Decant, PA-C  amLODipine (NORVASC) 5 MG tablet Take 1 tablet (5 mg total) by mouth daily. 10/19/22   Sheliah Hatch, MD  aspirin 81 MG tablet Take 81 mg by mouth daily.    [provider]  atorvastatin (LIPITOR) 20 MG tablet Take 1 tablet (20 mg total) by mouth daily. 10/19/22   Sheliah Hatch, MD  benzonatate (TESSALON PERLES) 100 MG capsule Take 1 capsule (100 mg total)  by mouth 3 (three) times daily as needed for cough. 12/24/22   Sheliah Hatch, MD  buPROPion (WELLBUTRIN XL) 300 MG 24 hr tablet Take 1 tablet (300 mg total) by mouth daily. 10/19/22   Sheliah Hatch, MD  gabapentin (NEURONTIN) 300 MG capsule TAKE 1 CAPSULE IN THE MORNING , 1 IN AFTERNOON AND 2 CAPSULES BEFORE BED Patient taking differently: Take 300-600 mg by mouth 3 (three) times daily. TAKE 1 CAPSULE IN THE MORNING , 1 IN AFTERNOON AND 2 CAPSULES BEFORE BED 09/19/22   Sheliah Hatch, MD  Glycerin-Polysorbate 80 (REFRESH DRY EYE THERAPY OP) Place 1 drop into both eyes 3 (three) times daily.    [provider]  levothyroxine (SYNTHROID) 75 MCG tablet TAKE 1 TABLET BY MOUTH EVERY DAY BEFORE BREAKFAST 10/05/22   Sheliah Hatch, MD  magic mouthwash SOLN Take 5 mLs by mouth 3 (three) times daily. Suspension contains equal amounts of Maalox Extra Strength, nystatin, and diphenhydramine. 01/18/23   Al Decant, PA-C  Magnesium 400 MG CAPS Take by mouth 2 (two) times daily.    [provider]  meloxicam (MOBIC) 15 MG tablet TAKE 1 TABLET (15 MG TOTAL) BY MOUTH DAILY. 11/14/22   Sheliah Hatch, MD  omeprazole (PRILOSEC) 20 MG capsule TAKE 1 CAPSULE BY MOUTH EVERY DAY Patient taking differently: Take 20 mg by mouth daily. 09/10/22   Sheliah Hatch,  MD  polyethylene glycol powder (MIRALAX) 17 GM/SCOOP powder Take 17 g by mouth 2 (two) times daily as needed for mild constipation. 10/03/22   Almon Hercules, MD  traZODone (DESYREL) 50 MG tablet Take 50 mg by mouth at bedtime. 03/25/22   [provider]      Allergies    Contrast media [iodinated contrast media], Dilaudid [hydromorphone hcl], Adhesive [tape], Codeine, Cymbalta [duloxetine hcl], Lactose intolerance (gi), Latex, Nsaids, and Lyrica [pregabalin]    Review of Systems   Review of Systems  Neurological:  Positive for dizziness.  All other systems reviewed and are negative.   Physical  Exam Updated Vital Signs BP 131/85   Pulse 72   Temp (!) 97.4 F (36.3 C)   Resp 17   SpO2 98%  Physical Exam Vitals and nursing note reviewed.  Constitutional:      General: She is not in acute distress.    Appearance: Normal appearance. She is not ill-appearing, toxic-appearing or diaphoretic.  HENT:     Head: Normocephalic and atraumatic.     Comments: No tenderness to patient nasal bridge, periorbital tenderness.  EOMs intact and nonpainful.  Small superficial abrasion to right side of patient face.    Nose: Nose normal.     Mouth/Throat:     Mouth: Mucous membranes are moist.     Pharynx: Oropharynx is clear.      Comments: Bilateral central incisors chipped to upper row of teeth.  Eyes:     Extraocular Movements: Extraocular movements intact.     Conjunctiva/sclera: Conjunctivae normal.     Pupils: Pupils are equal, round, and reactive to light.  Cardiovascular:     Rate and Rhythm: Normal rate and regular rhythm.  Pulmonary:     Effort: Pulmonary effort is normal.     Breath sounds: Normal breath sounds. No wheezing.  Abdominal:     General: Abdomen is flat. Bowel sounds are normal.     Palpations: Abdomen is soft.     Tenderness: There is no abdominal tenderness.  Musculoskeletal:     Cervical back: Normal range of motion and neck supple. No tenderness.  Skin:    General: Skin is warm and dry.     Capillary Refill: Capillary refill takes less than 2 seconds.  Neurological:     Mental Status: She is alert and oriented to person, place, and time.     Comments: Reassuring neurological examination without focal neurodeficits       ED Results / Procedures / Treatments   Labs (all labs ordered are listed, but only abnormal results are displayed) Labs Reviewed  BASIC METABOLIC PANEL - Abnormal; Notable for the following components:      Result Value   Glucose, Bld 113 (*)    All other components within normal limits  URINALYSIS, ROUTINE W REFLEX MICROSCOPIC -  Abnormal; Notable for the following components:   Color, Urine AMBER (*)    APPearance CLOUDY (*)    Hgb urine dipstick SMALL (*)    Protein, ur 100 (*)    Leukocytes,Ua LARGE (*)    Non Squamous Epithelial 6-10 (*)    All other components within normal limits  CBG MONITORING, ED - Abnormal; Notable for the following components:   Glucose-Capillary 101 (*)    All other components within normal limits  URINE CULTURE  CBC    EKG EKG Interpretation Date/Time:  Friday January 18 2023 12:02:13 EDT Ventricular Rate:  73 PR Interval:  178 QRS Duration:  87 QT Interval:  381 QTC Calculation: 420 R Axis:   39  Text Interpretation: Sinus rhythm Low voltage, precordial leads Abnormal R-wave progression, early transition Confirmed by Estanislado Pandy [id number]) on 01/18/2023 12:10:20 PM  Radiology CT Head Wo Contrast  Result Date: 01/18/2023 CLINICAL DATA:  Head trauma, moderate-severe EXAM: CT HEAD WITHOUT CONTRAST TECHNIQUE: Contiguous axial images were obtained from the base of the skull through the vertex without intravenous contrast. RADIATION DOSE REDUCTION: This exam was performed according to the departmental dose-optimization program which includes automated exposure control, adjustment of the mA and/or kV according to patient size and/or use of iterative reconstruction technique. COMPARISON:  12/16/2007 FINDINGS: Brain: No evidence of acute infarction, hemorrhage, hydrocephalus, extra-axial collection or mass lesion/mass effect. Vascular: No hyperdense vessel or unexpected calcification. Skull: Normal. Negative for fracture or focal lesion. Sinuses/Orbits: No acute finding. Other: None. IMPRESSION: No acute intracranial findings. Electronically Signed   By: Duanne Guess D.O.   On: 01/18/2023 14:31    Procedures Procedures   Medications Ordered in ED Medications  acetaminophen (TYLENOL) tablet 650 mg (650 mg Oral Given 01/18/23 1517)    ED Course/ Medical Decision Making/  A&P Clinical Course as of 01/18/23 1517  Fri Jan 18, 2023  1339 Patient endorsing discomfort with urination which she attributed to dehydration, UA concerning for infection, will culture and start on keflex [CG]    Clinical Course User Index [CG] Al Decant, PA-C    Medical Decision Making Amount and/or Complexity of Data Reviewed Labs: ordered.   77 year old female presents to the ED for evaluation.  Please see HPI for further details.  On examination the patient is afebrile and nontachycardic.  Her lung sounds are clear bilaterally, she is not hypoxic.  No abdominal tenderness.  Neurological examination at baseline without focal deficits.  Patient does have bilateral incisors that appear chipped however in place.  No tenderness to patient nasal bridge, periorbital tenderness.  Slight superficial abrasion to right side of patient face.  Patient CBC without leukocytosis or anemia.  Metabolic panel with glucose 113, no other derangement.  Patient urinalysis shows large leukocytes, protein small hemoglobin and the patient does endorse some dysuria so started on Keflex.  CT scan of patient head unremarkable.  EKG is also unremarkable.  At this time, patient states she feels at her baseline.  Will discharge her and have her follow-up with PCP this week.  Will have her start on Keflex for UTI, have cultured the patient urine as well.  Have also referred patient to dentistry and she states that she will follow-up with her dental provider in town.  She denies any other concerns.  She is stable to discharge.   Final Clinical Impression(s) / ED Diagnoses Final diagnoses:  Near syncope  Lower urinary tract infectious disease  Injury of tooth, initial encounter    Rx / DC Orders ED Discharge Orders          Ordered    cephALEXin (KEFLEX) 500 MG capsule  4 times daily        01/18/23 1348    magic mouthwash SOLN  3 times daily,   Status:  Discontinued        01/18/23 1516     magic mouthwash SOLN  3 times daily        01/18/23 1517              Al Decant, PA-C 01/18/23 1518    Coral Spikes, DO 01/23/23 [phone]

## 2023-01-18 NOTE — ED Triage Notes (Signed)
Pt had left oncology appt and was walking back to car in the [facility] parking lot wen they started to get dizzy and fell, hitting their head. No LOC, not on blood thinners. Pt does have some blood in their mouth- and notes the only pain is in her mouth.  Staff retrieved pt and brought them into the ED on a stretcher.  AOx4

## 2023-01-18 NOTE — Discharge Instructions (Signed)
It was a pleasure taking part in your care today.  As we discussed, it appears that you have a UTI.  Please begin taking Keflex for the next 7 days 4 times a day.  Please follow-up with your PCP this week as well for further care.  Please utilize list of dental resources and follow-up with dentists in the area to repair your teeth.  Please return to the ED with any new or worsening signs or symptoms.

## 2023-01-18 NOTE — Progress Notes (Signed)
Patient Care Team: Sheliah Hatch, MD as PCP - General (Family Medicine) Rinaldo Cloud, MD as Consulting Physician (Cardiology) Chalmers Guest, MD as Consulting Physician (Ophthalmology) Serena Croissant, MD as Consulting Physician (Hematology and Oncology) Dorothy Puffer, MD as Consulting Physician (Radiation Oncology) Claud Kelp, MD as Consulting Physician (General Surgery) Gaylord Shih, [place] (Specialist) Sheran Luz, MD as Consulting Physician (Physical Medicine and Rehabilitation) Axel Filler, Larna Daughters, NP as Nurse Practitioner (Hematology and Oncology)  DIAGNOSIS:  Encounter Diagnosis  Name Primary?   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, heterozygous   08/15/2015 Surgery   Right mastectomy: IDC grade 2, 2.2 cm, with associated DCIS intermediate grade, separate focus high-grade DCIS, 0/4 lymph nodes negative, T2 N0 stage II a, ER 90%, PR 5%, HER-2 negative duration 1.42, Ki-67 60%   09/22/2015 - 01/05/2016 Chemotherapy   Adjuvant chemotherapy with TCH 5 cycles followed by Herceptin maintenance for 1 year   03/15/2016 -  Anti-estrogen oral therapy   Anastrozole then tamoxifen and then Letrozole daily (stopped due to side effects) 11/02/17 restarted on 12/03/17 on Letrozole     CHIEF COMPLIANT: Surveillance of breast cancer  History of Present Illness   The patient, with a history of breast cancer on surveillance, had a recent  hospitalization for suspected bowel perforation, presents for follow-up. She reports feeling 'wonderful' and is able to eat 'anything I want.' She has been cautious with her diet since the hospitalization. She denies any current abdominal discomfort.  The patient also reports a significant weight loss of 8 pounds, which is noticeable in her facial structures. She has been working on weight loss and is pleased with her progress.  In addition to the above, the patient reports some soreness in the chest area, specifically pointing to a muscle. No further details about this discomfort are provided in the conversation.        ALLERGIES:  is allergic to contrast media [iodinated contrast media], dilaudid [hydromorphone hcl], adhesive [tape], codeine, cymbalta [duloxetine hcl], lactose intolerance (gi), latex, nsaids, and lyrica [pregabalin].  MEDICATIONS:  Current Outpatient Medications  Medication Sig Dispense Refill   amLODipine (NORVASC) 5 MG tablet Take 1 tablet (5 mg total) by mouth daily. 30 tablet 0   aspirin 81 MG tablet Take 81 mg by mouth daily.     atorvastatin (LIPITOR) 20 MG tablet Take 1 tablet (20 mg total) by mouth daily. 90 tablet 1   benzonatate (TESSALON PERLES) 100 MG capsule Take 1 capsule (100 mg total) by mouth 3 (three) times daily as needed for cough. 60 capsule 0   buPROPion (WELLBUTRIN XL) 300 MG 24 hr tablet Take 1 tablet (300 mg total) by mouth daily. 90 tablet 1   gabapentin (NEURONTIN) 300 MG capsule TAKE 1 CAPSULE IN THE MORNING , 1 IN AFTERNOON AND 2 CAPSULES BEFORE BED (Patient taking differently: Take 300-600 mg by mouth 3 (three) times daily. TAKE 1 CAPSULE IN THE MORNING , 1 IN AFTERNOON AND 2 CAPSULES BEFORE BED) 120 capsule 3  Glycerin-Polysorbate 80 (REFRESH DRY EYE THERAPY OP) Place 1 drop into both eyes 3 (three) times daily.     levothyroxine (SYNTHROID) 75 MCG tablet TAKE 1 TABLET BY MOUTH EVERY DAY BEFORE BREAKFAST 90 tablet 1   Magnesium 400 MG CAPS Take  by mouth 2 (two) times daily.     meloxicam (MOBIC) 15 MG tablet TAKE 1 TABLET (15 MG TOTAL) BY MOUTH DAILY. 30 tablet 3   omeprazole (PRILOSEC) 20 MG capsule TAKE 1 CAPSULE BY MOUTH EVERY DAY (Patient taking differently: Take 20 mg by mouth daily.) 90 capsule 1   polyethylene glycol powder (MIRALAX) 17 GM/SCOOP powder Take 17 g by mouth 2 (two) times daily as needed for mild constipation. 255 g 1   traZODone (DESYREL) 50 MG tablet Take 50 mg by mouth at bedtime.     No current facility-administered medications for this visit.    PHYSICAL EXAMINATION: ECOG PERFORMANCE STATUS: 1 - Symptomatic but completely ambulatory  Vitals:   01/18/23 1053  BP: 132/81  Pulse: 84  Resp: 18  Temp: (!) 97.2 F (36.2 C)  SpO2: 98%   Filed Weights   01/18/23 1053  Weight: 175 lb 11.2 oz (79.7 kg)     LABORATORY DATA:  I have reviewed the data as listed    Latest Ref Rng & Units 12/13/2022    2:25 PM 10/19/2022   11:36 AM 10/03/2022    8:22 AM  CMP  Glucose 70 - 99 mg/dL 409  811  94   BUN 6 - 23 mg/dL 17  18  7    Creatinine 0.40 - 1.20 mg/dL 9.14  7.82  9.56   Sodium 135 - 145 mEq/L 141  141  139   Potassium 3.5 - 5.1 mEq/L 4.6  4.2  4.1   Chloride 96 - 112 mEq/L 103  103  107   CO2 19 - 32 mEq/L 31  31  26    Calcium 8.4 - 10.5 mg/dL 21.3  9.9  8.7   Total Protein 6.0 - 8.3 g/dL 6.8  6.8    Total Bilirubin 0.2 - 1.2 mg/dL 0.7  0.7    Alkaline Phos 39 - 117 U/L 99  94    AST 0 - 37 U/L 17  19    ALT 0 - 35 U/L 17  17      Lab Results  Component Value Date   WBC 6.5 12/13/2022   HGB 14.3 12/13/2022   HCT 44.4 12/13/2022   MCV 94.0 12/13/2022   PLT 290.0 12/13/2022   NEUTROABS 3.6 12/13/2022    ASSESSMENT & PLAN:  Breast cancer of lower-outer quadrant of right female breast (HCC) Right mastectomy 08/15/2015: IDC grade 2, 2.2 cm, with associated DCIS intermediate grade, separate focus high-grade DCIS, 0/4 lymph nodes negative, T2 N0 stage II a, ER 90%, PR 5%, HER-2 negative duration  1.42, Ki-67 60%   Treatment plan: 1. Adjuvant chemotherapy with TCH 2. Followed by adjuvant antiestrogen therapy with anastrozole 5 years No role of radiation since she had mastectomy. ----------------------------------------------------------------------------------------------------------------------- Prior treatment: Completed 5 cycles of TCH, chemotherapy discontinued for neuropathy. Completed Herceptin maintenance 09/29/2006    Acute Diverticulitis: ED visit 02/22/16 Shingles: Resolved   Plan: 1.Anastrozole 1 mg daily started in 03/15/16 discontinued 04/25/2016 because of pain in hands and feet, switched her to tamoxifen therapy. But she stopped taking tamoxifen as well. 3. Started letrozole 07/31/2016 stopped July 2019, resumed August 2019 4.  Tried exemestane but discontinued in 2022 because of rash  CT abdomen and pelvis 07/30/2016: Normal study CT abdomen pelvis 09/29/2022: Small bowel acute diverticulitis with signs of focal contained perforation Hospitalization 09/29/2022-10/03/2022: Admitted for distal small bowel diverticulitis with focally contained perforation improved with conservative treatment      Emotional distress/body dysmorphic disorder: Hospitalization for abdominal pain June 2020: CT of the abdomen did not reveal any cause.    Breast cancer surveillance: 1.  Breast exam 12/08/2021: Benign left breast.  Right mastectomy. 2. 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  Patient is interested in participating in the shine study  No orders of the defined types were placed in this encounter.  The patient has a good understanding of the overall plan. she agrees with it. she will call with any problems that may develop before the next visit here. Total time spent: 30 mins including face to face time and time spent  for planning, charting and co-ordination of care   Tamsen Meek, MD 01/18/23

## 2023-01-18 NOTE — Assessment & Plan Note (Addendum)
Right mastectomy 08/15/2015: IDC grade 2, 2.2 cm, with associated DCIS intermediate grade, separate focus high-grade DCIS, 0/4 lymph nodes negative, T2 N0 stage II a, ER 90%, PR 5%, HER-2 negative duration 1.42, Ki-67 60%   Treatment plan: 1. Adjuvant chemotherapy with TCH 2. Followed by adjuvant antiestrogen therapy with anastrozole 5 years No role of radiation since she had mastectomy. ----------------------------------------------------------------------------------------------------------------------- Prior treatment: Completed 5 cycles of TCH, chemotherapy discontinued for neuropathy. Completed Herceptin maintenance 09/29/2006    Acute Diverticulitis: ED visit 02/22/16 Shingles: Resolved   Plan: 1.Anastrozole 1 mg daily started in 03/15/16 discontinued 04/25/2016 because of pain in hands and feet, switched her to tamoxifen therapy. But she stopped taking tamoxifen as well. 3. Started letrozole 07/31/2016 stopped July 2019, resumed August 2019 4.  Tried exemestane but discontinued in 2022 because of rash   CT abdomen and pelvis 07/30/2016: Normal study CT abdomen pelvis 09/29/2022: Small bowel acute diverticulitis with signs of focal contained perforation Hospitalization 09/29/2022-10/03/2022: Admitted for distal small bowel diverticulitis with focally contained perforation improved with conservative treatment      Emotional distress/body dysmorphic disorder: Hospitalization for abdominal pain June 2020: CT of the abdomen did not reveal any cause.    Breast cancer surveillance: 1.  Breast exam 12/08/2021: Benign left breast.  Right mastectomy. 2. 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

## 2023-01-18 NOTE — Research (Signed)
OPTIMIZING PSYCHOSOCIAL INTERVENTION FOR BREAST CANCER-RELATED SEXUAL MORBIDITY: THE SEXUAL HEALTH AND INTIMACY EDUCATION (SHINE) TRIAL   Patient Tracey Evans was identified by Dr. Pamelia Hoit as a potential candidate for the above listed study.  This Clinical Research Nurse met with Tracey Evans, [id number], on 01/18/23 in a manner and location that ensures patient privacy to discuss participation in the above listed research study.  Patient is Unaccompanied.  A copy of the informed consent document and separate HIPAA Authorization was provided to the patient.  Patient reads, speaks, and understands [place].   Patient was provided with the business card of this Nurse and encouraged to contact the research team with any questions.  Approximately 15 minutes was spent with the patient reviewing the informed consent documents.  Patient was provided the option of taking informed consent documents home to review and was encouraged to review at their convenience with their support network, including other care providers. Patient took the consent documents home to review.  The pt was told that her participation is optional.  The nurse reviewed the [facility] brochure with the patient.  The nurse read that participants must have a romantic partner.  The pt said that she is divorced and is currently single.  The nurse informed the pt that she would verify if having a partner is an eligibility criteria for study enrollment.  The pt said that she would read the consent over the weekend.  The nurse will call the pt on Monday to discuss her eligibility status.  The pt was thanked for her interest in the study. Janan Ridge RN, BSN, CCRP Clinical Research Nurse Lead 01/18/2023 12:06 PM

## 2023-01-19 LAB — URINE CULTURE

## 2023-01-21 ENCOUNTER — Ambulatory Visit: Payer: Medicare Other | Admitting: Family Medicine

## 2023-01-21 ENCOUNTER — Encounter: Payer: Self-pay | Admitting: *Deleted

## 2023-01-21 DIAGNOSIS — C50511 Malignant neoplasm of lower-outer quadrant of right female breast: Secondary | ICD-10-CM

## 2023-01-21 NOTE — Research (Signed)
OPTIMIZING PSYCHOSOCIAL INTERVENTION FOR BREAST CANCER-RELATED SEXUAL MORBIDITY: THE SEXUAL HEALTH AND INTIMACY EDUCATION (SHINE) TRIAL   The nurse called to inform the patient that she is not eligible for study enrollment in the above study b/c she is not currently in an intimate relationship.  This is an eligibility criteria for the study.  The pt verbalized understanding.  The pt confirmed that she was divorced and currently single. The pt was thanked for her interest in the study.    The pt was then asked if she would like to enroll in the one-time data collection study below. The pt was not interested in sharing her demographic information with the study.  DCP-001: Use of a Clinical Trial Screening Tool to Address Cancer Health Disparities in the [facility] Research Program ([facility])   Janan Ridge RN, BSN, CCRP Clinical Research Nurse Lead 01/21/2023 12:06 PM

## 2023-02-01 ENCOUNTER — Encounter: Payer: Self-pay | Admitting: Family Medicine

## 2023-02-01 ENCOUNTER — Ambulatory Visit (INDEPENDENT_AMBULATORY_CARE_PROVIDER_SITE_OTHER): Payer: Medicare Other | Admitting: Family Medicine

## 2023-02-01 VITALS — BP 102/64 | HR 68 | Temp 97.8°F | Ht 64.0 in | Wt 177.0 lb

## 2023-02-01 DIAGNOSIS — I959 Hypotension, unspecified: Secondary | ICD-10-CM

## 2023-02-01 NOTE — Patient Instructions (Signed)
Follow up in 1 month to recheck blood pressure STOP the Amlodipine INCREASE your fluid intake When you get up from a seated positions, give yourself time to adjust Try and avoid prolonged standing Call with any questions or concerns Stay Safe!  Stay Healthy! HAPPY BELATED BIRTHDAY!!

## 2023-02-01 NOTE — Assessment & Plan Note (Signed)
Deteriorated.  Pt reports she became dizzy and fell while leaving the cancer center on 10/11.  Says she will become dizzy after rising from a seated position and taking a few steps.  Reviewed ER notes, labs, images, EKG.  Today BP is low which I suspect is contributing to these episodes of dizziness and her recent fall.  Encouraged increased fluid intake.  Will hold Amlodipine at this time and monitor BP and for symptom improvement.  Pt expressed understanding and is in agreement w/ plan.

## 2023-02-01 NOTE — Progress Notes (Signed)
Subjective:    Patient ID: Tracey Evans, female    DOB: 1945-06-14, 77 y.o.   MRN: [id number]  HPI ER f/u- pt went to the ER on 10/11 after she became dizzy and fell.  'my legs were going faster than I could keep up with'.  Larey Seat, landing on her R side and hitting her face on the concrete.  No LOC.  No associated CP, SOB, weakness, numbness.  Prior to leaving the ER she felt that she had returned to baseline.  Chipped 2 teeth.  Pt reports she will have moments of dizziness.  Not occurring daily.  Happens after rising from a seated position and taking a few steps.  Things start to feel unsteady, she pauses, and things improve.     Review of Systems For ROS see HPI     Objective:   Physical Exam Vitals reviewed.  Constitutional:      General: She is not in acute distress.    Appearance: Normal appearance. She is well-developed. She is not ill-appearing.  HENT:     Head: Normocephalic and atraumatic.  Eyes:     Conjunctiva/sclera: Conjunctivae normal.     Pupils: Pupils are equal, round, and reactive to light.  Neck:     Thyroid: No thyromegaly.  Cardiovascular:     Rate and Rhythm: Normal rate and regular rhythm.     Pulses: Normal pulses.     Heart sounds: Normal heart sounds. No murmur heard. Pulmonary:     Effort: Pulmonary effort is normal. No respiratory distress.     Breath sounds: Normal breath sounds.  Abdominal:     General: There is no distension.     Palpations: Abdomen is soft.     Tenderness: There is no abdominal tenderness.  Musculoskeletal:     Cervical back: Normal range of motion and neck supple.     Right lower leg: No edema.     Left lower leg: No edema.  Lymphadenopathy:     Cervical: No cervical adenopathy.  Skin:    General: Skin is warm and dry.  Neurological:     General: No focal deficit present.     Mental Status: She is alert and oriented to person, place, and time.     Gait: Gait normal.  Psychiatric:        Mood and Affect: Mood  normal.        Behavior: Behavior normal.        Thought Content: Thought content normal.           Assessment & Plan:

## 2023-03-01 DIAGNOSIS — M170 Bilateral primary osteoarthritis of knee: Secondary | ICD-10-CM | POA: Diagnosis not present

## 2023-03-02 ENCOUNTER — Other Ambulatory Visit: Payer: Self-pay | Admitting: Family Medicine

## 2023-03-02 DIAGNOSIS — E039 Hypothyroidism, unspecified: Secondary | ICD-10-CM

## 2023-03-09 ENCOUNTER — Inpatient Hospital Stay (HOSPITAL_COMMUNITY)
Admission: EM | Admit: 2023-03-09 | Discharge: 2023-03-11 | DRG: 378 | Disposition: A | Discharge status: Home / Self Care | Payer: Medicare Other | Attending: Family Medicine | Admitting: Family Medicine

## 2023-03-09 ENCOUNTER — Other Ambulatory Visit: Payer: Self-pay

## 2023-03-09 ENCOUNTER — Encounter (HOSPITAL_COMMUNITY): Payer: Self-pay | Admitting: Family Medicine

## 2023-03-09 DIAGNOSIS — I959 Hypotension, unspecified: Secondary | ICD-10-CM | POA: Diagnosis present

## 2023-03-09 DIAGNOSIS — K625 Hemorrhage of anus and rectum: Secondary | ICD-10-CM | POA: Diagnosis not present

## 2023-03-09 DIAGNOSIS — Z79890 Hormone replacement therapy (postmenopausal): Secondary | ICD-10-CM

## 2023-03-09 DIAGNOSIS — E1169 Type 2 diabetes mellitus with other specified complication: Secondary | ICD-10-CM | POA: Diagnosis not present

## 2023-03-09 DIAGNOSIS — K921 Melena: Secondary | ICD-10-CM | POA: Diagnosis not present

## 2023-03-09 DIAGNOSIS — Z791 Long term (current) use of non-steroidal anti-inflammatories (NSAID): Secondary | ICD-10-CM | POA: Diagnosis not present

## 2023-03-09 DIAGNOSIS — Z885 Allergy status to narcotic agent status: Secondary | ICD-10-CM

## 2023-03-09 DIAGNOSIS — E1140 Type 2 diabetes mellitus with diabetic neuropathy, unspecified: Secondary | ICD-10-CM | POA: Diagnosis present

## 2023-03-09 DIAGNOSIS — F32A Depression, unspecified: Secondary | ICD-10-CM | POA: Diagnosis present

## 2023-03-09 DIAGNOSIS — M797 Fibromyalgia: Secondary | ICD-10-CM | POA: Diagnosis not present

## 2023-03-09 DIAGNOSIS — E119 Type 2 diabetes mellitus without complications: Secondary | ICD-10-CM | POA: Diagnosis not present

## 2023-03-09 DIAGNOSIS — Z853 Personal history of malignant neoplasm of breast: Secondary | ICD-10-CM

## 2023-03-09 DIAGNOSIS — K922 Gastrointestinal hemorrhage, unspecified: Secondary | ICD-10-CM | POA: Diagnosis not present

## 2023-03-09 DIAGNOSIS — E785 Hyperlipidemia, unspecified: Secondary | ICD-10-CM | POA: Diagnosis present

## 2023-03-09 DIAGNOSIS — I10 Essential (primary) hypertension: Secondary | ICD-10-CM | POA: Diagnosis not present

## 2023-03-09 DIAGNOSIS — Z79899 Other long term (current) drug therapy: Secondary | ICD-10-CM

## 2023-03-09 DIAGNOSIS — Z91041 Radiographic dye allergy status: Secondary | ICD-10-CM | POA: Diagnosis not present

## 2023-03-09 DIAGNOSIS — J4489 Other specified chronic obstructive pulmonary disease: Secondary | ICD-10-CM | POA: Diagnosis present

## 2023-03-09 DIAGNOSIS — Z91048 Other nonmedicinal substance allergy status: Secondary | ICD-10-CM | POA: Diagnosis not present

## 2023-03-09 DIAGNOSIS — D62 Acute posthemorrhagic anemia: Secondary | ICD-10-CM | POA: Diagnosis not present

## 2023-03-09 DIAGNOSIS — E039 Hypothyroidism, unspecified: Secondary | ICD-10-CM | POA: Diagnosis not present

## 2023-03-09 DIAGNOSIS — E739 Lactose intolerance, unspecified: Secondary | ICD-10-CM | POA: Diagnosis present

## 2023-03-09 DIAGNOSIS — Z91040 Latex allergy status: Secondary | ICD-10-CM

## 2023-03-09 DIAGNOSIS — Z7982 Long term (current) use of aspirin: Secondary | ICD-10-CM | POA: Diagnosis not present

## 2023-03-09 DIAGNOSIS — C50511 Malignant neoplasm of lower-outer quadrant of right female breast: Secondary | ICD-10-CM | POA: Diagnosis not present

## 2023-03-09 DIAGNOSIS — F419 Anxiety disorder, unspecified: Secondary | ICD-10-CM | POA: Diagnosis not present

## 2023-03-09 DIAGNOSIS — K219 Gastro-esophageal reflux disease without esophagitis: Secondary | ICD-10-CM | POA: Diagnosis present

## 2023-03-09 DIAGNOSIS — K5731 Diverticulosis of large intestine without perforation or abscess with bleeding: Secondary | ICD-10-CM | POA: Diagnosis present

## 2023-03-09 DIAGNOSIS — Z170 Estrogen receptor positive status [ER+]: Secondary | ICD-10-CM | POA: Diagnosis not present

## 2023-03-09 DIAGNOSIS — R001 Bradycardia, unspecified: Secondary | ICD-10-CM | POA: Diagnosis present

## 2023-03-09 DIAGNOSIS — R55 Syncope and collapse: Secondary | ICD-10-CM | POA: Diagnosis present

## 2023-03-09 LAB — CBC WITH DIFFERENTIAL/PLATELET
Abs Immature Granulocytes: 0.05 10*3/uL (ref 0.00–0.07)
Basophils Absolute: 0.1 10*3/uL (ref 0.0–0.1)
Basophils Relative: 1 %
Eosinophils Absolute: 0.2 10*3/uL (ref 0.0–0.5)
Eosinophils Relative: 2 %
HCT: 38.8 % (ref 36.0–46.0)
Hemoglobin: 12.9 g/dL (ref 12.0–15.0)
Immature Granulocytes: 0 %
Lymphocytes Relative: 25 %
Lymphs Abs: 2.9 10*3/uL (ref 0.7–4.0)
MCH: 32 pg (ref 26.0–34.0)
MCHC: 33.2 g/dL (ref 30.0–36.0)
MCV: 96.3 fL (ref 80.0–100.0)
Monocytes Absolute: 0.9 10*3/uL (ref 0.1–1.0)
Monocytes Relative: 8 %
Neutro Abs: 7.4 10*3/uL (ref 1.7–7.7)
Neutrophils Relative %: 64 %
Platelets: 300 10*3/uL (ref 150–400)
RBC: 4.03 MIL/uL (ref 3.87–5.11)
RDW: 14.2 % (ref 11.5–15.5)
WBC: 11.5 10*3/uL — ABNORMAL HIGH (ref 4.0–10.5)
nRBC: 0 % (ref 0.0–0.2)

## 2023-03-09 LAB — BASIC METABOLIC PANEL
Anion gap: 7 (ref 5–15)
BUN: 21 mg/dL (ref 8–23)
CO2: 27 mmol/L (ref 22–32)
Calcium: 8.9 mg/dL (ref 8.9–10.3)
Chloride: 108 mmol/L (ref 98–111)
Creatinine, Ser: 1.22 mg/dL — ABNORMAL HIGH (ref 0.44–1.00)
GFR, Estimated: 46 mL/min — ABNORMAL LOW (ref >60)
Glucose, Bld: 129 mg/dL — ABNORMAL HIGH (ref 70–99)
Potassium: 4.3 mmol/L (ref 3.5–5.1)
Sodium: 142 mmol/L (ref 135–145)

## 2023-03-09 LAB — HEMOGLOBIN: Hemoglobin: 11.2 g/dL — ABNORMAL LOW (ref 12.0–15.0)

## 2023-03-09 LAB — HEMATOCRIT: HCT: 33.9 % — ABNORMAL LOW (ref 36.0–46.0)

## 2023-03-09 MED ORDER — GABAPENTIN 300 MG PO CAPS
300.0000 mg | ORAL_CAPSULE | Freq: Two times a day (BID) | ORAL | Status: DC
  Administered 2023-03-10 – 2023-03-11 (×3): 300 mg via ORAL
  Filled 2023-03-09 (×3): qty 1

## 2023-03-09 MED ORDER — ONDANSETRON HCL 4 MG/2ML IJ SOLN: 4.0000 mg | Freq: Four times a day (QID) | INTRAMUSCULAR | Status: DC | PRN

## 2023-03-09 MED ORDER — ONDANSETRON HCL 4 MG PO TABS: 4.0000 mg | ORAL_TABLET | Freq: Four times a day (QID) | ORAL | Status: DC | PRN

## 2023-03-09 MED ORDER — GABAPENTIN 300 MG PO CAPS
600.0000 mg | ORAL_CAPSULE | Freq: Every day | ORAL | Status: DC
  Administered 2023-03-09 – 2023-03-10 (×2): 600 mg via ORAL
  Filled 2023-03-09 (×2): qty 2

## 2023-03-09 MED ORDER — BUPROPION HCL ER (XL) 300 MG PO TB24
300.0000 mg | ORAL_TABLET | Freq: Every day | ORAL | Status: DC
  Administered 2023-03-10 – 2023-03-11 (×2): 300 mg via ORAL
  Filled 2023-03-09: qty 2
  Filled 2023-03-09: qty 1

## 2023-03-09 MED ORDER — SODIUM CHLORIDE 0.45 % IV SOLN: INTRAVENOUS | Status: DC

## 2023-03-09 MED ORDER — ATORVASTATIN CALCIUM 10 MG PO TABS
20.0000 mg | ORAL_TABLET | Freq: Every day | ORAL | Status: DC
  Administered 2023-03-10 – 2023-03-11 (×2): 20 mg via ORAL
  Filled 2023-03-09 (×2): qty 2

## 2023-03-09 MED ORDER — SODIUM CHLORIDE 0.9% FLUSH
3.0000 mL | Freq: Two times a day (BID) | INTRAVENOUS | Status: DC
  Administered 2023-03-09 – 2023-03-10 (×3): 3 mL via INTRAVENOUS

## 2023-03-09 MED ORDER — ACETAMINOPHEN 650 MG RE SUPP: 650.0000 mg | Freq: Four times a day (QID) | RECTAL | Status: DC | PRN

## 2023-03-09 MED ORDER — ACETAMINOPHEN 325 MG PO TABS: 650.0000 mg | ORAL_TABLET | Freq: Four times a day (QID) | ORAL | Status: DC | PRN

## 2023-03-09 MED ORDER — LEVOTHYROXINE SODIUM 75 MCG PO TABS
75.0000 ug | ORAL_TABLET | Freq: Every day | ORAL | Status: DC
  Administered 2023-03-10 – 2023-03-11 (×2): 75 ug via ORAL
  Filled 2023-03-09 (×3): qty 1

## 2023-03-09 NOTE — ED Triage Notes (Signed)
Pt presenting to ED via POV c/o passing quarter-sized blood clots in stool, reports she passed multiple clots back to back starting approx 30 min ago. Pt denies recent constipation, denies blood thinners, reports hx of GI bleed.  Pt has required multiple blood transfusions in the past (6units). Had bloody bm in triage.

## 2023-03-09 NOTE — ED Provider Notes (Signed)
[place] EMERGENCY DEPARTMENT AT [facility] Provider Note   CSN: [id number] Arrival date & time: 03/09/23  2108     History  Chief Complaint  Patient presents with   Rectal Bleeding    Tracey Evans is a 77 y.o. female.  77 yo F with a chief complaints of bright red blood per rectum.  The patient states that about 30 minutes prior to arrival she had multiple bowel movements and passed multiple clots.  Another episode when she got here in the waiting room.  Had had an issue like this in 2022 and was admitted to in [place] and had 6 units of blood transfused.  She denies any pain denies fevers denies vomiting.   Rectal Bleeding      Home Medications Prior to Admission medications   Medication Sig Start Date End Date Taking? Authorizing Provider  amLODipine (NORVASC) 5 MG tablet Take 1 tablet (5 mg total) by mouth daily. 10/19/22   Sheliah Hatch, MD  aspirin 81 MG tablet Take 81 mg by mouth daily.    [provider]  atorvastatin (LIPITOR) 20 MG tablet Take 1 tablet (20 mg total) by mouth daily. 10/19/22   Sheliah Hatch, MD  benzonatate (TESSALON PERLES) 100 MG capsule Take 1 capsule (100 mg total) by mouth 3 (three) times daily as needed for cough. Patient not taking: Reported on 02/01/2023 12/24/22   Sheliah Hatch, MD  buPROPion (WELLBUTRIN XL) 300 MG 24 hr tablet Take 1 tablet (300 mg total) by mouth daily. 10/19/22   Sheliah Hatch, MD  cephALEXin (KEFLEX) 500 MG capsule Take 1 capsule (500 mg total) by mouth 4 (four) times daily. Patient not taking: Reported on 02/01/2023 01/18/23   Al Decant, PA-C  gabapentin (NEURONTIN) 300 MG capsule TAKE 1 CAPSULE IN THE MORNING , 1 IN AFTERNOON AND 2 CAPSULES BEFORE BED Patient taking differently: Take 300-600 mg by mouth 3 (three) times daily. TAKE 1 CAPSULE IN THE MORNING , 1 IN AFTERNOON AND 2 CAPSULES BEFORE BED 09/19/22   Sheliah Hatch, MD  Glycerin-Polysorbate 80  (REFRESH DRY EYE THERAPY OP) Place 1 drop into both eyes 3 (three) times daily.    [provider]  levothyroxine (SYNTHROID) 75 MCG tablet TAKE 1 TABLET BY MOUTH EVERY DAY BEFORE BREAKFAST 03/04/23   Sheliah Hatch, MD  magic mouthwash SOLN Take 5 mLs by mouth 3 (three) times daily. Suspension contains equal amounts of Maalox Extra Strength, nystatin, and diphenhydramine. Patient not taking: Reported on 02/01/2023 01/18/23   Al Decant, PA-C  Magnesium 400 MG CAPS Take by mouth 2 (two) times daily.    [provider]  meloxicam (MOBIC) 15 MG tablet TAKE 1 TABLET (15 MG TOTAL) BY MOUTH DAILY. 11/14/22   Sheliah Hatch, MD  nystatin (MYCOSTATIN) 100000 UNIT/ML suspension Take by mouth. 01/20/23   [provider]  omeprazole (PRILOSEC) 20 MG capsule TAKE 1 CAPSULE BY MOUTH EVERY DAY Patient taking differently: Take 20 mg by mouth daily. 09/10/22   Sheliah Hatch, MD  polyethylene glycol powder (MIRALAX) 17 GM/SCOOP powder Take 17 g by mouth 2 (two) times daily as needed for mild constipation. 10/03/22   Almon Hercules, MD  traZODone (DESYREL) 50 MG tablet Take 50 mg by mouth at bedtime. 03/25/22   [provider]      Allergies    Contrast media [iodinated contrast media], Dilaudid [hydromorphone hcl], Adhesive [tape], Codeine, Cymbalta [duloxetine hcl], Lactose intolerance (gi),  Latex, Nsaids, and Lyrica [pregabalin]    Review of Systems   Review of Systems  Gastrointestinal:  Positive for hematochezia.    Physical Exam Updated Vital Signs BP (!) 128/93   Pulse 90   Temp 98.7 F (37.1 C) (Oral)   Resp 18   Ht 5\' 4"  (1.626 m)   Wt 77.1 kg   SpO2 95%   BMI 29.18 kg/m  Physical Exam Vitals and nursing note reviewed.  Constitutional:      General: She is not in acute distress.    Appearance: She is well-developed. She is not diaphoretic.  HENT:     Head: Normocephalic and atraumatic.  Eyes:     Pupils: Pupils are equal, round,  and reactive to light.  Cardiovascular:     Rate and Rhythm: Normal rate and regular rhythm.     Heart sounds: No murmur heard.    No friction rub. No gallop.  Pulmonary:     Effort: Pulmonary effort is normal.     Breath sounds: No wheezing or rales.  Abdominal:     General: There is no distension.     Palpations: Abdomen is soft.     Tenderness: There is no abdominal tenderness.  Musculoskeletal:        General: No tenderness.     Cervical back: Normal range of motion and neck supple.  Skin:    General: Skin is warm and dry.  Neurological:     Mental Status: She is alert and oriented to person, place, and time.  Psychiatric:        Behavior: Behavior normal.     ED Results / Procedures / Treatments   Labs (all labs ordered are listed, but only abnormal results are displayed) Labs Reviewed  CBC WITH DIFFERENTIAL/PLATELET - Abnormal; Notable for the following components:      Result Value   WBC 11.5 (*)    All other components within normal limits  BASIC METABOLIC PANEL - Abnormal; Notable for the following components:   Glucose, Bld 129 (*)    Creatinine, Ser 1.22 (*)    GFR, Estimated 46 (*)    All other components within normal limits  TYPE AND SCREEN    EKG None  Radiology No results found.  Procedures Procedures    Medications Ordered in ED Medications - No data to display  ED Course/ Medical Decision Making/ A&P                                 Medical Decision Making Amount and/or Complexity of Data Reviewed Labs: ordered.  Risk Decision regarding hospitalization.   77 yo F with a chief complaints of bright red blood per rectum.  This been going on for about 30 minutes prior to arrival.  Has had multiple bowel movements including 1 on arrival here.  Will obtain blood counts here.  Likely observe overnight.  Not on blood thinners.  Tachycardic.  Tachycardia has resolved.  No acute anemia.  No metabolic abnormalities.    Discussed with Dr.  Leonides Schanz [facility] to see in the morning.   Discussed with medicine for admission.  The patients results and plan were reviewed and discussed.   Any x-rays performed were independently reviewed by myself.   Differential diagnosis were considered with the presenting HPI.  Medications - No data to display  Vitals:   03/09/23 2113 03/09/23 2124 03/09/23 2200 03/09/23 2230  BP: Marland Kitchen)  137/93  (!) 143/97 (!) 128/93  Pulse: (!) 103  96 90  Resp: 18     Temp: 98.7 F (37.1 C)     TempSrc: Oral     SpO2: 98%  96% 95%  Weight:  77.1 kg    Height:  5\' 4"  (1.626 m)      Final diagnoses:  BRBPR (bright red blood per rectum)    Admission/ observation were discussed with the admitting physician, patient and/or family and they are comfortable with the plan.          Final Clinical Impression(s) / ED Diagnoses Final diagnoses:  BRBPR (bright red blood per rectum)    Rx / DC Orders ED Discharge Orders     None         Melene Plan, DO 03/09/23 2249

## 2023-03-09 NOTE — H&P (Addendum)
History and Physical    HARRIE SCHWANTZ ZOX:[id number] DOB: 1946/01/25 DOA: 03/09/2023  PCP: Sheliah Hatch, MD   Patient coming from: Home   Chief Complaint: Rectal bleeding   HPI: Tracey Evans is a 77 y.o. female with medical history significant for hypertension, hyperlipidemia, hypothyroidism, depression, anxiety, diet-controlled diabetes mellitus, and history of breast cancer currently under surveillance who presents with rectal bleeding.  Patient was in her usual state of health and had just finished a meal when she felt the need to move her bowels.  She passed blood clots and had bright red blood on the toilet paper.  She went on to have a bloody bowel movement in the ED.  She has not had any abdominal pain, nausea, or vomiting associated with this.  Colonoscopy 1 year ago notable for diverticulosis throughout the entire examined colon.   ED Course: Upon arrival to the ED, patient is found to be afebrile and saturating well on room air with mild tachycardia and stable blood pressure.  Labs are most notable for normal BUN, creatinine 1.22, and normal hemoglobin.  Gastroenterology (Dr. Leonides Schanz) was consulted by the ED physician and hospitalists were asked to admit.  Review of Systems:  All other systems reviewed and apart from HPI, are negative.  Past Medical History:  Diagnosis Date   Angina at rest [facility])    "occurs whenever it wants to, but worse during agitation"   Anxiety    Asthma    Binge eating disorder    Breast cancer of lower-outer quadrant of left female breast (HCC) 07/08/2015   "NEVER HAD LEFT BREAST CANCER" (08/15/2015)   Cancer of right breast (HCC) 08/15/2015   Chronic bronchitis (HCC)    Chronic lower back pain    Complication of anesthesia    had bronc spasms during intubation surgery 2009 on foot-need albuterol inhaler or neb tx preop   Costochondritis    Depression    Diverticular disease    11-2020 bleed   Fibromyalgia    GERD  (gastroesophageal reflux disease)    Hot flashes    Hyperlipidemia    Hypertension    Hypothyroidism    Osteoarthritis    "all over"   Peptic ulcer disease    Personal history of chemotherapy 2017   Shortness of breath dyspnea    Type II diabetes mellitus (HCC)    "diet controlled" (08/15/2015)    Past Surgical History:  Procedure Laterality Date   ABDOMINAL HYSTERECTOMY     "left my ovaries"   ANKLE ARTHROSCOPY  01/02/2012   Procedure: ANKLE ARTHROSCOPY;  Surgeon: Sherri Rad, MD;  Location: [facility];  Service: Orthopedics;  Laterality: Right;  right ankle arthroscopy with extensive debridement , dridement and drilling talar dome osteochondral lesion   ANKLE FRACTURE SURGERY Right 2009   Dr. Shon Baton   BREAST BIOPSY  07/2015   CARDIAC CATHETERIZATION     CARPAL TUNNEL RELEASE Bilateral    CATARACT EXTRACTION, BILATERAL     COLONOSCOPY     DILATION AND CURETTAGE OF UTERUS     "before hysterectomy"   FRACTURE SURGERY     KNEE ARTHROSCOPY Bilateral    LAPAROSCOPIC CHOLECYSTECTOMY     MASTECTOMY COMPLETE / SIMPLE W/ SENTINEL NODE BIOPSY Right 08/15/2015   MASTECTOMY W/ SENTINEL NODE BIOPSY Right 08/15/2015   Procedure: TOTAL MASTECTOMY WITH SENTINEL LYMPH NODE BIOPSY AND BLUE DYE INJECTION ;  Surgeon: Claud Kelp, MD;  Location: [facility] OR;  Service: General;  Laterality: Right;  PORT-A-CATH REMOVAL N/A 11/12/2016   Procedure: REMOVAL PORT-A-CATH;  Surgeon: Avel Peace, MD;  Location: [facility];  Service: General;  Laterality: N/A;   PORTACATH PLACEMENT Left 08/15/2015   PORTACATH PLACEMENT Left 08/15/2015   Procedure: INSERTION PORT-A-CATH ;  Surgeon: Claud Kelp, MD;  Location: [facility] OR;  Service: General;  Laterality: Left;   SHOULDER ARTHROSCOPY Bilateral 2009-2011   "bone spurs"   TONSILLECTOMY      Social History:   reports that she has never smoked. She has never used smokeless tobacco. She reports that she does not currently use alcohol. She  reports that she does not use drugs.  Allergies  Allergen Reactions   Contrast Media [Iodinated Contrast Media] Shortness Of Breath    Patient has SOB, tightness in chest and feels like an elephant is sitting on chest, patient should be  scanned at hospital Per Dr. Gery Pray   Dilaudid [Hydromorphone Hcl] Anaphylaxis    Pt stopped breathing   Adhesive [Tape]     blister   Codeine Nausea Only    insomnia   Cymbalta [Duloxetine Hcl] Other (See Comments)    Hallucinations.    Lactose Intolerance (Gi) Diarrhea   Latex     REACTION: burns skin- Latex tape only    Nsaids    Lyrica [Pregabalin] Other (See Comments)    Mood swings.     Family History  Problem Relation Age of Onset   Colon polyps Mother    Dementia Mother    Stroke Mother    Heart Problems Mother    Dementia Father    Alcohol abuse Father    Colon cancer Father        dx. 74 or younger-pt states never confirmed   Breast cancer Sister 13       inflammatory   Diverticulitis Sister        maternal half-sister; severe - causing partial colectomy   Breast cancer Sister        maternal half-sister; dx. early 79s   Alcohol abuse Brother    Colon polyps Brother    Breast cancer Other 24       niece   Breast cancer Other        niece dx. 50s   Esophageal cancer Neg Hx    Stomach cancer Neg Hx    Rectal cancer Neg Hx      Prior to Admission medications   Medication Sig Start Date End Date Taking? Authorizing Provider  amLODipine (NORVASC) 5 MG tablet Take 1 tablet (5 mg total) by mouth daily. 10/19/22   Sheliah Hatch, MD  aspirin 81 MG tablet Take 81 mg by mouth daily.    [provider]  atorvastatin (LIPITOR) 20 MG tablet Take 1 tablet (20 mg total) by mouth daily. 10/19/22   Sheliah Hatch, MD  buPROPion (WELLBUTRIN XL) 300 MG 24 hr tablet Take 1 tablet (300 mg total) by mouth daily. 10/19/22   Sheliah Hatch, MD  gabapentin (NEURONTIN) 300 MG capsule TAKE 1 CAPSULE IN THE MORNING , 1 IN  AFTERNOON AND 2 CAPSULES BEFORE BED Patient taking differently: Take 300-600 mg by mouth 3 (three) times daily. TAKE 1 CAPSULE IN THE MORNING , 1 IN AFTERNOON AND 2 CAPSULES BEFORE BED 09/19/22   Sheliah Hatch, MD  Glycerin-Polysorbate 80 (REFRESH DRY EYE THERAPY OP) Place 1 drop into both eyes 3 (three) times daily.    [provider]  levothyroxine (SYNTHROID) 75 MCG tablet TAKE 1 TABLET  BY MOUTH EVERY DAY BEFORE BREAKFAST 03/04/23   Sheliah Hatch, MD  magic mouthwash SOLN Take 5 mLs by mouth 3 (three) times daily. Suspension contains equal amounts of Maalox Extra Strength, nystatin, and diphenhydramine. Patient not taking: Reported on 02/01/2023 01/18/23   Al Decant, PA-C  Magnesium 400 MG CAPS Take by mouth 2 (two) times daily.    [provider]  meloxicam (MOBIC) 15 MG tablet TAKE 1 TABLET (15 MG TOTAL) BY MOUTH DAILY. 11/14/22   Sheliah Hatch, MD  nystatin (MYCOSTATIN) 100000 UNIT/ML suspension Take by mouth. 01/20/23   [provider]  omeprazole (PRILOSEC) 20 MG capsule TAKE 1 CAPSULE BY MOUTH EVERY DAY Patient taking differently: Take 20 mg by mouth daily. 09/10/22   Sheliah Hatch, MD  polyethylene glycol powder (MIRALAX) 17 GM/SCOOP powder Take 17 g by mouth 2 (two) times daily as needed for mild constipation. 10/03/22   Almon Hercules, MD  traZODone (DESYREL) 50 MG tablet Take 50 mg by mouth at bedtime. 03/25/22   [provider]    Physical Exam: Vitals:   03/09/23 2113 03/09/23 2124 03/09/23 2200 03/09/23 2230  BP: (!) 137/93  (!) 143/97 (!) 128/93  Pulse: (!) 103  96 90  Resp: 18     Temp: 98.7 F (37.1 C)     TempSrc: Oral     SpO2: 98%  96% 95%  Weight:  77.1 kg    Height:  5\' 4"  (1.626 m)      Constitutional: NAD, calm  Eyes: PERTLA, lids and conjunctivae normal ENMT: Mucous membranes are moist. Posterior pharynx clear of any exudate or lesions.   Neck: supple, no masses  Respiratory: no wheezing, no  crackles. No accessory muscle use.  Cardiovascular: S1 & S2 heard, regular rate and rhythm. Trace b/l lower extremity edema.   Abdomen: No distension, no tenderness, soft. Bowel sounds active.  Musculoskeletal: no clubbing / cyanosis. No joint deformity upper and lower extremities.   Skin: no significant rashes, lesions, ulcers. Warm, dry, well-perfused. Neurologic: CN 2-12 grossly intact. Moving all extremities. Alert and oriented.  Psychiatric: Pleasant. Cooperative.    Labs and Imaging on Admission: I have personally reviewed following labs and imaging studies  CBC: Recent Labs  Lab 03/09/23 2134  WBC 11.5*  NEUTROABS 7.4  HGB 12.9  HCT 38.8  MCV 96.3  PLT 300   Basic Metabolic Panel: Recent Labs  Lab 03/09/23 2134  NA 142  K 4.3  CL 108  CO2 27  GLUCOSE 129*  BUN 21  CREATININE 1.22*  CALCIUM 8.9   GFR: Estimated Creatinine Clearance: 38.8 mL/min (A) (by C-G formula based on SCr of 1.22 mg/dL (H)). Liver Function Tests: No results for input(s): "AST", "ALT", "ALKPHOS", "BILITOT", "PROT", "ALBUMIN" in the last 168 hours. No results for input(s): "LIPASE", "AMYLASE" in the last 168 hours. No results for input(s): "AMMONIA" in the last 168 hours. Coagulation Profile: No results for input(s): "INR", "PROTIME" in the last 168 hours. Cardiac Enzymes: No results for input(s): "CKTOTAL", "CKMB", "CKMBINDEX", "TROPONINI" in the last 168 hours. BNP (last 3 results) No results for input(s): "PROBNP" in the last 8760 hours. HbA1C: No results for input(s): "HGBA1C" in the last 72 hours. CBG: No results for input(s): "GLUCAP" in the last 168 hours. Lipid Profile: No results for input(s): "CHOL", "HDL", "LDLCALC", "TRIG", "CHOLHDL", "LDLDIRECT" in the last 72 hours. Thyroid Function Tests: No results for input(s): "TSH", "T4TOTAL", "FREET4", "T3FREE", "THYROIDAB" in the last 72 hours. Anemia  Panel: No results for input(s): "VITAMINB12", "FOLATE", "FERRITIN", "TIBC",  "IRON", "RETICCTPCT" in the last 72 hours. Urine analysis:    Component Value Date/Time   COLORURINE AMBER (A) 01/18/2023 1251   APPEARANCEUR CLOUDY (A) 01/18/2023 1251   LABSPEC 1.030 01/18/2023 1251   PHURINE 5.0 01/18/2023 1251   GLUCOSEU NEGATIVE 01/18/2023 1251   HGBUR SMALL (A) 01/18/2023 1251   BILIRUBINUR NEGATIVE 01/18/2023 1251   BILIRUBINUR 1+ 07/08/2017 1020   KETONESUR NEGATIVE 01/18/2023 1251   PROTEINUR 100 (A) 01/18/2023 1251   UROBILINOGEN 0.2 07/08/2017 1020   UROBILINOGEN 0.2 07/18/2014 1133   NITRITE NEGATIVE 01/18/2023 1251   LEUKOCYTESUR LARGE (A) 01/18/2023 1251   Sepsis Labs: @LABRCNTIP (procalcitonin:4,lacticidven:4) )No results found for this or any previous visit (from the past 240 hour(s)).   Radiological Exams on Admission: No results found.   Assessment/Plan   1. GI bleeding  - BUN is normal and no upper GI symptoms  - Most likely diverticular  - Hold ASA, continue bowel rest, trend H&H, transfuse if needed, follow-up on GI recommendations   2. Hypertension  - Treat as-needed only for now   3. Type II DM  - Diet-controlled at home  - Monitor, use low-intensity SSI if needed   4. Hyperlipidemia  - Lipitor   5. Hypothyroidism  - Synthroid     6. Depression  - Wellbutrin   7. Neuropathy  - Gabapentin    DVT prophylaxis: SCDs  Code Status: Full  Level of Care: Level of care: Telemetry Family Communication: Daughter at bedside  Disposition Plan:  Patient is from: home Anticipated d/c is to: home Anticipated d/c date is: 03/11/23  Patient currently: Pending stable H&H  Consults called: GI  Admission status: Inpatient     Briscoe Deutscher, MD [facility]  03/09/2023, 10:50 PM

## 2023-03-10 DIAGNOSIS — E119 Type 2 diabetes mellitus without complications: Secondary | ICD-10-CM | POA: Diagnosis not present

## 2023-03-10 DIAGNOSIS — D62 Acute posthemorrhagic anemia: Secondary | ICD-10-CM | POA: Diagnosis not present

## 2023-03-10 DIAGNOSIS — E785 Hyperlipidemia, unspecified: Secondary | ICD-10-CM

## 2023-03-10 DIAGNOSIS — C50511 Malignant neoplasm of lower-outer quadrant of right female breast: Secondary | ICD-10-CM

## 2023-03-10 DIAGNOSIS — E1169 Type 2 diabetes mellitus with other specified complication: Secondary | ICD-10-CM

## 2023-03-10 DIAGNOSIS — M797 Fibromyalgia: Secondary | ICD-10-CM

## 2023-03-10 DIAGNOSIS — Z170 Estrogen receptor positive status [ER+]: Secondary | ICD-10-CM

## 2023-03-10 DIAGNOSIS — E039 Hypothyroidism, unspecified: Secondary | ICD-10-CM

## 2023-03-10 DIAGNOSIS — K922 Gastrointestinal hemorrhage, unspecified: Secondary | ICD-10-CM | POA: Diagnosis not present

## 2023-03-10 LAB — BASIC METABOLIC PANEL
Anion gap: 3 — ABNORMAL LOW (ref 5–15)
BUN: 26 mg/dL — ABNORMAL HIGH (ref 8–23)
CO2: 23 mmol/L (ref 22–32)
Calcium: 7.5 mg/dL — ABNORMAL LOW (ref 8.9–10.3)
Chloride: 109 mmol/L (ref 98–111)
Creatinine, Ser: 0.98 mg/dL (ref 0.44–1.00)
GFR, Estimated: 59 mL/min — ABNORMAL LOW (ref >60)
Glucose, Bld: 116 mg/dL — ABNORMAL HIGH (ref 70–99)
Potassium: 3.6 mmol/L (ref 3.5–5.1)
Sodium: 135 mmol/L (ref 135–145)

## 2023-03-10 LAB — HEMOGLOBIN
Hemoglobin: 9.2 g/dL — ABNORMAL LOW (ref 12.0–15.0)
Hemoglobin: 9.3 g/dL — ABNORMAL LOW (ref 12.0–15.0)

## 2023-03-10 LAB — CBG MONITORING, ED
Glucose-Capillary: 100 mg/dL — ABNORMAL HIGH (ref 70–99)
Glucose-Capillary: 92 mg/dL (ref 70–99)

## 2023-03-10 LAB — PREPARE RBC (CROSSMATCH)

## 2023-03-10 LAB — HEMOGLOBIN AND HEMATOCRIT, BLOOD
HCT: 32.7 % — ABNORMAL LOW (ref 36.0–46.0)
Hemoglobin: 10.7 g/dL — ABNORMAL LOW (ref 12.0–15.0)

## 2023-03-10 LAB — HEMATOCRIT
HCT: 29 % — ABNORMAL LOW (ref 36.0–46.0)
HCT: 28.7 % — ABNORMAL LOW (ref 36.0–46.0)

## 2023-03-10 LAB — GLUCOSE, CAPILLARY
Glucose-Capillary: 72 mg/dL (ref 70–99)
Glucose-Capillary: 87 mg/dL (ref 70–99)
Glucose-Capillary: 81 mg/dL (ref 70–99)

## 2023-03-10 MED ORDER — SODIUM CHLORIDE 0.9% IV SOLUTION
Freq: Once | INTRAVENOUS | Status: DC
Start: 2023-03-10 — End: 2023-03-11

## 2023-03-10 MED ORDER — INSULIN ASPART 100 UNIT/ML IJ SOLN: 0.0000 [IU] | Freq: Three times a day (TID) | INTRAMUSCULAR | Status: DC

## 2023-03-10 MED ORDER — INSULIN ASPART 100 UNIT/ML IJ SOLN
0.0000 [IU] | INTRAMUSCULAR | Status: DC
  Filled 2023-03-10: qty 0.06

## 2023-03-10 MED ORDER — SODIUM CHLORIDE 0.9 % IV BOLUS
500.0000 mL | Freq: Once | INTRAVENOUS | Status: AC
  Administered 2023-03-10: 500 mL via INTRAVENOUS

## 2023-03-10 MED ORDER — INSULIN ASPART 100 UNIT/ML IJ SOLN: 0.0000 [IU] | Freq: Every day | INTRAMUSCULAR | Status: DC

## 2023-03-10 MED ORDER — SIMETHICONE 80 MG PO CHEW
80.0000 mg | CHEWABLE_TABLET | Freq: Four times a day (QID) | ORAL | Status: DC | PRN
  Administered 2023-03-10: 80 mg via ORAL
  Filled 2023-03-10 (×2): qty 1

## 2023-03-10 NOTE — ED Provider Notes (Signed)
Called to room for syncope.  Patient is now awake and oriented.  Patient had gotten up to commode and was nauseated.  [facility] who has admitted the patient was paged.  500 cc NSSS ordered.    AO3 RRR CTAb   Tharun Cappella, MD 03/10/23 [id number]

## 2023-03-10 NOTE — Hospital Course (Signed)
77 y.o. F with DM, HTN, HLD, diverticulosis and diverticulitis with contained perf in Jun 2024, hypothyroidism, anxiety, fibromyalgia and BrCA in remission who presented with painless bright red rectal bleeding.

## 2023-03-10 NOTE — Plan of Care (Signed)

## 2023-03-10 NOTE — Consult Note (Signed)
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  Depending upon clinical course, if she has brisk persistent bleeding you could do a CT angio of the abdomen and pelvis to try to localize the source for potential IR treatment.  She does have a contrast allergy making CT angio more difficult to perform.  Overall would continue with supportive care and watchful waiting as diverticular hemorrhage is usually self-limited and extremely difficult to find the source and treat using colonoscopy also.  Hold meloxicam  Check INR in the morning   Clear liquid diet is ordered  Iva Boop, MD, [facility] See Loretha Stapler on call - gastroenterology for best contact person 03/10/2023 12:14 PM          HPI:   Tracey Evans is a 77 y.o. female w/ hx type II DM, GERD, fibromyalgia, osteoarthritis and prior breast cancer presenting with acute onset of painless hematochezia several times since yesterday, and decreased hemoglobin.  She had traveled to be with family for Thanksgiving up in [place] and noted a couple of episodes of postprandial bloating on the right side treated with Phazyme yesterday and the day before, but otherwise had been doing well.  She did feel very hot and nauseous and had either syncope while on the bedside commode in the presence of the nurse earlier today.  She has  not had bleeding since and feels well at this time. She does not have any melena nausea vomiting or significant abdominal pain.  Recent Labs  Lab 03/09/23 2134 03/09/23 2319 03/10/23 0309 03/10/23 0449 03/10/23 1130  HGB 12.9 11.2* 10.7* 9.2* 9.3*     She takes meloxicam chronically for osteoarthritis problems.  In 2022 when she was in [place] she was hospitalized at [facility] August 6 through August 9, she had a similar presentation with hematochezia though abdominal pain was described and she had syncope as well.  A CTA at that time demonstrated diverticulosis and no source of bleeding, she was treated with tranexamic acid and received a total of 6 units of packed red cells FFP and platelets.  She was unresponsive when she was in the ICU having a massive bloody bowel movement though she maintained a pulse she became bradycardic.  A flex sig was performed on 11/13/2020 showing diverticulosis and blood but no other lesions.    Colonoscopy January 2023-Dr. Danis - Diverticulosis in the entire examined colon. - One 10 mm polyp in the distal ascending colon, removed with a hot snare. Resected and retrieved. - The examination was otherwise normal on direct and retroflexion views. I  Polyp pathology - inflammatory polyp No routine repeat indicated  Past Medical History:  Diagnosis Date   Angina at rest [facility])    "occurs whenever it wants to, but worse during agitation"   Anxiety    Asthma    Binge eating disorder    Breast cancer of lower-outer quadrant of left female breast (HCC) 07/08/2015   "NEVER HAD LEFT BREAST CANCER" (08/15/2015)  Cancer of right breast (HCC) 08/15/2015   Chronic bronchitis (HCC)    Chronic lower back pain    Complication of anesthesia    had bronc spasms during intubation surgery 2009 on foot-need albuterol inhaler or neb tx preop   Costochondritis    Depression    Diverticular disease    11-2020 bleed   Fibromyalgia    GERD (gastroesophageal  reflux disease)    Hot flashes    Hyperlipidemia    Hypertension    Hypothyroidism    Osteoarthritis    "all over"   Peptic ulcer disease    Personal history of chemotherapy 2017   Shortness of breath dyspnea    Type II diabetes mellitus (HCC)    "diet controlled" (08/15/2015)    Past Surgical History:  Procedure Laterality Date   ABDOMINAL HYSTERECTOMY     "left my ovaries"   ANKLE ARTHROSCOPY  01/02/2012   Procedure: ANKLE ARTHROSCOPY;  Surgeon: Sherri Rad, MD;  Location: [facility];  Service: Orthopedics;  Laterality: Right;  right ankle arthroscopy with extensive debridement , dridement and drilling talar dome osteochondral lesion   ANKLE FRACTURE SURGERY Right 2009   Dr. Shon Baton   BREAST BIOPSY  07/2015   CARDIAC CATHETERIZATION     CARPAL TUNNEL RELEASE Bilateral    CATARACT EXTRACTION, BILATERAL     COLONOSCOPY     DILATION AND CURETTAGE OF UTERUS     "before hysterectomy"   FRACTURE SURGERY     KNEE ARTHROSCOPY Bilateral    LAPAROSCOPIC CHOLECYSTECTOMY     MASTECTOMY COMPLETE / SIMPLE W/ SENTINEL NODE BIOPSY Right 08/15/2015   MASTECTOMY W/ SENTINEL NODE BIOPSY Right 08/15/2015   Procedure: TOTAL MASTECTOMY WITH SENTINEL LYMPH NODE BIOPSY AND BLUE DYE INJECTION ;  Surgeon: Claud Kelp, MD;  Location: [facility] OR;  Service: General;  Laterality: Right;   PORT-A-CATH REMOVAL N/A 11/12/2016   Procedure: REMOVAL PORT-A-CATH;  Surgeon: Avel Peace, MD;  Location: [facility] ;  Service: General;  Laterality: N/A;   PORTACATH PLACEMENT Left 08/15/2015   PORTACATH PLACEMENT Left 08/15/2015   Procedure: INSERTION PORT-A-CATH ;  Surgeon: Claud Kelp, MD;  Location: [facility] OR;  Service: General;  Laterality: Left;   SHOULDER ARTHROSCOPY Bilateral 2009-2011   "bone spurs"   TONSILLECTOMY      Family History  Problem Relation Age of Onset   Colon polyps Mother    Dementia Mother    Stroke Mother    Heart Problems Mother    Dementia Father    Alcohol  abuse Father    Colon cancer Father        dx. 74 or younger-pt states never confirmed   Breast cancer Sister 58       inflammatory   Diverticulitis Sister        maternal half-sister; severe - causing partial colectomy   Breast cancer Sister        maternal half-sister; dx. early 78s   Alcohol abuse Brother    Colon polyps Brother    Breast cancer Other 20       niece   Breast cancer Other        niece dx. 50s   Esophageal cancer Neg Hx    Stomach cancer Neg Hx    Rectal cancer Neg Hx     Social History   Tobacco Use   Smoking status: Never   Smokeless tobacco: Never  Vaping Use   Vaping status: Never Used  Substance Use Topics  Alcohol use: Not Currently    Alcohol/week: 0.0 standard drinks of alcohol    Comment: 08/15/2015 "1/2 glass of wine q 2 months"    Drug use: No    Prior to Admission medications   Medication Sig Start Date End Date Taking? Authorizing Provider  aspirin 81 MG tablet Take 81 mg by mouth daily.   Yes [provider]  atorvastatin (LIPITOR) 20 MG tablet Take 1 tablet (20 mg total) by mouth daily. 10/19/22  Yes Sheliah Hatch, MD  buPROPion (WELLBUTRIN XL) 300 MG 24 hr tablet Take 1 tablet (300 mg total) by mouth daily. 10/19/22  Yes Sheliah Hatch, MD  gabapentin (NEURONTIN) 300 MG capsule TAKE 1 CAPSULE IN THE MORNING , 1 IN AFTERNOON AND 2 CAPSULES BEFORE BED Patient taking differently: Take 300-600 mg by mouth 3 (three) times daily. TAKE 1 CAPSULE IN THE MORNING , 1 IN AFTERNOON AND 2 CAPSULES BEFORE BED 09/19/22  Yes Sheliah Hatch, MD  levothyroxine (SYNTHROID) 75 MCG tablet TAKE 1 TABLET BY MOUTH EVERY DAY BEFORE BREAKFAST 03/04/23  Yes Sheliah Hatch, MD  Magnesium 400 MG CAPS Take by mouth 2 (two) times daily.   Yes [provider]  meloxicam (MOBIC) 15 MG tablet TAKE 1 TABLET (15 MG TOTAL) BY MOUTH DAILY. 11/14/22  Yes Sheliah Hatch, MD  omeprazole (PRILOSEC) 20 MG capsule TAKE 1 CAPSULE BY MOUTH EVERY  DAY Patient taking differently: Take 20 mg by mouth daily. 09/10/22  Yes Sheliah Hatch, MD  polyethylene glycol powder (MIRALAX) 17 GM/SCOOP powder Take 17 g by mouth 2 (two) times daily as needed for mild constipation. 10/03/22  Yes Almon Hercules, MD  traZODone (DESYREL) 50 MG tablet Take 50 mg by mouth at bedtime. 03/25/22  Yes [provider]  amLODipine (NORVASC) 5 MG tablet Take 1 tablet (5 mg total) by mouth daily. Patient not taking: Reported on 03/10/2023 10/19/22   Sheliah Hatch, MD  Glycerin-Polysorbate 80 (REFRESH DRY EYE THERAPY OP) Place 1 drop into both eyes 3 (three) times daily. Patient not taking: Reported on 03/10/2023    [provider]  magic mouthwash SOLN Take 5 mLs by mouth 3 (three) times daily. Suspension contains equal amounts of Maalox Extra Strength, nystatin, and diphenhydramine. Patient not taking: Reported on 02/01/2023 01/18/23   Al Decant, PA-C  nystatin (MYCOSTATIN) 100000 UNIT/ML suspension Take by mouth. Patient not taking: Reported on 03/10/2023 01/20/23   [provider]    Current Facility-Administered Medications  Medication Dose Route Frequency Provider Last Rate Last Admin   0.45 % sodium chloride infusion   Intravenous Continuous Opyd, Lavone Neri, MD 65 mL/hr at 03/10/23 0903 Infusion Verify at 03/10/23 0903   0.9 %  sodium chloride infusion (Manually program via Guardrails IV Fluids)   Intravenous Once Anthoney Harada, NP       acetaminophen (TYLENOL) tablet 650 mg  650 mg Oral Q6H PRN Opyd, Lavone Neri, MD       Or   acetaminophen (TYLENOL) suppository 650 mg  650 mg Rectal Q6H PRN Opyd, Lavone Neri, MD       atorvastatin (LIPITOR) tablet 20 mg  20 mg Oral Daily Opyd, Lavone Neri, MD   20 mg at 03/10/23 0941   buPROPion (WELLBUTRIN XL) 24 hr tablet 300 mg  300 mg Oral Daily Opyd, Lavone Neri, MD   300 mg at 03/10/23 0939   gabapentin (NEURONTIN) capsule 300 mg  300 mg Oral BID AC Opyd, Lavone Neri,  MD   300 mg at  03/10/23 0939   gabapentin (NEURONTIN) capsule 600 mg  600 mg Oral QHS Opyd, Lavone Neri, MD   600 mg at 03/09/23 2337   insulin aspart (novoLOG) injection 0-6 Units  0-6 Units Subcutaneous Q4H Danford, Earl Lites, MD       levothyroxine (SYNTHROID) tablet 75 mcg  75 mcg Oral Q0600 Briscoe Deutscher, MD   75 mcg at 03/10/23 0548   ondansetron (ZOFRAN) tablet 4 mg  4 mg Oral Q6H PRN Opyd, Lavone Neri, MD       Or   ondansetron (ZOFRAN) injection 4 mg  4 mg Intravenous Q6H PRN Opyd, Lavone Neri, MD       simethicone (MYLICON) chewable tablet 80 mg  80 mg Oral Q6H PRN Opyd, Lavone Neri, MD   80 mg at 03/10/23 0130   sodium chloride flush (NS) 0.9 % injection 3 mL  3 mL Intravenous Q12H Opyd, Lavone Neri, MD   3 mL at 03/10/23 1052   Current Outpatient Medications  Medication Sig Dispense Refill   aspirin 81 MG tablet Take 81 mg by mouth daily.     atorvastatin (LIPITOR) 20 MG tablet Take 1 tablet (20 mg total) by mouth daily. 90 tablet 1   buPROPion (WELLBUTRIN XL) 300 MG 24 hr tablet Take 1 tablet (300 mg total) by mouth daily. 90 tablet 1   gabapentin (NEURONTIN) 300 MG capsule TAKE 1 CAPSULE IN THE MORNING , 1 IN AFTERNOON AND 2 CAPSULES BEFORE BED (Patient taking differently: Take 300-600 mg by mouth 3 (three) times daily. TAKE 1 CAPSULE IN THE MORNING , 1 IN AFTERNOON AND 2 CAPSULES BEFORE BED) 120 capsule 3   levothyroxine (SYNTHROID) 75 MCG tablet TAKE 1 TABLET BY MOUTH EVERY DAY BEFORE BREAKFAST 90 tablet 1   Magnesium 400 MG CAPS Take by mouth 2 (two) times daily.     meloxicam (MOBIC) 15 MG tablet TAKE 1 TABLET (15 MG TOTAL) BY MOUTH DAILY. 30 tablet 3   omeprazole (PRILOSEC) 20 MG capsule TAKE 1 CAPSULE BY MOUTH EVERY DAY (Patient taking differently: Take 20 mg by mouth daily.) 90 capsule 1   polyethylene glycol powder (MIRALAX) 17 GM/SCOOP powder Take 17 g by mouth 2 (two) times daily as needed for mild constipation. 255 g 1   traZODone (DESYREL) 50 MG tablet Take 50 mg by mouth at bedtime.      amLODipine (NORVASC) 5 MG tablet Take 1 tablet (5 mg total) by mouth daily. (Patient not taking: Reported on 03/10/2023) 30 tablet 0   Glycerin-Polysorbate 80 (REFRESH DRY EYE THERAPY OP) Place 1 drop into both eyes 3 (three) times daily. (Patient not taking: Reported on 03/10/2023)     magic mouthwash SOLN Take 5 mLs by mouth 3 (three) times daily. Suspension contains equal amounts of Maalox Extra Strength, nystatin, and diphenhydramine. (Patient not taking: Reported on 02/01/2023) 100 mL 0   nystatin (MYCOSTATIN) 100000 UNIT/ML suspension Take by mouth. (Patient not taking: Reported on 03/10/2023)      Allergies as of 03/09/2023 - Review Complete 03/09/2023  Allergen Reaction Noted   Contrast media [iodinated contrast media] Shortness Of Breath 04/20/2014   Dilaudid [hydromorphone hcl] Anaphylaxis 01/21/2012   Adhesive [tape]  12/27/2011   Codeine Nausea Only 05/22/2010   Cymbalta [duloxetine hcl] Other (See Comments) 02/27/2021   Lactose intolerance (gi) Diarrhea 01/22/2012   Latex  05/22/2010   Nsaids  03/30/2021   Lyrica [pregabalin] Other (See Comments) 02/27/2021     Review  of Systems:    This is positive for those things mentioned in the HPI, also positive for chronic joint pains especially the right ankle status post surgery. All other review of systems are negative.       Physical Exam:  Vital signs in last 24 hours: Temp:  [98.2 F (36.8 C)-98.7 F (37.1 C)] 98.3 F (36.8 C) (12/01 0846) Pulse Rate:  [56-103] 76 (12/01 0846) Resp:  [11-25] 19 (12/01 0846) BP: (88-143)/(60-97) 127/83 (12/01 0846) SpO2:  [94 %-100 %] 95 % (12/01 0846) Weight:  [77.1 kg] 77.1 kg (11/30 2124) Last BM Date : 03/10/23  General:  Well-developed, well-nourished and in no acute distress Eyes:  anicteric.  Lungs: Clear to auscultation bilaterally. Heart:   S1S2, no rubs, murmurs, gallops. Abdomen:  soft, non-tender, no hepatosplenomegaly, hernia, or mass and BS+.  Extremities:   no edema  chronically tender right ankle Skin   no rash. Neuro:  A&O x 3.  Psych:  appropriate mood and  Affect.   Data Reviewed:   LAB RESULTS: Recent Labs    03/09/23 2134 03/09/23 2319 03/10/23 0309 03/10/23 0449  WBC 11.5*  --   --   --   HGB 12.9 11.2* 10.7* 9.2*  HCT 38.8 33.9* 32.7* 29.0*  PLT 300  --   --   --    BMET Recent Labs    03/09/23 2134 03/10/23 0449  NA 142 135  K 4.3 3.6  CL 108 109  CO2 27 23  GLUCOSE 129* 116*  BUN 21 26*  CREATININE 1.22* 0.98  CALCIUM 8.9 7.5*      Thanks   LOS: 1 day   @Camden Knotek  Sena Slate, MD, [facility] @  03/10/2023, 11:30 AM

## 2023-03-10 NOTE — ED Notes (Signed)
ED TO INPATIENT HANDOFF REPORT  Name/Age/Gender Tracey Evans 77 y.o. female  Code Status    Code Status Orders  (From admission, onward)           Start     Ordered   03/09/23 2249  Full code  Continuous       Question:  By:  Answer:  Consent: discussion documented in EHR   03/09/23 2250           Code Status History     Date Active Date Inactive Code Status Order ID Comments User Context   09/29/2022 1533 10/03/2022 2108 Full Code [id number]  Alan Mulder, MD ED   10/05/2018 1723 10/06/2018 1552 DNR [id number]  Hughie Closs, MD Inpatient   06/08/2018 1453 06/09/2018 1901 DNR [id number]  Jonah Blue, MD ED   01/21/2012 2315 01/23/2012 1715 Full Code [id number]  George Hugh, RN ED      [place] Directive Documentation    Flowsheet Row Most Recent Value  Type of [place] Directive Living will  Pre-existing out of facility DNR order (yellow form or pink MOST form) --  "MOST" Form in Place? --       Home/SNF/Other Home  Chief Complaint Acute GI bleeding [K92.2]  Level of Care/Admitting Diagnosis ED Disposition     ED Disposition  Admit   Condition  --   Comment  Hospital Area: [facility] [[id number]]  Level of Care: Telemetry [5]  Admit to tele based on following criteria: Monitor QTC interval  May admit patient to [facility] or [facility] if equivalent level of care is available:: Yes  Covid Evaluation: Asymptomatic - no recent exposure (last 10 days) testing not required  Diagnosis: Acute GI bleeding [742595]  Admitting Physician: Briscoe Deutscher [[id number]]  Attending Physician: Briscoe Deutscher [[id number]]  Certification:: I certify this patient will need inpatient services for at least 2 midnights  Expected Medical Readiness: 03/11/2023          Medical History Past Medical History:  Diagnosis Date   Angina at rest [place])    "occurs whenever it wants to, but worse during agitation"   Anxiety    Asthma    Binge eating  disorder    Breast cancer of lower-outer quadrant of left female breast (HCC) 07/08/2015   "NEVER HAD LEFT BREAST CANCER" (08/15/2015)   Cancer of right breast (HCC) 08/15/2015   Chronic bronchitis (HCC)    Chronic lower back pain    Complication of anesthesia    had bronc spasms during intubation surgery 2009 on foot-need albuterol inhaler or neb tx preop   Costochondritis    Depression    Diverticular disease    11-2020 bleed   Fibromyalgia    GERD (gastroesophageal reflux disease)    Hot flashes    Hyperlipidemia    Hypertension    Hypothyroidism    Osteoarthritis    "all over"   Peptic ulcer disease    Personal history of chemotherapy 2017   Shortness of breath dyspnea    Type II diabetes mellitus (HCC)    "diet controlled" (08/15/2015)    Allergies Allergies  Allergen Reactions   Contrast Media [Iodinated Contrast Media] Shortness Of Breath    Patient has SOB, tightness in chest and feels like an elephant is sitting on chest, patient should be  scanned at hospital Per Dr. Gery Pray   Dilaudid [Hydromorphone Hcl] Anaphylaxis    Pt stopped breathing   Adhesive [Tape]  blister   Codeine Nausea Only    insomnia   Cymbalta [Duloxetine Hcl] Other (See Comments)    Hallucinations.    Lactose Intolerance (Gi) Diarrhea   Latex     REACTION: burns skin- Latex tape only    Nsaids    Lyrica [Pregabalin] Other (See Comments)    Mood swings.     IV Location/Drains/Wounds Patient Lines/Drains/Airways Status     Active Line/Drains/Airways     Name Placement date Placement time Site Days   Peripheral IV 03/09/23 20 G Left Antecubital 03/09/23  2312  Antecubital  1   Peripheral IV 03/10/23 18 G Left;Posterior Hand 03/10/23  0319  Hand  less than 1            Labs/Imaging Results for orders placed or performed during the hospital encounter of 03/09/23 (from the past 48 hour(s))  Type and screen [facility]     Status: None (Preliminary result)    Collection Time: 03/09/23  9:30 PM  Result Value Ref Range   ABO/RH(D) O POS    Antibody Screen NEG    Sample Expiration      03/12/2023,2359 Performed at [facility], [address], [place], [place] [address]    Unit Number [id number]    Blood Component Type RED CELLS,LR    Unit division 00    Status of Unit ALLOCATED    Transfusion Status OK TO TRANSFUSE    Crossmatch Result Compatible   CBC with Differential     Status: Abnormal   Collection Time: 03/09/23  9:34 PM  Result Value Ref Range   WBC 11.5 (H) 4.0 - 10.5 K/uL   RBC 4.03 3.87 - 5.11 MIL/uL   Hemoglobin 12.9 12.0 - 15.0 g/dL   HCT 40.3 47.4 - 25.9 %   MCV 96.3 80.0 - 100.0 fL   MCH 32.0 26.0 - 34.0 pg   MCHC 33.2 30.0 - 36.0 g/dL   RDW 56.3 87.5 - 64.3 %   Platelets 300 150 - 400 K/uL   nRBC 0.0 0.0 - 0.2 %   Neutrophils Relative % 64 %   Neutro Abs 7.4 1.7 - 7.7 K/uL   Lymphocytes Relative 25 %   Lymphs Abs 2.9 0.7 - 4.0 K/uL   Monocytes Relative 8 %   Monocytes Absolute 0.9 0.1 - 1.0 K/uL   Eosinophils Relative 2 %   Eosinophils Absolute 0.2 0.0 - 0.5 K/uL   Basophils Relative 1 %   Basophils Absolute 0.1 0.0 - 0.1 K/uL   Immature Granulocytes 0 %   Abs Immature Granulocytes 0.05 0.00 - 0.07 K/uL    Comment: Performed at [facility], [address] [place], [place] [address]  Basic metabolic panel     Status: Abnormal   Collection Time: 03/09/23  9:34 PM  Result Value Ref Range   Sodium 142 135 - 145 mmol/L   Potassium 4.3 3.5 - 5.1 mmol/L   Chloride 108 98 - 111 mmol/L   CO2 27 22 - 32 mmol/L   Glucose, Bld 129 (H) 70 - 99 mg/dL    Comment: Glucose reference range applies only to samples taken after fasting for at least 8 hours.   BUN 21 8 - 23 mg/dL   Creatinine, Ser 8.84 (H) 0.44 - 1.00 mg/dL   Calcium 8.9 8.9 - 16.6 mg/dL   GFR, Estimated 46 (L) >60 mL/min    Comment: (NOTE) Calculated using the CKD-EPI Creatinine Equation (2021)  Anion gap 7 5 -  15    Comment: Performed at [facility], [address] [place], [place] [address]  Hemoglobin     Status: Abnormal   Collection Time: 03/09/23 11:19 PM  Result Value Ref Range   Hemoglobin 11.2 (L) 12.0 - 15.0 g/dL    Comment: Performed at [facility], [address], [place], [place] [address]  Hematocrit     Status: Abnormal   Collection Time: 03/09/23 11:19 PM  Result Value Ref Range   HCT 33.9 (L) 36.0 - 46.0 %    Comment: Performed at [facility], [address] [place], [place] [address]  CBG monitoring, ED     Status: Abnormal   Collection Time: 03/10/23  3:04 AM  Result Value Ref Range   Glucose-Capillary 100 (H) 70 - 99 mg/dL    Comment: Glucose reference range applies only to samples taken after fasting for at least 8 hours.  Hemoglobin and hematocrit, blood     Status: Abnormal   Collection Time: 03/10/23  3:09 AM  Result Value Ref Range   Hemoglobin 10.7 (L) 12.0 - 15.0 g/dL   HCT 91.4 (L) 78.2 - 95.6 %    Comment: Performed at [facility], [address], [place], [place] [address]  Prepare RBC (crossmatch)     Status: None   Collection Time: 03/10/23  3:30 AM  Result Value Ref Range   Order Confirmation      ORDER PROCESSED BY BLOOD BANK Performed at [facility], [address], [place], [place] [address]   Basic metabolic panel     Status: Abnormal   Collection Time: 03/10/23  4:49 AM  Result Value Ref Range   Sodium 135 135 - 145 mmol/L   Potassium 3.6 3.5 - 5.1 mmol/L   Chloride 109 98 - 111 mmol/L   CO2 23 22 - 32 mmol/L   Glucose, Bld 116 (H) 70 - 99 mg/dL    Comment: Glucose reference range applies only to samples taken after fasting for at least 8 hours.   BUN 26 (H) 8 - 23 mg/dL   Creatinine, Ser 6.96 0.44 - 1.00 mg/dL   Calcium 7.5 (L) 8.9 - 10.3 mg/dL   GFR, Estimated 59 (L) >60 mL/min    Comment: (NOTE) Calculated using the CKD-EPI Creatinine  Equation (2021)    Anion gap 3 (L) 5 - 15    Comment: Performed at [facility], [address] [place], [place] [address]  Hemoglobin     Status: Abnormal   Collection Time: 03/10/23  4:49 AM  Result Value Ref Range   Hemoglobin 9.2 (L) 12.0 - 15.0 g/dL    Comment: Performed at [facility], [address], [place], [place] [address]  Hematocrit     Status: Abnormal   Collection Time: 03/10/23  4:49 AM  Result Value Ref Range   HCT 29.0 (L) 36.0 - 46.0 %    Comment: Performed at [facility], [address] [place], [place] [address]  CBG monitoring, ED     Status: None   Collection Time: 03/10/23  8:43 AM  Result Value Ref Range   Glucose-Capillary 92 70 - 99 mg/dL    Comment: Glucose reference range applies only to samples taken after fasting for at least 8 hours.   *Note: Due to a large number of results and/or encounters for the requested time period, some results have  not been displayed. A complete set of results can be found in Results Review.   No results found.  Pending Labs Unresulted Labs (From admission, onward)     Start     Ordered   03/10/23 0500  Basic metabolic panel  Daily,   R      03/09/23 2250   03/09/23 2249  Hemoglobin  Now then every 6 hours,   R (with TIMED occurrences)      03/09/23 2250   03/09/23 2249  Hematocrit  Now then every 6 hours,   R (with TIMED occurrences)      03/09/23 2250            Vitals/Pain Today's Vitals   03/10/23 0630 03/10/23 0645 03/10/23 0743 03/10/23 0846  BP: 109/74 110/79  127/83  Pulse: 71 70  76  Resp: 15 15  19   Temp:    98.3 F (36.8 C)  TempSrc:    Oral  SpO2: 97% 96%  95%  Weight:      Height:      PainSc:   Asleep     Isolation Precautions No active isolations  Medications Medications  gabapentin (NEURONTIN) capsule 600 mg (600 mg Oral Given 03/09/23 2337)  levothyroxine (SYNTHROID) tablet 75 mcg (75 mcg Oral Given 03/10/23 0548)   atorvastatin (LIPITOR) tablet 20 mg (20 mg Oral Given 03/10/23 0941)  buPROPion (WELLBUTRIN XL) 24 hr tablet 300 mg (300 mg Oral Given 03/10/23 0939)  sodium chloride flush (NS) 0.9 % injection 3 mL (3 mLs Intravenous Given 03/10/23 1052)  0.45 % sodium chloride infusion ( Intravenous Infusion Verify 03/10/23 0903)  acetaminophen (TYLENOL) tablet 650 mg (has no administration in time range)    Or  acetaminophen (TYLENOL) suppository 650 mg (has no administration in time range)  ondansetron (ZOFRAN) tablet 4 mg (has no administration in time range)    Or  ondansetron (ZOFRAN) injection 4 mg (has no administration in time range)  gabapentin (NEURONTIN) capsule 300 mg (300 mg Oral Given 03/10/23 0939)  simethicone (MYLICON) chewable tablet 80 mg (80 mg Oral Given 03/10/23 0130)  0.9 %  sodium chloride infusion (Manually program via Guardrails IV Fluids) (has no administration in time range)  insulin aspart (novoLOG) injection 0-6 Units (0 Units Subcutaneous Hold 03/10/23 0903)  sodium chloride 0.9 % bolus 500 mL (0 mLs Intravenous Stopped 03/10/23 0430)  sodium chloride 0.9 % bolus 500 mL (0 mLs Intravenous Stopped 03/10/23 0429)    Mobility walks with device

## 2023-03-10 NOTE — Assessment & Plan Note (Signed)
-   Continue bupropion, gabapentin - Hold Mobic

## 2023-03-10 NOTE — Progress Notes (Signed)
  Progress Note   Patient: Tracey Evans:[id number] DOB: 1945-05-14 DOA: 03/09/2023     1 DOS: the patient was seen and examined on 03/10/2023 at 8:18 AM      Brief hospital course: 78 y.o. F with DM, HTN, HLD, diverticulosis and diverticulitis with contained perf in Jun 2024, hypothyroidism, anxiety, fibromyalgia and BrCA in remission who presented with painless bright red rectal bleeding.     Assessment and Plan: * Acute GI bleeding Painless bright red rectal bleeding with clots.  Had diverticular bleeding 2 years ago in [place] that was associated with blood transfusion and cardiac arrest she believes.  Currently bleeding has stopped, hemodynamics stable, Hgb trended down slightly - Trend Hgb - Stop Mobic - Consult GI, appreciate cares - Clear liquids    Acute blood loss anemia Hemoglobin is stabilized - Serial hemoglobins as above  Vasovagal episode Patient had a brief vagal episode overnight. -Continue telemetry monitoring 24-more hours  Breast cancer of lower-outer quadrant of right female breast (HCC)    Fibromyalgia - Continue bupropion, gabapentin - Hold Mobic  DM w/o complication type II (HCC) Glucose controlled Diet controlled at baseline - Sliding scale correction - Gabapentin   Essential hypertension Blood pressure normal - Hold amlodipine  Hyperlipidemia associated with type 2 diabetes mellitus (HCC) - Continue atorvastatin  Hypothyroidism - Continue levothyroxine          Subjective: No further bleeding since overnight.  No abdominal pain.  Mentation is normal.  Nursing of no concerns.  Patient is hungry.     Physical Exam: BP 113/76 (BP Location: Left Arm)   Pulse 70   Temp 98 F (36.7 C) (Oral)   Resp 20   Ht 5\' 4"  (1.626 m)   Wt 77.1 kg   SpO2 100%   BMI 29.18 kg/m   Adult female, lying in bed, interactive and appropriate RRR, no murmurs, no peripheral edema Respiratory rate normal, lungs clear without rales or  wheezes Abdomen soft without tenderness palpation or guarding, no ascites or distention Attention normal, affect normal, judgment and insight appear normal    Data Reviewed: Serial hemoglobin show hemoglobin trending from 12.9 down to 9.2 and stabilizing there Basic metabolic panel unremarkable    Family Communication: None present    Disposition: Status is: Inpatient Patient's had a clinically significant GI bleed, she requires ongoing monitoring, and further GI evaluation        Author: Alberteen Sam, MD 03/10/2023 12:47 PM  For on call review [email or web address].

## 2023-03-10 NOTE — Assessment & Plan Note (Signed)
Glucose controlled Diet controlled at baseline - Sliding scale correction - Gabapentin

## 2023-03-10 NOTE — ED Notes (Signed)
This RN was at bedside with pt while pt was on the [facility] having a bowel movement. Pt reported she felt like she was going to pass out, then pt lost consciousness. This RN called for help and notified charge RN, [place]. Pt had an irregular HR and was unresponsive. Code cart was brought to bedside and pt was put into bed. Pt regained consciousness after getting in bed and HR returned to normal after she became alert. [place], ED MD came to bedside and evaluated that pt likely vagled out. Virgel Manifold, NP notified. Virgel Manifold, NP came to bedside to evaluate pt and placed orders. Pt is A&O x4 now with stable vital signs.

## 2023-03-10 NOTE — Assessment & Plan Note (Signed)
Painless bright red rectal bleeding with clots.  Had diverticular bleeding 2 years ago in [place] that was associated with blood transfusion and cardiac arrest she believes.  Currently bleeding has stopped, hemodynamics stable, Hgb trended down slightly - Trend Hgb - Stop Mobic - Consult GI, appreciate cares - Clear liquids

## 2023-03-10 NOTE — Assessment & Plan Note (Signed)
Continue levothyroxine 

## 2023-03-10 NOTE — Assessment & Plan Note (Signed)
Continue atorvastatin

## 2023-03-10 NOTE — Progress Notes (Signed)
    Patient Name: Tracey Evans           DOB: [birth year]  MRN: [id number]      Admission Date: 03/09/2023  Attending Provider: Briscoe Deutscher, MD  Primary Diagnosis: Acute GI bleeding   Level of care: Telemetry    CROSS COVER NOTE   Date of Service   03/10/2023   Tracey Evans, 77 y.o. female, was admitted on 03/09/2023 for Acute GI bleeding.    HPI/Events of Note   Syncope, symptomatic with hypotension and bradycardia Episode was short in duration, occurred while patient was having a "large GI bleed."  Patient likely vasovagal during blood loss.   Last hemoglobin level collected was at 2319, hemoglobin 11.2.  Since then, patient has had 2 episodes of GI bleed.  Last GI bleed ~300 to 500 cc in volume.  Recheck H&H stat. During this episode patient was hypotensive (SBP 80s) and bradycardic with HR~ 50's. Cardiac telemetry picked up arrhythmia (V. tach vs possible artifact?).  Obtain EKG and administer fluid bolus. RN also reports patient is currently conscious, but weak and slightly confused.  Bedside Assessment:  Patient is awake, A/O x4, with no associated distress.  Currently resting in bed.   Hemodynamically stable, BP 101/63. Patient states she feels better now. Denies lightheadedness, palpitations, chest pain, shortness of breath.     Interventions/ Plan   H&H Fluid bolus, 500 cc EKG        Anthoney Harada, DNP, ACNPC- AG [facility]

## 2023-03-10 NOTE — Assessment & Plan Note (Signed)
Blood pressure normal - Hold amlodipine

## 2023-03-10 NOTE — ED Notes (Addendum)
Pt passed a moderately sized bright red blood clot in stool. Opyd, MD aware.

## 2023-03-10 NOTE — Assessment & Plan Note (Signed)
Patient had a brief vagal episode overnight. -Continue telemetry monitoring 24-more hours

## 2023-03-10 NOTE — ED Notes (Signed)
Changed pt linen an cleaned pt up.

## 2023-03-10 NOTE — Assessment & Plan Note (Signed)
Hemoglobin is stabilized - Serial hemoglobins as above

## 2023-03-11 DIAGNOSIS — D62 Acute posthemorrhagic anemia: Secondary | ICD-10-CM | POA: Diagnosis not present

## 2023-03-11 DIAGNOSIS — K921 Melena: Secondary | ICD-10-CM

## 2023-03-11 DIAGNOSIS — R55 Syncope and collapse: Secondary | ICD-10-CM | POA: Diagnosis not present

## 2023-03-11 DIAGNOSIS — K922 Gastrointestinal hemorrhage, unspecified: Secondary | ICD-10-CM | POA: Diagnosis not present

## 2023-03-11 LAB — PROTIME-INR
INR: 1.2 (ref 0.8–1.2)
Prothrombin Time: 15.1 s (ref 11.4–15.2)

## 2023-03-11 LAB — GLUCOSE, CAPILLARY: Glucose-Capillary: 113 mg/dL — ABNORMAL HIGH (ref 70–99)

## 2023-03-11 LAB — CBC
HCT: 30.4 % — ABNORMAL LOW (ref 36.0–46.0)
Hemoglobin: 9.8 g/dL — ABNORMAL LOW (ref 12.0–15.0)
MCH: 31.6 pg (ref 26.0–34.0)
MCHC: 32.2 g/dL (ref 30.0–36.0)
MCV: 98.1 fL (ref 80.0–100.0)
Platelets: 217 10*3/uL (ref 150–400)
RBC: 3.1 MIL/uL — ABNORMAL LOW (ref 3.87–5.11)
RDW: 14.6 % (ref 11.5–15.5)
WBC: 6.3 10*3/uL (ref 4.0–10.5)
nRBC: 0 % (ref 0.0–0.2)

## 2023-03-11 LAB — BASIC METABOLIC PANEL
Anion gap: 5 (ref 5–15)
BUN: 13 mg/dL (ref 8–23)
CO2: 23 mmol/L (ref 22–32)
Calcium: 8.4 mg/dL — ABNORMAL LOW (ref 8.9–10.3)
Chloride: 109 mmol/L (ref 98–111)
Creatinine, Ser: 0.86 mg/dL (ref 0.44–1.00)
GFR, Estimated: >60 mL/min (ref >60)
Glucose, Bld: 98 mg/dL (ref 70–99)
Potassium: 3.7 mmol/L (ref 3.5–5.1)
Sodium: 137 mmol/L (ref 135–145)

## 2023-03-11 NOTE — Progress Notes (Signed)
Mobility Specialist - Progress Note   03/11/23 0950  Mobility  Activity Ambulated with assistance in hallway  Level of Assistance Modified independent, requires aide device or extra time  Assistive Device None  Distance Ambulated (ft) 1000 ft  Range of Motion/Exercises Active  Activity Response Tolerated well  Mobility Referral Yes  $Mobility charge 1 Mobility  Mobility Specialist Start Time (ACUTE ONLY) 0850  Mobility Specialist Stop Time (ACUTE ONLY) 0915  Mobility Specialist Time Calculation (min) (ACUTE ONLY) 25 min   Received in bed and agreed to mobility, had no issues throughout session. Returned to bed with all needs met.  Marilynne Halsted Mobility Specialist

## 2023-03-11 NOTE — Plan of Care (Signed)
  Problem: Coping: Goal: Ability to adjust to condition or change in health will improve Outcome: Progressing   Problem: Nutritional: Goal: Maintenance of adequate nutrition will improve Outcome: Progressing   Problem: Education: Goal: Knowledge of General Education information will improve Description: Including pain rating scale, medication(s)/side effects and non-pharmacologic comfort measures Outcome: Progressing   Problem: Activity: Goal: Risk for activity intolerance will decrease Outcome: Progressing   Problem: Coping: Goal: Level of anxiety will decrease Outcome: Progressing

## 2023-03-11 NOTE — Progress Notes (Signed)
   03/11/23 1022  TOC Brief Assessment  Insurance and Status Reviewed  Patient has primary care physician Yes  Home environment has been reviewed Apartment  Prior level of function: modified independent  Prior/Current Home Services No current home services  Social Determinants of Health Reivew SDOH reviewed no interventions necessary  Readmission risk has been reviewed Yes  Transition of care needs no transition of care needs at this time

## 2023-03-11 NOTE — Discharge Summary (Signed)
Physician Discharge Summary   Patient: Tracey Evans MRN: [id number] DOB: May 30, 1945  Admit date:     03/09/2023  Discharge date: 03/11/23  Discharge Physician: Alberteen Sam   PCP: Sheliah Hatch, MD     Recommendations at discharge:  Follow-up with PCP, Dr. Beverely Low in 2 weeks for lower GI bleed, presumed diverticular Dr. Beverely Low: Please check CBC in 1 week prior to appointment GI follow up only PRN     Discharge Diagnoses: Principal Problem:   Acute GI bleeding Active Problems:   Acute blood loss anemia   Vasovagal episode   Hypothyroidism   Hyperlipidemia associated with type 2 diabetes mellitus (HCC)   Essential hypertension   DM w/o complication type II (HCC)   Fibromyalgia   Breast cancer of lower-outer quadrant of right female breast [facility])      Hospital Course: 77 y.o. F with DM, HTN, HLD, diverticulosis and diverticulitis with contained perf in Jun 2024, hypothyroidism, anxiety, fibromyalgia and BrCA in remission who presented with painless bright red rectal bleeding.      * Acute GI bleeding Admitted with painless bright red rectal bleeding with clots.  Had diverticular bleeding 2 years ago in [place] that was associated with blood transfusion and cardiac arrest she believes.    Here she was observed for 48 hours, the bleeding stopped, her hemoglobin trended from 12 down to 9.8, and stabilized.  He was evaluated by GI who recommended no endoscopy.  GI recommended stopping Mobic, soft diet for the few days, and repeat CBC in 1 week.     Vasovagal episode Patient did have a brief episode after one of her large bowel movements which she became bradycardic and hypotensive transiently.  This resolved spontaneously, and required no further treatment, suspected to be a vagal episode from hematochezia.               The [facility] Controlled Substances Registry was reviewed for this patient prior to discharge.  Consultants:  Gastroenterology, Dr. Leone Payor Procedures performed: None Disposition: Home Diet recommendation: Soft diet for 3 days, then resume normal home diet   DISCHARGE MEDICATION: Allergies as of 03/11/2023       Reactions   Contrast Media [iodinated Contrast Media] Shortness Of Breath   Patient has SOB, tightness in chest and feels like an elephant is sitting on chest, patient should be  scanned at hospital Per Dr. Gery Pray   Dilaudid [place] Hcl] Anaphylaxis   Pt stopped breathing   Adhesive [tape]    blister   Codeine Nausea Only   insomnia   Cymbalta [duloxetine Hcl] Other (See Comments)   Hallucinations.    Lactose Intolerance (gi) Diarrhea   Latex    REACTION: burns skin- Latex tape only    Nsaids    Lyrica [pregabalin] Other (See Comments)   Mood swings.         Medication List     STOP taking these medications    meloxicam 15 MG tablet Commonly known as: MOBIC       TAKE these medications    aspirin 81 MG tablet Take 81 mg by mouth daily.   atorvastatin 20 MG tablet Commonly known as: LIPITOR Take 1 tablet (20 mg total) by mouth daily.   buPROPion 300 MG 24 hr tablet Commonly known as: Wellbutrin XL Take 1 tablet (300 mg total) by mouth daily.   gabapentin 300 MG capsule Commonly known as: NEURONTIN TAKE 1 CAPSULE IN THE MORNING , 1 IN AFTERNOON AND 2  CAPSULES BEFORE BED What changed:  how much to take how to take this when to take this   levothyroxine 75 MCG tablet Commonly known as: SYNTHROID TAKE 1 TABLET BY MOUTH EVERY DAY BEFORE BREAKFAST   magic mouthwash Soln Take 5 mLs by mouth 3 (three) times daily. Suspension contains equal amounts of Maalox Extra Strength, nystatin, and diphenhydramine.   Magnesium 400 MG Caps Take by mouth 2 (two) times daily.   nystatin 100000 UNIT/ML suspension Commonly known as: MYCOSTATIN Take by mouth.   omeprazole 20 MG capsule Commonly known as: PRILOSEC TAKE 1 CAPSULE BY MOUTH EVERY DAY What changed:  how much to take   polyethylene glycol powder 17 GM/SCOOP powder Commonly known as: MiraLax Take 17 g by mouth 2 (two) times daily as needed for mild constipation.   REFRESH DRY EYE THERAPY OP Place 1 drop into both eyes 3 (three) times daily.   traZODone 50 MG tablet Commonly known as: DESYREL Take 50 mg by mouth at bedtime.        Follow-up Information     Sheliah Hatch, MD. Schedule an appointment as soon as possible for a visit in 1 week(s).   Specialty: Family Medicine Contact information: [address] [place] [address] [phone]                 Discharge Instructions     Discharge instructions   Complete by: As directed    **IMPORTANT DISCHARGE INSTRUCTIONS**   From Dr. Maryfrances Bunnell: You were admitted for rectal bleeding  Here, thankfully, the bleeding stopped, and your hemoglobin (blood level) only from from ~12 g/dL to 9.8 g/dL today  Go see Dr. Beverely Low at your upcoming appointment Call her office this afternoon to ask if you can have a CBC checked later this week or early next week before that appointment  For the next week, HOLD (do not take) your low dose aspirin 81 mg daily Then resume it  Eat a low fiber diet for the next week (no rough vegetables or heavy fiber cereal), then return to normal  If you can, stop Mobic/meloxicam If this is something you really need, resume it, but discuss with Dr. Beverely Low  No particular need for GI follow up, unless you and Dr. Beverely Low wish  Resume your other home medicines without change   Increase activity slowly   Complete by: As directed        Discharge Exam: Filed Weights   03/09/23 2124 03/11/23 0542  Weight: 77.1 kg 76.2 kg    General: Pt is alert, awake, not in acute distress Cardiovascular: RRR, nl S1-S2, no murmurs appreciated.   No LE edema.   Respiratory: Normal respiratory rate and rhythm.  CTAB without rales or wheezes. Abdominal: Abdomen soft and non-tender.  No  distension or HSM.   Neuro/Psych: Strength symmetric in upper and lower extremities.  Judgment and insight appear normal.   Condition at discharge: good  The results of significant diagnostics from this hospitalization (including imaging, microbiology, ancillary and laboratory) are listed below for reference.   Imaging Studies: No results found.  Microbiology: Results for orders placed or performed during the hospital encounter of 01/18/23  Urine Culture     Status: Abnormal   Collection Time: 01/18/23  2:05 PM   Specimen: Urine, Clean Catch  Result Value Ref Range Status   Specimen Description   Final    URINE, CLEAN CATCH Performed at [facility], [address] [place],  [place] [address]    Special Requests   Final    NONE Performed at [facility], [address] [place], [place] [address]    Culture MULTIPLE SPECIES PRESENT, SUGGEST RECOLLECTION (A)  Final   Report Status 01/19/2023 FINAL  Final   *Note: Due to a large number of results and/or encounters for the requested time period, some results have not been displayed. A complete set of results can be found in Results Review.    Labs: CBC: Recent Labs  Lab 03/09/23 2134 03/09/23 2319 03/10/23 0309 03/10/23 0449 03/10/23 1130 03/11/23 0510  WBC 11.5*  --   --   --   --  6.3  NEUTROABS 7.4  --   --   --   --   --   HGB 12.9 11.2* 10.7* 9.2* 9.3* 9.8*  HCT 38.8 33.9* 32.7* 29.0* 28.7* 30.4*  MCV 96.3  --   --   --   --  98.1  PLT 300  --   --   --   --  217   Basic Metabolic Panel: Recent Labs  Lab 03/09/23 2134 03/10/23 0449 03/11/23 0510  NA 142 135 137  K 4.3 3.6 3.7  CL 108 109 109  CO2 27 23 23   GLUCOSE 129* 116* 98  BUN 21 26* 13  CREATININE 1.22* 0.98 0.86  CALCIUM 8.9 7.5* 8.4*   Liver Function Tests: No results for input(s): "AST", "ALT", "ALKPHOS", "BILITOT", "PROT", "ALBUMIN" in the last 168 hours. CBG: Recent Labs  Lab 03/10/23 0843  03/10/23 1207 03/10/23 1648 03/10/23 2121 03/11/23 0814  GLUCAP 92 72 87 81 113*    Discharge time spent: approximately 35 minutes spent on discharge counseling, evaluation of patient on day of discharge, and coordination of discharge planning with nursing, social work, pharmacy and case management  Signed: Alberteen Sam, MD [facility] 03/11/2023

## 2023-03-11 NOTE — Progress Notes (Addendum)
    Progress Note   Subjective  Chief Complaint: Lower GI bleeding  This morning, patient tells me that she was wiping and anytime she did she saw some clots and blood and a small amount, but she was told this was residual.  She then took a shower early this morning and realized that it took her quite a while to get "clean back there", now that she has, things are cleared up.  She feels good and is okay with going home.   Objective   Vital signs in last 24 hours: Temp:  [97.7 F (36.5 C)-98.3 F (36.8 C)] 98 F (36.7 C) (12/02 0542) Pulse Rate:  [67-80] 67 (12/02 0542) Resp:  [15-20] 16 (12/02 0542) BP: (103-126)/(60-77) 111/71 (12/02 0542) SpO2:  [94 %-100 %] 94 % (12/02 0542) Weight:  [76.2 kg] 76.2 kg (12/02 0542) Last BM Date : 03/10/23 General:   AA  female in NAD Heart:  Regular rate and rhythm; no murmurs Lungs: Respirations even and unlabored, lungs CTA bilaterally Abdomen:  Soft, nontender and nondistended. Normal bowel sounds. Psych:  Cooperative. Normal mood and affect.  Intake/Output from previous day: 12/01 0701 - 12/02 0700 In: 868.5 [P.O.:240; I.V.:628.5] Out: 750 [Urine:750]  Lab Results: Recent Labs    03/09/23 2134 03/09/23 2319 03/10/23 0449 03/10/23 1130 03/11/23 0510  WBC 11.5*  --   --   --  6.3  HGB 12.9   < > 9.2* 9.3* 9.8*  HCT 38.8   < > 29.0* 28.7* 30.4*  PLT 300  --   --   --  217   < > = values in this interval not displayed.   BMET Recent Labs    03/09/23 2134 03/10/23 0449 03/11/23 0510  NA 142 135 137  K 4.3 3.6 3.7  CL 108 109 109  CO2 27 23 23   GLUCOSE 129* 116* 98  BUN 21 26* 13  CREATININE 1.22* 0.98 0.86  CALCIUM 8.9 7.5* 8.4*   PT/INR Recent Labs    03/11/23 0510  LABPROT 15.1  INR 1.2     Assessment / Plan:   Assessment: 1.  Hematochezia with acute blood loss anemia: In the setting of known pandiverticulosis, most likely diverticular hemorrhage, hemoglobin is now increasing overnight with slowing of  bleeding 2.  Transient vasovagal reaction with syncope: During passage of bloody stool, related to above  Plan: 1.  Okay with patient discharge home today. 2.  Recommend a low fiber diet for the next 2 to 3 days, then gradually increase back to normal 3.  Patient does not need specific follow-up in the GI office but would recommend follow-up with PCP to recheck hemoglobin later this week or early next  We will sign off.  Thank you for your kind consultation.   LOS: 2 days   Unk Lightning  03/11/2023, 10:28 AM  GI ATTENDING  Interval history data reviewed.  Case reviewed with Dr. Leone Payor.  Agree with interval progress note as outlined above.  The patient has been discharged home as noted.  Wilhemina Bonito. Eda Keys., M.D. [facility] Division of Gastroenterology

## 2023-03-12 ENCOUNTER — Telehealth: Payer: Self-pay

## 2023-03-12 NOTE — Transitions of Care (Post Inpatient/ED Visit) (Signed)
   03/12/2023  Name: Tracey Evans MRN: [id number] DOB: July 15, 1945  Today's TOC FU Call Status: Today's TOC FU Call Status:: Unsuccessful Call (1st Attempt) Unsuccessful Call (1st Attempt) Date: 03/12/23  Attempted to reach the patient regarding the most recent Inpatient/ED visit.  Follow Up Plan: Additional outreach attempts will be made to reach the patient to complete the Transitions of Care (Post Inpatient/ED visit) call.      Antionette Fairy, RN,BSN,CCM RN Care Manager Transitions of Care  [place]  Direct Phone: [phone] Toll Free: [phone] Fax: [phone]

## 2023-03-12 NOTE — Transitions of Care (Post Inpatient/ED Visit) (Signed)
03/12/2023  Name: Tracey Evans MRN: [id number] DOB: 08/17/1945  Today's TOC FU Call Status: Today's TOC FU Call Status:: Successful TOC FU Call Completed (Incoming call from pt returning RN CM call) Patient's Name and Date of Birth confirmed.  Transition Care Management Follow-up Telephone Call Date of Discharge: 03/11/23 Discharge Facility: [facility]) Type of Discharge: Inpatient Admission Primary Inpatient Discharge Diagnosis:: "BRBPR" How have you been since you were released from the hospital?: Better (Pt pleased to report how much better she is feeling. She went back to work this morning because she was feeling so good. She rested well last night. Appetite remains good. She had  a normal BM last night.) Any questions or concerns?: No  Items Reviewed: Did you receive and understand the discharge instructions provided?: Yes Medications obtained,verified, and reconciled?: Yes (Medications Reviewed) (per d/c instructions pt to hold ASA for the next week states she was not advised of this and was not aware-she took dosage this morning but will hold for a week as advised) Any new allergies since your discharge?: No Dietary orders reviewed?: Yes Type of Diet Ordered:: "no rough vegetables or high fiber cereal" Do you have support at home?: Yes People in Home: child(ren), adult Name of Support/Comfort Primary Source: daughter able to assist her as needed  Medications Reviewed Today: Medications Reviewed Today     Reviewed by Charlyn Minerva, RN (Registered Nurse) on 03/12/23 at [phone]  Med List Status: <None>   Medication Order Taking? Sig Documenting Provider Last Dose Status Informant  aspirin 81 MG tablet [id number]  Take 81 mg by mouth daily. [provider]  Active Self, Pharmacy Records           Med Note Jovita Kussmaul, [facility]   Tue Mar 12, 2023  9:36 AM) Per d/c instructions : "For the next week, HOLD (do not take) your low dose aspirin 81 mg  daily Then resume it"  atorvastatin (LIPITOR) 20 MG tablet [id number] Yes Take 1 tablet (20 mg total) by mouth daily. Sheliah Hatch, MD Taking Active Self, Pharmacy Records  buPROPion (WELLBUTRIN XL) 300 MG 24 hr tablet [id number] Yes Take 1 tablet (300 mg total) by mouth daily. Sheliah Hatch, MD Taking Active Self, Pharmacy Records  gabapentin (NEURONTIN) 300 MG capsule [id number] Yes TAKE 1 CAPSULE IN THE MORNING , 1 IN AFTERNOON AND 2 CAPSULES BEFORE BED  Patient taking differently: Take 300-600 mg by mouth 3 (three) times daily. TAKE 1 CAPSULE IN THE MORNING , 1 IN AFTERNOON AND 2 CAPSULES BEFORE BED   Sheliah Hatch, MD Taking Active Self, Pharmacy Records  Glycerin-Polysorbate 80 [facility] DRY EYE THERAPY OP) [id number] Yes Place 1 drop into both eyes 3 (three) times daily. [provider] Taking Active Self, Pharmacy Records  levothyroxine (SYNTHROID) 75 MCG tablet [id number] Yes TAKE 1 TABLET BY MOUTH EVERY DAY BEFORE BREAKFAST Sheliah Hatch, MD Taking Active Self, Pharmacy Records  magic mouthwash SOLN [id number]  Take 5 mLs by mouth 3 (three) times daily. Suspension contains equal amounts of Maalox Extra Strength, nystatin, and diphenhydramine.  Patient not taking: Reported on 02/01/2023   Clent Ridges  Active Self, Pharmacy Records  Magnesium 400 MG CAPS [id number] Yes Take by mouth 2 (two) times daily. [provider] Taking Active Self, Pharmacy Records  nystatin (MYCOSTATIN) 100000 UNIT/ML suspension [id number]  Take by mouth.  Patient not taking: Reported on 03/10/2023   [provider]  Active Self, Pharmacy Records  omeprazole [facility])  20 MG capsule [id number] Yes TAKE 1 CAPSULE BY MOUTH EVERY DAY  Patient taking differently: Take 20 mg by mouth daily.   Sheliah Hatch, MD Taking Active Self, Pharmacy Records  polyethylene glycol powder [facility]) 17 GM/SCOOP powder [id number] Yes Take 17 g by mouth 2 (two) times daily as  needed for mild constipation. Almon Hercules, MD Taking Active Self, Pharmacy Records  traZODone (DESYREL) 50 MG tablet [id number] Yes Take 50 mg by mouth at bedtime. [provider] Taking Active Self, Pharmacy Records            Home Care and Equipment/Supplies: Were Home Health Services Ordered?: NA Any new equipment or medical supplies ordered?: NA  Functional Questionnaire: Do you need assistance with bathing/showering or dressing?: No Do you need assistance with meal preparation?: No Do you need assistance with eating?: No Do you have difficulty maintaining continence: No Do you need assistance with getting out of bed/getting out of a chair/moving?: No Do you have difficulty managing or taking your medications?: No  Follow up appointments reviewed: PCP Follow-up appointment confirmed?: Yes Date of PCP follow-up appointment?: 03/15/23 Follow-up Provider: Dr. Beverely Low Specialist [facility] Follow-up appointment confirmed?: NA Do you need transportation to your follow-up appointment?: No (pt confirms she is able to drive herself to appts) Do you understand care options if your condition(s) worsen?: Yes-patient verbalized understanding  SDOH Interventions Today    Flowsheet Row Most Recent Value  SDOH Interventions   Food Insecurity Interventions Intervention Not Indicated  Housing Interventions Intervention Not Indicated  Transportation Interventions Intervention Not Indicated  Utilities Interventions Intervention Not Indicated      TOC interventions discussed/reviewed: -Discussed/reviewed insurance/health plans benefits -Doctor visit discussed/reviewed -PCP -Doctor visits discussed/reviewed-Specialist -Provided Verbal Education: 30-day TOC program, nutrition, meds & their functions, symptom mgmt., fall/safety measures in the home -Disease mgmt. discussed/reviewed Antionette Fairy, RN, BSN, CCM RN Care Manager [place]-Transitions of Care [address] [place], [place] [address] [phone]

## 2023-03-13 LAB — TYPE AND SCREEN
ABO/RH(D): O POS
Antibody Screen: NEGATIVE
Unit division: 0

## 2023-03-13 LAB — BPAM RBC
Blood Product Expiration Date: 202412262359
Unit Type and Rh: 5100

## 2023-03-15 ENCOUNTER — Telehealth: Payer: Self-pay

## 2023-03-15 ENCOUNTER — Encounter: Payer: Self-pay | Admitting: Family Medicine

## 2023-03-15 ENCOUNTER — Ambulatory Visit: Payer: Medicare Other | Admitting: Family Medicine

## 2023-03-15 VITALS — BP 100/68 | HR 83 | Temp 97.8°F | Ht 64.0 in | Wt 173.4 lb

## 2023-03-15 DIAGNOSIS — K922 Gastrointestinal hemorrhage, unspecified: Secondary | ICD-10-CM | POA: Diagnosis not present

## 2023-03-15 DIAGNOSIS — D62 Acute posthemorrhagic anemia: Secondary | ICD-10-CM | POA: Diagnosis not present

## 2023-03-15 LAB — CBC WITH DIFFERENTIAL/PLATELET
Basophils Absolute: 0.1 10*3/uL (ref 0.0–0.1)
Basophils Relative: 0.7 % (ref 0.0–3.0)
Eosinophils Absolute: 0.2 10*3/uL (ref 0.0–0.7)
Eosinophils Relative: 2.6 % (ref 0.0–5.0)
HCT: 26.9 % — ABNORMAL LOW (ref 36.0–46.0)
Hemoglobin: 9 g/dL — ABNORMAL LOW (ref 12.0–15.0)
Lymphocytes Relative: 25.6 % (ref 12.0–46.0)
Lymphs Abs: 1.9 10*3/uL (ref 0.7–4.0)
MCHC: 33.4 g/dL (ref 30.0–36.0)
MCV: 95.4 fL (ref 78.0–100.0)
Monocytes Absolute: 0.4 10*3/uL (ref 0.1–1.0)
Monocytes Relative: 6 % (ref 3.0–12.0)
Neutro Abs: 4.8 10*3/uL (ref 1.4–7.7)
Neutrophils Relative %: 65.1 % (ref 43.0–77.0)
Platelets: 268 10*3/uL (ref 150.0–400.0)
RBC: 2.82 Mil/uL — ABNORMAL LOW (ref 3.87–5.11)
RDW: 14.8 % (ref 11.5–15.5)
WBC: 7.3 10*3/uL (ref 4.0–10.5)

## 2023-03-15 NOTE — Telephone Encounter (Signed)
Left detailed vm °

## 2023-03-15 NOTE — Telephone Encounter (Signed)
No- her blood pressure in the office was low.  She should stay off her Amlodipine

## 2023-03-15 NOTE — Progress Notes (Unsigned)
   Subjective:    Patient ID: Tracey Evans, female    DOB: October 21, 1945, 77 y.o.   MRN: [id number]  HPI Hospital f/u- pt was admitted 11/30-12/2 for GI bleed.  Bleeding was brisk and painless.  She had an episode after a bloody BM in which she became bradycardic and hypotensive.  This resolved spontaneously and deemed to be vasoagal.  Hgb went from 12 --> 9.8 but stabilized.  Meloxicam was stopped.  Pt is now having joint pain.  Is asking if she can take Osteo Bi flex  No dizziness, CP, SOB, visual changes.  No edema.  Pt reports feeling well.   Review of Systems For ROS see HPI     Objective:   Physical Exam Vitals reviewed.  Constitutional:      General: She is not in acute distress.    Appearance: Normal appearance. She is well-developed. She is not ill-appearing.  HENT:     Head: Normocephalic and atraumatic.  Eyes:     Conjunctiva/sclera: Conjunctivae normal.     Pupils: Pupils are equal, round, and reactive to light.  Neck:     Thyroid: No thyromegaly.  Cardiovascular:     Rate and Rhythm: Normal rate and regular rhythm.     Pulses: Normal pulses.     Heart sounds: Normal heart sounds. No murmur heard. Pulmonary:     Effort: Pulmonary effort is normal. No respiratory distress.     Breath sounds: Normal breath sounds.  Abdominal:     General: There is no distension.     Palpations: Abdomen is soft.     Tenderness: There is no abdominal tenderness.  Musculoskeletal:     Cervical back: Normal range of motion and neck supple.     Right lower leg: No edema.     Left lower leg: No edema.  Lymphadenopathy:     Cervical: No cervical adenopathy.  Skin:    General: Skin is warm and dry.  Neurological:     General: No focal deficit present.     Mental Status: She is alert and oriented to person, place, and time.  Psychiatric:        Mood and Affect: Mood normal.        Behavior: Behavior normal.        Thought Content: Thought content normal.            Assessment & Plan:

## 2023-03-15 NOTE — Telephone Encounter (Signed)
-----   Message from Neena Rhymes sent at 03/15/2023 12:47 PM EST ----- Your hemoglobin (blood count) is lower than it was 4 days ago.  Please make sure you are taking an OTC iron supplement daily to improve this number.

## 2023-03-15 NOTE — Patient Instructions (Signed)
Follow up in 1 month to recheck sugar and blood pressure We'll notify you of your lab results and make any changes if needed Drink LOTS of water to help keep your blood pressure up ADD some salt to your veggies- this will also keep your blood pressure up Make sure you are eating regularly Call with any questions or concerns Stay Safe!  Stay Healthy! Happy Holidays!!

## 2023-03-17 NOTE — Assessment & Plan Note (Signed)
No new bleeding since D/C.  Pt has stopped her Meloxicam as recommended.  She wants to start Osteo Biflex- told her this would be fine.

## 2023-03-17 NOTE — Assessment & Plan Note (Signed)
Pt had diverticular bleed on 11/30 and was admitted to hospital.  She had a few more bloody BM's w/ a vasovagal episode but Hgb stabilized at 9.8 prior to d/c.  She reports she had a BM w/ large amounts of old blood and clots since being home.  Will repeat CBC to ensure Hgb remains stable.  Encouraged daily MVI w/ iron.

## 2023-03-20 ENCOUNTER — Other Ambulatory Visit: Payer: Self-pay

## 2023-03-20 ENCOUNTER — Emergency Department (HOSPITAL_COMMUNITY)
Admission: EM | Admit: 2023-03-20 | Discharge: 2023-03-20 | Disposition: A | Discharge status: Home / Self Care | Payer: Medicare Other | Attending: Emergency Medicine | Admitting: Emergency Medicine

## 2023-03-20 ENCOUNTER — Encounter (HOSPITAL_COMMUNITY): Payer: Self-pay

## 2023-03-20 DIAGNOSIS — Z91040 Latex allergy status: Secondary | ICD-10-CM | POA: Diagnosis not present

## 2023-03-20 DIAGNOSIS — E119 Type 2 diabetes mellitus without complications: Secondary | ICD-10-CM | POA: Insufficient documentation

## 2023-03-20 DIAGNOSIS — Z853 Personal history of malignant neoplasm of breast: Secondary | ICD-10-CM | POA: Diagnosis not present

## 2023-03-20 DIAGNOSIS — J45909 Unspecified asthma, uncomplicated: Secondary | ICD-10-CM | POA: Insufficient documentation

## 2023-03-20 DIAGNOSIS — I10 Essential (primary) hypertension: Secondary | ICD-10-CM | POA: Diagnosis not present

## 2023-03-20 DIAGNOSIS — E039 Hypothyroidism, unspecified: Secondary | ICD-10-CM | POA: Insufficient documentation

## 2023-03-20 DIAGNOSIS — Z7982 Long term (current) use of aspirin: Secondary | ICD-10-CM | POA: Insufficient documentation

## 2023-03-20 DIAGNOSIS — H209 Unspecified iridocyclitis: Secondary | ICD-10-CM | POA: Insufficient documentation

## 2023-03-20 DIAGNOSIS — H5712 Ocular pain, left eye: Secondary | ICD-10-CM | POA: Diagnosis present

## 2023-03-20 MED ORDER — PREDNISOLONE ACETATE 1 % OP SUSP
1.0000 [drp] | Freq: Once | OPHTHALMIC | Status: AC
  Administered 2023-03-20: 1 [drp] via OPHTHALMIC
  Filled 2023-03-20: qty 5

## 2023-03-20 MED ORDER — TETRACAINE HCL 0.5 % OP SOLN
1.0000 [drp] | Freq: Once | OPHTHALMIC | Status: AC
  Administered 2023-03-20: 1 [drp] via OPHTHALMIC
  Filled 2023-03-20: qty 4

## 2023-03-20 MED ORDER — PREDNISOLONE ACETATE 1 % OP SUSP: 1.0000 [drp] | Freq: Every day | OPHTHALMIC | 0 refills | Status: DC

## 2023-03-20 MED ORDER — FLUORESCEIN SODIUM 1 MG OP STRP
1.0000 | ORAL_STRIP | Freq: Once | OPHTHALMIC | Status: AC
  Administered 2023-03-20: 1 via OPHTHALMIC
  Filled 2023-03-20: qty 1

## 2023-03-20 NOTE — Discharge Instructions (Signed)
We evaluated you for your eye pain.  Your symptoms are consistent with iritis.  We have prescribed you a steroid eyedrop.  Please use this 6 times daily.  We discussed your symptoms with the on-call ophthalmologist Dr. Jenene Slicker.  She would like to see you Friday at 1 PM.  Please follow-up with her to make sure your symptoms are improving.  Please return to the emergency department for any new or worsening symptoms such as loss of vision or vision changes, worsening pain, fevers or chills, or any other concerning symptoms.

## 2023-03-20 NOTE — ED Notes (Signed)
Visual Acuity Screening  Left eye: 20/400  Right eye: 20/400  Both eyes: 20/4000

## 2023-03-20 NOTE — ED Triage Notes (Signed)
Pt arrives via POV. Pt reports redness and pain to left eye since yesterday. Reports it has been watery as well. No injury, denies possibility of foreign body. States she gets iritis about once a year. Pt AxOx4. NAD.

## 2023-03-20 NOTE — ED Provider Notes (Signed)
[place] EMERGENCY DEPARTMENT AT [facility] Provider Note  CSN: [id number] Arrival date & time: 03/20/23 7829  Chief Complaint(s) Eye Problem  HPI Tracey Evans is a 78 y.o. female history of diabetes, hyperlipidemia, hypertension presenting to the emergency department with eye pain.  Patient reports left eye pain since yesterday.  Feels similar to previous episodes of iritis.  She reports sensitivity to light.  No eye trauma.  No visual disturbances.  No fevers or chills.  She reports associated redness and tearing.   Past Medical History Past Medical History:  Diagnosis Date   Angina at rest [facility])    "occurs whenever it wants to, but worse during agitation"   Anxiety    Asthma    Binge eating disorder    Breast cancer of lower-outer quadrant of left female breast (HCC) 07/08/2015   "NEVER HAD LEFT BREAST CANCER" (08/15/2015)   Cancer of right breast (HCC) 08/15/2015   Chronic bronchitis (HCC)    Chronic lower back pain    Complication of anesthesia    had bronc spasms during intubation surgery 2009 on foot-need albuterol inhaler or neb tx preop   Costochondritis    Depression    Diverticular disease    11-2020 bleed   Fibromyalgia    GERD (gastroesophageal reflux disease)    Hot flashes    Hyperlipidemia    Hypertension    Hypothyroidism    Osteoarthritis    "all over"   Peptic ulcer disease    Personal history of chemotherapy 2017   Shortness of breath dyspnea    Type II diabetes mellitus (HCC)    "diet controlled" (08/15/2015)   Patient Active Problem List   Diagnosis Date Noted   Acute blood loss anemia 03/10/2023   Acute GI bleeding 03/09/2023   Trauma in childhood 12/27/2021   Pruritus 05/23/2021   Other allergic rhinitis 05/23/2021   History of urticaria 05/23/2021   Other adverse food reactions, not elsewhere classified, subsequent encounter 05/23/2021   History of GI diverticular bleed 12/28/2020   Hypomagnesemia 11/02/2020   Lumbar  facet arthropathy 04/13/2020   Myalgia 04/13/2020   Personal history of breast cancer 05/07/2019   Recurrent falls 10/14/2018   Hypotension 10/05/2018   Cervical disc disease 05/02/2017   Anemia associated with nutritional deficiency 01/13/2016   Vasovagal episode 10/05/2015   Genetic testing 08/21/2015   Family history of breast cancer in female 07/15/2015   Family history of colon cancer 07/15/2015   Breast cancer of lower-outer quadrant of right female breast (HCC) 07/08/2015   Disturbance in sleep behavior 11/18/2014   Fibromyalgia 10/04/2014   Diverticulitis of colon (without mention of hemorrhage)(562.11) 12/01/2013   Dry mouth 04/29/2013   Dry eye 04/29/2013   Edema 09/04/2012   Depression 09/04/2012   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   OAB (overactive bladder) 04/01/2012   DM w/o complication type II (HCC) 02/06/2012   Allergy to dogs 04/19/2011   Polyarthralgia 09/18/2010   Obesity 07/25/2010   Diverticulosis of colon with hemorrhage 06/02/2010   STRESS INCONTINENCE 04/25/2010   Other asthma 04/11/2010   VERTIGO 04/11/2010   Hypothyroidism 01/19/2010   Hyperlipidemia associated with type 2 diabetes mellitus (HCC) 01/19/2010   Essential hypertension 01/19/2010   GERD 01/19/2010   Peptic ulcer 01/19/2010   OSTEOARTHRITIS 01/19/2010   LOW BACK PAIN 01/19/2010   Home Medication(s) Prior to Admission medications   Medication Sig Start Date End Date Taking? Authorizing Provider  prednisoLONE acetate (PRED FORTE) 1 % ophthalmic suspension  Place 1 drop into the left eye 6 (six) times daily. 03/20/23  Yes Lonell Grandchild, MD  aspirin 81 MG tablet Take 81 mg by mouth daily.    [provider]  atorvastatin (LIPITOR) 20 MG tablet Take 1 tablet (20 mg total) by mouth daily. 10/19/22   Sheliah Hatch, MD  buPROPion (WELLBUTRIN XL) 300 MG 24 hr tablet Take 1 tablet (300 mg total) by mouth daily. 10/19/22   Sheliah Hatch, MD   gabapentin (NEURONTIN) 300 MG capsule TAKE 1 CAPSULE IN THE MORNING , 1 IN AFTERNOON AND 2 CAPSULES BEFORE BED Patient taking differently: Take 300-600 mg by mouth 3 (three) times daily. TAKE 1 CAPSULE IN THE MORNING , 1 IN AFTERNOON AND 2 CAPSULES BEFORE BED 09/19/22   Sheliah Hatch, MD  Glycerin-Polysorbate 80 (REFRESH DRY EYE THERAPY OP) Place 1 drop into both eyes 3 (three) times daily.    [provider]  levothyroxine (SYNTHROID) 75 MCG tablet TAKE 1 TABLET BY MOUTH EVERY DAY BEFORE BREAKFAST 03/04/23   Sheliah Hatch, MD  Magnesium 400 MG CAPS Take by mouth 2 (two) times daily.    [provider]  omeprazole (PRILOSEC) 20 MG capsule TAKE 1 CAPSULE BY MOUTH EVERY DAY Patient taking differently: Take 20 mg by mouth daily. 09/10/22   Sheliah Hatch, MD  polyethylene glycol powder (MIRALAX) 17 GM/SCOOP powder Take 17 g by mouth 2 (two) times daily as needed for mild constipation. 10/03/22   Almon Hercules, MD  traZODone (DESYREL) 50 MG tablet Take 50 mg by mouth at bedtime. 03/25/22   [provider]                                                                                                                                    Past Surgical History Past Surgical History:  Procedure Laterality Date   ABDOMINAL HYSTERECTOMY     "left my ovaries"   ANKLE ARTHROSCOPY  01/02/2012   Procedure: ANKLE ARTHROSCOPY;  Surgeon: Sherri Rad, MD;  Location: [facility];  Service: Orthopedics;  Laterality: Right;  right ankle arthroscopy with extensive debridement , dridement and drilling talar dome osteochondral lesion   ANKLE FRACTURE SURGERY Right 2009   Dr. Shon Baton   BREAST BIOPSY  07/2015   CARDIAC CATHETERIZATION     CARPAL TUNNEL RELEASE Bilateral    CATARACT EXTRACTION, BILATERAL     COLONOSCOPY     DILATION AND CURETTAGE OF UTERUS     "before hysterectomy"   FRACTURE SURGERY     KNEE ARTHROSCOPY Bilateral    LAPAROSCOPIC CHOLECYSTECTOMY      MASTECTOMY COMPLETE / SIMPLE W/ SENTINEL NODE BIOPSY Right 08/15/2015   MASTECTOMY W/ SENTINEL NODE BIOPSY Right 08/15/2015   Procedure: TOTAL MASTECTOMY WITH SENTINEL LYMPH NODE BIOPSY AND BLUE DYE INJECTION ;  Surgeon: Claud Kelp, MD;  Location: [facility] OR;  Service: General;  Laterality: Right;  PORT-A-CATH REMOVAL N/A 11/12/2016   Procedure: REMOVAL PORT-A-CATH;  Surgeon: Avel Peace, MD;  Location: [facility];  Service: General;  Laterality: N/A;   PORTACATH PLACEMENT Left 08/15/2015   PORTACATH PLACEMENT Left 08/15/2015   Procedure: INSERTION PORT-A-CATH ;  Surgeon: Claud Kelp, MD;  Location: [facility] OR;  Service: General;  Laterality: Left;   SHOULDER ARTHROSCOPY Bilateral 2009-2011   "bone spurs"   TONSILLECTOMY     Family History Family History  Problem Relation Age of Onset   Colon polyps Mother    Dementia Mother    Stroke Mother    Heart Problems Mother    Dementia Father    Alcohol abuse Father    Colon cancer Father        dx. 74 or younger-pt states never confirmed   Breast cancer Sister 37       inflammatory   Diverticulitis Sister        maternal half-sister; severe - causing partial colectomy   Breast cancer Sister        maternal half-sister; dx. early 25s   Alcohol abuse Brother    Colon polyps Brother    Breast cancer Other 88       niece   Breast cancer Other        niece dx. 50s   Esophageal cancer Neg Hx    Stomach cancer Neg Hx    Rectal cancer Neg Hx     Social History Social History   Tobacco Use   Smoking status: Never   Smokeless tobacco: Never  Vaping Use   Vaping status: Never Used  Substance Use Topics   Alcohol use: Not Currently    Alcohol/week: 0.0 standard drinks of alcohol    Comment: 08/15/2015 "1/2 glass of wine q 2 months"    Drug use: No   Allergies Contrast media [iodinated contrast media], Dilaudid [hydromorphone hcl], Adhesive [tape], Codeine, Cymbalta [duloxetine hcl], Lactose intolerance (gi), Latex,  Nsaids, and Lyrica [pregabalin]  Review of Systems Review of Systems  All other systems reviewed and are negative.   Physical Exam Vital Signs  I have reviewed the triage vital signs BP (!) 141/79 (BP Location: Left Arm)   Pulse 85   Temp 98.4 F (36.9 C) (Oral)   Resp 16   Ht 5\' 4"  (1.626 m)   Wt 78.5 kg   SpO2 100%   BMI 29.70 kg/m  Physical Exam Vitals and nursing note reviewed.  Constitutional:      Appearance: Normal appearance.  HENT:     Head: Normocephalic and atraumatic.     Mouth/Throat:     Mouth: Mucous membranes are moist.  Eyes:     Comments: Pupils equal round and reactive to light.  Extraocular movements intact.  Left eye with conjunctival injection.  Intraocular pressure 18 on right, 14 on the left.  No fluorescein uptake on fluorescein exam.  Visual acuity reviewed in chart.  Cardiovascular:     Rate and Rhythm: Normal rate.  Pulmonary:     Effort: Pulmonary effort is normal. No respiratory distress.  Abdominal:     General: Abdomen is flat.  Musculoskeletal:        General: No deformity.  Skin:    General: Skin is warm and dry.     Capillary Refill: Capillary refill takes less than 2 seconds.  Neurological:     General: No focal deficit present.     Mental Status: She is alert. Mental status is at baseline.  Psychiatric:  Mood and Affect: Mood normal.        Behavior: Behavior normal.     ED Results and Treatments Labs (all labs ordered are listed, but only abnormal results are displayed) Labs Reviewed - No data to display                                                                                                                        Radiology No results found.  Pertinent labs & imaging results that were available during my care of the patient were reviewed by me and considered in my medical decision making (see MDM for details).  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  Symptoms seem consistent with iritis.  Visual acuity is equal bilaterally.  She has no sign of any corneal abrasion or corneal ulcer.  Her ocular pressure is reassuring without signs of glaucoma.  She has had previous episodes which are the same.  She has consensual photophobia which is also suggestive of this.  Discussed with Dr. Jenene Slicker ophthalmology who recommends Pred forte and will follow-up with the patient in clinic on Friday. Will discharge patient to home. All questions answered. Patient comfortable with plan of discharge. Return precautions discussed with patient and specified on the after visit summary.       Medicines ordered and prescription drug management: Meds ordered this encounter  Medications   tetracaine (PONTOCAINE) 0.5 % ophthalmic solution 1 drop   fluorescein ophthalmic strip 1 strip   prednisoLONE acetate (PRED FORTE) 1 % ophthalmic suspension    Sig: Place 1 drop into the left eye 6 (six) times daily.    Dispense:  5 mL    Refill:  0    -I have reviewed the patients home medicines and have made adjustments as needed   Consultations Obtained: I requested consultation with the ophthalmologist ,  and discussed lab and imaging findings as well as pertinent plan - they recommend: outpatient follow up    Social Determinants of Health:  Diagnosis or treatment significantly limited by social determinants of health: obesity   Reevaluation: After the interventions noted above, I reevaluated the patient  and found that their symptoms have improved  Co morbidities that complicate the patient evaluation  Past Medical History:  Diagnosis Date   Angina at rest [facility])    "occurs whenever it wants to, but worse during agitation"   Anxiety    Asthma    Binge eating disorder    Breast cancer of lower-outer quadrant of left female breast (HCC) 07/08/2015   "NEVER HAD LEFT BREAST CANCER" (08/15/2015)   Cancer of right breast (HCC) 08/15/2015   Chronic bronchitis (HCC)    Chronic lower back pain    Complication of anesthesia    had bronc spasms during intubation surgery 2009 on foot-need albuterol inhaler or neb tx preop   Costochondritis    Depression    Diverticular disease    11-2020 bleed   Fibromyalgia    GERD (gastroesophageal reflux disease)    Hot flashes    Hyperlipidemia    Hypertension    Hypothyroidism    Osteoarthritis    "all over"   Peptic ulcer disease    Personal history of chemotherapy 2017   Shortness of breath dyspnea    Type II diabetes mellitus (HCC)    "diet controlled" (08/15/2015)      Dispostion: Disposition decision including need for hospitalization was considered, and patient discharged from emergency department.    Final Clinical Impression(s) / ED Diagnoses Final diagnoses:  Iritis     This chart was dictated using voice recognition software.  Despite best efforts to proofread,  errors can occur which can change the documentation meaning.    Lonell Grandchild, MD 03/20/23 1034

## 2023-03-22 DIAGNOSIS — H209 Unspecified iridocyclitis: Secondary | ICD-10-CM | POA: Diagnosis not present

## 2023-03-22 DIAGNOSIS — H43811 Vitreous degeneration, right eye: Secondary | ICD-10-CM | POA: Diagnosis not present

## 2023-03-28 ENCOUNTER — Telehealth: Payer: Self-pay

## 2023-03-28 NOTE — Telephone Encounter (Signed)
Received call from [employer] regarding need for order signed for heavy weight silicone prosthetic breast. Advised them of our direct fax.   Received fax from [facility] [employer]. Order signed by MD and faxed back. Fax confirmation received.

## 2023-04-04 NOTE — Telephone Encounter (Signed)
error 

## 2023-04-12 ENCOUNTER — Encounter: Payer: Self-pay | Admitting: Family Medicine

## 2023-04-12 ENCOUNTER — Ambulatory Visit (INDEPENDENT_AMBULATORY_CARE_PROVIDER_SITE_OTHER): Payer: Medicare Other | Admitting: Family Medicine

## 2023-04-12 VITALS — BP 108/72 | HR 78 | Temp 98.0°F | Wt 174.4 lb

## 2023-04-12 DIAGNOSIS — Z23 Encounter for immunization: Secondary | ICD-10-CM | POA: Diagnosis not present

## 2023-04-12 DIAGNOSIS — I10 Essential (primary) hypertension: Secondary | ICD-10-CM | POA: Diagnosis not present

## 2023-04-12 DIAGNOSIS — E1169 Type 2 diabetes mellitus with other specified complication: Secondary | ICD-10-CM

## 2023-04-12 DIAGNOSIS — E785 Hyperlipidemia, unspecified: Secondary | ICD-10-CM | POA: Diagnosis not present

## 2023-04-12 DIAGNOSIS — E119 Type 2 diabetes mellitus without complications: Secondary | ICD-10-CM

## 2023-04-12 LAB — BASIC METABOLIC PANEL
BUN: 13 mg/dL (ref 6–23)
CO2: 28 meq/L (ref 19–32)
Calcium: 9.3 mg/dL (ref 8.4–10.5)
Chloride: 107 meq/L (ref 96–112)
Creatinine, Ser: 1.02 mg/dL (ref 0.40–1.20)
GFR: 53.17 mL/min — ABNORMAL LOW (ref >60.00)
Glucose, Bld: 92 mg/dL (ref 70–99)
Potassium: 4.4 meq/L (ref 3.5–5.1)
Sodium: 142 meq/L (ref 135–145)

## 2023-04-12 LAB — CBC WITH DIFFERENTIAL/PLATELET
Basophils Absolute: 0 10*3/uL (ref 0.0–0.1)
Basophils Relative: 0.9 % (ref 0.0–3.0)
Eosinophils Absolute: 0.2 10*3/uL (ref 0.0–0.7)
Eosinophils Relative: 3.3 % (ref 0.0–5.0)
HCT: 33.6 % — ABNORMAL LOW (ref 36.0–46.0)
Hemoglobin: 10.6 g/dL — ABNORMAL LOW (ref 12.0–15.0)
Lymphocytes Relative: 36.1 % (ref 12.0–46.0)
Lymphs Abs: 1.7 10*3/uL (ref 0.7–4.0)
MCHC: 31.6 g/dL (ref 30.0–36.0)
MCV: 95 fL (ref 78.0–100.0)
Monocytes Absolute: 0.3 10*3/uL (ref 0.1–1.0)
Monocytes Relative: 7.1 % (ref 3.0–12.0)
Neutro Abs: 2.5 10*3/uL (ref 1.4–7.7)
Neutrophils Relative %: 52.6 % (ref 43.0–77.0)
Platelets: 327 10*3/uL (ref 150.0–400.0)
RBC: 3.53 Mil/uL — ABNORMAL LOW (ref 3.87–5.11)
RDW: 14.6 % (ref 11.5–15.5)
WBC: 4.7 10*3/uL (ref 4.0–10.5)

## 2023-04-12 LAB — LIPID PANEL
Cholesterol: 143 mg/dL (ref 0–200)
HDL: 56.8 mg/dL (ref >39.00)
LDL Cholesterol: 73 mg/dL (ref 0–99)
NonHDL: 86.35
Total CHOL/HDL Ratio: 3
Triglycerides: 65 mg/dL (ref 0.0–149.0)
VLDL: 13 mg/dL (ref 0.0–40.0)

## 2023-04-12 LAB — HEPATIC FUNCTION PANEL
ALT: 12 U/L (ref 0–35)
AST: 14 U/L (ref 0–37)
Albumin: 4.1 g/dL (ref 3.5–5.2)
Alkaline Phosphatase: 99 U/L (ref 39–117)
Bilirubin, Direct: 0 mg/dL (ref 0.0–0.3)
Total Bilirubin: 0.3 mg/dL (ref 0.2–1.2)
Total Protein: 6.4 g/dL (ref 6.0–8.3)

## 2023-04-12 LAB — HEMOGLOBIN A1C: Hgb A1c MFr Bld: 5.4 % (ref 4.6–6.5)

## 2023-04-12 LAB — TSH: TSH: 8.4 u[IU]/mL — ABNORMAL HIGH (ref 0.35–5.50)

## 2023-04-12 NOTE — Patient Instructions (Signed)
 Follow up in 6 months to recheck sugar, blood pressure, cholesterol We'll notify you of your lab results and make any changes if needed Keep up the good work on healthy diet and regular physical activity- shoulders back, walk tall! Call with any questions or concerns Stay Safe!  Stay Healthy! Happy New Year!

## 2023-04-12 NOTE — Assessment & Plan Note (Signed)
 Chronic problem.  Currently well controlled off of all medication.  Asymptomatic.  No need to restart meds at this time.

## 2023-04-12 NOTE — Assessment & Plan Note (Signed)
Chronic problem, on Lipitor 20mg daily w/o difficulty.  Check labs.  Adjust meds prn  

## 2023-04-12 NOTE — Assessment & Plan Note (Signed)
 Chronic problem.  Currently diet controlled.  UTD on microalbumin, eye exam scheduled.  Foot exam done today.  Asymptomatic w/ exception of known neuropathy.  Check labs.  Start meds prn.

## 2023-04-12 NOTE — Progress Notes (Signed)
   Subjective:    Patient ID: Tracey Evans, female    DOB: Aug 19, 1945, 78 y.o.   MRN: [id number]  HPI DM- chronic problem, currently diet controlled.  Eye exam scheduled.  Foot exam due.  UTD on microalbumin.  Last A1C 6.4%.  denies symptomatic lows.  Has known neuropathy.  HTN- chronic problem, off all medication due to BP running low.  No CP, SOB, HA's, visual changes, dizziness.  Hyperlipidemia- chronic problem, on Lipitor 20mg  daily.  No abd pain, N/V.  Review of Systems For ROS see HPI     Objective:   Physical Exam Vitals reviewed.  Constitutional:      General: She is not in acute distress.    Appearance: Normal appearance. She is well-developed. She is not ill-appearing.  HENT:     Head: Normocephalic and atraumatic.  Eyes:     Conjunctiva/sclera: Conjunctivae normal.     Pupils: Pupils are equal, round, and reactive to light.  Neck:     Thyroid : No thyromegaly.  Cardiovascular:     Rate and Rhythm: Normal rate and regular rhythm.     Pulses: Normal pulses.     Heart sounds: Normal heart sounds. No murmur heard. Pulmonary:     Effort: Pulmonary effort is normal. No respiratory distress.     Breath sounds: Normal breath sounds.  Abdominal:     General: There is no distension.     Palpations: Abdomen is soft.     Tenderness: There is no abdominal tenderness.  Musculoskeletal:     Cervical back: Normal range of motion and neck supple.     Right lower leg: No edema.     Left lower leg: No edema.  Lymphadenopathy:     Cervical: No cervical adenopathy.  Skin:    General: Skin is warm and dry.  Neurological:     General: No focal deficit present.     Mental Status: She is alert and oriented to person, place, and time.  Psychiatric:        Mood and Affect: Mood normal.        Behavior: Behavior normal.        Thought Content: Thought content normal.           Assessment & Plan:

## 2023-04-15 ENCOUNTER — Telehealth: Payer: Self-pay

## 2023-04-15 ENCOUNTER — Other Ambulatory Visit: Payer: Self-pay | Admitting: Family Medicine

## 2023-04-15 DIAGNOSIS — M797 Fibromyalgia: Secondary | ICD-10-CM

## 2023-04-15 NOTE — Telephone Encounter (Signed)
-----   Message from Comer Greet sent at 04/15/2023  9:59 AM EST ----- Your hemoglobin looks MUCH better!  This is probably why you're feeling better!  Your TSH level is too high- meaning that your thyroid  medication dose is too low.  We are going to increase your Levothyroxine  to 88mcg daily (#30, 3 refills) and repeat your TSH level at a lab only visit in 1 month (dx hypothyroid).  Please take your levothyroxine  on an empty stomach- 30 minutes before other meds/food  Your GFR (marker of kidney function) is just a little low.  This will improve w/ better water intake.

## 2023-04-15 NOTE — Telephone Encounter (Signed)
 Copied from CRM [id number]. Topic: Clinical - Prescription Issue >> Apr 15, 2023 11:18 AM Graeme ORN wrote: Reason for CRM: Patient called states she received message from provider about increasing the dose for medication. Pt states she does not know about increasing it at the moment because she hasn't been taking it like she should. Thinks she should just start taking the medication as prescribed before increasing to see how that works. Can reach out to patient with any questions or concerns. Thank you.

## 2023-04-15 NOTE — Telephone Encounter (Signed)
 Pt can keep current dose of Levothyroxine and start taking it regularly.  She should take on an empty stomach- 30 minutes prior to eating/medication

## 2023-04-15 NOTE — Telephone Encounter (Signed)
 Pt notes she will begin taking regularly notes she missed a few doses with the holiday

## 2023-04-15 NOTE — Telephone Encounter (Signed)
 Pt called and refused new medication dosage Pt has reviewed via mychart

## 2023-04-18 ENCOUNTER — Other Ambulatory Visit: Payer: Self-pay | Admitting: Family Medicine

## 2023-04-29 DIAGNOSIS — E782 Mixed hyperlipidemia: Secondary | ICD-10-CM | POA: Diagnosis not present

## 2023-04-29 DIAGNOSIS — I10 Essential (primary) hypertension: Secondary | ICD-10-CM | POA: Diagnosis not present

## 2023-04-29 DIAGNOSIS — I2510 Atherosclerotic heart disease of native coronary artery without angina pectoris: Secondary | ICD-10-CM | POA: Diagnosis not present

## 2023-04-29 DIAGNOSIS — E039 Hypothyroidism, unspecified: Secondary | ICD-10-CM | POA: Diagnosis not present

## 2023-05-03 DIAGNOSIS — H33322 Round hole, left eye: Secondary | ICD-10-CM | POA: Diagnosis not present

## 2023-05-03 DIAGNOSIS — H209 Unspecified iridocyclitis: Secondary | ICD-10-CM | POA: Diagnosis not present

## 2023-05-13 ENCOUNTER — Emergency Department (HOSPITAL_BASED_OUTPATIENT_CLINIC_OR_DEPARTMENT_OTHER)
Admission: EM | Admit: 2023-05-13 | Discharge: 2023-05-14 | Disposition: A | Discharge status: Home / Self Care | Payer: Medicare Other | Attending: Emergency Medicine | Admitting: Emergency Medicine

## 2023-05-13 ENCOUNTER — Encounter (HOSPITAL_BASED_OUTPATIENT_CLINIC_OR_DEPARTMENT_OTHER): Payer: Self-pay | Admitting: *Deleted

## 2023-05-13 ENCOUNTER — Other Ambulatory Visit: Payer: Self-pay

## 2023-05-13 DIAGNOSIS — Z79899 Other long term (current) drug therapy: Secondary | ICD-10-CM | POA: Diagnosis not present

## 2023-05-13 DIAGNOSIS — R112 Nausea with vomiting, unspecified: Secondary | ICD-10-CM | POA: Diagnosis not present

## 2023-05-13 DIAGNOSIS — R197 Diarrhea, unspecified: Secondary | ICD-10-CM | POA: Insufficient documentation

## 2023-05-13 DIAGNOSIS — J45909 Unspecified asthma, uncomplicated: Secondary | ICD-10-CM | POA: Diagnosis not present

## 2023-05-13 DIAGNOSIS — E039 Hypothyroidism, unspecified: Secondary | ICD-10-CM | POA: Diagnosis not present

## 2023-05-13 DIAGNOSIS — I10 Essential (primary) hypertension: Secondary | ICD-10-CM | POA: Diagnosis not present

## 2023-05-13 DIAGNOSIS — Z20822 Contact with and (suspected) exposure to covid-19: Secondary | ICD-10-CM | POA: Insufficient documentation

## 2023-05-13 DIAGNOSIS — N3000 Acute cystitis without hematuria: Secondary | ICD-10-CM | POA: Diagnosis not present

## 2023-05-13 DIAGNOSIS — Z7982 Long term (current) use of aspirin: Secondary | ICD-10-CM | POA: Diagnosis not present

## 2023-05-13 DIAGNOSIS — E119 Type 2 diabetes mellitus without complications: Secondary | ICD-10-CM | POA: Insufficient documentation

## 2023-05-13 DIAGNOSIS — Z853 Personal history of malignant neoplasm of breast: Secondary | ICD-10-CM | POA: Diagnosis not present

## 2023-05-13 DIAGNOSIS — R1110 Vomiting, unspecified: Secondary | ICD-10-CM | POA: Diagnosis present

## 2023-05-13 LAB — CBC
HCT: 40 % (ref 36.0–46.0)
Hemoglobin: 12.4 g/dL (ref 12.0–15.0)
MCH: 27.4 pg (ref 26.0–34.0)
MCHC: 31 g/dL (ref 30.0–36.0)
MCV: 88.3 fL (ref 80.0–100.0)
Platelets: 354 10*3/uL (ref 150–400)
RBC: 4.53 MIL/uL (ref 3.87–5.11)
RDW: 14.1 % (ref 11.5–15.5)
WBC: 6.7 10*3/uL (ref 4.0–10.5)
nRBC: 0 % (ref 0.0–0.2)

## 2023-05-13 LAB — COMPREHENSIVE METABOLIC PANEL
ALT: 13 U/L (ref 0–44)
AST: 16 U/L (ref 15–41)
Albumin: 4.7 g/dL (ref 3.5–5.0)
Alkaline Phosphatase: 101 U/L (ref 38–126)
Anion gap: 12 (ref 5–15)
BUN: 15 mg/dL (ref 8–23)
CO2: 27 mmol/L (ref 22–32)
Calcium: 9.3 mg/dL (ref 8.9–10.3)
Chloride: 102 mmol/L (ref 98–111)
Creatinine, Ser: 0.98 mg/dL (ref 0.44–1.00)
GFR, Estimated: 59 mL/min — ABNORMAL LOW (ref >60)
Glucose, Bld: 155 mg/dL — ABNORMAL HIGH (ref 70–99)
Potassium: 3.6 mmol/L (ref 3.5–5.1)
Sodium: 141 mmol/L (ref 135–145)
Total Bilirubin: 0.7 mg/dL (ref 0.0–1.2)
Total Protein: 7.8 g/dL (ref 6.5–8.1)

## 2023-05-13 LAB — RESP PANEL BY RT-PCR (RSV, FLU A&B, COVID)  RVPGX2
Influenza A by PCR: NEGATIVE
Influenza B by PCR: NEGATIVE
Resp Syncytial Virus by PCR: NEGATIVE
SARS Coronavirus 2 by RT PCR: NEGATIVE

## 2023-05-13 LAB — LIPASE, BLOOD: Lipase: <10 U/L — ABNORMAL LOW (ref 11–51)

## 2023-05-13 MED ORDER — ONDANSETRON 4 MG PO TBDP
4.0000 mg | ORAL_TABLET | Freq: Once | ORAL | Status: AC | PRN
  Administered 2023-05-13: 4 mg via ORAL
  Filled 2023-05-13: qty 1

## 2023-05-13 MED ORDER — SODIUM CHLORIDE 0.9 % IV BOLUS
500.0000 mL | Freq: Once | INTRAVENOUS | Status: AC
  Administered 2023-05-13: 500 mL via INTRAVENOUS

## 2023-05-13 MED ORDER — ONDANSETRON HCL 4 MG/2ML IJ SOLN
4.0000 mg | Freq: Once | INTRAMUSCULAR | Status: AC
  Administered 2023-05-13: 4 mg via INTRAVENOUS
  Filled 2023-05-13: qty 2

## 2023-05-13 MED ORDER — SODIUM CHLORIDE 0.9 % IV BOLUS
1000.0000 mL | Freq: Once | INTRAVENOUS | Status: AC
  Administered 2023-05-13: 1000 mL via INTRAVENOUS

## 2023-05-13 MED ORDER — ACETAMINOPHEN 500 MG PO TABS
1000.0000 mg | ORAL_TABLET | Freq: Once | ORAL | Status: AC
  Administered 2023-05-13: 1000 mg via ORAL
  Filled 2023-05-13: qty 2

## 2023-05-13 MED ORDER — MORPHINE SULFATE (PF) 2 MG/ML IV SOLN
2.0000 mg | Freq: Once | INTRAVENOUS | Status: AC
  Administered 2023-05-13: 2 mg via INTRAVENOUS
  Filled 2023-05-13: qty 1

## 2023-05-13 NOTE — ED Triage Notes (Signed)
Pt has also been having diarrhea

## 2023-05-13 NOTE — ED Provider Notes (Signed)
EMERGENCY DEPARTMENT AT [facility] Provider Note  CSN: [id number] Arrival date & time: 05/13/23 2011  Chief Complaint(s) Emesis  HPI Tracey Evans is a 78 y.o. female with past medical history as below, significant for GERD, HLD, PUD, fibromyalgia, DM2 who presents to the ED with complaint of nausea vomiting diarrhea  Patient reports that she ate dinner at church on Sunday, since then she has been having profuse nausea and vomiting, diarrhea.  Intermittent abdominal cramping.  No fevers.  Having a headache that she gets at times when she vomits.  Headache was not maximal or sudden onset.  No associated neurologic deficits.  She has not tried any medication at home for symptoms prior to arrival.  No sick contacts recent travel.  No recent antibiotics or history of C. difficile  Past Medical History Past Medical History:  Diagnosis Date   Angina at rest [facility])    "occurs whenever it wants to, but worse during agitation"   Anxiety    Asthma    Binge eating disorder    Breast cancer of lower-outer quadrant of left female breast (HCC) 07/08/2015   "NEVER HAD LEFT BREAST CANCER" (08/15/2015)   Cancer of right breast (HCC) 08/15/2015   Chronic bronchitis (HCC)    Chronic lower back pain    Complication of anesthesia    had bronc spasms during intubation surgery 2009 on foot-need albuterol inhaler or neb tx preop   Costochondritis    Depression    Diverticular disease    11-2020 bleed   Fibromyalgia    GERD (gastroesophageal reflux disease)    Hot flashes    Hyperlipidemia    Hypertension    Hypothyroidism    Osteoarthritis    "all over"   Peptic ulcer disease    Personal history of chemotherapy 2017   Shortness of breath dyspnea    Type II diabetes mellitus (HCC)    "diet controlled" (08/15/2015)   Patient Active Problem List   Diagnosis Date Noted   Acute blood loss anemia 03/10/2023   Acute GI bleeding 03/09/2023   Trauma in childhood 12/27/2021    Pruritus 05/23/2021   Other allergic rhinitis 05/23/2021   History of urticaria 05/23/2021   Other adverse food reactions, not elsewhere classified, subsequent encounter 05/23/2021   History of GI diverticular bleed 12/28/2020   Hypomagnesemia 11/02/2020   Lumbar facet arthropathy 04/13/2020   Myalgia 04/13/2020   Personal history of breast cancer 05/07/2019   Recurrent falls 10/14/2018   Hypotension 10/05/2018   Cervical disc disease 05/02/2017   Anemia associated with nutritional deficiency 01/13/2016   Vasovagal episode 10/05/2015   Genetic testing 08/21/2015   Family history of breast cancer in female 07/15/2015   Family history of colon cancer 07/15/2015   Breast cancer of lower-outer quadrant of right female breast (HCC) 07/08/2015   Disturbance in sleep behavior 11/18/2014   Fibromyalgia 10/04/2014   Diverticulitis of colon (without mention of hemorrhage)(562.11) 12/01/2013   Dry mouth 04/29/2013   Dry eye 04/29/2013   Edema 09/04/2012   Depression 09/04/2012   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   OAB (overactive bladder) 04/01/2012   DM w/o complication type II (HCC) 02/06/2012   Allergy to dogs 04/19/2011   Polyarthralgia 09/18/2010   Obesity 07/25/2010   Diverticulosis of colon with hemorrhage 06/02/2010   STRESS INCONTINENCE 04/25/2010   Other asthma 04/11/2010   VERTIGO 04/11/2010   Hypothyroidism 01/19/2010   Hyperlipidemia associated with type 2 diabetes mellitus (HCC) 01/19/2010   Essential  hypertension 01/19/2010   GERD 01/19/2010   Peptic ulcer 01/19/2010   OSTEOARTHRITIS 01/19/2010   LOW BACK PAIN 01/19/2010   Home Medication(s) Prior to Admission medications   Medication Sig Start Date End Date Taking? Authorizing Provider  cephALEXin (KEFLEX) 500 MG capsule Take 1 capsule (500 mg total) by mouth 3 (three) times daily for 7 days. 05/14/23 05/21/23 Yes Tanda Rockers A, DO  ondansetron (ZOFRAN) 4 MG tablet Take 1 tablet (4 mg total)  by mouth every 8 (eight) hours as needed for nausea or vomiting. 05/14/23  Yes Tanda Rockers A, DO  sucralfate (CARAFATE) 1 g tablet Take 1 tablet (1 g total) by mouth with breakfast, with lunch, and with evening meal for 7 days. 05/14/23 05/21/23 Yes Tanda Rockers A, DO  aspirin 81 MG tablet Take 81 mg by mouth daily.    [provider]  atorvastatin (LIPITOR) 20 MG tablet Take 1 tablet (20 mg total) by mouth daily. 10/19/22   Sheliah Hatch, MD  buPROPion (WELLBUTRIN XL) 300 MG 24 hr tablet TAKE 1 TABLET BY MOUTH EVERY DAY 04/18/23   Sheliah Hatch, MD  gabapentin (NEURONTIN) 300 MG capsule TAKE 1 CAPSULE IN THE MORNING , 1 IN AFTERNOON AND 2 CAPSULES BEFORE BED 04/16/23   Sheliah Hatch, MD  Glycerin-Polysorbate 80 (REFRESH DRY EYE THERAPY OP) Place 1 drop into both eyes 3 (three) times daily.    [provider]  levothyroxine (SYNTHROID) 75 MCG tablet TAKE 1 TABLET BY MOUTH EVERY DAY BEFORE BREAKFAST 03/04/23   Sheliah Hatch, MD  Magnesium 400 MG CAPS Take by mouth 2 (two) times daily.    [provider]  omeprazole (PRILOSEC) 20 MG capsule TAKE 1 CAPSULE BY MOUTH EVERY DAY Patient taking differently: Take 20 mg by mouth daily. 09/10/22   Sheliah Hatch, MD  polyethylene glycol powder (MIRALAX) 17 GM/SCOOP powder Take 17 g by mouth 2 (two) times daily as needed for mild constipation. 10/03/22   Almon Hercules, MD  prednisoLONE acetate (PRED FORTE) 1 % ophthalmic suspension Place 1 drop into the left eye 6 (six) times daily. 03/20/23   Lonell Grandchild, MD  traZODone (DESYREL) 50 MG tablet Take 50 mg by mouth at bedtime. 03/25/22   [provider]                                                                                                                                    Past Surgical History Past Surgical History:  Procedure Laterality Date   ABDOMINAL HYSTERECTOMY     "left my ovaries"   ANKLE ARTHROSCOPY  01/02/2012   Procedure: ANKLE  ARTHROSCOPY;  Surgeon: Sherri Rad, MD;  Location: [facility];  Service: Orthopedics;  Laterality: Right;  right ankle arthroscopy with extensive debridement , dridement and drilling talar dome osteochondral lesion   ANKLE FRACTURE SURGERY Right 2009   Dr. Shon Baton  BREAST BIOPSY  07/2015   CARDIAC CATHETERIZATION     CARPAL TUNNEL RELEASE Bilateral    CATARACT EXTRACTION, BILATERAL     COLONOSCOPY     DILATION AND CURETTAGE OF UTERUS     "before hysterectomy"   FRACTURE SURGERY     KNEE ARTHROSCOPY Bilateral    LAPAROSCOPIC CHOLECYSTECTOMY     MASTECTOMY COMPLETE / SIMPLE W/ SENTINEL NODE BIOPSY Right 08/15/2015   MASTECTOMY W/ SENTINEL NODE BIOPSY Right 08/15/2015   Procedure: TOTAL MASTECTOMY WITH SENTINEL LYMPH NODE BIOPSY AND BLUE DYE INJECTION ;  Surgeon: Claud Kelp, MD;  Location: [facility] OR;  Service: General;  Laterality: Right;   PORT-A-CATH REMOVAL N/A 11/12/2016   Procedure: REMOVAL PORT-A-CATH;  Surgeon: Avel Peace, MD;  Location: [facility];  Service: General;  Laterality: N/A;   PORTACATH PLACEMENT Left 08/15/2015   PORTACATH PLACEMENT Left 08/15/2015   Procedure: INSERTION PORT-A-CATH ;  Surgeon: Claud Kelp, MD;  Location: [facility] OR;  Service: General;  Laterality: Left;   SHOULDER ARTHROSCOPY Bilateral 2009-2011   "bone spurs"   TONSILLECTOMY     Family History Family History  Problem Relation Age of Onset   Colon polyps Mother    Dementia Mother    Stroke Mother    Heart Problems Mother    Dementia Father    Alcohol abuse Father    Colon cancer Father        dx. 74 or younger-pt states never confirmed   Breast cancer Sister 71       inflammatory   Diverticulitis Sister        maternal half-sister; severe - causing partial colectomy   Breast cancer Sister        maternal half-sister; dx. early 26s   Alcohol abuse Brother    Colon polyps Brother    Breast cancer Other 62       niece   Breast cancer Other        niece dx. 50s    Esophageal cancer Neg Hx    Stomach cancer Neg Hx    Rectal cancer Neg Hx     Social History Social History   Tobacco Use   Smoking status: Never   Smokeless tobacco: Never  Vaping Use   Vaping status: Never Used  Substance Use Topics   Alcohol use: Not Currently    Alcohol/week: 0.0 standard drinks of alcohol    Comment: 08/15/2015 "1/2 glass of wine q 2 months"    Drug use: No   Allergies Contrast media [iodinated contrast media], Dilaudid [hydromorphone hcl], Adhesive [tape], Codeine, Cymbalta [duloxetine hcl], Lactose intolerance (gi), Latex, Nsaids, and Lyrica [pregabalin]  Review of Systems Review of Systems  Constitutional:  Negative for appetite change and fever.  Respiratory:  Negative for chest tightness and shortness of breath.   Cardiovascular:  Negative for chest pain.  Gastrointestinal:  Positive for diarrhea, nausea and vomiting.  Genitourinary:  Negative for difficulty urinating.  Neurological:  Positive for headaches. Negative for weakness and numbness.  All other systems reviewed and are negative.   Physical Exam Vital Signs  I have reviewed the triage vital signs BP 114/69   Pulse 96   Temp 99.7 F (37.6 C) (Oral)   Resp 18   SpO2 95%  Physical Exam Vitals and nursing note reviewed.  Constitutional:      General: She is not in acute distress.    Appearance: Normal appearance.  HENT:     Head: Normocephalic and atraumatic.  Right Ear: External ear normal.     Left Ear: External ear normal.     Nose: Nose normal.     Mouth/Throat:     Mouth: Mucous membranes are moist.  Eyes:     General: No scleral icterus.       Right eye: No discharge.        Left eye: No discharge.     Extraocular Movements: Extraocular movements intact.     Pupils: Pupils are equal, round, and reactive to light.  Cardiovascular:     Rate and Rhythm: Regular rhythm. Tachycardia present.     Pulses: Normal pulses.     Heart sounds: Normal heart sounds.  Pulmonary:      Effort: Pulmonary effort is normal. No respiratory distress.     Breath sounds: Normal breath sounds. No stridor.  Abdominal:     General: Abdomen is flat. There is no distension.     Palpations: Abdomen is soft.     Tenderness: There is no abdominal tenderness.  Musculoskeletal:     Cervical back: No rigidity.     Right lower leg: No edema.     Left lower leg: No edema.  Skin:    General: Skin is warm and dry.     Capillary Refill: Capillary refill takes less than 2 seconds.  Neurological:     Mental Status: She is alert and oriented to person, place, and time.     Cranial Nerves: Cranial nerves 2-12 are intact. No dysarthria.     Sensory: Sensation is intact.     Motor: Motor function is intact. No tremor.     Coordination: Coordination is intact.     Comments: Strength 5/5 to BLUE/BLLE, equal and symmetric    Psychiatric:        Mood and Affect: Mood normal.        Behavior: Behavior normal. Behavior is cooperative.     ED Results and Treatments Labs (all labs ordered are listed, but only abnormal results are displayed) Labs Reviewed  LIPASE, BLOOD - Abnormal; Notable for the following components:      Result Value   Lipase <10 (*)    All other components within normal limits  COMPREHENSIVE METABOLIC PANEL - Abnormal; Notable for the following components:   Glucose, Bld 155 (*)    GFR, Estimated 59 (*)    All other components within normal limits  URINALYSIS, ROUTINE W REFLEX MICROSCOPIC - Abnormal; Notable for the following components:   APPearance TURBID (*)    Specific Gravity, Urine 1.034 (*)    Glucose, UA 30 (*)    Hgb urine dipstick 1 (*)    Ketones, ur 10 (*)    Protein, ur 1 (*)    Leukocytes,Ua 500 (*)    Bacteria, UA RARE (*)    All other components within normal limits  RESP PANEL BY RT-PCR (RSV, FLU A&B, COVID)  RVPGX2  URINE CULTURE  CBC  Radiology No results found.  Pertinent labs & imaging results that were available during my care of the patient were reviewed by me and considered in my medical decision making (see MDM for details).  Medications Ordered in ED Medications  ondansetron (ZOFRAN-ODT) disintegrating tablet 4 mg (4 mg Oral Given 05/13/23 2041)  sodium chloride 0.9 % bolus 1,000 mL (0 mLs Intravenous Stopped 05/13/23 2355)  ondansetron (ZOFRAN) injection 4 mg (4 mg Intravenous Given 05/13/23 2250)  morphine (PF) 2 MG/ML injection 2 mg (2 mg Intravenous Given 05/13/23 2254)  acetaminophen (TYLENOL) tablet 1,000 mg (1,000 mg Oral Given 05/13/23 2330)  sodium chloride 0.9 % bolus 500 mL (0 mLs Intravenous Stopped 05/14/23 0002)  cefTRIAXone (ROCEPHIN) 1 g in sodium chloride 0.9 % 100 mL IVPB (1 g Intravenous New Bag/Given 05/14/23 0032)                                                                                                                                     Procedures Procedures  (including critical care time)  Medical Decision Making / ED Course    Medical Decision Making:    VENNELA JUTTE is a 78 y.o. female with past medical history as below, significant for GERD, HLD, PUD, fibromyalgia, DM2 who presents to the ED with complaint of nausea vomiting diarrhea. The complaint involves an extensive differential diagnosis and also carries with it a high risk of complications and morbidity.  Serious etiology was considered. Ddx includes but is not limited to: Differential diagnosis includes but is not exclusive to subarachnoid hemorrhage, meningitis, encephalitis, previous head trauma, cavernous venous thrombosis, muscle tension headache, glaucoma, temporal arteritis, migraine or migraine equivalent, enteritis, gastroenteritis, gastritis, foodborne illness, viral syndrome etc.   Complete initial physical exam performed, notably the patient was in no acute distress.    Reviewed and confirmed nursing  documentation for past medical history, family history, social history.  Vital signs reviewed.   Clinical Course as of 05/14/23 0151  Tue May 14, 2023  0147 Po w/o difficulty, feeling better [SG]    Clinical Course User Index [SG] Sloan Leiter, DO    Brief summary: 78 year old female nausea vomiting diarrhea, abdominal cramping.  Ongoing since eating dinner at church on Sunday.  Labs ordered in triage reviewed, these are reassuring.  She is feeling much better after antiemetic, fluids and analgesic.  Will give some further fluids given her report of significant volume loss and inability tolerate p.o. would last 24 hours.  Will give analgesic for headache.  Trial p.o. challenge.  Ua w/ possible UTI, she has mild dysuria but no urgency or frequency, no fever, given rocephin here, doubt pyelo given lack of flank pain, she is very well appearing. She likely has concurrent food borne illness but given this will cover with abx, send ctx  HA resolved w/ apap  Tolerating PO w/o difficulty  Likely food borne illness    Patient  presents with headache. Based on the patient's history and physical there is very low clinical suspicion for significant intracranial pathology. The headache was not sudden onset, not maximal at onset, there are no neurologic findings on exam, the patient does not have a fever, the patient does not have any jaw claudication, the patient does not endorse a clotting disorder, patient denies any trauma or eye pain and the headache is not associated with dizziness, weakness on one side of the body, diplopia, vertigo, slurred speech, or ataxia. Given the extremely low risk of these diagnoses further testing and evaluation for these possibilities does not appear to be indicated at this time.  Patient presents with vomiting, diarrhea, and abdominal cramping. Sx suggestive of enteritis or food born illness. Surgical or other more serious etiology appears very unlikely. The patient is  improved with ED treatment. Will discharge with observation and symptomatic treatment. Abdominal pain warnings discussed.            Additional history obtained: -Additional history obtained from family -External records from outside source obtained and reviewed including: Chart review including previous notes, labs, imaging, consultation notes including  Prior echo from 2018 with G1 DD and LVEF is 55 to 60% Medications Prior labs and imaging  Lab Tests: -I ordered, reviewed, and interpreted labs.   The pertinent results include:   Labs Reviewed  LIPASE, BLOOD - Abnormal; Notable for the following components:      Result Value   Lipase <10 (*)    All other components within normal limits  COMPREHENSIVE METABOLIC PANEL - Abnormal; Notable for the following components:   Glucose, Bld 155 (*)    GFR, Estimated 59 (*)    All other components within normal limits  URINALYSIS, ROUTINE W REFLEX MICROSCOPIC - Abnormal; Notable for the following components:   APPearance TURBID (*)    Specific Gravity, Urine 1.034 (*)    Glucose, UA 30 (*)    Hgb urine dipstick 1 (*)    Ketones, ur 10 (*)    Protein, ur 1 (*)    Leukocytes,Ua 500 (*)    Bacteria, UA RARE (*)    All other components within normal limits  RESP PANEL BY RT-PCR (RSV, FLU A&B, COVID)  RVPGX2  URINE CULTURE  CBC    Notable for labs appear stable  EKG   EKG Interpretation Date/Time:    Ventricular Rate:    PR Interval:    QRS Duration:    QT Interval:    QTC Calculation:   R Axis:      Text Interpretation:           Imaging Studies ordered: na   Medicines ordered and prescription drug management: Meds ordered this encounter  Medications   ondansetron (ZOFRAN-ODT) disintegrating tablet 4 mg   sodium chloride 0.9 % bolus 1,000 mL   ondansetron (ZOFRAN) injection 4 mg   morphine (PF) 2 MG/ML injection 2 mg   acetaminophen (TYLENOL) tablet 1,000 mg   sodium chloride 0.9 % bolus 500 mL    cefTRIAXone (ROCEPHIN) 1 g in sodium chloride 0.9 % 100 mL IVPB    Antibiotic Indication::   UTI   ondansetron (ZOFRAN) 4 MG tablet    Sig: Take 1 tablet (4 mg total) by mouth every 8 (eight) hours as needed for nausea or vomiting.    Dispense:  6 tablet    Refill:  0   cephALEXin (KEFLEX) 500 MG capsule    Sig: Take 1 capsule (500 mg total)  by mouth 3 (three) times daily for 7 days.    Dispense:  21 capsule    Refill:  0   sucralfate (CARAFATE) 1 g tablet    Sig: Take 1 tablet (1 g total) by mouth with breakfast, with lunch, and with evening meal for 7 days.    Dispense:  21 tablet    Refill:  0    -I have reviewed the patients home medicines and have made adjustments as needed   Consultations Obtained: na   Cardiac Monitoring: Continuous pulse oximetry interpreted by myself, 97% on RA.    Social Determinants of Health:  Diagnosis or treatment significantly limited by social determinants of health: na   Reevaluation: After the interventions noted above, I reevaluated the patient and found that they have improved  Co morbidities that complicate the patient evaluation  Past Medical History:  Diagnosis Date   Angina at rest [facility])    "occurs whenever it wants to, but worse during agitation"   Anxiety    Asthma    Binge eating disorder    Breast cancer of lower-outer quadrant of left female breast (HCC) 07/08/2015   "NEVER HAD LEFT BREAST CANCER" (08/15/2015)   Cancer of right breast (HCC) 08/15/2015   Chronic bronchitis (HCC)    Chronic lower back pain    Complication of anesthesia    had bronc spasms during intubation surgery 2009 on foot-need albuterol inhaler or neb tx preop   Costochondritis    Depression    Diverticular disease    11-2020 bleed   Fibromyalgia    GERD (gastroesophageal reflux disease)    Hot flashes    Hyperlipidemia    Hypertension    Hypothyroidism    Osteoarthritis    "all over"   Peptic ulcer disease    Personal history of chemotherapy  2017   Shortness of breath dyspnea    Type II diabetes mellitus (HCC)    "diet controlled" (08/15/2015)      Dispostion: Disposition decision including need for hospitalization was considered, and patient discharged from emergency department.    Final Clinical Impression(s) / ED Diagnoses Final diagnoses:  Nausea vomiting and diarrhea  Acute cystitis without hematuria        Sloan Leiter, DO 05/14/23 0151

## 2023-05-13 NOTE — Discharge Instructions (Addendum)
You should return to the hospital if you experience return of persistent nausea and vomiting that does not resolve and does not allow you to tolerate any food or fluids, persistent fevers for greater than 2-3 more days, increasing abdominal pain that persists despite medications, persistent diarrhea, dizziness, syncope (fainting), or for any other concerns.    Please return to the emergency department immediately for any new or concerning symptoms, or if you get worse. 

## 2023-05-13 NOTE — ED Triage Notes (Signed)
Pt has been having vomiting since yesterday, she states that she has been vomiting "all day".  Pt reports HA with this.  No fever, no abdominal pain.

## 2023-05-14 DIAGNOSIS — N3000 Acute cystitis without hematuria: Secondary | ICD-10-CM | POA: Diagnosis not present

## 2023-05-14 LAB — URINALYSIS, ROUTINE W REFLEX MICROSCOPIC
Bilirubin Urine: NEGATIVE
Glucose, UA: 30 mg/dL — AB
Hgb urine dipstick: 1 — AB
Ketones, ur: 10 mg/dL — AB
Leukocytes,Ua: 500 — AB
Nitrite: NEGATIVE
Protein, ur: 1 mg/dL — AB
Specific Gravity, Urine: 1.034 — ABNORMAL HIGH (ref 1.005–1.030)
WBC, UA: >50 WBC/hpf (ref 0–5)
pH: 6 (ref 5.0–8.0)

## 2023-05-14 MED ORDER — CEFTRIAXONE SODIUM 1 G IJ SOLR
1.0000 g | Freq: Once | INTRAMUSCULAR | Status: AC
  Administered 2023-05-14: 1 g via INTRAVENOUS
  Filled 2023-05-14: qty 10

## 2023-05-14 MED ORDER — CEPHALEXIN 500 MG PO CAPS: 500.0000 mg | ORAL_CAPSULE | Freq: Three times a day (TID) | ORAL | 0 refills | Status: AC

## 2023-05-14 MED ORDER — SUCRALFATE 1 G PO TABS: 1.0000 g | ORAL_TABLET | Freq: Three times a day (TID) | ORAL | 0 refills | Status: DC

## 2023-05-14 MED ORDER — ONDANSETRON HCL 4 MG PO TABS: 4.0000 mg | ORAL_TABLET | Freq: Three times a day (TID) | ORAL | 0 refills | Status: DC | PRN

## 2023-05-15 ENCOUNTER — Telehealth: Payer: Self-pay

## 2023-05-15 LAB — URINE CULTURE

## 2023-05-15 NOTE — Transitions of Care (Post Inpatient/ED Visit) (Signed)
 05/15/2023  Name: Tracey Evans MRN: [id number] DOB: 06-27-1945  Today's TOC FU Call Status: Today's TOC FU Call Status:: Successful TOC FU Call Completed TOC FU Call Complete Date: 05/15/23 Patient's Name and Date of Birth confirmed.  Transition Care Management Follow-up Telephone Call Date of Discharge: 05/14/23 Discharge Facility: [facility] ([facility]) Type of Discharge: Emergency Department Reason for ED Visit: Other: (cystitis) How have you been since you were released from the hospital?: Better Any questions or concerns?: No  Items Reviewed: Did you receive and understand the discharge instructions provided?: Yes Medications obtained,verified, and reconciled?: Yes (Medications Reviewed) Any new allergies since your discharge?: No Dietary orders reviewed?: Yes Do you have support at home?: No  Medications Reviewed Today: Medications Reviewed Today     Reviewed by Emmitt Pan, LPN (Licensed Practical Nurse) on 05/15/23 at 1119  Med List Status: <None>   Medication Order Taking? Sig Documenting Provider Last Dose Status Informant  aspirin  81 MG tablet [id number] No Take 81 mg by mouth daily. [provider] Taking Active Self, Pharmacy Records           Med Note CONNIE RAMA PARAS   Tue Mar 12, 2023  9:36 AM) Per d/c instructions : For the next week, HOLD (do not take) your low dose aspirin  81 mg daily Then resume it  atorvastatin  (LIPITOR) 20 MG tablet [id number] No Take 1 tablet (20 mg total) by mouth daily. Tabori, Katherine E, MD Taking Active Self, Pharmacy Records  buPROPion  (WELLBUTRIN  XL) 300 MG 24 hr tablet [id number]  TAKE 1 TABLET BY MOUTH EVERY DAY Tabori, Katherine E, MD  Active   cephALEXin  (KEFLEX ) 500 MG capsule [id number]  Take 1 capsule (500 mg total) by mouth 3 (three) times daily for 7 days. Elnor Jayson LABOR, DO  Active   gabapentin  (NEURONTIN ) 300 MG capsule [id number]  TAKE 1 CAPSULE IN THE MORNING , 1 IN AFTERNOON AND 2 CAPSULES  BEFORE BED Tabori, Katherine E, MD  Active   Glycerin-Polysorbate 80 [facility] DRY EYE THERAPY OP) [id number] No Place 1 drop into both eyes 3 (three) times daily. [provider] Taking Active Self, Pharmacy Records  levothyroxine  (SYNTHROID ) 75 MCG tablet [id number] No TAKE 1 TABLET BY MOUTH EVERY DAY BEFORE BREAKFAST Tabori, Katherine E, MD Taking Active Self, Pharmacy Records  Magnesium  400 MG CAPS [id number] No Take by mouth 2 (two) times daily. [provider] Taking Active Self, Pharmacy Records  omeprazole  (PRILOSEC) 20 MG capsule [id number] No TAKE 1 CAPSULE BY MOUTH EVERY DAY  Patient taking differently: Take 20 mg by mouth daily.   Mahlon Comer BRAVO, MD Taking Active Self, Pharmacy Records  ondansetron  (ZOFRAN ) 4 MG tablet [id number]  Take 1 tablet (4 mg total) by mouth every 8 (eight) hours as needed for nausea or vomiting. Elnor Jayson A, DO  Active   polyethylene glycol powder (MIRALAX ) 17 GM/SCOOP powder [id number] No Take 17 g by mouth 2 (two) times daily as needed for mild constipation. Gonfa, Taye T, MD Taking Active Self, Pharmacy Records  prednisoLONE  acetate (PRED FORTE ) 1 % ophthalmic suspension [id number] No Place 1 drop into the left eye 6 (six) times daily. Francesca Elsie CROME, MD Taking Active   sucralfate  (CARAFATE ) 1 g tablet [id number]  Take 1 tablet (1 g total) by mouth with breakfast, with lunch, and with evening meal for 7 days. Elnor Jayson A, DO  Active   traZODone  (DESYREL ) 50 MG tablet [id number] No Take 50 mg by mouth at bedtime. [provider] Taking  Active Self, Pharmacy Records            Home Care and Equipment/Supplies: Were Home Health Services Ordered?: NA Any new equipment or medical supplies ordered?: NA  Functional Questionnaire: Do you need assistance with bathing/showering or dressing?: No Do you need assistance with meal preparation?: No Do you need assistance with eating?: No Do you have difficulty maintaining  continence: No Do you need assistance with getting out of bed/getting out of a chair/moving?: No Do you have difficulty managing or taking your medications?: No  Follow up appointments reviewed: PCP Follow-up appointment confirmed?: Yes Date of PCP follow-up appointment?: 05/17/23 Follow-up Provider: [facility] Follow-up appointment confirmed?: NA Do you need transportation to your follow-up appointment?: No Do you understand care options if your condition(s) worsen?: Yes-patient verbalized understanding    SIGNATURE Julian Lemmings, LPN [facility] Nurse Health Advisor Direct Dial [phone]

## 2023-05-16 ENCOUNTER — Telehealth: Payer: Self-pay | Admitting: Family Medicine

## 2023-05-16 NOTE — Telephone Encounter (Signed)
 Type of form received: Medical Supplies Order  Additional comments:   Received by: Charlott - Front Desk  Form should be Faxed/mailed to: (address/ fax #) [phone]  Is patient requesting call for pickup: N/A  Form placed:  Labeled & placed in provider bin  Attach charge sheet.  Provider will determine charge.  Individual made aware of 3-5 business day turn around? N/A

## 2023-05-17 ENCOUNTER — Ambulatory Visit: Payer: Medicare Other | Admitting: Family Medicine

## 2023-05-17 ENCOUNTER — Encounter: Payer: Self-pay | Admitting: Family Medicine

## 2023-05-17 VITALS — BP 128/74 | HR 71 | Temp 97.8°F | Ht 64.0 in | Wt 173.0 lb

## 2023-05-17 DIAGNOSIS — R8271 Bacteriuria: Secondary | ICD-10-CM | POA: Diagnosis not present

## 2023-05-17 DIAGNOSIS — E039 Hypothyroidism, unspecified: Secondary | ICD-10-CM

## 2023-05-17 DIAGNOSIS — R112 Nausea with vomiting, unspecified: Secondary | ICD-10-CM

## 2023-05-17 DIAGNOSIS — R197 Diarrhea, unspecified: Secondary | ICD-10-CM | POA: Diagnosis not present

## 2023-05-17 LAB — TSH: TSH: 1.25 u[IU]/mL (ref 0.35–5.50)

## 2023-05-17 NOTE — Assessment & Plan Note (Signed)
 Chronic problem.  Last TSH was elevated at 8.4  She was instructed on how to take medication correctly and we are due to repeat your labs.  She reports she is always cold.  May need to adjust medication.

## 2023-05-17 NOTE — Progress Notes (Signed)
   Subjective:    Patient ID: Tracey Evans, female    DOB: 1945/08/27, 78 y.o.   MRN: [id number]  HPI ER f/u- went to ER on 2/3 for N/V/D w/ abd cramping.  Labs were unremarkable except for UA which indicated infxn.  She felt better after receiving fluids.  She was started on Keflex , Zofran , Carafate .  Keflex  is causing nausea.  Hypothyroid- in January TSH was 8.4.  At that time, pt was instructed on how to take medication correctly and no dose adjustment was made.  Feels cold 'all the time'.   Review of Systems For ROS see HPI     Objective:   Physical Exam Vitals reviewed.  Constitutional:      General: She is not in acute distress.    Appearance: Normal appearance. She is well-developed. She is not ill-appearing.  HENT:     Head: Normocephalic and atraumatic.  Eyes:     Conjunctiva/sclera: Conjunctivae normal.     Pupils: Pupils are equal, round, and reactive to light.  Neck:     Thyroid : No thyromegaly.  Cardiovascular:     Rate and Rhythm: Normal rate and regular rhythm.     Pulses: Normal pulses.     Heart sounds: Normal heart sounds. No murmur heard. Pulmonary:     Effort: Pulmonary effort is normal. No respiratory distress.     Breath sounds: Normal breath sounds.  Abdominal:     General: There is no distension.     Palpations: Abdomen is soft.     Tenderness: There is no abdominal tenderness.  Musculoskeletal:     Cervical back: Normal range of motion and neck supple.  Lymphadenopathy:     Cervical: No cervical adenopathy.  Skin:    General: Skin is warm and dry.  Neurological:     Mental Status: She is alert and oriented to person, place, and time.  Psychiatric:        Behavior: Behavior normal.           Assessment & Plan:  N/V/D- new.  Pt believes this was due to bad food at church.  She says the onset was violent but thankfully short lived.  She still has some abdominal soreness from the retching.  Continues to have nausea- suspect this is  from the Keflex .  Will stop abx.  She is to use Zofran  prn.  Pt expressed understanding and is in agreement w/ plan.   Bacteria in urine- new.  Culture did not grow out 1 particular species.  She is not having any dysuria, frequency, urgency.  Will stop Keflex  as this is causing nausea.  Pt expressed understanding and is in agreement w/ plan.

## 2023-05-17 NOTE — Patient Instructions (Signed)
 Follow up as needed or as scheduled We'll notify you of your lab results and make any changes if needed STOP the Cephalexin  (the smelly green capsule) USE the Sucralfate  (the big white pill) for a few more days to allow the stomach to heal TAKE the Ondansetron  (the tiny pill) for nausea as needed Drink LOTS of fluids Call w/ any questions or concerns HAPPY VALENTINE'S DAY!!

## 2023-05-17 NOTE — Telephone Encounter (Signed)
 Placed in folder at nurse station

## 2023-05-19 ENCOUNTER — Encounter: Payer: Self-pay | Admitting: Family Medicine

## 2023-05-20 ENCOUNTER — Telehealth: Payer: Self-pay

## 2023-05-20 NOTE — Telephone Encounter (Signed)
 Pt states she did not request any of these.

## 2023-05-20 NOTE — Telephone Encounter (Signed)
 Please ask pt if she wants the knee brace and back brace that we received paperwork for.  Often these can be scams and I don't want to sign anything without her knowledge/consent

## 2023-05-20 NOTE — Telephone Encounter (Signed)
 Noted.

## 2023-05-20 NOTE — Telephone Encounter (Signed)
 Pt has reviewed labs results via MyChart

## 2023-05-20 NOTE — Telephone Encounter (Signed)
 Forms were shredded

## 2023-05-20 NOTE — Telephone Encounter (Signed)
-----   Message from Laymon Priest sent at 05/19/2023  5:01 PM EST ----- TSH is now normal- great news!  No med changes at this time

## 2023-05-29 ENCOUNTER — Other Ambulatory Visit: Payer: Self-pay | Admitting: Family Medicine

## 2023-06-07 NOTE — Telephone Encounter (Signed)
 Same forms were received via fax again today. Deleted fax

## 2023-06-12 DIAGNOSIS — M542 Cervicalgia: Secondary | ICD-10-CM | POA: Diagnosis not present

## 2023-06-14 DIAGNOSIS — H33322 Round hole, left eye: Secondary | ICD-10-CM | POA: Diagnosis not present

## 2023-07-05 DIAGNOSIS — M170 Bilateral primary osteoarthritis of knee: Secondary | ICD-10-CM | POA: Diagnosis not present

## 2023-07-11 DIAGNOSIS — Z8719 Personal history of other diseases of the digestive system: Secondary | ICD-10-CM | POA: Diagnosis not present

## 2023-07-11 DIAGNOSIS — M5030 Other cervical disc degeneration, unspecified cervical region: Secondary | ICD-10-CM | POA: Diagnosis not present

## 2023-07-25 DIAGNOSIS — E782 Mixed hyperlipidemia: Secondary | ICD-10-CM | POA: Diagnosis not present

## 2023-07-25 DIAGNOSIS — R7303 Prediabetes: Secondary | ICD-10-CM | POA: Diagnosis not present

## 2023-07-25 DIAGNOSIS — I10 Essential (primary) hypertension: Secondary | ICD-10-CM | POA: Diagnosis not present

## 2023-07-25 DIAGNOSIS — I2510 Atherosclerotic heart disease of native coronary artery without angina pectoris: Secondary | ICD-10-CM | POA: Diagnosis not present

## 2023-08-15 DIAGNOSIS — M542 Cervicalgia: Secondary | ICD-10-CM | POA: Diagnosis not present

## 2023-08-15 DIAGNOSIS — M1712 Unilateral primary osteoarthritis, left knee: Secondary | ICD-10-CM | POA: Diagnosis not present

## 2023-08-15 DIAGNOSIS — M25561 Pain in right knee: Secondary | ICD-10-CM | POA: Diagnosis not present

## 2023-08-31 ENCOUNTER — Other Ambulatory Visit: Payer: Self-pay | Admitting: Family Medicine

## 2023-08-31 DIAGNOSIS — M797 Fibromyalgia: Secondary | ICD-10-CM

## 2023-09-04 ENCOUNTER — Other Ambulatory Visit: Payer: Self-pay | Admitting: Family Medicine

## 2023-09-04 DIAGNOSIS — E039 Hypothyroidism, unspecified: Secondary | ICD-10-CM

## 2023-09-04 DIAGNOSIS — K219 Gastro-esophageal reflux disease without esophagitis: Secondary | ICD-10-CM

## 2023-09-05 ENCOUNTER — Encounter: Payer: Self-pay | Admitting: Family Medicine

## 2023-09-05 ENCOUNTER — Ambulatory Visit (INDEPENDENT_AMBULATORY_CARE_PROVIDER_SITE_OTHER): Admitting: Family Medicine

## 2023-09-05 VITALS — BP 142/84 | HR 80 | Temp 98.0°F | Wt 174.2 lb

## 2023-09-05 DIAGNOSIS — R413 Other amnesia: Secondary | ICD-10-CM

## 2023-09-05 DIAGNOSIS — M797 Fibromyalgia: Secondary | ICD-10-CM

## 2023-09-05 LAB — POCT URINALYSIS DIPSTICK
Bilirubin, UA: NEGATIVE
Blood, UA: NEGATIVE
Glucose, UA: NEGATIVE
Ketones, UA: NEGATIVE
Leukocytes, UA: NEGATIVE
Nitrite, UA: NEGATIVE
Protein, UA: NEGATIVE
Spec Grav, UA: 1.025
Urobilinogen, UA: 0.2 U/dL
pH, UA: 5

## 2023-09-05 MED ORDER — PREDNISONE 10 MG PO TABS: ORAL_TABLET | ORAL | 0 refills | Status: DC

## 2023-09-05 NOTE — Progress Notes (Signed)
   Subjective:    Patient ID: Tracey Evans, female    DOB: 1946-01-13, 78 y.o.   MRN: [id number]  HPI Fibromyalgia- pt reports she is unable to touch arms or legs without significant pain.  Pain started ~2 weeks ago and coincides w/ memory issues.  Memory issues- pt reports she is having a hard time remembering what is told to her before she can even write it down.  Daughter Tarri Farm said she is going to 'put her in a home' due to memory concerns.  Sxs dramatically worsened ~2 weeks ago.  Pt reports some burning w/ urination, increased urgency and incontinence.  Admits to decreased water intake.   Review of Systems For ROS see HPI     Objective:   Physical Exam Vitals reviewed.  Constitutional:      General: She is not in acute distress.    Appearance: Normal appearance. She is not ill-appearing.  HENT:     Head: Normocephalic and atraumatic.  Eyes:     Extraocular Movements: Extraocular movements intact.     Conjunctiva/sclera: Conjunctivae normal.  Cardiovascular:     Rate and Rhythm: Normal rate and regular rhythm.  Pulmonary:     Effort: Pulmonary effort is normal. No respiratory distress.     Breath sounds: No wheezing or rhonchi.  Musculoskeletal:        General: Tenderness (TTP over multiple trigger points) present.     Cervical back: Neck supple. No rigidity.  Skin:    General: Skin is warm and dry.  Neurological:     General: No focal deficit present.     Mental Status: She is alert and oriented to person, place, and time.  Psychiatric:        Mood and Affect: Mood normal.        Behavior: Behavior normal.        Thought Content: Thought content normal.           Assessment & Plan:  Memory issues- new.  Sxs started around the time of her fibro flare.  She is also having sxs of UTI.  UA w/o evidence of infxn but will send for cx.  Discussed that UTI and pain can cause confusion, distraction, and inability to focus/pain attention.  If UTI present on cx  will tx.  Will tx fibro flare and see if once pain improves, memory will as well.  If sxs don't improve as other issues do, will pursue additional w/u.  Pt expressed understanding and is in agreement w/ plan.

## 2023-09-05 NOTE — Patient Instructions (Signed)
 Follow up as needed or as scheduled We'll see what your urine shows and treat for infection if needed START the Prednisone  as directed- 3 pills at the same time x3 days, then 2 pills at the same time x3 days, then 1 pill daily.  Take w/ food  INCREASE your water intake Once your pain is better and we make sure there is no infection, we can reassess the memory Call with any questions or concerns Stay Safe!  Stay Healthy! Hang in there!

## 2023-09-06 LAB — URINE CULTURE
MICRO NUMBER:: 16514551
Result:: NO GROWTH
SPECIMEN QUALITY:: ADEQUATE

## 2023-09-07 ENCOUNTER — Ambulatory Visit: Payer: Self-pay | Admitting: Family Medicine

## 2023-09-07 NOTE — Assessment & Plan Note (Signed)
 Deteriorated.  Pt is current flare w/ TTP over multiple trigger points.  Sxs started ~2 weeks ago.  Will start Prednisone  to avoid NSAIDs.  Cautioned her about her sugars in the setting of steroid use.  Pt expressed understanding and is in agreement w/ plan.

## 2023-10-01 DIAGNOSIS — M1712 Unilateral primary osteoarthritis, left knee: Secondary | ICD-10-CM | POA: Diagnosis not present

## 2023-10-03 ENCOUNTER — Other Ambulatory Visit: Payer: Self-pay | Admitting: Family Medicine

## 2023-10-04 ENCOUNTER — Ambulatory Visit: Payer: Medicare Other | Admitting: Family Medicine

## 2023-10-08 DIAGNOSIS — M1712 Unilateral primary osteoarthritis, left knee: Secondary | ICD-10-CM | POA: Diagnosis not present

## 2023-10-15 DIAGNOSIS — M25562 Pain in left knee: Secondary | ICD-10-CM | POA: Insufficient documentation

## 2023-10-15 DIAGNOSIS — M1712 Unilateral primary osteoarthritis, left knee: Secondary | ICD-10-CM | POA: Diagnosis not present

## 2023-10-18 ENCOUNTER — Ambulatory Visit: Admitting: Family Medicine

## 2023-10-25 DIAGNOSIS — I10 Essential (primary) hypertension: Secondary | ICD-10-CM | POA: Diagnosis not present

## 2023-10-25 DIAGNOSIS — I2510 Atherosclerotic heart disease of native coronary artery without angina pectoris: Secondary | ICD-10-CM | POA: Diagnosis not present

## 2023-10-25 DIAGNOSIS — E039 Hypothyroidism, unspecified: Secondary | ICD-10-CM | POA: Diagnosis not present

## 2023-10-25 DIAGNOSIS — E782 Mixed hyperlipidemia: Secondary | ICD-10-CM | POA: Diagnosis not present

## 2023-11-22 ENCOUNTER — Encounter: Payer: Self-pay | Admitting: Family Medicine

## 2023-11-22 ENCOUNTER — Ambulatory Visit: Admitting: Family Medicine

## 2023-11-22 VITALS — BP 130/74 | HR 79 | Temp 98.0°F | Ht 64.0 in | Wt 167.2 lb

## 2023-11-22 DIAGNOSIS — I10 Essential (primary) hypertension: Secondary | ICD-10-CM | POA: Diagnosis not present

## 2023-11-22 DIAGNOSIS — E785 Hyperlipidemia, unspecified: Secondary | ICD-10-CM

## 2023-11-22 DIAGNOSIS — E1169 Type 2 diabetes mellitus with other specified complication: Secondary | ICD-10-CM

## 2023-11-22 DIAGNOSIS — E119 Type 2 diabetes mellitus without complications: Secondary | ICD-10-CM

## 2023-11-22 LAB — LIPID PANEL
Cholesterol: 124 mg/dL (ref 0–200)
HDL: 57.2 mg/dL (ref >39.00)
LDL Cholesterol: 53 mg/dL (ref 0–99)
NonHDL: 66.78
Total CHOL/HDL Ratio: 2
Triglycerides: 70 mg/dL (ref 0.0–149.0)
VLDL: 14 mg/dL (ref 0.0–40.0)

## 2023-11-22 LAB — CBC WITH DIFFERENTIAL/PLATELET
Basophils Absolute: 0 K/uL (ref 0.0–0.1)
Basophils Relative: 0.7 % (ref 0.0–3.0)
Eosinophils Absolute: 0.2 K/uL (ref 0.0–0.7)
Eosinophils Relative: 2.4 % (ref 0.0–5.0)
HCT: 37.7 % (ref 36.0–46.0)
Hemoglobin: 12.2 g/dL (ref 12.0–15.0)
Lymphocytes Relative: 27.4 % (ref 12.0–46.0)
Lymphs Abs: 1.7 K/uL (ref 0.7–4.0)
MCHC: 32.5 g/dL (ref 30.0–36.0)
MCV: 86.6 fl (ref 78.0–100.0)
Monocytes Absolute: 0.4 K/uL (ref 0.1–1.0)
Monocytes Relative: 6.3 % (ref 3.0–12.0)
Neutro Abs: 4 K/uL (ref 1.4–7.7)
Neutrophils Relative %: 63.2 % (ref 43.0–77.0)
Platelets: 269 K/uL (ref 150.0–400.0)
RBC: 4.35 Mil/uL (ref 3.87–5.11)
RDW: 16.1 % — ABNORMAL HIGH (ref 11.5–15.5)
WBC: 6.3 K/uL (ref 4.0–10.5)

## 2023-11-22 LAB — BASIC METABOLIC PANEL WITH GFR
BUN: 13 mg/dL (ref 6–23)
CO2: 31 meq/L (ref 19–32)
Calcium: 9.8 mg/dL (ref 8.4–10.5)
Chloride: 101 meq/L (ref 96–112)
Creatinine, Ser: 0.9 mg/dL (ref 0.40–1.20)
GFR: 61.53 mL/min (ref >60.00)
Glucose, Bld: 83 mg/dL (ref 70–99)
Potassium: 4.2 meq/L (ref 3.5–5.1)
Sodium: 140 meq/L (ref 135–145)

## 2023-11-22 LAB — HEPATIC FUNCTION PANEL
ALT: 14 U/L (ref 0–35)
AST: 17 U/L (ref 0–37)
Albumin: 4.4 g/dL (ref 3.5–5.2)
Alkaline Phosphatase: 98 U/L (ref 39–117)
Bilirubin, Direct: 0.1 mg/dL (ref 0.0–0.3)
Total Bilirubin: 0.5 mg/dL (ref 0.2–1.2)
Total Protein: 7.1 g/dL (ref 6.0–8.3)

## 2023-11-22 LAB — TSH: TSH: 2.62 u[IU]/mL (ref 0.35–5.50)

## 2023-11-22 LAB — MICROALBUMIN / CREATININE URINE RATIO
Creatinine,U: 71 mg/dL
Microalb Creat Ratio: 13.5 mg/g (ref 0.0–30.0)
Microalb, Ur: 1 mg/dL (ref 0.0–1.9)

## 2023-11-22 LAB — HEMOGLOBIN A1C: Hgb A1c MFr Bld: 6.6 % — ABNORMAL HIGH (ref 4.6–6.5)

## 2023-11-22 NOTE — Patient Instructions (Signed)
 Follow up in 6 months to recheck BP, cholesterol, sugar We'll notify you of your lab results and make any changes if needed Keep up the good work!  You look great! Call with any questions or concerns Stay Safe!  Stay Healthy! Hang in there!

## 2023-11-22 NOTE — Progress Notes (Signed)
   Subjective:    Patient ID: [name], female    DOB: April 20, 1945, 78 y.o.   MRN: [id number]  HPI DM- chronic problem.  Currently diet controlled.  UTD on eye exam, foot exam.  Due for microalbumin.  Down 7 lbs since last visit.  HTN- chronic problem, on Amlodipine  5mg  daily, Losartan 100mg  daily w/ good control.  No CP, SOB, HA's, visual changes  Hyperlipidemia- chronic problem, on Lipitor 20mg  daily.  No abd pain, N/V.   Review of Systems For ROS see HPI     Objective:   Physical Exam Constitutional:      General: She is not in acute distress.    Appearance: Normal appearance. She is well-developed. She is not ill-appearing.  HENT:     Head: Normocephalic and atraumatic.  Eyes:     Conjunctiva/sclera: Conjunctivae normal.     Pupils: Pupils are equal, round, and reactive to light.  Neck:     Thyroid : No thyromegaly.  Cardiovascular:     Rate and Rhythm: Normal rate and regular rhythm.     Heart sounds: Normal heart sounds. No murmur heard. Pulmonary:     Effort: Pulmonary effort is normal. No respiratory distress.     Breath sounds: Normal breath sounds.  Abdominal:     General: There is no distension.     Palpations: Abdomen is soft.     Tenderness: There is no abdominal tenderness.  Lymphadenopathy:     Cervical: No cervical adenopathy.  Skin:    General: Skin is warm and dry.  Neurological:     Mental Status: She is alert and oriented to person, place, and time.  Psychiatric:        Behavior: Behavior normal.           Assessment & Plan:

## 2023-11-23 NOTE — Assessment & Plan Note (Signed)
Chronic problem.  Currently on Lipitor 20mg daily w/o difficulty.  Check labs.  Adjust meds prn  

## 2023-11-23 NOTE — Assessment & Plan Note (Signed)
 Chronic problem.  Well controlled on Amlodipine  and Losartan.  Currently asymptomatic.  Will check labs due to ARB use but no anticipated med changes.

## 2023-11-23 NOTE — Assessment & Plan Note (Signed)
 Chronic problem.  Currently diet controlled.  Is down 7 lbs since last visit.  UTD on eye exam, foot exam.  Microalbumin ordered today.  Will check A1C and start meds prn.  Pt expressed understanding and is in agreement w/ plan.

## 2023-11-25 ENCOUNTER — Ambulatory Visit: Payer: Self-pay | Admitting: Family Medicine

## 2023-11-25 NOTE — Progress Notes (Signed)
 Pt reports she has been notified Pt reports she has been stress eating and she will cut back

## 2023-11-30 ENCOUNTER — Other Ambulatory Visit: Payer: Self-pay | Admitting: Family Medicine

## 2023-12-16 ENCOUNTER — Other Ambulatory Visit: Payer: Self-pay | Admitting: Hematology and Oncology

## 2023-12-16 DIAGNOSIS — Z1231 Encounter for screening mammogram for malignant neoplasm of breast: Secondary | ICD-10-CM

## 2023-12-16 DIAGNOSIS — M170 Bilateral primary osteoarthritis of knee: Secondary | ICD-10-CM | POA: Diagnosis not present

## 2023-12-18 ENCOUNTER — Telehealth: Payer: Self-pay

## 2023-12-18 NOTE — Telephone Encounter (Signed)
 Type of form received: pre surgical clearance   Additional comments:   Received by: Fax  Form should be Faxed/mailed to: [phone]  Is patient requesting call for pickup:  Form placed:  In provider folder at front desk   Attach charge sheet.  Provider will determine charge.  Individual made aware of 3-5 business day turn around No?  11/22/23 last OV

## 2023-12-23 ENCOUNTER — Ambulatory Visit
Admission: RE | Admit: 2023-12-23 | Discharge: 2023-12-23 | Disposition: A | Discharge status: Home / Self Care | Source: Ambulatory Visit | Attending: Hematology and Oncology | Admitting: Hematology and Oncology

## 2023-12-23 ENCOUNTER — Other Ambulatory Visit: Payer: Self-pay | Admitting: Hematology and Oncology

## 2023-12-23 DIAGNOSIS — Z1231 Encounter for screening mammogram for malignant neoplasm of breast: Secondary | ICD-10-CM

## 2024-01-03 ENCOUNTER — Ambulatory Visit (INDEPENDENT_AMBULATORY_CARE_PROVIDER_SITE_OTHER): Admitting: Family Medicine

## 2024-01-03 ENCOUNTER — Encounter: Payer: Self-pay | Admitting: Family Medicine

## 2024-01-03 VITALS — BP 118/62 | HR 66 | Temp 98.0°F | Ht 64.0 in | Wt 158.4 lb

## 2024-01-03 DIAGNOSIS — F331 Major depressive disorder, recurrent, moderate: Secondary | ICD-10-CM

## 2024-01-03 DIAGNOSIS — Z23 Encounter for immunization: Secondary | ICD-10-CM | POA: Diagnosis not present

## 2024-01-03 MED ORDER — CITALOPRAM HYDROBROMIDE 10 MG PO TABS: 10.0000 mg | ORAL_TABLET | Freq: Every day | ORAL | 3 refills | Status: DC

## 2024-01-03 NOTE — Patient Instructions (Signed)
 Follow up in 4 weeks to recheck mood and do surgical clearance START the Citalopram  once daily to help w/ stress levels CONTINUE the Wellbutrin  It's ok to set boundaries and say no! Call with any questions or concerns Stay Safe!  Stay Healthy! Hang in there!!!

## 2024-01-03 NOTE — Progress Notes (Signed)
   Subjective:    Patient ID: Tracey Evans, female    DOB: 1945-06-08, 78 y.o.   MRN: [id number]  HPI Stress- 'i've been a nervous wreck'.  Pt is having a hard time at work and trying to set boundaries after 'being too nice for too long'.  She is moving, has upcoming surgery.  She is worried about her financial situation when she will need to be off work.  Is having issues remembering b/c she is so overwhelmed.   Review of Systems For ROS see HPI     Objective:   Physical Exam Vitals reviewed.  Constitutional:      General: She is not in acute distress.    Appearance: Normal appearance. She is not ill-appearing.  HENT:     Head: Normocephalic and atraumatic.  Eyes:     Extraocular Movements: Extraocular movements intact.     Conjunctiva/sclera: Conjunctivae normal.  Cardiovascular:     Rate and Rhythm: Normal rate and regular rhythm.  Pulmonary:     Effort: Pulmonary effort is normal. No respiratory distress.  Skin:    General: Skin is warm and dry.  Neurological:     General: No focal deficit present.     Mental Status: She is alert and oriented to person, place, and time.  Psychiatric:        Mood and Affect: Mood normal.        Behavior: Behavior normal.        Thought Content: Thought content normal.           Assessment & Plan:

## 2024-01-03 NOTE — Assessment & Plan Note (Signed)
 Deteriorated.  Pt is on Wellbutrin  but reports increased stress levels recently- particularly at work.  They are asking her to come in on her days off and work for free or to stay late.  She is having a hard time setting boundaries.  Her daughter lives w/ her but is not yet working full time so there are financial pressures.  Will add low dose Citalopram  and monitor closely for improvement.  Pt expressed understanding and is in agreement w/ plan.

## 2024-01-16 NOTE — Addendum Note (Signed)
 Addended by: Albirda Shiel on: 01/16/2024 08:42 AM   Modules accepted: Orders

## 2024-01-24 DIAGNOSIS — I2510 Atherosclerotic heart disease of native coronary artery without angina pectoris: Secondary | ICD-10-CM | POA: Diagnosis not present

## 2024-01-24 DIAGNOSIS — E039 Hypothyroidism, unspecified: Secondary | ICD-10-CM | POA: Diagnosis not present

## 2024-01-24 DIAGNOSIS — E782 Mixed hyperlipidemia: Secondary | ICD-10-CM | POA: Diagnosis not present

## 2024-01-24 DIAGNOSIS — I10 Essential (primary) hypertension: Secondary | ICD-10-CM | POA: Diagnosis not present

## 2024-01-25 ENCOUNTER — Other Ambulatory Visit: Payer: Self-pay | Admitting: Family Medicine

## 2024-01-28 ENCOUNTER — Ambulatory Visit

## 2024-01-31 ENCOUNTER — Ambulatory Visit (INDEPENDENT_AMBULATORY_CARE_PROVIDER_SITE_OTHER): Admitting: Family Medicine

## 2024-01-31 ENCOUNTER — Encounter: Payer: Self-pay | Admitting: Family Medicine

## 2024-01-31 VITALS — BP 124/70 | HR 67 | Temp 98.0°F | Resp 14 | Ht 64.0 in | Wt 162.8 lb

## 2024-01-31 DIAGNOSIS — Z01818 Encounter for other preprocedural examination: Secondary | ICD-10-CM | POA: Diagnosis not present

## 2024-01-31 MED ORDER — CITALOPRAM HYDROBROMIDE 20 MG PO TABS: 20.0000 mg | ORAL_TABLET | Freq: Every day | ORAL | 3 refills | Status: AC

## 2024-01-31 NOTE — Progress Notes (Signed)
 Subjective:    Tracey Evans is a 78 y.o. female who presents to the office today for a preoperative consultation at the request of surgeon Dr Ernie who plans on performing total knee on December 9. This consultation is requested for the specific conditions prompting preoperative evaluation (i.e. because of potential affect on operative risk): anemia, DM. Planned anesthesia: spinal. The patient has the following known anesthesia issues: no known issues. Patients bleeding risk: remote history of abnormal bleeding (GI bleed 3-4 yrs ago). Patient does not have objections to receiving blood products if needed.  The following portions of the patient's history were reviewed and updated as appropriate: allergies, current medications, past family history, past medical history, past social history, past surgical history, and problem list.  Review of Systems A comprehensive review of systems was negative.    Objective:    BP 124/70   Pulse 67   Temp 98 F (36.7 C) (Temporal)   Resp 14   Ht 5' 4 (1.626 m)   Wt 162 lb 12.8 oz (73.8 kg)   SpO2 97%   BMI 27.94 kg/m   General Appearance:    Alert, cooperative, no distress, appears stated age  Head:    Normocephalic, without obvious abnormality, atraumatic  Eyes:    PERRL, conjunctiva/corneas clear, EOM's intact both eyes  Ears:    Normal TM's and external ear canals, both ears  Nose:   Nares normal, septum midline, mucosa normal, no drainage  or sinus tenderness  Throat:   Lips, mucosa, and tongue normal; broken front teeth  Neck:   Supple, symmetrical, trachea midline, no adenopathy;    thyroid :  no enlargement/tenderness/nodules  Back:     Symmetric, no curvature, ROM normal, no CVA tenderness  Lungs:     Clear to auscultation bilaterally, respirations unlabored  Chest Wall:    No tenderness or deformity   Heart:    Regular rate and rhythm, S1 and S2 normal, no murmur, rub   or gallop  Breast Exam:      Abdomen:     Soft, non-tender,  bowel sounds active all four quadrants,    no masses, no organomegaly  Genitalia:    Rectal:    Extremities:   Extremities normal, atraumatic, no cyanosis or edema  Pulses:   2+ and symmetric all extremities  Skin:   Skin color, texture, turgor normal, no rashes or lesions  Lymph nodes:   Cervical, supraclavicular, and axillary nodes normal  Neurologic:   CNII-XII intact, normal strength, sensation and reflexes    throughout    Predictors of intubation difficulty:  Morbid obesity? no  Anatomically abnormal facies? no  Prominent incisors? no  Receding mandible? no  Short, thick neck? no  Neck range of motion: normal  Dentition: Chipped teeth present (front upper teeth)  Cardiographics ECG: per cardiology Echocardiogram: not done  Imaging Chest x-ray: NA   Lab Review  Office Visit on 11/22/2023  Component Date Value   Microalb, Ur 11/22/2023 1.0    Creatinine,U 11/22/2023 71.0    Microalb Creat Ratio 11/22/2023 13.5    Hgb A1c MFr Bld 11/22/2023 6.6 (H)    Cholesterol 11/22/2023 124    Triglycerides 11/22/2023 70.0    HDL 11/22/2023 57.20    VLDL 11/22/2023 14.0    LDL Cholesterol 11/22/2023 53    Total CHOL/HDL Ratio 11/22/2023 2    NonHDL 11/22/2023 66.78    Sodium 11/22/2023 140    Potassium 11/22/2023 4.2    Chloride 11/22/2023 101  CO2 11/22/2023 31    Glucose, Bld 11/22/2023 83    BUN 11/22/2023 13    Creatinine, Ser 11/22/2023 0.90    GFR 11/22/2023 61.53    Calcium  11/22/2023 9.8    TSH 11/22/2023 2.62    Total Bilirubin 11/22/2023 0.5    Bilirubin, Direct 11/22/2023 0.1    Alkaline Phosphatase 11/22/2023 98    AST 11/22/2023 17    ALT 11/22/2023 14    Total Protein 11/22/2023 7.1    Albumin 11/22/2023 4.4    WBC 11/22/2023 6.3    RBC 11/22/2023 4.35    Hemoglobin 11/22/2023 12.2    HCT 11/22/2023 37.7    MCV 11/22/2023 86.6    MCHC 11/22/2023 32.5    RDW 11/22/2023 16.1 (H)    Platelets 11/22/2023 269.0    Neutrophils Relative % 11/22/2023  63.2    Lymphocytes Relative 11/22/2023 27.4    Monocytes Relative 11/22/2023 6.3    Eosinophils Relative 11/22/2023 2.4    Basophils Relative 11/22/2023 0.7    Neutro Abs 11/22/2023 4.0    Lymphs Abs 11/22/2023 1.7    Monocytes Absolute 11/22/2023 0.4    Eosinophils Absolute 11/22/2023 0.2    Basophils Absolute 11/22/2023 0.0   Office Visit on 09/05/2023  Component Date Value   Color, UA 09/05/2023 yellow    Clarity, UA 09/05/2023 clear    Glucose, UA 09/05/2023 Negative    Bilirubin, UA 09/05/2023 neg    Ketones, UA 09/05/2023 neg    Spec Grav, UA 09/05/2023 1.025    Blood, UA 09/05/2023 neg    pH, UA 09/05/2023 5.0    Protein, UA 09/05/2023 Negative    Urobilinogen, UA 09/05/2023 0.2    Nitrite, UA 09/05/2023 neg    Leukocytes, UA 09/05/2023 Negative    MICRO NUMBER: 09/05/2023 [id number]    SPECIMEN QUALITY: 09/05/2023 Adequate    Sample Source 09/05/2023 URINE    STATUS: 09/05/2023 FINAL    Result: 09/05/2023 No Growth       Assessment:      78 y.o. female with planned surgery as above.   Known risk factors for perioperative complications: Anemia Diabetes mellitus   Difficulty with intubation is not anticipated.  Cardiac Risk Estimation: low  Current medications which may produce withdrawal symptoms if withheld perioperatively: none    Plan:    1. Preoperative workup as follows none. 2. Change in medication regimen before surgery: none, continue medication regimen including morning of surgery, with sip of water. 3. Prophylaxis for cardiac events with perioperative beta-blockers: not indicated. 4. Invasive hemodynamic monitoring perioperatively: at the discretion of anesthesiologist. 5. Deep vein thrombosis prophylaxis postoperatively:regimen to be chosen by surgical team. 6. Surveillance for postoperative MI with ECG immediately postoperatively and on postoperative days 1 and 2 AND troponin levels 24 hours postoperatively and on day 4 or hospital discharge  (whichever comes first): at the discretion of anesthesiologist. 7. Other measures: none

## 2024-01-31 NOTE — Patient Instructions (Signed)
 Follow up as needed or as scheduled No need to repeat labs today- yay!!! Keep up the good work on healthy diet and regular exercise- you look great! Call with any questions or concerns Stay safe!  Stay healthy! GOOD LUCK WITH SURGERY!!!

## 2024-02-03 NOTE — Telephone Encounter (Signed)
 Faxed and placed in scan bin

## 2024-02-03 NOTE — Telephone Encounter (Signed)
 Form completed and returned to [place]

## 2024-02-13 ENCOUNTER — Ambulatory Visit: Payer: Self-pay

## 2024-02-13 ENCOUNTER — Ambulatory Visit (INDEPENDENT_AMBULATORY_CARE_PROVIDER_SITE_OTHER): Admitting: Student in an Organized Health Care Education/Training Program

## 2024-02-13 ENCOUNTER — Encounter: Payer: Self-pay | Admitting: Student in an Organized Health Care Education/Training Program

## 2024-02-13 VITALS — BP 130/80 | HR 72 | Wt 162.0 lb

## 2024-02-13 DIAGNOSIS — J069 Acute upper respiratory infection, unspecified: Secondary | ICD-10-CM

## 2024-02-13 LAB — POCT INFLUENZA A/B
Influenza A, POC: NEGATIVE
Influenza B, POC: NEGATIVE

## 2024-02-13 LAB — POC COVID19 BINAXNOW: SARS Coronavirus 2 Ag: NEGATIVE

## 2024-02-13 NOTE — Telephone Encounter (Signed)
 FYI Only or Action Required?: FYI only for provider: appointment scheduled on 02/13/2024 at 11:20 AM.  Patient was last seen in primary care on 01/31/2024 by Mahlon Comer BRAVO, MD.  Called Nurse Triage reporting Cough.  Symptoms began 2 days ago.  Interventions attempted: Rest, hydration, or home remedies.  Symptoms are: unchanged.  Triage Disposition: See Physician Within 24 Hours  Patient/caregiver understands and will follow disposition?: Yes  Copied from CRM [id number]. Topic: Clinical - Red Word Triage >> Feb 13, 2024  9:16 AM Burnard DEL wrote: Red Word that prompted transfer to Nurse Triage: green mucous,nasal congestion,sore throat,cough Reason for Disposition  SEVERE coughing spells (e.g., whooping sound after coughing, vomiting after coughing)  Answer Assessment - Initial Assessment Questions Patient reports 2 days of cough, sore throat, and nasal congestion. Patient reports she is having green nasal discharge.   1. ONSET: When did the cough begin?      Started 2 days ago 2. SEVERITY: How bad is the cough today?      moderate 3. SPUTUM: Describe the color of your sputum (e.g., none, dry cough; clear, white, yellow, green)     Clear, white 4. HEMOPTYSIS: Are you coughing up any blood? If Yes, ask: How much? (e.g., flecks, streaks, tablespoons, etc.)     no 5. DIFFICULTY BREATHING: Are you having difficulty breathing? If Yes, ask: How bad is it? (e.g., mild, moderate, severe)      no 6. FEVER: Do you have a fever? If Yes, ask: What is your temperature, how was it measured, and when did it start?     no 7. CARDIAC HISTORY: Do you have any history of heart disease? (e.g., heart attack, congestive heart failure)      no 8. LUNG HISTORY: Do you have any history of lung disease?  (e.g., pulmonary embolus, asthma, emphysema)     yes 9. PE RISK FACTORS: Do you have a history of blood clots? (or: recent major surgery, recent prolonged travel, bedridden)      no 10. OTHER SYMPTOMS: Do you have any other symptoms? (e.g., runny nose, wheezing, chest pain)       Nasal congestion (green discharge), sore throat 11. TRAVEL: Have you traveled out of the country in the last month? (e.g., travel history, exposures)       no  Protocols used: Cough - Acute Productive-A-AH

## 2024-02-13 NOTE — Progress Notes (Signed)
   Acute Office Visit  Patient ID: Tracey Evans, female    DOB: 12-Jan-1946, 78 y.o.   MRN: [id number]  PCP: Mahlon Comer BRAVO, MD  Chief Complaint  Patient presents with   Cough    Cough,congestion and green mucus   Please see triage notes     Subjective:     HPI  Discussed the use of AI scribe software for clinical note transcription with the patient, who gave verbal consent to proceed.  History of Present Illness Tracey Evans is a 78 year old female with asthma who presents with a sore throat and cough.  She has been experiencing a severe sore throat that makes swallowing difficult, along with coughing up green mucus. Symptoms began two days ago with nasal congestion and alternating hot and cold sensations, worsening each day. By this morning, her throat pain intensified, and she started coughing up mucus. No fever, nausea, ear pain, or facial sinus pain. She experiences difficulty breathing only when wearing a mask, otherwise breathing is fine. She encountered a sick church member recently but believes the risk of transmission was low. She is eating normally but finds drinking more challenging.  She has a history of asthma but does not take medication for it as it is sporadic. Her past medical history also includes breast cancer, with chemotherapy completed eight and a half years ago.  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     Objective:    BP 130/80   Pulse 72   Wt 162 lb (73.5 kg)   SpO2 99%   BMI 27.81 kg/m   Physical Exam  Gen: Well-appearing Ears: Bilateral tympanic membranes are normal. Neck: No tender adenopathy Mouth: Posterior oropharynx is moderately erythematous, no exudate Heart: Regular, no murmur Lungs: Unlabored, clear throughout, no crackles or wheezing     Assessment & Plan:   Problem List Items Addressed This Visit       Unprioritized   URI (upper respiratory infection) - Primary   She has an acute viral respiratory tract  infection with sore throat, productive cough, and nasal congestion.  COVID and flu testing are negative.  Antibiotics are unnecessary. She should stay home from work for at least five days and may return if symptoms improve by Monday.  I recommended supportive care.  We talked about safe use of Tylenol  and ibuprofen as needed for body aches and sore throats.  She declines intranasal steroids.  We talked about rest, hydration.       Relevant Orders   POC COVID-19 (Completed)   POCT Influenza A/B (Completed)    Return if symptoms worsen or fail to improve.  Cleatus Debby Specking, MD [facility]

## 2024-02-13 NOTE — Patient Instructions (Signed)
  VISIT SUMMARY: Tracey Evans, you visited today due to a severe sore throat and cough that started two days ago. You also experienced nasal congestion and alternating hot and cold sensations. Your symptoms have worsened each day, and you are now coughing up green mucus. You have a history of asthma but do not take medication for it as it is sporadic. You recently encountered a sick church member but believe the risk of transmission was low.  YOUR PLAN: -VIRAL RESPIRATORY TRACT INFECTION: You have an acute viral respiratory tract infection, which is causing your sore throat, productive cough, and nasal congestion. We are waiting for the results of tests for COVID-19 and influenza. Antibiotics are not needed at this time. Please stay home from work for at least five days and you may return if your symptoms improve by Monday.  -ASTHMA: Your asthma is sporadic and does not currently require medication. Please monitor your symptoms and let us  know if you experience any difficulty breathing.  INSTRUCTIONS: Please stay home from work for at least five days and you may return if your symptoms improve by Monday. Await the results of your COVID-19 and influenza tests.

## 2024-02-13 NOTE — Assessment & Plan Note (Addendum)
 She has an acute viral respiratory tract infection with sore throat, productive cough, and nasal congestion.  COVID and flu testing are negative.  Antibiotics are unnecessary. She should stay home from work for at least five days and may return if symptoms improve by Monday.  I recommended supportive care.  We talked about safe use of Tylenol  and ibuprofen as needed for body aches and sore throats.  She declines intranasal steroids.  We talked about rest, hydration.

## 2024-02-17 ENCOUNTER — Encounter (HOSPITAL_BASED_OUTPATIENT_CLINIC_OR_DEPARTMENT_OTHER): Payer: Self-pay

## 2024-02-17 ENCOUNTER — Ambulatory Visit: Payer: Self-pay

## 2024-02-17 ENCOUNTER — Emergency Department (HOSPITAL_BASED_OUTPATIENT_CLINIC_OR_DEPARTMENT_OTHER)

## 2024-02-17 ENCOUNTER — Other Ambulatory Visit: Payer: Self-pay

## 2024-02-17 ENCOUNTER — Emergency Department (HOSPITAL_BASED_OUTPATIENT_CLINIC_OR_DEPARTMENT_OTHER)
Admission: EM | Admit: 2024-02-17 | Discharge: 2024-02-17 | Disposition: A | Discharge status: Home / Self Care | Attending: Emergency Medicine | Admitting: Emergency Medicine

## 2024-02-17 DIAGNOSIS — J45909 Unspecified asthma, uncomplicated: Secondary | ICD-10-CM | POA: Diagnosis not present

## 2024-02-17 DIAGNOSIS — Z7984 Long term (current) use of oral hypoglycemic drugs: Secondary | ICD-10-CM | POA: Diagnosis not present

## 2024-02-17 DIAGNOSIS — E119 Type 2 diabetes mellitus without complications: Secondary | ICD-10-CM | POA: Diagnosis not present

## 2024-02-17 DIAGNOSIS — B9789 Other viral agents as the cause of diseases classified elsewhere: Secondary | ICD-10-CM | POA: Diagnosis not present

## 2024-02-17 DIAGNOSIS — Z91040 Latex allergy status: Secondary | ICD-10-CM | POA: Diagnosis not present

## 2024-02-17 DIAGNOSIS — R059 Cough, unspecified: Secondary | ICD-10-CM | POA: Diagnosis present

## 2024-02-17 DIAGNOSIS — R058 Other specified cough: Secondary | ICD-10-CM | POA: Diagnosis not present

## 2024-02-17 DIAGNOSIS — J069 Acute upper respiratory infection, unspecified: Secondary | ICD-10-CM | POA: Diagnosis not present

## 2024-02-17 LAB — BASIC METABOLIC PANEL WITH GFR
Anion gap: 9 (ref 5–15)
BUN: 15 mg/dL (ref 8–23)
CO2: 27 mmol/L (ref 22–32)
Calcium: 9.3 mg/dL (ref 8.9–10.3)
Chloride: 107 mmol/L (ref 98–111)
Creatinine, Ser: 0.92 mg/dL (ref 0.44–1.00)
GFR, Estimated: >60 mL/min (ref >60)
Glucose, Bld: 72 mg/dL (ref 70–99)
Potassium: 4.3 mmol/L (ref 3.5–5.1)
Sodium: 144 mmol/L (ref 135–145)

## 2024-02-17 LAB — RESP PANEL BY RT-PCR (RSV, FLU A&B, COVID)  RVPGX2
Influenza A by PCR: NEGATIVE
Influenza B by PCR: NEGATIVE
Resp Syncytial Virus by PCR: NEGATIVE
SARS Coronavirus 2 by RT PCR: NEGATIVE

## 2024-02-17 LAB — CBC WITH DIFFERENTIAL/PLATELET
Abs Immature Granulocytes: 0.03 K/uL (ref 0.00–0.07)
Basophils Absolute: 0.1 K/uL (ref 0.0–0.1)
Basophils Relative: 1 %
Eosinophils Absolute: 0.3 K/uL (ref 0.0–0.5)
Eosinophils Relative: 3 %
HCT: 33.6 % — ABNORMAL LOW (ref 36.0–46.0)
Hemoglobin: 10.7 g/dL — ABNORMAL LOW (ref 12.0–15.0)
Immature Granulocytes: 0 %
Lymphocytes Relative: 31 %
Lymphs Abs: 3.1 K/uL (ref 0.7–4.0)
MCH: 29.7 pg (ref 26.0–34.0)
MCHC: 31.8 g/dL (ref 30.0–36.0)
MCV: 93.3 fL (ref 80.0–100.0)
Monocytes Absolute: 1 K/uL (ref 0.1–1.0)
Monocytes Relative: 10 %
Neutro Abs: 5.7 K/uL (ref 1.7–7.7)
Neutrophils Relative %: 55 %
Platelets: 351 K/uL (ref 150–400)
RBC: 3.6 MIL/uL — ABNORMAL LOW (ref 3.87–5.11)
RDW: 15.6 % — ABNORMAL HIGH (ref 11.5–15.5)
WBC: 10.3 K/uL (ref 4.0–10.5)
nRBC: 0 % (ref 0.0–0.2)

## 2024-02-17 MED ORDER — DOXYCYCLINE HYCLATE 100 MG PO CAPS: 100.0000 mg | ORAL_CAPSULE | Freq: Two times a day (BID) | ORAL | 0 refills | Status: AC

## 2024-02-17 MED ORDER — BENZONATATE 100 MG PO CAPS: 100.0000 mg | ORAL_CAPSULE | Freq: Three times a day (TID) | ORAL | 0 refills | Status: AC

## 2024-02-17 NOTE — Telephone Encounter (Signed)
  FYI Only or Action Required?: FYI only for provider: ED advised.  Patient was last seen in primary care on 02/13/2024 by Jerrell Cleatus Ned, MD.  Called Nurse Triage reporting Cough.  Symptoms began a week ago.  Interventions attempted: Rest, hydration, or home remedies.  Symptoms are: gradually worsening.  Triage Disposition: Go to ED Now (or PCP Triage)  Patient/caregiver understands and will follow disposition?: Yes                           Copied from CRM [id number]. Topic: Clinical - Red Word Triage >> Feb 17, 2024  8:35 AM Berneda FALCON wrote: Red Word that prompted transfer to Nurse Triage: Came in last week saw Dr. Jerrell and was told she had an infetion and maybe bhronchitis. She states it is getting worse-when she coughs it hurts her chest and her cough is very bad. States he told her to just let it run its course, but this is very bad and wants to know what she should do now. States there is big chunks of green mucus, which is new.  The symptoms have been ongoing for over a week. Worse over the last 2 days. Also thinks she is dehydrated. Reason for Disposition . Patient sounds very sick or weak to the triager  Answer Assessment - Initial Assessment Questions Patient states she feels dehydrated--she states her lips are very dry  Patient had a lot of possible rhonchi noted over the phone when she was coughing and she sounded generally unwell She is hurting in her chest when she coughs Patient states she feels a lot worse and is having a lot of coughing and sounded short of breath on the phone with this RN She is advised that the ER is recommended at this time She states she will go to the ER  Patient is advised to call us  back if anything changes or with any further questions/concerns. Patient is advised that if anything worsens to go to the Emergency Room. Patient verbalized understanding.   1. ONSET: When did the cough begin?       2.  SEVERITY: How bad is the cough today?      continuous 3. SPUTUM: Describe the color of your sputum (e.g., none, dry cough; clear, white, yellow, green)     Thick & green 4. HEMOPTYSIS: Are you coughing up any blood? If Yes, ask: How much? (e.g., flecks, streaks, tablespoons, etc.)     denies 5. DIFFICULTY BREATHING: Are you having difficulty breathing? If Yes, ask: How bad is it? (e.g., mild, moderate, severe)      denies 6. FEVER: Do you have a fever? If Yes, ask: What is your temperature, how was it measured, and when did it start?     denies 7. CARDIAC HISTORY: Do you have any history of heart disease? (e.g., heart attack, congestive heart failure)      ----- 8. LUNG HISTORY: Do you have any history of lung disease?  (e.g., pulmonary embolus, asthma, emphysema)     asthma 9. PE RISK FACTORS: Do you have a history of blood clots? (or: recent major surgery, recent prolonged travel, bedridden)     denies 10. OTHER SYMPTOMS: Do you have any other symptoms? (e.g., runny nose, wheezing, chest pain)    continuous  Protocols used: Cough - Acute Productive-A-AH

## 2024-02-17 NOTE — ED Triage Notes (Signed)
 Reports continued cough, congestion, fatigue for 1 week. Dx w bronchitis and viral URI last week.

## 2024-02-17 NOTE — Discharge Instructions (Addendum)
 Your symptoms are likely due to a virus, however you may benefit from a short course of antibiotics in the event that you are developing an atypical pneumonia that is not evident on chest x-ray yet. Start doxycycline, 1 tab by mouth twice daily for 5 days.  Start Tessalon , 1 capsule by mouth every 8 hours as needed for cough.  You may continue other over-the-counter agents for relief of your symptoms, like Tylenol  for headache or bodyaches, Flonase  for nasal congestion, and Delsym or Robitussin for cough.  Follow-up with your PCP this week.  Return to the emergency department if your symptoms worsen.

## 2024-02-17 NOTE — ED Provider Notes (Signed)
 [place] [facility] AT [facility] Provider Note   CSN: [id number] Arrival date & time: 02/17/24  1035     Patient presents with: Cough   Tracey Evans is a 78 y.o. female.   78 year old female presenting with worsening productive cough, congestion.  Patient notes that her symptoms initially began Thursday, she was seen and evaluated by her PCP who told her that her symptoms were likely secondary to an viral upper respiratory infection and did not require antibiotics at that time.  Patient is concerned because her cough is progressively worsening, with thick mucus production.  She also has worsening nasal congestion.  She has been using Delsym cough with minimal relief.  She denies fever but reports a history of bronchitis.  No history of COPD but does have a history of asthma, does not currently use an inhaler.  Reports chest pain only during episodes of coughing, none at rest/currently.  Denies shortness of breath, sore throat, ear pain, worsening lower extremity edema, nausea/vomiting/abdominal pain.   Cough      Prior to Admission medications   Medication Sig Start Date End Date Taking? Authorizing Provider  amLODipine  (NORVASC ) 5 MG tablet Take 5 mg by mouth daily. 10/25/23   [provider]  atorvastatin  (LIPITOR) 20 MG tablet TAKE 1 TABLET BY MOUTH EVERY DAY 12/02/23   Tabori, Katherine E, MD  buPROPion  (WELLBUTRIN  XL) 300 MG 24 hr tablet TAKE 1 TABLET BY MOUTH EVERY DAY 10/03/23   Tabori, Katherine E, MD  citalopram  (CELEXA ) 20 MG tablet Take 1 tablet (20 mg total) by mouth daily. 01/31/24   Tabori, Katherine E, MD  gabapentin  (NEURONTIN ) 300 MG capsule TAKE 1 CAPSULE IN THE MORNING , 1 IN AFTERNOON AND 2 CAPSULES BEFORE BED 09/03/23   Tabori, Katherine E, MD  levothyroxine  (SYNTHROID ) 75 MCG tablet TAKE 1 TABLET BY MOUTH EVERY DAY BEFORE BREAKFAST 09/04/23   Tabori, Katherine E, MD  losartan (COZAAR) 100 MG tablet Take 100 mg by mouth daily.  08/23/23   [provider]  omeprazole  (PRILOSEC) 20 MG capsule TAKE 1 CAPSULE BY MOUTH EVERY DAY 09/04/23   Tabori, Katherine E, MD  polyethylene glycol powder (MIRALAX ) 17 GM/SCOOP powder Take 17 g by mouth 2 (two) times daily as needed for mild constipation. 10/03/22   Gonfa, Taye T, MD  tizanidine  (ZANAFLEX ) 2 MG capsule Take 2 mg by mouth 3 (three) times daily as needed. 07/12/23   [provider]    Allergies: Contrast media [iodinated contrast media], Dilaudid  [hydromorphone  hcl], Adhesive [tape], Codeine , Lactose intolerance (gi), Latex, Nsaids, and Lyrica  [pregabalin ]    Review of Systems  Respiratory:  Positive for cough.     Updated Vital Signs  Vitals:   02/17/24 1045  BP: 120/82  Pulse: 77  Resp: 18  Temp: 97.7 F (36.5 C)  TempSrc: Oral  SpO2: 97%     Physical Exam Vitals and nursing note reviewed.  HENT:     Head: Normocephalic.     Nose: Congestion present.  Eyes:     Extraocular Movements: Extraocular movements intact.  Cardiovascular:     Rate and Rhythm: Normal rate and regular rhythm.     Heart sounds: Murmur heard.  Pulmonary:     Effort: Pulmonary effort is normal. No respiratory distress.     Breath sounds: Normal breath sounds. No wheezing, rhonchi or rales.     Comments: Speaking in full/clear sentences without evidence of respiratory distress Musculoskeletal:     Cervical back: Normal  range of motion.     Comments: Moves all extremities spontaneously without difficulty  Skin:    General: Skin is warm and dry.  Neurological:     Mental Status: She is alert and oriented to person, place, and time.     (all labs ordered are listed, but only abnormal results are displayed) Labs Reviewed  CBC WITH DIFFERENTIAL/PLATELET - Abnormal; Notable for the following components:      Result Value   RBC 3.60 (*)    Hemoglobin 10.7 (*)    HCT 33.6 (*)    RDW 15.6 (*)    All other components within normal limits  RESP PANEL BY RT-PCR (RSV,  FLU A&B, COVID)  [id number]  BASIC METABOLIC PANEL WITH GFR    EKG: None  Radiology: DG Chest 2 View Result Date: 02/17/2024 CLINICAL DATA:  Productive cough. EXAM: CHEST - 2 VIEW COMPARISON:  06/08/2018 FINDINGS: The lungs are clear without focal pneumonia, edema, pneumothorax or pleural effusion. The cardiopericardial silhouette is within normal limits for size. No acute bony abnormality. IMPRESSION: No active cardiopulmonary disease. Electronically Signed   By: Camellia Candle M.D.   On: 02/17/2024 12:47     Procedures   Medications Ordered in the ED - No data to display                                  Medical Decision Making This patient presents to the ED for concern of cough and congestion, this involves an extensive number of treatment options, and is a complaint that carries with it a high risk of complications and morbidity.  The differential diagnosis includes COVID/flu/RSV, other viral URI, bronchitis, pneumonia, asthma exacerbation   Co morbidities that complicate the patient evaluation  Asthma, hyperlipidemia, type 2 diabetes   Additional history obtained:  Additional history obtained from record review External records from outside source obtained and reviewed including PCP note from last week   Lab Tests:  I Ordered, and personally interpreted labs.  The pertinent results include: CBC notable for hemoglobin of 10.7, this is a drop from her most recent baseline of 12.2 however she has been similarly anemic in the past.  BMP within normal limits.  Respiratory panel negative.   Imaging Studies ordered:  I ordered imaging studies including CXR  I independently visualized and interpreted imaging which showed No active cardiopulmonary disease.  I agree with the radiologist interpretation   Cardiac Monitoring: / EKG:  The patient was maintained on a cardiac monitor.  I personally viewed and interpreted the cardiac monitored which showed an underlying rhythm of:  NSR   Problem List / ED Course / Critical interventions / Medication management I have reviewed the patients home medicines and have made adjustments as needed   Social Determinants of Health:  Insufficient physical activity   Test / Admission - Considered:  Physical exam is reassuring as above, patient does have a cough however her lungs are clear to auscultation bilaterally without adventitious sounds, she is not demonstrating any signs or symptoms consistent with respiratory distress.  Chest x-ray is reassuring as above, labs are without leukocytosis, respiratory panel negative, vitals are stable without fever or tachycardia she maintains her oxygen saturation on room air without difficulty.  Given patient's age and concern for possible atypical pneumonia, will start course of doxycycline and recommend close PCP follow-up later this week to assess for symptomatic improvement.  I recommend that she continue  over-the-counter agents as well, decongestants like Flonase  and cough suppressants like Delsym or Robitussin.  I will also prescribe Tessalon  to be used as needed for cough.  Patient voiced understanding is in agreement this plan, return precautions discussed, she is appropriate for discharge at this time.  Staffed with Dr. Freddi  Amount and/or Complexity of Data Reviewed Labs: ordered. Radiology: ordered.  Risk Prescription drug management.        Final diagnoses:  Viral URI with cough    ED Discharge Orders          Ordered    doxycycline (VIBRAMYCIN) 100 MG capsule  2 times daily        02/17/24 1303    benzonatate  (TESSALON ) 100 MG capsule  Every 8 hours        02/17/24 1303               Glendia Rocky SAILOR, PA-C 02/17/24 1304    Freddi Glendia, MD 02/17/24 1401

## 2024-03-03 ENCOUNTER — Emergency Department (HOSPITAL_COMMUNITY)
Admission: EM | Admit: 2024-03-03 | Discharge: 2024-03-03 | Disposition: A | Discharge status: Home / Self Care | Attending: Emergency Medicine | Admitting: Emergency Medicine

## 2024-03-03 ENCOUNTER — Emergency Department (EMERGENCY_DEPARTMENT_HOSPITAL): Admit: 2024-03-03 | Discharge: 2024-03-03 | Disposition: A | Discharge status: Home / Self Care

## 2024-03-03 ENCOUNTER — Encounter (HOSPITAL_COMMUNITY): Payer: Self-pay | Admitting: Radiology

## 2024-03-03 ENCOUNTER — Emergency Department (HOSPITAL_COMMUNITY)

## 2024-03-03 ENCOUNTER — Other Ambulatory Visit: Payer: Self-pay

## 2024-03-03 DIAGNOSIS — M48061 Spinal stenosis, lumbar region without neurogenic claudication: Secondary | ICD-10-CM | POA: Diagnosis not present

## 2024-03-03 DIAGNOSIS — J45909 Unspecified asthma, uncomplicated: Secondary | ICD-10-CM | POA: Diagnosis not present

## 2024-03-03 DIAGNOSIS — Z7984 Long term (current) use of oral hypoglycemic drugs: Secondary | ICD-10-CM | POA: Diagnosis not present

## 2024-03-03 DIAGNOSIS — Z79899 Other long term (current) drug therapy: Secondary | ICD-10-CM | POA: Diagnosis not present

## 2024-03-03 DIAGNOSIS — E119 Type 2 diabetes mellitus without complications: Secondary | ICD-10-CM | POA: Insufficient documentation

## 2024-03-03 DIAGNOSIS — Z91040 Latex allergy status: Secondary | ICD-10-CM | POA: Diagnosis not present

## 2024-03-03 DIAGNOSIS — Z853 Personal history of malignant neoplasm of breast: Secondary | ICD-10-CM | POA: Insufficient documentation

## 2024-03-03 DIAGNOSIS — M4726 Other spondylosis with radiculopathy, lumbar region: Secondary | ICD-10-CM | POA: Insufficient documentation

## 2024-03-03 DIAGNOSIS — R609 Edema, unspecified: Secondary | ICD-10-CM | POA: Diagnosis not present

## 2024-03-03 DIAGNOSIS — M79604 Pain in right leg: Secondary | ICD-10-CM | POA: Diagnosis not present

## 2024-03-03 DIAGNOSIS — Z7951 Long term (current) use of inhaled steroids: Secondary | ICD-10-CM | POA: Insufficient documentation

## 2024-03-03 DIAGNOSIS — M47816 Spondylosis without myelopathy or radiculopathy, lumbar region: Secondary | ICD-10-CM | POA: Diagnosis not present

## 2024-03-03 DIAGNOSIS — M7989 Other specified soft tissue disorders: Secondary | ICD-10-CM | POA: Diagnosis not present

## 2024-03-03 DIAGNOSIS — I10 Essential (primary) hypertension: Secondary | ICD-10-CM | POA: Insufficient documentation

## 2024-03-03 DIAGNOSIS — Z01810 Encounter for preprocedural cardiovascular examination: Secondary | ICD-10-CM | POA: Diagnosis not present

## 2024-03-03 MED ORDER — TRAMADOL HCL 50 MG PO TABS: 50.0000 mg | ORAL_TABLET | Freq: Four times a day (QID) | ORAL | 0 refills | Status: DC | PRN

## 2024-03-03 MED ORDER — TRAMADOL HCL 50 MG PO TABS
100.0000 mg | ORAL_TABLET | Freq: Once | ORAL | Status: AC
  Administered 2024-03-03: 100 mg via ORAL
  Filled 2024-03-03: qty 2

## 2024-03-03 NOTE — Discharge Instructions (Signed)
 As discussed, please follow-up with your primary care provider for continued evaluation of your chronic back condition, would also recommend possible referral to neurosurgery if this continues to be an issue.

## 2024-03-03 NOTE — ED Triage Notes (Addendum)
 Patient c/o  right knee spams that shoot all the way up to her hip. Hx arthritis. Patient is to have a knee replacement on December 9. Patient took tizanidine  at home with no relief. Pain reported pain 12/10.

## 2024-03-03 NOTE — ED Provider Notes (Signed)
  Physical Exam  BP 114/72   Pulse 68   Temp 97.6 F (36.4 C) (Oral)   Resp 17   SpO2 91%   Physical Exam  Procedures  Procedures  ED Course / MDM    Medical Decision Making Amount and/or Complexity of Data Reviewed Radiology: ordered.  Risk Prescription drug management.  Care assumed from L.Beverley, PA-C.  Tracey Evans is a 78 y.o. female, presented to the ED for right lower extremity pain.  Had previously been on diclofenac for chronic pain and is scheduled for right TKR on 9 December.  Complaining of severe spasms and pain in the posterior knee, calf.  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  At this time patient is pending venous ultrasound of the right lower extremity to exclude DVT.  Findings of DVT study are as follows:   Summary:  RIGHT:          - There is no evidence of deep vein thrombosis in the lower extremity.     - No cystic structure found in the popliteal fossa.     LEFT:  - No evidence of common femoral vein obstruction.                  *See table(s) above for measurements and observations.    Electronically signed by Lonni Gaskins MD on 03/03/2024 at 9:51:57 AM.   Given the reassuring findings of the DVT study, plan at this time is to discharge with outpatient follow-up to primary care for likely sciatic related pain.  The care plan was discussed with the patient and she understands agrees has no further concerns at this time.  Will send in prescription for continued Ultram  to be used in conjunction with her other medications, and with plans to follow-up with her primary care provider, consider possible referral to neurosurgery for evaluation of her spine.  Otherwise, as vital signs are remained stable and there are no concerning findings on the  physical evaluation or the workup, will discharge patient with outpatient follow-up as previously discussed   Myriam Dorn BROCKS, PA 03/03/24 0957    Ruthe Cornet, DO 03/03/24 1034

## 2024-03-03 NOTE — ED Provider Notes (Signed)
  [facility] AT [facility] Provider Note   CSN: [id number] Arrival date & time: 03/03/24  9570     Patient presents with: Knee Pain   Tracey Evans is a 78 y.o. female.   78 year old female presents with complaint of pain in her right leg. Scheduled for right knee replacement 12/9 with Dr. Ernie. Has been weaning off her diclofenac ahead of surgery, unsure if related. Reports severe spasms/pain in her right lower leg laterally and posterior knee. With swelling in the leg more so than usual. No falls or injuries. No back pain, no other complaints or concerns.        Prior to Admission medications   Medication Sig Start Date End Date Taking? Authorizing Provider  amLODipine  (NORVASC ) 5 MG tablet Take 5 mg by mouth daily. 10/25/23   [provider]  atorvastatin  (LIPITOR) 20 MG tablet TAKE 1 TABLET BY MOUTH EVERY DAY 12/02/23   Tabori, Katherine E, MD  benzonatate  (TESSALON ) 100 MG capsule Take 1 capsule (100 mg total) by mouth every 8 (eight) hours. Patient not taking: Reported on 03/02/2024 02/17/24   Glendia Rocky SAILOR, PA-C  buPROPion  (WELLBUTRIN  XL) 300 MG 24 hr tablet TAKE 1 TABLET BY MOUTH EVERY DAY 10/03/23   Tabori, Katherine E, MD  citalopram  (CELEXA ) 20 MG tablet Take 1 tablet (20 mg total) by mouth daily. 01/31/24   Tabori, Katherine E, MD  diclofenac (VOLTAREN) 75 MG EC tablet Take 75 mg by mouth 2 (two) times daily. 02/11/24   [provider]  gabapentin  (NEURONTIN ) 300 MG capsule TAKE 1 CAPSULE IN THE MORNING , 1 IN AFTERNOON AND 2 CAPSULES BEFORE BED 09/03/23   Tabori, Katherine E, MD  levothyroxine  (SYNTHROID ) 75 MCG tablet TAKE 1 TABLET BY MOUTH EVERY DAY BEFORE BREAKFAST 09/04/23   Tabori, Katherine E, MD  losartan (COZAAR) 100 MG tablet Take 100 mg by mouth daily. 08/23/23   [provider]  omeprazole  (PRILOSEC) 20 MG capsule TAKE 1 CAPSULE BY MOUTH EVERY DAY 09/04/23   Tabori, Katherine E, MD  polyethylene glycol  powder (MIRALAX ) 17 GM/SCOOP powder Take 17 g by mouth 2 (two) times daily as needed for mild constipation. 10/03/22   Gonfa, Taye T, MD  Propylene Glycol, PF, (SYSTANE COMPLETE PF) 0.6 % SOLN Place 1 drop into the right eye daily as needed (Eye irritation).    [provider]  tizanidine  (ZANAFLEX ) 2 MG capsule Take 2 mg by mouth 2 (two) times daily as needed for muscle spasms. 07/12/23   [provider]    Allergies: Contrast media [iodinated contrast media], Dilaudid  [hydromorphone  hcl], Adhesive [tape], Codeine , Lactose intolerance (gi), Latex, Nsaids, and Lyrica  [pregabalin ]    Review of Systems Negative except as per HPI Updated Vital Signs BP 121/74 (BP Location: Left Arm)   Pulse 76   Temp 97.6 F (36.4 C) (Oral)   Resp 18   SpO2 99%   Physical Exam Vitals and nursing note reviewed.  Constitutional:      General: She is not in acute distress.    Appearance: She is well-developed. She is not diaphoretic.  HENT:     Head: Normocephalic and atraumatic.  Cardiovascular:     Pulses: Normal pulses.  Pulmonary:     Effort: Pulmonary effort is normal.  Abdominal:     Palpations: Abdomen is soft.     Tenderness: There is no abdominal tenderness.  Musculoskeletal:        General: Swelling and tenderness present.  No deformity.       Back:       Legs:  Skin:    General: Skin is warm and dry.     Findings: No erythema or rash.  Neurological:     Mental Status: She is alert and oriented to person, place, and time.     Sensory: No sensory deficit.  Psychiatric:        Behavior: Behavior normal.     (all labs ordered are listed, but only abnormal results are displayed) Labs Reviewed - No data to display  EKG: None  Radiology: CT Lumbar Spine Wo Contrast Result Date: 03/03/2024 CLINICAL DATA:  Low back pain. EXAM: CT LUMBAR SPINE WITHOUT CONTRAST TECHNIQUE: Multidetector CT imaging of the lumbar spine was performed without intravenous contrast  administration. Multiplanar CT image reconstructions were also generated. RADIATION DOSE REDUCTION: This exam was performed according to the departmental dose-optimization program which includes automated exposure control, adjustment of the mA and/or kV according to patient size and/or use of iterative reconstruction technique. COMPARISON:  None Available. FINDINGS: Segmentation: 5 lumbar type vertebrae. Alignment: Normal. Vertebrae: No acute fracture or focal pathologic process. Paraspinal and other soft tissues: 2.7 cm water density lesion upper pole left kidney has been present since at least 2018 when it measured 1.7 cm, compatible with simple cyst. No followup imaging is recommended. 17 mm posterior left upper pole renal lesion also present since 2018 compatible with a cyst. There is mild atherosclerotic calcification of the abdominal aorta without aneurysm. Disc levels: Moderate to severe central canal stenosis at L3-4 due to posterior broad-based bulging disc and substantial facet overgrowth with thickening of the ligamentum flavum. Associated foraminal encroachment. 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 Moderate to advanced central canal stenosis evident L4-5 with probable foraminal encroachment. IMPRESSION: 1. No evidence for an acute or chronic lumbar spine fracture. 2. 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 3. Diffuse facet osteoarthritis in the lumbar spine bilaterally. 4. Lumbar spine MRI follow-up recommended. Electronically Signed   By: Camellia Candle M.D.   On: 03/03/2024 06:11     Procedures   Medications Ordered in the ED  traMADol  (ULTRAM ) tablet 100 mg (100 mg Oral Given 03/03/24 0502)                                    Medical Decision Making Amount and/or Complexity of Data Reviewed Radiology: ordered.  Risk Prescription drug management.   This patient presents to the ED for concern of right leg  pain, this involves an extensive number of treatment options, and is a complaint that carries with it a high risk of complications and morbidity.  The differential diagnosis includes but not limited to lumbar radiculopathy, DVT, muscle spasm, arthritis    Co morbidities / Chronic conditions that complicate the patient evaluation  HLD, OA, HTN, GERD, breast CA, DM, asthma    Additional history obtained:  Additional history obtained from EMR External records from outside source obtained and reviewed including prior imaging on file   Imaging Studies ordered:  I ordered imaging studies including CT L-spine, venous doppler right lower extremity   I independently visualized and interpreted imaging which showed lumbar spine imaging with osteoarthritis.  Doppler study of the right lower extremity pending at time of signout oncoming provider. I agree with the radiologist interpretation   Problem List / ED Course / Critical  interventions / Medication management  78 year old female with complaint of pain in her right lower leg described as a cramping sensation.  Located posterior right knee as well as right lateral lower leg.  She is found to have tenderness to these areas as well as swelling in the right lower leg and midline tenderness in her lower back with right paraspinous tenderness.  CT of the lumbar spine shows arthritic changes.  Recommend follow-up outpatient for further imaging.  Care signed out at change of shift pending vascular ultrasound of right lower leg. I ordered medication including tramadol    I have reviewed the patients home medicines and have made adjustments as needed   Consultations Obtained:  I requested consultation with the ER attending, Dr. Jerral,  and discussed lab and imaging findings as well as pertinent plan - they recommend: agrees with plan of care   Social Determinants of Health:  Lives at home   Test / Admission - Considered:  Disposition pending  at time of signout from prior      Final diagnoses:  Right leg pain  Osteoarthritis of spine with radiculopathy, lumbar region    ED Discharge Orders     None          Beverley Leita LABOR, PA-C 03/03/24 9362    Jerral Meth, MD 03/03/24 [phone]

## 2024-03-03 NOTE — Progress Notes (Addendum)
 Anesthesia Review:  PCP comer Greet preop exam on 01/31/24  clearance in Media Tab dated  02/03/24  Cardiologist : DR Levern - Called and requested most recent EKG and ov note.  They are to fax. Requested on 03/10/24.  LOV 01/24/24 in Media   PPM/ ICD: Device Orders: Rep Notified:  Chest x-ray : EKG : 03/10/2024  Echo :2018 Stress test: 2020 - in Media Tab  Cardiac Cath :   Activity level: can do a flight of stairs without difficutly  Sleep Study/ CPAP : none  Fasting Blood Sugar :      / Checks Blood Sugar -- times a day:     DM- diet controlled on no meds  Does not check gucose at home  Hgba1c-03/10/24-5.9    Blood Thinner/ Instructions /Last Dose: ASA / Instructions/ Last Dose :    03/03/24- ED right leg pain- DVT checked  02/17/2024- Virual URI placed on Doxycycline   Resp panel negative in epic    02/17/24- cbc, bmp and resp panel - in ED - PT denies any symptoms at preop appt on 03/10/24.    Daughter with pt at preop appt.    Previous hx of broncho spasms with foot surgery in 2009 - noted in hx needs inhaler or nebulizer preop in hx    BMp results on 03/10/2024- glucose-66 - Called and LVMM for pt and daughter.  Routed BMP results to DR [facility]  Daughter called right back and mother is fine and they were going to eat now.  Harlene Neomi Barre made aware on 03/10/24. Called pt back and pt had eaten and spoke with daughter and she is doing fine.

## 2024-03-09 NOTE — Patient Instructions (Signed)
 SURGICAL WAITING ROOM VISITATION  Patients having surgery or a procedure may have no more than 2 support people in the waiting area - these visitors may rotate.    Children under the age of 55 must have an adult with them who is not the patient.  Visitors with respiratory illnesses are discouraged from visiting and should remain at home.  If the patient needs to stay at the hospital during part of their recovery, the visitor guidelines for inpatient rooms apply. Pre-op nurse will coordinate an appropriate time for 1 support person to accompany patient in pre-op.  This support person may not rotate.    Please refer to the [facility] website for the visitor guidelines for Inpatients (after your surgery is over and you are in a regular room).       Your procedure is scheduled on:  03/17/2024    Report to [facility] Main Entrance    Report to admitting at  1045 AM   Call this number if you have problems the morning of surgery [phone]   Do not eat food :After Midnight.   After Midnight you may have the following liquids until _ 1015_____ AM  DAY OF SURGERY  Water Non-Citrus Juices (without pulp, NO RED-Apple, White grape, White cranberry) Black Coffee (NO MILK/CREAM OR CREAMERS, sugar ok)  Clear Tea (NO MILK/CREAM OR CREAMERS, sugar ok) regular and decaf                             Plain Jell-O (NO RED)                                           Fruit ices (not with fruit pulp, NO RED)                                     Popsicles (NO RED)                                                               Sports drinks like Gatorade (NO RED)                      The day of surgery:  Drink ONE (1) Pre-Surgery Clear Ensure or G2 at 1015 AM the morning of surgery. Drink in one sitting. Do not sip.  This drink was given to you during your hospital  pre-op appointment visit. Nothing else to drink after completing the  Pre-Surgery Clear Ensure or G2.          If you have  questions, please contact your surgeon's office.       Oral Hygiene is also important to reduce your risk of infection.                                    Remember - BRUSH YOUR TEETH THE MORNING OF SURGERY WITH YOUR REGULAR TOOTHPASTE  DENTURES WILL BE REMOVED PRIOR TO SURGERY PLEASE DO NOT APPLY Poly  grip OR ADHESIVES!!!   Do NOT smoke after Midnight   Stop all vitamins and herbal supplements 7 days before surgery.   Take these medicines the morning of surgery with A SIP OF WATER:  amlodipine , wellbutrin , celexa , gabapenitn, synthroid , omeprazole    DO NOT TAKE ANY ORAL DIABETIC MEDICATIONS DAY OF YOUR SURGERY  Bring CPAP mask and tubing day of surgery.                              You may not have any metal on your body including hair pins, jewelry, and body piercing             Do not wear make-up, lotions, powders, perfumes/cologne, or deodorant  Do not wear nail polish including gel and S&S, artificial/acrylic nails, or any other type of covering on natural nails including finger and toenails. If you have artificial nails, gel coating, etc. that needs to be removed by a nail salon please have this removed prior to surgery or surgery may need to be canceled/ delayed if the surgeon/ anesthesia feels like they are unable to be safely monitored.   Do not shave  48 hours prior to surgery.               Men may shave face and neck.   Do not bring valuables to the hospital. [facility] IS NOT             RESPONSIBLE   FOR VALUABLES.   Contacts, glasses, dentures or bridgework may not be worn into surgery.   Bring small overnight bag day of surgery.   DO NOT BRING YOUR HOME MEDICATIONS TO THE HOSPITAL. PHARMACY WILL DISPENSE MEDICATIONS LISTED ON YOUR MEDICATION LIST TO YOU DURING YOUR ADMISSION IN THE HOSPITAL!    Patients discharged on the day of surgery will not be allowed to drive home.  Someone NEEDS to stay with you for the first 24 hours after anesthesia.   Special  Instructions: Bring a copy of your healthcare power of attorney and living will documents the day of surgery if you haven't scanned them before.              Please read over the following fact sheets you were given: IF YOU HAVE QUESTIONS ABOUT YOUR PRE-OP INSTRUCTIONS PLEASE CALL [phone].   If you received a COVID test during your pre-op visit  it is requested that you wear a mask when out in public, stay away from anyone that may not be feeling well and notify your surgeon if you develop symptoms. If you test positive for Covid or have been in contact with anyone that has tested positive in the last 10 days please notify you surgeon.      Pre-operative 4 CHG Bath Instructions   You can play a key role in reducing the risk of infection after surgery. Your skin needs to be as free of germs as possible. You can reduce the number of germs on your skin by washing with CHG (chlorhexidine  gluconate) soap before surgery. CHG is an antiseptic soap that kills germs and continues to kill germs even after washing.   DO NOT use if you have an allergy to chlorhexidine /CHG or antibacterial soaps. If your skin becomes reddened or irritated, stop using the CHG and notify one of our RNs at [phone].   Please shower with the CHG soap starting 4 days before surgery using the following schedule:  Please keep in mind the following:  DO NOT shave, including legs and underarms, starting the day of your first shower.   You may shave your face at any point before/day of surgery.  Place clean sheets on your bed the day you start using CHG soap. Use a clean washcloth (not used since being washed) for each shower. DO NOT sleep with pets once you start using the CHG.   CHG Shower Instructions:  If you choose to wash your hair and private area, wash first with your normal shampoo/soap.  After you use shampoo/soap, rinse your hair and body thoroughly to remove shampoo/soap residue.  Turn the water OFF and  apply about 3 tablespoons (45 ml) of CHG soap to a CLEAN washcloth.  Apply CHG soap ONLY FROM YOUR NECK DOWN TO YOUR TOES (washing for 3-5 minutes)  DO NOT use CHG soap on face, private areas, open wounds, or sores.  Pay special attention to the area where your surgery is being performed.  If you are having back surgery, having someone wash your back for you may be helpful. Wait 2 minutes after CHG soap is applied, then you may rinse off the CHG soap.  Pat dry with a clean towel  Put on clean clothes/pajamas   If you choose to wear lotion, please use ONLY the CHG-compatible lotions on the back of this paper.     Additional instructions for the day of surgery: DO NOT APPLY any lotions, deodorants, cologne, or perfumes.   Put on clean/comfortable clothes.  Brush your teeth.  Ask your nurse before applying any prescription medications to the skin.      CHG Compatible Lotions   Aveeno Moisturizing lotion  Cetaphil Moisturizing Cream  Cetaphil Moisturizing Lotion  Clairol Herbal Essence Moisturizing Lotion, Dry Skin  Clairol Herbal Essence Moisturizing Lotion, Extra Dry Skin  Clairol Herbal Essence Moisturizing Lotion, Normal Skin  Curel Age Defying Therapeutic Moisturizing Lotion with Alpha Hydroxy  Curel Extreme Care Body Lotion  Curel Soothing Hands Moisturizing Hand Lotion  Curel Therapeutic Moisturizing Cream, Fragrance-Free  Curel Therapeutic Moisturizing Lotion, Fragrance-Free  Curel Therapeutic Moisturizing Lotion, Original Formula  Eucerin Daily Replenishing Lotion  Eucerin Dry Skin Therapy Plus Alpha Hydroxy Crme  Eucerin Dry Skin Therapy Plus Alpha Hydroxy Lotion  Eucerin Original Crme  Eucerin Original Lotion  Eucerin Plus Crme Eucerin Plus Lotion  Eucerin TriLipid Replenishing Lotion  Keri Anti-Bacterial Hand Lotion  Keri Deep Conditioning Original Lotion Dry Skin Formula Softly Scented  Keri Deep Conditioning Original Lotion, Fragrance Free Sensitive Skin  Formula  Keri Lotion Fast Absorbing Fragrance Free Sensitive Skin Formula  Keri Lotion Fast Absorbing Softly Scented Dry Skin Formula  Keri Original Lotion  Keri Skin Renewal Lotion Keri Silky Smooth Lotion  Keri Silky Smooth Sensitive Skin Lotion  Nivea Body Creamy Conditioning Oil  Nivea Body Extra Enriched Teacher, Adult Education Moisturizing Lotion Nivea Crme  Nivea Skin Firming Lotion  NutraDerm 30 Skin Lotion  NutraDerm Skin Lotion  NutraDerm Therapeutic Skin Cream  NutraDerm Therapeutic Skin Lotion  ProShield Protective Hand Cream  Provon moisturizing lotion

## 2024-03-10 ENCOUNTER — Encounter (HOSPITAL_COMMUNITY): Payer: Self-pay

## 2024-03-10 ENCOUNTER — Encounter (HOSPITAL_COMMUNITY)
Admission: RE | Admit: 2024-03-10 | Discharge: 2024-03-10 | Disposition: A | Source: Ambulatory Visit | Attending: Orthopedic Surgery

## 2024-03-10 ENCOUNTER — Other Ambulatory Visit: Payer: Self-pay

## 2024-03-10 VITALS — BP 119/79 | HR 66 | Temp 98.5°F | Resp 16 | Ht 64.5 in | Wt 157.0 lb

## 2024-03-10 DIAGNOSIS — M1711 Unilateral primary osteoarthritis, right knee: Secondary | ICD-10-CM | POA: Diagnosis not present

## 2024-03-10 DIAGNOSIS — I2510 Atherosclerotic heart disease of native coronary artery without angina pectoris: Secondary | ICD-10-CM | POA: Diagnosis not present

## 2024-03-10 DIAGNOSIS — E039 Hypothyroidism, unspecified: Secondary | ICD-10-CM | POA: Diagnosis not present

## 2024-03-10 DIAGNOSIS — J45909 Unspecified asthma, uncomplicated: Secondary | ICD-10-CM | POA: Insufficient documentation

## 2024-03-10 DIAGNOSIS — E119 Type 2 diabetes mellitus without complications: Secondary | ICD-10-CM | POA: Insufficient documentation

## 2024-03-10 DIAGNOSIS — F419 Anxiety disorder, unspecified: Secondary | ICD-10-CM | POA: Diagnosis not present

## 2024-03-10 DIAGNOSIS — Z01818 Encounter for other preprocedural examination: Secondary | ICD-10-CM | POA: Diagnosis not present

## 2024-03-10 DIAGNOSIS — D649 Anemia, unspecified: Secondary | ICD-10-CM | POA: Insufficient documentation

## 2024-03-10 DIAGNOSIS — K219 Gastro-esophageal reflux disease without esophagitis: Secondary | ICD-10-CM | POA: Insufficient documentation

## 2024-03-10 DIAGNOSIS — I10 Essential (primary) hypertension: Secondary | ICD-10-CM | POA: Diagnosis not present

## 2024-03-10 LAB — BASIC METABOLIC PANEL WITH GFR
Anion gap: 10 (ref 5–15)
BUN: 18 mg/dL (ref 8–23)
CO2: 25 mmol/L (ref 22–32)
Calcium: 9.6 mg/dL (ref 8.9–10.3)
Chloride: 107 mmol/L (ref 98–111)
Creatinine, Ser: 0.92 mg/dL (ref 0.44–1.00)
GFR, Estimated: >60 mL/min (ref >60)
Glucose, Bld: 66 mg/dL — ABNORMAL LOW (ref 70–99)
Potassium: 3.9 mmol/L (ref 3.5–5.1)
Sodium: 142 mmol/L (ref 135–145)

## 2024-03-10 LAB — CBC
HCT: 37.5 % (ref 36.0–46.0)
Hemoglobin: 11.7 g/dL — ABNORMAL LOW (ref 12.0–15.0)
MCH: 29.2 pg (ref 26.0–34.0)
MCHC: 31.2 g/dL (ref 30.0–36.0)
MCV: 93.5 fL (ref 80.0–100.0)
Platelets: 399 K/uL (ref 150–400)
RBC: 4.01 MIL/uL (ref 3.87–5.11)
RDW: 14.6 % (ref 11.5–15.5)
WBC: 8 K/uL (ref 4.0–10.5)
nRBC: 0 % (ref 0.0–0.2)

## 2024-03-10 LAB — SURGICAL PCR SCREEN
MRSA, PCR: NEGATIVE
Staphylococcus aureus: NEGATIVE

## 2024-03-11 ENCOUNTER — Other Ambulatory Visit: Payer: Self-pay | Admitting: Family Medicine

## 2024-03-11 DIAGNOSIS — K219 Gastro-esophageal reflux disease without esophagitis: Secondary | ICD-10-CM

## 2024-03-11 DIAGNOSIS — E039 Hypothyroidism, unspecified: Secondary | ICD-10-CM

## 2024-03-11 NOTE — Progress Notes (Signed)
 Case: [id number] Date/Time: 03/17/24 1145   Procedure: ARTHROPLASTY, KNEE, TOTAL (Right: Knee)   Anesthesia type: Spinal   Diagnosis: Primary osteoarthritis of right knee [M17.11]   Pre-op diagnosis: Right knee osteoarthritis   Location: [facility] ROOM 09 / [facility]   Surgeons: Ernie Cough, MD       DISCUSSION: Tracey Evans is a 78 yo female with PMH of HTN, CAD, asthma, PUD, GERD, hypothyroid, T2DM, anxiety, anemia, hx of breast cancer s/p right mastectomy (2017)  Prior anesthesia complication includes bronchospasm. Pt reports need for inhaler or nebulizer in pre op  Patient follows with Cardiology (Dr. Levern). 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 Stress testing in 2020 was low risk, without ischemia. Echo in 2018 was grossly WNL. Last seen on 10/17 in clinic. Stable at that visit. Per Dr. Levern: Patient is acceptable risk for right total knee replacement from cardiac point of view:  Patient seen by PCP for pre op clearance on 10/24. Per Dr. Mahlon:  Plan 1. Preoperative workup as follows none. 2. Change in medication regimen before surgery: none, continue medication regimen including morning of surgery, with sip of water. 3. Prophylaxis for cardiac events with perioperative beta-blockers: not indicated. 4. Invasive hemodynamic monitoring perioperatively: at the discretion of anesthesiologist. 5. Deep vein thrombosis prophylaxis postoperatively:regimen to be chosen by surgical team. 6. Surveillance for postoperative MI with ECG immediately postoperatively and on postoperative days 1 and 2 AND troponin levels 24 hours postoperatively and on day 4 or hospital discharge (whichever comes first): at the discretion of anesthesiologist. 7. Other measures: none  Patient had a URI on 02/13/24. She will be 30 days from diagnosis at time of surgery.Pt denies respiratory symptoms at PAT visit.   VS: BP 119/79   Pulse 66   Temp 36.9 C  (Oral)   Resp 16   Ht 5' 4.5 (1.638 m)   Wt 71.2 kg   SpO2 99%   BMI 26.53 kg/m   PROVIDERS: Mahlon Comer BRAVO, MD   LABS: Labs reviewed: Acceptable for surgery. (all labs ordered are listed, but only abnormal results are displayed)  Labs Reviewed  BASIC METABOLIC PANEL WITH GFR - Abnormal; Notable for the following components:      Result Value   Glucose, Bld 66 (*)    All other components within normal limits  CBC - Abnormal; Notable for the following components:   Hemoglobin 11.7 (*)    All other components within normal limits  SURGICAL PCR SCREEN  HEMOGLOBIN A1C      EKG 03/10/24  NSR   Stress test 06/09/2018:  IMPRESSION: 1. No reversible ischemia or infarction.   2. Normal left ventricular wall motion.   3. Left ventricular ejection fraction 66%   4. Non invasive risk stratification*: Low  Echo 07/11/2016:  Study Conclusions  - Left ventricle: The cavity size was normal. Wall thickness was   normal. Systolic function was normal. The estimated ejection   fraction was in the range of 55% to 60%. Wall motion was normal;   there were no regional wall motion abnormalities. Doppler   parameters are consistent with abnormal left ventricular   relaxation (grade 1 diastolic dysfunction). - Impressions: LS 10.4 cm/s GLS -22.3%  Impressions:  - LS 10.4 cm/s GLS -22.3% Past Medical History:  Diagnosis Date   Angina at rest    occurs whenever it wants to, but worse during agitation   Anxiety    Asthma  Binge eating disorder    Breast cancer of lower-outer quadrant of left female breast (HCC) 07/08/2015   NEVER HAD LEFT BREAST CANCER (08/15/2015)   Cancer of right breast (HCC) 08/15/2015   Chronic bronchitis (HCC)    Chronic lower back pain    Complication of anesthesia    had bronc spasms during intubation surgery 2009 on foot-need albuterol  inhaler or neb tx preop   Costochondritis    Depression    Diverticular disease    11-2020 bleed    Fibromyalgia    Hot flashes    Hyperlipidemia    Hypertension    Hypothyroidism    Osteoarthritis    all over   Peptic ulcer disease    Personal history of chemotherapy 2017   Type II diabetes mellitus (HCC)    diet controlled (08/15/2015)    Past Surgical History:  Procedure Laterality Date   ABDOMINAL HYSTERECTOMY     left my ovaries   ANKLE ARTHROSCOPY  01/02/2012   Procedure: ANKLE ARTHROSCOPY;  Surgeon: Deward DELENA Schwartz, MD;  Location: [facility];  Service: Orthopedics;  Laterality: Right;  right ankle arthroscopy with extensive debridement , dridement and drilling talar dome osteochondral lesion   ANKLE FRACTURE SURGERY Right 2009   Dr. Burnetta   BREAST BIOPSY  07/2015   CARDIAC CATHETERIZATION     CARPAL TUNNEL RELEASE Bilateral    CATARACT EXTRACTION, BILATERAL     COLONOSCOPY     DILATION AND CURETTAGE OF UTERUS     before hysterectomy   FRACTURE SURGERY     KNEE ARTHROSCOPY Bilateral    LAPAROSCOPIC CHOLECYSTECTOMY     MASTECTOMY COMPLETE / SIMPLE W/ SENTINEL NODE BIOPSY Right 08/15/2015   MASTECTOMY W/ SENTINEL NODE BIOPSY Right 08/15/2015   Procedure: TOTAL MASTECTOMY WITH SENTINEL LYMPH NODE BIOPSY AND BLUE DYE INJECTION ;  Surgeon: Elon Pacini, MD;  Location: [facility] OR;  Service: General;  Laterality: Right;   PORT-A-CATH REMOVAL N/A 11/12/2016   Procedure: REMOVAL PORT-A-CATH;  Surgeon: Lily Boas, MD;  Location: [facility];  Service: General;  Laterality: N/A;   PORTACATH PLACEMENT Left 08/15/2015   PORTACATH PLACEMENT Left 08/15/2015   Procedure: INSERTION PORT-A-CATH ;  Surgeon: Elon Pacini, MD;  Location: [facility] OR;  Service: General;  Laterality: Left;   SHOULDER ARTHROSCOPY Bilateral 2009-2011   bone spurs   TONSILLECTOMY      MEDICATIONS:  amLODipine  (NORVASC ) 5 MG tablet   atorvastatin  (LIPITOR) 20 MG tablet   benzonatate  (TESSALON ) 100 MG capsule   buPROPion  (WELLBUTRIN  XL) 300 MG 24 hr tablet   citalopram  (CELEXA )  20 MG tablet   diclofenac (VOLTAREN) 75 MG EC tablet   gabapentin  (NEURONTIN ) 300 MG capsule   levothyroxine  (SYNTHROID ) 75 MCG tablet   losartan (COZAAR) 100 MG tablet   omeprazole  (PRILOSEC) 20 MG capsule   polyethylene glycol powder (MIRALAX ) 17 GM/SCOOP powder   Propylene Glycol, PF, (SYSTANE COMPLETE PF) 0.6 % SOLN   tizanidine  (ZANAFLEX ) 2 MG capsule   traMADol  (ULTRAM ) 50 MG tablet   No current facility-administered medications for this encounter.   Burnard CHRISTELLA Odis DEVONNA [facility] Surgical Short Stay/Anesthesiology [facility] Phone [phone] 03/11/2024 2:28 PM

## 2024-03-11 NOTE — Anesthesia Preprocedure Evaluation (Addendum)
 Anesthesia Evaluation  Patient identified by MRN, date of birth, ID band Patient awake    Reviewed: Allergy & Precautions, NPO status , Patient's Chart, lab work & pertinent test results  Airway Mallampati: II  TM Distance: >3 FB Neck ROM: Full    Dental  (+) Poor Dentition,    Pulmonary asthma    breath sounds clear to auscultation       Cardiovascular hypertension, Pt. on medications + angina   Rhythm:Regular Rate:Normal     Neuro/Psych  PSYCHIATRIC DISORDERS Anxiety Depression     Neuromuscular disease    GI/Hepatic Neg liver ROS, PUD,GERD  Medicated,,  Endo/Other  diabetesHypothyroidism    Renal/GU negative Renal ROS     Musculoskeletal  (+) Arthritis ,  Fibromyalgia -  Abdominal   Peds  Hematology  (+) Blood dyscrasia, anemia   Anesthesia Other Findings   Reproductive/Obstetrics                              Anesthesia Physical Anesthesia Plan  ASA: 3  Anesthesia Plan: Spinal   Post-op Pain Management: Regional block* and Ofirmev  IV (intra-op)*   Induction: Intravenous  PONV Risk Score and Plan: 3 and Ondansetron , Dexamethasone  and Propofol  infusion  Airway Management Planned: Natural Airway and Simple Face Mask  Additional Equipment: None  Intra-op Plan:   Post-operative Plan:   Informed Consent: I have reviewed the patients History and Physical, chart, labs and discussed the procedure including the risks, benefits and alternatives for the proposed anesthesia with the patient or authorized representative who has indicated his/her understanding and acceptance.       Plan Discussed with: CRNA  Anesthesia Plan Comments: (See PAT note from 12/2  Lab Results      Component                Value               Date                      WBC                      8.0                 03/10/2024                HGB                      11.7 (L)            03/10/2024                 HCT                      37.5                03/10/2024                MCV                      93.5                03/10/2024                PLT                      399  03/10/2024           )         Anesthesia Quick Evaluation

## 2024-03-13 LAB — HEMOGLOBIN A1C
Hgb A1c MFr Bld: 5.9 % — ABNORMAL HIGH (ref 4.8–5.6)
Mean Plasma Glucose: 123 mg/dL

## 2024-03-16 NOTE — H&P (Signed)
 TOTAL KNEE ADMISSION H&P  Patient is being admitted for right total knee arthroplasty.  Therapy Plans: outpatient therapy at [facility] Disposition: Home with daughter Suzen Planned DVT Prophylaxis: aspirin  81mg  BID DME needed: walker PCP: Dr. Mahlon - clearance received Cardio: Dr. Levern - (should send today) TXA: IV Allergies: dilaudid  - anaphylaxis, codeine  - nausea, latex - rash, NSAIDs - hx of GI bleed, lyrica  - mood swings Anesthesia Concerns: none BMI: 28.2 Last HgbA1c: Not diabetic   Other: - staying overnight - NO NSAIDS HISTORY OF GI BLEED - NO DILAUDID  (morphine  is okay) - tramadol , tizanidine , tylenol    Subjective:  Chief Complaint:right knee pain.  HPI: Tracey Evans, 78 y.o. female, has a history of pain and functional disability in the right knee due to arthritis and has failed non-surgical conservative treatments for greater than 12 weeks to includeNSAID's and/or analgesics, corticosteriod injections, and activity modification.  Onset of symptoms was gradual, starting 2 years ago with gradually worsening course since that time. The patient noted no past surgery on the right knee(s).  Patient currently rates pain in the right knee(s) at 8 out of 10 with activity. Patient has worsening of pain with activity and weight bearing and pain that interferes with activities of daily living.  Patient has evidence of joint space narrowing by imaging studies. There is no active infection.  Patient Active Problem List   Diagnosis Date Noted   URI (upper respiratory infection) 02/13/2024   Pain in left knee 10/15/2023   Trauma in childhood 12/27/2021   Pruritus 05/23/2021   Other allergic rhinitis 05/23/2021   History of urticaria 05/23/2021   Other adverse food reactions, not elsewhere classified, subsequent encounter 05/23/2021   History of GI diverticular bleed 12/28/2020   Hypomagnesemia 11/02/2020   Lumbar facet arthropathy 04/13/2020   Myalgia 04/13/2020   Personal  history of breast cancer 05/07/2019   Recurrent falls 10/14/2018   Cervical disc disease 05/02/2017   Anemia associated with nutritional deficiency 01/13/2016   Vasovagal episode 10/05/2015   Genetic testing 08/21/2015   Family history of breast cancer in female 07/15/2015   Family history of colon cancer 07/15/2015   Breast cancer of lower-outer quadrant of right female breast (HCC) 07/08/2015   Disturbance in sleep behavior 11/18/2014   Fibromyalgia 10/04/2014   Dry mouth 04/29/2013   Dry eye 04/29/2013   Edema 09/04/2012   Depression 09/04/2012   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   OAB (overactive bladder) 04/01/2012   Polyarthralgia 09/18/2010   Diverticulosis of colon with hemorrhage 06/02/2010   STRESS INCONTINENCE 04/25/2010   Other asthma 04/11/2010   Hypothyroidism 01/19/2010   Hyperlipidemia associated with type 2 diabetes mellitus (HCC) 01/19/2010   Essential hypertension 01/19/2010   GERD 01/19/2010   Peptic ulcer 01/19/2010   OSTEOARTHRITIS 01/19/2010   LOW BACK PAIN 01/19/2010   Past Medical History:  Diagnosis Date   Angina at rest    occurs whenever it wants to, but worse during agitation   Anxiety    Asthma    Binge eating disorder    Breast cancer of lower-outer quadrant of left female breast (HCC) 07/08/2015   NEVER HAD LEFT BREAST CANCER (08/15/2015)   Cancer of right breast (HCC) 08/15/2015   Chronic bronchitis (HCC)    Chronic lower back pain    Complication of anesthesia    had bronc spasms during intubation surgery 2009 on foot-need albuterol  inhaler or neb tx preop   Costochondritis    Depression    Diverticular disease  11-2020 bleed   Fibromyalgia    Hot flashes    Hyperlipidemia    Hypertension    Hypothyroidism    Osteoarthritis    all over   Peptic ulcer disease    Personal history of chemotherapy 2017   Type II diabetes mellitus (HCC)    diet controlled (08/15/2015)    Past Surgical History:  Procedure  Laterality Date   ABDOMINAL HYSTERECTOMY     left my ovaries   ANKLE ARTHROSCOPY  01/02/2012   Procedure: ANKLE ARTHROSCOPY;  Surgeon: Deward DELENA Schwartz, MD;  Location: [facility];  Service: Orthopedics;  Laterality: Right;  right ankle arthroscopy with extensive debridement , dridement and drilling talar dome osteochondral lesion   ANKLE FRACTURE SURGERY Right 2009   Dr. Burnetta   BREAST BIOPSY  07/2015   CARDIAC CATHETERIZATION     CARPAL TUNNEL RELEASE Bilateral    CATARACT EXTRACTION, BILATERAL     COLONOSCOPY     DILATION AND CURETTAGE OF UTERUS     before hysterectomy   FRACTURE SURGERY     KNEE ARTHROSCOPY Bilateral    LAPAROSCOPIC CHOLECYSTECTOMY     MASTECTOMY COMPLETE / SIMPLE W/ SENTINEL NODE BIOPSY Right 08/15/2015   MASTECTOMY W/ SENTINEL NODE BIOPSY Right 08/15/2015   Procedure: TOTAL MASTECTOMY WITH SENTINEL LYMPH NODE BIOPSY AND BLUE DYE INJECTION ;  Surgeon: Elon Pacini, MD;  Location: [facility];  Service: General;  Laterality: Right;   PORT-A-CATH REMOVAL N/A 11/12/2016   Procedure: REMOVAL PORT-A-CATH;  Surgeon: Lily Boas, MD;  Location: [facility];  Service: General;  Laterality: N/A;   PORTACATH PLACEMENT Left 08/15/2015   PORTACATH PLACEMENT Left 08/15/2015   Procedure: INSERTION PORT-A-CATH ;  Surgeon: Elon Pacini, MD;  Location: [facility];  Service: General;  Laterality: Left;   SHOULDER ARTHROSCOPY Bilateral 2009-2011   bone spurs   TONSILLECTOMY      No current facility-administered medications for this encounter.   Current Outpatient Medications  Medication Sig Dispense Refill Last Dose/Taking   amLODipine  (NORVASC ) 5 MG tablet Take 5 mg by mouth daily.   Taking   atorvastatin  (LIPITOR) 20 MG tablet TAKE 1 TABLET BY MOUTH EVERY DAY 90 tablet 1 Taking   buPROPion  (WELLBUTRIN  XL) 300 MG 24 hr tablet TAKE 1 TABLET BY MOUTH EVERY DAY 90 tablet 1 Taking   citalopram  (CELEXA ) 20 MG tablet Take 1 tablet (20 mg total) by mouth daily. 30  tablet 3 Taking   gabapentin  (NEURONTIN ) 300 MG capsule TAKE 1 CAPSULE IN THE MORNING , 1 IN AFTERNOON AND 2 CAPSULES BEFORE BED 120 capsule 3 Taking   losartan  (COZAAR ) 100 MG tablet Take 100 mg by mouth daily.   Taking   polyethylene glycol powder (MIRALAX ) 17 GM/SCOOP powder Take 17 g by mouth 2 (two) times daily as needed for mild constipation. 255 g 1 Taking As Needed   Propylene Glycol, PF, (SYSTANE COMPLETE PF) 0.6 % SOLN Place 1 drop into the right eye daily as needed (Eye irritation).   Taking As Needed   tizanidine  (ZANAFLEX ) 2 MG capsule Take 2 mg by mouth 2 (two) times daily as needed for muscle spasms.   Taking As Needed   benzonatate  (TESSALON ) 100 MG capsule Take 1 capsule (100 mg total) by mouth every 8 (eight) hours. (Patient not taking: Reported on 03/02/2024) 21 capsule 0 Not Taking   diclofenac (VOLTAREN) 75 MG EC tablet Take 75 mg by mouth 2 (two) times daily.      levothyroxine  (SYNTHROID )  75 MCG tablet TAKE 1 TABLET BY MOUTH EVERY DAY BEFORE BREAKFAST 90 tablet 1    omeprazole  (PRILOSEC) 20 MG capsule TAKE 1 CAPSULE BY MOUTH EVERY DAY 90 capsule 1    traMADol  (ULTRAM ) 50 MG tablet Take 1 tablet (50 mg total) by mouth every 6 (six) hours as needed. 15 tablet 0    Allergies  Allergen Reactions   Contrast Media [Iodinated Contrast Media] Shortness Of Breath    Patient has SOB, tightness in chest and feels like an elephant is sitting on chest, patient should be  scanned at hospital Per Dr. Wadie   Dilaudid  [Hydromorphone  Hcl] Anaphylaxis    Pt stopped breathing   Adhesive [Tape]     blister   Codeine  Nausea Only    insomnia   Lactose Intolerance (Gi) Diarrhea   Latex Other (See Comments)    REACTION: burns skin- Latex tape only    Nsaids    Lyrica  [Pregabalin ] Other (See Comments)    Mood swings.     Social History   Tobacco Use   Smoking status: Never   Smokeless tobacco: Never  Substance Use Topics   Alcohol  use: Not Currently    Alcohol /week: 0.0 standard  drinks of alcohol     Comment: 08/15/2015 1/2 glass of wine q 2 months     Family History  Problem Relation Age of Onset   Colon polyps Mother    Dementia Mother    Stroke Mother    Heart Problems Mother    Dementia Father    Alcohol  abuse Father    Colon cancer Father        dx. 74 or younger-pt states never confirmed   Breast cancer Sister 10       inflammatory   Diverticulitis Sister        maternal half-sister; severe - causing partial colectomy   Breast cancer Sister        maternal half-sister; dx. early 43s   Alcohol  abuse Brother    Colon polyps Brother    Breast cancer Other 31       niece   Breast cancer Other        niece dx. 50s   Esophageal cancer Neg Hx    Stomach cancer Neg Hx    Rectal cancer Neg Hx      Review of Systems  Constitutional:  Negative for chills and fever.  Respiratory:  Negative for cough and shortness of breath.   Cardiovascular:  Negative for chest pain.  Gastrointestinal:  Negative for nausea and vomiting.  Musculoskeletal:  Positive for arthralgias.     Objective:  Physical Exam Bilateral knee exams: No palpable effusions, warmth erythema Bilateral genu valgum, passively correctable with discomfort She has 5 degree flexion contracture on the right with flexion to 100 degrees Left knee flexion is to about 110 degrees with less discomfort Most of her tenderness is involving the right knee globally laterally more so than other.   Vital signs in last 24 hours:    Labs:   Estimated body mass index is 26.53 kg/m as calculated from the following:   Height as of 03/10/24: 5' 4.5 (1.638 m).   Weight as of 03/10/24: 71.2 kg.   Imaging Review Plain radiographs demonstrate severe degenerative joint disease of the right knee(s). The overall alignment isneutral. The bone quality appears to be adequate for age and reported activity level.      Assessment/Plan:  End stage arthritis, right knee   The patient history, physical  examination, clinical judgment of the provider and imaging studies are consistent with end stage degenerative joint disease of the right knee(s) and total knee arthroplasty is deemed medically necessary. The treatment options including medical management, injection therapy arthroscopy and arthroplasty were discussed at length. The risks and benefits of total knee arthroplasty were presented and reviewed. The risks due to aseptic loosening, infection, stiffness, patella tracking problems, thromboembolic complications and other imponderables were discussed. The patient acknowledged the explanation, agreed to proceed with the plan and consent was signed. Patient is being admitted for inpatient treatment for surgery, pain control, PT, OT, prophylactic antibiotics, VTE prophylaxis, progressive ambulation and ADL's and discharge planning. The patient is planning to be discharged home.     Patient's anticipated LOS is less than 2 midnights, meeting these requirements: - Younger than 80 - Lives within 1 hour of care - Has a competent adult at home to recover with post-op recover - NO history of  - Chronic pain requiring opiods  - Diabetes  - Coronary Artery Disease  - Heart failure  - Heart attack  - Stroke  - DVT/VTE  - Cardiac arrhythmia  - Respiratory Failure/COPD  - Renal failure  - Anemia  - Advanced Liver disease  Rosina Calin, PA-C Orthopedic Surgery [facility] [place] [phone]

## 2024-03-17 ENCOUNTER — Inpatient Hospital Stay (HOSPITAL_COMMUNITY): Payer: Self-pay | Admitting: Medical

## 2024-03-17 ENCOUNTER — Ambulatory Visit (HOSPITAL_COMMUNITY): Admitting: Certified Registered"

## 2024-03-17 ENCOUNTER — Encounter (HOSPITAL_COMMUNITY): Payer: Self-pay | Admitting: Orthopedic Surgery

## 2024-03-17 ENCOUNTER — Other Ambulatory Visit: Payer: Self-pay

## 2024-03-17 ENCOUNTER — Observation Stay (HOSPITAL_COMMUNITY)
Admission: RE | Admit: 2024-03-17 | Discharge: 2024-03-20 | DRG: 470 | Disposition: A | Attending: Orthopedic Surgery | Admitting: Orthopedic Surgery

## 2024-03-17 ENCOUNTER — Encounter (HOSPITAL_COMMUNITY): Admission: RE | Disposition: A | Payer: Self-pay | Source: Home / Self Care | Attending: Orthopedic Surgery

## 2024-03-17 DIAGNOSIS — M797 Fibromyalgia: Secondary | ICD-10-CM | POA: Diagnosis present

## 2024-03-17 DIAGNOSIS — Z79899 Other long term (current) drug therapy: Secondary | ICD-10-CM | POA: Diagnosis not present

## 2024-03-17 DIAGNOSIS — Z853 Personal history of malignant neoplasm of breast: Secondary | ICD-10-CM | POA: Diagnosis not present

## 2024-03-17 DIAGNOSIS — Z8711 Personal history of peptic ulcer disease: Secondary | ICD-10-CM | POA: Diagnosis not present

## 2024-03-17 DIAGNOSIS — F418 Other specified anxiety disorders: Secondary | ICD-10-CM | POA: Diagnosis not present

## 2024-03-17 DIAGNOSIS — G8918 Other acute postprocedural pain: Secondary | ICD-10-CM | POA: Diagnosis not present

## 2024-03-17 DIAGNOSIS — M1711 Unilateral primary osteoarthritis, right knee: Secondary | ICD-10-CM | POA: Diagnosis not present

## 2024-03-17 DIAGNOSIS — Z79890 Hormone replacement therapy (postmenopausal): Secondary | ICD-10-CM | POA: Diagnosis not present

## 2024-03-17 DIAGNOSIS — E039 Hypothyroidism, unspecified: Secondary | ICD-10-CM | POA: Diagnosis present

## 2024-03-17 DIAGNOSIS — Z800 Family history of malignant neoplasm of digestive organs: Secondary | ICD-10-CM | POA: Diagnosis not present

## 2024-03-17 DIAGNOSIS — I110 Hypertensive heart disease with heart failure: Secondary | ICD-10-CM | POA: Diagnosis present

## 2024-03-17 DIAGNOSIS — D62 Acute posthemorrhagic anemia: Secondary | ICD-10-CM | POA: Diagnosis not present

## 2024-03-17 DIAGNOSIS — Z9842 Cataract extraction status, left eye: Secondary | ICD-10-CM | POA: Diagnosis not present

## 2024-03-17 DIAGNOSIS — Z803 Family history of malignant neoplasm of breast: Secondary | ICD-10-CM | POA: Diagnosis not present

## 2024-03-17 DIAGNOSIS — E739 Lactose intolerance, unspecified: Secondary | ICD-10-CM | POA: Diagnosis present

## 2024-03-17 DIAGNOSIS — Z888 Allergy status to other drugs, medicaments and biological substances status: Secondary | ICD-10-CM | POA: Diagnosis not present

## 2024-03-17 DIAGNOSIS — E119 Type 2 diabetes mellitus without complications: Secondary | ICD-10-CM | POA: Diagnosis present

## 2024-03-17 DIAGNOSIS — Z96651 Presence of right artificial knee joint: Principal | ICD-10-CM

## 2024-03-17 DIAGNOSIS — I10 Essential (primary) hypertension: Secondary | ICD-10-CM | POA: Diagnosis not present

## 2024-03-17 DIAGNOSIS — Z823 Family history of stroke: Secondary | ICD-10-CM | POA: Diagnosis not present

## 2024-03-17 DIAGNOSIS — I209 Angina pectoris, unspecified: Secondary | ICD-10-CM | POA: Diagnosis not present

## 2024-03-17 DIAGNOSIS — Z91041 Radiographic dye allergy status: Secondary | ICD-10-CM | POA: Diagnosis not present

## 2024-03-17 DIAGNOSIS — Z83719 Family history of colon polyps, unspecified: Secondary | ICD-10-CM | POA: Diagnosis not present

## 2024-03-17 DIAGNOSIS — Z9841 Cataract extraction status, right eye: Secondary | ICD-10-CM | POA: Diagnosis not present

## 2024-03-17 DIAGNOSIS — Z01818 Encounter for other preprocedural examination: Secondary | ICD-10-CM

## 2024-03-17 DIAGNOSIS — Z9011 Acquired absence of right breast and nipple: Secondary | ICD-10-CM | POA: Diagnosis not present

## 2024-03-17 DIAGNOSIS — Z885 Allergy status to narcotic agent status: Secondary | ICD-10-CM | POA: Diagnosis not present

## 2024-03-17 DIAGNOSIS — Z90710 Acquired absence of both cervix and uterus: Secondary | ICD-10-CM | POA: Diagnosis not present

## 2024-03-17 DIAGNOSIS — Z9221 Personal history of antineoplastic chemotherapy: Secondary | ICD-10-CM | POA: Diagnosis not present

## 2024-03-17 DIAGNOSIS — I2510 Atherosclerotic heart disease of native coronary artery without angina pectoris: Secondary | ICD-10-CM | POA: Diagnosis present

## 2024-03-17 HISTORY — DX: Prediabetes: R73.03

## 2024-03-17 HISTORY — PX: TOTAL KNEE ARTHROPLASTY: SHX125

## 2024-03-17 LAB — GLUCOSE, CAPILLARY: Glucose-Capillary: 90 mg/dL (ref 70–99)

## 2024-03-17 SURGERY — ARTHROPLASTY, KNEE, TOTAL
Anesthesia: Spinal | Site: Knee | Laterality: Right

## 2024-03-17 MED ORDER — PROPOFOL 10 MG/ML IV BOLUS
INTRAVENOUS | Status: AC
  Filled 2024-03-17: qty 20

## 2024-03-17 MED ORDER — PROPOFOL 1000 MG/100ML IV EMUL
INTRAVENOUS | Status: AC
  Filled 2024-03-17: qty 100

## 2024-03-17 MED ORDER — ONDANSETRON HCL 4 MG/2ML IJ SOLN
4.0000 mg | Freq: Four times a day (QID) | INTRAMUSCULAR | Status: DC | PRN
  Administered 2024-03-18: 4 mg via INTRAVENOUS
  Filled 2024-03-17: qty 2

## 2024-03-17 MED ORDER — 0.9 % SODIUM CHLORIDE (POUR BTL) OPTIME
TOPICAL | Status: DC | PRN
  Administered 2024-03-17: 1000 mL

## 2024-03-17 MED ORDER — BUPIVACAINE-EPINEPHRINE (PF) 0.25% -1:200000 IJ SOLN
INTRAMUSCULAR | Status: AC
  Filled 2024-03-17: qty 30

## 2024-03-17 MED ORDER — PROPOFOL 10 MG/ML IV BOLUS
INTRAVENOUS | Status: DC | PRN
  Administered 2024-03-17: 10 mg via INTRAVENOUS
  Administered 2024-03-17: 40 mg via INTRAVENOUS
  Administered 2024-03-17: 20 mg via INTRAVENOUS

## 2024-03-17 MED ORDER — OXYCODONE HCL 5 MG/5ML PO SOLN: 5.0000 mg | Freq: Once | ORAL | Status: DC | PRN

## 2024-03-17 MED ORDER — ACETAMINOPHEN 500 MG PO TABS
1000.0000 mg | ORAL_TABLET | Freq: Four times a day (QID) | ORAL | Status: DC
  Administered 2024-03-17 – 2024-03-20 (×11): 1000 mg via ORAL
  Filled 2024-03-17 (×12): qty 2

## 2024-03-17 MED ORDER — ROPIVACAINE HCL 5 MG/ML IJ SOLN
INTRAMUSCULAR | Status: DC | PRN
  Administered 2024-03-17: 20 mL via PERINEURAL

## 2024-03-17 MED ORDER — PHENYLEPHRINE HCL (PRESSORS) 10 MG/ML IV SOLN
INTRAVENOUS | Status: DC | PRN
  Administered 2024-03-17: 80 ug via INTRAVENOUS

## 2024-03-17 MED ORDER — MIDAZOLAM HCL (PF) 2 MG/2ML IJ SOLN: 1.0000 mg | INTRAMUSCULAR | Status: DC | PRN

## 2024-03-17 MED ORDER — CEFAZOLIN SODIUM-DEXTROSE 2-4 GM/100ML-% IV SOLN
2.0000 g | INTRAVENOUS | Status: AC
  Administered 2024-03-17: 2 g via INTRAVENOUS
  Filled 2024-03-17: qty 100

## 2024-03-17 MED ORDER — TRAMADOL HCL 50 MG PO TABS
50.0000 mg | ORAL_TABLET | Freq: Four times a day (QID) | ORAL | Status: DC | PRN
  Administered 2024-03-17 (×2): 100 mg via ORAL
  Filled 2024-03-17 (×4): qty 2

## 2024-03-17 MED ORDER — DEXAMETHASONE SOD PHOSPHATE PF 10 MG/ML IJ SOLN: 10.0000 mg | Freq: Once | INTRAMUSCULAR | Status: DC

## 2024-03-17 MED ORDER — ATORVASTATIN CALCIUM 20 MG PO TABS
20.0000 mg | ORAL_TABLET | Freq: Every day | ORAL | Status: DC
  Administered 2024-03-17 – 2024-03-20 (×4): 20 mg via ORAL
  Filled 2024-03-17 (×4): qty 1

## 2024-03-17 MED ORDER — ONDANSETRON HCL 4 MG PO TABS
4.0000 mg | ORAL_TABLET | Freq: Four times a day (QID) | ORAL | Status: DC | PRN
  Administered 2024-03-19 (×2): 4 mg via ORAL
  Filled 2024-03-17 (×2): qty 1

## 2024-03-17 MED ORDER — HYDROMORPHONE HCL 1 MG/ML IJ SOLN: 0.2500 mg | INTRAMUSCULAR | Status: DC | PRN

## 2024-03-17 MED ORDER — KETOROLAC TROMETHAMINE 30 MG/ML IJ SOLN
INTRAMUSCULAR | Status: AC
  Filled 2024-03-17: qty 1

## 2024-03-17 MED ORDER — MORPHINE SULFATE (PF) 2 MG/ML IV SOLN
0.5000 mg | INTRAVENOUS | Status: DC | PRN
  Administered 2024-03-17 – 2024-03-18 (×2): 0.5 mg via INTRAVENOUS
  Filled 2024-03-17 (×3): qty 1

## 2024-03-17 MED ORDER — PHENYLEPHRINE HCL-NACL 20-0.9 MG/250ML-% IV SOLN
INTRAVENOUS | Status: DC | PRN
  Administered 2024-03-17: 30 ug/min via INTRAVENOUS

## 2024-03-17 MED ORDER — LACTATED RINGERS IV SOLN: INTRAVENOUS | Status: DC

## 2024-03-17 MED ORDER — BISACODYL 10 MG RE SUPP: 10.0000 mg | Freq: Every day | RECTAL | Status: DC | PRN

## 2024-03-17 MED ORDER — BUPROPION HCL ER (XL) 300 MG PO TB24
300.0000 mg | ORAL_TABLET | Freq: Every day | ORAL | Status: DC
  Administered 2024-03-18 – 2024-03-20 (×3): 300 mg via ORAL
  Filled 2024-03-17 (×3): qty 1

## 2024-03-17 MED ORDER — DEXAMETHASONE SOD PHOSPHATE PF 10 MG/ML IJ SOLN
8.0000 mg | Freq: Once | INTRAMUSCULAR | Status: AC
  Administered 2024-03-17: 8 mg via INTRAVENOUS

## 2024-03-17 MED ORDER — LIDOCAINE HCL (CARDIAC) PF 100 MG/5ML IV SOSY
PREFILLED_SYRINGE | INTRAVENOUS | Status: DC | PRN
  Administered 2024-03-17: 40 mg via INTRAVENOUS
  Administered 2024-03-17: 60 mg via INTRAVENOUS

## 2024-03-17 MED ORDER — LOSARTAN POTASSIUM 50 MG PO TABS
100.0000 mg | ORAL_TABLET | Freq: Every day | ORAL | Status: AC
  Administered 2024-03-20: 100 mg via ORAL
  Filled 2024-03-17 (×3): qty 2

## 2024-03-17 MED ORDER — MEPERIDINE HCL 25 MG/ML IJ SOLN: 6.2500 mg | INTRAMUSCULAR | Status: DC | PRN

## 2024-03-17 MED ORDER — STERILE WATER FOR IRRIGATION IR SOLN
Status: DC | PRN
  Administered 2024-03-17: 2000 mL

## 2024-03-17 MED ORDER — SODIUM CHLORIDE 0.9 % IV SOLN: INTRAVENOUS | Status: DC

## 2024-03-17 MED ORDER — METOCLOPRAMIDE HCL 5 MG/ML IJ SOLN: 5.0000 mg | Freq: Three times a day (TID) | INTRAMUSCULAR | Status: DC | PRN

## 2024-03-17 MED ORDER — DIPHENHYDRAMINE HCL 12.5 MG/5ML PO ELIX
12.5000 mg | ORAL_SOLUTION | ORAL | Status: DC | PRN
  Administered 2024-03-17: 25 mg via ORAL
  Filled 2024-03-17: qty 10

## 2024-03-17 MED ORDER — LIDOCAINE HCL (PF) 2 % IJ SOLN
INTRAMUSCULAR | Status: AC
  Filled 2024-03-17: qty 5

## 2024-03-17 MED ORDER — CEFAZOLIN SODIUM-DEXTROSE 2-4 GM/100ML-% IV SOLN
2.0000 g | Freq: Four times a day (QID) | INTRAVENOUS | Status: AC
  Administered 2024-03-17 (×2): 2 g via INTRAVENOUS
  Filled 2024-03-17 (×2): qty 100

## 2024-03-17 MED ORDER — CITALOPRAM HYDROBROMIDE 20 MG PO TABS
20.0000 mg | ORAL_TABLET | Freq: Every day | ORAL | Status: DC
  Administered 2024-03-18 – 2024-03-20 (×3): 20 mg via ORAL
  Filled 2024-03-17 (×3): qty 1

## 2024-03-17 MED ORDER — SODIUM CHLORIDE (PF) 0.9 % IJ SOLN
INTRAMUSCULAR | Status: DC | PRN
  Administered 2024-03-17: 61 mL

## 2024-03-17 MED ORDER — LEVOTHYROXINE SODIUM 75 MCG PO TABS
75.0000 ug | ORAL_TABLET | Freq: Every day | ORAL | Status: AC
  Administered 2024-03-18 – 2024-03-20 (×3): 75 ug via ORAL
  Filled 2024-03-17 (×3): qty 1

## 2024-03-17 MED ORDER — METOCLOPRAMIDE HCL 5 MG PO TABS: 5.0000 mg | ORAL_TABLET | Freq: Three times a day (TID) | ORAL | Status: DC | PRN

## 2024-03-17 MED ORDER — CHLORHEXIDINE GLUCONATE 0.12 % MT SOLN
15.0000 mL | Freq: Once | OROMUCOSAL | Status: AC
  Administered 2024-03-17: 15 mL via OROMUCOSAL

## 2024-03-17 MED ORDER — ORAL CARE MOUTH RINSE: 15.0000 mL | Freq: Once | OROMUCOSAL | Status: AC

## 2024-03-17 MED ORDER — FENTANYL CITRATE (PF) 100 MCG/2ML IJ SOLN
INTRAMUSCULAR | Status: DC | PRN
  Administered 2024-03-17 (×2): 12.5 ug via INTRAVENOUS

## 2024-03-17 MED ORDER — ALUM & MAG HYDROXIDE-SIMETH 200-200-20 MG/5ML PO SUSP: 30.0000 mL | ORAL | Status: DC | PRN

## 2024-03-17 MED ORDER — FENTANYL CITRATE (PF) 50 MCG/ML IJ SOSY
50.0000 ug | PREFILLED_SYRINGE | INTRAMUSCULAR | Status: DC | PRN
  Administered 2024-03-17: 50 ug via INTRAVENOUS
  Filled 2024-03-17: qty 2

## 2024-03-17 MED ORDER — OXYCODONE HCL 5 MG PO TABS: 5.0000 mg | ORAL_TABLET | Freq: Once | ORAL | Status: DC | PRN

## 2024-03-17 MED ORDER — FENTANYL CITRATE (PF) 100 MCG/2ML IJ SOLN
INTRAMUSCULAR | Status: AC
  Filled 2024-03-17: qty 2

## 2024-03-17 MED ORDER — POVIDONE-IODINE 10 % EX SWAB
2.0000 | Freq: Once | CUTANEOUS | Status: AC
  Administered 2024-03-17: 2 via TOPICAL

## 2024-03-17 MED ORDER — ONDANSETRON HCL 4 MG/2ML IJ SOLN
INTRAMUSCULAR | Status: AC
  Filled 2024-03-17: qty 2

## 2024-03-17 MED ORDER — TRANEXAMIC ACID-NACL 1000-0.7 MG/100ML-% IV SOLN
1000.0000 mg | Freq: Once | INTRAVENOUS | Status: AC
  Administered 2024-03-17: 1000 mg via INTRAVENOUS
  Filled 2024-03-17: qty 100

## 2024-03-17 MED ORDER — ORAL CARE MOUTH RINSE: 15.0000 mL | OROMUCOSAL | Status: AC | PRN

## 2024-03-17 MED ORDER — ONDANSETRON HCL 4 MG/2ML IJ SOLN
INTRAMUSCULAR | Status: DC | PRN
  Administered 2024-03-17: 4 mg via INTRAVENOUS

## 2024-03-17 MED ORDER — SODIUM CHLORIDE 0.9 % IR SOLN
Status: DC | PRN
  Administered 2024-03-17: 1000 mL

## 2024-03-17 MED ORDER — TIZANIDINE HCL 2 MG PO TABS
2.0000 mg | ORAL_TABLET | Freq: Three times a day (TID) | ORAL | Status: DC
  Administered 2024-03-17 – 2024-03-18 (×5): 2 mg via ORAL
  Filled 2024-03-17 (×6): qty 1

## 2024-03-17 MED ORDER — BUPIVACAINE IN DEXTROSE 0.75-8.25 % IT SOLN
INTRATHECAL | Status: DC | PRN
  Administered 2024-03-17: 1.6 mL via INTRATHECAL

## 2024-03-17 MED ORDER — TRANEXAMIC ACID-NACL 1000-0.7 MG/100ML-% IV SOLN
1000.0000 mg | INTRAVENOUS | Status: AC
  Administered 2024-03-17: 1000 mg via INTRAVENOUS
  Filled 2024-03-17: qty 100

## 2024-03-17 MED ORDER — DROPERIDOL 2.5 MG/ML IJ SOLN: 0.6250 mg | Freq: Once | INTRAMUSCULAR | Status: DC | PRN

## 2024-03-17 MED ORDER — PHENOL 1.4 % MT LIQD: 1.0000 | OROMUCOSAL | Status: DC | PRN

## 2024-03-17 MED ORDER — ASPIRIN 81 MG PO CHEW
81.0000 mg | CHEWABLE_TABLET | Freq: Two times a day (BID) | ORAL | Status: DC
  Administered 2024-03-17 – 2024-03-20 (×6): 81 mg via ORAL
  Filled 2024-03-17 (×6): qty 1

## 2024-03-17 MED ORDER — ACETAMINOPHEN 10 MG/ML IV SOLN
1000.0000 mg | Freq: Once | INTRAVENOUS | Status: DC | PRN
  Administered 2024-03-17: 1000 mg via INTRAVENOUS

## 2024-03-17 MED ORDER — PANTOPRAZOLE SODIUM 40 MG PO TBEC
40.0000 mg | DELAYED_RELEASE_TABLET | Freq: Every day | ORAL | Status: DC
  Administered 2024-03-18 – 2024-03-20 (×3): 40 mg via ORAL
  Filled 2024-03-17 (×3): qty 1

## 2024-03-17 MED ORDER — POLYETHYLENE GLYCOL 3350 17 G PO PACK
17.0000 g | PACK | Freq: Two times a day (BID) | ORAL | Status: DC
  Administered 2024-03-17 – 2024-03-18 (×2): 17 g via ORAL
  Filled 2024-03-17 (×4): qty 1

## 2024-03-17 MED ORDER — MENTHOL 3 MG MT LOZG: 1.0000 | LOZENGE | OROMUCOSAL | Status: DC | PRN

## 2024-03-17 MED ORDER — SODIUM CHLORIDE (PF) 0.9 % IJ SOLN
INTRAMUSCULAR | Status: AC
  Filled 2024-03-17: qty 30

## 2024-03-17 MED ORDER — ACETAMINOPHEN 10 MG/ML IV SOLN
INTRAVENOUS | Status: AC
  Filled 2024-03-17: qty 100

## 2024-03-17 MED ORDER — AMLODIPINE BESYLATE 5 MG PO TABS
5.0000 mg | ORAL_TABLET | Freq: Every day | ORAL | Status: DC
  Administered 2024-03-20: 5 mg via ORAL
  Filled 2024-03-17 (×3): qty 1

## 2024-03-17 MED ORDER — SENNA 8.6 MG PO TABS
2.0000 | ORAL_TABLET | Freq: Every day | ORAL | Status: DC
  Administered 2024-03-17: 17.2 mg via ORAL
  Filled 2024-03-17: qty 2

## 2024-03-17 MED ORDER — GABAPENTIN 300 MG PO CAPS
300.0000 mg | ORAL_CAPSULE | Freq: Two times a day (BID) | ORAL | Status: DC
  Administered 2024-03-17 – 2024-03-20 (×6): 300 mg via ORAL
  Filled 2024-03-17 (×6): qty 1

## 2024-03-17 MED ORDER — PROPOFOL 500 MG/50ML IV EMUL
INTRAVENOUS | Status: DC | PRN
  Administered 2024-03-17: 75 ug/kg/min via INTRAVENOUS

## 2024-03-17 SURGICAL SUPPLY — 46 items
ATTUNE MED ANAT PAT 35 KNEE (Knees) IMPLANT
BAG COUNTER SPONGE SURGICOUNT (BAG) IMPLANT
BAG ZIPLOCK 12X15 (MISCELLANEOUS) ×1 IMPLANT
BASEPLATE TIB CMT FB PCKT SZ4 (Stem) IMPLANT
BLADE SAW SGTL 11.0X1.19X90.0M (BLADE) IMPLANT
BLADE SAW SGTL 13.0X1.19X90.0M (BLADE) ×1 IMPLANT
BNDG ELASTIC 6INX 5YD STR LF (GAUZE/BANDAGES/DRESSINGS) ×1 IMPLANT
BNDG ELASTIC 6X10 VLCR STRL LF (GAUZE/BANDAGES/DRESSINGS) IMPLANT
BOWL SMART MIX CTS (DISPOSABLE) ×1 IMPLANT
CEMENT HV SMART SET (Cement) ×2 IMPLANT
COMPONENT FEM CMT ATTN NRW 4RT (Joint) IMPLANT
COVER SURGICAL LIGHT HANDLE (MISCELLANEOUS) ×1 IMPLANT
CUFF TRNQT CYL 34X4.125X (TOURNIQUET CUFF) ×1 IMPLANT
DERMABOND ADVANCED .7 DNX12 (GAUZE/BANDAGES/DRESSINGS) ×1 IMPLANT
DRAPE U-SHAPE 47X51 STRL (DRAPES) ×1 IMPLANT
DRESSING AQUACEL AG SP 3.5X10 (GAUZE/BANDAGES/DRESSINGS) ×1 IMPLANT
DURAPREP 26ML APPLICATOR (WOUND CARE) ×2 IMPLANT
ELECT REM PT RETURN 15FT ADLT (MISCELLANEOUS) ×1 IMPLANT
GLOVE BIO SURGEON STRL SZ 6 (GLOVE) ×1 IMPLANT
GLOVE BIOGEL PI IND STRL 6.5 (GLOVE) ×1 IMPLANT
GLOVE BIOGEL PI IND STRL 7.5 (GLOVE) ×1 IMPLANT
GLOVE ORTHO TXT STRL SZ7.5 (GLOVE) ×2 IMPLANT
GOWN STRL REUS W/ TWL LRG LVL3 (GOWN DISPOSABLE) ×2 IMPLANT
HOLDER FOLEY CATH W/STRAP (MISCELLANEOUS) IMPLANT
INSERT TIB ATTUNE KNEE 4 8 RT (Insert) IMPLANT
KIT TURNOVER KIT A (KITS) ×1 IMPLANT
MANIFOLD NEPTUNE II (INSTRUMENTS) ×1 IMPLANT
NDL SAFETY ECLIPSE 18X1.5 (NEEDLE) IMPLANT
NS IRRIG 1000ML POUR BTL (IV SOLUTION) ×1 IMPLANT
PACK TOTAL KNEE CUSTOM (KITS) ×1 IMPLANT
PENCIL SMOKE EVACUATOR (MISCELLANEOUS) ×1 IMPLANT
PIN FIX SIGMA LCS THRD HI (PIN) IMPLANT
PROTECTOR NERVE ULNAR (MISCELLANEOUS) ×1 IMPLANT
SET HNDPC FAN SPRY TIP SCT (DISPOSABLE) ×1 IMPLANT
SET PAD KNEE POSITIONER (MISCELLANEOUS) ×1 IMPLANT
SPIKE FLUID TRANSFER (MISCELLANEOUS) ×2 IMPLANT
SUT MNCRL AB 4-0 PS2 18 (SUTURE) ×1 IMPLANT
SUT STRATAFIX PDS+ 0 24IN (SUTURE) ×1 IMPLANT
SUT VIC AB 1 CT1 36 (SUTURE) ×1 IMPLANT
SUT VIC AB 2-0 CT1 TAPERPNT 27 (SUTURE) ×2 IMPLANT
SYR 3ML LL SCALE MARK (SYRINGE) ×1 IMPLANT
TOWEL GREEN STERILE FF (TOWEL DISPOSABLE) ×1 IMPLANT
TRAY FOLEY MTR SLVR 16FR STAT (SET/KITS/TRAYS/PACK) ×1 IMPLANT
TUBE SUCTION HIGH CAP CLEAR NV (SUCTIONS) ×1 IMPLANT
WATER STERILE IRR 1000ML POUR (IV SOLUTION) ×2 IMPLANT
WRAP KNEE MAXI GEL POST OP (GAUZE/BANDAGES/DRESSINGS) ×1 IMPLANT

## 2024-03-17 NOTE — Evaluation (Signed)
 Physical Therapy Evaluation Patient Details Name: Tracey Evans MRN: [id number] DOB: Feb 25, 1946 Today's Date: 03/17/2024  History of Present Illness  Pt is 78 yo female s/p R TKA on 129/25.  Pt with hx including but not limited to breast CA, cervical ddd, fibromyalgia, depression, chemo therapy induced neuropathy, OA, back pain/sciatica, HTN, DM2  Clinical Impression  Pt is s/p TKA resulting in the deficits listed below (see PT Problem List). At baseline, pt is independent.  She has been using cane or rollator lately due to pain.  Today, pt agreeable to PT.  She was rocking in pain at arrival but felt to be more from her sciatica - improved with position change and pt able to ambulate 40'.  After 40' ambulation reports some nausea and hot flash so returned to sitting, BP was 104/73, and symptoms improved after 2-3 mins sitting.  Notified RN.  Pt expected to progress well.  Pt will benefit from acute skilled PT to increase their independence and safety with mobility to allow discharge.          If plan is discharge home, recommend the following: A little help with walking and/or transfers;A little help with bathing/dressing/bathroom;Assistance with cooking/housework;Help with stairs or ramp for entrance   Can travel by private vehicle        Equipment Recommendations Rolling walker (2 wheels)  Recommendations for Other Services       Functional Status Assessment Patient has had a recent decline in their functional status and demonstrates the ability to make significant improvements in function in a reasonable and predictable amount of time.     Precautions / Restrictions Precautions Precautions: Knee;Fall Restrictions RLE Weight Bearing Per Provider Order: Weight bearing as tolerated      Mobility  Bed Mobility Overal bed mobility: Needs Assistance Bed Mobility: Supine to Sit     Supine to sit: Contact guard          Transfers Overall transfer level: Needs  assistance Equipment used: Rolling walker (2 wheels) Transfers: Sit to/from Stand Sit to Stand: Contact guard assist           General transfer comment: good hand placement; CGA for safety    Ambulation/Gait Ambulation/Gait assistance: Contact guard assist Gait Distance (Feet): 40 Feet Assistive device: Rolling walker (2 wheels) Gait Pattern/deviations: Step-to pattern, Decreased stride length, Decreased weight shift to right Gait velocity: decreased     General Gait Details: Had chair follow for safety and pt reports some nausea so had to return to sitting; otherwise , was tolerating well  Stairs            Wheelchair Mobility     Tilt Bed    Modified Rankin (Stroke Patients Only)       Balance Overall balance assessment: Needs assistance Sitting-balance support: No upper extremity supported Sitting balance-Leahy Scale: Good     Standing balance support: Bilateral upper extremity supported Standing balance-Leahy Scale: Poor Standing balance comment: steady with RW: did not test without                             Pertinent Vitals/Pain Pain Assessment Pain Assessment: 0-10 Pain Score: 9  (9/10 pre, 6/10 post) Pain Location: knee but also R buttock and down thigh (hx sciatica) Pain Descriptors / Indicators: Discomfort, Shooting, Sharp, Radiating Pain Intervention(s): Limited activity within patient's tolerance, Monitored during session, Premedicated before session, Repositioned, Ice applied, Other (comment) (Pt rocking in pain and holding  R LE at arrival.  Reports worse pain in buttock and radiating with hx of sciatica.  Pain significant decreased with repositioning.)    Home Living Family/patient expects to be discharged to:: Private residence Living Arrangements: Children Available Help at Discharge: Family;Available 24 hours/day Type of Home: Apartment Home Access: Level entry       Home Layout: One level Home Equipment: Tub bench;Grab  bars - tub/shower;Hand held shower head;Rollator (4 wheels);Cane - single point      Prior Function Prior Level of Function : Independent/Modified Independent             Mobility Comments: Could ambulate in community; used rollator in community; cane in home ADLs Comments: independent with adls and shares IADLs with dtr     Extremity/Trunk Assessment   Upper Extremity Assessment Upper Extremity Assessment: Overall WFL for tasks assessed    Lower Extremity Assessment Lower Extremity Assessment: LLE deficits/detail;RLE deficits/detail RLE Deficits / Details: Expected post op changes; ROM 5 to 60 degrees, limited by pain; MMT: ankle 5/5; hip and knee 3/5 LLE Deficits / Details: ROM WFL; MMT 5/5    Cervical / Trunk Assessment Cervical / Trunk Assessment: Normal  Communication        Cognition Arousal: Alert Behavior During Therapy: WFL for tasks assessed/performed   PT - Cognitive impairments: No apparent impairments                       PT - Cognition Comments: dtr present         Cueing       General Comments General comments (skin integrity, edema, etc.): Pt reports some nausea with walking but denied lightheaded.  Had pt sit and elevated legs.  Returned to room and also reports hot flash.  Checked and BP was 104/73 which pt reports is low for her.  Symptoms improving after 2-3 mins sitting.  Educated pt on orthostatic hypotension vs medication effects -cautiouned to monitor symptoms and if reoccur return when up to sitting. Notified RN.    Exercises     Assessment/Plan    PT Assessment Patient needs continued PT services  PT Problem List Decreased strength;Decreased mobility;Decreased range of motion;Decreased activity tolerance;Decreased balance;Decreased knowledge of use of DME;Pain;Decreased knowledge of precautions       PT Treatment Interventions DME instruction;Therapeutic exercise;Gait training;Stair training;Functional mobility  training;Therapeutic activities;Patient/family education;Modalities;Balance training    PT Goals (Current goals can be found in the Care Plan section)  Acute Rehab PT Goals Patient Stated Goal: return home PT Goal Formulation: With patient/family Time For Goal Achievement: 03/31/24 Potential to Achieve Goals: Good    Frequency 7X/week     Co-evaluation               AM-PAC PT 6 Clicks Mobility  Outcome Measure Help needed turning from your back to your side while in a flat bed without using bedrails?: A Little Help needed moving from lying on your back to sitting on the side of a flat bed without using bedrails?: A Little Help needed moving to and from a bed to a chair (including a wheelchair)?: A Little Help needed standing up from a chair using your arms (e.g., wheelchair or bedside chair)?: A Little Help needed to walk in hospital room?: A Little Help needed climbing 3-5 steps with a railing? : A Little 6 Click Score: 18    End of Session Equipment Utilized During Treatment: Gait belt Activity Tolerance: Treatment limited secondary to medical complications (Comment)  Nurse Communication: Mobility status;Other (comment) PT Visit Diagnosis: Other abnormalities of gait and mobility (R26.89);Muscle weakness (generalized) (M62.81)    Time: 8389-8365 PT Time Calculation (min) (ACUTE ONLY): 24 min   Charges:   PT Evaluation $PT Eval Low Complexity: 1 Low PT Treatments $Gait Training: 8-22 mins PT General Charges $$ ACUTE PT VISIT: 1 Visit         Benjiman, PT Acute Rehab [facility] [phone]   Benjiman [name] 03/17/2024, 4:50 PM

## 2024-03-17 NOTE — Anesthesia Procedure Notes (Signed)
 Anesthesia Regional Block: Adductor canal block   Pre-Anesthetic Checklist: , timeout performed,  Correct Patient, Correct Site, Correct Laterality,  Correct Procedure, Correct Position, site marked,  Risks and benefits discussed,  Surgical consent,  Pre-op evaluation,  At surgeon's request and post-op pain management  Laterality: Right  Prep: chloraprep       Needles:  Injection technique: Single-shot  Needle Type: Echogenic Stimulator Needle     Needle Length: 9cm  Needle Gauge: 21     Additional Needles:   Procedures:,,,, ultrasound used (permanent image in chart),,    Narrative:  Start time: 03/17/2024 8:55 AM End time: 03/17/2024 9:00 AM Injection made incrementally with aspirations every 5 mL.  Performed by: Personally  Anesthesiologist: Tilford Franky BIRCH, MD  Additional Notes: Discussed risks and benefits of the nerve block in detail, including but not limited vascular injury, permanent nerve damage and infection.   Patient tolerated the procedure well. Local anesthetic introduced in an incremental fashion under minimal resistance after negative aspirations. No paresthesias were elicited. After completion of the procedure, no acute issues were identified and patient continued to be monitored by RN.

## 2024-03-17 NOTE — Discharge Instructions (Signed)

## 2024-03-17 NOTE — Anesthesia Procedure Notes (Signed)
 Spinal  Patient location during procedure: OR Reason for block: surgical anesthesia Staffing Performed: resident/CRNA  Anesthesiologist: Tilford Franky BIRCH, MD Resident/CRNA: Metta Andrea NOVAK, CRNA Performed by: Metta Andrea NOVAK, CRNA Authorized by: Tilford Franky BIRCH, MD   Preanesthetic Checklist Completed: patient identified, IV checked, site marked, risks and benefits discussed, surgical consent, monitors and equipment checked, pre-op evaluation and timeout performed Spinal Block Patient position: sitting Prep: DuraPrep and site prepped and draped Patient monitoring: heart rate, cardiac monitor, continuous pulse ox and blood pressure Approach: midline Location: L3-4 Injection technique: single-shot Needle Needle type: Pencan  Needle gauge: 24 G Needle length: 10 cm Assessment Events: CSF return Additional Notes Pt placed in sitting position, spinal kit expiration date checked and verified, timeout performed, + CSF, - heme, pt tolerated well. Dr Tilford present and supervising for SAB placement.

## 2024-03-17 NOTE — Op Note (Signed)
 NAME:  Tracey Evans                      MEDICAL RECORD NO.:  [id number]                             FACILITY:  [facility]      PHYSICIAN:  Donnice BIRCH. Ernie, M.D.  DATE OF BIRTH:  Mar 01, 1946      DATE OF PROCEDURE:  03/17/2024                                     OPERATIVE REPORT         PREOPERATIVE DIAGNOSIS:  Right knee osteoarthritis.      POSTOPERATIVE DIAGNOSIS:  Right knee osteoarthritis.      FINDINGS:  The patient was noted to have complete loss of cartilage and   bone-on-bone arthritis with associated osteophytes in the lateral and patellofemoral compartments of   the knee with a significant synovitis and associated effusion.  The patient had failed months of conservative treatment including medications, injection therapy, activity modification.     PROCEDURE:  Right total knee replacement.      COMPONENTS USED:  DePuy Attune FB CR MS knee   system, a size 4N femur, 4 tibia, size 8 mm FB CR MS AOX insert, and 35 anatomic patellar   button.      SURGEON:  Donnice BIRCH. Ernie, M.D.      ASSISTANT:  Rosina Calin, PA-C.      ANESTHESIA:  Regional and Spinal.      SPECIMENS:  None.      COMPLICATION:  None.      DRAINS:  None.  EBL: <500 cc      TOURNIQUET TIME:  No tourniquet used     The patient was stable to the recovery room.      INDICATION FOR PROCEDURE:  Tracey Evans is a 78 y.o. female patient of   mine.  The patient had been seen, evaluated, and treated for months conservatively in the   office with medication, activity modification, and injections.  The patient had   radiographic changes of bone-on-bone arthritis with endplate sclerosis and osteophytes noted.  Based on the radiographic changes and failed conservative measures, the patient   decided to proceed with definitive treatment, total knee replacement.  Risks of infection, DVT, component failure, need for revision surgery, neurovascular injury were reviewed in the office setting.  The postop  course was reviewed stressing the efforts to maximize post-operative satisfaction and function.  Consent was obtained for benefit of pain   relief.      PROCEDURE IN DETAIL:  The patient was brought to the operative theater.   Once adequate anesthesia, preoperative antibiotics, 2 gm of Ancef ,1 gm of Tranexamic Acid , and 10 mg of Decadron  administered, the patient was positioned supine with bony prominences safely padded and protected.  The  right lower extremity was prepped and draped in sterile fashion.  A time-   out was performed identifying the patient, planned procedure, and the appropriate extremity.      The right lower extremity was placed in the [facility] leg holder.  A midline incision was   made followed by median parapatellar arthrotomy.  Following initial   exposure, attention was first directed to the patella.  Precut   measurement was noted  to be 24 mm.  I resected down to 13-14 mm and used a   35 anatomic patellar button to restore patellar height as well as cover the cut surface.      The lug holes were drilled and a metal shim was placed to protect the   patella from retractors and saw blade during the procedure.      At this point, attention was now directed to the femur.  The femoral   canal was opened with a drill, irrigated to try to prevent fat emboli.  An   intramedullary rod was passed at 3 degrees valgus, 8 mm of bone was   resected off the distal femur.  Following this resection, the tibia was   subluxated anteriorly.  Using the extramedullary guide, 2 mm of bone was resected off   the proximal lateral tibia.  We confirmed the gap would be   stable medially and laterally with a size 5 spacer block as well as confirmed that the tibial cut was perpendicular in the coronal plane, checking with an alignment rod.      Once this was done, I sized the femur to be a size 4 in the anterior-   posterior dimension, chose a narrow component based on medial and   lateral  dimension.  The size 4 rotation block was then pinned in   position anterior referenced using the C-clamp to set rotation.  The   anterior, posterior, and  chamfer cuts were made without difficulty nor   notching making certain that I was along the anterior cortex to help   with flexion gap stability.      The final box cut was made off the lateral aspect of distal femur.      At this point, the tibia was sized to be a size 4.  The size 4 tray was   then pinned in position through the medial third of the tubercle,   drilled, and keel punched.  Trial reduction was now carried with a 4 femur,  4 tibia, a size 8 mm CR insert, and the 35 anatomic patella botton.  The knee was brought to full extension with good flexion stability with the patella   tracking through the trochlea without application of pressure.  Given   all these findings the trial components removed.  Final components were   opened and cement was mixed.  The knee was irrigated with normal saline solution and pulse lavage.  The synovial lining was   then injected with 30 cc of 0.25% Marcaine  with epinephrine , 1 cc of Toradol  and 30 cc of NS for a total of 61 cc.     Final implants were then cemented onto cleaned and dried cut surfaces of bone with the knee brought to extension with a size 8 mm CR trial insert.      Once the cement had fully cured, excess cement was removed   throughout the knee.  I confirmed that I was satisfied with the range of   motion and stability, and the final size 8 mm CR MS AOX insert was chosen.  It was   placed into the knee.     No significant   hemostasis was required at this point in the case.  The extensor mechanism was then reapproximated using #1 Vicryl and #1 Stratafix sutures with the knee   in flexion.  The   remaining wound was closed with 2-0 Vicryl and running 4-0 Monocryl.   The knee  was cleaned, dried, dressed sterilely using Dermabond and   Aquacel dressing.  The patient was then    brought to recovery room in stable condition, tolerating the procedure   well.   Please note that Physician Assistant, Rosina Calin, PA-C was present for the entirety of the case, and was utilized for pre-operative positioning, peri-operative retractor management, general facilitation of the procedure and for primary wound closure at the end of the case.              Donnice CORDOBA Ernie, M.D.    03/17/2024 8:47 AM

## 2024-03-17 NOTE — Interval H&P Note (Signed)
 History and Physical Interval Note:  03/17/2024 8:46 AM  Tracey Evans  has presented today for surgery, with the diagnosis of Right knee osteoarthritis.  The various methods of treatment have been discussed with the patient and family. After consideration of risks, benefits and other options for treatment, the patient has consented to  Procedure(s): ARTHROPLASTY, KNEE, TOTAL (Right) as a surgical intervention.  The patient's history has been reviewed, patient examined, no change in status, stable for surgery.  I have reviewed the patient's chart and labs.  Questions were answered to the patient's satisfaction.     [name]

## 2024-03-17 NOTE — Transfer of Care (Signed)
 Immediate Anesthesia Transfer of Care Note  Patient: Tracey Evans  Procedure(s) Performed: ARTHROPLASTY, KNEE, TOTAL (Right: Knee)  Patient Location: PACU  Anesthesia Type:MAC  Level of Consciousness: awake, alert , and patient cooperative  Airway & Oxygen Therapy: Patient Spontanous Breathing and Patient connected to face mask oxygen  Post-op Assessment: Report given to RN and Post -op Vital signs reviewed and stable  Post vital signs: Reviewed and stable  Last Vitals:  Vitals Value Taken Time  BP 109/66 03/17/24 11:37  Temp    Pulse 68 03/17/24 11:40  Resp 17 03/17/24 11:40  SpO2 99 % 03/17/24 11:40  Vitals shown include unfiled device data.  Last Pain:  Vitals:   03/17/24 0727  TempSrc:   PainSc: 7       Patients Stated Pain Goal: 4 (03/17/24 0727)  Complications: No notable events documented.

## 2024-03-17 NOTE — Anesthesia Postprocedure Evaluation (Addendum)
 Anesthesia Post Note  Patient: Tracey Evans  Procedure(s) Performed: ARTHROPLASTY, KNEE, TOTAL (Right: Knee)     Patient location during evaluation: PACU Anesthesia Type: Spinal Level of consciousness: oriented and awake and alert Pain management: pain level controlled Vital Signs Assessment: post-procedure vital signs reviewed and stable Respiratory status: spontaneous breathing, respiratory function stable and patient connected to nasal cannula oxygen Cardiovascular status: blood pressure returned to baseline and stable Postop Assessment: no headache, no backache and no apparent nausea or vomiting Anesthetic complications: no   No notable events documented.  Last Vitals:  Vitals:   03/17/24 1300 03/17/24 1318  BP: 115/68 113/81  Pulse: (!) 55 61  Resp: 12 15  Temp:  (!) 36.3 C  SpO2: 97% 96%              Franky JONETTA Bald

## 2024-03-17 NOTE — Anesthesia Procedure Notes (Signed)
 Procedure Name: MAC Date/Time: 03/17/2024 9:53 AM  Performed by: Metta Andrea NOVAK, CRNAPre-anesthesia Checklist: Patient identified, Emergency Drugs available, Suction available, Patient being monitored and Timeout performed Oxygen Delivery Method: Simple face mask Placement Confirmation: positive ETCO2

## 2024-03-17 NOTE — Care Plan (Signed)
 Ortho Bundle Case Management Note  Patient Details  Name: Tracey Evans MRN: [id number] Date of Birth: 1945-09-04  RT TKA on 03/17/24  DCP: Home with daugher   DME: RW; ordered through [employer]  PT: EO                   DME Arranged:  Vannie rolling DME Agency:  [employer]  HH Arranged:    HH Agency:     Additional Comments: Please contact me with any questions of if this plan should need to change.  Burnard Dross, Case Manager [facility] [phone]  Ext. [phone]   03/17/2024, 8:22 AM

## 2024-03-18 ENCOUNTER — Encounter (HOSPITAL_COMMUNITY): Payer: Self-pay | Admitting: Orthopedic Surgery

## 2024-03-18 LAB — BASIC METABOLIC PANEL WITH GFR
Anion gap: 8 (ref 5–15)
BUN: 18 mg/dL (ref 8–23)
CO2: 23 mmol/L (ref 22–32)
Calcium: 8.3 mg/dL — ABNORMAL LOW (ref 8.9–10.3)
Chloride: 106 mmol/L (ref 98–111)
Creatinine, Ser: 0.98 mg/dL (ref 0.44–1.00)
GFR, Estimated: 59 mL/min — ABNORMAL LOW (ref >60)
Glucose, Bld: 157 mg/dL — ABNORMAL HIGH (ref 70–99)
Potassium: 4.4 mmol/L (ref 3.5–5.1)
Sodium: 137 mmol/L (ref 135–145)

## 2024-03-18 LAB — CBC
HCT: 25 % — ABNORMAL LOW (ref 36.0–46.0)
Hemoglobin: 7.9 g/dL — ABNORMAL LOW (ref 12.0–15.0)
MCH: 30.3 pg (ref 26.0–34.0)
MCHC: 31.6 g/dL (ref 30.0–36.0)
MCV: 95.8 fL (ref 80.0–100.0)
Platelets: 264 K/uL (ref 150–400)
RBC: 2.61 MIL/uL — ABNORMAL LOW (ref 3.87–5.11)
RDW: 14.1 % (ref 11.5–15.5)
WBC: 9.4 K/uL (ref 4.0–10.5)
nRBC: 0 % (ref 0.0–0.2)

## 2024-03-18 MED ORDER — FERROUS SULFATE 325 (65 FE) MG PO TABS: 325.0000 mg | ORAL_TABLET | Freq: Two times a day (BID) | ORAL | 0 refills | Status: AC

## 2024-03-18 MED ORDER — TRAMADOL HCL 50 MG PO TABS: 50.0000 mg | ORAL_TABLET | Freq: Four times a day (QID) | ORAL | 0 refills | Status: DC | PRN

## 2024-03-18 MED ORDER — SODIUM CHLORIDE 0.9 % IV BOLUS
500.0000 mL | Freq: Once | INTRAVENOUS | Status: AC
  Administered 2024-03-18: 500 mL via INTRAVENOUS

## 2024-03-18 MED ORDER — TIZANIDINE HCL 4 MG PO TABS
2.0000 mg | ORAL_TABLET | Freq: Three times a day (TID) | ORAL | Status: DC | PRN
  Administered 2024-03-19 – 2024-03-20 (×3): 4 mg via ORAL
  Filled 2024-03-18 (×3): qty 1

## 2024-03-18 MED ORDER — ACETAMINOPHEN 500 MG PO TABS: 1000.0000 mg | ORAL_TABLET | Freq: Four times a day (QID) | ORAL | Status: AC

## 2024-03-18 MED ORDER — FERROUS SULFATE 325 (65 FE) MG PO TABS
325.0000 mg | ORAL_TABLET | Freq: Two times a day (BID) | ORAL | Status: DC
  Administered 2024-03-18 – 2024-03-20 (×5): 325 mg via ORAL
  Filled 2024-03-18 (×7): qty 1

## 2024-03-18 MED ORDER — ASPIRIN 81 MG PO CHEW: 81.0000 mg | CHEWABLE_TABLET | Freq: Two times a day (BID) | ORAL | 0 refills | Status: AC

## 2024-03-18 MED ORDER — TIZANIDINE HCL 2 MG PO CAPS: 2.0000 mg | ORAL_CAPSULE | Freq: Three times a day (TID) | ORAL | 2 refills | Status: AC | PRN

## 2024-03-18 MED ORDER — SENNA 8.6 MG PO TABS: 2.0000 | ORAL_TABLET | Freq: Every day | ORAL | 0 refills | Status: AC

## 2024-03-18 MED ORDER — SODIUM CHLORIDE 0.9 % IV BOLUS: 250.0000 mL | Freq: Once | INTRAVENOUS | Status: AC

## 2024-03-18 NOTE — Care Management Obs Status (Signed)
 MEDICARE OBSERVATION STATUS NOTIFICATION   Patient Details  Name: Tracey Evans MRN: [id number] Date of Birth: 1945/06/16   Medicare Observation Status Notification Given:  [name], LCSW 03/18/2024, 11:19 AM

## 2024-03-18 NOTE — Progress Notes (Signed)
 PT Cancellation Note  Patient Details Name: Tracey Evans MRN: [id number] DOB: 06-Jun-1945   Cancelled Treatment:    Reason Eval/Treat Not Completed: Medical issues which prohibited therapy Pt with soft BP and getting bolus.  Will f/u this morning after bolus.  Benjiman, PT Acute Rehab [facility] Re[facility] [phone]   Benjiman [name] 03/18/2024, 10:53 AM

## 2024-03-18 NOTE — Progress Notes (Signed)
 Physical Therapy Treatment Patient Details Name: Tracey Evans MRN: [id number] DOB: 09-05-1945 Today's Date: 03/18/2024   History of Present Illness Pt is 78 yo female s/p R TKA on 03/17/24.  Pt with hx including but not limited to breast CA, cervical ddd, fibromyalgia, depression, chemo therapy induced neuropathy, OA, back pain/sciatica, HTN, DM2    PT Comments  Pt is POD # 1 and is progressing well.  BP soft but stable, improved from morning, pt without syncopal symptoms throughout session.  Pt ambulated 100' and tolerated exercises well.  She likes to elevate leg on pillow - educated on not under knee and resting with leg straight. Pt demonstrates safe gait & transfers in order to return home from PT perspective once discharged by MD.  While in hospital, will continue to benefit from PT for skilled therapy to advance mobility and exercises.       If plan is discharge home, recommend the following: A little help with walking and/or transfers;A little help with bathing/dressing/bathroom;Assistance with cooking/housework;Help with stairs or ramp for entrance   Can travel by private vehicle        Equipment Recommendations  Rolling walker (2 wheels)    Recommendations for Other Services       Precautions / Restrictions Precautions Precautions: Knee;Fall Restrictions RLE Weight Bearing Per Provider Order: Weight bearing as tolerated     Mobility  Bed Mobility Overal bed mobility: Needs Assistance Bed Mobility: Supine to Sit, Sit to Supine     Supine to sit: Supervision Sit to supine: Supervision   General bed mobility comments: increased time; using UE to assist R LE    Transfers Overall transfer level: Needs assistance Equipment used: Rolling walker (2 wheels) Transfers: Sit to/from Stand Sit to Stand: Supervision                Ambulation/Gait Ambulation/Gait assistance: Supervision Gait Distance (Feet): 100 Feet Assistive device: Rolling walker (2  wheels) Gait Pattern/deviations: Step-through pattern, Decreased weight shift to right Gait velocity: functional     General Gait Details: Started CGA progressed to close supervision; steady with no LOB; no dizziness, no nausea, no hot flash; Pt tolerating step through well   Stairs Stairs:  (level entry home)           Wheelchair Mobility     Tilt Bed    Modified Rankin (Stroke Patients Only)       Balance Overall balance assessment: Needs assistance Sitting-balance support: No upper extremity supported Sitting balance-Leahy Scale: Good     Standing balance support: Bilateral upper extremity supported, No upper extremity supported Standing balance-Leahy Scale: Fair Standing balance comment: steady with RW to ambulate; could stand without support                            Communication    Cognition Arousal: Alert Behavior During Therapy: WFL for tasks assessed/performed   PT - Cognitive impairments: No apparent impairments                                Cueing    Exercises Total Joint Exercises Ankle Circles/Pumps: AROM, Both, 10 reps, Supine Quad Sets: AROM, Both, 10 reps, Supine Heel Slides: AAROM, Right, 5 reps, Supine Hip ABduction/ADduction: AAROM, Right, 5 reps, Supine Long Arc Quad: AROM, Right, 10 reps, Seated Knee Flexion: AROM, Right, 10 reps, Seated Goniometric ROM: R knee 5 to 75  degrees    General Comments General comments (skin integrity, edema, etc.): Spoke with nurse who reports staying to check Hgb in morning.  BP 90/58 sitting and 92/57 standing.  Pt tolerated well  Educated on safe ice use, no pivots, car transfers, resting with leg straight, and TED hose during day. Also, encouraged walking every 1-2 hours during day. Educated on HEP with focus on mobility the first weeks. Discussed doing exercises within pain control and if pain increasing could decreased ROM, reps, and stop exercises as needed. Encouraged to  perform quad sets and ankle pumps frequently for blood flow and to promote full knee extension.       Pertinent Vitals/Pain Pain Assessment Pain Assessment: 0-10 Pain Score: 3  Pain Location: posterior R knee Pain Descriptors / Indicators: Discomfort Pain Intervention(s): Limited activity within patient's tolerance, Monitored during session, Premedicated before session, Repositioned, Ice applied    Home Living                          Prior Function            PT Goals (current goals can now be found in the care plan section) Progress towards PT goals: Progressing toward goals    Frequency    7X/week      PT Plan      Co-evaluation              AM-PAC PT 6 Clicks Mobility   Outcome Measure  Help needed turning from your back to your side while in a flat bed without using bedrails?: A Little Help needed moving from lying on your back to sitting on the side of a flat bed without using bedrails?: A Little Help needed moving to and from a bed to a chair (including a wheelchair)?: A Little Help needed standing up from a chair using your arms (e.g., wheelchair or bedside chair)?: A Little Help needed to walk in hospital room?: A Little Help needed climbing 3-5 steps with a railing? : A Little 6 Click Score: 18    End of Session Equipment Utilized During Treatment: Gait belt Activity Tolerance: Patient tolerated treatment well (soft BP but asymptomatic) Patient left: with call bell/phone within reach;in bed;with bed alarm set;with SCD's reapplied Nurse Communication: Mobility status PT Visit Diagnosis: Other abnormalities of gait and mobility (R26.89);Muscle weakness (generalized) (M62.81)     Time: 8387-8364 PT Time Calculation (min) (ACUTE ONLY): 23 min  Charges:    $Gait Training: 8-22 mins $Therapeutic Exercise: 8-22 mins PT General Charges $$ ACUTE PT VISIT: 1 Visit                     Benjiman, PT Acute Rehab Services [facility] Rehab  [phone]    Benjiman [name] 03/18/2024, 4:46 PM

## 2024-03-18 NOTE — TOC Transition Note (Signed)
 Transition of Care [facility]) - Discharge Note   Patient Details  Name: Tracey Evans MRN: [id number] Date of Birth: March 26, 1946  Transition of Care [facility]) CM/SW Contact:  NORMAN ASPEN, LCSW Phone Number: 03/18/2024, 9:47 AM   Clinical Narrative:     Met with pt and confirming she has received RW to room via [employer].  OPPT prearranged with [facility].  No further IP CM needs.    Final next level of care: OP Rehab Barriers to Discharge: No Barriers Identified   Patient Goals and CMS Choice Patient states their goals for this hospitalization and ongoing recovery are:: return home          Discharge Placement                       Discharge Plan and Services Additional resources added to the After Visit Summary for                  DME Arranged: Walker rolling DME Agency: [employer]                  Social Drivers of Health (SDOH) Interventions SDOH Screenings   Food Insecurity: No Food Insecurity (03/17/2024)  Housing: Low Risk  (03/17/2024)  Transportation Needs: No Transportation Needs (03/17/2024)  Utilities: Not At Risk (03/17/2024)  Alcohol  Screen: Low Risk  (10/10/2022)  Depression (PHQ2-9): Low Risk  (01/03/2024)  Financial Resource Strain: Low Risk  (01/27/2024)  Physical Activity: Inactive (01/27/2024)  Social Connections: Moderately Integrated (03/17/2024)  Stress: No Stress Concern Present (01/27/2024)  Tobacco Use: Low Risk  (03/17/2024)     Readmission Risk Interventions    03/11/2023   10:22 AM  Readmission Risk Prevention Plan  Transportation Screening Complete  PCP or Specialist Appt within 5-7 Days Complete  Home Care Screening Complete  Medication Review (RN CM) Complete

## 2024-03-18 NOTE — Plan of Care (Signed)
  Problem: Education: Goal: Knowledge of General Education information will improve Description: Including pain rating scale, medication(s)/side effects and non-pharmacologic comfort measures Outcome: Progressing   Problem: Activity: Goal: Risk for activity intolerance will decrease Outcome: Progressing   Problem: Nutrition: Goal: Adequate nutrition will be maintained Outcome: Progressing   Problem: Coping: Goal: Level of anxiety will decrease Outcome: Progressing   Problem: Elimination: Goal: Will not experience complications related to urinary retention Outcome: Progressing   

## 2024-03-18 NOTE — Progress Notes (Signed)
 Subjective: 1 Day Post-Op Procedure(s) (LRB): ARTHROPLASTY, KNEE, TOTAL (Right) Patient reports pain as mild.   Patient seen in rounds for Dr. Ernie. Patient is well, and has had no acute complaints or problems. No acute events overnight. Foley catheter removed. Patient ambulated 40 feet with PT. She did have some dizziness with PT.   We will start therapy today.   Objective: Vital signs in last 24 hours: Temp:  [97.3 F (36.3 C)-98.5 F (36.9 C)] 97.7 F (36.5 C) (12/10 0556) Pulse Rate:  [55-85] 70 (12/10 0556) Resp:  [10-26] 18 (12/10 0556) BP: (97-157)/(60-120) 157/120 (12/10 0556) SpO2:  [92 %-100 %] 98 % (12/10 0556)  Intake/Output from previous day:  Intake/Output Summary (Last 24 hours) at 03/18/2024 0840 Last data filed at 03/18/2024 0607 Gross per 24 hour  Intake 3069.35 ml  Output 2150 ml  Net 919.35 ml     Intake/Output this shift: No intake/output data recorded.  Labs: Recent Labs    03/18/24 0346  HGB 7.9*   Recent Labs    03/18/24 0346  WBC 9.4  RBC 2.61*  HCT 25.0*  PLT 264   Recent Labs    03/18/24 0346  NA 137  K 4.4  CL 106  CO2 23  BUN 18  CREATININE 0.98  GLUCOSE 157*  CALCIUM  8.3*   No results for input(s): LABPT, INR in the last 72 hours.  Exam: General - Patient is Alert and Oriented Extremity - Neurologically intact Sensation intact distally Intact pulses distally Dorsiflexion/Plantar flexion intact Dressing - dressing C/D/I Motor Function - intact, moving foot and toes well on exam.   Past Medical History:  Diagnosis Date   Angina at rest    occurs whenever it wants to, but worse during agitation   Anxiety    Asthma    Binge eating disorder    Breast cancer of lower-outer quadrant of left female breast (HCC) 07/08/2015   NEVER HAD LEFT BREAST CANCER (08/15/2015)   Cancer of right breast (HCC) 08/15/2015   Chronic bronchitis (HCC)    Chronic lower back pain    Complication of anesthesia    had bronc  spasms during intubation surgery 2009 on foot-need albuterol  inhaler or neb tx preop   Costochondritis    Depression    Diverticular disease    11-2020 bleed   Fibromyalgia    Hot flashes    Hyperlipidemia    Hypertension    Hypothyroidism    Osteoarthritis    all over   Peptic ulcer disease    Personal history of chemotherapy 2017   Pre-diabetes     Assessment/Plan: 1 Day Post-Op Procedure(s) (LRB): ARTHROPLASTY, KNEE, TOTAL (Right) Principal Problem:   S/P total knee arthroplasty, right  Estimated body mass index is 26.53 kg/m as calculated from the following:   Height as of this encounter: 5' 4.5 (1.638 m).   Weight as of this encounter: 71.2 kg. Advance diet Up with therapy D/C IV fluids   Patient's anticipated LOS is less than 2 midnights, meeting these requirements: - Younger than 83 - Lives within 1 hour of care - Has a competent adult at home to recover with post-op recover - NO history of  - Chronic pain requiring opiods  - Diabetes  - Coronary Artery Disease  - Heart failure  - Heart attack  - Stroke  - DVT/VTE  - Cardiac arrhythmia  - Respiratory Failure/COPD  - Renal failure  - Anemia  - Advanced Liver disease  DVT Prophylaxis - Aspirin  Weight bearing as tolerated.  ABLA on chronic anemia: Hgb 11.7>7.9 Will send home with PO iron  Plan is to go Home after hospital stay. Plan for discharge today following 1-2 sessions of PT as long as they are meeting their goals. Patient is scheduled for OPPT. Follow up in the office in 2 weeks.   Rosina Calin, PA-C Orthopedic Surgery [phone] 03/18/2024, 8:40 AM

## 2024-03-18 NOTE — Plan of Care (Signed)
  Problem: Education: Goal: Knowledge of General Education information will improve Description: Including pain rating scale, medication(s)/side effects and non-pharmacologic comfort measures Outcome: Progressing   Problem: Health Behavior/Discharge Planning: Goal: Ability to manage health-related needs will improve Outcome: Progressing   Problem: Clinical Measurements: Goal: Ability to maintain clinical measurements within normal limits will improve Outcome: Progressing Goal: Will remain free from infection Outcome: Progressing Goal: Diagnostic test results will improve Outcome: Progressing Goal: Respiratory complications will improve Outcome: Progressing Goal: Cardiovascular complication will be avoided Outcome: Progressing   Problem: Activity: Goal: Risk for activity intolerance will decrease Outcome: Progressing   Problem: Nutrition: Goal: Adequate nutrition will be maintained Outcome: Progressing   Problem: Coping: Goal: Level of anxiety will decrease Outcome: Progressing   Problem: Elimination: Goal: Will not experience complications related to bowel motility Outcome: Progressing Goal: Will not experience complications related to urinary retention Outcome: Progressing   Problem: Pain Managment: Goal: General experience of comfort will improve and/or be controlled Outcome: Progressing   Problem: Safety: Goal: Ability to remain free from injury will improve Outcome: Progressing   Problem: Skin Integrity: Goal: Risk for impaired skin integrity will decrease Outcome: Progressing   Problem: Education: Goal: Knowledge of the prescribed therapeutic regimen will improve Outcome: Progressing Goal: Individualized Educational Video(s) Outcome: Progressing   Problem: Activity: Goal: Ability to avoid complications of mobility impairment will improve Outcome: Progressing Goal: Range of joint motion will improve Outcome: Progressing   Problem: Clinical  Measurements: Goal: Postoperative complications will be avoided or minimized Outcome: Progressing   Problem: Pain Management: Goal: Pain level will decrease with appropriate interventions Outcome: Progressing   Problem: Skin Integrity: Goal: Will show signs of wound healing Outcome: Progressing

## 2024-03-18 NOTE — Progress Notes (Signed)
 Physical Therapy Treatment Patient Details Name: Tracey Evans MRN: [id number] DOB: March 08, 1946 Today's Date: 03/18/2024   History of Present Illness Pt is 79 yo female s/p R TKA on 03/17/24.  Pt with hx including but not limited to breast CA, cervical ddd, fibromyalgia, depression, chemo therapy induced neuropathy, OA, back pain/sciatica, HTN, DM2    PT Comments  Pt is POD # 1 and is progressing well.  Pt has home support, RW delivered to room, and no stairs at home.  She has good quad activation, ROM, and pain control.  Pt able to ambulate 100' with RW and supervision without difficulty.  Demonstrates good safety awareness and understanding of HEP.  Pt did have soft BP even after bolus , but was completely asymptomatic during session.  Did notify RN who notified PA.  Pt clearing therapy but will f/u if remains hospitalized. Pt demonstrates safe gait & transfers in order to return home from PT perspective once discharged by MD.  While in hospital, will continue to benefit from PT for skilled therapy to advance mobility and exercises.    BP as follows:  Supine 85/53, MAP 64, HR 73 Sitting 82/58, MAP 67, HR 79 Standing 83/57 MAP 66, HR 81 Sitting post walk 92/48, MAP 62, HR 88      If plan is discharge home, recommend the following: A little help with walking and/or transfers;A little help with bathing/dressing/bathroom;Assistance with cooking/housework;Help with stairs or ramp for entrance   Can travel by private vehicle        Equipment Recommendations  Rolling walker (2 wheels)    Recommendations for Other Services       Precautions / Restrictions Precautions Precautions: Knee;Fall Restrictions RLE Weight Bearing Per Provider Order: Weight bearing as tolerated     Mobility  Bed Mobility Overal bed mobility: Needs Assistance Bed Mobility: Supine to Sit     Supine to sit: Supervision          Transfers Overall transfer level: Needs assistance Equipment used:  Rolling walker (2 wheels) Transfers: Sit to/from Stand Sit to Stand: Supervision                Ambulation/Gait Ambulation/Gait assistance: Contact guard assist, Supervision Gait Distance (Feet): 100 Feet Assistive device: Rolling walker (2 wheels) Gait Pattern/deviations: Step-through pattern, Decreased weight shift to right Gait velocity: functional     General Gait Details: Started CGA progressed to close supervision; steady with no LOB; no dizziness, no nausea, no hot flash; pt asking about walking stiff legged vs bending knee.  Discussed could walk as normal as possible but if to painful could do step to gait with less knee flexion.  Pt tolerating step through well   Stairs Stairs:  (level entry home)           Wheelchair Mobility     Tilt Bed    Modified Rankin (Stroke Patients Only)       Balance Overall balance assessment: Needs assistance Sitting-balance support: No upper extremity supported Sitting balance-Leahy Scale: Good     Standing balance support: Bilateral upper extremity supported, No upper extremity supported Standing balance-Leahy Scale: Fair Standing balance comment: steady with RW to ambulate; could stand without support                            Communication    Cognition Arousal: Alert Behavior During Therapy: WFL for tasks assessed/performed   PT - Cognitive impairments: No apparent impairments  Cueing    Exercises Total Joint Exercises Ankle Circles/Pumps: AROM, Both, 10 reps, Supine Quad Sets: AROM, Both, 10 reps, Supine Heel Slides: AAROM, Right, 5 reps, Supine Hip ABduction/ADduction: AAROM, Right, 5 reps, Supine Long Arc Quad: AROM, Right, 10 reps, Seated Knee Flexion: AROM, Right, 10 reps, Seated Goniometric ROM: R knee 5 to 75 degrees    General Comments        Pertinent Vitals/Pain Pain Assessment Pain Assessment: No/denies pain Pain Score: 3  Pain  Location: posterior R knee Pain Descriptors / Indicators: Discomfort Pain Intervention(s): Limited activity within patient's tolerance, Monitored during session, Repositioned, Ice applied (declined pain meds prior)    Home Living                          Prior Function            PT Goals (current goals can now be found in the care plan section) Progress towards PT goals: Progressing toward goals    Frequency    7X/week      PT Plan      Co-evaluation              AM-PAC PT 6 Clicks Mobility   Outcome Measure  Help needed turning from your back to your side while in a flat bed without using bedrails?: A Little Help needed moving from lying on your back to sitting on the side of a flat bed without using bedrails?: A Little Help needed moving to and from a bed to a chair (including a wheelchair)?: A Little Help needed standing up from a chair using your arms (e.g., wheelchair or bedside chair)?: A Little Help needed to walk in hospital room?: A Little Help needed climbing 3-5 steps with a railing? : A Little 6 Click Score: 18    End of Session Equipment Utilized During Treatment: Gait belt Activity Tolerance: Patient tolerated treatment well (soft BP but asymptomatic) Patient left: with call bell/phone within reach;in chair Nurse Communication: Mobility status PT Visit Diagnosis: Other abnormalities of gait and mobility (R26.89);Muscle weakness (generalized) (M62.81)     Time: 8880-8789 PT Time Calculation (min) (ACUTE ONLY): 51 min  Charges:    $Gait Training: 8-22 mins $Therapeutic Exercise: 8-22 mins $Therapeutic Activity: 8-22 mins PT General Charges $$ ACUTE PT VISIT: 1 Visit                     Benjiman, PT Acute Rehab [facility] [phone]    Benjiman [name] 03/18/2024, 12:24 PM

## 2024-03-19 ENCOUNTER — Other Ambulatory Visit: Payer: Self-pay

## 2024-03-19 ENCOUNTER — Other Ambulatory Visit (HOSPITAL_COMMUNITY): Payer: Self-pay

## 2024-03-19 LAB — CBC
HCT: 21.4 % — ABNORMAL LOW (ref 36.0–46.0)
Hemoglobin: 7.1 g/dL — ABNORMAL LOW (ref 12.0–15.0)
MCH: 31.1 pg (ref 26.0–34.0)
MCHC: 33.2 g/dL (ref 30.0–36.0)
MCV: 93.9 fL (ref 80.0–100.0)
Platelets: 238 K/uL (ref 150–400)
RBC: 2.28 MIL/uL — ABNORMAL LOW (ref 3.87–5.11)
RDW: 14.4 % (ref 11.5–15.5)
WBC: 8.4 K/uL (ref 4.0–10.5)
nRBC: 0 % (ref 0.0–0.2)

## 2024-03-19 LAB — PREPARE RBC (CROSSMATCH)

## 2024-03-19 MED ORDER — CHLORHEXIDINE GLUCONATE CLOTH 2 % EX PADS
6.0000 | MEDICATED_PAD | Freq: Every day | CUTANEOUS | Status: DC
  Administered 2024-03-19 – 2024-03-20 (×2): 6 via TOPICAL

## 2024-03-19 MED ORDER — TRAMADOL HCL 50 MG PO TABS
50.0000 mg | ORAL_TABLET | Freq: Three times a day (TID) | ORAL | Status: DC | PRN
  Administered 2024-03-19 – 2024-03-20 (×2): 100 mg via ORAL
  Filled 2024-03-19 (×2): qty 2

## 2024-03-19 MED ORDER — SODIUM CHLORIDE 0.9% FLUSH
10.0000 mL | Freq: Two times a day (BID) | INTRAVENOUS | Status: DC
  Administered 2024-03-19: 10 mL

## 2024-03-19 MED ORDER — SODIUM CHLORIDE 0.9% IV SOLUTION: Freq: Once | INTRAVENOUS | Status: DC

## 2024-03-19 MED ORDER — SODIUM CHLORIDE 0.9% FLUSH: 10.0000 mL | INTRAVENOUS | Status: DC | PRN

## 2024-03-19 MED ORDER — OXYCODONE HCL 5 MG PO TABS
5.0000 mg | ORAL_TABLET | ORAL | Status: DC | PRN
  Administered 2024-03-19: 5 mg via ORAL
  Administered 2024-03-19: 10 mg via ORAL
  Administered 2024-03-19 – 2024-03-20 (×2): 5 mg via ORAL
  Filled 2024-03-19 (×2): qty 2
  Filled 2024-03-19 (×3): qty 1

## 2024-03-19 NOTE — Progress Notes (Addendum)
 Peripherally Inserted Central Catheter Placement  The IV Nurse has discussed with the patient and/or persons authorized to consent for the patient, the purpose of this procedure and the potential benefits and risks involved with this procedure.  The benefits include less needle sticks, lab draws from the catheter, and the patient may be discharged home with the catheter. Risks include, but not limited to, infection, bleeding, blood clot (thrombus formation), and puncture of an artery; nerve damage and irregular heartbeat and possibility to perform a PICC exchange if needed/ordered by physician.  Alternatives to this procedure were also discussed.  Bard Power PICC patient education guide, fact sheet on infection prevention and patient information card has been provided to patient /or left at bedside.    PICC Placement Documentation  PICC Single Lumen 03/19/24 Left Brachial 40 cm 0 cm (Active)  Indication for Insertion or Continuance of Line Limited venous access - need for IV therapy >5 days (PICC only) 03/19/24 1901  Exposed Catheter (cm) 0 cm 03/19/24 1901  Site Assessment Clean, Dry, Intact 03/19/24 1901  Line Status Flushed;Blood return noted;Saline locked 03/19/24 1901  Dressing Type Transparent 03/19/24 1901  Dressing Status Antimicrobial disc/dressing in place 03/19/24 1901  Line Care Connections checked and tightened 03/19/24 1901  Line Adjustment (NICU/IV Team Only) No 03/19/24 1901  Dressing Intervention New dressing 03/19/24 1901  Dressing Change Due 03/26/24 03/19/24 1901   Right Arm restricted , Left forearm to mid Upper arm swollen before PICC insertion.    Aron Singh Ramos 03/19/2024, 7:03 PM

## 2024-03-19 NOTE — Progress Notes (Signed)
 Physical Therapy Treatment Patient Details Name: Tracey Evans MRN: [id number] DOB: October 23, 1945 Today's Date: 03/19/2024   History of Present Illness Pt is 78 yo female s/p R TKA on 03/17/24.  Pt with hx including but not limited to breast CA, cervical ddd, fibromyalgia, depression, chemo therapy induced neuropathy, OA, back pain/sciatica, HTN, DM2    PT Comments   The patient was standing at bed edge,, daughter present, bed alarming. Patient had ambulated  from BR with assist from daughter. Patient reporting 8/10 pain right knee. RN aware.Assisted patient into bed, placed ice to right knee, PAS  applied.  Patient declined any exercises due to pain. Has ultram  only on board. Patient waiting on 1 unit of blood.  Continue  PT tomorrow.    If plan is discharge home, recommend the following: A little help with walking and/or transfers;A little help with bathing/dressing/bathroom;Assistance with cooking/housework;Help with stairs or ramp for entrance   Can travel by private vehicle        Equipment Recommendations  Rolling walker (2 wheels)    Recommendations for Other Services       Precautions / Restrictions Precautions Precautions: Knee;Fall Restrictions Weight Bearing Restrictions Per Provider Order: No RLE Weight Bearing Per Provider Order: Weight bearing as tolerated     Mobility  Bed Mobility   Bed Mobility: Sit to Supine       Sit to supine: Mod assist   General bed mobility comments: min assist  to support the right leg onto bed, patient very guarded.    Transfers Overall transfer level: Needs assistance Equipment used: Rolling walker (2 wheels) Transfers: Sit to/from Stand Sit to Stand: Min assist           General transfer comment: steady assist, patient  fumbling, appears not concentrating on technique, cues for safety    Ambulation/Gait               General Gait Details: Patient had ambulated to BR with RN and daughter had assisted to  ambulate back to bed, PT  entered  room  when patient standing at bed edge, bed alarming.   Stairs             Wheelchair Mobility     Tilt Bed    Modified Rankin (Stroke Patients Only)       Balance Overall balance assessment: Needs assistance Sitting-balance support: No upper extremity supported Sitting balance-Leahy Scale: Good     Standing balance support: Bilateral upper extremity supported, No upper extremity supported Standing balance-Leahy Scale: Fair                              Hotel Manager: No apparent difficulties  Cognition Arousal: Alert Behavior During Therapy: WFL for tasks assessed/performed, Anxious   PT - Cognitive impairments: No apparent impairments                       PT - Cognition Comments: dtr present, anxious due to reports of  pain in the knee Following commands: Intact      Cueing    Exercises      General Comments        Pertinent Vitals/Pain Pain Assessment Pain Score: 8  Pain Location: right knee Pain Descriptors / Indicators: Grimacing, Guarding, Moaning Pain Intervention(s): Monitored during session, Limited activity within patient's tolerance, Repositioned    Home Living  Prior Function            PT Goals (current goals can now be found in the care plan section) Progress towards PT goals: Progressing toward goals    Frequency    7X/week      PT Plan      Co-evaluation              AM-PAC PT 6 Clicks Mobility   Outcome Measure  Help needed turning from your back to your side while in a flat bed without using bedrails?: A Little Help needed moving from lying on your back to sitting on the side of a flat bed without using bedrails?: A Lot Help needed moving to and from a bed to a chair (including a wheelchair)?: A Little Help needed standing up from a chair using your arms (e.g., wheelchair or bedside  chair)?: A Little Help needed to walk in hospital room?: A Little Help needed climbing 3-5 steps with a railing? : A Little 6 Click Score: 17    End of Session   Activity Tolerance: Patient limited by pain Patient left: in bed;with call bell/phone within reach;with bed alarm set;with family/visitor present Nurse Communication: Mobility status PT Visit Diagnosis: Other abnormalities of gait and mobility (R26.89);Muscle weakness (generalized) (M62.81)     Time: 8663-8647 PT Time Calculation (min) (ACUTE ONLY): 16 min  Charges:    $Therapeutic Activity: 8-22 mins PT General Charges $$ ACUTE PT VISIT: 1 Visit                    Darice Potters PT Acute Rehabilitation Services Office [phone]   Potters Darice Norris 03/19/2024, 2:17 PM

## 2024-03-19 NOTE — Progress Notes (Signed)
 Subjective: 2 Days Post-Op Procedures (LRB): ARTHROPLASTY, KNEE, TOTAL (Right) Patient reports pain as moderate to severe.   Patient seen in rounds for Dr. Ernie. Patient resting in bed this morning. She appears very uncomfortable and reports significant pain today and overnight in her knee. We agreed on changing her pain medication. We discussed her hemoglobin and low blood pressure and she agrees with blood transfusion today. We will continue therapy today.   Objective: Vital signs in last 24 hours: Temp:  [98.1 F (36.7 C)-98.8 F (37.1 C)] 98.8 F (37.1 C) (12/11 0548) Pulse Rate:  [72-85] 85 (12/11 0548) Resp:  [14-19] 19 (12/11 0548) BP: (88-100)/(55-61) 94/61 (12/11 0548) SpO2:  [94 %-99 %] 94 % (12/11 0548)  Intake/Output from previous day:  Intake/Output Summary (Last 24 hours) at 03/19/2024 0659 Last data filed at 03/18/2024 1719 Gross per 24 hour  Intake 1391.39 ml  Output --  Net 1391.39 ml     Intake/Output this shift: No intake/output data recorded.  Labs: Recent Labs    03/18/24 0346 03/19/24 0343  HGB 7.9* 7.1*   Recent Labs    03/18/24 0346 03/19/24 0343  WBC 9.4 8.4  RBC 2.61* 2.28*  HCT 25.0* 21.4*  PLT 264 238   Recent Labs    03/18/24 0346  NA 137  K 4.4  CL 106  CO2 23  BUN 18  CREATININE 0.98  GLUCOSE 157*  CALCIUM  8.3*   No results for input(s): LABPT, INR in the last 72 hours.  Exam: General - Patient is Alert and Oriented Extremity - Neurologically intact Sensation intact distally Intact pulses distally Dorsiflexion/Plantar flexion intact Dressing - dressing C/D/I Motor Function - intact, moving foot and toes well on exam.   Past Medical History:  Diagnosis Date   Angina at rest    occurs whenever it wants to, but worse during agitation   Anxiety    Asthma    Binge eating disorder    Breast cancer of lower-outer quadrant of left female breast (HCC) 07/08/2015   NEVER HAD LEFT BREAST CANCER (08/15/2015)    Cancer of right breast (HCC) 08/15/2015   Chronic bronchitis (HCC)    Chronic lower back pain    Complication of anesthesia    had bronc spasms during intubation surgery 2009 on foot-need albuterol  inhaler or neb tx preop   Costochondritis    Depression    Diverticular disease    11-2020 bleed   Fibromyalgia    Hot flashes    Hyperlipidemia    Hypertension    Hypothyroidism    Osteoarthritis    all over   Peptic ulcer disease    Personal history of chemotherapy 2017   Pre-diabetes     Assessment/Plan: 2 Days Post-Op Procedures (LRB): ARTHROPLASTY, KNEE, TOTAL (Right) Principal Problem:   S/P total knee arthroplasty, right  Estimated body mass index is 26.53 kg/m as calculated from the following:   Height as of this encounter: 5' 4.5 (1.638 m).   Weight as of this encounter: 71.2 kg. Advance diet Up with therapy   DVT Prophylaxis - Aspirin  Weight bearing as tolerated.  ABLA: Hgb 7.1 this AM with hypotension yesterday and today Will give 2 units PRBC today  Wanted to change to dilaudid  for pain, but she has a reported allergy which was most likely more of an adverse reaction Will try oxycodone , but she does have nausea with this so may require premedication  Possible d/c home tomorrow if improved  Rosina Calin, PA-C Orthopedic  Surgery [phone] 03/19/2024, 6:59 AM

## 2024-03-19 NOTE — Plan of Care (Signed)

## 2024-03-20 ENCOUNTER — Other Ambulatory Visit (HOSPITAL_COMMUNITY): Payer: Self-pay

## 2024-03-20 LAB — TYPE AND SCREEN
ABO/RH(D): O POS
Antibody Screen: NEGATIVE
Unit division: 0
Unit division: 0

## 2024-03-20 LAB — BPAM RBC
Blood Product Expiration Date: 202601042359
Blood Product Expiration Date: 202601042359
ISSUE DATE / TIME: 202512111944
ISSUE DATE / TIME: 202512112253
Unit Type and Rh: 5100
Unit Type and Rh: 5100

## 2024-03-20 LAB — HEMOGLOBIN AND HEMATOCRIT, BLOOD
HCT: 27.5 % — ABNORMAL LOW (ref 36.0–46.0)
Hemoglobin: 9 g/dL — ABNORMAL LOW (ref 12.0–15.0)

## 2024-03-20 MED ORDER — OXYCODONE HCL 5 MG PO TABS
5.0000 mg | ORAL_TABLET | ORAL | 0 refills | Status: AC | PRN
  Filled 2024-03-20: qty 42, 7d supply, fill #0

## 2024-03-20 NOTE — Progress Notes (Signed)
 PICC Removal Note: PICC line removed from LUE. PICC catheter tip visualized and intact. Pressure dressing applied. No redness, ecchymosis, edema, swelling, or drainage noted at site. Instructions provided on post PICC discharge care, including followup notification instructions. Bed rest until 2 pm

## 2024-03-20 NOTE — Care Management Important Message (Signed)
 Important Message  Patient Details  Name: Tracey Evans MRN: [id number] Date of Birth: 08-01-1945   Important Message Given:  N/A - LOS <3 / Initial given by admissions     [name] 03/20/2024, 1:52 PM

## 2024-03-20 NOTE — Plan of Care (Signed)
   Problem: Education: Goal: Knowledge of General Education information will improve Description Including pain rating scale, medication(s)/side effects and non-pharmacologic comfort measures Outcome: Progressing

## 2024-03-20 NOTE — Progress Notes (Signed)
 Physical Therapy Treatment Patient Details Name: Tracey Evans MRN: [id number] DOB: 05/21/1945 Today's Date: 03/20/2024   History of Present Illness Pt is 78 yo female s/p R TKA on 03/17/24.  Pt with hx including but not limited to breast CA, cervical ddd, fibromyalgia, depression, chemo therapy induced neuropathy, OA, back pain/sciatica, HTN, DM2    PT Comments  Pt is POD # 3 and is progressing well. Pt with much improved pain today and reports feels more like self after receiving 2 units of PRBC yesterday.  She is demonstrating improved ROM and with good quad activation.  Pt safely ambulating 120' and has a level entry home.  Pt has support at home, RW delivered to room, and good understanding of HEP with outpt PT scheduled.   Pt demonstrates safe gait & transfers in order to return home from PT perspective once discharged by MD.  While in hospital, will continue to benefit from PT for skilled therapy to advance mobility and exercises.       If plan is discharge home, recommend the following: A little help with walking and/or transfers;A little help with bathing/dressing/bathroom;Assistance with cooking/housework;Help with stairs or ramp for entrance   Can travel by private vehicle        Equipment Recommendations  Rolling walker (2 wheels)    Recommendations for Other Services       Precautions / Restrictions Precautions Precautions: Knee;Fall Restrictions RLE Weight Bearing Per Provider Order: Weight bearing as tolerated     Mobility  Bed Mobility Overal bed mobility: Needs Assistance Bed Mobility: Supine to Sit, Sit to Supine     Supine to sit: Supervision, HOB elevated Sit to supine: Supervision, HOB elevated   General bed mobility comments: increased time but no assist; pt self assisting R LE with gait belt or hands    Transfers Overall transfer level: Needs assistance Equipment used: Rolling walker (2 wheels) Transfers: Sit to/from Stand Sit to Stand:  Supervision           General transfer comment: STS x 2, good hand placement, did provide cues for R LE management with sitting for improved control and pain control    Ambulation/Gait Ambulation/Gait assistance: Supervision Gait Distance (Feet): 120 Feet Assistive device: Rolling walker (2 wheels) Gait Pattern/deviations: Step-to pattern, Decreased stride length, Decreased weight shift to right Gait velocity: functional     General Gait Details: Tolerated well, steady, one cue for RW proximity with good carry over. Pt asking about should she flex knee or walk stiff legged.  Encouraged normal ambulation as able but if painful could do more stiff.  Did encourage to focus on lifting toes and heel strike for safe gait.  Pt with fairly smooth, only minimally antalgic patter for POD 3   Stairs             Wheelchair Mobility     Tilt Bed    Modified Rankin (Stroke Patients Only)       Balance Overall balance assessment: Needs assistance Sitting-balance support: No upper extremity supported Sitting balance-Leahy Scale: Good     Standing balance support: Bilateral upper extremity supported, No upper extremity supported Standing balance-Leahy Scale: Fair Standing balance comment: steady with RW to ambulate; could stand without support                            Communication    Cognition Arousal: Alert Behavior During Therapy: WFL for tasks assessed/performed   PT -  Cognitive impairments: No apparent impairments                                Cueing    Exercises Total Joint Exercises Ankle Circles/Pumps: AROM, Both, 10 reps, Supine Quad Sets: AROM, Both, 10 reps, Supine Heel Slides: AAROM, Right, 5 reps, Supine Hip ABduction/ADduction: AAROM, Right, 5 reps, Supine Long Arc Quad: AROM, Right, 10 reps, Seated Knee Flexion: AROM, Right, 10 reps, Seated Goniometric ROM: R knee 5 to 80 degrees    General Comments   Educated on safe  ice use, no pivots, car transfers, resting with leg straight, and TED hose during day. Also, encouraged walking every 1-2 hours during day. Educated on HEP with focus on mobility the first weeks. Discussed doing exercises within pain control and if pain increasing could decreased ROM, reps, and stop exercises as needed. Encouraged to perform quad sets and ankle pumps frequently for blood flow and to promote full knee extension.      Pertinent Vitals/Pain Pain Assessment Pain Assessment: 0-10 Pain Score: 4  Pain Location: right knee Pain Descriptors / Indicators: Discomfort, Sore Pain Intervention(s): Limited activity within patient's tolerance, Monitored during session, Premedicated before session, Repositioned    Home Living                          Prior Function            PT Goals (current goals can now be found in the care plan section) Progress towards PT goals: Progressing toward goals    Frequency    7X/week      PT Plan      Co-evaluation              AM-PAC PT 6 Clicks Mobility   Outcome Measure  Help needed turning from your back to your side while in a flat bed without using bedrails?: A Little Help needed moving from lying on your back to sitting on the side of a flat bed without using bedrails?: A Little Help needed moving to and from a bed to a chair (including a wheelchair)?: A Little Help needed standing up from a chair using your arms (e.g., wheelchair or bedside chair)?: A Little Help needed to walk in hospital room?: A Little Help needed climbing 3-5 steps with a railing? : A Little 6 Click Score: 18    End of Session Equipment Utilized During Treatment: Gait belt Activity Tolerance: Patient tolerated treatment well Patient left: in bed;with call bell/phone within reach;with bed alarm set Nurse Communication: Mobility status PT Visit Diagnosis: Other abnormalities of gait and mobility (R26.89);Muscle weakness (generalized)  (M62.81)     Time: 8892-8857 PT Time Calculation (min) (ACUTE ONLY): 35 min  Charges:    $Gait Training: 8-22 mins $Therapeutic Exercise: 8-22 mins PT General Charges $$ ACUTE PT VISIT: 1 Visit                     Benjiman, PT Acute Rehab Services [facility] [phone]    Benjiman [name] 03/20/2024, 12:09 PM

## 2024-03-20 NOTE — Progress Notes (Signed)
 Discharge Medications delivered from TOC meds to bed [facility] outpatient pharmacy by this RN.

## 2024-03-20 NOTE — Progress Notes (Signed)
 Patient ID: Tracey Evans, female   DOB: 1945/07/04, 78 y.o.   MRN: [id number] Subjective: 3 Days Post-Op Procedures (LRB): ARTHROPLASTY, KNEE, TOTAL (Right)    Patient reports pain as moderate. Feeling better Received 2 units of PRBCs yesterday for ABLA Hgb stable Ready to mobilize and go home today  Objective:   VITALS:   Vitals:   03/20/24 0207 03/20/24 0537  BP: (!) 140/79 132/79  Pulse: 76 70  Resp: 16 20  Temp: 98.3 F (36.8 C) 97.6 F (36.4 C)  SpO2: 93% 98%    Neurovascular intact Incision: dressing C/D/I Examining her dressing today I saw that her aquacel dressing was beginning to peel back so I replaced it  LABS Recent Labs    03/18/24 0346 03/19/24 0343 03/20/24 0427  HGB 7.9* 7.1* 9.0*  HCT 25.0* 21.4* 27.5*  WBC 9.4 8.4  --   PLT 264 238  --     Recent Labs    03/18/24 0346  NA 137  K 4.4  BUN 18  CREATININE 0.98  GLUCOSE 157*    No results for input(s): LABPT, INR in the last 72 hours.   Assessment/Plan: 3 Days Post-Op Procedures (LRB): ARTHROPLASTY, KNEE, TOTAL (Right)   Up with therapy Home today after am therapy Outpatient PT already set up RTC in 2 weeks  ABLA: On iron supplement for 2-3 weeks Received 2 units of PRBCs  Hgb with stable increase to 9.0

## 2024-03-22 ENCOUNTER — Other Ambulatory Visit: Payer: Self-pay | Admitting: Family Medicine

## 2024-03-23 ENCOUNTER — Telehealth: Payer: Self-pay | Admitting: *Deleted

## 2024-03-23 DIAGNOSIS — M25561 Pain in right knee: Secondary | ICD-10-CM | POA: Diagnosis not present

## 2024-03-23 DIAGNOSIS — M25661 Stiffness of right knee, not elsewhere classified: Secondary | ICD-10-CM | POA: Diagnosis not present

## 2024-03-23 NOTE — Discharge Summary (Signed)
 Patient ID: Tracey Evans MRN: [id number] DOB/AGE: 1945/06/08 78 y.o.  Admit date: 03/17/2024 Discharge date: 03/20/2024  Admission Diagnoses:  Right knee osteoarthritis  Discharge Diagnoses:  Principal Problem:   S/P total knee arthroplasty, right   Past Medical History:  Diagnosis Date   Angina at rest    occurs whenever it wants to, but worse during agitation   Anxiety    Asthma    Binge eating disorder    Breast cancer of lower-outer quadrant of left female breast (HCC) 07/08/2015   NEVER HAD LEFT BREAST CANCER (08/15/2015)   Cancer of right breast (HCC) 08/15/2015   Chronic bronchitis (HCC)    Chronic lower back pain    Complication of anesthesia    had bronc spasms during intubation surgery 2009 on foot-need albuterol  inhaler or neb tx preop   Costochondritis    Depression    Diverticular disease    11-2020 bleed   Fibromyalgia    Hot flashes    Hyperlipidemia    Hypertension    Hypothyroidism    Osteoarthritis    all over   Peptic ulcer disease    Personal history of chemotherapy 2017   Pre-diabetes     Surgeries: Procedures: ARTHROPLASTY, KNEE, TOTAL on 03/17/2024   Consultants:   Discharged Condition: Improved  Hospital Course: Tracey Evans is an 78 y.o. female who was admitted 03/17/2024 for operative treatment ofS/P total knee arthroplasty, right. Patient has severe unremitting pain that affects sleep, daily activities, and work/hobbies. After pre-op clearance the patient was taken to the operating room on 03/17/2024 and underwent  Procedures: ARTHROPLASTY, KNEE, TOTAL.    Patient was given perioperative antibiotics:  Anti-infectives (From admission, onward)    Start     Dose/Rate Route Frequency Ordered Stop   03/17/24 1600  ceFAZolin  (ANCEF ) IVPB 2g/100 mL premix        2 g 200 mL/hr over 30 Minutes Intravenous Every 6 hours 03/17/24 1317 03/17/24 2200   03/17/24 0715  ceFAZolin  (ANCEF ) IVPB 2g/100 mL premix        2 g 200 mL/hr  over 30 Minutes Intravenous On call to O.R. 03/17/24 0701 03/17/24 1002        Patient was given sequential compression devices, early ambulation, and chemoprophylaxis to prevent DVT. She had postoperative ABLA but lost IV access so we unfortunately had to place a temporary PICC line in order to give 2 units PRBC. This was done without complication and her hgb improved. Patient worked with PT and was meeting their goals regarding safe ambulation and transfers.  Patient benefited maximally from hospital stay and there were no complications.    Recent vital signs: No data found.   Recent laboratory studies: No results for input(s): WBC, HGB, HCT, PLT, NA, K, CL, CO2, BUN, CREATININE, GLUCOSE, INR, CALCIUM  in the last 72 hours.  Invalid input(s): PT, 2   Discharge Medications:   Allergies as of 03/20/2024       Reactions   Contrast Media [iodinated Contrast Media] Shortness Of Breath   Patient has SOB, tightness in chest and feels like an elephant is sitting on chest, patient should be  scanned at hospital Per Dr. Wadie   Dilaudid  [hydromorphone  Hcl] Anaphylaxis   Pt stopped breathing   Adhesive [tape]    blister   Codeine  Nausea Only   insomnia   Lactose Intolerance (gi) Diarrhea   Latex Other (See Comments)   REACTION: burns skin- Latex tape only    Nsaids  Lyrica  [pregabalin ] Other (See Comments)   Mood swings.         Medication List     STOP taking these medications    diclofenac 75 MG EC tablet Commonly known as: VOLTAREN   traMADol  50 MG tablet Commonly known as: ULTRAM        TAKE these medications    acetaminophen  500 MG tablet Commonly known as: TYLENOL  Take 2 tablets (1,000 mg total) by mouth every 6 (six) hours.   amLODipine  5 MG tablet Commonly known as: NORVASC  Take 5 mg by mouth daily.   aspirin  81 MG chewable tablet Chew 1 tablet (81 mg total) by mouth 2 (two) times daily for 28 days.   atorvastatin  20 MG  tablet Commonly known as: LIPITOR TAKE 1 TABLET BY MOUTH EVERY DAY   benzonatate  100 MG capsule Commonly known as: TESSALON  Take 1 capsule (100 mg total) by mouth every 8 (eight) hours.   citalopram  20 MG tablet Commonly known as: CELEXA  Take 1 tablet (20 mg total) by mouth daily.   ferrous sulfate  325 (65 FE) MG tablet Take 1 tablet (325 mg total) by mouth 2 (two) times daily with a meal.   gabapentin  300 MG capsule Commonly known as: NEURONTIN  TAKE 1 CAPSULE IN THE MORNING , 1 IN AFTERNOON AND 2 CAPSULES BEFORE BED   levothyroxine  75 MCG tablet Commonly known as: SYNTHROID  TAKE 1 TABLET BY MOUTH EVERY DAY BEFORE BREAKFAST   losartan  100 MG tablet Commonly known as: COZAAR  Take 100 mg by mouth daily.   omeprazole  20 MG capsule Commonly known as: PRILOSEC TAKE 1 CAPSULE BY MOUTH EVERY DAY   oxyCODONE  5 MG immediate release tablet Commonly known as: Oxy IR/ROXICODONE  Take 1 tablet (5 mg total) by mouth every 4 (four) hours as needed for severe pain (pain score 7-10).   polyethylene glycol powder 17 GM/SCOOP powder Commonly known as: MiraLax  Take 17 g by mouth 2 (two) times daily as needed for mild constipation.   senna 8.6 MG Tabs tablet Commonly known as: SENOKOT Take 2 tablets (17.2 mg total) by mouth at bedtime.   Systane Complete PF 0.6 % Soln Generic drug: Propylene Glycol (PF) Place 1 drop into the right eye daily as needed (Eye irritation).   tizanidine  2 MG capsule Commonly known as: ZANAFLEX  Take 1-2 capsules (2-4 mg total) by mouth 3 (three) times daily as needed for muscle spasms. What changed:  how much to take when to take this               Discharge Care Instructions  (From admission, onward)           Start     Ordered   03/18/24 0000  Change dressing       Comments: Maintain surgical dressing until follow up in the clinic. If the edges start to pull up, may reinforce with tape. If the dressing is no longer working, may remove and  cover with gauze and tape, but must keep the area dry and clean.  Call with any questions or concerns.   03/18/24 0844            Diagnostic Studies: US  EKG SITE RITE Result Date: 03/19/2024 If Site Rite image not attached, placement could not be confirmed due to current cardiac rhythm.  VAS US  LOWER EXTREMITY VENOUS (DVT) (7a-7p) Result Date: 03/03/2024  Lower Venous DVT Study Patient Name:  GYNETH HUBKA  Date of Exam:   03/03/2024 Medical Rec #: [id number]  Accession #:    [id number] Date of Birth: [birth year]          Patient Gender: F Patient Age:   72 years Exam Location:  [facility] Procedure:      VAS US  LOWER EXTREMITY VENOUS (DVT) Referring Phys: LEITA MURPHY --------------------------------------------------------------------------------  Indications: Swelling, Edema, and Pre-op.  Comparison Study: Previous study on 5.7.2016. Performing Technologist: Edilia Elden Appl  Examination Guidelines: A complete evaluation includes B-mode imaging, spectral Doppler, color Doppler, and power Doppler as needed of all accessible portions of each vessel. Bilateral testing is considered an integral part of a complete examination. Limited examinations for reoccurring indications may be performed as noted. The reflux portion of the exam is performed with the patient in reverse Trendelenburg.  +---------+---------------+---------+-----------+----------+--------------+ RIGHT    CompressibilityPhasicitySpontaneityPropertiesThrombus Aging +---------+---------------+---------+-----------+----------+--------------+ CFV      Full           Yes      Yes                                 +---------+---------------+---------+-----------+----------+--------------+ SFJ      Full           Yes      Yes                                 +---------+---------------+---------+-----------+----------+--------------+ FV Prox  Full                                                         +---------+---------------+---------+-----------+----------+--------------+ FV Mid   Full                                                        +---------+---------------+---------+-----------+----------+--------------+ FV DistalFull                                                        +---------+---------------+---------+-----------+----------+--------------+ PFV      Full                                                        +---------+---------------+---------+-----------+----------+--------------+ POP      Full           Yes      Yes                                 +---------+---------------+---------+-----------+----------+--------------+ PTV      Full                                                        +---------+---------------+---------+-----------+----------+--------------+  PERO     Full                                                        +---------+---------------+---------+-----------+----------+--------------+   +----+---------------+---------+-----------+----------+--------------+ LEFTCompressibilityPhasicitySpontaneityPropertiesThrombus Aging +----+---------------+---------+-----------+----------+--------------+ CFV Full           Yes      Yes                                 +----+---------------+---------+-----------+----------+--------------+ SFJ Full           Yes      Yes                                 +----+---------------+---------+-----------+----------+--------------+     Summary: RIGHT: - There is no evidence of deep vein thrombosis in the lower extremity.  - No cystic structure found in the popliteal fossa.  LEFT: - No evidence of common femoral vein obstruction.   *See table(s) above for measurements and observations. Electronically signed by Lonni Gaskins MD on 03/03/2024 at 9:51:57 AM.    Final    CT Lumbar Spine Wo Contrast Result Date: 03/03/2024 CLINICAL DATA:  Low back pain. EXAM: CT LUMBAR SPINE  WITHOUT CONTRAST TECHNIQUE: Multidetector CT imaging of the lumbar spine was performed without intravenous contrast administration. Multiplanar CT image reconstructions were also generated. RADIATION DOSE REDUCTION: This exam was performed according to the departmental dose-optimization program which includes automated exposure control, adjustment of the mA and/or kV according to patient size and/or use of iterative reconstruction technique. COMPARISON:  None Available. FINDINGS: Segmentation: 5 lumbar type vertebrae. Alignment: Normal. Vertebrae: No acute fracture or focal pathologic process. Paraspinal and other soft tissues: 2.7 cm water  density lesion upper pole left kidney has been present since at least 2018 when it measured 1.7 cm, compatible with simple cyst. No followup imaging is recommended. 17 mm posterior left upper pole renal lesion also present since 2018 compatible with a cyst. There is mild atherosclerotic calcification of the abdominal aorta without aneurysm. Disc levels: Moderate to severe central canal stenosis at L3-4 due to posterior broad-based bulging disc and substantial facet overgrowth with thickening of the ligamentum flavum. Associated foraminal encroachment. 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 Moderate to advanced central canal stenosis evident L4-5 with probable foraminal encroachment. IMPRESSION: 1. No evidence for an acute or chronic lumbar spine fracture. 2. 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 3. Diffuse facet osteoarthritis in the lumbar spine bilaterally. 4. Lumbar spine MRI follow-up recommended. Electronically Signed   By: Camellia Candle M.D.   On: 03/03/2024 06:11    Disposition: Discharge disposition: 01-Home or Self Care       Discharge Instructions     Call MD / Call 911   Complete by: As directed    If you experience chest pain or shortness of breath, CALL 911 and be transported  to the hospital emergency room.  If you develope a fever above 101 F, pus (white drainage) or increased drainage or redness at the wound, or calf pain, call your surgeon's office.   Change dressing   Complete by: As directed    Maintain surgical dressing until follow  up in the clinic. If the edges start to pull up, may reinforce with tape. If the dressing is no longer working, may remove and cover with gauze and tape, but must keep the area dry and clean.  Call with any questions or concerns.   Constipation Prevention   Complete by: As directed    Drink plenty of fluids.  Prune juice may be helpful.  You may use a stool softener, such as Colace (over the counter) 100 mg twice a day.  Use MiraLax  (over the counter) for constipation as needed.   Diet - low sodium heart healthy   Complete by: As directed    Increase activity slowly as tolerated   Complete by: As directed    Weight bearing as tolerated with assist device (walker, cane, etc) as directed, use it as long as suggested by your surgeon or therapist, typically at least 4-6 weeks.   Post-operative opioid taper instructions:   Complete by: As directed    POST-OPERATIVE OPIOID TAPER INSTRUCTIONS: It is important to wean off of your opioid medication as soon as possible. If you do not need pain medication after your surgery it is ok to stop day one. Opioids include: Codeine , Hydrocodone (Norco, Vicodin), Oxycodone (Percocet, oxycontin ) and hydromorphone  amongst others.  Long term and even short term use of opiods can cause: Increased pain response Dependence Constipation Depression Respiratory depression And more.  Withdrawal symptoms can include Flu like symptoms Nausea, vomiting And more Techniques to manage these symptoms Hydrate well Eat regular healthy meals Stay active Use relaxation techniques(deep breathing, meditating, yoga) Do Not substitute Alcohol  to help with tapering If you have been on opioids for less than two weeks  and do not have pain than it is ok to stop all together.  Plan to wean off of opioids This plan should start within one week post op of your joint replacement. Maintain the same interval or time between taking each dose and first decrease the dose.  Cut the total daily intake of opioids by one tablet each day Next start to increase the time between doses. The last dose that should be eliminated is the evening dose.      TED hose   Complete by: As directed    Use stockings (TED hose) for 2 weeks on both leg(s).  You may remove them at night for sleeping.        Follow-up Information     [facility] Go on 03/20/2024.   Why: You are scheduled for physical therapy Friday 03/20/24 at 10:40am; [address] Contact information: [address] [place] [address] [phone]         Ernie Cough, MD. Go on 04/01/2024.   Specialty: Orthopedic Surgery Why: You are scheduled for a post op appointment on Wednesday 04/01/24 at 9:00am Contact information: [address] [place] [address] [place] [address] [phone]                  Signed: Rosina JONELLE Calin 03/23/2024, 4:25 PM

## 2024-03-23 NOTE — Transitions of Care (Post Inpatient/ED Visit) (Signed)
 03/23/2024  Name: Tracey Evans MRN: [id number] DOB: [birth year]  Today's TOC FU Call Status: Today's TOC FU Call Status:: Successful TOC FU Call Completed TOC FU Call Complete Date: 03/23/24  Patient's Name and Date of Birth confirmed. Name, DOB  Transition Care Management Follow-up Telephone Call Date of Discharge: 03/20/24 Discharge Facility: [facility]) Type of Discharge: Inpatient Admission Primary Inpatient Discharge Diagnosis:: Planned surgical (R) Total Knee replacement How have you been since you were released from the hospital?: Better (I am sore after the surgery but the pain medicine is working; I am doing fine- on my way to my first therapy appointment now; can't really talk, but don't feel like I need regular calls.  I will call Dr. Mahlon if anything comes up) Any questions or concerns?: No  Unable to complete full TOC assessments: patient declined full assessment: on her way to outpatient PT: reports she will call PCP office if needs arise  Items Reviewed: Did you receive and understand the discharge instructions provided?: Yes (Declines review: reports on way to outpatient PT session) Medications obtained,verified, and reconciled?: Partial Review Completed (Partial medication reconciliation/ review completed; confirmed self-manages medications and denies questions/ concerns around medications today) Reason for Partial Mediation Review: Patient declined: is currently in route to outpatient PT session Any new allergies since your discharge?: No Dietary orders reviewed?: No (Declined full TOC call) Do you have support at home?: Yes People in Home [RPT]: child(ren), adult Name of Support/Comfort Primary Source: Reports independent in self-care activities; resides with supportive adult daughter: assists as/ if needed/ indicated  Medications Reviewed Today: Medications Reviewed Today     Reviewed by Temari Schooler M, RN (Registered Nurse) on 03/23/24 at [phone]   Med List Status: <None>   Medication Order Taking? Sig Documenting Provider Last Dose Status Informant  acetaminophen  (TYLENOL ) 500 MG tablet [id number]  Take 2 tablets (1,000 mg total) by mouth every 6 (six) hours. Patti Rosina SAUNDERS, PA-C  Active   amLODipine  (NORVASC ) 5 MG tablet [id number]  Take 5 mg by mouth daily. [provider]  Active Self  aspirin  81 MG chewable tablet [id number]  Chew 1 tablet (81 mg total) by mouth 2 (two) times daily for 28 days. Patti Rosina SAUNDERS, PA-C  Active   atorvastatin  (LIPITOR) 20 MG tablet [id number]  TAKE 1 TABLET BY MOUTH EVERY DAY Tabori, Katherine E, MD  Active Self  benzonatate  (TESSALON ) 100 MG capsule [id number]  Take 1 capsule (100 mg total) by mouth every 8 (eight) hours. Glendia Rocky SAILOR, PA-C  Active Self  buPROPion  (WELLBUTRIN  XL) 300 MG 24 hr tablet [id number]  TAKE 1 TABLET BY MOUTH EVERY DAY Tabori, Katherine E, MD  Active Self  citalopram  (CELEXA ) 20 MG tablet [id number]  Take 1 tablet (20 mg total) by mouth daily. Tabori, Katherine E, MD  Active Self  ferrous sulfate  325 (65 FE) MG tablet [id number]  Take 1 tablet (325 mg total) by mouth 2 (two) times daily with a meal. Patti Rosina SAUNDERS, PA-C  Active   gabapentin  (NEURONTIN ) 300 MG capsule [id number]  TAKE 1 CAPSULE IN THE MORNING , 1 IN AFTERNOON AND 2 CAPSULES BEFORE BED Tabori, Katherine E, MD  Active Self  levothyroxine  (SYNTHROID ) 75 MCG tablet [id number]  TAKE 1 TABLET BY MOUTH EVERY DAY BEFORE BREAKFAST Tabori, Katherine E, MD  Active   losartan  (COZAAR ) 100 MG tablet [id number]  Take 100 mg by mouth daily. [provider]  Active Self  omeprazole  (PRILOSEC) 20 MG capsule [id number]  TAKE 1 CAPSULE BY MOUTH EVERY DAY Tabori, Katherine E, MD  Active   oxyCODONE  (OXY IR/ROXICODONE ) 5 MG immediate release tablet [id number] Yes Take 1 tablet (5 mg total) by mouth every 4 (four) hours as needed for severe pain (pain score 7-10). Patti Rosina SAUNDERS, PA-C  Active   polyethylene glycol powder  (MIRALAX ) 17 GM/SCOOP powder [id number]  Take 17 g by mouth 2 (two) times daily as needed for mild constipation. Gonfa, Taye T, MD  Active Self  Propylene Glycol, PF, (SYSTANE COMPLETE PF) 0.6 % SOLN [id number]  Place 1 drop into the right eye daily as needed (Eye irritation). [provider]  Active Self  senna (SENOKOT) 8.6 MG TABS tablet [id number]  Take 2 tablets (17.2 mg total) by mouth at bedtime. Patti Rosina SAUNDERS, PA-C  Active   tizanidine  (ZANAFLEX ) 2 MG capsule [id number] Yes Take 1-2 capsules (2-4 mg total) by mouth 3 (three) times daily as needed for muscle spasms. Patti Rosina SAUNDERS, PA-C  Active            Home Care and Equipment/Supplies: Were Home Health Services Ordered?: No Any new equipment or medical supplies ordered?: Yes (Rolling walker: confirmed obtained/ using) Name of Medical supply agency?: [employer] Were you able to get the equipment/medical supplies?: Yes Do you have any questions related to the use of the equipment/supplies?: No  Functional Questionnaire: Do you need assistance with bathing/showering or dressing?: No (daughter assists/ supervises after recent planned surgery: independent at baseline) Do you need assistance with meal preparation?: No (daughter assists/ supervises after recent planned surgery: independent at baseline) Do you need assistance with eating?: No Do you have difficulty maintaining continence: No Do you need assistance with getting out of bed/getting out of a chair/moving?: No Do you have difficulty managing or taking your medications?: No  Follow up appointments reviewed: PCP Follow-up appointment confirmed?: No (Declined care coordination outreach in real-time with scheduling care guide to schedule hospital follow up PCP appointment: I don't need to see Dr. Mahlon, this was a planned surgery and I have the appointment with the surgeon) MD Provider Line Number:[phone] Given: No (verified well-established with current  PCP) Specialist Hospital Follow-up appointment confirmed?: Yes Date of Specialist follow-up appointment?:  (I have an appointment already scheduled; I don't remember the exact date) Follow-Up Specialty Provider:: Surgical orthopedic provider Do you need transportation to your follow-up appointment?: No Do you understand care options if your condition(s) worsen?: Yes-patient verbalized understanding  SDOH Interventions Today    Flowsheet Row Most Recent Value  SDOH Interventions   Food Insecurity Interventions Patient Declined  [Patient declined full TOC call- on way to outpatient PT]  Housing Interventions Patient Declined  [Patient declined full TOC call- on way to outpatient PT]  Transportation Interventions Intervention Not Indicated  [drives self at baseline: daughter assisting after recent planned orthopedic surgery]  Utilities Interventions Patient Declined  [Patient declined full TOC call- on way to outpatient PT]   See TOC assessment tabs for additional assessment/ TOC intervention information  Patient declines need for ongoing/ further care management/ coordination outreach; declines enrollment in 30-day TOC program- declines taking my direct phone number should needs/ concerns arise post-TOC call   Pls call/ message for questions,  Emori Kamau Mckinney Bird Tailor, RN, BSN, Media Planner  Transitions of Care  [facility] [phone]: direct office

## 2024-03-25 DIAGNOSIS — M25561 Pain in right knee: Secondary | ICD-10-CM | POA: Diagnosis not present

## 2024-03-25 DIAGNOSIS — M25661 Stiffness of right knee, not elsewhere classified: Secondary | ICD-10-CM | POA: Diagnosis not present

## 2024-04-03 ENCOUNTER — Encounter: Payer: Self-pay | Admitting: Family Medicine

## 2024-04-03 NOTE — Telephone Encounter (Signed)
 Please see message below - patient should contact surgeons office?

## 2024-04-03 NOTE — Telephone Encounter (Signed)
 Called patient and she said that she will call them.

## 2024-04-15 ENCOUNTER — Other Ambulatory Visit: Payer: Self-pay | Admitting: Family Medicine

## 2024-04-15 DIAGNOSIS — M797 Fibromyalgia: Secondary | ICD-10-CM

## 2024-04-25 ENCOUNTER — Other Ambulatory Visit: Payer: Self-pay | Admitting: Family Medicine

## 2024-05-07 ENCOUNTER — Ambulatory Visit: Payer: PRIVATE HEALTH INSURANCE | Admitting: *Deleted

## 2024-05-07 VITALS — Ht 64.0 in | Wt 157.0 lb

## 2024-05-07 DIAGNOSIS — Z0000 Encounter for general adult medical examination without abnormal findings: Secondary | ICD-10-CM

## 2024-05-07 NOTE — Progress Notes (Signed)
 "  Chief Complaint  Patient presents with   Medicare Wellness     Subjective:   Tracey Evans is a 79 y.o. female who presents for a Medicare Annual Wellness Visit.  Visit info / Clinical Intake: Medicare Wellness Visit Type:: Subsequent Annual Wellness Visit Persons participating in visit and providing information:: patient Medicare Wellness Visit Mode:: Telephone If telephone:: video declined Since this visit was completed virtually, some vitals may be partially provided or unavailable. Missing vitals are due to the limitations of the virtual format.: Unable to obtain vitals - no equipment If Telephone or Video please confirm:: I connected with patient using audio/video enable telemedicine. I verified patient identity with two identifiers, discussed telehealth limitations, and patient agreed to proceed. Patient Location:: home Provider Location:: home Interpreter Needed?: No Pre-visit prep was completed: no AWV questionnaire completed by patient prior to visit?: no Living arrangements:: lives with spouse/significant other Patient's Overall Health Status Rating: good Typical amount of pain: some Does pain affect daily life?: no Are you currently prescribed opioids?: (!) yes  Dietary Habits and Nutritional Risks Eats fruit and vegetables daily?: yes Most meals are obtained by: eating out; preparing own meals In the last 2 weeks, have you had any of the following?: none Diabetic:: no  Functional Status Activities of Daily Living (to include ambulation/medication): Independent Ambulation: Independent Medication Administration: Independent Home Management (perform basic housework or laundry): Independent Manage your own finances?: yes Primary transportation is: family / friends; driving Concerns about vision?: no *vision screening is required for WT[facility]* Concerns about hearing?: no  Fall Screening Falls in the past year?: 0 Number of falls in past year: 0 Was there an  injury with Fall?: 0 Fall Risk Category Calculator: 0 Patient Fall Risk Level: Low Fall Risk  Fall Risk Patient at Risk for Falls Due to: Impaired balance/gait Fall risk Follow up: Falls evaluation completed; Education provided; Falls prevention discussed  Home and Transportation Safety: All rugs have non-skid backing?: N/A, no rugs All stairs or steps have railings?: N/A, no stairs Grab bars in the bathtub or shower?: yes Have non-skid surface in bathtub or shower?: (!) no Good home lighting?: yes Regular seat belt use?: yes Hospital stays in the last year:: (!) yes How many hospital stays:: 1 Reason: knee surgery  Cognitive Assessment Difficulty concentrating, remembering, or making decisions? : yes Will 6CIT or Mini Cog be Completed: yes What year is it?: 0 points What month is it?: 0 points Give patient an address phrase to remember (5 components): Its very sunny outside today in January About what time is it?: 0 points Count backwards from 20 to 1: 0 points Say the months of the year in reverse: 0 points Repeat the address phrase from earlier: 0 points 6 CIT Score: 0 points  Advance Directives (For Healthcare) Does Patient Have a Medical Advance Directive?: Yes Does patient want to make changes to medical advance directive?: No - Patient declined Type of Advance Directive: Healthcare Power of Attorney Copy of Healthcare Power of Attorney in Chart?: No - copy requested  Reviewed/Updated  Reviewed/Updated: Reviewed All (Medical, Surgical, Family, Medications, Allergies, Care Teams, Patient Goals); Surgical History; Family History; Medications; Allergies; Care Teams; Patient Goals; Medical History    Allergies (verified) Contrast media [iodinated contrast media], Dilaudid  [hydromorphone  hcl], Adhesive [tape], Codeine , Lactose intolerance (gi), Latex, Nsaids, and Lyrica  [pregabalin ]   Current Medications (verified) Outpatient Encounter Medications as of 05/07/2024   Medication Sig   acetaminophen  (TYLENOL ) 500 MG tablet Take 2 tablets (1,000  mg total) by mouth every 6 (six) hours.   amLODipine  (NORVASC ) 5 MG tablet Take 5 mg by mouth daily.   atorvastatin  (LIPITOR) 20 MG tablet TAKE 1 TABLET BY MOUTH EVERY DAY   buPROPion  (WELLBUTRIN  XL) 300 MG 24 hr tablet TAKE 1 TABLET BY MOUTH EVERY DAY   citalopram  (CELEXA ) 20 MG tablet Take 1 tablet (20 mg total) by mouth daily.   ferrous sulfate  325 (65 FE) MG tablet Take 1 tablet (325 mg total) by mouth 2 (two) times daily with a meal.   gabapentin  (NEURONTIN ) 300 MG capsule TAKE 1 CAPSULE IN THE MORNING , 1 IN AFTERNOON AND 2 CAPSULES BEFORE BED   levothyroxine  (SYNTHROID ) 75 MCG tablet TAKE 1 TABLET BY MOUTH EVERY DAY BEFORE BREAKFAST   losartan  (COZAAR ) 100 MG tablet Take 100 mg by mouth daily.   omeprazole  (PRILOSEC) 20 MG capsule TAKE 1 CAPSULE BY MOUTH EVERY DAY   oxyCODONE  (OXY IR/ROXICODONE ) 5 MG immediate release tablet Take 1 tablet (5 mg total) by mouth every 4 (four) hours as needed for severe pain (pain score 7-10).   polyethylene glycol powder (MIRALAX ) 17 GM/SCOOP powder Take 17 g by mouth 2 (two) times daily as needed for mild constipation.   predniSONE  (DELTASONE ) 5 MG tablet Take 5 mg by mouth daily with breakfast.   Propylene Glycol, PF, (SYSTANE COMPLETE PF) 0.6 % SOLN Place 1 drop into the right eye daily as needed (Eye irritation).   senna (SENOKOT) 8.6 MG TABS tablet Take 2 tablets (17.2 mg total) by mouth at bedtime.   tizanidine  (ZANAFLEX ) 2 MG capsule Take 1-2 capsules (2-4 mg total) by mouth 3 (three) times daily as needed for muscle spasms.   benzonatate  (TESSALON ) 100 MG capsule Take 1 capsule (100 mg total) by mouth every 8 (eight) hours.   No facility-administered encounter medications on file as of 05/07/2024.    History: Past Medical History:  Diagnosis Date   Angina at rest    occurs whenever it wants to, but worse during agitation   Anxiety    Asthma    Binge eating  disorder    Breast cancer of lower-outer quadrant of left female breast (HCC) 07/08/2015   NEVER HAD LEFT BREAST CANCER (08/15/2015)   Cancer of right breast (HCC) 08/15/2015   Chronic bronchitis (HCC)    Chronic lower back pain    Complication of anesthesia    had bronc spasms during intubation surgery 2009 on foot-need albuterol  inhaler or neb tx preop   Costochondritis    Depression    Diverticular disease    11-2020 bleed   Fibromyalgia    Hot flashes    Hyperlipidemia    Hypertension    Hypothyroidism    Osteoarthritis    all over   Peptic ulcer disease    Personal history of chemotherapy 2017   Pre-diabetes    Past Surgical History:  Procedure Laterality Date   ABDOMINAL HYSTERECTOMY     left my ovaries   ANKLE ARTHROSCOPY  01/02/2012   Procedure: ANKLE ARTHROSCOPY;  Surgeon: Deward DELENA Schwartz, MD;  Location: [facility];  Service: Orthopedics;  Laterality: Right;  right ankle arthroscopy with extensive debridement , dridement and drilling talar dome osteochondral lesion   ANKLE FRACTURE SURGERY Right 2009   Dr. Burnetta   BREAST BIOPSY  07/2015   CARDIAC CATHETERIZATION     CARPAL TUNNEL RELEASE Bilateral    CATARACT EXTRACTION, BILATERAL     COLONOSCOPY     DILATION AND  CURETTAGE OF UTERUS     before hysterectomy   FRACTURE SURGERY     KNEE ARTHROSCOPY Bilateral    LAPAROSCOPIC CHOLECYSTECTOMY     MASTECTOMY COMPLETE / SIMPLE W/ SENTINEL NODE BIOPSY Right 08/15/2015   MASTECTOMY W/ SENTINEL NODE BIOPSY Right 08/15/2015   Procedure: TOTAL MASTECTOMY WITH SENTINEL LYMPH NODE BIOPSY AND BLUE DYE INJECTION ;  Surgeon: Elon Pacini, MD;  Location: [facility];  Service: General;  Laterality: Right;   PORT-A-CATH REMOVAL N/A 11/12/2016   Procedure: REMOVAL PORT-A-CATH;  Surgeon: Lily Boas, MD;  [facility];  Service: General;  Laterality: N/A;   PORTACATH PLACEMENT Left 08/15/2015   PORTACATH PLACEMENT Left 08/15/2015   Procedure:  INSERTION PORT-A-CATH ;  Surgeon: Elon Pacini, MD;  Location: [facility];  Service: General;  Laterality: Left;   SHOULDER ARTHROSCOPY Bilateral 2009-2011   bone spurs   TONSILLECTOMY     TOTAL KNEE ARTHROPLASTY Right 03/17/2024   Procedure: ARTHROPLASTY, KNEE, TOTAL;  Surgeon: Ernie Cough, MD;  Location: [facility];  Service: Orthopedics;  Laterality: Right;   Family History  Problem Relation Age of Onset   Colon polyps Mother    Dementia Mother    Stroke Mother    Heart Problems Mother    Dementia Father    Alcohol  abuse Father    Colon cancer Father        dx. 74 or younger-pt states never confirmed   Breast cancer Sister 89       inflammatory   Diverticulitis Sister        maternal half-sister; severe - causing partial colectomy   Breast cancer Sister        maternal half-sister; dx. early 26s   Alcohol  abuse Brother    Colon polyps Brother    Breast cancer Other 20       niece   Breast cancer Other        niece dx. 50s   Esophageal cancer Neg Hx    Stomach cancer Neg Hx    Rectal cancer Neg Hx    Social History   Occupational History   Occupation: retired  Tobacco Use   Smoking status: Never    Passive exposure: Never   Smokeless tobacco: Never  Vaping Use   Vaping status: Never Used  Substance and Sexual Activity   Alcohol  use: Not Currently    Alcohol /week: 0.0 standard drinks of alcohol     Comment: 08/15/2015 1/2 glass of wine q 2 months    Drug use: No   Sexual activity: Not Currently    Comment: 1st intercourse- 14, partners- 4, divorced   Tobacco Counseling Counseling given: Not Answered  SDOH Screenings   Food Insecurity: No Food Insecurity (05/07/2024)  Housing: Unknown (05/07/2024)  Transportation Needs: No Transportation Needs (05/07/2024)  Utilities: Not At Risk (05/07/2024)  Alcohol  Screen: Low Risk (10/10/2022)  Depression (PHQ2-9): Low Risk (05/07/2024)  Financial Resource Strain: Low Risk (01/27/2024)  Physical Activity: Inactive (05/07/2024)   Social Connections: Moderately Integrated (05/07/2024)  Stress: No Stress Concern Present (05/07/2024)  Tobacco Use: Low Risk (05/07/2024)  Health Literacy: Adequate Health Literacy (05/07/2024)   See flowsheets for full screening details  Depression Screen Depression Screening Exception Documentation Depression Screening Exception:: Other- indicate reason in comment box Depression Screening Exception Comment:: Patient declined full TOC call- on way to outpatient PT  PHQ 2 & 9 Depression Scale- Over the past 2 weeks, how often have you been bothered by any of the following problems? Little interest or  pleasure in doing things: 2 Feeling down, depressed, or hopeless (PHQ Adolescent also includes...irritable): 1 PHQ-2 Total Score: 3 Trouble falling or staying asleep, or sleeping too much: 0 Feeling tired or having little energy: 0 Poor appetite or overeating (PHQ Adolescent also includes...weight loss): 0 Feeling bad about yourself - or that you are a failure or have let yourself or your family down: 0 Trouble concentrating on things, such as reading the newspaper or watching television (PHQ Adolescent also includes...like school work): 1 Moving or speaking so slowly that other people could have noticed. Or the opposite - being so fidgety or restless that you have been moving around a lot more than usual: 0 Thoughts that you would be better off dead, or of hurting yourself in some way: 0 PHQ-9 Total Score: 4 If you checked off any problems, how difficult have these problems made it for you to do your work, take care of things at home, or get along with other people?: Not difficult at all     Goals Addressed             This Visit's Progress    Patient Stated       Stay alive             Objective:    Today's Vitals   05/07/24 0837  Weight: 157 lb (71.2 kg)  Height: 5' 4 (1.626 m)   Body mass index is 26.95 kg/m.  Hearing/Vision screen Hearing Screening - Comments::  No trouble hearing Vision Screening - Comments:: Not up to date Education provided Immunizations and Health Maintenance Health Maintenance  Topic Date Due   COVID-19 Vaccine (5 - 2025-26 season) 12/09/2023   FOOT EXAM  04/11/2024   Diabetic kidney evaluation - Urine ACR  05/24/2024   OPHTHALMOLOGY EXAM  06/13/2024   HEMOGLOBIN A1C  09/08/2024   Mammogram  12/22/2024   Diabetic kidney evaluation - eGFR measurement  03/18/2025   Medicare Annual Wellness (AWV)  05/07/2025   DTaP/Tdap/Td (3 - Td or Tdap) 10/25/2029   Colonoscopy  04/20/2031   Pneumococcal Vaccine: 50+ Years  Completed   Influenza Vaccine  Completed   Bone Density Scan  Completed   Hepatitis C Screening  Completed   Zoster Vaccines- Shingrix  Completed   Meningococcal B Vaccine  Aged Out        Assessment/Plan:  This is a routine wellness examination for Missi.  Patient Care Team: Mahlon Comer BRAVO, MD as PCP - General (Family Medicine) Levern Hutching, MD as Consulting Physician (Cardiology) Odean Potts, MD as Consulting Physician (Hematology and Oncology) Dewey Rush, MD as Consulting Physician (Radiation Oncology) Gail Favorite, MD as Consulting Physician (General Surgery) Ortho, [facility] (Specialist) Bonner Ade, MD as Consulting Physician (Physical Medicine and Rehabilitation) Crawford, Morna Pickle, NP as Nurse Practitioner (Hematology and Oncology)  I have personally reviewed and noted the following in the patients chart:   Medical and social history Use of alcohol , tobacco or illicit drugs  Current medications and supplements including opioid prescriptions. Functional ability and status Nutritional status Physical activity Advanced directives List of other physicians Hospitalizations, surgeries, and ER visits in previous 12 months Vitals Screenings to include cognitive, depression, and falls Referrals and appointments  No orders of the defined types were placed in this  encounter.  In addition, I have reviewed and discussed with patient certain preventive protocols, quality metrics, and best practice recommendations. A written personalized care plan for preventive services as well as general preventive health recommendations were provided to  patient.   Mliss Graff, LPN   [id number]/70/7973   Return in 1 year (on 05/07/2025).  After Visit Summary: (Mail) Due to this being a telephonic visit, the after visit summary with patients personalized plan was offered to patient via mail   Nurse Notes:  "

## 2024-05-07 NOTE — Patient Instructions (Signed)
 Tracey Evans,  Thank you for taking the time for your Medicare Wellness Visit. I appreciate your continued commitment to your health goals. Please review the care plan we discussed, and feel free to reach out if I can assist you further.  Please note that Annual Wellness Visits do not include a physical exam. Some assessments may be limited, especially if the visit was conducted virtually. If needed, we may recommend an in-person follow-up with your provider.  Ongoing Care Seeing your primary care provider every 3 to 6 months helps us  monitor your health and provide consistent, personalized care.   Referrals If a referral was made during today's visit and you haven't received any updates within two weeks, please contact the referred provider directly to check on the status.  Recommended Screenings:  Health Maintenance  Topic Date Due   COVID-19 Vaccine (5 - 2025-26 season) 12/09/2023   Complete foot exam   04/11/2024   Kidney health urinalysis for diabetes  05/24/2024   Eye exam for diabetics  06/13/2024   Hemoglobin A1C  09/08/2024   Breast Cancer Screening  12/22/2024   Yearly kidney function blood test for diabetes  03/18/2025   Medicare Annual Wellness Visit  05/07/2025   DTaP/Tdap/Td vaccine (3 - Td or Tdap) 10/25/2029   Colon Cancer Screening  04/20/2031   Pneumococcal Vaccine for age over 19  Completed   Flu Shot  Completed   Osteoporosis screening with Bone Density Scan  Completed   Hepatitis C Screening  Completed   Zoster (Shingles) Vaccine  Completed   Meningitis B Vaccine  Aged Out       05/07/2024    8:44 AM  Advanced Directives  Does Patient Have a Medical Advance Directive? Yes  Type of Advance Directive Healthcare Power of Attorney  Copy of Healthcare Power of Attorney in Chart? No - copy requested    Vision: Annual vision screenings are recommended for early detection of glaucoma, cataracts, and diabetic retinopathy. These exams can also reveal signs of  chronic conditions such as diabetes and high blood pressure.  Dental: Annual dental screenings help detect early signs of oral cancer, gum disease, and other conditions linked to overall health, including heart disease and diabetes.  Please see the attached documents for additional preventive care recommendations.    Tracey Evans , Thank you for taking time to come for your Medicare Wellness Visit. I appreciate your ongoing commitment to your health goals. Please review the following plan we discussed and let me know if I can assist you in the future.   Screening recommendations/referrals: Colonoscopy:  Mammogram:  Bone Density:  Recommended yearly ophthalmology/optometry visit for glaucoma screening and checkup Recommended yearly dental visit for hygiene and checkup  Vaccinations: Influenza vaccine:  Pneumococcal vaccine:  Tdap vaccine:  Shingles vaccine:        Preventive Care 65 Years and Older, Female Preventive care refers to lifestyle choices and visits with your health care provider that can promote health and wellness. What does preventive care include? A yearly physical exam. This is also called an annual well check. Dental exams once or twice a year. Routine eye exams. Ask your health care provider how often you should have your eyes checked. Personal lifestyle choices, including: Daily care of your teeth and gums. Regular physical activity. Eating a healthy diet. Avoiding tobacco and drug use. Limiting alcohol  use. Practicing safe sex. Taking low-dose aspirin  every day. Taking vitamin and mineral supplements as recommended by your health care provider. What happens during  an annual well check? The services and screenings done by your health care provider during your annual well check will depend on your age, overall health, lifestyle risk factors, and family history of disease. Counseling  Your health care provider may ask you questions about your: Alcohol   use. Tobacco use. Drug use. Emotional well-being. Home and relationship well-being. Sexual activity. Eating habits. History of falls. Memory and ability to understand (cognition). Work and work astronomer. Reproductive health. Screening  You may have the following tests or measurements: Height, weight, and BMI. Blood pressure. Lipid and cholesterol levels. These may be checked every 5 years, or more frequently if you are over 62 years old. Skin check. Lung cancer screening. You may have this screening every year starting at age 10 if you have a 30-pack-year history of smoking and currently smoke or have quit within the past 15 years. Fecal occult blood test (FOBT) of the stool. You may have this test every year starting at age 58. Flexible sigmoidoscopy or colonoscopy. You may have a sigmoidoscopy every 5 years or a colonoscopy every 10 years starting at age 45. Hepatitis C blood test. Hepatitis B blood test. Sexually transmitted disease (STD) testing. Diabetes screening. This is done by checking your blood sugar (glucose) after you have not eaten for a while (fasting). You may have this done every 1-3 years. Bone density scan. This is done to screen for osteoporosis. You may have this done starting at age 51. Mammogram. This may be done every 1-2 years. Talk to your health care provider about how often you should have regular mammograms. Talk with your health care provider about your test results, treatment options, and if necessary, the need for more tests. Vaccines  Your health care provider may recommend certain vaccines, such as: Influenza vaccine. This is recommended every year. Tetanus, diphtheria, and acellular pertussis (Tdap, Td) vaccine. You may need a Td booster every 10 years. Zoster vaccine. You may need this after age 102. Pneumococcal 13-valent conjugate (PCV13) vaccine. One dose is recommended after age 36. Pneumococcal polysaccharide (PPSV23) vaccine. One dose is  recommended after age 41. Talk to your health care provider about which screenings and vaccines you need and how often you need them. This information is not intended to replace advice given to you by your health care provider. Make sure you discuss any questions you have with your health care provider. Document Released: 04/22/2015 Document Revised: 12/14/2015 Document Reviewed: 01/25/2015 Elsevier Interactive Patient Education  2017 [employer].  Fall Prevention in the Home Falls can cause injuries. They can happen to people of all ages. There are many things you can do to make your home safe and to help prevent falls. What can I do on the outside of my home? Regularly fix the edges of walkways and driveways and fix any cracks. Remove anything that might make you trip as you walk through a door, such as a raised step or threshold. Trim any bushes or trees on the path to your home. Use bright outdoor lighting. Clear any walking paths of anything that might make someone trip, such as rocks or tools. Regularly check to see if handrails are loose or broken. Make sure that both sides of any steps have handrails. Any raised decks and porches should have guardrails on the edges. Have any leaves, snow, or ice cleared regularly. Use sand or salt on walking paths during winter. Clean up any spills in your garage right away. This includes oil or grease spills. What can  I do in the bathroom? Use night lights. Install grab bars by the toilet and in the tub and shower. Do not use towel bars as grab bars. Use non-skid mats or decals in the tub or shower. If you need to sit down in the shower, use a plastic, non-slip stool. Keep the floor dry. Clean up any water  that spills on the floor as soon as it happens. Remove soap buildup in the tub or shower regularly. Attach bath mats securely with double-sided non-slip rug tape. Do not have throw rugs and other things on the floor that can make you  trip. What can I do in the bedroom? Use night lights. Make sure that you have a light by your bed that is easy to reach. Do not use any sheets or blankets that are too big for your bed. They should not hang down onto the floor. Have a firm chair that has side arms. You can use this for support while you get dressed. Do not have throw rugs and other things on the floor that can make you trip. What can I do in the kitchen? Clean up any spills right away. Avoid walking on wet floors. Keep items that you use a lot in easy-to-reach places. If you need to reach something above you, use a strong step stool that has a grab bar. Keep electrical cords out of the way. Do not use floor polish or wax that makes floors slippery. If you must use wax, use non-skid floor wax. Do not have throw rugs and other things on the floor that can make you trip. What can I do with my stairs? Do not leave any items on the stairs. Make sure that there are handrails on both sides of the stairs and use them. Fix handrails that are broken or loose. Make sure that handrails are as long as the stairways. Check any carpeting to make sure that it is firmly attached to the stairs. Fix any carpet that is loose or worn. Avoid having throw rugs at the top or bottom of the stairs. If you do have throw rugs, attach them to the floor with carpet tape. Make sure that you have a light switch at the top of the stairs and the bottom of the stairs. If you do not have them, ask someone to add them for you. What else can I do to help prevent falls? Wear shoes that: Do not have high heels. Have rubber bottoms. Are comfortable and fit you well. Are closed at the toe. Do not wear sandals. If you use a stepladder: Make sure that it is fully opened. Do not climb a closed stepladder. Make sure that both sides of the stepladder are locked into place. Ask someone to hold it for you, if possible. Clearly mark and make sure that you can  see: Any grab bars or handrails. First and last steps. Where the edge of each step is. Use tools that help you move around (mobility aids) if they are needed. These include: Canes. Walkers. Scooters. Crutches. Turn on the lights when you go into a dark area. Replace any light bulbs as soon as they burn out. Set up your furniture so you have a clear path. Avoid moving your furniture around. If any of your floors are uneven, fix them. If there are any pets around you, be aware of where they are. Review your medicines with your doctor. Some medicines can make you feel dizzy. This can increase your chance of falling.  Ask your doctor what other things that you can do to help prevent falls. This information is not intended to replace advice given to you by your health care provider. Make sure you discuss any questions you have with your health care provider. Document Released: 01/20/2009 Document Revised: 09/01/2015 Document Reviewed: 04/30/2014 Elsevier Interactive Patient Education  2017 [employer].

## 2024-05-14 ENCOUNTER — Telehealth: Payer: Self-pay

## 2024-05-14 NOTE — Telephone Encounter (Signed)
Called patient and verbalized understanding  

## 2024-05-14 NOTE — Telephone Encounter (Signed)
 FYI  Copied from CRM #[id number]. Topic: Clinical - Medical Advice >> May 14, 2024  9:38 AM Drema MATSU wrote: Reason for CRM: Pt had a knee replacement and has her on 10 day regime of prednisone . She is eating a quart of ice cream a day and any snack she can get in her hand. Tomorrow is her last day of prednisone . She does noy know what her blood sugar is. She stays hungry. She just wants to give her a heads up.

## 2024-05-14 NOTE — Telephone Encounter (Signed)
 Hopefully her hunger will subside once she is off the prednisone .  But she should try and limit her carb/sugar intake

## 2024-05-29 ENCOUNTER — Ambulatory Visit: Admitting: Family Medicine
